# Patient Record
Sex: Male | Born: 1940 | Race: White | Hispanic: No | Marital: Married | Smoking: Former smoker
Health system: Southern US, Community
[De-identification: ages and names within clinical notes are randomized; demographics above are authoritative.]

## PROBLEM LIST (undated history)

## (undated) DIAGNOSIS — R0602 Shortness of breath: Secondary | ICD-10-CM

## (undated) DIAGNOSIS — I4891 Unspecified atrial fibrillation: Secondary | ICD-10-CM

## (undated) DIAGNOSIS — F329 Major depressive disorder, single episode, unspecified: Secondary | ICD-10-CM

## (undated) DIAGNOSIS — G893 Neoplasm related pain (acute) (chronic): Secondary | ICD-10-CM

## (undated) DIAGNOSIS — Z87891 Personal history of nicotine dependence: Secondary | ICD-10-CM

## (undated) DIAGNOSIS — J910 Malignant pleural effusion: Secondary | ICD-10-CM

## (undated) DIAGNOSIS — G629 Polyneuropathy, unspecified: Secondary | ICD-10-CM

## (undated) DIAGNOSIS — K55069 Acute infarction of intestine, part and extent unspecified: Secondary | ICD-10-CM

## (undated) DIAGNOSIS — J189 Pneumonia, unspecified organism: Secondary | ICD-10-CM

## (undated) DIAGNOSIS — Z789 Other specified health status: Secondary | ICD-10-CM

## (undated) DIAGNOSIS — K760 Fatty (change of) liver, not elsewhere classified: Secondary | ICD-10-CM

## (undated) DIAGNOSIS — C220 Liver cell carcinoma: Secondary | ICD-10-CM

## (undated) DIAGNOSIS — R188 Other ascites: Secondary | ICD-10-CM

## (undated) DIAGNOSIS — M1990 Unspecified osteoarthritis, unspecified site: Secondary | ICD-10-CM

## (undated) DIAGNOSIS — H35033 Hypertensive retinopathy, bilateral: Secondary | ICD-10-CM

## (undated) DIAGNOSIS — C3492 Malignant neoplasm of unspecified part of left bronchus or lung: Secondary | ICD-10-CM

## (undated) DIAGNOSIS — Z86010 Personal history of colonic polyps: Secondary | ICD-10-CM

## (undated) DIAGNOSIS — J181 Lobar pneumonia, unspecified organism: Secondary | ICD-10-CM

## (undated) DIAGNOSIS — I639 Cerebral infarction, unspecified: Secondary | ICD-10-CM

## (undated) DIAGNOSIS — I10 Essential (primary) hypertension: Secondary | ICD-10-CM

## (undated) DIAGNOSIS — R011 Cardiac murmur, unspecified: Secondary | ICD-10-CM

## (undated) DIAGNOSIS — F32A Depression, unspecified: Secondary | ICD-10-CM

## (undated) DIAGNOSIS — K219 Gastro-esophageal reflux disease without esophagitis: Secondary | ICD-10-CM

## (undated) DIAGNOSIS — M545 Low back pain, unspecified: Secondary | ICD-10-CM

## (undated) DIAGNOSIS — K5900 Constipation, unspecified: Secondary | ICD-10-CM

## (undated) DIAGNOSIS — Z8639 Personal history of other endocrine, nutritional and metabolic disease: Secondary | ICD-10-CM

## (undated) DIAGNOSIS — E785 Hyperlipidemia, unspecified: Secondary | ICD-10-CM

## (undated) DIAGNOSIS — R768 Other specified abnormal immunological findings in serum: Secondary | ICD-10-CM

## (undated) DIAGNOSIS — H3530 Unspecified macular degeneration: Secondary | ICD-10-CM

## (undated) DIAGNOSIS — E11319 Type 2 diabetes mellitus with unspecified diabetic retinopathy without macular edema: Secondary | ICD-10-CM

## (undated) DIAGNOSIS — K5730 Diverticulosis of large intestine without perforation or abscess without bleeding: Secondary | ICD-10-CM

## (undated) DIAGNOSIS — I499 Cardiac arrhythmia, unspecified: Secondary | ICD-10-CM

## (undated) HISTORY — DX: Malignant neoplasm of unspecified part of left bronchus or lung: C34.92

## (undated) HISTORY — DX: Other ascites: R18.8

## (undated) HISTORY — DX: Pneumonia, unspecified organism: J18.9

## (undated) HISTORY — DX: Low back pain: M54.5

## (undated) HISTORY — DX: Personal history of colonic polyps: Z86.010

## (undated) HISTORY — DX: Personal history of other endocrine, nutritional and metabolic disease: Z86.39

## (undated) HISTORY — DX: Cardiac murmur, unspecified: R01.1

## (undated) HISTORY — DX: Lobar pneumonia, unspecified organism: J18.1

## (undated) HISTORY — DX: Neoplasm related pain (acute) (chronic): G89.3

## (undated) HISTORY — DX: Fatty (change of) liver, not elsewhere classified: K76.0

## (undated) HISTORY — DX: Hyperlipidemia, unspecified: E78.5

## (undated) HISTORY — DX: Other specified abnormal immunological findings in serum: R76.8

## (undated) HISTORY — DX: Diverticulosis of large intestine without perforation or abscess without bleeding: K57.30

## (undated) HISTORY — DX: Acute infarction of intestine, part and extent unspecified: K55.069

## (undated) HISTORY — DX: Unspecified macular degeneration: H35.30

## (undated) HISTORY — DX: Essential (primary) hypertension: I10

## (undated) HISTORY — DX: Liver cell carcinoma: C22.0

## (undated) HISTORY — DX: Malignant pleural effusion: J91.0

## (undated) HISTORY — DX: Type 2 diabetes mellitus with unspecified diabetic retinopathy without macular edema: E11.319

## (undated) HISTORY — DX: Personal history of nicotine dependence: Z87.891

## (undated) HISTORY — DX: Other specified health status: Z78.9

## (undated) HISTORY — DX: Low back pain, unspecified: M54.50

## (undated) HISTORY — DX: Cerebral infarction, unspecified: I63.9

## (undated) HISTORY — DX: Hypertensive retinopathy, bilateral: H35.033

---

## 1999-12-24 ENCOUNTER — Encounter: Payer: Self-pay | Admitting: Family Medicine

## 2000-02-29 DIAGNOSIS — K760 Fatty (change of) liver, not elsewhere classified: Secondary | ICD-10-CM

## 2000-02-29 HISTORY — DX: Fatty (change of) liver, not elsewhere classified: K76.0

## 2001-02-20 ENCOUNTER — Encounter: Payer: Self-pay | Admitting: Family Medicine

## 2001-10-23 ENCOUNTER — Encounter: Payer: Self-pay | Admitting: Family Medicine

## 2001-10-23 LAB — CONVERTED CEMR LAB: Hgb A1c MFr Bld: 5.4 %

## 2002-09-22 ENCOUNTER — Encounter: Payer: Self-pay | Admitting: Family Medicine

## 2002-12-23 HISTORY — PX: COLONOSCOPY: SHX174

## 2003-04-23 ENCOUNTER — Encounter: Payer: Self-pay | Admitting: Family Medicine

## 2003-06-23 ENCOUNTER — Ambulatory Visit (HOSPITAL_COMMUNITY): Admission: RE | Admit: 2003-06-23 | Discharge: 2003-06-23 | Payer: Self-pay | Admitting: Gastroenterology

## 2004-06-22 ENCOUNTER — Encounter: Payer: Self-pay | Admitting: Family Medicine

## 2004-06-22 LAB — CONVERTED CEMR LAB
Hgb A1c MFr Bld: 6.1 %
Hgb A1c MFr Bld: 6.1 %

## 2004-10-23 ENCOUNTER — Encounter: Payer: Self-pay | Admitting: Family Medicine

## 2004-10-23 LAB — CONVERTED CEMR LAB
Microalbumin U total vol: 0.2 mg/L
PSA: 0.71 ng/mL
PSA: 0.71 ng/mL

## 2004-11-09 ENCOUNTER — Ambulatory Visit: Payer: Self-pay | Admitting: Family Medicine

## 2004-11-13 ENCOUNTER — Ambulatory Visit: Payer: Self-pay | Admitting: Family Medicine

## 2004-12-07 ENCOUNTER — Ambulatory Visit: Payer: Self-pay | Admitting: Internal Medicine

## 2004-12-21 ENCOUNTER — Ambulatory Visit: Payer: Self-pay | Admitting: Family Medicine

## 2005-02-05 ENCOUNTER — Ambulatory Visit: Payer: Self-pay | Admitting: Family Medicine

## 2005-03-19 ENCOUNTER — Ambulatory Visit: Payer: Self-pay | Admitting: Family Medicine

## 2005-10-23 ENCOUNTER — Encounter: Payer: Self-pay | Admitting: Family Medicine

## 2005-10-24 ENCOUNTER — Ambulatory Visit: Payer: Self-pay | Admitting: Family Medicine

## 2005-11-01 ENCOUNTER — Ambulatory Visit: Payer: Self-pay | Admitting: Family Medicine

## 2005-11-05 ENCOUNTER — Ambulatory Visit: Payer: Self-pay | Admitting: Family Medicine

## 2006-01-23 ENCOUNTER — Encounter: Payer: Self-pay | Admitting: Family Medicine

## 2006-02-07 ENCOUNTER — Ambulatory Visit: Payer: Self-pay | Admitting: Family Medicine

## 2006-02-14 ENCOUNTER — Ambulatory Visit: Payer: Self-pay | Admitting: Internal Medicine

## 2006-05-02 ENCOUNTER — Ambulatory Visit: Payer: Self-pay | Admitting: Family Medicine

## 2006-08-08 ENCOUNTER — Ambulatory Visit: Payer: Self-pay | Admitting: Family Medicine

## 2006-08-12 ENCOUNTER — Ambulatory Visit: Payer: Self-pay | Admitting: Family Medicine

## 2006-10-01 ENCOUNTER — Ambulatory Visit: Payer: Self-pay | Admitting: Family Medicine

## 2006-10-23 ENCOUNTER — Encounter: Payer: Self-pay | Admitting: Family Medicine

## 2006-10-23 LAB — CONVERTED CEMR LAB
Hgb A1c MFr Bld: 5.4 %
Microalbumin U total vol: 16.5 mg/L
PSA: 0.9 ng/mL

## 2006-10-31 ENCOUNTER — Ambulatory Visit: Payer: Self-pay | Admitting: Internal Medicine

## 2006-11-07 ENCOUNTER — Ambulatory Visit: Payer: Self-pay | Admitting: Family Medicine

## 2007-04-16 ENCOUNTER — Ambulatory Visit: Payer: Self-pay | Admitting: Internal Medicine

## 2007-04-28 ENCOUNTER — Telehealth (INDEPENDENT_AMBULATORY_CARE_PROVIDER_SITE_OTHER): Payer: Self-pay | Admitting: Internal Medicine

## 2007-06-09 ENCOUNTER — Encounter: Payer: Self-pay | Admitting: Family Medicine

## 2007-06-09 DIAGNOSIS — E099 Drug or chemical induced diabetes mellitus without complications: Secondary | ICD-10-CM

## 2007-06-09 DIAGNOSIS — Z87891 Personal history of nicotine dependence: Secondary | ICD-10-CM

## 2007-06-09 DIAGNOSIS — K5730 Diverticulosis of large intestine without perforation or abscess without bleeding: Secondary | ICD-10-CM | POA: Insufficient documentation

## 2007-06-09 DIAGNOSIS — K649 Unspecified hemorrhoids: Secondary | ICD-10-CM

## 2007-06-09 DIAGNOSIS — T380X5A Adverse effect of glucocorticoids and synthetic analogues, initial encounter: Secondary | ICD-10-CM

## 2007-06-09 DIAGNOSIS — K7689 Other specified diseases of liver: Secondary | ICD-10-CM

## 2007-06-09 DIAGNOSIS — I10 Essential (primary) hypertension: Secondary | ICD-10-CM | POA: Insufficient documentation

## 2007-06-09 DIAGNOSIS — E785 Hyperlipidemia, unspecified: Secondary | ICD-10-CM

## 2007-06-11 ENCOUNTER — Ambulatory Visit: Payer: Self-pay | Admitting: Family Medicine

## 2007-11-06 ENCOUNTER — Ambulatory Visit: Payer: Self-pay | Admitting: Family Medicine

## 2007-11-08 LAB — CONVERTED CEMR LAB
ALT: 47 units/L (ref 0–53)
Bilirubin, Direct: 0.1 mg/dL (ref 0.0–0.3)
CO2: 32 meq/L (ref 19–32)
Calcium: 9.1 mg/dL (ref 8.4–10.5)
Cholesterol: 160 mg/dL (ref 0–200)
GFR calc Af Amer: 124 mL/min
Glucose, Bld: 138 mg/dL — ABNORMAL HIGH (ref 70–99)
Microalb Creat Ratio: 21 mg/g (ref 0.0–30.0)
Total CHOL/HDL Ratio: 3.1
Total Protein: 6.6 g/dL (ref 6.0–8.3)
Triglycerides: 103 mg/dL (ref 0–149)

## 2007-11-10 ENCOUNTER — Ambulatory Visit: Payer: Self-pay | Admitting: Family Medicine

## 2007-12-30 ENCOUNTER — Telehealth: Payer: Self-pay | Admitting: Family Medicine

## 2008-04-22 ENCOUNTER — Ambulatory Visit: Payer: Self-pay | Admitting: Family Medicine

## 2008-08-17 ENCOUNTER — Encounter: Payer: Self-pay | Admitting: Family Medicine

## 2008-09-19 ENCOUNTER — Ambulatory Visit: Payer: Self-pay | Admitting: Family Medicine

## 2008-11-01 ENCOUNTER — Telehealth: Payer: Self-pay | Admitting: Family Medicine

## 2008-12-23 DIAGNOSIS — R011 Cardiac murmur, unspecified: Secondary | ICD-10-CM

## 2008-12-23 HISTORY — DX: Cardiac murmur, unspecified: R01.1

## 2008-12-26 ENCOUNTER — Ambulatory Visit: Payer: Self-pay | Admitting: Family Medicine

## 2008-12-26 LAB — CONVERTED CEMR LAB
ALT: 77 units/L — ABNORMAL HIGH (ref 0–53)
Alkaline Phosphatase: 57 units/L (ref 39–117)
BUN: 12 mg/dL (ref 6–23)
CO2: 32 meq/L (ref 19–32)
Chloride: 103 meq/L (ref 96–112)
Creatinine,U: 110.8 mg/dL
Direct LDL: 160.5 mg/dL
Eosinophils Relative: 5.9 % — ABNORMAL HIGH (ref 0.0–5.0)
Glucose, Bld: 163 mg/dL — ABNORMAL HIGH (ref 70–99)
HCT: 50.6 % (ref 39.0–52.0)
HDL: 44.2 mg/dL (ref >39.0)
Hgb A1c MFr Bld: 6.1 % — ABNORMAL HIGH (ref 4.6–6.0)
Lymphocytes Relative: 25.1 % (ref 12.0–46.0)
Microalb Creat Ratio: 15.3 mg/g (ref 0.0–30.0)
Microalb, Ur: 1.7 mg/dL (ref 0.0–1.9)
Monocytes Relative: 8.1 % (ref 3.0–12.0)
Neutrophils Relative %: 60.9 % (ref 43.0–77.0)
Platelets: 131 10*3/uL — ABNORMAL LOW (ref 150–400)
Potassium: 4.7 meq/L (ref 3.5–5.1)
Total CHOL/HDL Ratio: 5.4
Triglycerides: 191 mg/dL — ABNORMAL HIGH (ref 0–149)
VLDL: 38 mg/dL (ref 0–40)
WBC: 5.5 10*3/uL (ref 4.5–10.5)

## 2008-12-28 ENCOUNTER — Ambulatory Visit: Payer: Self-pay | Admitting: Family Medicine

## 2008-12-28 LAB — HM DIABETES FOOT EXAM

## 2008-12-29 ENCOUNTER — Ambulatory Visit: Payer: Self-pay | Admitting: Family Medicine

## 2009-02-21 ENCOUNTER — Ambulatory Visit: Payer: Self-pay | Admitting: Family Medicine

## 2009-02-26 LAB — CONVERTED CEMR LAB
Albumin: 4.1 g/dL (ref 3.5–5.2)
Alkaline Phosphatase: 58 units/L (ref 39–117)
BUN: 19 mg/dL (ref 6–23)
Creatinine, Ser: 0.9 mg/dL (ref 0.4–1.5)
GFR calc Af Amer: 108 mL/min
Glucose, Bld: 128 mg/dL — ABNORMAL HIGH (ref 70–99)
Potassium: 4.6 meq/L (ref 3.5–5.1)
Total Protein: 6.3 g/dL (ref 6.0–8.3)

## 2009-02-28 ENCOUNTER — Ambulatory Visit: Payer: Self-pay | Admitting: Family Medicine

## 2009-04-03 ENCOUNTER — Ambulatory Visit: Payer: Self-pay | Admitting: Family Medicine

## 2009-04-03 LAB — CONVERTED CEMR LAB: ALT: 28 units/L (ref 0–53)

## 2009-05-23 ENCOUNTER — Ambulatory Visit: Payer: Self-pay | Admitting: Family Medicine

## 2009-05-23 LAB — CONVERTED CEMR LAB
AST: 26 units/L (ref 0–37)
Cholesterol: 137 mg/dL (ref 0–200)
LDL Cholesterol: 72 mg/dL (ref 0–99)
Total CHOL/HDL Ratio: 3

## 2009-05-29 ENCOUNTER — Ambulatory Visit: Payer: Self-pay | Admitting: Family Medicine

## 2009-06-07 ENCOUNTER — Encounter: Payer: Self-pay | Admitting: Family Medicine

## 2009-06-07 ENCOUNTER — Ambulatory Visit: Payer: Self-pay

## 2009-09-27 ENCOUNTER — Ambulatory Visit: Payer: Self-pay | Admitting: Family Medicine

## 2009-11-01 ENCOUNTER — Telehealth: Payer: Self-pay | Admitting: Family Medicine

## 2009-12-29 ENCOUNTER — Ambulatory Visit: Payer: Self-pay | Admitting: Family Medicine

## 2009-12-30 LAB — CONVERTED CEMR LAB
Alkaline Phosphatase: 58 units/L (ref 39–117)
Bilirubin, Direct: 0.2 mg/dL (ref 0.0–0.3)
Calcium: 8.8 mg/dL (ref 8.4–10.5)
Eosinophils Relative: 5.9 % — ABNORMAL HIGH (ref 0.0–5.0)
GFR calc non Af Amer: 101.84 mL/min (ref >60)
HDL: 57.8 mg/dL (ref >39.00)
Hgb A1c MFr Bld: 6.2 % (ref 4.6–6.5)
LDL Cholesterol: 74 mg/dL (ref 0–99)
Microalb Creat Ratio: 62.8 mg/g — ABNORMAL HIGH (ref 0.0–30.0)
Microalb, Ur: 2.3 mg/dL — ABNORMAL HIGH (ref 0.0–1.9)
Monocytes Absolute: 0.4 10*3/uL (ref 0.1–1.0)
Monocytes Relative: 8.4 % (ref 3.0–12.0)
Neutrophils Relative %: 62.3 % (ref 43.0–77.0)
PSA: 0.97 ng/mL (ref 0.10–4.00)
Platelets: 116 10*3/uL — ABNORMAL LOW (ref 150.0–400.0)
Sodium: 141 meq/L (ref 135–145)
Total CHOL/HDL Ratio: 3
VLDL: 15.6 mg/dL (ref 0.0–40.0)
WBC: 5.3 10*3/uL (ref 4.5–10.5)

## 2010-02-28 ENCOUNTER — Ambulatory Visit: Payer: Self-pay | Admitting: Family Medicine

## 2010-03-06 ENCOUNTER — Telehealth: Payer: Self-pay | Admitting: Family Medicine

## 2010-07-31 ENCOUNTER — Encounter (INDEPENDENT_AMBULATORY_CARE_PROVIDER_SITE_OTHER): Payer: Self-pay | Admitting: *Deleted

## 2010-08-10 ENCOUNTER — Telehealth: Payer: Self-pay | Admitting: Family Medicine

## 2010-08-10 ENCOUNTER — Encounter: Payer: Self-pay | Admitting: Family Medicine

## 2010-10-03 ENCOUNTER — Ambulatory Visit: Payer: Self-pay | Admitting: Family Medicine

## 2010-10-31 ENCOUNTER — Ambulatory Visit: Payer: Self-pay | Admitting: Family Medicine

## 2011-01-22 NOTE — Progress Notes (Signed)
Summary: regarding drug interaction  Phone Note From Pharmacy   Caller: [facility] Summary of Call: Form is on your desk regarding drug interaction between simvastatin and amlodipine. Initial call taken by: Lowella Petties CMA,  August 10, 2010 10:12 AM  Follow-up for Phone Call        signed, pleas send back.  Follow-up by: Crawford Givens MD,  August 10, 2010 10:37 AM  Additional Follow-up for Phone Call Additional follow up Details #1::        Faxed  Additional Follow-up by: Delilah Shan CMA Duncan Dull),  August 10, 2010 3:56 PM

## 2011-01-22 NOTE — Assessment & Plan Note (Signed)
Summary: F/U/CLE   Vital Signs:  Patient profile:   70 year old male Weight:      197.75 pounds Temp:     98.6 degrees F oral Pulse rate:   68 / minute Pulse rhythm:   regular BP sitting:   138 / 84  (left arm) Cuff size:   large  Vitals Entered By: Sydell Axon LPN (October 31, 2010 9:10 AM) CC: follow-up visit   History of Present Illness: Pt here for routine followup. He has some mild sore throat today. He denies fever or chills, rhinitis of headache.  He also has muscle aches. He is on Simvastatin 80mg  so will have him decrease. He has "trigger points" in his back...he has points in his lower back that lock up. He uses mustard regularly.  His sugar has been stable lately.   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 Take 1 Tablet By Mouth Once A Day 2)  Adult Aspirin Ec Low Strength 81 Mg  Tbec (Aspirin) .Marland Kitchen.. 1 By Mouth Daily 3)  Norvasc 5 Mg  Tabs (Amlodipine Besylate) .Marland Kitchen.. 1 By Mouth At Bedtime 4)  Eye Vitamins   Caps (Multiple Vitamins-Minerals) .... Due To Macular Degeneration 5)  One-Daily Multivitamins   Tabs (Multiple Vitamin) .Marland Kitchen.. 1 Daily By Mouth 6)  Fish Oil 1000 Mg Caps (Omega-3 Fatty Acids) .... Daily By Mouth 7)  Bl Vitamin C 500 Mg  Tabs (Ascorbic Acid) 8)  Anusol-Hc 25 Mg Supp (Hydrocortisone Acetate) .... One Supp Pr Three Times A Day For One Week, Then As Needed. 9)  Ibuprofen 800 Mg Tabs (Ibuprofen) .Marland Kitchen.. 1 Tablet With Meals As Needed For Pain (Use Sparingly) 10)  Lisinopril 40 Mg Tabs  (Lisinopril) .... One Tab By Mouth Once A Day. 11)  Simvastatin 80 Mg Tabs (Simvastatin) .... One Tab By Mouth At Night. 12)  Methocarbamol 500 Mg Tabs (Methocarbamol) .... Take One By Mouth Two Times A Day As Needed 13)  Vitamin B-12 Cr 2000 Mcg Cr-Tabs (Cyanocobalamin) .... Take One By Mouth Daily 14)  Fluticasone Propionate 50 Mcg/act Susp (Fluticasone Propionate) .... Use 2 Sprays in Each Nostril Daily  Allergies: 1)  ! Atacand 2)  ! Cardura 3)  ! Nifedipine 4)  ! Cardizem 5)  Crestor (Rosuvastatin Calcium)  Physical Exam  General:  Well-developed,well-nourished,in no acute distress; alert,appropriate and cooperative throughout examination Head:  Normocephalic and atraumatic without obvious abnormalities. No apparent alopecia or balding. Mild balding on top. Hair in ponytail today. Eyes:  Conjunctiva clear bilaterally.  Ears:  External ear exam shows no significant lesions or deformities.  Otoscopic examination reveals clear canals, tympanic membranes are intact bilaterally without bulging, retraction, inflammation or discharge. Hearing is grossly normal bilaterally. Nose:  External nasal examination shows no deformity or inflammation. Nasal mucosa are pink and moist without lesions or exudates. Mouth:  Oral mucosa and oropharynx without lesions or exudates.  Teeth in good repair. Throat benign. Neck:  No deformities, masses, or tenderness noted. Lungs:  Normal respiratory effort, chest expands symmetrically. Lungs are clear to auscultation,  no crackles or wheezes. Heart:  Normal rate and regular rhythm. S1 and S2 normal without gallop, click, rub or other extra sounds. II/VI sys murmur heard best left sternal border.   Impression & Recommendations:  Problem # 1:  HYPERLIPIDEMIA (ICD-272.4) Assessment Unchanged  Decrease Simvastatin to 1/2 tab at night. "Boy I hate to decrease that med as my chol dropped like a rock when I started that." Add Co Q 10. See instructions. His  updated medication list for this problem includes:    Simvastatin 80 Mg Tabs (Simvastatin) .Marland Kitchen... 1/2  tab by mouth at night.  Labs Reviewed: SGOT: 46 (12/29/2009)   SGPT: 48 (12/29/2009)   HDL:57.80 (12/29/2009), 54.10 (05/23/2009)  LDL:74 (12/29/2009), 72 (05/23/2009)  Chol:147 (12/29/2009), 137 (05/23/2009)  Trig:78.0 (12/29/2009), 53.0 (05/23/2009)  Problem # 2:  HYPERTENSION (ICD-401.9) Assessment: Unchanged Slightly elevated today but in the realm of stability...will follow. His updated medication list for this problem includes:    Atenolol 50 Mg Tabs (Atenolol) .Marland Kitchen... Take 1 tablet by mouth once a day    Norvasc 5 Mg Tabs (Amlodipine besylate) .Marland Kitchen... 1 by mouth at bedtime    Lisinopril 40 Mg Tabs (Lisinopril) ..... One tab by mouth once a day.  BP today: 138/84 Prior BP: 130/80 (02/28/2010)  Labs Reviewed: K+: 5.1 (12/29/2009) Creat: : 0.8 (12/29/2009)   Chol: 147 (12/29/2009)   HDL: 57.80 (12/29/2009)   LDL: 74 (12/29/2009)   TG: 78.0 (12/29/2009)  Problem # 3:  PHARYNGITIS-ACUTE (ICD-462) Assessment: New  Looks viral. Push fluids and use Guaif as needed. RTC if sxs worsen. His updated medication list for this problem includes:    Adult Aspirin Ec Low Strength 81 Mg Tbec (Aspirin) .Marland Kitchen... 1 by mouth daily    Ibuprofen 800 Mg Tabs (Ibuprofen) .Marland Kitchen... 1 tablet with meals as needed for pain (use sparingly)  Problem # 4:  DIABETES MELLITUS, TYPE II (ICD-250.00) Assessment: Unchanged  Has done well in the  past and continues with usual routine and diet. Encouraged to cont. His updated medication list for this problem includes:    Adult Aspirin Ec Low Strength 81 Mg Tbec (Aspirin) .Marland Kitchen... 1 by mouth daily    Lisinopril 40 Mg Tabs (Lisinopril) ..... One tab by mouth once a day.  Labs Reviewed: Creat: 0.8 (12/29/2009)   Microalbumin: 16.5 (10/23/2006)  Last Eye Exam: normal (10/11/2008) Reviewed HgBA1c results: 6.2 (12/29/2009)  6.1 (12/26/2008)  Complete Medication List: 1)   Atenolol 50 Mg Tabs (Atenolol) .... Take 1 tablet by mouth once a day 2)  Adult Aspirin Ec Low Strength 81 Mg Tbec (Aspirin) .Marland Kitchen.. 1 by mouth daily 3)  Norvasc 5 Mg Tabs (Amlodipine besylate) .Marland Kitchen.. 1 by mouth at bedtime 4)  Eye Vitamins Caps (Multiple vitamins-minerals) .... Due to macular degeneration 5)  One-daily Multivitamins Tabs (Multiple vitamin) .Marland Kitchen.. 1 daily by mouth 6)  Fish Oil 1000 Mg Caps (Omega-3 fatty acids) .... Daily by mouth 7)  Bl Vitamin C 500 Mg Tabs (Ascorbic acid) .... Take one by mouth daily 8)  Anusol-hc 25 Mg Supp (Hydrocortisone acetate) .... One supp pr three times a day for one week, then as needed. 9)  Ibuprofen 800 Mg Tabs (Ibuprofen) .Marland Kitchen.. 1 tablet with meals as needed for pain (use sparingly) 10)  Lisinopril 40 Mg Tabs (Lisinopril) .... One tab by mouth once a day. 11)  Simvastatin 80 Mg Tabs (Simvastatin) .... 1/2  tab by mouth at night. 12)  Methocarbamol 500 Mg Tabs (Methocarbamol) .... Take one by mouth two times a  day as needed 13)  Vitamin B-12 Cr 2000 Mcg Cr-tabs (Cyanocobalamin) .... Take one by mouth daily 14)  Fluticasone Propionate 50 Mcg/act Susp (Fluticasone propionate) .... Use 2 sprays in each nostril daily  Patient Instructions: 1)  Decrease Simvastatin to 1/2 tab at night. 2)  Add Co Q 10 100mg -200mg  daily for muscles. 3)  Recheck labs fasting prior to Comp Exam in Mar.   Orders Added: 1)  Est. Patient Level IV [91478]    Current Allergies (reviewed today): ! ATACAND ! CARDURA ! NIFEDIPINE ! CARDIZEM CRESTOR (ROSUVASTATIN CALCIUM)

## 2011-01-22 NOTE — Assessment & Plan Note (Signed)
Summary: CPX/Rescheduled from 01/08/10   Vital Signs:  Patient profile:   70 year old male Weight:      202.25 pounds BMI:     30.86 Temp:     98.4 degrees F oral Pulse rate:   76 / minute Pulse rhythm:   regular BP sitting:   130 / 80  (left arm) Cuff size:   large  Vitals Entered By: Sydell Axon LPN (February 28, 5642 1:54 PM) CC: 30 Minute checkup, declined hemoccult cards, had a colonoscopy ? over 5 years ago and was told would not need another one for 10 years   History of Present Illness: Pt here for Comp Exam. His back is bothering him about a week. It started mildly the first of the year.   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 Take 1 Tablet By Mouth Once A Day 2)  Adult Aspirin Ec Low Strength 81 Mg  Tbec (Aspirin) .Marland Kitchen.. 1 By Mouth Daily 3)  Norvasc 5 Mg  Tabs (Amlodipine Besylate) .Marland Kitchen.. 1 By Mouth At Bedtime 4)  Eye Vitamins   Caps (Multiple Vitamins-Minerals) .... Due To Macular Degeneration 5)  One-Daily Multivitamins   Tabs (Multiple Vitamin) .Marland Kitchen.. 1 Daily By Mouth 6)  Fish Oil 1000 Mg Caps (Omega-3 Fatty Acids) .... Daily By Mouth 7)  Bl Vitamin C 500 Mg  Tabs (Ascorbic Acid) 8)  Anusol-Hc 25 Mg Supp (Hydrocortisone Acetate) .... One Supp Pr Three Times A Day For One Week, Then As Needed. 9)  Ibuprofen 800 Mg Tabs (Ibuprofen) .Marland Kitchen.. 1 Tablet With Meals As Needed Pain 10)  Lisinopril 40 Mg Tabs (Lisinopril) .... One Tab By Mouth Once A Day. 11)  Zyrtec Allergy 10 Mg  Tabs (Cetirizine Hcl) .Marland Kitchen.. 1 Daily For Hives 12)  Simvastatin 80 Mg Tabs (Simvastatin) .... One Tab By Mouth At Night. 13)  Methocarbamol 500 Mg Tabs (Methocarbamol) .... Take One By Mouth Two Times A Day As Needed  Allergies: 1)  ! Atacand 2)  ! Cardura 3)  ! Nifedipine 4)  ! Cardizem 5)  Crestor (Rosuvastatin Calcium)  Past History:  Past Medical History: Last updated: 06/09/2007 Diabetes mellitus, type II Diverticulosis, colon Hyperlipidemia Hypertension  Past Surgical History: Last updated: 06/07/2009 03/16/98      L/S x-rays, slight degen. changes o/w nml. 02/02/01      Cerv. x-rays, DDD  C4-5, C5-6 mild bilat. compr 02/29/00        Abd. U/S, fatty liver, (-) gallstones 08/24/02        Echo   TRIV MR , mildly increased LA, nml LVF 06/23/03        Colonoscopy nml (Dr Laural Benes)    10 yrs 06/07/09      Echo   Triv MR, TR, PR  Family History: Last updated: 12/28/2008 Father:  dec 92 Prostate Mother: dec 87 pneumonia in [place] feeding tube, multiple mini strokes, HTN Siblings: None CV:  (+) MGF died MI HBP:  (+) self, mother DM:  (+) self Depression:  (+)  mother's side Stroke:  (+) MGM  Social History: Last updated: 12/28/2008 Marital Status: Married, lives with wife Children: 2 Occupation: [employer], picks up [employer]. [employer], flunked eye exam, quit Former Smoker, 3 PPD x 30 years 90PYH, quit 15 years ago ('90) Alcohol use-yes, 3-4 wine/day  - (8/03 - 2 beers, 2 wines, 2 brandies/day) Drug use-no works at [employer]  Risk Factors: Alcohol Use: 3 (02/28/2010) Caffeine Use: 4 (02/28/2010) Exercise: yes (02/28/2010)  Risk Factors: Smoking Status: quit (02/28/2010) Passive Smoke Exposure: no (02/28/2010)  Review of Systems General:  Complains of sweats; denies chills, fatigue, fever, weakness, and weight loss; night. Eyes:  Denies blurring, discharge, eye pain, and halos. ENT:  Denies decreased hearing, earache, and ringing in ears. CV:  Denies chest pain or discomfort,  fainting, fatigue, palpitations, shortness of breath with exertion, swelling of feet, and swelling of hands. Resp:  Denies cough, shortness of breath, and wheezing; haqs constant mucous . GI:  Denies abdominal pain, bloody stools, change in bowel habits, constipation, dark tarry stools, diarrhea, indigestion, loss of appetite, nausea, vomiting, vomiting blood, and yellowish skin color. GU:  Denies decreased libido, discharge, dysuria, incontinence, nocturia, and urinary frequency. MS:  Denies joint pain, low back pain, cramps, and stiffness. Derm:  Complains of poor wound healing; denies dryness, itching, and rash. Neuro:  Denies numbness, poor balance, tingling, and tremors.  Physical Exam  General:  Well-developed,well-nourished,in no acute distress; alert,appropriate and cooperative throughout examination Head:  Normocephalic and atraumatic without obvious abnormalities. No apparent alopecia or balding. Mild balding on top. Eyes:  Conjunctiva clear bilaterally.  Ears:  External ear exam shows no significant lesions or deformities.  Otoscopic examination reveals clear canals, tympanic membranes are intact bilaterally without bulging, retraction, inflammation or discharge. Hearing is grossly normal bilaterally. Nose:  External nasal examination shows no deformity or inflammation. Nasal mucosa are pink and moist without lesions or exudates. Mouth:  Oral mucosa and oropharynx without lesions or exudates.  Teeth in good repair. Neck:  No deformities, masses, or tenderness noted. Chest Wall:  No deformities, masses, tenderness or gynecomastia noted. Breasts:  No masses or gynecomastia noted Lungs:  Normal respiratory effort, chest expands symmetrically. Lungs are clear to auscultation, no crackles or wheezes. Heart:  Normal rate and regular rhythm. S1 and S2 normal without gallop, click, rub or other extra sounds. II/VI sys murmur heard best left sternal border. Abdomen:  Bowel sounds  positive,abdomen soft and non-tender without masses, organomegaly or hernias noted. Rectal:  No external abnormalities noted. Normal sphincter tone. No rectal masses or tenderness. G neg. Genitalia:  Testes bilaterally descended without nodularity, tenderness or masses. No scrotal masses or lesions. No penis lesions or urethral discharge. Prostate:  Prostate gland firm and smooth, no enlargement, nodularity, tenderness, mass, asymmetry or induration. 20-30gms. Msk:  No deformity or scoliosis noted of thoracic or lumbar spine.  Mild discomfort of the L/S area with movement, lying on table and getting up esp. Pulses:  R and L carotid,radial,femoral,dorsalis pedis and posterior tibial pulses are full and equal bilaterally Extremities:  No clubbing, cyanosis, edema, or deformity noted with normal full range of motion of all joints.   Neurologic:  No cranial nerve deficits noted. Station and gait are normal. Sensory, motor and coordinative functions appear intact. Skin:  Intact without suspicious lesions or rashes, sundanmage of trunk generally with Aks and Sks. Cervical Nodes:  No lymphadenopathy noted Inguinal Nodes:  No significant adenopathy Psych:  Cognition and judgment appear intact. Alert and cooperative with normal  attention span and concentration. No apparent delusions, illusions, hallucinations   Impression & Recommendations:  Problem # 1:  DIABETES MELLITUS, TYPE II (ICD-250.00) Assessment Unchanged Good control with acute elevation after the holidays. Encouraged to watch diet all the time. His updated medication list for this problem includes:    Adult Aspirin Ec Low Strength 81 Mg Tbec (Aspirin) .Marland Kitchen... 1 by mouth daily    Lisinopril 40 Mg Tabs (Lisinopril) ..... One tab by mouth once a day.  Labs Reviewed: Creat: 0.8 (12/29/2009)   Microalbumin: 16.5 (10/23/2006)  Last Eye Exam: normal (10/11/2008) Reviewed HgBA1c results: 6.2 (12/29/2009)  6.1 (12/26/2008)  Problem # 2:   SPECIAL SCREENING MALIGNANT NEOPLASM OF PROSTATE (ICD-V76.44) Assessment: Unchanged Stable exam and PSA  Problem # 3:  HEMORRHOIDS (ICD-455.6) Assessment: Unchanged SIgnificant size, but currently deflated.  Problem # 4:  FATTY LIVER DISEASE (ICD-571.8) Assessment: Unchanged Continues. Encouraged to cut down on wine intake.  Problem # 5:  HYPERTENSION (ICD-401.9) Assessment: Unchanged  His updated medication list for this problem includes:    Atenolol 50 Mg Tabs (Atenolol) .Marland Kitchen... Take 1 tablet by mouth once a day    Norvasc 5 Mg Tabs (Amlodipine besylate) .Marland Kitchen... 1 by mouth at bedtime    Lisinopril 40 Mg Tabs (Lisinopril) ..... One tab by mouth once a day.  BP today: 130/80 Prior BP: 130/80 (05/29/2009)  Labs Reviewed: K+: 5.1 (12/29/2009) Creat: : 0.8 (12/29/2009)   Chol: 147 (12/29/2009)   HDL: 57.80 (12/29/2009)   LDL: 74 (12/29/2009)   TG: 78.0 (12/29/2009)  Problem # 6:  HYPERLIPIDEMIA (ICD-272.4) Assessment: Improved Great nos. Cont Simva. His updated medication list for this problem includes:    Simvastatin 80 Mg Tabs (Simvastatin) ..... One tab by mouth at night.  Labs Reviewed: SGOT: 46 (12/29/2009)   SGPT: 48 (12/29/2009)   HDL:57.80 (12/29/2009), 54.10 (05/23/2009)  LDL:74 (12/29/2009), 72 (05/23/2009)  Chol:147 (12/29/2009), 137 (05/23/2009)  Trig:78.0 (12/29/2009), 53.0 (05/23/2009)  Problem # 7:  DIVERTICULOSIS, COLON (ICD-562.10) Assessment: Unchanged Discussed being seen for prolonged LLQ duiscomfort. Labs Reviewed: Hgb: 16.4 (12/29/2009)   Hct: 50.6 (12/29/2009)   WBC: 5.3 (12/29/2009)  Complete Medication List: 1)  Atenolol 50 Mg Tabs (Atenolol) .... Take 1 tablet by mouth once a day 2)  Adult Aspirin Ec Low Strength 81 Mg Tbec (Aspirin) .Marland Kitchen.. 1 by mouth daily 3)  Norvasc 5 Mg Tabs (Amlodipine besylate) .Marland Kitchen.. 1 by mouth at bedtime 4)  Eye Vitamins Caps (Multiple vitamins-minerals) .... Due to macular degeneration 5)  One-daily Multivitamins Tabs (Multiple  vitamin) .Marland Kitchen.. 1 daily by mouth 6)  Fish Oil 1000 Mg Caps (Omega-3 fatty acids) .... Daily by mouth 7)  Bl Vitamin C 500 Mg Tabs (Ascorbic acid) 8)  Anusol-hc 25 Mg Supp (Hydrocortisone acetate) .... One supp pr three times a day for one week, then as needed. 9)  Ibuprofen 800 Mg Tabs (Ibuprofen) .Marland Kitchen.. 1 tablet with meals as needed pain 10)  Lisinopril 40 Mg Tabs (Lisinopril) .... One tab by mouth once a day. 11)  Simvastatin 80 Mg Tabs (Simvastatin) .... One tab by mouth at night. 12)  Methocarbamol 500 Mg Tabs (Methocarbamol) .... Take one by mouth two times a day as needed 13)  Vitamin B-12 Cr 2000 Mcg Cr-tabs (Cyanocobalamin) .... Take one by mouth daily 14)  Fluticasone Propionate 50 Mcg/act Susp (Fluticasone propionate) .... Use 2 sprays in each nostril daily  Patient Instructions: 1)  RTC in Fall for recheck. Prescriptions: SIMVASTATIN 80 MG TABS (SIMVASTATIN) one tab by mouth at night.  #  90 x 3   Entered by:   Sydell Axon LPN   Authorized by:   Shaune Leeks MD   Signed by:   Sydell Axon LPN on 98/10/9146   Method used:   Print then Give to Patient   RxID:   [id number] LISINOPRIL 40 MG TABS (LISINOPRIL) one tab by mouth once a day.  #90 x 3   Entered by:   Sydell Axon LPN   Authorized by:   Shaune Leeks MD   Signed by:   Sydell Axon LPN on 96/29/5284   Method used:   Print then Give to Patient   RxID:   [id number] NORVASC 5 MG  TABS (AMLODIPINE BESYLATE) 1 by mouth at bedtime  #90 x 3   Entered by:   Sydell Axon LPN   Authorized by:   Shaune Leeks MD   Signed by:   Sydell Axon LPN on 40/34/7425   Method used:   Print then Give to Patient   RxID:   [id number] ATENOLOL 50 MG TABS (ATENOLOL) Take 1 tablet by mouth once a day  #90 x 3   Entered by:   Sydell Axon LPN   Authorized by:   Shaune Leeks MD   Signed by:   Sydell Axon LPN on 84/16/6063   Method used:   Print then Give to Patient   RxID:   [id number]   Current  Allergies (reviewed today): ! ATACAND ! CARDURA ! NIFEDIPINE ! CARDIZEM CRESTOR (ROSUVASTATIN CALCIUM)

## 2011-01-22 NOTE — Progress Notes (Signed)
Summary: refill request for robaxin, ibuprofen  Phone Note Refill Request Message from:  Fax from Pharmacy  Refills Requested: Medication #1:  METHOCARBAMOL 500 MG TABS Take one by mouth two times a day as needed   Last Refilled: 11/01/2009  Medication #2:  IBUPROFEN 800 MG TABS 1 tablet with meals as needed pain   Last Refilled: 11/10/110 Faxed request from [address].  [phone].  Initial call taken by: Lowella Petties CMA,  March 06, 2010 8:19 AM    New/Updated Medications: IBUPROFEN 800 MG TABS (IBUPROFEN) 1 tablet with meals as needed for pain (use sparingly) Prescriptions: METHOCARBAMOL 500 MG TABS (METHOCARBAMOL) Take one by mouth two times a day as needed  #60 x 0   Entered and Authorized by:   Shaune Leeks MD   Signed by:   Shaune Leeks MD on 03/06/2010   Method used:   Electronically to        [facility]  [address] [id number] (ret[facility])       [address]       [place], [place]  [address]       Ph: [phone] or [phone]       Fax: [phone]   RxID:   [id number] IBUPROFEN 800 MG TABS (IBUPROFEN) 1 tablet with meals as needed for pain (use sparingly)  #90 x 0   Entered and Authorized by:   Shaune Leeks MD   Signed by:   Shaune Leeks MD on 03/06/2010   Method used:   [facility]ically to        [facility]  [address] [id number]       [address]       [place], [place]  [address]       Ph: [phone] or [phone]       Fax: [phone]   RxID:   [id number]

## 2011-01-22 NOTE — Medication Information (Signed)
Summary: Interaction with Simvastatin & Amlodipine/[facility]  Interaction with Simvastatin & Amlodipine/[facility]   Imported By: Lanelle Bal 08/15/2010 12:53:42  _____________________________________________________________________  External Attachment:    Type:   Image     Comment:   External Document

## 2011-01-22 NOTE — Letter (Signed)
Summary: Nadara Eaton letter  [facility]  [address] [place], [place] [address]   Phone: [phone]  Fax: [phone]       07/31/2010 MRN: [id number]  Shawn Espinoza [address] [place], [place]  [address]  Dear Mr. [facility] [place], and [facility] announce the retirement of Arta Silence, M.D., from full-time practice at the [facility] office effective June 21, 2010 and his plans of returning part-time.  It is important to Dr. Hetty Ely and to our practice that you understand that [facility] has seven physicians in our office for your health care needs.  We will continue to offer the same exceptional care that you have today.    Dr. Hetty Ely has spoken to many of you about his plans for retirement and returning part-time in the fall.   We will continue to work with you through the transition to schedule appointments for you in the office and meet the high standards that [facility] is committed to.   Again, it is with great pleasure that we share the news that Dr. Hetty Ely will return to [facility] at [facility] in October of 2011 with a reduced schedule.    If you have any questions, or would like to request an appointment with one of our physicians, please call us at [phone] and press the option for Scheduling an appointment.  We take pleasure in providing you with excellent patient care and look forward to seeing you at your next office visit.  Our [facility] Physicians are:  Tillman Abide, M.D. Laurita Quint, M.D. Roxy Manns, M.D. Kerby Nora, M.D. Hannah Beat, M.D. Ruthe Mannan, M.D. We proudly welcomed Raechel Ache, M.D. and Eustaquio Boyden, M.D. to the practice in July/August 2011.  Sincerely,  [facility]

## 2011-01-22 NOTE — Assessment & Plan Note (Signed)
Summary: 4:00 FLU SHOT/CLE  Nurse Visit   Allergies: 1)  ! Atacand 2)  ! Cardura 3)  ! Nifedipine 4)  ! Cardizem 5)  Crestor (Rosuvastatin Calcium)  Orders Added: 1)  Admin 1st Vaccine [90471] 2)  Flu Vaccine 67yrs + [57322]  Flu Vaccine Consent Questions     Do you have a history of severe allergic reactions to this vaccine? no    Any prior history of allergic reactions to egg and/or gelatin? no    Do you have a sensitivity to the preservative Thimersol? no    Do you have a past history of Guillan-Barre Syndrome? no    Do you currently have an acute febrile illness? no    Have you ever had a severe reaction to latex? no    Vaccine information given and explained to patient? yes    Are you currently pregnant? no    Lot Number:[id number]   Exp Date:06/22/2011   Site Given  Left Deltoid IM

## 2011-02-20 ENCOUNTER — Other Ambulatory Visit: Payer: Self-pay | Admitting: Family Medicine

## 2011-02-20 ENCOUNTER — Encounter (INDEPENDENT_AMBULATORY_CARE_PROVIDER_SITE_OTHER): Payer: Self-pay | Admitting: *Deleted

## 2011-02-20 ENCOUNTER — Other Ambulatory Visit (INDEPENDENT_AMBULATORY_CARE_PROVIDER_SITE_OTHER): Payer: MEDICARE

## 2011-02-20 DIAGNOSIS — E785 Hyperlipidemia, unspecified: Secondary | ICD-10-CM

## 2011-02-20 DIAGNOSIS — K7689 Other specified diseases of liver: Secondary | ICD-10-CM

## 2011-02-20 DIAGNOSIS — I10 Essential (primary) hypertension: Secondary | ICD-10-CM

## 2011-02-20 DIAGNOSIS — E119 Type 2 diabetes mellitus without complications: Secondary | ICD-10-CM

## 2011-02-20 DIAGNOSIS — Z125 Encounter for screening for malignant neoplasm of prostate: Secondary | ICD-10-CM

## 2011-02-20 DIAGNOSIS — K5730 Diverticulosis of large intestine without perforation or abscess without bleeding: Secondary | ICD-10-CM

## 2011-02-20 LAB — CBC WITH DIFFERENTIAL/PLATELET
Basophils Absolute: 0 10*3/uL (ref 0.0–0.1)
Eosinophils Absolute: 0.3 10*3/uL (ref 0.0–0.7)
Hemoglobin: 17 g/dL (ref 13.0–17.0)
Lymphocytes Relative: 25 % (ref 12.0–46.0)
MCHC: 35.3 g/dL (ref 30.0–36.0)
Monocytes Relative: 9.7 % (ref 3.0–12.0)
Neutro Abs: 3.5 10*3/uL (ref 1.4–7.7)
Neutrophils Relative %: 60.3 % (ref 43.0–77.0)
Platelets: 122 10*3/uL — ABNORMAL LOW (ref 150.0–400.0)
RDW: 13.5 % (ref 11.5–14.6)

## 2011-02-20 LAB — HEMOGLOBIN A1C: Hgb A1c MFr Bld: 8.3 % — ABNORMAL HIGH (ref 4.6–6.5)

## 2011-02-20 LAB — HEPATIC FUNCTION PANEL
AST: 91 U/L — ABNORMAL HIGH (ref 0–37)
Albumin: 4 g/dL (ref 3.5–5.2)
Total Bilirubin: 0.9 mg/dL (ref 0.3–1.2)

## 2011-02-20 LAB — LIPID PANEL
Cholesterol: 202 mg/dL — ABNORMAL HIGH (ref 0–200)
HDL: 49.1 mg/dL (ref >39.00)
Total CHOL/HDL Ratio: 4
Triglycerides: 136 mg/dL (ref 0.0–149.0)
VLDL: 27.2 mg/dL (ref 0.0–40.0)

## 2011-02-20 LAB — MICROALBUMIN / CREATININE URINE RATIO
Creatinine,U: 202.7 mg/dL
Microalb, Ur: 19.1 mg/dL — ABNORMAL HIGH (ref 0.0–1.9)

## 2011-02-20 LAB — VITAMIN B12: Vitamin B-12: 502 pg/mL (ref 211–911)

## 2011-02-20 LAB — BASIC METABOLIC PANEL
BUN: 15 mg/dL (ref 6–23)
Calcium: 8.8 mg/dL (ref 8.4–10.5)
GFR: 93.37 mL/min (ref >60.00)
Glucose, Bld: 218 mg/dL — ABNORMAL HIGH (ref 70–99)
Potassium: 4.9 mEq/L (ref 3.5–5.1)

## 2011-02-20 LAB — TSH: TSH: 2.66 u[IU]/mL (ref 0.35–5.50)

## 2011-02-27 ENCOUNTER — Encounter: Payer: Self-pay | Admitting: Family Medicine

## 2011-02-27 ENCOUNTER — Encounter (INDEPENDENT_AMBULATORY_CARE_PROVIDER_SITE_OTHER): Payer: MEDICARE | Admitting: Family Medicine

## 2011-02-27 DIAGNOSIS — Z0000 Encounter for general adult medical examination without abnormal findings: Secondary | ICD-10-CM

## 2011-03-05 NOTE — Assessment & Plan Note (Signed)
Summary: CPX/ JRR   Vital Signs:  Patient profile:   70 year old male Height:      67.5 inches Weight:      197.25 pounds BMI:     30.55 Temp:     98.5 degrees F oral Pulse rate:   64 / minute Pulse rhythm:   regular BP sitting:   148 / 78  (left arm) Cuff size:   large  Vitals Entered By: Sydell Axon LPN (February 26, 1609 10:52 AM) CC: 30 minute checkup, had a colonoscopy ? 5-6 years ago  Vision Screening:Left eye with correction: 20 / 20 Right eye with correction: 20 / 25 Both eyes with correction: 20 / 20       25db HL: Left  500 hz: 25db 1000 hz: 25db 2000 hz: No Response 4000 hz: No Response Right  500 hz: 20db 1000 hz: 25db 2000 hz: No Response 4000 hz: No Response    History of Present Illness: Pt here for Comp Exam. He stopped Simva 1/2 after being on Co Q 10 for a while. He has continued on Co Q 10 but is off simva. 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  He feels better mentally from being off Simva and back pain is gone. His depression went away as well.   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 Take 1 Tablet By Mouth Once A Day 2)  Adult  Aspirin Ec Low Strength 81 Mg  Tbec (Aspirin) .Marland Kitchen.. 1 By Mouth Daily 3)  Norvasc 5 Mg  Tabs (Amlodipine Besylate) .Marland Kitchen.. 1 By Mouth At Bedtime 4)  Eye Vitamins   Caps (Multiple Vitamins-Minerals) .... Due To Macular Degeneration 5)  One-Daily Multivitamins   Tabs (Multiple Vitamin) .Marland Kitchen.. 1 Daily By Mouth 6)  Fish Oil 1000 Mg Caps (Omega-3 Fatty Acids) .... Daily By Mouth 7)  Bl Vitamin C 500 Mg  Tabs (Ascorbic Acid) .... Take One By Mouth Daily 8)  Anusol-Hc 25 Mg Supp (Hydrocortisone Acetate) .... One Supp Pr Three Times A Day For One Week, Then As Needed. 9)  Ibuprofen 800 Mg Tabs (Ibuprofen) .Marland Kitchen.. 1 Tablet With Meals As Needed For Pain (Use Sparingly) 10)  Lisinopril 40 Mg Tabs (Lisinopril) .... One Tab By Mouth Once A Day. 11)  Simvastatin 80 Mg Tabs (Simvastatin) .... 1/2  Tab By Mouth At Night. 12)  Methocarbamol 500 Mg Tabs (Methocarbamol) .... Take One By Mouth Two Times A Day As Needed 13)  Vitamin B-12 Cr 2000 Mcg Cr-Tabs (Cyanocobalamin) .... Take One By Mouth Daily 14)  Fluticasone Propionate 50 Mcg/act Susp (Fluticasone Propionate) .... Use 2 Sprays in Each Nostril Daily  Current Medications (verified): 1)  Atenolol 50 Mg Tabs (Atenolol) .... Take 1 Tablet By Mouth Once A Day 2)  Adult Aspirin Ec Low Strength 81 Mg  Tbec (Aspirin) .Marland Kitchen.. 1 By Mouth Daily 3)  Norvasc 5 Mg  Tabs (Amlodipine Besylate) .Marland Kitchen.. 1 By Mouth At Bedtime 4)  Eye Vitamins   Caps (Multiple Vitamins-Minerals) .... Due To Macular Degeneration 5)  One-Daily Multivitamins   Tabs (Multiple Vitamin) .Marland Kitchen.. 1 Daily By Mouth 6)  Fish Oil 1000 Mg Caps (Omega-3 Fatty Acids) .... Daily By Mouth 7)  Bl Vitamin C 500 Mg  Tabs (Ascorbic Acid) .... Take One By Mouth Daily 8)  Anusol-Hc 25 Mg Supp (Hydrocortisone Acetate) .... One Supp Pr Three Times A Day For One Week, Then As Needed. 9)  Ibuprofen 800 Mg Tabs (Ibuprofen) .Marland Kitchen.. 1 Tablet With Meals As Needed For Pain (Use Sparingly) 10)  Lisinopril 40 Mg Tabs (Lisinopril) .... One Tab  By Mouth Once A Day. 11)  Methocarbamol 500 Mg Tabs (Methocarbamol) .... Take One By Mouth Two Times A Day As Needed 12)  Vitamin B-12 Cr 2000 Mcg Cr-Tabs (Cyanocobalamin) .... Take One By Mouth Daily 13)  Fluticasone Propionate 50 Mcg/act Susp (Fluticasone Propionate) .... Use 2 Sprays in Each Nostril Daily 14)  Co Q-10 100 Mg Caps (Coenzyme Q10) .... Take One By Mouth Daily  Allergies: 1)  ! Atacand 2)  ! Cardura 3)  ! Nifedipine 4)  ! Cardizem 5)  ! Simvastatin (Simvastatin) 6)  Crestor (Rosuvastatin Calcium)  Past History:  Past Medical History: Last updated: 06/09/2007 Diabetes mellitus, type II Diverticulosis, colon Hyperlipidemia Hypertension  Past Surgical History: Last updated: 06/07/2009 03/16/98      L/S x-rays, slight degen. changes o/w nml. 02/02/01      Cerv. x-rays, DDD  C4-5, C5-6 mild bilat. compr 02/29/00        Abd. U/S, fatty liver, (-) gallstones 08/24/02        Echo   TRIV MR , mildly increased LA, nml LVF 06/23/03        Colonoscopy nml (Dr Laural Benes)    10 yrs 06/07/09      Echo   Triv MR, TR, PR  Family History: Last updated: 12/28/2008 Father:  dec 92 Prostate Mother: dec 87 pneumonia in [place] feeding tube, multiple mini strokes, HTN Siblings: None CV:  (+) MGF died MI HBP:  (+) self, mother DM:  (+) self Depression:  (+) mother's side Stroke:  (+) MGM  Social History: Last updated: 12/28/2008 Marital Status: Married, lives with wife Children: 2 Occupation: [employer], picks up [employer]. [employer], flunked eye exam, quit Former Smoker, 3 PPD x 30 years 90PYH, quit 15 years ago ('90) Alcohol use-yes, 3-4 wine/day  - (8/03 - 2 beers, 2 wines, 2 brandies/day) Drug use-no works at [employer]  Risk Factors: Alcohol Use: 3 (02/27/2011) Caffeine Use: 4 (02/27/2011) Exercise: yes (02/27/2011)  Risk Factors: Smoking Status: quit (02/27/2011) Passive Smoke Exposure: no (02/27/2011)  Review of Systems General:  Denies chills, fatigue, fever, sweats,  weakness, and weight loss; see HPI..has nightsweats chronically as did his Dad and so does his Son.. Eyes:  Denies blurring, discharge, and eye pain. ENT:  Denies decreased hearing, ear discharge, earache, and ringing in ears. CV:  Denies chest pain or discomfort, fainting, fatigue, palpitations, shortness of breath with exertion, swelling of feet, and swelling of hands. Resp:  Denies cough, shortness of  breath, and wheezing; keeps mucous as PND.Marland Kitchen GI:  Denies abdominal pain, bloody stools, change in bowel habits, constipation, dark tarry stools, diarrhea, indigestion, loss of appetite, nausea, vomiting, vomiting blood, and yellowish skin color. GU:  Complains of nocturia; denies discharge, dysuria, incontinence, urinary frequency, and urinary hesitancy; gets up every 3-4 hrs. MS:  Complains of joint pain and stiffness; denies low back pain, muscle aches, and cramps; generalized after working out. Minimal cramps now.. Derm:  Denies dryness, itching, and rash. Neuro:  Denies numbness, poor balance, tingling, and tremors; some feet tingling.Marland Kitchen  Physical Exam  General:  Well-developed,well-nourished,in no acute distress; alert,appropriate and cooperative throughout examination Head:  Normocephalic and atraumatic without obvious abnormalities. No apparent alopecia or balding. Mild balding on top. Hair in ponytail today. Eyes:  Conjunctiva clear bilaterally.  Ears:  External ear exam shows no significant lesions or deformities.  Otoscopic examination reveals clear canals, tympanic membranes are intact bilaterally without bulging, retraction, inflammation or discharge. Hearing is grossly normal bilaterally. Nose:  External nasal examination shows no deformity or inflammation. Nasal mucosa are pink and moist without lesions or exudates. Mouth:  Oral mucosa and oropharynx without lesions or exudates.  Teeth in good repair. Throat benign. Neck:  No deformities, masses, or tenderness noted. Chest Wall:  No  deformities, masses, tenderness or gynecomastia noted. Breasts:  No masses or gynecomastia noted Lungs:  Normal respiratory effort, chest expands symmetrically. Lungs are clear to auscultation, no crackles or wheezes. Heart:  Normal rate and regular rhythm. S1 and S2 normal without gallop, click, rub or other extra sounds. II/VI sys murmur heard best left sternal border. Abdomen:  Bowel sounds positive,abdomen soft and non-tender without masses, organomegaly or hernias noted. Rectal:  No external abnormalities noted. Normal sphincter tone. No rectal masses or tenderness. G neg. Genitalia:  Testes bilaterally descended without nodularity, tenderness or masses. No scrotal masses or lesions. No penis lesions or urethral discharge. Prostate:  Prostate gland firm and smooth, no enlargement, nodularity, tenderness, mass, asymmetry or induration. 20-30gms. Msk:  No deformity or scoliosis noted of thoracic or lumbar spine.  Mild discomfort of the L/S area with movement, lying on table and getting up esp. Pulses:  R and L carotid,radial,femoral,dorsalis pedis and posterior tibial pulses are full and equal bilaterally Extremities:  No clubbing, cyanosis, edema, or deformity noted with normal full range of motion of all joints.   Neurologic:  No cranial nerve deficits noted. Station and gait are normal. Sensory, motor and coordinative functions appear intact. Skin:  Intact without suspicious lesions or rashes, sundanmage of trunk generally with Aks and Sks. Cervical Nodes:  No lymphadenopathy noted Inguinal Nodes:  No significant adenopathy Psych:  Cognition and judgment appear intact. Alert and cooperative with normal attention span and concentration. No apparent delusions, illusions, hallucinations   Impression & Recommendations:  Problem # 1:  HEALTH MAINTENANCE EXAM (ICD-V70.0) Assessment Comment Only  Reviewed preventive care protocols, scheduled due services, and updated immunizations. I have  personally reviewed the Medicare Annual Wellness questionnaire and have noted 1.   The patient's medical and social history 2.   Their use of alcohol, tobacco or illicit drugs 3.   Their current medications and supplements 4.   The patient's functional ability including ADL's, fall risks, home safety risks and hearing or visual             impairment. 5.   Diet and physical activities 6.   Evidence for depression or mood disorders  Problem # 2:  HEART MURMUR,  SYSTOLIC (ICD-785.2) Assessment: Unchanged Stable.  Problem # 3:  SPECIAL SCREENING MALIGNANT NEOPLASM OF PROSTATE (ICD-V76.44) Assessment: Unchanged Stable PSA and exam.  Problem # 4:  REACTIVE AIRWAY DISEASE WITH ALLERGIES (ICD-493.90) Assessment: Unchanged Stable.  Problem # 5:  HEMORRHOIDS (ICD-455.6) Assessment: Unchanged Deflated.  Problem # 6:  NICOTINE ADDICTION (ICD-305.1) Assessment: Unchanged Still off cigs.  Problem # 7:  FATTY LIVER DISEASE (ICD-571.8) Assessment: Unchanged Stable, slightly worsened. Start Prava and watch diet.  Problem # 8:  HYPERTENSION (ICD-401.9) Assessment: Unchanged Slightly raised like usual. Doesn't want to increase meds. His updated medication list for this problem includes:    Atenolol 50 Mg Tabs (Atenolol) .Marland Kitchen... Take 1 tablet by mouth once a day    Norvasc 5 Mg Tabs (Amlodipine besylate) .Marland Kitchen... 1 by mouth at bedtime    Lisinopril 40 Mg Tabs (Lisinopril) ..... One tab by mouth once a day.  BP today: 148/78 Prior BP: 138/84 (10/31/2010)  Labs Reviewed: K+: 4.9 (02/20/2011) Creat: : 0.9 (02/20/2011)   Chol: 202 (02/20/2011)   HDL: 49.10 (02/20/2011)   LDL: 74 (12/29/2009)   TG: 136.0 (02/20/2011)  Problem # 9:  HYPERLIPIDEMIA (ICD-272.4) See instructions. The following medications were removed from the medication list:    Simvastatin 80 Mg Tabs (Simvastatin) .Marland Kitchen... 1/2  tab by mouth at night. His updated medication list for this problem includes:    Pravastatin Sodium 80 Mg  Tabs (Pravastatin sodium) ..... One tab by mouth at night  Labs Reviewed: SGOT: 91 (02/20/2011)   SGPT: 91 (02/20/2011)   HDL:49.10 (02/20/2011), 57.80 (12/29/2009)  LDL:74 (12/29/2009), 72 (05/23/2009)  Chol:202 (02/20/2011), 147 (12/29/2009)  Trig:136.0 (02/20/2011), 78.0 (12/29/2009)  Problem # 10:  DIVERTICULOSIS, COLON (ICD-562.10) Assessment: Unchanged Discussed being seen for LLQ pain.  Problem # 11:  DIABETES MELLITUS, TYPE II (ICD-250.00) Really needs to get back on diet and regular increased exercise. His updated medication list for this problem includes:    Adult Aspirin Ec Low Strength 81 Mg Tbec (Aspirin) .Marland Kitchen... 1 by mouth daily    Lisinopril 40 Mg Tabs (Lisinopril) ..... One tab by mouth once a day.  Labs Reviewed: Creat: 0.9 (02/20/2011)   Microalbumin: 16.5 (10/23/2006)  Last Eye Exam: normal (10/11/2008) Reviewed HgBA1c results: 8.3 (02/20/2011)  6.2 (12/29/2009)  Complete Medication List: 1)  Atenolol 50 Mg Tabs (Atenolol) .... Take 1 tablet by mouth once a day 2)  Adult Aspirin Ec Low Strength 81 Mg Tbec (Aspirin) .Marland Kitchen.. 1 by mouth daily 3)  Norvasc 5 Mg Tabs (Amlodipine besylate) .Marland Kitchen.. 1 by mouth at bedtime 4)  Eye Vitamins Caps (Multiple vitamins-minerals) .... Due to macular degeneration 5)  One-daily Multivitamins Tabs (Multiple vitamin) .Marland Kitchen.. 1 daily by mouth 6)  Fish Oil 1000 Mg Caps (Omega-3 fatty acids) .... Daily by mouth 7)  Bl Vitamin C 500 Mg Tabs (Ascorbic acid) .... Take one by mouth daily 8)  Anusol-hc 25 Mg Supp (Hydrocortisone acetate) .... One supp pr three times a day for one week, then as needed. 9)  Ibuprofen 800 Mg Tabs (Ibuprofen) .Marland Kitchen.. 1 tablet with meals as needed for pain (use sparingly) 10)  Lisinopril 40 Mg Tabs (Lisinopril) .... One tab by mouth once a day. 11)  Methocarbamol 500 Mg Tabs (Methocarbamol) .... Take one by mouth two times a day as needed 12)  Vitamin B-12 Cr 2000 Mcg Cr-tabs (Cyanocobalamin) .... Take one by mouth daily 13)   Fluticasone Propionate 50 Mcg/act Susp (Fluticasone propionate) .... Use 2 sprays in each nostril daily 14)  Co Q-10  100 Mg Caps (Coenzyme q10) .... Take one by mouth daily 15)  Pravastatin Sodium 80 Mg Tabs (Pravastatin sodium) .... One tab by mouth at night  Other Orders: Audiometry [phone]) Vision Screening (98119)  Patient Instructions: 1)  Assume white coat syndrome. Recheck BP next time. 2)  Start Prava 1/2 tab at [employer] for 2-3 wweeks then whole tab. 3)  SGOT, SGPT 272.4    6 WEEKS 4)  See me in 3 mos, SGOT, SGPT, CHOL PROFILE  272. 4    prior 5)  Get eye exam. 6)  Do foot exam next time. 7)  ?Start Metformin? Prescriptions: LISINOPRIL 40 MG TABS (LISINOPRIL) one tab by mouth once a day.  #90 x 3   Entered and Authorized by:   Shaune Leeks MD   Signed by:   Shaune Leeks MD on 02/27/2011   Method used:   Faxed to ...       [facility] (mail-order)             , [place]         Ph: [phone]       Fax: [phone]   RxID:   [id number] NORVASC 5 MG  TABS (AMLODIPINE BESYLATE) 1 by mouth at bedtime  #90 x 3   Entered and Authorized by:   Shaune Leeks MD   Signed by:   Shaune Leeks MD on 02/27/2011   Method used:   Faxed to ...       [facility] (mail-order)             , [place]         Ph: [phone]       Fax: [phone]   RxID:   [id number] ATENOLOL 50 MG TABS (ATENOLOL) Take 1 tablet by mouth once a day  #90 x 3   Entered and Authorized by:   Shaune Leeks MD   Signed by:   Shaune Leeks MD on 02/27/2011   Method used:   Faxed to ...       [facility] (mail-order)             , [place]         Ph: [phone]       Fax: [phone]   RxID:   [id number] PRAVASTATIN SODIUM 80 MG TABS (PRAVASTATIN SODIUM) one tab by mouth at night  #90 x 3   Entered and Authorized by:   Shaune Leeks MD   Signed by:   Shaune Leeks MD on 02/27/2011   Method used:   Faxed to ...       [facility] (mail-order)             , [place]          Ph: [phone]       Fax: [phone]   RxID:   [id number]    Orders Added: 1)  Audiometry [92552] 2)  Vision Screening [phone] 3)  Est. Patient 65& > [78938]    Current Allergies (reviewed today): ! ATACAND ! CARDURA ! NIFEDIPINE ! CARDIZEM ! SIMVASTATIN (SIMVASTATIN) CRESTOR (ROSUVASTATIN CALCIUM)

## 2011-03-05 NOTE — Letter (Signed)
Summary: Nature conservation officer [employer] Wellness Visit Questionnaire   [employer] Annual Wellness Visit Questionnaire   Imported By: Beau Fanny 02/27/2011 13:12:48  _____________________________________________________________________  External Attachment:    Type:   Image     Comment:   External Document

## 2011-04-05 ENCOUNTER — Other Ambulatory Visit: Payer: Self-pay | Admitting: *Deleted

## 2011-04-05 DIAGNOSIS — E7800 Pure hypercholesterolemia, unspecified: Secondary | ICD-10-CM

## 2011-04-09 ENCOUNTER — Encounter: Payer: Self-pay | Admitting: Family Medicine

## 2011-04-10 ENCOUNTER — Other Ambulatory Visit (INDEPENDENT_AMBULATORY_CARE_PROVIDER_SITE_OTHER): Payer: MEDICARE | Admitting: Family Medicine

## 2011-04-10 DIAGNOSIS — E7800 Pure hypercholesterolemia, unspecified: Secondary | ICD-10-CM

## 2011-04-23 NOTE — Progress Notes (Signed)
Pls let pt know, liver functions stable. Cont chol med and recheck in 6 weeks and see me as scheduled.

## 2011-05-10 NOTE — Op Note (Signed)
   NAME:  Shawn Espinoza, ZALEWSKI                      ACCOUNT NO.:  [id number]   MEDICAL RECORD NO.:  [id number]                   PATIENT TYPE:  AMB   LOCATION:  ENDO                                 FACILITY:  [facility]   PHYSICIAN:  Danise Edge, M.D.                DATE OF BIRTH:  03-Apr-1941   DATE OF PROCEDURE:  06/23/2003  DATE OF DISCHARGE:                                 OPERATIVE REPORT   REFERRING PHYSICIAN:  Dellis Anes. Idell Pickles, M.D.   PROCEDURE PERFORMED:  Colonoscopy.   ENDOSCOPIST:  Charolett Bumpers, M.D.   INDICATIONS FOR PROCEDURE:  Shawn Espinoza is a 70 year old male born  11-20-1941.  [name] is scheduled to undergo his first  screening colonoscopy with polypectomy to prevent colon cancer.   PREMEDICATION:  Demerol 50 mg, Versed 10 mg.   DESCRIPTION OF PROCEDURE:  After obtaining informed consent, [name]  was placed in the left lateral decubitus position.  I administered  intravenous Demerol and intravenous Versed to achieve conscious sedation for  the procedure.  The patient's blood pressure, oxygen saturations and cardiac  rhythm were monitored throughout the procedure and documented in the medical  record.   Anal inspection was normal.  Digital rectal exam revealed a non-nodular  prostate.  The Olympus adult colonoscope was introduced into the rectum and  advanced to cecum.  Colonic preparation for the exam today was excellent.   Rectum:  Normal.   Sigmoid colon and descending colon:  Normal.   Splenic flexure:  Normal.   Transverse colon:  Normal.   Hepatic flexure:  Normal.   Ascending colon:  Normal.   Cecum and ileocecal valve:  Normal.    ASSESSMENT:  Normal screening proctocolonoscopy to the cecum.  No endoscopic  evidence for the presence of  colorectal neoplasia.                                                 Danise Edge, M.D.    MJ/MEDQ  D:  06/23/2003  T:  06/23/2003  Job:  [id number]   cc:   Dellis Anes. Idell Pickles,  M.D.  [address]  [place]  [place] [address]  Fax: [phone]

## 2011-07-06 ENCOUNTER — Encounter: Payer: Self-pay | Admitting: Family Medicine

## 2011-07-26 ENCOUNTER — Other Ambulatory Visit (INDEPENDENT_AMBULATORY_CARE_PROVIDER_SITE_OTHER): Payer: MEDICARE | Admitting: Family Medicine

## 2011-07-26 DIAGNOSIS — E785 Hyperlipidemia, unspecified: Secondary | ICD-10-CM

## 2011-07-26 LAB — ALT: ALT: 104 U/L — ABNORMAL HIGH (ref 0–53)

## 2011-07-26 LAB — LIPID PANEL
LDL Cholesterol: 111 mg/dL — ABNORMAL HIGH (ref 0–99)
Total CHOL/HDL Ratio: 4
VLDL: 30.6 mg/dL (ref 0.0–40.0)

## 2011-07-26 LAB — AST: AST: 93 U/L — ABNORMAL HIGH (ref 0–37)

## 2011-07-31 ENCOUNTER — Ambulatory Visit (INDEPENDENT_AMBULATORY_CARE_PROVIDER_SITE_OTHER): Payer: Medicare Other | Admitting: Family Medicine

## 2011-07-31 ENCOUNTER — Encounter: Payer: Self-pay | Admitting: Family Medicine

## 2011-07-31 DIAGNOSIS — I10 Essential (primary) hypertension: Secondary | ICD-10-CM

## 2011-07-31 DIAGNOSIS — E785 Hyperlipidemia, unspecified: Secondary | ICD-10-CM

## 2011-07-31 DIAGNOSIS — K7689 Other specified diseases of liver: Secondary | ICD-10-CM

## 2011-07-31 DIAGNOSIS — E119 Type 2 diabetes mellitus without complications: Secondary | ICD-10-CM

## 2011-07-31 MED ORDER — METHOCARBAMOL 500 MG PO TABS: 500.0000 mg | ORAL_TABLET | Freq: Three times a day (TID) | ORAL | Status: DC

## 2011-07-31 MED ORDER — IBUPROFEN 800 MG PO TABS: 800.0000 mg | ORAL_TABLET | Freq: Three times a day (TID) | ORAL | Status: DC | PRN

## 2011-07-31 NOTE — Assessment & Plan Note (Signed)
Well controlled. Cont curr trmt.

## 2011-07-31 NOTE — Patient Instructions (Signed)
Pls schedule pt with new doctor for get acquainted/recheck Jan/Feb timeframe.

## 2011-07-31 NOTE — Assessment & Plan Note (Signed)
Bears watching. His LFTs are elevated but stable. Treating his LDL would probably help this. Discussed.

## 2011-07-31 NOTE — Progress Notes (Signed)
  Subjective:    Patient ID: Shawn Espinoza, male    DOB: [birth year], 70 y.o.   MRN: [id number]  HPIPt here for followup. He stopped his Pravachol as he felt poorly on it. His muscles ached. He was also mentally screwed up and is almost a happy individual today. [employer] has a nutitionist that is willing to help he and his wife. "I still have my sausage patty in the morning" but he feels about the best he has mentally and physically in the last four years. He also requests script for IBP and methocarbamol. He knows to use them as little as possible. 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    Review of Systems  Constitutional: Negative for fever, chills, diaphoresis, activity change, appetite change and fatigue.  HENT: Negative for hearing loss, ear pain, congestion, sore throat, rhinorrhea, neck pain, neck stiffness, postnasal drip, sinus pressure, tinnitus and ear discharge.   Eyes: Negative for pain, discharge and visual disturbance.  Respiratory: Negative for cough, shortness of breath and wheezing.   Cardiovascular: Negative for chest pain and palpitations.       No SOB w/ exertion  Gastrointestinal:       No heartburn or swallowing problems.  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  Skin:       No itching or dryness.  Neurological:       No tingling or balance problems.  All other systems reviewed and are negative.       Objective:   Physical Exam        Assessment & Plan:

## 2011-07-31 NOTE — Assessment & Plan Note (Signed)
Not the greatest control in the past with last A1C at 8+. Must become more compliant but now feels greatdoing what he is currently doing. Is a hard sell. Discussed avoiding sweets and carbs.

## 2011-07-31 NOTE — Assessment & Plan Note (Signed)
I believe we have tried all statins now, and all have made him miserable and he does not want further trmt with them. We discussed strict diet and continued exercise.  Doubt he would try Zetia but may be worth a try.

## 2011-12-23 ENCOUNTER — Telehealth: Payer: Self-pay | Admitting: Internal Medicine

## 2011-12-23 MED ORDER — OSELTAMIVIR PHOSPHATE 75 MG PO CAPS: 75.0000 mg | ORAL_CAPSULE | Freq: Two times a day (BID) | ORAL | Status: DC

## 2011-12-23 NOTE — Telephone Encounter (Signed)
Patient's wife called and stated he is having productive deep cough with greenish mucous, shakey feeling, body aches.  Wanted to know your advice on what to do.

## 2011-12-23 NOTE — Telephone Encounter (Signed)
If no fever less likely flu however given exposure, will send in tamiflu for pt if he desires to take (may shorten duration of illness by 1 d if flu). To come in if fever >101.5 or worsening instead of improving.

## 2011-12-23 NOTE — Telephone Encounter (Signed)
Spoke with patient. He said he has had symptoms x 2 days. No fever that he can tell. He did say his wife and daughter have both tested positive for the flu with the nasal swab test. Should he still come in for eval or go ahead and treat to avoid exposure in the office?

## 2011-12-23 NOTE — Telephone Encounter (Signed)
Patient notified. He said he may go ahead and take the tamiflu anyway. He will come in if worsening or if temp higher than 101.5.

## 2011-12-23 NOTE — Telephone Encounter (Signed)
When did this start?  Any fever >101.5 or exposure to flu?  May need to be evaluated.  Can he come in at 3:45 pm today?  If not may need to go to [facility].

## 2011-12-24 DIAGNOSIS — R768 Other specified abnormal immunological findings in serum: Secondary | ICD-10-CM

## 2011-12-24 HISTORY — DX: Other specified abnormal immunological findings in serum: R76.8

## 2011-12-26 ENCOUNTER — Telehealth: Payer: Self-pay

## 2011-12-26 NOTE — Telephone Encounter (Signed)
Pt does not have high fever now but non productive cough is deep and seems worse. Pt does not want to go to urgent care. Pt is also taking Tamiflu. appt with Dr Sharen Hones 12/27/11 at 4pm. If pt worsens or temp 101.5 or above pt will call back or go to UC.

## 2011-12-27 ENCOUNTER — Ambulatory Visit: Payer: Medicare Other | Admitting: Family Medicine

## 2011-12-29 ENCOUNTER — Other Ambulatory Visit: Payer: Self-pay | Admitting: Family Medicine

## 2011-12-29 DIAGNOSIS — I10 Essential (primary) hypertension: Secondary | ICD-10-CM

## 2011-12-29 DIAGNOSIS — E119 Type 2 diabetes mellitus without complications: Secondary | ICD-10-CM

## 2011-12-29 DIAGNOSIS — Z125 Encounter for screening for malignant neoplasm of prostate: Secondary | ICD-10-CM

## 2011-12-29 DIAGNOSIS — E785 Hyperlipidemia, unspecified: Secondary | ICD-10-CM

## 2011-12-29 DIAGNOSIS — K7689 Other specified diseases of liver: Secondary | ICD-10-CM

## 2011-12-31 ENCOUNTER — Encounter: Payer: Self-pay | Admitting: Family Medicine

## 2011-12-31 ENCOUNTER — Ambulatory Visit (INDEPENDENT_AMBULATORY_CARE_PROVIDER_SITE_OTHER): Payer: Medicare Other | Admitting: Family Medicine

## 2011-12-31 ENCOUNTER — Other Ambulatory Visit (INDEPENDENT_AMBULATORY_CARE_PROVIDER_SITE_OTHER): Payer: Medicare Other

## 2011-12-31 VITALS — BP 158/90 | HR 72 | Temp 98.2°F | Wt 190.2 lb

## 2011-12-31 DIAGNOSIS — E785 Hyperlipidemia, unspecified: Secondary | ICD-10-CM

## 2011-12-31 DIAGNOSIS — J40 Bronchitis, not specified as acute or chronic: Secondary | ICD-10-CM | POA: Insufficient documentation

## 2011-12-31 DIAGNOSIS — K7689 Other specified diseases of liver: Secondary | ICD-10-CM

## 2011-12-31 DIAGNOSIS — E119 Type 2 diabetes mellitus without complications: Secondary | ICD-10-CM

## 2011-12-31 DIAGNOSIS — I10 Essential (primary) hypertension: Secondary | ICD-10-CM

## 2011-12-31 DIAGNOSIS — Z125 Encounter for screening for malignant neoplasm of prostate: Secondary | ICD-10-CM

## 2011-12-31 LAB — COMPREHENSIVE METABOLIC PANEL
AST: 120 U/L — ABNORMAL HIGH (ref 0–37)
Albumin: 3.9 g/dL (ref 3.5–5.2)
Alkaline Phosphatase: 76 U/L (ref 39–117)
BUN: 16 mg/dL (ref 6–23)
Calcium: 8.7 mg/dL (ref 8.4–10.5)
Creatinine, Ser: 0.7 mg/dL (ref 0.4–1.5)
Glucose, Bld: 196 mg/dL — ABNORMAL HIGH (ref 70–99)

## 2011-12-31 LAB — CBC WITH DIFFERENTIAL/PLATELET
Basophils Absolute: 0 10*3/uL (ref 0.0–0.1)
Eosinophils Absolute: 0.1 10*3/uL (ref 0.0–0.7)
Eosinophils Relative: 2.5 % (ref 0.0–5.0)
HCT: 50 % (ref 39.0–52.0)
Lymphs Abs: 1.2 10*3/uL (ref 0.7–4.0)
MCV: 97.7 fl (ref 78.0–100.0)
Monocytes Absolute: 0.3 10*3/uL (ref 0.1–1.0)
Neutrophils Relative %: 68.6 % (ref 43.0–77.0)
Platelets: 141 10*3/uL — ABNORMAL LOW (ref 150.0–400.0)
RDW: 12.7 % (ref 11.5–14.6)
WBC: 5.6 10*3/uL (ref 4.5–10.5)

## 2011-12-31 LAB — LIPID PANEL
Cholesterol: 207 mg/dL — ABNORMAL HIGH (ref 0–200)
HDL: 42.5 mg/dL (ref >39.00)
Triglycerides: 188 mg/dL — ABNORMAL HIGH (ref 0.0–149.0)

## 2011-12-31 LAB — PSA: PSA: 1.36 ng/mL (ref 0.10–4.00)

## 2011-12-31 LAB — TSH: TSH: 2.34 u[IU]/mL (ref 0.35–5.50)

## 2011-12-31 LAB — HEMOGLOBIN A1C: Hgb A1c MFr Bld: 8.4 % — ABNORMAL HIGH (ref 4.6–6.5)

## 2011-12-31 MED ORDER — AZITHROMYCIN 250 MG PO TABS: ORAL_TABLET | ORAL | Status: AC

## 2011-12-31 MED ORDER — HYDROCOD POLST-CHLORPHEN POLST 10-8 MG/5ML PO LQCR: 5.0000 mL | Freq: Every evening | ORAL | Status: DC | PRN

## 2011-12-31 NOTE — Assessment & Plan Note (Signed)
Given duration, will treat with zpack.  (h/o smoker, ?COPD). tussionex for cough prn. Update [place] if not improving, recheck next week at CPE

## 2011-12-31 NOTE — Progress Notes (Signed)
  Subjective:    Patient ID: Shawn Espinoza, male    DOB: 09/01/1941, 71 y.o.   MRN: [id number]  HPI CC: cough  Called in 12/31 with productive cough, body aches, + flu exposures (wife and daughter).  treated with tamiflu.  Presents today because continued cough, may be getting worse.  Now has progressed to dry cough.  Tends to get bronchitis.  sxs going on for 2 wks.  Mild dull HA.  Has tried ibuprofen and tamiflu, tussionex (only tussionex helps).  Uses nasal saline daily.  H/o 3ppd smoker x 30 yrs, quit 1990s.  No fevers, chills, abd pain, n/v, chest pain.  No ST.  No nasal congestion.  States tends to have elevated BP.  No h/o asthma, may have COPD.  Past Medical History  Diagnosis Date  . Diabetes mellitus, type 2   . Diverticulosis of colon   . Hyperlipidemia   . Hypertension   . Fatty liver 02/29/00    abd ultrasound   Review of Systems Per HPI    Objective:   Physical Exam  Nursing note and vitals reviewed. Constitutional: He appears well-developed and well-nourished. No distress.  HENT:  Head: Normocephalic and atraumatic.  Right Ear: Hearing, tympanic membrane and external ear normal.  Left Ear: Hearing, tympanic membrane, external ear and ear canal normal.  Nose: Nose normal. No mucosal edema or rhinorrhea. Right sinus exhibits no maxillary sinus tenderness and no frontal sinus tenderness. Left sinus exhibits no maxillary sinus tenderness and no frontal sinus tenderness.  Mouth/Throat: Uvula is midline, oropharynx is clear and moist and mucous membranes are normal. No oropharyngeal exudate, posterior oropharyngeal edema, posterior oropharyngeal erythema or tonsillar abscesses.       R cerumen covering canal  Eyes: Conjunctivae and EOM are normal. Pupils are equal, round, and reactive to light. No scleral icterus.  Neck: Normal range of motion. Neck supple.  Cardiovascular: Normal rate, regular rhythm, normal heart sounds and intact distal pulses.   No murmur  heard. Pulmonary/Chest: Effort normal and breath sounds normal. No respiratory distress. He has no wheezes. He has no rales.  Lymphadenopathy:    He has no cervical adenopathy.  Skin: Skin is warm and dry. No rash noted.       Assessment & Plan:

## 2011-12-31 NOTE — Patient Instructions (Signed)
Sounds like bronchitis. Take zpack and tussionex for cough as needed. Push fluids and rest. May use simple mucinex during day. Good to see you today. Let us know if not improving as expected.

## 2012-01-06 ENCOUNTER — Encounter: Payer: Self-pay | Admitting: Family Medicine

## 2012-01-06 ENCOUNTER — Ambulatory Visit (INDEPENDENT_AMBULATORY_CARE_PROVIDER_SITE_OTHER): Payer: Medicare Other | Admitting: Family Medicine

## 2012-01-06 DIAGNOSIS — E119 Type 2 diabetes mellitus without complications: Secondary | ICD-10-CM

## 2012-01-06 DIAGNOSIS — I10 Essential (primary) hypertension: Secondary | ICD-10-CM

## 2012-01-06 DIAGNOSIS — E785 Hyperlipidemia, unspecified: Secondary | ICD-10-CM

## 2012-01-06 DIAGNOSIS — K7689 Other specified diseases of liver: Secondary | ICD-10-CM

## 2012-01-06 MED ORDER — GLUCOSE BLOOD VI STRP: ORAL_STRIP | Status: DC

## 2012-01-06 NOTE — Assessment & Plan Note (Signed)
Advised back down on EtOH. Check abd [place]. ? Hepatomegaly on exam today. Elevated LFTs, ?NASH vs EtOH contributing.

## 2012-01-06 NOTE — Assessment & Plan Note (Signed)
Chronic, deteriorated. Elevated (new doc) Recheck next visit. Advised keep eye on bp and update me if consistently elevated, would increase norvasc.

## 2012-01-06 NOTE — Assessment & Plan Note (Signed)
Chronic, elevated. Discussed may need meds down road. Pt wants to do trial of low carb diet, recheck in 3 mo.

## 2012-01-06 NOTE — Patient Instructions (Addendum)
Keep eye on blood pressure - if staying higher than 140/90, call me. Check sugars in am a few times a week.  Ive sent in strips. Continue low carb diet and we will recheck sugar and bad cholesterol when you return. Pass by [facility]'s office to set up ultrasound. Return in 3 months for physical and we will check prostate then.

## 2012-01-06 NOTE — Assessment & Plan Note (Signed)
Chronic. Intolerant of statins, does not want further treatment with them. Discussed zetia vs red yeast rice.  Pt will look into red yeast rice. On co q 10 and fish oil

## 2012-01-06 NOTE — Progress Notes (Addendum)
  Subjective:    Patient ID: Shawn Espinoza, male    DOB: 09-28-1941, 71 y.o.   MRN: [id number]  HPI CC: transfer care from Dr. Hetty Ely  HTN - elevated today.  No HA, vision changes, CP/tightness, SOB, leg swelling.   Has been well controlled in past. BP Readings from Last 3 Encounters:  01/06/12 158/90  12/31/11 158/90  07/31/11 128/72   Wt Readings from Last 3 Encounters:  01/06/12 192 lb 12 oz (87.431 kg)  12/31/11 190 lb 4 oz (86.297 kg)  07/31/11 197 lb 4 oz (89.472 kg)   HLD - on coq10 and fish oil.  Intolerant of all statins he's tried (has tried 3-4).  Myalgias, constipation.  DM - last A1c fairly controlled.  Has been diet controlled in past.  Started low carb diet recently (1 mo ago).    ?fatty liver - LFTs elevated to 100s.  States has been checked for hepatitis in past and had immunizations against hepatitis.  Has not had liver US in past.  4-5 glasses (1 beer, 3 wine, 1 liquor).  Last CPE 02/2011. Colonoscopy UTD  Review of Systems Per HPI    Objective:   Physical Exam  Nursing note and vitals reviewed. Constitutional: He appears well-developed and well-nourished. No distress.  HENT:  Head: Normocephalic and atraumatic.  Mouth/Throat: Oropharynx is clear and moist. No oropharyngeal exudate.  Eyes: Conjunctivae and EOM are normal. Pupils are equal, round, and reactive to light. No scleral icterus.  Neck: Normal range of motion. Neck supple. Carotid bruit is not present.  Cardiovascular: Normal rate, regular rhythm and intact distal pulses.   Murmur (2/6 SEM) heard. Pulmonary/Chest: Effort normal and breath sounds normal. No respiratory distress. He has no wheezes. He has no rales.  Abdominal: Soft. Bowel sounds are normal. He exhibits distension (slight). He exhibits no mass. There is hepatomegaly. There is no tenderness. There is no rigidity, no rebound, no guarding and negative Murphy's sign.       No abd/renal bruits  Musculoskeletal: He exhibits no  edema.  Skin: Skin is warm and dry. No rash noted.       Assessment & Plan:

## 2012-01-07 ENCOUNTER — Other Ambulatory Visit: Payer: Self-pay | Admitting: Internal Medicine

## 2012-01-07 MED ORDER — GLUCOSE BLOOD VI STRP: ORAL_STRIP | Status: DC

## 2012-01-07 MED ORDER — LISINOPRIL 40 MG PO TABS: 40.0000 mg | ORAL_TABLET | Freq: Every day | ORAL | Status: DC

## 2012-01-07 MED ORDER — LANCETS MISC. MISC: 1.0000 "application " | Freq: Two times a day (BID) | Status: DC

## 2012-01-07 MED ORDER — AMLODIPINE BESYLATE 5 MG PO TABS: 5.0000 mg | ORAL_TABLET | Freq: Every day | ORAL | Status: DC

## 2012-01-07 NOTE — Telephone Encounter (Signed)
Patient requesting refill.  [facility] because patient will run out before [facility] sends his medication.

## 2012-01-08 ENCOUNTER — Other Ambulatory Visit: Payer: Self-pay | Admitting: Family Medicine

## 2012-01-08 ENCOUNTER — Ambulatory Visit
Admission: RE | Admit: 2012-01-08 | Discharge: 2012-01-08 | Disposition: A | Discharge status: Home / Self Care | Payer: Medicare Other | Source: Ambulatory Visit | Attending: Family Medicine | Admitting: Family Medicine

## 2012-01-08 ENCOUNTER — Other Ambulatory Visit: Payer: Self-pay | Admitting: Internal Medicine

## 2012-01-08 DIAGNOSIS — K7689 Other specified diseases of liver: Secondary | ICD-10-CM

## 2012-01-08 MED ORDER — LANCETS MISC. MISC: 1.0000 "application " | Freq: Two times a day (BID) | Status: DC

## 2012-01-08 MED ORDER — GLUCOSE BLOOD VI STRP: ORAL_STRIP | Status: AC

## 2012-01-08 NOTE — Telephone Encounter (Signed)
Patient called and stated [facility]'t [facility] so I faxed it to [facility].

## 2012-01-09 ENCOUNTER — Other Ambulatory Visit: Payer: Self-pay

## 2012-01-09 NOTE — Telephone Encounter (Signed)
Taken care of

## 2012-01-09 NOTE — Telephone Encounter (Signed)
Pt s wife left v/m [facility] needs written rx faxed to [phone] for diabetic test strips to include instructions on how often to use and medicare diagnosis code.

## 2012-01-09 NOTE — Telephone Encounter (Signed)
Ok to do.  Placed in kim's box.

## 2012-02-17 ENCOUNTER — Other Ambulatory Visit: Payer: Self-pay | Admitting: *Deleted

## 2012-02-17 MED ORDER — ATENOLOL 50 MG PO TABS: 50.0000 mg | ORAL_TABLET | Freq: Every day | ORAL | Status: DC

## 2012-02-17 NOTE — Telephone Encounter (Signed)
Patients spouse called to request that we send in a 30 day supply of [facility]ol [facility] until she can get things squared away with [facility].  Rx sent electronically.

## 2012-02-26 ENCOUNTER — Other Ambulatory Visit (INDEPENDENT_AMBULATORY_CARE_PROVIDER_SITE_OTHER): Payer: Medicare Other

## 2012-02-26 ENCOUNTER — Other Ambulatory Visit: Payer: Self-pay | Admitting: Family Medicine

## 2012-02-26 DIAGNOSIS — K7689 Other specified diseases of liver: Secondary | ICD-10-CM

## 2012-02-26 DIAGNOSIS — R7401 Elevation of levels of liver transaminase levels: Secondary | ICD-10-CM

## 2012-02-26 DIAGNOSIS — E119 Type 2 diabetes mellitus without complications: Secondary | ICD-10-CM

## 2012-02-26 DIAGNOSIS — E785 Hyperlipidemia, unspecified: Secondary | ICD-10-CM

## 2012-02-26 LAB — FERRITIN: Ferritin: 89.3 ng/mL (ref 22.0–322.0)

## 2012-02-26 LAB — COMPREHENSIVE METABOLIC PANEL
ALT: 43 U/L (ref 0–53)
AST: 35 U/L (ref 0–37)
Alkaline Phosphatase: 60 U/L (ref 39–117)
BUN: 15 mg/dL (ref 6–23)
Calcium: 8.9 mg/dL (ref 8.4–10.5)
Chloride: 102 mEq/L (ref 96–112)
Creatinine, Ser: 0.8 mg/dL (ref 0.4–1.5)

## 2012-02-26 LAB — HEMOGLOBIN A1C: Hgb A1c MFr Bld: 6.3 % (ref 4.6–6.5)

## 2012-02-27 LAB — HEPATITIS PANEL, ACUTE
HCV Ab: REACTIVE — AB
Hep A IgM: NEGATIVE
Hep B C IgM: NEGATIVE
Hepatitis B Surface Ag: NEGATIVE

## 2012-02-29 ENCOUNTER — Other Ambulatory Visit: Payer: Self-pay | Admitting: Family Medicine

## 2012-03-03 ENCOUNTER — Encounter (INDEPENDENT_AMBULATORY_CARE_PROVIDER_SITE_OTHER): Payer: Medicare Other | Admitting: Ophthalmology

## 2012-03-03 ENCOUNTER — Telehealth: Payer: Self-pay | Admitting: Radiology

## 2012-03-03 DIAGNOSIS — H2510 Age-related nuclear cataract, unspecified eye: Secondary | ICD-10-CM

## 2012-03-03 DIAGNOSIS — H43819 Vitreous degeneration, unspecified eye: Secondary | ICD-10-CM

## 2012-03-03 DIAGNOSIS — H3530 Unspecified macular degeneration: Secondary | ICD-10-CM

## 2012-03-03 DIAGNOSIS — H33309 Unspecified retinal break, unspecified eye: Secondary | ICD-10-CM

## 2012-03-03 DIAGNOSIS — H3560 Retinal hemorrhage, unspecified eye: Secondary | ICD-10-CM

## 2012-03-03 LAB — HEPATITIS C VRS RNA DETECT BY PCR-QUAL

## 2012-03-03 NOTE — Telephone Encounter (Signed)
Noted. Will draw when he returns.

## 2012-03-03 NOTE — Telephone Encounter (Signed)
[facility] called about the confirmation test you added, due to instrument trouble the sample is QNS to do the confirmation. Please have the patient redrawn .

## 2012-03-04 ENCOUNTER — Ambulatory Visit (INDEPENDENT_AMBULATORY_CARE_PROVIDER_SITE_OTHER): Payer: Medicare Other | Admitting: Family Medicine

## 2012-03-04 ENCOUNTER — Encounter: Payer: Self-pay | Admitting: Family Medicine

## 2012-03-04 VITALS — BP 136/80 | HR 76 | Temp 98.6°F | Ht 69.0 in | Wt 192.8 lb

## 2012-03-04 DIAGNOSIS — F17200 Nicotine dependence, unspecified, uncomplicated: Secondary | ICD-10-CM

## 2012-03-04 DIAGNOSIS — M545 Low back pain, unspecified: Secondary | ICD-10-CM

## 2012-03-04 DIAGNOSIS — I10 Essential (primary) hypertension: Secondary | ICD-10-CM

## 2012-03-04 DIAGNOSIS — R011 Cardiac murmur, unspecified: Secondary | ICD-10-CM

## 2012-03-04 DIAGNOSIS — R768 Other specified abnormal immunological findings in serum: Secondary | ICD-10-CM

## 2012-03-04 DIAGNOSIS — K649 Unspecified hemorrhoids: Secondary | ICD-10-CM

## 2012-03-04 DIAGNOSIS — E119 Type 2 diabetes mellitus without complications: Secondary | ICD-10-CM

## 2012-03-04 DIAGNOSIS — R894 Abnormal immunological findings in specimens from other organs, systems and tissues: Secondary | ICD-10-CM

## 2012-03-04 DIAGNOSIS — Z0000 Encounter for general adult medical examination without abnormal findings: Secondary | ICD-10-CM

## 2012-03-04 DIAGNOSIS — E785 Hyperlipidemia, unspecified: Secondary | ICD-10-CM

## 2012-03-04 DIAGNOSIS — R7689 Other specified abnormal immunological findings in serum: Secondary | ICD-10-CM

## 2012-03-04 DIAGNOSIS — K7689 Other specified diseases of liver: Secondary | ICD-10-CM

## 2012-03-04 DIAGNOSIS — Z1211 Encounter for screening for malignant neoplasm of colon: Secondary | ICD-10-CM

## 2012-03-04 NOTE — Assessment & Plan Note (Signed)
Chronic. Truly intolerant of statins (even red yeast rice causes myalgias despite CoQ10). Consider zetia.  Taking fish oil as well.

## 2012-03-04 NOTE — Assessment & Plan Note (Signed)
Chronic, diet controlled. Congratulated on control of sugars with lifestyle changes! Continue to monitor.

## 2012-03-04 NOTE — Assessment & Plan Note (Signed)
No red flags. Refer to PT. If worsening, return for imaging and further eval.  Pt states PT has helped in past, requests specific physical therapist, states he will set this up.

## 2012-03-04 NOTE — Assessment & Plan Note (Signed)
Currently not bothering him.

## 2012-03-04 NOTE — Patient Instructions (Addendum)
Call your insurace about the shingles shot to see if it is covered or how much it would cost and where is cheaper (here or pharmacy).  If you want to receive here, call for nurse visit. Blood work today to check on hepatitis C - we will be in touch regarding next steps. Return in 6 months to recheck diabetes, blood pressure and cholesterol (return prior fasting). Good to see you today, call us with questions

## 2012-03-04 NOTE — Assessment & Plan Note (Addendum)
I have personally reviewed the Medicare Annual Wellness questionnaire and have noted 1. The patient's medical and social history 2. Their use of alcohol, tobacco or illicit drugs 3. Their current medications and supplements 4. The patient's functional ability including ADL's, fall risks, home safety risks and hearing or visual impairment. 5. Diet and physical activities 6. Evidence for depression or mood disorders The patients weight, height, BMI have been recorded in the chart.  Hearing and vision has been addressed. I have made referrals, counseling and provided education to the patient based review of the above and I have provided the pt with a written personalized care plan for preventive services. Body mass index is 28.46 kg/(m^2).  Due for colonoscopy 2014 Will look into zostavax, o/w UTD on all shots. Has received Hep A/B shots 2001. Passes hearing screen today.

## 2012-03-04 NOTE — Progress Notes (Signed)
Subjective:    Patient ID: Shawn Espinoza, male    DOB: 1941-12-06, 71 y.o.   MRN: [id number]  HPI CC: medicare wellness visit  Left eye surgery yesterday.  To see eye doctor again in 2 wks.  Vision screen deferred today  labwork checked for persistent transaminitis.  Ferritin normal.  LFTs actually returned to normal.  Hep C Ab returned positive.  Abs [place] from 12/2011 - marked fatty liver infiltration.  No mention of hepatomegaly.  Denies h/o IVDU, h/o blood transfusions.  States had eval by GI in past for hepatitis, told negative Hep C.  Has changed diet, started going to gym - A1c from 8 to 6.6%.  Lower back pain - longstanding.  Constant, not progressively worsening.  No paresthesias, numbness.  No bladder/bowel incontinence.  Occasional shooting pain down buttock to knees bilaterally.  Takes methocarbamol and NSAIDs for this.  Has had PT in past.  No fevers/chills.  Has been told has sciatica in past.  Requests PT referral.  Has had good response to PT in past.  Quit smoking 20 yrs ago.  Preventative: Colonoscopy - 2004.  Good for 10 yrs according to pt.  H/o external hemorrhoids. Prostate - checked yearly, no concerns.  PSA 12/2011 WNL. [facility]. Hep A and B 2001 done. Pneumonia shot 2007 Td 2006. zostavax - will look into this. Hearing screen normal today. No cancers in family.  Medications and allergies reviewed and updated in chart.  Past histories reviewed and updated if relevant as below. Patient Active Problem List  Diagnoses  . Type II or unspecified type diabetes mellitus without mention of complication, uncontrolled  . HYPERLIPIDEMIA  . NICOTINE ADDICTION  . HYPERTENSION  . HEMORRHOIDS  . DIVERTICULOSIS, COLON  . FATTY LIVER DISEASE  . HEART MURMUR, SYSTOLIC   Past Medical History  Diagnosis Date  . Diabetes mellitus, type 2   . Diverticulosis of colon   . Hyperlipidemia   . Hypertension   . Fatty liver 02/29/00    abd ultrasound   No past  surgical history on file. History  Substance Use Topics  . Smoking status: Former Games developer  . Smokeless tobacco: Never Used   Comment: 3 ppd x 30 years, 90pyh.  Quit 1990  . Alcohol Use: Yes     3-4 wine a day ( 8/03- 2 beers, 2 wines, 2 brandies daily)   Family History  Problem Relation Age of Onset  . Stroke Mother     multiple mini strokes  . Hypertension Mother   . Heart disease Maternal Grandfather     MI  . Stroke Paternal Grandmother    Allergies  Allergen Reactions  . Candesartan Cilexetil     REACTION: Muscle spasms  . Diltiazem Hcl     REACTION: Dizziness  . Doxazosin Mesylate Other (See Comments)    REACTION: H/A's  . Nifedipine     REACTION: Leg swelling  . Pravastatin Other (See Comments)    Muscle aches  . Red Yeast Rice     Muscle aches  . Rosuvastatin Other (See Comments)    REACTION: questionable: severe constipation  . Simvastatin Other (See Comments)    REACTION: Muscle aches   Current Outpatient Prescriptions on File Prior to Visit  Medication Sig Dispense Refill  . amLODipine (NORVASC) 5 MG tablet Take 1 tablet (5 mg total) by mouth daily. Take one by mouth at bedtime  90 tablet  3  . aspirin 81 MG EC tablet Take 81  mg by mouth daily.        Marland Kitchen atenolol (TENORMIN) 50 MG tablet Take 1 tablet (50 mg total) by mouth daily.  30 tablet  0  . Coenzyme Q10 (CO Q-10) 100 MG CAPS 2 (two) times daily. Take one by mouth daily      . Cyanocobalamin (VITAMIN B-12) 2000 MCG TBCR Take by mouth daily.        Marland Kitchen glucose blood test strip Use as instructed  100 each  6  . hydrocortisone (ANUSOL-HC) 25 MG suppository One suppository per rectum three times a day for one week then as needed       . ibuprofen (ADVIL,MOTRIN) 800 MG tablet Take 1 tablet (800 mg total) by mouth every 8 (eight) hours as needed for pain (sparingly). Take one tablet with meals as needed for pain,use sparingly  30 tablet  1  . Lancets Misc. MISC 1 application by Does not apply route 2 (two) times  daily.  100 each  6  . lisinopril (PRINIVIL,ZESTRIL) 40 MG tablet Take 1 tablet (40 mg total) by mouth daily.  90 tablet  3  . Multiple Vitamin (MULTIVITAMIN) tablet Take 1 tablet by mouth daily.        . Multiple Vitamins-Minerals (EYE VITAMINS PO) 2 (two) times daily. Due to macular degeneration      . Omega-3 Fatty Acids (FISH OIL) 1000 MG CAPS Take by mouth daily.        . vitamin C (ASCORBIC ACID) 500 MG tablet Take 500 mg by mouth daily.        . Digestive Enzymes (PAPAYA AND ENZYMES PO) Take by mouth as needed.         Review of Systems  Constitutional: Negative for fever, chills, activity change, appetite change, fatigue and unexpected weight change.  HENT: Negative for hearing loss and neck pain.   Eyes: Negative for visual disturbance.  Respiratory: Negative for cough, chest tightness, shortness of breath and wheezing.   Cardiovascular: Negative for chest pain, palpitations and leg swelling.  Gastrointestinal: Negative for nausea, vomiting, abdominal pain, diarrhea, constipation, blood in stool and abdominal distention.  Genitourinary: Negative for hematuria and difficulty urinating.  Musculoskeletal: Negative for myalgias and arthralgias.  Skin: Negative for rash.  Neurological: Negative for dizziness, seizures, syncope and headaches.  Hematological: Does not bruise/bleed easily.  Psychiatric/Behavioral: Negative for dysphoric mood. The patient is not nervous/anxious.       Objective:   Physical Exam  Nursing note and vitals reviewed. Constitutional: He is oriented to person, place, and time. He appears well-developed and well-nourished. No distress.  HENT:  Head: Normocephalic and atraumatic.  Right Ear: Hearing, tympanic membrane, external ear and ear canal normal.  Left Ear: Hearing, tympanic membrane, external ear and ear canal normal.  Nose: Nose normal.  Mouth/Throat: Oropharynx is clear and moist. No oropharyngeal exudate.  Eyes: Conjunctivae and EOM are normal.  Pupils are equal, round, and reactive to light. No scleral icterus.  Neck: Normal range of motion. Neck supple. No thyromegaly present.  Cardiovascular: Normal rate, regular rhythm and intact distal pulses.   Murmur (3/6 SEM best at apex) heard. Pulses:      Radial pulses are 2+ on the right side, and 2+ on the left side.  Pulmonary/Chest: Effort normal and breath sounds normal. No respiratory distress. He has no wheezes. He has no rales.  Abdominal: Soft. Bowel sounds are normal. He exhibits no distension and no mass. There is hepatomegaly (nontender.  2 fingerwidths below right  costal margin). There is no tenderness. There is no rebound and no guarding.  Genitourinary: Rectal exam shows external hemorrhoid (noninflamed anterior). Rectal exam shows no internal hemorrhoid, no fissure, no mass, no tenderness and anal tone normal. Guaiac negative stool. Prostate is enlarged (~30gm). Prostate is not tender.  Musculoskeletal: Normal range of motion. He exhibits no edema.       No midline lumbar or paraspinous mm tenderness Neg SLR. No pain with int/ext rotation at hips.   Lymphadenopathy:    He has no cervical adenopathy.  Neurological: He is alert and oriented to person, place, and time. He has normal strength. No sensory deficit. He exhibits normal muscle tone. Coordination normal.       CN grossly intact, station and gait intact  Skin: Skin is warm and dry. No rash noted.  Psychiatric: He has a normal mood and affect. His behavior is normal. Judgment and thought content normal.       Assessment & Plan:

## 2012-03-04 NOTE — Assessment & Plan Note (Signed)
Chronic.  Almost at goal. Improved with lifestyle changes.

## 2012-03-04 NOTE — Assessment & Plan Note (Addendum)
Discussed this, encouraged continued moderation with EtOH, rec continued lifestyle changes. Will pull old chart and review.

## 2012-03-04 NOTE — Assessment & Plan Note (Addendum)
Good portion of visit spent discussion positive Hep C Ab - confirmatory testing today. H/o DM, fatty liver, significant EtOH use.  [place] with fatty liver 12/2011. With diet/lifestyle changes, LFTs have actually returned to normal, which is reassuring.  also has decreased EtOH use and gotten control of DM. Lab Results  Component Value Date   PLT 141.0* 12/31/2011  mild thrombocytopenia, longstanding (since at least 2010).  Hepatomegaly on exam. continue to monitor. Will pull old chart to review GI consult that pt states done, anticipate around 2001.

## 2012-03-04 NOTE — Assessment & Plan Note (Signed)
?  worsening AS.  asxs currently. Continue to monitor.

## 2012-03-09 ENCOUNTER — Encounter: Payer: Self-pay | Admitting: Family Medicine

## 2012-03-16 ENCOUNTER — Encounter (INDEPENDENT_AMBULATORY_CARE_PROVIDER_SITE_OTHER): Payer: Medicare Other | Admitting: Ophthalmology

## 2012-03-16 DIAGNOSIS — H33309 Unspecified retinal break, unspecified eye: Secondary | ICD-10-CM

## 2012-04-08 ENCOUNTER — Encounter: Payer: Self-pay | Admitting: Family Medicine

## 2012-04-10 ENCOUNTER — Other Ambulatory Visit: Payer: Self-pay

## 2012-04-10 MED ORDER — ATENOLOL 50 MG PO TABS: 50.0000 mg | ORAL_TABLET | Freq: Every day | ORAL | Status: DC

## 2012-04-10 NOTE — Telephone Encounter (Signed)
pts wife request refill faxed to [facility] atenolol 50 mg #90 x 3. pts wife notified done while on phone.

## 2012-06-06 ENCOUNTER — Encounter: Payer: Self-pay | Admitting: Family Medicine

## 2012-06-15 ENCOUNTER — Other Ambulatory Visit: Payer: Self-pay

## 2012-06-15 NOTE — Telephone Encounter (Signed)
[name],pts wife request refill Robaxin and Ibuprofen sent [facility] [address].Please advise.

## 2012-06-16 MED ORDER — IBUPROFEN 800 MG PO TABS: 800.0000 mg | ORAL_TABLET | Freq: Three times a day (TID) | ORAL | Status: DC | PRN

## 2012-06-16 MED ORDER — METHOCARBAMOL 500 MG PO TABS: 500.0000 mg | ORAL_TABLET | Freq: Three times a day (TID) | ORAL | Status: DC

## 2012-07-22 ENCOUNTER — Ambulatory Visit (INDEPENDENT_AMBULATORY_CARE_PROVIDER_SITE_OTHER): Payer: Medicare Other | Admitting: Ophthalmology

## 2012-07-22 DIAGNOSIS — H43819 Vitreous degeneration, unspecified eye: Secondary | ICD-10-CM

## 2012-07-22 DIAGNOSIS — H2510 Age-related nuclear cataract, unspecified eye: Secondary | ICD-10-CM

## 2012-07-22 DIAGNOSIS — H33309 Unspecified retinal break, unspecified eye: Secondary | ICD-10-CM

## 2012-07-22 DIAGNOSIS — H3530 Unspecified macular degeneration: Secondary | ICD-10-CM

## 2012-07-28 ENCOUNTER — Encounter: Payer: Self-pay | Admitting: Family Medicine

## 2012-07-28 ENCOUNTER — Ambulatory Visit (INDEPENDENT_AMBULATORY_CARE_PROVIDER_SITE_OTHER): Payer: Medicare Other | Admitting: Family Medicine

## 2012-07-28 VITALS — BP 160/90 | HR 72 | Temp 98.1°F | Wt 185.8 lb

## 2012-07-28 DIAGNOSIS — J019 Acute sinusitis, unspecified: Secondary | ICD-10-CM

## 2012-07-28 DIAGNOSIS — I10 Essential (primary) hypertension: Secondary | ICD-10-CM

## 2012-07-28 MED ORDER — AMLODIPINE BESYLATE 10 MG PO TABS: 10.0000 mg | ORAL_TABLET | Freq: Every day | ORAL | Status: DC

## 2012-07-28 MED ORDER — AMOXICILLIN-POT CLAVULANATE 875-125 MG PO TABS: 1.0000 | ORAL_TABLET | Freq: Two times a day (BID) | ORAL | Status: AC

## 2012-07-28 NOTE — Assessment & Plan Note (Signed)
See pt instructions. Discussed likely viral. However as does have h/o sinus infections progressing to chest infections, sent abx to take in case not improving as expected.

## 2012-07-28 NOTE — Patient Instructions (Addendum)
You have a sinus infection. I would wait for a few days to see if this is going to improving on its own as could all be viral.  If not, take medicine as prescribed: augmentin twice daily for 10 days.   Push fluids and plenty of rest. Nasal saline irrigation or neti pot to help drain sinuses. May use simple mucinex (OR immediate release guaifenesin which is shorter acting version) with plenty of fluid to help mobilize mucous. Let us know if fever >101.5, trouble opening/closing mouth, difficulty swallowing, or worsening - you may need to be seen again. Make sure you dont' take any decongestant which will raise blood pressure. Blood pressure is a bit elevated - treat with increasing amlodipine to 10mg  daily (2 pills daily until new prescription).

## 2012-07-28 NOTE — Assessment & Plan Note (Signed)
Increased amlodipine to 10mg daily.

## 2012-07-28 NOTE — Progress Notes (Signed)
  Subjective:    Patient ID: Shawn Espinoza, male    DOB: Jan 22, 1941, 71 y.o.   MRN: [id number]  HPI CC: sinusitis?  4 d h/o head congestion, burning in sinuses and nasal cavity, started with sore throat.  Seems to be easing up.  Mild cough worse last few days.  Cough worse in morning.  + PNdrainage  Using saline nasal spray as well as ibuprofen.  No fevers/chills, ear or tooth pain, abd pain, n/v, rashes.  States gets yearly sinusitis that becomes chest infection.  Wife on MTX and orencia so worried about her immune system.  No smokers at home.  No h/o asthma/COPD.  Doing low carb diet.  Initially constipated but now better on small dose of daily miralax. Wt Readings from Last 3 Encounters:  07/28/12 185 lb 12 oz (84.256 kg)  03/04/12 192 lb 12 oz (87.431 kg)  01/06/12 192 lb 12 oz (87.431 kg)    H/o HTN - bp at home has been running 140-150 systolic.  Discussed increasing amlodipine to 10mg  daily, continue lisinopril  Review of Systems Per HPI    Objective:   Physical Exam  Nursing note and vitals reviewed. Constitutional: He appears well-developed and well-nourished. No distress.  HENT:  Head: Normocephalic and atraumatic.  Right Ear: Hearing, tympanic membrane, external ear and ear canal normal.  Left Ear: Hearing, tympanic membrane, external ear and ear canal normal.  Nose: Mucosal edema (congestion R>L nasal passage) present. No rhinorrhea. Right sinus exhibits maxillary sinus tenderness and frontal sinus tenderness. Left sinus exhibits maxillary sinus tenderness and frontal sinus tenderness.  Mouth/Throat: Uvula is midline, oropharynx is clear and moist and mucous membranes are normal. No oropharyngeal exudate, posterior oropharyngeal edema, posterior oropharyngeal erythema or tonsillar abscesses.  Eyes: Conjunctivae and EOM are normal. Pupils are equal, round, and reactive to light. No scleral icterus.  Neck: Normal range of motion. Neck supple.  Cardiovascular:  Normal rate, regular rhythm and intact distal pulses.   Murmur (3/6 SEM) heard. Pulmonary/Chest: Effort normal and breath sounds normal. No respiratory distress. He has no wheezes. He has no rales.  Lymphadenopathy:    He has no cervical adenopathy.  Skin: Skin is warm and dry. No rash noted.       Assessment & Plan:

## 2012-07-29 ENCOUNTER — Telehealth: Payer: Self-pay

## 2012-07-29 NOTE — Telephone Encounter (Signed)
Rx sent to mail order. [facility] [address] and message left notifying patient.

## 2012-07-29 NOTE — Telephone Encounter (Signed)
Pt left v/m saw Dr Reece Agar on 07/28/12. Antibiotic not at [facility] [address]Please advise.

## 2012-08-19 ENCOUNTER — Other Ambulatory Visit: Payer: Self-pay | Admitting: Family Medicine

## 2012-08-19 DIAGNOSIS — E785 Hyperlipidemia, unspecified: Secondary | ICD-10-CM

## 2012-08-19 DIAGNOSIS — I10 Essential (primary) hypertension: Secondary | ICD-10-CM

## 2012-08-19 DIAGNOSIS — E119 Type 2 diabetes mellitus without complications: Secondary | ICD-10-CM

## 2012-08-19 DIAGNOSIS — D751 Secondary polycythemia: Secondary | ICD-10-CM

## 2012-09-02 ENCOUNTER — Other Ambulatory Visit (INDEPENDENT_AMBULATORY_CARE_PROVIDER_SITE_OTHER): Payer: Medicare Other

## 2012-09-02 DIAGNOSIS — D751 Secondary polycythemia: Secondary | ICD-10-CM

## 2012-09-02 DIAGNOSIS — E785 Hyperlipidemia, unspecified: Secondary | ICD-10-CM

## 2012-09-02 DIAGNOSIS — E119 Type 2 diabetes mellitus without complications: Secondary | ICD-10-CM

## 2012-09-02 DIAGNOSIS — D45 Polycythemia vera: Secondary | ICD-10-CM

## 2012-09-02 LAB — CBC WITH DIFFERENTIAL/PLATELET
Basophils Absolute: 0 10*3/uL (ref 0.0–0.1)
Eosinophils Absolute: 0.3 10*3/uL (ref 0.0–0.7)
Lymphocytes Relative: 22.8 % (ref 12.0–46.0)
MCHC: 34 g/dL (ref 30.0–36.0)
Neutrophils Relative %: 63.3 % (ref 43.0–77.0)
Platelets: 119 10*3/uL — ABNORMAL LOW (ref 150.0–400.0)
RDW: 13.1 % (ref 11.5–14.6)

## 2012-09-03 LAB — COMPREHENSIVE METABOLIC PANEL
ALT: 20 U/L (ref 0–53)
Albumin: 4.1 g/dL (ref 3.5–5.2)
CO2: 26 mEq/L (ref 19–32)
Calcium: 8.7 mg/dL (ref 8.4–10.5)
Chloride: 103 mEq/L (ref 96–112)
Creatinine, Ser: 0.8 mg/dL (ref 0.4–1.5)
GFR: 102.53 mL/min (ref >60.00)
Sodium: 139 mEq/L (ref 135–145)
Total Protein: 6.4 g/dL (ref 6.0–8.3)

## 2012-09-03 LAB — LIPID PANEL
Cholesterol: 203 mg/dL — ABNORMAL HIGH (ref 0–200)
Total CHOL/HDL Ratio: 4

## 2012-09-09 ENCOUNTER — Encounter: Payer: Self-pay | Admitting: Family Medicine

## 2012-09-09 ENCOUNTER — Ambulatory Visit (INDEPENDENT_AMBULATORY_CARE_PROVIDER_SITE_OTHER): Payer: Medicare Other | Admitting: Family Medicine

## 2012-09-09 VITALS — BP 140/74 | HR 68 | Temp 98.1°F | Wt 184.8 lb

## 2012-09-09 DIAGNOSIS — N400 Enlarged prostate without lower urinary tract symptoms: Secondary | ICD-10-CM

## 2012-09-09 DIAGNOSIS — K7689 Other specified diseases of liver: Secondary | ICD-10-CM

## 2012-09-09 DIAGNOSIS — I10 Essential (primary) hypertension: Secondary | ICD-10-CM

## 2012-09-09 DIAGNOSIS — E1149 Type 2 diabetes mellitus with other diabetic neurological complication: Secondary | ICD-10-CM

## 2012-09-09 DIAGNOSIS — E1142 Type 2 diabetes mellitus with diabetic polyneuropathy: Secondary | ICD-10-CM

## 2012-09-09 DIAGNOSIS — E785 Hyperlipidemia, unspecified: Secondary | ICD-10-CM

## 2012-09-09 DIAGNOSIS — Z23 Encounter for immunization: Secondary | ICD-10-CM

## 2012-09-09 MED ORDER — TAMSULOSIN HCL 0.4 MG PO CAPS: 0.4000 mg | ORAL_CAPSULE | Freq: Every day | ORAL | Status: DC

## 2012-09-09 NOTE — Progress Notes (Signed)
Subjective:    Patient ID: Shawn Espinoza, male    DOB: 1941-11-10, 71 y.o.   MRN: [id number]  HPI CC: 24mo f/u  H/o hep C ab positive but confirmatory testing negative.  H/o transaminitis and fatty liver.  H/o hepatomegaly.  Last check LFTs normal.  Would like right ear checked - wax.  Bladder trouble - more leaking recently.  No stress sxs.  Worse in am and at night.  Nocturia x3 nightly.  Weakening of stream noted.  Foot on floor.  Happening more over the past year.  Prostate exam with enlarged prostate last AWV (02/2012).  Is doing kegel exercises.  + urgency. Lab Results  Component Value Date   PSA 1.36 12/31/2011   PSA 1.23 02/20/2011   PSA 0.97 12/29/2009    HLD - intolerant to all statins, even red yeast rice.  Was on coq10.  HTN - last visit amlodipine increased to 10mg  daily.  Has noticed more flushing in face.  Notices every day.  No HA, vision changes, CP/tightness, SOB, leg swelling.  DM - sugars checked 2x/wk, running 120-130 fasting.  No low sugars.  Watched diet and increased exercise.  Works at [employer] course, plays golf, goes to [employer].  Foot exam today.  Last eye doctor appt was 03/2012.  Endorses paresthesias of toes L>R.  Prior A1c up to 8.4%, now with compliant diet and lifestyle changes: Lab Results  Component Value Date   HGBA1C 5.4 09/02/2012   Wt Readings from Last 3 Encounters:  09/09/12 184 lb 12 oz (83.802 kg)  07/28/12 185 lb 12 oz (84.256 kg)  03/04/12 192 lb 12 oz (87.431 kg)   Flu shot today. Shingles shot today.  No falls in last year.  Past Medical History  Diagnosis Date  . Type 2 diabetes, controlled, with neuropathy     diet controlled  . Diverticulosis of colon   . Hyperlipidemia   . Hypertension   . Fatty liver 02/29/00    abd ultrasound  . Lower back pain   . Systolic murmur     echo 2010 - normal LV fxn, EF 55%, mild AS, biatrial enlargement  . History of tobacco use quit 1990s  . Positive hepatitis C antibody test 2013    HCV  RNA negative - ?cleared infection     Lab Results  Component Value Date   WBC 6.0 09/02/2012   HGB 16.1 09/02/2012   HCT 47.4 09/02/2012   MCV 95.4 09/02/2012   PLT 119.0* 09/02/2012    Review of Systems Per HPI    Objective:   Physical Exam  Nursing note and vitals reviewed. Constitutional: He appears well-developed and well-nourished. No distress.  HENT:  Head: Normocephalic and atraumatic.  Right Ear: Hearing, tympanic membrane, external ear and ear canal normal.  Left Ear: Hearing, tympanic membrane, external ear and ear canal normal.  Mouth/Throat: Oropharynx is clear and moist. No oropharyngeal exudate.  Eyes: Conjunctivae normal and EOM are normal. Pupils are equal, round, and reactive to light. No scleral icterus.  Neck: Normal range of motion. Neck supple.  Cardiovascular: Normal rate, regular rhythm and intact distal pulses.   Murmur (3/6 SEM, stable) heard. Pulmonary/Chest: Effort normal and breath sounds normal. No respiratory distress. He has no wheezes. He has no rales.  Abdominal: Soft. Bowel sounds are normal. He exhibits no distension and no mass. There is hepatomegaly (14fw below costal margin). There is no tenderness. There is no rebound, no guarding and no CVA tenderness.  Musculoskeletal:  He exhibits no edema.       Diabetic foot exam: Normal inspection No skin breakdown No calluses  Normal DP/PT pulses Normal sensation to light touch and monofilament Nails normal   Skin: Skin is warm and dry. No rash noted.       Assessment & Plan:

## 2012-09-09 NOTE — Addendum Note (Signed)
Addended by: Josph Macho A on: 09/09/2012 11:33 AM   Modules accepted: Orders

## 2012-09-09 NOTE — Assessment & Plan Note (Addendum)
New.  Urinary urgency and some leaking.  Known BPH. Trial of flomax - discussed possible hypotension with this.   Discussed monitor for HA as prior HA to cardura (according to chart) If not improved, consider trial of anticholinergic vs referral to urology for UDS in h/o diabetes, peripheral neuropathy.

## 2012-09-09 NOTE — Patient Instructions (Signed)
Shingles and flu shots today. Try flomax 0.4 mg daily for urinary symptoms, let me know if not helping at all.  Try 1/2 pill at first.  - watch for low blood pressure on flomax, watch for headaches. We will need to keep close eye on liver - given history of fatty liver. Good to see you today, call us with questions.

## 2012-09-09 NOTE — Assessment & Plan Note (Signed)
Chronic, stable. Great control with diet/lifestyle changes.  Pt and wife been very cognisant of food choices. Continue current lifestyle regimen.

## 2012-09-09 NOTE — Assessment & Plan Note (Signed)
Chronic, stable.  Overall adequate.  Concern for symptomatic hypotension if tighter control. Currently endorsing some flushing from increased amlodipine and mild orthostatic lightheadedness but feels tolerable. Continue meds. Discussed precautions with starting flomax.

## 2012-09-09 NOTE — Assessment & Plan Note (Signed)
Chronic, stable. LDL not at goal given h/o DM, but intolerant to all statins including red yeast rice. Significant improvement in trig and HLD with lifestyle changes. Encouraged continued treatment plan. discussed foods that can lower LDL

## 2012-09-09 NOTE — Assessment & Plan Note (Signed)
With h/o significant alcohol use.  Discussed recommendation to continue tapering EtOH use down.  Currently drinking 3 glasses of wine/day. LFTs reassuring.  Stable mild thrombocytopenia.  Check coags next blood draw. Avoids tylenol. Takes multivitamin daily.  Last year B12 normal. Discussed NASH vs HS and how NASH can lead to cirrhosis.  Seems more consistent with HS currently, but if any indication of persistent transaminitis, discussed referral to GI.

## 2012-10-04 ENCOUNTER — Other Ambulatory Visit: Payer: Self-pay | Admitting: Family Medicine

## 2012-11-18 ENCOUNTER — Encounter: Payer: Self-pay | Admitting: Family Medicine

## 2012-11-18 ENCOUNTER — Ambulatory Visit (INDEPENDENT_AMBULATORY_CARE_PROVIDER_SITE_OTHER): Payer: Medicare Other | Admitting: Family Medicine

## 2012-11-18 VITALS — BP 130/80 | HR 84 | Temp 98.0°F | Wt 185.8 lb

## 2012-11-18 DIAGNOSIS — M25561 Pain in right knee: Secondary | ICD-10-CM | POA: Insufficient documentation

## 2012-11-18 DIAGNOSIS — M25569 Pain in unspecified knee: Secondary | ICD-10-CM

## 2012-11-18 MED ORDER — IBUPROFEN 800 MG PO TABS: 800.0000 mg | ORAL_TABLET | Freq: Two times a day (BID) | ORAL | Status: DC | PRN

## 2012-11-18 NOTE — Assessment & Plan Note (Signed)
With positive mcmurray's - anticipate small meniscal injury.  No locking, no falls. As improving over last week, continue conservative management. Provided with stretching exercises from [facility] pt advisor. Discussed if any worsening or not improving as expected, recommend referral to ortho for further evaluation. Pt agrees with plan.

## 2012-11-18 NOTE — Patient Instructions (Addendum)
I think you do have meniscal injury - as it seems to be getting better, continue treatment as up to now.  Stretching exercises provided. Ibuprofen refilled. Wear brace whenever you're going to work knee. If not improving as expected, let me know for referral to orthopedist. Good to see you today ,call us with questions.

## 2012-11-18 NOTE — Progress Notes (Signed)
  Subjective:    Patient ID: Shawn Espinoza, male    DOB: Feb 24, 1941, 71 y.o.   MRN: [id number]  HPI CC: R knee pain  1.5 mo h/o R knee pain.  Describes lateral lower anterior knee pain with radiation down anterior leg.  Worse pain first few steps after getting out of bed.  Walking ok.  Worse with going up/down hills and stairs.  + instability of R knee.  No locking.  Avid golfer. Golf swing interestingly does not bother him.   Denies inciting trauma/falls.  No h/o knee problems in past.  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  For last week, backed off driving tractor and stretching leg, using vibrator on anterior knee, seems to be improving.  Has tried icing (iced for 1 week).  Has tried pennsaid which didn't seem to help. Using ibuprofen 800mg  for last month daily. Has bee using brace.  Past Medical History  Diagnosis Date  . Type 2 diabetes, controlled, with neuropathy     diet controlled  . Diverticulosis of colon   . Hyperlipidemia   . Hypertension   . Fatty liver 02/29/00    abd ultrasound  . Lower back pain   . Systolic murmur     echo 2010 - normal LV fxn, EF 55%, mild AS, biatrial enlargement  . History of tobacco use quit 1990s  . Positive hepatitis C antibody test 2013    HCV RNA negative - ?cleared infection    Past Surgical History  Procedure Date  . Colonoscopy 2004    rpt due 2014   Review of Systems Per HPI    Objective:   Physical Exam  Nursing note and vitals reviewed. Musculoskeletal:       L Knee WNL R Knee: No erythema, swelling, deformity or pain to palpation of knee joint.  No crepitus, FROM in extension and flexion without discomfort Neg drawer test. Mild discomfort with varus testing. ++ mcmurray's on right. Neg PFgrind, no abnormal patellar mobility       Assessment & Plan:

## 2012-11-24 ENCOUNTER — Telehealth: Payer: Self-pay

## 2012-11-24 DIAGNOSIS — M25561 Pain in right knee: Secondary | ICD-10-CM

## 2012-11-24 NOTE — Telephone Encounter (Signed)
Placed referral in chart. 

## 2012-11-24 NOTE — Telephone Encounter (Signed)
Lady left v/m requesting orthopedic referral due to pain in knee (pain is worse than when seen). Request appt at [facility] orthopedics.Please advise.

## 2012-12-14 ENCOUNTER — Telehealth: Payer: Self-pay

## 2012-12-14 MED ORDER — MILK THISTLE 150 MG PO CAPS: 1.0000 | ORAL_CAPSULE | Freq: Every day | ORAL | Status: DC

## 2012-12-14 NOTE — Telephone Encounter (Signed)
Should be ok to take but recommend close monitoring of sugars while starting supplement.

## 2012-12-14 NOTE — Telephone Encounter (Signed)
Patient notified and will monitor sugars.

## 2012-12-14 NOTE — Telephone Encounter (Signed)
pts wife left v/m; pt wants to take milk thistle supplement; pts wife wants to make sure will not cause BS to go up or any reason why pt should not take supplement. Please advise.

## 2012-12-23 HISTORY — PX: COLONOSCOPY: SHX174

## 2013-01-04 ENCOUNTER — Other Ambulatory Visit: Payer: Self-pay | Admitting: *Deleted

## 2013-01-04 MED ORDER — LISINOPRIL 40 MG PO TABS: 40.0000 mg | ORAL_TABLET | Freq: Every day | ORAL | Status: DC

## 2013-03-01 ENCOUNTER — Other Ambulatory Visit: Payer: Self-pay | Admitting: Family Medicine

## 2013-03-01 DIAGNOSIS — N400 Enlarged prostate without lower urinary tract symptoms: Secondary | ICD-10-CM

## 2013-03-01 DIAGNOSIS — E1142 Type 2 diabetes mellitus with diabetic polyneuropathy: Secondary | ICD-10-CM

## 2013-03-01 DIAGNOSIS — D696 Thrombocytopenia, unspecified: Secondary | ICD-10-CM

## 2013-03-01 DIAGNOSIS — E785 Hyperlipidemia, unspecified: Secondary | ICD-10-CM

## 2013-03-01 DIAGNOSIS — I10 Essential (primary) hypertension: Secondary | ICD-10-CM

## 2013-03-03 ENCOUNTER — Other Ambulatory Visit (INDEPENDENT_AMBULATORY_CARE_PROVIDER_SITE_OTHER): Payer: Medicare Other

## 2013-03-03 DIAGNOSIS — E1142 Type 2 diabetes mellitus with diabetic polyneuropathy: Secondary | ICD-10-CM

## 2013-03-03 DIAGNOSIS — N400 Enlarged prostate without lower urinary tract symptoms: Secondary | ICD-10-CM

## 2013-03-03 DIAGNOSIS — E1149 Type 2 diabetes mellitus with other diabetic neurological complication: Secondary | ICD-10-CM

## 2013-03-03 DIAGNOSIS — D696 Thrombocytopenia, unspecified: Secondary | ICD-10-CM

## 2013-03-03 DIAGNOSIS — I10 Essential (primary) hypertension: Secondary | ICD-10-CM

## 2013-03-03 DIAGNOSIS — E785 Hyperlipidemia, unspecified: Secondary | ICD-10-CM

## 2013-03-03 LAB — CBC WITH DIFFERENTIAL/PLATELET
Basophils Absolute: 0 10*3/uL (ref 0.0–0.1)
Eosinophils Absolute: 0.3 10*3/uL (ref 0.0–0.7)
HCT: 48.4 % (ref 39.0–52.0)
Hemoglobin: 16.9 g/dL (ref 13.0–17.0)
Lymphocytes Relative: 28.9 % (ref 12.0–46.0)
Lymphs Abs: 1.7 10*3/uL (ref 0.7–4.0)
MCHC: 35 g/dL (ref 30.0–36.0)
Neutro Abs: 3.5 10*3/uL (ref 1.4–7.7)
Platelets: 138 10*3/uL — ABNORMAL LOW (ref 150.0–400.0)
RDW: 12.9 % (ref 11.5–14.6)

## 2013-03-03 LAB — PSA: PSA: 1.28 ng/mL (ref 0.10–4.00)

## 2013-03-03 LAB — LIPID PANEL: Cholesterol: 195 mg/dL (ref 0–200)

## 2013-03-03 LAB — MICROALBUMIN / CREATININE URINE RATIO
Microalb Creat Ratio: 4.1 mg/g (ref 0.0–30.0)
Microalb, Ur: 8.9 mg/dL — ABNORMAL HIGH (ref 0.0–1.9)

## 2013-03-03 LAB — COMPREHENSIVE METABOLIC PANEL
AST: 22 U/L (ref 0–37)
Albumin: 4.2 g/dL (ref 3.5–5.2)
Alkaline Phosphatase: 53 U/L (ref 39–117)
BUN: 21 mg/dL (ref 6–23)
Creatinine, Ser: 0.9 mg/dL (ref 0.4–1.5)
Glucose, Bld: 158 mg/dL — ABNORMAL HIGH (ref 70–99)
Potassium: 5.2 mEq/L — ABNORMAL HIGH (ref 3.5–5.1)

## 2013-03-03 LAB — TSH: TSH: 2.92 u[IU]/mL (ref 0.35–5.50)

## 2013-03-03 LAB — HEMOGLOBIN A1C: Hgb A1c MFr Bld: 5.4 % (ref 4.6–6.5)

## 2013-03-08 ENCOUNTER — Other Ambulatory Visit: Payer: Self-pay | Admitting: *Deleted

## 2013-03-08 MED ORDER — ATENOLOL 50 MG PO TABS: 50.0000 mg | ORAL_TABLET | Freq: Every day | ORAL | Status: DC

## 2013-03-10 ENCOUNTER — Encounter: Payer: Self-pay | Admitting: Family Medicine

## 2013-03-10 ENCOUNTER — Ambulatory Visit (INDEPENDENT_AMBULATORY_CARE_PROVIDER_SITE_OTHER): Payer: Medicare Other | Admitting: Family Medicine

## 2013-03-10 VITALS — BP 158/78 | HR 60 | Temp 98.1°F | Ht 67.5 in | Wt 185.0 lb

## 2013-03-10 DIAGNOSIS — D696 Thrombocytopenia, unspecified: Secondary | ICD-10-CM

## 2013-03-10 DIAGNOSIS — R011 Cardiac murmur, unspecified: Secondary | ICD-10-CM

## 2013-03-10 DIAGNOSIS — E785 Hyperlipidemia, unspecified: Secondary | ICD-10-CM

## 2013-03-10 DIAGNOSIS — E1142 Type 2 diabetes mellitus with diabetic polyneuropathy: Secondary | ICD-10-CM

## 2013-03-10 DIAGNOSIS — Z0000 Encounter for general adult medical examination without abnormal findings: Secondary | ICD-10-CM

## 2013-03-10 DIAGNOSIS — I10 Essential (primary) hypertension: Secondary | ICD-10-CM

## 2013-03-10 DIAGNOSIS — Z1331 Encounter for screening for depression: Secondary | ICD-10-CM

## 2013-03-10 DIAGNOSIS — R768 Other specified abnormal immunological findings in serum: Secondary | ICD-10-CM

## 2013-03-10 DIAGNOSIS — R894 Abnormal immunological findings in specimens from other organs, systems and tissues: Secondary | ICD-10-CM

## 2013-03-10 DIAGNOSIS — E1149 Type 2 diabetes mellitus with other diabetic neurological complication: Secondary | ICD-10-CM

## 2013-03-10 MED ORDER — ATENOLOL 50 MG PO TABS: 50.0000 mg | ORAL_TABLET | Freq: Every day | ORAL | Status: DC

## 2013-03-10 MED ORDER — LISINOPRIL 40 MG PO TABS: 40.0000 mg | ORAL_TABLET | Freq: Every day | ORAL | Status: DC

## 2013-03-10 MED ORDER — AMLODIPINE BESYLATE 10 MG PO TABS: 10.0000 mg | ORAL_TABLET | Freq: Every day | ORAL | Status: DC

## 2013-03-10 NOTE — Progress Notes (Signed)
Subjective:    Patient ID: Shawn Espinoza, male    DOB: 01/14/1941, 72 y.o.   MRN: [id number]  HPI CC: medicare wellness  HTN - recent increase in amlodipine to 10mg  daily.  Notes occasional dizziness.   BP Readings from Last 3 Encounters:  03/10/13 158/78  11/18/12 130/80  09/09/12 140/74   Changed to gluten diet.  HLD - intolerant to all statins including RYR.  Has been trying to control with diet change.  Hearing and vision screen passed today. Denies falls, depression/anhedonia  Preventative: Colonoscopy - 2004. Good for 10 yrs according to pt. H/o external hemorrhoids. Requests repeat colonoscopy, does not want iFOB. Prostate - checked yearly, no concerns. PSA 12/2012 WNL. [facility] 2013 Hep A and B shots done 2001. Pneumonia shot 2007. Td 2006. zostavax - 08/2012 No cancers in family. Advanced directives: living will discussed.  Full code.  Wouldn't want prolonged artificial life support.    Medications and allergies reviewed and updated in chart.  Past histories reviewed and updated if relevant as below. Patient Active Problem List  Diagnosis  . Type 2 diabetes, controlled, with peripheral neuropathy  . HYPERLIPIDEMIA  . History of tobacco use  . HYPERTENSION  . HEMORRHOIDS  . DIVERTICULOSIS, COLON  . FATTY LIVER DISEASE  . Medicare annual wellness visit, initial  . Positive hepatitis C antibody test  . Systolic murmur  . Lower back pain  . Polycythemia  . BPH (benign prostatic hypertrophy)  . Right knee pain   Past Medical History  Diagnosis Date  . Type 2 diabetes, controlled, with neuropathy     diet controlled  . Diverticulosis of colon   . Hyperlipidemia   . Hypertension   . Fatty liver 02/29/00    abd ultrasound  . Lower back pain   . Systolic murmur     echo 2010 - normal LV fxn, EF 55%, mild AS, biatrial enlargement  . History of tobacco use quit 1990s  . Positive hepatitis C antibody test 2013    HCV RNA negative - ?cleared  infection   Past Surgical History  Procedure Laterality Date  . Colonoscopy  2004    rpt due 2014   History  Substance Use Topics  . Smoking status: Former Games developer  . Smokeless tobacco: Never Used     Comment: 3 ppd x 30 years, 90pyh.  Quit 1990  . Alcohol Use: Yes     Comment: 3-4 wine a day ( 8/03- 2 beers, 2 wines, 2 brandies daily)   Family History  Problem Relation Age of Onset  . Stroke Mother     multiple mini strokes  . Hypertension Mother   . Heart disease Maternal Grandfather     MI  . Stroke Paternal Grandmother    Allergies  Allergen Reactions  . Candesartan Cilexetil     REACTION: Muscle spasms  . Diltiazem Hcl     REACTION: Dizziness  . Doxazosin Mesylate Other (See Comments)    REACTION: H/A's  . Nifedipine     REACTION: Leg swelling  . Pravastatin Other (See Comments)    Muscle aches  . Red Yeast Rice     Muscle aches  . Rosuvastatin Other (See Comments)    REACTION: questionable: severe constipation  . Simvastatin Other (See Comments)    REACTION: Muscle aches   Current Outpatient Prescriptions on File Prior to Visit  Medication Sig Dispense Refill  . aspirin 81 MG EC tablet Take 81 mg by mouth daily.        Marland Kitchen  Lancets Misc. MISC 1 application by Does not apply route 2 (two) times daily.  100 each  6  . Multiple Vitamin (MULTIVITAMIN) tablet Take 1 tablet by mouth daily.        . Multiple Vitamins-Minerals (EYE VITAMINS PO) 2 (two) times daily. Due to macular degeneration      . Polyethylene Glycol 3350 (MIRALAX PO) Take by mouth as directed. Takes 1/2 dose PRN      . vitamin C (ASCORBIC ACID) 500 MG tablet Take 250 mg by mouth daily.       . Digestive Enzymes (PAPAYA AND ENZYMES PO) Take by mouth as needed.        Marland Kitchen ibuprofen (ADVIL,MOTRIN) 800 MG tablet Take 1 tablet (800 mg total) by mouth 2 (two) times daily as needed for pain.  30 tablet  0  . methocarbamol (ROBAXIN) 500 MG tablet Take 1 tablet (500 mg total) by mouth 3 (three) times daily.  90  tablet  3   No current facility-administered medications on file prior to visit.    Review of Systems  Constitutional: Negative for fever, chills, activity change, appetite change, fatigue and unexpected weight change.  HENT: Negative for hearing loss and neck pain.   Eyes: Negative for visual disturbance.  Respiratory: Negative for cough, chest tightness, shortness of breath and wheezing.   Cardiovascular: Negative for chest pain, palpitations and leg swelling.  Gastrointestinal: Negative for nausea, vomiting, abdominal pain, diarrhea, constipation, blood in stool and abdominal distention.  Genitourinary: Negative for hematuria and difficulty urinating.  Musculoskeletal: Negative for myalgias and arthralgias.  Skin: Negative for rash.  Neurological: Negative for dizziness, seizures, syncope and headaches.  Hematological: Does not bruise/bleed easily.  Psychiatric/Behavioral: Negative for dysphoric mood. The patient is not nervous/anxious.        Objective:   Physical Exam  Nursing note and vitals reviewed. Constitutional: He is oriented to person, place, and time. He appears well-developed and well-nourished. No distress.  HENT:  Head: Normocephalic and atraumatic.  Right Ear: Hearing, tympanic membrane, external ear and ear canal normal.  Left Ear: Hearing, tympanic membrane, external ear and ear canal normal.  Nose: Nose normal.  Mouth/Throat: Oropharynx is clear and moist. No oropharyngeal exudate.  Eyes: Conjunctivae and EOM are normal. Pupils are equal, round, and reactive to light. No scleral icterus.  Neck: Normal range of motion. Neck supple. Carotid bruit is not present.  Cardiovascular: Normal rate, regular rhythm and intact distal pulses.   Murmur (3/6 systolic murmur best at apex) heard. Pulses:      Radial pulses are 2+ on the right side, and 2+ on the left side.  Pulmonary/Chest: Effort normal and breath sounds normal. No respiratory distress. He has no wheezes. He  has no rales.  Abdominal: Soft. Bowel sounds are normal. He exhibits no distension and no mass. There is no tenderness. There is no rebound and no guarding.  Genitourinary: Prostate normal. Rectal exam shows external hemorrhoid. Rectal exam shows no internal hemorrhoid, no fissure, no mass, no tenderness and anal tone normal. Prostate is not enlarged (15-20gm) and not tender.  Musculoskeletal: Normal range of motion. He exhibits no edema.  Lymphadenopathy:    He has no cervical adenopathy.  Neurological: He is alert and oriented to person, place, and time.  CN grossly intact, station and gait intact  Skin: Skin is warm and dry. No rash noted.  Psychiatric: He has a normal mood and affect. His behavior is normal. Judgment and thought content normal.  Assessment & Plan:

## 2013-03-10 NOTE — Assessment & Plan Note (Signed)
Chronic, stable. I've asked him to monitor at home and notify me if staying elevated.

## 2013-03-10 NOTE — Assessment & Plan Note (Signed)
Adequate #s, diet controlled.

## 2013-03-10 NOTE — Assessment & Plan Note (Signed)
I have personally reviewed the Medicare Annual Wellness questionnaire and have noted 1. The patient's medical and social history 2. Their use of alcohol, tobacco or illicit drugs 3. Their current medications and supplements 4. The patient's functional ability including ADL's, fall risks, home safety risks and hearing or visual impairment. 5. Diet and physical activity 6. Evidence for depression or mood disorders The patients weight, height, BMI have been recorded in the chart.  Hearing and vision has been addressed. I have made referrals, counseling and provided education to the patient based review of the above and I have provided the pt with a written personalized care plan for preventive services. See scanned questionairre. Advanced directives discussed: does not have set up but is full code.  Reviewed preventative protocols and updated unless pt declined. Blood work reviewed in detail

## 2013-03-10 NOTE — Patient Instructions (Signed)
Let's keep an eye on blood pressure - especially if feeling dizzy to ensure not being caused by low blood pressures. Good to see you today, call us with questions. Return as needed or in 1 year for next physical. Work on bad cholesterol level with diet changes.

## 2013-03-10 NOTE — Assessment & Plan Note (Signed)
Continue to monitor.  Chronic.

## 2013-03-10 NOTE — Assessment & Plan Note (Signed)
Stable murmur. asxs.

## 2013-03-10 NOTE — Assessment & Plan Note (Signed)
Chronic. Great control with diet /lifestyle changes. Actually, last several A1cs have been normal range off meds. Will change diagnosis to h/o DM.

## 2013-04-07 ENCOUNTER — Encounter: Payer: Self-pay | Admitting: Family Medicine

## 2013-04-07 LAB — HM DIABETES EYE EXAM

## 2013-04-14 ENCOUNTER — Ambulatory Visit (INDEPENDENT_AMBULATORY_CARE_PROVIDER_SITE_OTHER): Payer: Medicare Other | Admitting: Family Medicine

## 2013-04-14 DIAGNOSIS — Z1211 Encounter for screening for malignant neoplasm of colon: Secondary | ICD-10-CM

## 2013-04-14 NOTE — Progress Notes (Signed)
appt cancelled

## 2013-04-19 ENCOUNTER — Encounter: Payer: Self-pay | Admitting: Internal Medicine

## 2013-05-04 ENCOUNTER — Telehealth: Payer: Self-pay

## 2013-05-04 NOTE — Telephone Encounter (Signed)
Diltiazem is a benzothiazepine - he has listed allergy to this. Also, already on amlodipine which is similar to this. How are bp's running?   Would start with tonic water at night to see if improved leg cramps. If not, may discuss trial of another medication.

## 2013-05-04 NOTE — Telephone Encounter (Signed)
Pt has had leg cramps for years and was reading an article about lisinopril and atenolol can cause leg cramps. Article recommended replacing these two meds with benzothiazepine. Pt wants Dr Timoteo Expose opinion; pt added if does try benzothiazepine would like to start at lose dosage and build up dosage if needed. [facility] [address] Pt request call back.

## 2013-05-05 NOTE — Telephone Encounter (Signed)
Patient and his wife were notified. He will try the tonic water and see if that helps. If not, he will call for appt to discuss meds. BP's have been running good-this AM 127/74.

## 2013-05-19 ENCOUNTER — Encounter: Payer: Medicare Other | Admitting: Internal Medicine

## 2013-06-10 ENCOUNTER — Other Ambulatory Visit: Payer: Self-pay | Admitting: Family Medicine

## 2013-06-23 ENCOUNTER — Telehealth: Payer: Self-pay | Admitting: *Deleted

## 2013-06-23 ENCOUNTER — Ambulatory Visit (AMBULATORY_SURGERY_CENTER): Payer: Medicare Other | Admitting: *Deleted

## 2013-06-23 VITALS — Ht 67.5 in | Wt 184.4 lb

## 2013-06-23 DIAGNOSIS — Z1211 Encounter for screening for malignant neoplasm of colon: Secondary | ICD-10-CM

## 2013-06-23 MED ORDER — NA SULFATE-K SULFATE-MG SULF 17.5-3.13-1.6 GM/177ML PO SOLN: ORAL | Status: DC

## 2013-06-23 NOTE — Telephone Encounter (Signed)
Faxed ROI to Dr. Danise Edge at fax # [phone].

## 2013-06-23 NOTE — Telephone Encounter (Signed)
Patient is for direct screening colonoscopy with Dr.Gessner on 06-30-13. He states he had colonoscopy with Dr.Johnson in 2004. Patient states he had "small polyps removed" but was told not to have this exam for 10 years. Patient signed release of information form and this was given to P.J. CMA. Also phone note sent. Thanks.

## 2013-06-23 NOTE — Progress Notes (Signed)
Patient states he had previous colonoscopy in 2004 with Dr.Martin Laural Benes. Pt states he had "small polyps removed" but was told to repeat exam in 10 years. Release of information form signed and phone note sent to P.J.,CMA.

## 2013-06-24 ENCOUNTER — Encounter: Payer: Self-pay | Admitting: Internal Medicine

## 2013-06-28 ENCOUNTER — Encounter: Payer: Self-pay | Admitting: Internal Medicine

## 2013-06-30 ENCOUNTER — Encounter: Payer: Self-pay | Admitting: Internal Medicine

## 2013-06-30 ENCOUNTER — Ambulatory Visit (AMBULATORY_SURGERY_CENTER): Payer: Medicare Other | Admitting: Internal Medicine

## 2013-06-30 VITALS — BP 132/73 | HR 63 | Temp 97.8°F | Resp 15 | Ht 67.0 in | Wt 184.0 lb

## 2013-06-30 DIAGNOSIS — K5730 Diverticulosis of large intestine without perforation or abscess without bleeding: Secondary | ICD-10-CM

## 2013-06-30 DIAGNOSIS — D126 Benign neoplasm of colon, unspecified: Secondary | ICD-10-CM

## 2013-06-30 DIAGNOSIS — Z1211 Encounter for screening for malignant neoplasm of colon: Secondary | ICD-10-CM

## 2013-06-30 MED ORDER — SODIUM CHLORIDE 0.9 % IV SOLN: 500.0000 mL | INTRAVENOUS | Status: DC

## 2013-06-30 NOTE — Progress Notes (Addendum)
Pt was placed in a wheelchair and told to sit there until staff takes him down; but didn't wait for staff to push him down to the car.  When Sheran Spine went to push him down, the chair was empty.  She went downstairs to try to find him, but no cars were present.  I tried both his and his wife's cell phone with no answer  1152- spoke with wife; pt is home with her

## 2013-06-30 NOTE — Op Note (Signed)
[facility] [address] [place], [address]   COLONOSCOPY PROCEDURE REPORT  PATIENT: Shawn Espinoza, Shawn Espinoza  MR#: [id number] BIRTHDATE: 12-20-1941 , 71  yrs. old GENDER: Male ENDOSCOPIST: Iva Boop, MD, [facility] REFERRED XB:JYNWGN Sharen Hones, M.D. PROCEDURE DATE:  06/30/2013 PROCEDURE:   Colonoscopy with snare polypectomy ASA CLASS:   Class III INDICATIONS:Average risk patient for colorectal cancer and Last colonoscopy performed 10 years ago. MEDICATIONS: propofol (Diprivan) 300mg  IV, MAC sedation, administered by CRNA, and These medications were titrated to patient response per physician's verbal order  DESCRIPTION OF PROCEDURE:   After the risks benefits and alternatives of the procedure were thoroughly explained, informed consent was obtained.  A digital rectal exam revealed no rectal mass and A digital rectal exam revealed several skin tags.   The [id number]  endoscope was introduced through the anus and advanced to the cecum, which was identified by both the appendix and ileocecal valve. No adverse events experienced.   The quality of the prep was excellent using Suprep  The instrument was then slowly withdrawn as the colon was fully examined.      COLON FINDINGS: Two diminutive sessile polyps were found in the transverse colon and rectum.  A polypectomy was performed with a cold snare.  The resection was complete and the polyp tissue was completely retrieved.   Moderate diverticulosis was noted.   The colon mucosa was otherwise normal.   A right colon retroflexion was performed.  Retroflexed views revealed no abnormalities. The time to cecum=3 minutes 22 seconds.  Withdrawal time=12 minutes 0 seconds.  The scope was withdrawn and the procedure completed. COMPLICATIONS: There were no complications.  ENDOSCOPIC IMPRESSION: 1.   Two diminutive sessile polyps were found in the transverse colon and rectum; polypectomy was performed with a cold  snare 2.   Moderate diverticulosis was noted 3.   The colon mucosa was otherwise normal - excellent prep - second screening colonoscopy  RECOMMENDATIONS: Timing of repeat colonoscopy will be determined by pathology findings.   eSigned:  Iva Boop, MD, [employer] 06/30/2013 10:44 AM  cc: Eustaquio Boyden MD and The Patient

## 2013-06-30 NOTE — Patient Instructions (Addendum)
I found and removed 2 tiny polyps that look benign. You also have diverticulosis which is common and not usually a problem.  I will let you know pathology results and when to have another routine colonoscopy by mail.  I appreciate the opportunity to care for you. Iva Boop, MD, FACG   YOU HAD AN ENDOSCOPIC PROCEDURE TODAY AT THE [facility]: Refer to the procedure report that was given to you for any specific questions about what was found during the examination.  If the procedure report does not answer your questions, please call your gastroenterologist to clarify.  If you requested that your care partner not be given the details of your procedure findings, then the procedure report has been included in a sealed envelope for you to review at your convenience later.  YOU SHOULD EXPECT: Some feelings of bloating in the abdomen. Passage of more gas than usual.  Walking can help get rid of the air that was put into your GI tract during the procedure and reduce the bloating. If you had a lower endoscopy (such as a colonoscopy or flexible sigmoidoscopy) you may notice spotting of blood in your stool or on the toilet paper. If you underwent a bowel prep for your procedure, then you may not have a normal bowel movement for a few days.  DIET: Your first meal following the procedure should be a light meal and then it is ok to progress to your normal diet.  A half-sandwich or bowl of soup is an example of a good first meal.  Heavy or fried foods are harder to digest and may make you feel nauseous or bloated.  Likewise meals heavy in dairy and vegetables can cause extra gas to form and this can also increase the bloating.  Drink plenty of fluids but you should avoid alcoholic beverages for 24 hours.  ACTIVITY: Your care partner should take you home directly after the procedure.  You should plan to take it easy, moving slowly for the rest of the day.  You can resume normal activity the day  after the procedure however you should NOT DRIVE or use heavy machinery for 24 hours (because of the sedation medicines used during the test).    SYMPTOMS TO REPORT IMMEDIATELY: A gastroenterologist can be reached at any hour.  During normal business hours, 8:30 AM to 5:00 PM Monday through Friday, call [phone].  After hours and on weekends, please call the GI answering service at [phone] who will take a message and have the physician on call contact you.   Following lower endoscopy (colonoscopy or flexible sigmoidoscopy):  Excessive amounts of blood in the stool  Significant tenderness or worsening of abdominal pains  Swelling of the abdomen that is new, acute  Fever of 100F or higher  FOLLOW UP: If any biopsies were taken you will be contacted by phone or by letter within the next 1-3 weeks.  Call your gastroenterologist if you have not heard about the biopsies in 3 weeks.  Our staff will call the home number listed on your records the next business day following your procedure to check on you and address any questions or concerns that you may have at that time regarding the information given to you following your procedure. This is a courtesy call and so if there is no answer at the home number and we have not heard from you through the emergency physician on call, we will assume that you have returned to your  regular daily activities without incident.  SIGNATURES/CONFIDENTIALITY: You and/or your care partner have signed paperwork which will be entered into your electronic medical record.  These signatures attest to the fact that that the information above on your After Visit Summary has been reviewed and is understood.  Full responsibility of the confidentiality of this discharge information lies with you and/or your care-partner.  Await pathology results  Please read over information about polyps and diverticulosis  Please continue your normal medications

## 2013-06-30 NOTE — Progress Notes (Signed)
Called to room to assist during endoscopic procedure.  Patient ID and intended procedure confirmed with present staff. Received instructions for my participation in the procedure from the performing physician.  

## 2013-06-30 NOTE — Progress Notes (Signed)
Patient did not experience any of the following events: a burn prior to discharge; a fall within the facility; wrong site/side/patient/procedure/implant event; or a hospital transfer or hospital admission upon discharge from the facility. (G8907) Patient did not have preoperative order for IV antibiotic SSI prophylaxis. (G8918)  

## 2013-07-01 ENCOUNTER — Telehealth: Payer: Self-pay | Admitting: *Deleted

## 2013-07-01 NOTE — Telephone Encounter (Signed)
  Follow up Call-  Call back number 06/30/2013  Post procedure Call Back phone  # [phone]  Permission to leave phone message Yes     Patient questions:  Do you have a fever, pain , or abdominal swelling? no Pain Score  0 *  Have you tolerated food without any problems? yes  Have you been able to return to your normal activities? yes  Do you have any questions about your discharge instructions: Diet   no Medications  no Follow up visit  no  Do you have questions or concerns about your Care? no  Actions: * If pain score is 4 or above: No action needed, pain <4.

## 2013-07-06 ENCOUNTER — Encounter: Payer: Self-pay | Admitting: Internal Medicine

## 2013-07-06 DIAGNOSIS — Z860100 Personal history of colon polyps, unspecified: Secondary | ICD-10-CM

## 2013-07-06 HISTORY — DX: Personal history of colon polyps, unspecified: Z86.0100

## 2013-07-06 HISTORY — DX: Personal history of colonic polyps: Z86.010

## 2013-07-06 NOTE — Progress Notes (Signed)
Quick Note:  Diminutive adenoma and diminutive hyperplastic polyps Repeat colon about 06/2018 possibly ______

## 2013-07-07 ENCOUNTER — Encounter: Payer: Self-pay | Admitting: Family Medicine

## 2013-07-28 ENCOUNTER — Ambulatory Visit (INDEPENDENT_AMBULATORY_CARE_PROVIDER_SITE_OTHER): Payer: Medicare Other | Admitting: Ophthalmology

## 2013-08-11 ENCOUNTER — Ambulatory Visit (INDEPENDENT_AMBULATORY_CARE_PROVIDER_SITE_OTHER): Payer: Self-pay | Admitting: Ophthalmology

## 2013-08-12 ENCOUNTER — Ambulatory Visit (INDEPENDENT_AMBULATORY_CARE_PROVIDER_SITE_OTHER): Payer: Self-pay | Admitting: Ophthalmology

## 2013-08-18 ENCOUNTER — Ambulatory Visit (INDEPENDENT_AMBULATORY_CARE_PROVIDER_SITE_OTHER): Payer: Medicare Other | Admitting: Ophthalmology

## 2013-08-18 DIAGNOSIS — H35039 Hypertensive retinopathy, unspecified eye: Secondary | ICD-10-CM

## 2013-08-18 DIAGNOSIS — H3530 Unspecified macular degeneration: Secondary | ICD-10-CM

## 2013-08-18 DIAGNOSIS — I10 Essential (primary) hypertension: Secondary | ICD-10-CM

## 2013-08-18 DIAGNOSIS — E1139 Type 2 diabetes mellitus with other diabetic ophthalmic complication: Secondary | ICD-10-CM

## 2013-08-18 DIAGNOSIS — H43819 Vitreous degeneration, unspecified eye: Secondary | ICD-10-CM

## 2013-08-18 DIAGNOSIS — E11319 Type 2 diabetes mellitus with unspecified diabetic retinopathy without macular edema: Secondary | ICD-10-CM

## 2013-09-29 ENCOUNTER — Ambulatory Visit (INDEPENDENT_AMBULATORY_CARE_PROVIDER_SITE_OTHER): Payer: Medicare Other

## 2013-09-29 DIAGNOSIS — Z23 Encounter for immunization: Secondary | ICD-10-CM

## 2013-10-28 ENCOUNTER — Other Ambulatory Visit: Payer: Self-pay

## 2013-11-12 ENCOUNTER — Other Ambulatory Visit: Payer: Self-pay

## 2013-11-12 ENCOUNTER — Telehealth: Payer: Self-pay

## 2013-11-12 MED ORDER — METHOCARBAMOL 500 MG PO TABS: 500.0000 mg | ORAL_TABLET | Freq: Three times a day (TID) | ORAL | Status: DC

## 2013-11-12 MED ORDER — IBUPROFEN 800 MG PO TABS: 800.0000 mg | ORAL_TABLET | Freq: Two times a day (BID) | ORAL | Status: DC | PRN

## 2013-11-12 NOTE — Telephone Encounter (Signed)
plz notify sent in. 

## 2013-11-12 NOTE — Telephone Encounter (Signed)
Shawn Espinoza left v/m requesting refills ibuprofen and methocarbamol to [facility] [address].Please advise.

## 2013-12-15 NOTE — Telephone Encounter (Signed)
Opened in error see refill same date.

## 2013-12-23 DIAGNOSIS — H3530 Unspecified macular degeneration: Secondary | ICD-10-CM

## 2013-12-23 DIAGNOSIS — H35033 Hypertensive retinopathy, bilateral: Secondary | ICD-10-CM

## 2013-12-23 DIAGNOSIS — J910 Malignant pleural effusion: Secondary | ICD-10-CM

## 2013-12-23 HISTORY — DX: Unspecified macular degeneration: H35.30

## 2013-12-23 HISTORY — DX: Malignant pleural effusion: J91.0

## 2013-12-23 HISTORY — DX: Hypertensive retinopathy, bilateral: H35.033

## 2014-01-12 ENCOUNTER — Other Ambulatory Visit: Payer: Self-pay

## 2014-01-12 MED ORDER — GLUCOSE BLOOD VI STRP: ORAL_STRIP | Status: DC

## 2014-01-13 ENCOUNTER — Other Ambulatory Visit: Payer: Self-pay | Admitting: Family Medicine

## 2014-01-18 ENCOUNTER — Encounter: Payer: Self-pay | Admitting: Family Medicine

## 2014-01-18 ENCOUNTER — Ambulatory Visit (INDEPENDENT_AMBULATORY_CARE_PROVIDER_SITE_OTHER): Payer: Medicare Other | Admitting: Family Medicine

## 2014-01-18 VITALS — BP 170/88 | HR 72 | Temp 97.9°F | Wt 182.5 lb

## 2014-01-18 DIAGNOSIS — S99919A Unspecified injury of unspecified ankle, initial encounter: Secondary | ICD-10-CM

## 2014-01-18 DIAGNOSIS — S99929A Unspecified injury of unspecified foot, initial encounter: Secondary | ICD-10-CM

## 2014-01-18 DIAGNOSIS — I10 Essential (primary) hypertension: Secondary | ICD-10-CM

## 2014-01-18 DIAGNOSIS — S8990XA Unspecified injury of unspecified lower leg, initial encounter: Secondary | ICD-10-CM

## 2014-01-18 MED ORDER — GLUCOSE BLOOD VI STRP: ORAL_STRIP | Status: DC

## 2014-01-18 MED ORDER — ONETOUCH DELICA LANCETS 33G MISC: Status: DC

## 2014-01-18 NOTE — Progress Notes (Signed)
   Subjective:    Patient ID: Shawn Espinoza, male    DOB: [birth year], 73 y.o.   MRN: [id number]  HPI CC: L toenail issue  L great toe pain - several months ago noticed white crust in toe, scraped off and started bleeding lateral nail bed and using methiolate, athlete's foot spray.  Wanted to ensure no infection.    HTN - bp elevated today.  Stays lightheaded/dizzy especially with any positionchanges.  Self stopped lisinopril 40mg  and body cramps improved.  Taking amlodipine 10mg  and atenolol 50mg  daily.  Does check blood pressures at home: running 110-145/70-85.  No low blood pressure readings.  Denies HA, CP/tightness, SOB, leg swelling.  Stays active, works out, plays golf.  Desires to come off meds completely as he feels they cause muscle aches and fatigue and dizziness.  Off aspirin - due to easy bleeding. BP Readings from Last 3 Encounters:  01/18/14 160/84  06/30/13 132/73  03/10/13 158/78     Past Medical History  Diagnosis Date  . History of diabetes mellitus, type II     resolved with diet, h/o neuropathy  . Diverticulosis of colon   . Hyperlipidemia   . Hypertension   . Fatty liver 02/29/00    abd ultrasound  . Lower back pain   . Systolic murmur     echo 8676 - normal LV fxn, EF 55%, mild AS, biatrial enlargement  . History of tobacco use quit 1990s  . 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 Personal history of colonic adenomas 07/06/2013     Review of Systems Per HPI    Objective:   Physical Exam  Nursing note and vitals reviewed. Constitutional: He appears well-developed and well-nourished. No distress.  Musculoskeletal: He exhibits no edema.  Left lateral toenail bed with abrasion and scabbing but no significant erythema or discomfort. 2+ DP bilaterally       Assessment & Plan:

## 2014-01-18 NOTE — Patient Instructions (Addendum)
Let's change atenolol to 1/2 tablet twice a day. Decrease amlodipine to 5mg  once daily. Keep a very close eye on blood pressures over next few weeks and let me know if bp creeping up past 140/90 consistently. For toenail - use triple antibiotic ointment for next few days and watch for redness or more pain.

## 2014-01-18 NOTE — Assessment & Plan Note (Signed)
Anticipate abrasion after overzealous scraping of toenail - likely onychomycosis. Recommended triple abx ointment for next few days then restart athelete's foot spray. Update if sxs recurring.

## 2014-01-18 NOTE — Progress Notes (Signed)
Pre-visit discussion using our clinic review tool. No additional management support is needed unless otherwise documented below in the visit note.  

## 2014-01-18 NOTE — Assessment & Plan Note (Signed)
Endorses some hypotensive symptoms altough bp in office elevated today. Endorses good control at home however. I've asked him to bring in bp cuff to next visit. We will do trial of amlodipine 5mg  and atenolol 25mg  bid. I advised pt needs to notify me right away if bp running elevated at home.

## 2014-01-19 ENCOUNTER — Telehealth: Payer: Self-pay | Admitting: Family Medicine

## 2014-01-19 ENCOUNTER — Other Ambulatory Visit: Payer: Self-pay | Admitting: *Deleted

## 2014-01-19 MED ORDER — GLUCOSE BLOOD VI STRP: ORAL_STRIP | Status: DC

## 2014-01-19 MED ORDER — ONETOUCH DELICA LANCETS 33G MISC: Status: DC

## 2014-01-19 NOTE — Telephone Encounter (Signed)
Relevant patient education assigned to patient using Emmi. ° °

## 2014-01-25 ENCOUNTER — Telehealth: Payer: Self-pay | Admitting: *Deleted

## 2014-01-25 NOTE — Telephone Encounter (Signed)
Patient's wife left a voicemail stating that they got a message from [facility] stating that patient needs to see a podiatrist since he has not seen one since 2013. Perrin Smack stated that they did not know that he needed to do this. Please advise. Perrin Smack stated that they received the wrong script for lancets and the correct one is one touch ultra soft and it needs to go to [facility].

## 2014-01-25 NOTE — Telephone Encounter (Signed)
Lab Results  Component Value Date   HGBA1C 5.4 03/03/2013  plz notify - the podiatrist recommendation was an automatic recommendation for diabetics from [facility].  He does not need podiatrist eval - his diabetes has resolved with better diet choices.  I have updated his chart and he should not continue to receive this recommendation.  Let us know if he does. plz send correct lancets to [facility] and notify pt.

## 2014-01-26 MED ORDER — ONETOUCH ULTRASOFT LANCETS MISC: Status: DC

## 2014-01-26 NOTE — Telephone Encounter (Signed)
Message left notifying patient. Correct lancets sent in.

## 2014-02-20 DIAGNOSIS — C3492 Malignant neoplasm of unspecified part of left bronchus or lung: Secondary | ICD-10-CM

## 2014-02-20 HISTORY — DX: Malignant neoplasm of unspecified part of left bronchus or lung: C34.92

## 2014-02-20 HISTORY — PX: FLEXIBLE BRONCHOSCOPY: SUR164

## 2014-02-22 ENCOUNTER — Encounter: Payer: Self-pay | Admitting: Family Medicine

## 2014-02-22 ENCOUNTER — Ambulatory Visit (INDEPENDENT_AMBULATORY_CARE_PROVIDER_SITE_OTHER): Payer: Medicare Other | Admitting: Family Medicine

## 2014-02-22 ENCOUNTER — Other Ambulatory Visit: Payer: Self-pay | Admitting: Family Medicine

## 2014-02-22 ENCOUNTER — Ambulatory Visit (INDEPENDENT_AMBULATORY_CARE_PROVIDER_SITE_OTHER)
Admission: RE | Admit: 2014-02-22 | Discharge: 2014-02-22 | Disposition: A | Discharge status: Home / Self Care | Payer: Medicare Other | Source: Ambulatory Visit | Attending: Family Medicine | Admitting: Family Medicine

## 2014-02-22 VITALS — BP 145/85 | HR 59 | Temp 98.0°F | Wt 181.0 lb

## 2014-02-22 DIAGNOSIS — I10 Essential (primary) hypertension: Secondary | ICD-10-CM

## 2014-02-22 DIAGNOSIS — R05 Cough: Secondary | ICD-10-CM

## 2014-02-22 DIAGNOSIS — R0789 Other chest pain: Secondary | ICD-10-CM

## 2014-02-22 DIAGNOSIS — R0609 Other forms of dyspnea: Secondary | ICD-10-CM

## 2014-02-22 DIAGNOSIS — R059 Cough, unspecified: Secondary | ICD-10-CM

## 2014-02-22 DIAGNOSIS — R9389 Abnormal findings on diagnostic imaging of other specified body structures: Secondary | ICD-10-CM

## 2014-02-22 DIAGNOSIS — R0989 Other specified symptoms and signs involving the circulatory and respiratory systems: Secondary | ICD-10-CM

## 2014-02-22 LAB — CBC WITH DIFFERENTIAL/PLATELET
Basophils Absolute: 0 10*3/uL (ref 0.0–0.1)
Basophils Relative: 0.5 % (ref 0.0–3.0)
EOS PCT: 4.7 % (ref 0.0–5.0)
Eosinophils Absolute: 0.3 10*3/uL (ref 0.0–0.7)
HCT: 52.4 % — ABNORMAL HIGH (ref 39.0–52.0)
Hemoglobin: 17.4 g/dL — ABNORMAL HIGH (ref 13.0–17.0)
LYMPHS PCT: 15.6 % (ref 12.0–46.0)
Lymphs Abs: 1 10*3/uL (ref 0.7–4.0)
MCHC: 33.3 g/dL (ref 30.0–36.0)
MCV: 96.1 fl (ref 78.0–100.0)
MONOS PCT: 7.9 % (ref 3.0–12.0)
Monocytes Absolute: 0.5 10*3/uL (ref 0.1–1.0)
NEUTROS PCT: 71.3 % (ref 43.0–77.0)
Neutro Abs: 4.7 10*3/uL (ref 1.4–7.7)
PLATELETS: 172 10*3/uL (ref 150.0–400.0)
RBC: 5.45 Mil/uL (ref 4.22–5.81)
RDW: 13.1 % (ref 11.5–14.6)
WBC: 6.6 10*3/uL (ref 4.5–10.5)

## 2014-02-22 LAB — BRAIN NATRIURETIC PEPTIDE: Pro B Natriuretic peptide (BNP): 61 pg/mL (ref 0.0–100.0)

## 2014-02-22 MED ORDER — AZITHROMYCIN 250 MG PO TABS: ORAL_TABLET | ORAL | Status: DC

## 2014-02-22 MED ORDER — AMLODIPINE BESYLATE 5 MG PO TABS: 5.0000 mg | ORAL_TABLET | Freq: Every day | ORAL | Status: DC

## 2014-02-22 MED ORDER — AMOXICILLIN-POT CLAVULANATE 875-125 MG PO TABS: 1.0000 | ORAL_TABLET | Freq: Two times a day (BID) | ORAL | Status: DC

## 2014-02-22 MED ORDER — ATENOLOL 25 MG PO TABS: 25.0000 mg | ORAL_TABLET | Freq: Two times a day (BID) | ORAL | Status: DC

## 2014-02-22 NOTE — Assessment & Plan Note (Signed)
Ongoing productive cough for last several months, worse over last few weeks.  Check CBC today to eval for PNA. Check CXR today - diffuse L sided infiltrates, ?LLL PNA.  Will await radiologist read.   Treat as CAP for now with augmentin and zpack.

## 2014-02-22 NOTE — Progress Notes (Signed)
BP 145/85  Pulse 59  Temp(Src) 98 F (36.7 C) (Oral)  Wt 181 lb (82.101 kg)  SpO2 96%   CC: cough, check toe  Subjective:    Patient ID: Shawn Espinoza, male    DOB: January 10, 1941, 73 y.o.   MRN: [id number]  HPI: KWADWO TARAS is a 73 y.o. male presenting on 02/22/2014 with Cough and Check toe   HTN - doing better with atenolol 25mg  bid and amlodipine 5mg  daily.  Brings home BP cuff which is pretty similar to our readings in office.  Cough - ongoing over last few months.  Has been taking mucinex - didn't help.  Last week cough was associated with dyspnea and chest tightness esp worse with exertion.  Coughing up yellow phlegm.  This week feeling some better.  Cough worse at night time, wakes him up.  Sleeping with humidifier and steam room.  No fevers/chills.  No more dyspnea with exertion.  Worse pain with laying on left side. Pt denies leg swelling. Remote ex smoker.  No fmhx lung cancer. Wt Readings from Last 3 Encounters:  02/22/14 181 lb (82.101 kg)  01/18/14 182 lb 8 oz (82.781 kg)  06/30/13 184 lb (83.462 kg)    Check left great toe and 2nd toe - not itchy or tender.  Relevant past medical, surgical, family and social history reviewed and updated as indicated.  Allergies and medications reviewed and updated. Current Outpatient Prescriptions on File Prior to Visit  Medication Sig  . Digestive Enzymes (PAPAYA AND ENZYMES PO) Take by mouth as needed.    Marland Kitchen glucose blood (ONE TOUCH ULTRA TEST) test strip USE ONE STRIP TO CHECK GLUCOSE ONCE DAILY IN THE MORNING 4 TO 5 DAYS A WEEK Dx:250.00  . ibuprofen (ADVIL,MOTRIN) 800 MG tablet Take 1 tablet (800 mg total) by mouth 2 (two) times daily as needed.  . L-Glutamine 500 MG TABS Take 1,000 mg by mouth daily.  . Lancets (ONETOUCH ULTRASOFT) lancets Use to check sugar 4-5 times weekly. Dx: 250.00  . methocarbamol (ROBAXIN) 500 MG tablet Take 1 tablet (500 mg total) by mouth 3 (three) times daily.  . Milk Thistle 200 MG CAPS  Take 1 capsule by mouth 2 (two) times daily.  . Multiple Vitamin (MULTIVITAMIN) tablet Take 1 tablet by mouth daily.    . Multiple Vitamins-Minerals (EYE VITAMINS PO) 2 (two) times daily. Due to macular degeneration  . NON FORMULARY Compounded cream-idctg use as directed.  . Polyethylene Glycol 3350 (MIRALAX PO) Take by mouth as directed. Takes 1/2 dose PRN  . vitamin C (ASCORBIC ACID) 500 MG tablet Take 250 mg by mouth daily.   Marland Kitchen aspirin 81 MG EC tablet Take 81 mg by mouth daily.    . Magnesium 250 MG TABS Take 250 mg by mouth daily.   No current facility-administered medications on file prior to visit.    Review of Systems Per HPI unless specifically indicated above    Objective:    BP 145/85  Pulse 59  Temp(Src) 98 F (36.7 C) (Oral)  Wt 181 lb (82.101 kg)  SpO2 96%  Physical Exam  Nursing note and vitals reviewed. Constitutional: He appears well-developed and well-nourished. No distress.  HENT:  Head: Normocephalic and atraumatic.  Right Ear: Hearing, tympanic membrane, external ear and ear canal normal.  Left Ear: Hearing, tympanic membrane, external ear and ear canal normal.  Nose: Nose normal. No mucosal edema or rhinorrhea. Right sinus exhibits no maxillary sinus tenderness and no frontal sinus tenderness.  Left sinus exhibits no maxillary sinus tenderness and no frontal sinus tenderness.  Mouth/Throat: Uvula is midline, oropharynx is clear and moist and mucous membranes are normal. No oropharyngeal exudate, posterior oropharyngeal edema, posterior oropharyngeal erythema or tonsillar abscesses.  Eyes: Conjunctivae and EOM are normal. Pupils are equal, round, and reactive to light. No scleral icterus.  Neck: Normal range of motion. Neck supple. No hepatojugular reflux and no JVD present.  Cardiovascular: Normal rate, regular rhythm and intact distal pulses.   Murmur (3/6 SEM best at apex) heard. Pulmonary/Chest: Effort normal. No respiratory distress. He has decreased breath  sounds. He has no wheezes. He has rhonchi (central). He has rales (left).  Musculoskeletal: He exhibits no edema.  Left 2nd toe with mild erythematous patch  Lymphadenopathy:    He has no cervical adenopathy.  Skin: Skin is warm and dry. No rash noted.       Assessment & Plan:   Problem List Items Addressed This Visit   Cough - Primary     Ongoing productive cough for last several months, worse over last few weeks.  Check CBC today to eval for PNA. Check CXR today - diffuse L sided infiltrates, ?LLL PNA.  Will await radiologist read.   Treat as CAP for now with augmentin and zpack.    Relevant Orders      DG Chest 2 View (Completed)      CBC with Differential   DOE (dyspnea on exertion)     With some chest discomfort - check EKG today as well as BNP/TnI.  If abnormal, would consider updating echocardiogram (last 2010, mild AS).  Murmur today more consistent with MR. Anticipate DOE more related to lung findings today. EKG - NSR rate 70, normal axis, intervals, no acute ST/T changes, good R wave progression    Relevant Orders      Brain natriuretic peptide      CBC with Differential   HYPERTENSION     bp has stabilized - continue current regimen without change.    Relevant Medications      amLODIpine (NORVASC) tablet      atenolol (TENORMIN) tablet    Other Visit Diagnoses   Chest discomfort        Relevant Orders       EKG 12-Lead (Completed)       Troponin I        Follow up plan: Return if symptoms worsen or fail to improve.

## 2014-02-22 NOTE — Patient Instructions (Addendum)
No changes to blood pressure medicines today. Xray today - some fluid in left lung - I'd like to get EKG today and blood work. Will treat you as lung infection with 7 day course of antibiotic (augmentin penicillin sent to pharmacy). Depending on blood work results, we may obtain ultrasound of the heart or refer you to heart doctor.  We will call you with results.

## 2014-02-22 NOTE — Progress Notes (Signed)
Pre visit review using our clinic review tool, if applicable. No additional management support is needed unless otherwise documented below in the visit note. 

## 2014-02-22 NOTE — Assessment & Plan Note (Addendum)
bp has stabilized - continue current regimen without change.

## 2014-02-22 NOTE — Assessment & Plan Note (Signed)
With some chest discomfort - check EKG today as well as BNP/TnI.  If abnormal, would consider updating echocardiogram (last 2010, mild AS).  Murmur today more consistent with MR. Anticipate DOE more related to lung findings today.  EKG - NSR rate 70, normal axis, intervals, no acute ST/T changes, good R wave progression

## 2014-02-23 ENCOUNTER — Telehealth: Payer: Self-pay | Admitting: Family Medicine

## 2014-02-23 ENCOUNTER — Ambulatory Visit (INDEPENDENT_AMBULATORY_CARE_PROVIDER_SITE_OTHER)
Admission: RE | Admit: 2014-02-23 | Discharge: 2014-02-23 | Disposition: A | Discharge status: Home / Self Care | Payer: Medicare Other | Source: Ambulatory Visit | Attending: Family Medicine | Admitting: Family Medicine

## 2014-02-23 ENCOUNTER — Other Ambulatory Visit: Payer: Self-pay | Admitting: Family Medicine

## 2014-02-23 DIAGNOSIS — R918 Other nonspecific abnormal finding of lung field: Secondary | ICD-10-CM

## 2014-02-23 DIAGNOSIS — R9389 Abnormal findings on diagnostic imaging of other specified body structures: Secondary | ICD-10-CM

## 2014-02-23 DIAGNOSIS — R059 Cough, unspecified: Secondary | ICD-10-CM

## 2014-02-23 DIAGNOSIS — R0609 Other forms of dyspnea: Secondary | ICD-10-CM

## 2014-02-23 DIAGNOSIS — R05 Cough: Secondary | ICD-10-CM

## 2014-02-23 LAB — TROPONIN I: Troponin I: 0.01 ng/mL (ref <0.06)

## 2014-02-23 LAB — CREATININE, SERUM: CREATININE: 0.83 mg/dL (ref 0.50–1.35)

## 2014-02-23 MED ORDER — IOHEXOL 300 MG/ML  SOLN
80.0000 mL | Freq: Once | INTRAMUSCULAR | Status: AC | PRN
  Administered 2014-02-23: 80 mL via INTRAVENOUS

## 2014-02-23 NOTE — Telephone Encounter (Signed)
Relevant patient education mailed to patient.  

## 2014-02-24 ENCOUNTER — Encounter: Payer: Self-pay | Admitting: Family Medicine

## 2014-02-25 ENCOUNTER — Ambulatory Visit (INDEPENDENT_AMBULATORY_CARE_PROVIDER_SITE_OTHER): Payer: Medicare Other

## 2014-02-25 DIAGNOSIS — Z111 Encounter for screening for respiratory tuberculosis: Secondary | ICD-10-CM

## 2014-02-28 ENCOUNTER — Other Ambulatory Visit: Payer: Self-pay | Admitting: *Deleted

## 2014-02-28 ENCOUNTER — Telehealth: Payer: Self-pay

## 2014-02-28 LAB — TB SKIN TEST: TB Skin Test: NEGATIVE

## 2014-02-28 MED ORDER — AMLODIPINE BESYLATE 5 MG PO TABS: 5.0000 mg | ORAL_TABLET | Freq: Every day | ORAL | Status: DC

## 2014-02-28 NOTE — Telephone Encounter (Signed)
I'm glad PPD was negative. How is cough doing on 2 antibiotics prescribed? Let's await evaluation by Dr. Melvyn Novas on Wednesday.

## 2014-02-28 NOTE — Telephone Encounter (Signed)
Pt had PPD read today which reading was negative.Please advise.

## 2014-03-01 NOTE — Telephone Encounter (Signed)
Message left for patient to return my call.  

## 2014-03-02 ENCOUNTER — Encounter: Payer: Self-pay | Admitting: Internal Medicine

## 2014-03-02 ENCOUNTER — Ambulatory Visit (INDEPENDENT_AMBULATORY_CARE_PROVIDER_SITE_OTHER): Payer: Medicare Other | Admitting: Internal Medicine

## 2014-03-02 VITALS — BP 160/80 | HR 55 | Temp 98.0°F | Ht 69.0 in | Wt 179.6 lb

## 2014-03-02 DIAGNOSIS — R9389 Abnormal findings on diagnostic imaging of other specified body structures: Secondary | ICD-10-CM

## 2014-03-02 NOTE — Progress Notes (Signed)
Subjective:    Patient ID: Shawn Espinoza, male    DOB: 07-27-1941, 73 y.o.   MRN: [id number]  HPI  17 yowm quit smoking at birth of granddaughter in 45 new onset recurrent bronchitis x 2000 referred to pulmonary clinic 03/02/2014 by Dr Danise Mina for sob and abd CT chest  03/02/2014 1st [facility] office visit/ Pennye Beeghly  Chief Complaint  Patient presents with  . Pulmonary Consult    Referred per Dr. Leo Grosser. Pt c/o SOB and cough for the past 5 months- worse for the past month. He states gets SOB with minimal exertion such as swinging a golf club.  Cough is prod with moderate white to yellow sputum.   last week of Feb 2015  abrupt onset L pleuritic CP  , much worse  cough yellow mucus, more sob than usual  feeling > 75 % better p zmax and augmentin started on  02/22/14  -    hasn't finished augmentin yet  and now can take deep breath s hurting where could not previously and also do eliptical x 20 min on day of ov   No obvious other patterns in day to day or daytime variabilty   chest tightness, subjective wheeze overt sinus or hb symptoms. No unusual exp hx or h/o childhood pna/ asthma or knowledge of premature birth.  Sleeping ok without nocturnal  or early am exacerbation  of respiratory  c/o's or need for noct saba. Also denies any obvious fluctuation of symptoms with weather or environmental changes or other aggravating or alleviating factors except as outlined above   Current Medications, Allergies, Complete Past Medical History, Past Surgical History, Family History, and Social History were reviewed in [employer] record.              Review of Systems  Constitutional: Negative for fever, chills, activity change, appetite change and unexpected weight change.  HENT: Negative for congestion, dental problem, postnasal drip, rhinorrhea, sneezing, sore throat, trouble swallowing and voice change.   Eyes: Negative for visual disturbance.  Respiratory:  Positive for cough and shortness of breath. Negative for choking.   Cardiovascular: Negative for chest pain and leg swelling.  Gastrointestinal: Negative for nausea, vomiting and abdominal pain.  Genitourinary: Negative for difficulty urinating.  Musculoskeletal: Positive for arthralgias.  Skin: Negative for rash.  Psychiatric/Behavioral: Negative for behavioral problems and confusion.       Objective:   Physical Exam   Wt Readings from Last 3 Encounters:  03/02/14 179 lb 9.6 oz (81.466 kg)  02/22/14 181 lb (82.101 kg)  01/18/14 182 lb 8 oz (82.781 kg)      HEENT: nl dentition, turbinates, and orophanx. Nl external ear canals without cough reflex   NECK :  without JVD/Nodes/TM/ nl carotid upstrokes bilaterally   LUNGS: no acc muscle use, insp and exp rhonchi localized to L lung ant > post   CV:  RRR  no s3 or  increase in P2, no edema -  III/VI sem  ABD:  soft and nontender with nl excursion in the supine position. No bruits or organomegaly, bowel sounds nl  MS:  warm without deformities, calf tenderness, cyanosis or clubbing  SKIN: warm and dry without lesions    NEURO:  alert, approp, no deficits    CT chest  02/23/14 Abnormal findings in the left lung as described above including  patchy ground-glass attenuation, thickened interstitium, left upper  and left lower lobe pulmonary nodules and small to moderate left  pleural  effusion. Central airway thickening. Partially calcified  left hilar and mediastinal lymph nodes/adenopathy. Constellation of  findings is nonspecific. This certainly could be infectious.  Atypical organisms should be considered including tuberculosis.  However, I cannot exclude neoplastic process. Of particular concern  is the left lower lobe nodule causing retraction of the pleural  laterally. Also of concern is the enhancing 4.6 cm lesion within the  liver. Consider pulmonology consultation.  IPPD 02/28/14 neg        Assessment & Plan:

## 2014-03-02 NOTE — Patient Instructions (Signed)
Finish all your antibiotics  Please schedule a follow up office visit in 2 weeks, sooner if needed with cxr on return

## 2014-03-02 NOTE — Telephone Encounter (Signed)
Spoke with patient. He said cough and pain is much better. He saw Dr. Melvyn Novas today and he said that he is to go back in 2 weeks for a follow up. Dr. Melvyn Novas said he came in "too early" because the abx haven't even had a chance to work completely which is why he wanted him to come back. He will do a repeat CXR at that time. He said he wasn't concerned about the CT results and said they were normal with PNA.

## 2014-03-03 DIAGNOSIS — R9389 Abnormal findings on diagnostic imaging of other specified body structures: Secondary | ICD-10-CM | POA: Insufficient documentation

## 2014-03-03 NOTE — Assessment & Plan Note (Signed)
Clinical course is worrisome for lung ca as he had chronic cough before the abrupt illness described above but he's feeling > 75% improved p course of abx suggesting superimposed L infectious process which is resolving but will cause a false pos PET.  Discussed in detail all the  indications, usual  risks and alternatives  relative to the benefits with patient who agrees to proceed with conservative f/u with cxr then consider pet vs tap the L effusion if not resolving typical of an uncomplicated parapneumonic process.

## 2014-03-06 ENCOUNTER — Other Ambulatory Visit: Payer: Self-pay | Admitting: Family Medicine

## 2014-03-06 DIAGNOSIS — R768 Other specified abnormal immunological findings in serum: Secondary | ICD-10-CM

## 2014-03-06 DIAGNOSIS — N400 Enlarged prostate without lower urinary tract symptoms: Secondary | ICD-10-CM

## 2014-03-06 DIAGNOSIS — E785 Hyperlipidemia, unspecified: Secondary | ICD-10-CM

## 2014-03-06 DIAGNOSIS — I10 Essential (primary) hypertension: Secondary | ICD-10-CM

## 2014-03-09 ENCOUNTER — Other Ambulatory Visit (INDEPENDENT_AMBULATORY_CARE_PROVIDER_SITE_OTHER): Payer: Medicare Other

## 2014-03-09 DIAGNOSIS — N400 Enlarged prostate without lower urinary tract symptoms: Secondary | ICD-10-CM

## 2014-03-09 DIAGNOSIS — R768 Other specified abnormal immunological findings in serum: Secondary | ICD-10-CM

## 2014-03-09 DIAGNOSIS — I10 Essential (primary) hypertension: Secondary | ICD-10-CM

## 2014-03-09 DIAGNOSIS — E785 Hyperlipidemia, unspecified: Secondary | ICD-10-CM

## 2014-03-09 DIAGNOSIS — R894 Abnormal immunological findings in specimens from other organs, systems and tissues: Secondary | ICD-10-CM

## 2014-03-09 LAB — COMPREHENSIVE METABOLIC PANEL
ALK PHOS: 55 U/L (ref 39–117)
ALT: 21 U/L (ref 0–53)
AST: 20 U/L (ref 0–37)
Albumin: 3.9 g/dL (ref 3.5–5.2)
BILIRUBIN TOTAL: 1.2 mg/dL (ref 0.3–1.2)
BUN: 15 mg/dL (ref 6–23)
CO2: 30 mEq/L (ref 19–32)
Calcium: 8.7 mg/dL (ref 8.4–10.5)
Chloride: 104 mEq/L (ref 96–112)
Creatinine, Ser: 0.8 mg/dL (ref 0.4–1.5)
GFR: 95.12 mL/min (ref >60.00)
Glucose, Bld: 138 mg/dL — ABNORMAL HIGH (ref 70–99)
POTASSIUM: 4.5 meq/L (ref 3.5–5.1)
Sodium: 141 mEq/L (ref 135–145)
Total Protein: 6 g/dL (ref 6.0–8.3)

## 2014-03-09 LAB — LIPID PANEL
Cholesterol: 206 mg/dL — ABNORMAL HIGH (ref 0–200)
HDL: 52.3 mg/dL (ref >39.00)
LDL CALC: 143 mg/dL — AB (ref 0–99)
TRIGLYCERIDES: 54 mg/dL (ref 0.0–149.0)
Total CHOL/HDL Ratio: 4
VLDL: 10.8 mg/dL (ref 0.0–40.0)

## 2014-03-09 LAB — PSA: PSA: 1.2 ng/mL (ref 0.10–4.00)

## 2014-03-16 ENCOUNTER — Ambulatory Visit (INDEPENDENT_AMBULATORY_CARE_PROVIDER_SITE_OTHER): Payer: Medicare Other | Admitting: Internal Medicine

## 2014-03-16 ENCOUNTER — Encounter: Payer: Self-pay | Admitting: Family Medicine

## 2014-03-16 ENCOUNTER — Encounter: Payer: Self-pay | Admitting: Internal Medicine

## 2014-03-16 ENCOUNTER — Ambulatory Visit (INDEPENDENT_AMBULATORY_CARE_PROVIDER_SITE_OTHER): Payer: Medicare Other | Admitting: Family Medicine

## 2014-03-16 VITALS — BP 140/70 | HR 64 | Temp 98.1°F | Ht 69.0 in | Wt 177.0 lb

## 2014-03-16 VITALS — BP 148/78 | HR 57 | Temp 98.0°F | Ht 67.5 in | Wt 177.0 lb

## 2014-03-16 DIAGNOSIS — Z8639 Personal history of other endocrine, nutritional and metabolic disease: Secondary | ICD-10-CM

## 2014-03-16 DIAGNOSIS — Z0000 Encounter for general adult medical examination without abnormal findings: Secondary | ICD-10-CM

## 2014-03-16 DIAGNOSIS — I10 Essential (primary) hypertension: Secondary | ICD-10-CM

## 2014-03-16 DIAGNOSIS — Z862 Personal history of diseases of the blood and blood-forming organs and certain disorders involving the immune mechanism: Secondary | ICD-10-CM

## 2014-03-16 DIAGNOSIS — R9389 Abnormal findings on diagnostic imaging of other specified body structures: Secondary | ICD-10-CM

## 2014-03-16 DIAGNOSIS — R0609 Other forms of dyspnea: Secondary | ICD-10-CM

## 2014-03-16 DIAGNOSIS — R0989 Other specified symptoms and signs involving the circulatory and respiratory systems: Secondary | ICD-10-CM

## 2014-03-16 DIAGNOSIS — E785 Hyperlipidemia, unspecified: Secondary | ICD-10-CM

## 2014-03-16 DIAGNOSIS — R011 Cardiac murmur, unspecified: Secondary | ICD-10-CM

## 2014-03-16 NOTE — Progress Notes (Signed)
BP 148/78  Pulse 57  Temp(Src) 98 F (36.7 C) (Oral)  Ht 5' 7.5" (1.715 m)  Wt 177 lb (80.287 kg)  BMI 27.30 kg/m2  SpO2 96%   CC: Medicare wellness visit Subjective:    Patient ID: Shawn Espinoza, male    DOB: 03/20/1941, 73 y.o.   MRN: [id number]  HPI: Shawn Espinoza is a 73 y.o. male presenting on 03/16/2014 for medicare wellness visit   See recent notes for details - briefly seen here with chronic cough with abnormal CXR and CT scan referred to pulm thought most consistent with pneumonia s/p treatment with augmentin and azithromycin and feeling better although could not rule out malignancy.  F/u scheduled with pulm next week, then reassess with CXR and possible tap L pleural effusion if remaining.  Noticing persistent night sweats fatigue and dyspnea.  Cough significantly improved.  Hearing screen passed today.  Vision screen at Dr. Payton Emerald office (scheduled for 03/2014) Denies falls, depression/anhedonia, sadness  Preventative: Colonoscopy - 2014 tubular adenoma x1, mod diverticulosis, rec f/u 5 yrs Carlean Purl) Prostate - checked yearly, no concerns. PSA 02/2014 WNL.  Flu shot - 09/2013 Hep A and B shots done 2001 Pneumovax 10/2006 - due for f/u prevnar once feeling better Td 2006 zostavax - 08/2012 Advanced directives: living will discussed. Full code. Wouldn't want prolonged artificial life support. Wife would be HCproxy  Lives with wife. [employer], failed eye exam, quit Activity: active at gym regularly, golfing Diet: healthy - oatmeal, avoids white starches  Relevant past medical, surgical, family and social history reviewed and updated as indicated.  Allergies and medications reviewed and updated. Current Outpatient Prescriptions on File Prior to Visit  Medication Sig  . amLODipine (NORVASC) 5 MG tablet Take 1 tablet (5 mg total) by mouth daily.  Marland Kitchen atenolol (TENORMIN) 25 MG tablet Take 1 tablet (25 mg total) by mouth 2 (two) times daily.  . Digestive Enzymes  (PAPAYA AND ENZYMES PO) Take by mouth as needed.    Marland Kitchen glucose blood (ONE TOUCH ULTRA TEST) test strip USE ONE STRIP TO CHECK GLUCOSE ONCE DAILY IN THE MORNING 4 TO 5 DAYS A WEEK Dx:250.00  . ibuprofen (ADVIL,MOTRIN) 800 MG tablet Take 1 tablet (800 mg total) by mouth 2 (two) times daily as needed.  . Lancets (ONETOUCH ULTRASOFT) lancets Use to check sugar 4-5 times weekly. Dx: 250.00  . methocarbamol (ROBAXIN) 500 MG tablet Take 1 tablet (500 mg total) by mouth 3 (three) times daily.  . Milk Thistle 200 MG CAPS Take 1 capsule by mouth 2 (two) times daily.  . Multiple Vitamin (MULTIVITAMIN) tablet Take 1 tablet by mouth daily.    . Multiple Vitamins-Minerals (EYE VITAMINS PO) 2 (two) times daily. Due to macular degeneration  . Polyethylene Glycol 3350 (MIRALAX PO) Take by mouth as directed. Takes 1/2 dose PRN  . vitamin C (ASCORBIC ACID) 500 MG tablet Take 250 mg by mouth daily.   Marland Kitchen aspirin 81 MG EC tablet Take 81 mg by mouth daily.    . L-Glutamine 500 MG TABS Take 1,000 mg by mouth daily.  . Magnesium 250 MG TABS Take 250 mg by mouth daily.  . NON FORMULARY Compounded cream-idctg use as directed.   No current facility-administered medications on file prior to visit.    Review of Systems  Constitutional: Positive for chills. Negative for fever, activity change, appetite change, fatigue and unexpected weight change.       Night sweats  HENT: Negative for hearing loss.  Eyes: Positive for visual disturbance (may be worsening, followed by ophtho).  Respiratory: Positive for cough, shortness of breath and wheezing. Negative for chest tightness.   Cardiovascular: Negative for chest pain, palpitations and leg swelling.  Gastrointestinal: Negative for nausea, vomiting, abdominal pain, diarrhea, constipation, blood in stool and abdominal distention.  Genitourinary: Negative for hematuria and difficulty urinating.  Musculoskeletal: Negative for arthralgias, myalgias and neck pain.  Skin: Negative  for rash.  Neurological: Positive for dizziness. Negative for seizures, syncope and headaches.  Hematological: Negative for adenopathy. Does not bruise/bleed easily.  Psychiatric/Behavioral: Negative for dysphoric mood. The patient is not nervous/anxious.    Per HPI unless specifically indicated above    Objective:    BP 148/78  Pulse 57  Temp(Src) 98 F (36.7 C) (Oral)  Ht 5' 7.5" (1.715 m)  Wt 177 lb (80.287 kg)  BMI 27.30 kg/m2  SpO2 96%  Physical Exam  Nursing note and vitals reviewed. Constitutional: He is oriented to person, place, and time. He appears well-developed and well-nourished. No distress.  HENT:  Head: Normocephalic and atraumatic.  Right Ear: Hearing, tympanic membrane, external ear and ear canal normal.  Left Ear: Hearing, tympanic membrane, external ear and ear canal normal.  Nose: Nose normal.  Mouth/Throat: Uvula is midline, oropharynx is clear and moist and mucous membranes are normal. No oropharyngeal exudate, posterior oropharyngeal edema or posterior oropharyngeal erythema.  Eyes: Conjunctivae and EOM are normal. Pupils are equal, round, and reactive to light. No scleral icterus.  Neck: Normal range of motion. Neck supple. Carotid bruit is not present. No thyromegaly present.  Cardiovascular: Normal rate, regular rhythm and intact distal pulses.   Murmur (4/6 SEM best at apex) heard. Pulses:      Radial pulses are 2+ on the right side, and 2+ on the left side.  Pulmonary/Chest: Effort normal and breath sounds normal. No respiratory distress. He has no wheezes. He has no rales.  Abdominal: Soft. Bowel sounds are normal. He exhibits no distension and no mass. There is no tenderness. There is no rebound and no guarding.  Genitourinary: Prostate normal. Rectal exam shows external hemorrhoid (R posterior noninflamed). Rectal exam shows no internal hemorrhoid, no fissure, no mass, no tenderness and anal tone normal. Prostate is not enlarged (20gm) and not tender.   Musculoskeletal: Normal range of motion. He exhibits no edema.  Lymphadenopathy:    He has no cervical adenopathy.  Neurological: He is alert and oriented to person, place, and time.  CN grossly intact, station and gait intact 3/3 recall 5/5 concentration/calculation serial 7s  Skin: Skin is warm and dry. No rash noted.  Psychiatric: He has a normal mood and affect. His behavior is normal. Judgment and thought content normal.   Results for orders placed in visit on 03/09/14  LIPID PANEL      Result Value Ref Range   Cholesterol 206 (*) 0 - 200 mg/dL   Triglycerides 54.0  0.0 - 149.0 mg/dL   HDL 52.30  >39.00 mg/dL   VLDL 10.8  0.0 - 40.0 mg/dL   LDL Cholesterol 143 (*) 0 - 99 mg/dL   Total CHOL/HDL Ratio 4    COMPREHENSIVE METABOLIC PANEL      Result Value Ref Range   Sodium 141  135 - 145 mEq/L   Potassium 4.5  3.5 - 5.1 mEq/L   Chloride 104  96 - 112 mEq/L   CO2 30  19 - 32 mEq/L   Glucose, Bld 138 (*) 70 - 99 mg/dL  BUN 15  6 - 23 mg/dL   Creatinine, Ser 0.8  0.4 - 1.5 mg/dL   Total Bilirubin 1.2  0.3 - 1.2 mg/dL   Alkaline Phosphatase 55  39 - 117 U/L   AST 20  0 - 37 U/L   ALT 21  0 - 53 U/L   Total Protein 6.0  6.0 - 8.3 g/dL   Albumin 3.9  3.5 - 5.2 g/dL   Calcium 8.7  8.4 - 10.5 mg/dL   GFR 95.12  >60.00 mL/min  PSA      Result Value Ref Range   PSA 1.20  0.10 - 4.00 ng/mL      Assessment & Plan:   Problem List Items Addressed This Visit   DOE (dyspnea on exertion)     Check echo once lung workup complete.  Consider spirometry to eval COPD in h/o smoker.    History of diabetes mellitus, type II   HYPERLIPIDEMIA     H/o fatty liver. Intolerant to statins even RYR. Discussed continued diet control of hyperlipidemia. Consider zetia, ?pitavastatin.    HYPERTENSION     Chronic, mild elevation today.  Continue current regimen without changes.    Medicare annual wellness visit, subsequent - Primary     I have personally reviewed the Medicare Annual  Wellness questionnaire and have noted 1. The patient's medical and social history 2. Their use of alcohol, tobacco or illicit drugs 3. Their current medications and supplements 4. The patient's functional ability including ADL's, fall risks, home safety risks and hearing or visual impairment. 5. Diet and physical activity 6. Evidence for depression or mood disorders The patients weight, height, BMI have been recorded in the chart.  Hearing and vision has been addressed. I have made referrals, counseling and provided education to the patient based review of the above and I have provided the pt with a written personalized care plan for preventive services. See scanned questionairre. Advanced directives discussed: would want wife to be HCPOA.  Would not want prolonged life support but is a full code  Reviewed preventative protocols and updated unless pt declined. UTD colonoscopy and prostate screen.    Systolic murmur     Will recheck echo once lung infection fully resolved.        Follow up plan: Return in about 4 months (around 07/16/2014), or as needed, for follow up.

## 2014-03-16 NOTE — Assessment & Plan Note (Signed)
H/o fatty liver. Intolerant to statins even RYR. Discussed continued diet control of hyperlipidemia. Consider zetia, ?pitavastatin.

## 2014-03-16 NOTE — Patient Instructions (Signed)
We may get ultrasound of heart and recheck lung function in the future but I want to make sure lungs are better first. Watch cholesterol in diet. Good to see you today, call us with quesitons.

## 2014-03-16 NOTE — Assessment & Plan Note (Signed)
Will recheck echo once lung infection fully resolved.

## 2014-03-16 NOTE — Progress Notes (Signed)
Pre visit review using our clinic review tool, if applicable. No additional management support is needed unless otherwise documented below in the visit note. 

## 2014-03-16 NOTE — Assessment & Plan Note (Addendum)
-   see CT chest 02/23/14   Still has localized wheeze on L suggesting airway involvement and also nodule L apex that should be reachable by fob/tbbx in event that airway is actually patent.  Discussed in detail all the  indications, usual  risks and alternatives  relative to the benefits with patient who agrees to proceed with bronchoscopy with biopsy.  Advised d/c advil, denies using asa.

## 2014-03-16 NOTE — Assessment & Plan Note (Signed)
Chronic, mild elevation today.  Continue current regimen without changes.

## 2014-03-16 NOTE — Assessment & Plan Note (Signed)
I have personally reviewed the Medicare Annual Wellness questionnaire and have noted 1. The patient's medical and social history 2. Their use of alcohol, tobacco or illicit drugs 3. Their current medications and supplements 4. The patient's functional ability including ADL's, fall risks, home safety risks and hearing or visual impairment. 5. Diet and physical activity 6. Evidence for depression or mood disorders The patients weight, height, BMI have been recorded in the chart.  Hearing and vision has been addressed. I have made referrals, counseling and provided education to the patient based review of the above and I have provided the pt with a written personalized care plan for preventive services. See scanned questionairre. Advanced directives discussed: would want wife to be HCPOA.  Would not want prolonged life support but is a full code  Reviewed preventative protocols and updated unless pt declined. UTD colonoscopy and prostate screen.

## 2014-03-16 NOTE — Assessment & Plan Note (Signed)
Check echo once lung workup complete.  Consider spirometry to eval COPD in h/o smoker.

## 2014-03-16 NOTE — Patient Instructions (Addendum)
Come to outpatient registration at [facility] (behind the ER) at 7  am Thursday with nothing to eat or drink after midnight Thursday.for Cxr then Bronchoscopy to follow.

## 2014-03-16 NOTE — Progress Notes (Signed)
Subjective:    Patient ID: Shawn Espinoza, male    DOB: Mar 29, 1941 .   MRN: [id number]    Brief patient profile:  70 yowm quit smoking at birth of granddaughter in 1990 new onset recurrent bronchitis x 2000 referred to pulmonary clinic 03/02/2014 by Dr Danise Mina for sob and abn CT chest   History of Present Illness  03/02/2014 1st [place] [facility] office visit/ Shley Dolby  Chief Complaint  Patient presents with  . Pulmonary Consult    Referred per Dr. Leo Grosser. Pt c/o SOB and cough for the past 5 months- worse for the past month. He states gets SOB with minimal exertion such as swinging a golf club.  Cough is prod with moderate white to yellow sputum.   last week of Feb 2015  abrupt onset L pleuritic CP,   much worse  cough yellow mucus, more sob than usual  feeling > 75 % better p zmax and augmentin started on  02/22/14  -    hasn't finished augmentin yet  and now can take deep breath s hurting where could not previously and also do eliptical x 20 min on day of ov. rec Finish the abx   03/16/2014 f/u ov/Cambren Helm re: abn CT  Chief Complaint  Patient presents with  . Follow-up    SOB and cough have improved some, but not back to baseline. No new co's today.   80% back to baseline still winded playing golf/ can only do 9 holes, and cough >yellow never bloody.  No obvious day to day or daytime variabilty or assoc chronic cough or cp or chest tightness, subjective wheeze overt sinus or hb symptoms. No unusual exp hx or h/o childhood pna/ asthma or knowledge of premature birth.  Sleeping ok without nocturnal  or early am exacerbation  of respiratory  c/o's or need for noct saba. Also denies any obvious fluctuation of symptoms with weather or environmental changes or other aggravating or alleviating factors except as outlined above   Current Medications, Allergies, Complete Past Medical History, Past Surgical History, Family History, and Social History were reviewed in [employer] record.  ROS  The following are not active complaints unless bolded sore throat, dysphagia, dental problems, itching, sneezing,  nasal congestion or excess/ purulent secretions, ear ache,   fever, chills, sweats, unintended wt loss, pleuritic or exertional cp, hemoptysis,  orthopnea pnd or leg swelling, presyncope, palpitations, heartburn, abdominal pain, anorexia, nausea, vomiting, diarrhea  or change in bowel or urinary habits, change in stools or urine, dysuria,hematuria,  rash, arthralgias, visual complaints, headache, numbness weakness or ataxia or problems with walking or coordination,  change in mood/affect or memory.                          Objective:   Physical Exam  03/16/2014       177 Wt Readings from Last 3 Encounters:  03/02/14 179 lb 9.6 oz (81.466 kg)  02/22/14 181 lb (82.101 kg)  01/18/14 182 lb 8 oz (82.781 kg)      HEENT: nl dentition, turbinates, and orophanx. Nl external ear canals without cough reflex   NECK :  without JVD/Nodes/TM/ nl carotid upstrokes bilaterally   LUNGS: no acc muscle use, insp and exp rhonchi localized to L lung ant > post   CV:  RRR  no s3 or  increase in P2, no edema -  III/VI sem  ABD:  soft and nontender with nl excursion  in the supine position. No bruits or organomegaly, bowel sounds nl  MS:  warm without deformities, calf tenderness, cyanosis or clubbing  SKIN: warm and dry without lesions    NEURO:  alert, approp, no deficits    CT chest  02/23/14 Abnormal findings in the left lung as described above including  patchy ground-glass attenuation, thickened interstitium, left upper  and left lower lobe pulmonary nodules and small to moderate left  pleural effusion. Central airway thickening. Partially calcified  left hilar and mediastinal lymph nodes/adenopathy. Constellation of  findings is nonspecific. This certainly could be infectious.  Atypical organisms should be considered including tuberculosis.   However, I cannot exclude neoplastic process. Of particular concern  is the left lower lobe nodule causing retraction of the pleural  laterally. Also of concern is the enhancing 4.6 cm lesion within the  liver. Consider pulmonology consultation.  IPPD 02/28/14 neg        Assessment & Plan:

## 2014-03-17 ENCOUNTER — Encounter (HOSPITAL_COMMUNITY)
Admission: RE | Disposition: A | Discharge status: Home / Self Care | Payer: Self-pay | Source: Ambulatory Visit | Attending: Internal Medicine

## 2014-03-17 ENCOUNTER — Encounter (HOSPITAL_COMMUNITY): Payer: Self-pay | Admitting: Respiratory Therapy

## 2014-03-17 ENCOUNTER — Ambulatory Visit (HOSPITAL_COMMUNITY)
Admission: RE | Admit: 2014-03-17 | Discharge: 2014-03-17 | Disposition: A | Discharge status: Home / Self Care | Payer: Medicare Other | Source: Ambulatory Visit | Attending: Internal Medicine | Admitting: Internal Medicine

## 2014-03-17 ENCOUNTER — Ambulatory Visit (HOSPITAL_COMMUNITY): Payer: Medicare Other

## 2014-03-17 DIAGNOSIS — Z7982 Long term (current) use of aspirin: Secondary | ICD-10-CM | POA: Insufficient documentation

## 2014-03-17 DIAGNOSIS — R9389 Abnormal findings on diagnostic imaging of other specified body structures: Secondary | ICD-10-CM

## 2014-03-17 DIAGNOSIS — Z79899 Other long term (current) drug therapy: Secondary | ICD-10-CM | POA: Insufficient documentation

## 2014-03-17 DIAGNOSIS — E785 Hyperlipidemia, unspecified: Secondary | ICD-10-CM | POA: Insufficient documentation

## 2014-03-17 DIAGNOSIS — I10 Essential (primary) hypertension: Secondary | ICD-10-CM | POA: Insufficient documentation

## 2014-03-17 DIAGNOSIS — Z8701 Personal history of pneumonia (recurrent): Secondary | ICD-10-CM | POA: Insufficient documentation

## 2014-03-17 DIAGNOSIS — J90 Pleural effusion, not elsewhere classified: Secondary | ICD-10-CM | POA: Insufficient documentation

## 2014-03-17 DIAGNOSIS — C3410 Malignant neoplasm of upper lobe, unspecified bronchus or lung: Secondary | ICD-10-CM | POA: Insufficient documentation

## 2014-03-17 DIAGNOSIS — Z87891 Personal history of nicotine dependence: Secondary | ICD-10-CM | POA: Insufficient documentation

## 2014-03-17 HISTORY — PX: VIDEO BRONCHOSCOPY: SHX5072

## 2014-03-17 SURGERY — BRONCHOSCOPY, WITH FLUOROSCOPY
Anesthesia: Moderate Sedation | Laterality: Bilateral

## 2014-03-17 MED ORDER — MIDAZOLAM HCL 10 MG/2ML IJ SOLN: 1.0000 mg | Freq: Once | INTRAMUSCULAR | Status: DC

## 2014-03-17 MED ORDER — MEPERIDINE HCL 100 MG/ML IJ SOLN
INTRAMUSCULAR | Status: AC
  Filled 2014-03-17: qty 2

## 2014-03-17 MED ORDER — LIDOCAINE HCL 1 % IJ SOLN
INTRAMUSCULAR | Status: DC | PRN
  Administered 2014-03-17: 6 mL via RESPIRATORY_TRACT

## 2014-03-17 MED ORDER — SODIUM CHLORIDE 0.9 % IV SOLN
INTRAVENOUS | Status: DC
  Administered 2014-03-17: 08:00:00 via INTRAVENOUS

## 2014-03-17 MED ORDER — LIDOCAINE HCL 2 % EX GEL: Freq: Once | CUTANEOUS | Status: DC

## 2014-03-17 MED ORDER — MEPERIDINE HCL 100 MG/ML IJ SOLN: 100.0000 mg | Freq: Once | INTRAMUSCULAR | Status: DC

## 2014-03-17 MED ORDER — MIDAZOLAM HCL 10 MG/2ML IJ SOLN
INTRAMUSCULAR | Status: AC
  Filled 2014-03-17: qty 4

## 2014-03-17 MED ORDER — PHENYLEPHRINE HCL 0.25 % NA SOLN
NASAL | Status: DC | PRN
  Administered 2014-03-17: 1 via NASAL

## 2014-03-17 MED ORDER — PHENYLEPHRINE HCL 0.25 % NA SOLN: 1.0000 | Freq: Four times a day (QID) | NASAL | Status: DC | PRN

## 2014-03-17 MED ORDER — LIDOCAINE HCL 2 % EX GEL
CUTANEOUS | Status: DC | PRN
  Administered 2014-03-17: 1

## 2014-03-17 NOTE — Discharge Instructions (Signed)
Flexible Bronchoscopy, Care After These instructions give you information on caring for yourself after your procedure. Your doctor may also give you more specific instructions. Call your doctor if you have any problems or questions after your procedure. HOME CARE  Do not eat or drink anything for 2 hours after your procedure. If you try to eat or drink before the medicine wears off, food or drink could go into your lungs. You could also burn yourself.  After 2 hours have passed and when you can cough and gag normally, you may eat soft food and drink liquids slowly.  The day after the test, you may eat your normal diet.  You may do your normal activities.  Keep all doctor visits. GET HELP RIGHT AWAY IF:  You get more and more short of breath.  You get lightheaded.  You feel like you are going to pass out (faint).  You have chest pain.  You have new problems that worry you.  You cough up more than a little blood.  You cough up more blood than before. MAKE SURE YOU:  Understand these instructions.  Will watch your condition.  Will get help right away if you are not doing well or get worse. Document Released: 10/06/2009 Document Revised: 09/29/2013 Document Reviewed: 08/13/2013 [facility] Patient Information 2014 [place].  Nothing to eat or drink until   11:00  am  Today   03/17/2014

## 2014-03-17 NOTE — Progress Notes (Signed)
Video Bronchoscopy Done  Intervention Bronchial Biopsy done Intervention Bronchial washing done  Procedure tolerated well

## 2014-03-17 NOTE — Op Note (Signed)
Bronchoscopy Procedure Note  Date of Operation: 03/17/2014   Pre-op Diagnosis: Pulmonary infiltrates  Post-op Diagnosis: same  Surgeon: Christinia Gully  Anesthesia: Monitored Local Anesthesia with Sedation  Operation: Video Flexible fiberoptic bronchoscopy, diagnostic   Findings: moderate concentric narrowing LUL AP segment but no mucosal abnormalities  Specimen: washings/ tbbx LUL  Estimated Blood Loss: < 89VQ  Complications: mild bleeding p 3rd tbbx so terminated procedure and placed in LLat decub and cleared airways of residual blood  Indications and History: See updated H and P same date. The risks, benefits, complications, treatment options and expected outcomes were discussed with the patient.  The possibilities of reaction to medication, pulmonary aspiration, perforation of a viscus, bleeding, failure to diagnose a condition and creating a complication requiring transfusion or operation were discussed with the patient who freely signed the consent.    Description of Procedure: The patient was re-examined in the bronchoscopy suite and the site of surgery properly noted/marked.  The patient was identified  and the procedure verified as Flexible Fiberoptic Bronchoscopy.  A Time Out was held and the above information confirmed.   After the induction of topical nasopharyngeal anesthesia, the patient was positioned  and the bronchoscope was passed through the R naris. The vocal cords were visualized and  1% buffered lidocaine 5 ml was topically placed onto the cords. The cords were nl. The scope was then passed into the trachea.  1% buffered lidocaine given topically. Airways inspected bilaterally to the subsegmental level with the following findings:  1) all airways opened widely to subsegmental level bilaterally and all were nl except:  2)  LUL AP segment was a conc narrowed to about 50% of nl but no mucosal abn  Interventions: 1) BAL LUL with good return> sent for cytology/ afb and  fungal stain and culture 2) Tbbx LUL x 3 good sampling by fluoro  F/u cxr no ptx, prob alv hem mild     The Patient was taken to the Endoscopy Recovery area in satisfactory condition.  Attestation: I performed the procedure.  Christinia Gully, MD Pulmonary and [place] [phone] After 5:30 PM or weekends, call [phone]

## 2014-03-17 NOTE — H&P (Signed)
Brief patient profile:  69 yowm quit smoking at birth of granddaughter in 3 new onset recurrent bronchitis x 2000 referred to pulmonary clinic 03/02/2014 by Dr Danise Mina for sob and abn CT chest     History of Present Illness   03/02/2014 1st [facility] office visit/ Katy Brickell   Chief Complaint   Patient presents with   .  Pulmonary Consult       Referred per Dr. Leo Grosser. Pt c/o SOB and cough for the past 5 months- worse for the past month. He states gets SOB with minimal exertion such as swinging a golf club.  Cough is prod with moderate white to yellow sputum.     last week of Feb 2015  abrupt onset L pleuritic CP,   much worse  cough yellow mucus, more sob than usual  feeling > 75 % better p zmax and augmentin started on  02/22/14  -    hasn't finished augmentin yet  and now can take deep breath s hurting where could not previously and also do eliptical x 20 min on day of ov. rec Finish the abx     03/16/2014 f/u ov/Maghan Jessee re: abn CT   Chief Complaint   Patient presents with   .  Follow-up       SOB and cough have improved some, but not back to baseline. No new co's today.     80% back to baseline still winded playing golf/ can only do 9 holes, and cough >yellow never bloody.   No obvious day to day or daytime variabilty or assoc chronic cough or cp or chest tightness, subjective wheeze overt sinus or hb symptoms. No unusual exp hx or h/o childhood pna/ asthma or knowledge of premature birth.   Sleeping ok without nocturnal  or early am exacerbation  of respiratory  c/o's or need for noct saba. Also denies any obvious fluctuation of symptoms with weather or environmental changes or other aggravating or alleviating factors except as outlined above    Current Medications, Allergies, Complete Past Medical History, Past Surgical History, Family History, and Social History were reviewed in [employer] record.   ROS  The following are not active complaints  unless bolded sore throat, dysphagia, dental problems, itching, sneezing,  nasal congestion or excess/ purulent secretions, ear ache,   fever, chills, sweats, unintended wt loss, pleuritic or exertional cp, hemoptysis,  orthopnea pnd or leg swelling, presyncope, palpitations, heartburn, abdominal pain, anorexia, nausea, vomiting, diarrhea  or change in bowel or urinary habits, change in stools or urine, dysuria,hematuria,  rash, arthralgias, visual complaints, headache, numbness weakness or ataxia or problems with walking or coordination,  change in mood/affect or memory.                                  Objective:     Physical Exam   03/16/2014       177 Wt Readings from Last 3 Encounters:   03/02/14  179 lb 9.6 oz (81.466 kg)   02/22/14  181 lb (82.101 kg)   01/18/14  182 lb 8 oz (82.781 kg)         HEENT: nl dentition, turbinates, and orophanx. Nl external ear canals without cough reflex     NECK :  without JVD/Nodes/TM/ nl carotid upstrokes bilaterally     LUNGS: no acc muscle use, insp and exp rhonchi localized to L lung ant > post  CV:  RRR  no s3 or  increase in P2, no edema -  III/VI sem   ABD:  soft and nontender with nl excursion in the supine position. No bruits or organomegaly, bowel sounds nl   MS:  warm without deformities, calf tenderness, cyanosis or clubbing   SKIN: warm and dry without lesions     NEURO:  alert, approp, no deficits     CT chest  02/23/14 Abnormal findings in the left lung as described above including   patchy ground-glass attenuation, thickened interstitium, left upper   and left lower lobe pulmonary nodules and small to moderate left   pleural effusion. Central airway thickening. Partially calcified   left hilar and mediastinal lymph nodes/adenopathy. Constellation of   findings is nonspecific. This certainly could be infectious.   Atypical organisms should be considered including tuberculosis.   However, I  cannot exclude neoplastic process. Of particular concern   is the left lower lobe nodule causing retraction of the pleural   laterally. Also of concern is the enhancing 4.6 cm lesion within the   liver. Consider pulmonology consultation.   IPPD 02/28/14 neg            Assessment & Plan:               Abnormal CT of the chest -      - see CT chest 02/23/14    Still has localized wheeze on L suggesting airway involvement and also nodule L apex that should be reachable by fob/tbbx in event that airway is actually patent.   Discussed in detail all the  indications, usual  risks and alternatives  relative to the benefits with patient who agrees to proceed with bronchoscopy with biopsy.  Advised d/c advil, denies using asa.     03/17/2014 Day of FOB no change hx or exam   Christinia Gully, MD Pulmonary and [place] [phone] After 5:30 PM or weekends, call [phone]

## 2014-03-18 ENCOUNTER — Encounter (HOSPITAL_COMMUNITY): Payer: Self-pay | Admitting: Internal Medicine

## 2014-03-21 ENCOUNTER — Other Ambulatory Visit: Payer: Self-pay

## 2014-03-21 ENCOUNTER — Other Ambulatory Visit: Payer: Self-pay | Admitting: Internal Medicine

## 2014-03-21 ENCOUNTER — Encounter: Payer: Self-pay | Admitting: Internal Medicine

## 2014-03-21 DIAGNOSIS — C3490 Malignant neoplasm of unspecified part of unspecified bronchus or lung: Secondary | ICD-10-CM

## 2014-03-21 DIAGNOSIS — C3492 Malignant neoplasm of unspecified part of left bronchus or lung: Secondary | ICD-10-CM | POA: Insufficient documentation

## 2014-03-22 ENCOUNTER — Telehealth: Payer: Self-pay | Admitting: *Deleted

## 2014-03-22 NOTE — Telephone Encounter (Signed)
Called pt with appt for Shawn Espinoza 03/24/14.  He verbalized understanding of appt time and place.

## 2014-03-24 ENCOUNTER — Ambulatory Visit (HOSPITAL_BASED_OUTPATIENT_CLINIC_OR_DEPARTMENT_OTHER): Payer: Medicare Other | Admitting: Internal Medicine

## 2014-03-24 ENCOUNTER — Ambulatory Visit: Payer: Medicare Other

## 2014-03-24 ENCOUNTER — Ambulatory Visit: Payer: Medicare Other | Attending: Internal Medicine | Admitting: Physical Therapy

## 2014-03-24 ENCOUNTER — Encounter: Payer: Self-pay | Admitting: *Deleted

## 2014-03-24 ENCOUNTER — Telehealth: Payer: Self-pay | Admitting: Internal Medicine

## 2014-03-24 ENCOUNTER — Encounter: Payer: Self-pay | Admitting: Internal Medicine

## 2014-03-24 ENCOUNTER — Ambulatory Visit (HOSPITAL_BASED_OUTPATIENT_CLINIC_OR_DEPARTMENT_OTHER): Payer: Medicare Other

## 2014-03-24 VITALS — BP 167/90 | HR 77 | Temp 98.1°F | Resp 20 | Ht 69.0 in | Wt 178.5 lb

## 2014-03-24 DIAGNOSIS — K769 Liver disease, unspecified: Secondary | ICD-10-CM

## 2014-03-24 DIAGNOSIS — C3410 Malignant neoplasm of upper lobe, unspecified bronchus or lung: Secondary | ICD-10-CM

## 2014-03-24 DIAGNOSIS — R0602 Shortness of breath: Secondary | ICD-10-CM | POA: Insufficient documentation

## 2014-03-24 DIAGNOSIS — K7689 Other specified diseases of liver: Secondary | ICD-10-CM

## 2014-03-24 DIAGNOSIS — C3490 Malignant neoplasm of unspecified part of unspecified bronchus or lung: Secondary | ICD-10-CM

## 2014-03-24 DIAGNOSIS — IMO0001 Reserved for inherently not codable concepts without codable children: Secondary | ICD-10-CM | POA: Insufficient documentation

## 2014-03-24 DIAGNOSIS — J90 Pleural effusion, not elsewhere classified: Secondary | ICD-10-CM

## 2014-03-24 LAB — COMPREHENSIVE METABOLIC PANEL (CC13)
ALBUMIN: 3.8 g/dL (ref 3.5–5.0)
ALT: 15 U/L (ref 0–55)
AST: 16 U/L (ref 5–34)
Alkaline Phosphatase: 69 U/L (ref 40–150)
Anion Gap: 12 mEq/L — ABNORMAL HIGH (ref 3–11)
BUN: 16.7 mg/dL (ref 7.0–26.0)
CALCIUM: 9.2 mg/dL (ref 8.4–10.4)
CHLORIDE: 107 meq/L (ref 98–109)
CO2: 23 meq/L (ref 22–29)
Creatinine: 0.7 mg/dL (ref 0.7–1.3)
GLUCOSE: 123 mg/dL (ref 70–140)
POTASSIUM: 4.2 meq/L (ref 3.5–5.1)
Sodium: 142 mEq/L (ref 136–145)
TOTAL PROTEIN: 6.5 g/dL (ref 6.4–8.3)
Total Bilirubin: 0.91 mg/dL (ref 0.20–1.20)

## 2014-03-24 LAB — CBC WITH DIFFERENTIAL/PLATELET
BASO%: 0.9 % (ref 0.0–2.0)
Basophils Absolute: 0.1 10*3/uL (ref 0.0–0.1)
EOS%: 3.1 % (ref 0.0–7.0)
Eosinophils Absolute: 0.2 10*3/uL (ref 0.0–0.5)
HEMATOCRIT: 50.8 % — AB (ref 38.4–49.9)
HGB: 17 g/dL (ref 13.0–17.1)
LYMPH%: 13.2 % — AB (ref 14.0–49.0)
MCH: 31.3 pg (ref 27.2–33.4)
MCHC: 33.5 g/dL (ref 32.0–36.0)
MCV: 93.5 fL (ref 79.3–98.0)
MONO#: 0.6 10*3/uL (ref 0.1–0.9)
MONO%: 8.2 % (ref 0.0–14.0)
NEUT#: 5.7 10*3/uL (ref 1.5–6.5)
NEUT%: 74.6 % (ref 39.0–75.0)
PLATELETS: 163 10*3/uL (ref 140–400)
RBC: 5.43 10*6/uL (ref 4.20–5.82)
RDW: 12.9 % (ref 11.0–14.6)
WBC: 7.6 10*3/uL (ref 4.0–10.3)
lymph#: 1 10*3/uL (ref 0.9–3.3)

## 2014-03-24 LAB — AFP TUMOR MARKER: AFP-Tumor Marker: 4.3 ng/mL (ref 0.0–8.0)

## 2014-03-24 NOTE — Progress Notes (Signed)
[facility] [phone]   [phone] Multidisciplinary thoracic oncology clinic ([facility])  CONSULT NOTE  REFERRING PHYSICIAN: Dr. Christinia Gully  REASON FOR CONSULTATION:  73 years old white male recently diagnosed with lung cancer  HPI Shawn Espinoza is a 73 y.o. male with a past medical history significant for hypertension, diabetes mellitus, dyslipidemia, history of fatty liver, hemorrhoids, diverticulosis, benign prostatic hypertrophy as well as history of colon polyps and low back pain and long history of smoking but quit in 1990. The patient mentions that he has frequent episodes of bronchitis almost on an annual basis since 2000. Recently he was getting weaker and more short of breath and congested with minimal exertion. He was seen by his primary care physician and has chest x-ray performed on 02/23/2014 and it showed mild fullness in the left hilar region with diffuse interstitial prominence throughout the left lung predominantly in the left lower lobe. This was followed by CT scan of the chest with contrast on 02/24/2014 and it showed patchy groundglass attenuation throughout the left upper lobe and left lower lobe. There was thickening of the peri-bronchovascular interstitium noted. There are left hilar and AP window/prevascular calcified lymph nodes. The largest is AP window/prevascular measuring 2.0 CM in short axis diameter. These abnormalities causing narrowing of the central airway in the left upper lobe and left lower lobe. In addition, there is a 1.3 CM nodule in the left upper lobe with fairly central cavitation there is also posterior left upper lobe nodule along the major fissure measuring 1.2 CM and lateral left lower lobe nodule measuring 1.5 CM and abuts and retract the pleura. There is associated with small to moderate left pleural effusion. There was also mildly prominent right paratracheal and prevascular lymph nodes.imaging of the upper  abdomen demonstrates a 4.6 CM lesion within the right hepatic lobe predominantly solid with probable central enhancement. The patient was referred to Dr. Melvyn Novas and on 03/17/2014, he underwent video flexible fiberoptic bronchoscopy was washing and transbronchial biopsy of the left upper lobe. The bronchoscopy showed old airways were open widely 2 subsegmental level bilaterally except for left upper lobe AP window segment that was narrowed to about 50% of normal but no mucosal abnormalities. The final pathology (Accession: [id number]) was consistent with adenocarcinoma. Immunohistochemical stains are performed. The tumor is positive for cytokeratin 7, TTF-1 and Napsin A. The tumor is negative for calretinin. The morphology coupled with the immunohistochemical staining pattern is compatible with an adenocarcinoma of lung primary  source. Dr. Melvyn Novas kindly referred the patient to me today for further evaluation and recommendation regarding treatment of his condition. When seen today the patient continues to complain of cough productive of blood-tinged sputum started after the bronchoscopy. He also has tightness of his chest with shortness breath with exertion. He lost around 20 pounds in the last 2 years but part of it was intentional. He also has occasional night sweats but this has been going on for years. The patient and has headache and occasional dizzy spells but denied having any bleeding vision. There was a concern that the patient may have hepatitis C but he was seen by a hepatologist and this was ruled out. Family history significant for a mother who had a stroke and father had prostate cancer at age 24. The patient is married and has 2 children a son and a daughter. He was accompanied by his wife Shawn Espinoza. He used to work in [employer] and currently works in a golf course.  He has a history of smoking up to 3 packs per day for around 30 years but quit in 1990. He also drinks up to 3 glasses of wine every day. No  history of drug abuse. HPI  Past Medical History  Diagnosis Date  . History of diabetes mellitus, type II     resolved with diet, h/o neuropathy  . Diverticulosis of colon   . Hyperlipidemia   . Hypertension   . Fatty liver 02/29/00    abd ultrasound  . Lower back pain   . Systolic murmur 2263    2Decho - normal LV fxn, EF 55%, mild AS, biatrial enlargement  . History of tobacco use quit 1990s  . 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 Personal history of colonic adenomas 07/06/2013    Past Surgical History  Procedure Laterality Date  . Colonoscopy  2004  . Colonoscopy  2014    tubular adenoma x1, mod diverticulosis ([facility])  . Flexible bronchoscopy  02/2014    WNL  . Video bronchoscopy Bilateral 03/17/2014    Procedure: VIDEO BRONCHOSCOPY WITH FLUORO;  Surgeon: Tanda Rockers, MD;  Location: [facility];  Service: Cardiopulmonary;  Laterality: Bilateral;    Family History  Problem Relation Age of Onset  . Stroke Mother     multiple mini strokes  . Hypertension Mother   . Heart disease Maternal Grandfather     MI  . Stroke Paternal Grandmother   . Colon cancer Neg Hx     Social History History  Substance Use Topics  . Smoking status: Former Smoker -- 3.00 packs/day for 30 years    Types: Cigarettes    Quit date: 12/23/1988  . Smokeless tobacco: Never Used  . Alcohol Use: 12.6 oz/week    21 Glasses of wine per week     Comment: 3-4 wine a day ( 8/03- 2 beers, 2 wines, 2 brandies daily)    Allergies  Allergen Reactions  . Candesartan Cilexetil     REACTION: Muscle spasms  . Diltiazem Hcl     REACTION: Dizziness  . Doxazosin Mesylate Other (See Comments)    REACTION: H/A's  . Nifedipine     REACTION: Leg swelling  . Pravastatin Other (See Comments)    Muscle aches  . Red Yeast Rice     Muscle aches  . Rosuvastatin Other (See Comments)    REACTION: questionable:  severe constipation  . Simvastatin Other (See Comments)    REACTION: Muscle aches    Current Outpatient Prescriptions  Medication Sig Dispense Refill  . amLODipine (NORVASC) 5 MG tablet Take 1 tablet (5 mg total) by mouth daily.  90 tablet  1  . atenolol (TENORMIN) 25 MG tablet Take 1 tablet (25 mg total) by mouth 2 (two) times daily.  180 tablet  3  . cholecalciferol (VITAMIN D) 1000 UNITS tablet Take 1,000 Units by mouth daily.      . Digestive Enzymes (PAPAYA AND ENZYMES PO) Take by mouth as needed.        Marland Kitchen glucose blood (ONE TOUCH ULTRA TEST) test strip USE ONE STRIP TO CHECK GLUCOSE ONCE DAILY IN THE MORNING 4 TO 5 DAYS A WEEK Dx:250.00  100 each  3  . guaiFENesin (MUCINEX) 600 MG 12 hr tablet Take by mouth 2 (two) times daily.      Marland Kitchen ibuprofen (ADVIL,MOTRIN) 800 MG tablet Take 1 tablet (800 mg  total) by mouth 2 (two) times daily as needed.  40 tablet  1  . Lancets (ONETOUCH ULTRASOFT) lancets Use to check sugar 4-5 times weekly. Dx: 250.00  100 each  3  . methocarbamol (ROBAXIN) 500 MG tablet Take 500 mg by mouth every 8 (eight) hours as needed.      . Milk Thistle 200 MG CAPS Take 1 capsule by mouth 2 (two) times daily.      . Multiple Vitamin (MULTIVITAMIN) tablet Take 1 tablet by mouth daily.        . Multiple Vitamins-Minerals (EYE VITAMINS PO) 2 (two) times daily. Due to macular degeneration      . Polyethylene Glycol 3350 (MIRALAX PO) Take by mouth as directed. Takes 1/2 dose PRN      . vitamin C (ASCORBIC ACID) 500 MG tablet Take 250 mg by mouth daily.        No current facility-administered medications for this visit.    Review of Systems  Constitutional: positive for anorexia, fatigue and weight loss Eyes: negative Ears, nose, mouth, throat, and face: negative Respiratory: positive for cough, dyspnea on exertion, hemoptysis and sputum Cardiovascular: negative Gastrointestinal: negative Genitourinary:negative Integument/breast: negative Hematologic/lymphatic:  negative Musculoskeletal:negative Neurological: positive for dizziness and headaches Behavioral/Psych: negative Endocrine: negative Allergic/Immunologic: negative  Physical Exam  AVW:PVXYI, healthy, no distress, well nourished, well developed and anxious SKIN: skin color, texture, turgor are normal, no rashes or significant lesions HEAD: Normocephalic, No masses, lesions, tenderness or abnormalities EYES: normal, PERRLA EARS: External ears normal, Canals clear OROPHARYNX:no exudate, no erythema and lips, buccal mucosa, and tongue normal  NECK: supple, no adenopathy, no JVD LYMPH:  no palpable lymphadenopathy, no hepatosplenomegaly LUNGS: clear to auscultation , and palpation HEART: regular rate & rhythm, no murmurs and no gallops ABDOMEN:abdomen soft, non-tender, normal bowel sounds and no masses or organomegaly BACK: Back symmetric, no curvature., No CVA tenderness EXTREMITIES:no joint deformities, effusion, or inflammation, no edema, no skin discoloration  NEURO: alert & oriented x 3 with fluent speech, no focal motor/sensory deficits  PERFORMANCE STATUS: ECOG 1  LABORATORY DATA: Lab Results  Component Value Date   WBC 6.6 02/22/2014   HGB 17.4* 02/22/2014   HCT 52.4* 02/22/2014   MCV 96.1 02/22/2014   PLT 172.0 02/22/2014      Chemistry      Component Value Date/Time   NA 141 03/09/2014 0834   K 4.5 03/09/2014 0834   CL 104 03/09/2014 0834   CO2 30 03/09/2014 0834   BUN 15 03/09/2014 0834   CREATININE 0.8 03/09/2014 0834   CREATININE 0.83 02/22/2014 1314      Component Value Date/Time   CALCIUM 8.7 03/09/2014 0834   ALKPHOS 55 03/09/2014 0834   AST 20 03/09/2014 0834   ALT 21 03/09/2014 0834   BILITOT 1.2 03/09/2014 0834       RADIOGRAPHIC STUDIES: Dg Chest 2 View  03/17/2014   CLINICAL DATA:  Abnormal chest CT.  Cough.  Pre bronchoscopy.  EXAM: CHEST  2 VIEW  COMPARISON:  CT chest February 23, 2014  FINDINGS: The heart size and mediastinal contours are within normal limits. There  is diffuse increased pulmonary interstitium and ground-glass opacity throughout the left upper and left lower lobes. There is minimal left pleural effusion. The right lung is clear. Degenerative joint changes of the spine are noted.  IMPRESSION: Diffuse increased pulmonary interstitium and ground-glass opacity throughout the left upper and lower lobes which were seen on prior CT. Minimal left pleural effusion.   Electronically  Signed   By: Abelardo Diesel M.D.   On: 03/17/2014 08:25   Ct Chest W Contrast  02/23/2014   CLINICAL DATA:  Cough. Chest tightness, increased shortness of breath.  EXAM: CT CHEST WITH CONTRAST  TECHNIQUE: Multidetector CT imaging of the chest was performed during intravenous contrast administration.  CONTRAST:  52m OMNIPAQUE IOHEXOL 300 MG/ML  SOLN  COMPARISON:  DG CHEST 2 VIEW dated 02/22/2014  FINDINGS: There is patchy ground-glass attenuation throughout the left upper lobe and left lower lobe. Thickening of the peribronchovascular interstitium noted. There are left hilar and AP window/ prevascular calcified lymph nodes. The largest is AP window/prevascular measuring 2.0 cm in short axis diameter. These factors cause narrowing of the Central airway is in the left upper lobe and left lower lobe.  In addition, there is a 13 mm nodule in the left upper lobe on image 17 with early central cavitation. Posterior left upper lobe nodule along the major fissure on image 30 measures 12 mm. Lateral left lower lobe nodule on image 30 tumor 9 measures 15 mm. This abuts and retracts the pleura. There is associated small to moderate left pleural effusion.  Right lung is clear. No right pleural effusion. There are mildly prominent right peritracheal scratch head there are mildly prominent right paratracheal lymph nodes with an index node on image 17 having a short axis diameter of 13 mm. Prevascular node on the same image has a short axis diameter of 15 mm. Partially calcified subcarinal lymph nodes noted.   Imaging into the upper abdomen demonstrates a 4.6 cm lesion within the right hepatic lobe which is predominantly solid with probable central enhancement. No adrenal lesion or other acute findings in the upper abdomen.  No acute or focal bony abnormality.  IMPRESSION: Abnormal findings in the left lung as described above including patchy ground-glass attenuation, thickened interstitium, left upper and left lower lobe pulmonary nodules and small to moderate left pleural effusion. Central airway thickening. Partially calcified left hilar and mediastinal lymph nodes/adenopathy. Constellation of findings is nonspecific. This certainly could be infectious. Atypical organisms should be considered including tuberculosis. However, I cannot exclude neoplastic process. Of particular concern is the left lower lobe nodule causing retraction of the pleural laterally. Also of concern is the enhancing 4.6 cm lesion within the liver. Consider pulmonology consultation.   Electronically Signed   By: KRolm BaptiseM.D.   On: 02/23/2014 17:14   Dg Chest Port 1 View  03/17/2014   CLINICAL DATA:  Left upper lobe bronchoscopy and biopsies.  EXAM: PORTABLE CHEST - 1 VIEW  COMPARISON:  03/17/2014 at 7:03 a.m.  FINDINGS: Normal heart size and mediastinal contours. Increased opacification in the left lung diffusely, but most dense in the upper lobe. There is a left pleural effusion which is small and without evidence of increase. No pneumothorax. Clear right lung.  IMPRESSION: 1. Increased airspace opacification on the left, which could represent alveolar hemorrhage or bronchoscopy lavage. 2. No pneumothorax.   Electronically Signed   By: JJorje GuildM.D.   On: 03/17/2014 08:59   Dg C-arm Bronchoscopy  03/17/2014   CLINICAL DATA: bronch procedure   C-ARM BRONCHOSCOPY  Fluoroscopy was utilized by the requesting physician.  No radiographic  interpretation.     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   PLAN: I had a lengthy discussion with the patient and his wife today about his current disease stage, prognosis and treatment options.  I will complete the staging workup by ordering a PET scan as well as MRI of the brain. I would also send his tumor tissue to be tested for molecular biomarkers by [facility]. He would be considered for treatment with biologic targeted agent if he has any positive driver mutations like EGFR or ALK gene translocation. The final staging workup is consistent with a stage IV with no mutation, the patient would be considered for systemic chemotherapy with carboplatin and Alimta versus palliative care and hospice referral. He may be also considered for course of concurrent chemoradiation as his final staging is consistent with a stage IIIB. The patient and his wife were very anxious and upset about his current stage of the disease and prognosis. I would see the patient back for follow up visit in 2 weeks for reevaluation and more detailed discussion of his treatment options based on the final staging workup and molecular studies. I gave the patient and his wife the time to ask questions and I answered them completely to their satisfaction. The patient was seen at the multidisciplinary thoracic oncology clinic today by medical oncology, thoracic navigator, social worker as well as physical therapy. He was advised to call immediately if he has any concerning symptoms in the interval. The patient voices understanding of current disease status and treatment options and is in agreement with the current care plan.  All questions were answered. The patient knows to call the clinic with any problems, questions or concerns. We can certainly see the patient much sooner if  necessary.  Thank you so much for allowing me to participate in the care of Shawn Espinoza. I will continue to follow up the patient with you and assist in his care.  I spent 40 minutes counseling the patient face to face. The total time spent in the appointment was 60 minutes.  Disclaimer: This note was dictated with voice recognition software. Similar sounding words can inadvertently be transcribed and may not be corrected upon review.   Shawn Dangerfield K. 03/24/2014, 3:03 PM

## 2014-03-24 NOTE — Progress Notes (Signed)
[facility] Clinical Social Work  Clinical Social Work met with patient/family and Futures trader at [facility] appointment to offer support and assess for psychosocial needs.  Medical oncologist reviewed patient's diagnosis and need for further tests including a PET scan and MRI of brain.  The patient was accompanied by his wife of 35 years, Kittie.  The patient reports being very active and reports living a "healthy lifestyle".  After MD visit, the patient shares his biggest concern is the prognosis information shared by MD.  CSW and patient/family briefly processed emotions surrounding current prognosis at this time.   Clinical Social Work briefly discussed Clinical Social Work role and [employer] support programs/services.  Clinical Social Work encouraged patient to call with any additional questions or concerns.   Polo Riley, MSW, LCSW, OSW-C Clinical Social Worker [facility] [phone]

## 2014-03-24 NOTE — Telephone Encounter (Signed)
gve the pt his April 2015 appt calendar. Pt aware that he will be contacted with the mri appt by the rad dept.

## 2014-03-24 NOTE — [facility] Oncology Navigator Note (Unsigned)
   Thoracic Treatment Summary Name:Jaydon TAGEN MILBY Date:03/24/2014 DOB:Mar 18, 1941 Your Medical Team Medical Oncologist: Dr. [name]  Pulmonologist: Dr. Melvyn Novas  Type and Stage of Lung Cancer Non-Small Cell Carcinoma: Adenocarcinoma Clinical Stage:  III/V Pathological Stage:  Clinical stage is based on radiology exams.  Pathological stage will be determined after surgery.  Staging is based on the size of the tumor, involvement of lymph nodes or not, and whether or not the cancer center has spread. Recommendations Recommendations: PET/MRI Bain scans, possible liver scan, chemo class and chemotherapy  These recommendations are based on information available as of today's consult.  This is subject to change depending further testing or exams. Next Steps Next Step: 1. Medical Oncology will follow up on in 2 weeks  Barriers to Care What do you perceive as a potential barrier that may prevent you from receiving your treatment plan? No barrier perceived at this time Resources given NCI booklet on lung cancer  [facility] support services information [place] [phone] Questions Norton Blizzard, RN BSN Thoracic Oncology Nurse Navigator at [facility] is a nurse navigator that is available to assist you through your cancer journey.  She can answer your questions and/or provide resources regarding your treatment plan, emotional support, or financial concerns.

## 2014-03-26 DIAGNOSIS — C3490 Malignant neoplasm of unspecified part of unspecified bronchus or lung: Secondary | ICD-10-CM | POA: Insufficient documentation

## 2014-03-29 ENCOUNTER — Other Ambulatory Visit: Payer: Self-pay | Admitting: *Deleted

## 2014-03-29 DIAGNOSIS — C3490 Malignant neoplasm of unspecified part of unspecified bronchus or lung: Secondary | ICD-10-CM

## 2014-03-30 ENCOUNTER — Telehealth: Payer: Self-pay | Admitting: *Deleted

## 2014-03-30 ENCOUNTER — Ambulatory Visit (HOSPITAL_COMMUNITY)
Admission: RE | Admit: 2014-03-30 | Discharge: 2014-03-30 | Disposition: A | Discharge status: Home / Self Care | Payer: Medicare Other | Source: Ambulatory Visit | Attending: Internal Medicine | Admitting: Internal Medicine

## 2014-03-30 DIAGNOSIS — J90 Pleural effusion, not elsewhere classified: Secondary | ICD-10-CM | POA: Insufficient documentation

## 2014-03-30 DIAGNOSIS — K7689 Other specified diseases of liver: Secondary | ICD-10-CM | POA: Insufficient documentation

## 2014-03-30 DIAGNOSIS — C3490 Malignant neoplasm of unspecified part of unspecified bronchus or lung: Secondary | ICD-10-CM | POA: Insufficient documentation

## 2014-03-30 DIAGNOSIS — F40240 Claustrophobia: Secondary | ICD-10-CM

## 2014-03-30 LAB — GLUCOSE, CAPILLARY: Glucose-Capillary: 141 mg/dL — ABNORMAL HIGH (ref 70–99)

## 2014-03-30 MED ORDER — LORAZEPAM 0.5 MG PO TABS: 0.5000 mg | ORAL_TABLET | ORAL | Status: DC

## 2014-03-30 MED ORDER — FLUDEOXYGLUCOSE F - 18 (FDG) INJECTION
9.6000 | Freq: Once | INTRAVENOUS | Status: AC | PRN
  Administered 2014-03-30: 9.6 via INTRAVENOUS

## 2014-03-30 NOTE — Telephone Encounter (Signed)
Pt's wife called stating that pt is requesting something for anxiety p/t MRI of the brain on 04/01/14.  Per dr Vista Mink, okay to give 0.5mg  ativan x 1.  SLJ

## 2014-03-31 ENCOUNTER — Other Ambulatory Visit (HOSPITAL_COMMUNITY)
Admission: RE | Admit: 2014-03-31 | Discharge: 2014-03-31 | Disposition: A | Discharge status: Home / Self Care | Payer: Medicare Other | Source: Ambulatory Visit | Attending: Internal Medicine | Admitting: Internal Medicine

## 2014-03-31 ENCOUNTER — Ambulatory Visit (HOSPITAL_COMMUNITY): Payer: Medicare Other

## 2014-03-31 DIAGNOSIS — C3490 Malignant neoplasm of unspecified part of unspecified bronchus or lung: Secondary | ICD-10-CM | POA: Insufficient documentation

## 2014-04-01 ENCOUNTER — Encounter: Payer: Self-pay | Admitting: Internal Medicine

## 2014-04-01 ENCOUNTER — Ambulatory Visit
Admission: RE | Admit: 2014-04-01 | Discharge: 2014-04-01 | Disposition: A | Discharge status: Home / Self Care | Payer: Medicare Other | Source: Ambulatory Visit | Attending: Internal Medicine | Admitting: Internal Medicine

## 2014-04-01 ENCOUNTER — Other Ambulatory Visit: Payer: Self-pay | Admitting: Internal Medicine

## 2014-04-01 DIAGNOSIS — K769 Liver disease, unspecified: Secondary | ICD-10-CM

## 2014-04-01 DIAGNOSIS — C3490 Malignant neoplasm of unspecified part of unspecified bronchus or lung: Secondary | ICD-10-CM

## 2014-04-01 DIAGNOSIS — Z77018 Contact with and (suspected) exposure to other hazardous metals: Secondary | ICD-10-CM

## 2014-04-01 MED ORDER — GADOBENATE DIMEGLUMINE 529 MG/ML IV SOLN
16.0000 mL | Freq: Once | INTRAVENOUS | Status: AC | PRN
  Administered 2014-04-01: 16 mL via INTRAVENOUS

## 2014-04-04 ENCOUNTER — Encounter: Payer: Self-pay | Admitting: *Deleted

## 2014-04-04 NOTE — Progress Notes (Signed)
Received fax today from [facility].  Expected results 04/15/14.

## 2014-04-06 ENCOUNTER — Other Ambulatory Visit: Payer: Self-pay | Admitting: *Deleted

## 2014-04-06 ENCOUNTER — Encounter: Payer: Self-pay | Admitting: *Deleted

## 2014-04-06 ENCOUNTER — Telehealth: Payer: Self-pay | Admitting: *Deleted

## 2014-04-06 NOTE — Progress Notes (Signed)
Received call from [facility].  They stated tissue is insufficient for testing.  Dr. [name] aware.

## 2014-04-06 NOTE — Telephone Encounter (Signed)
Pt called stating that on his right [facility] he had a welt.  He had 2 different IV's there from the MRI and PET scan.  Starting yesterday he noticed the welt and it is irritated.  He has been using benadryl cream and cold compresseses.  Per dr Vista Mink, pt is probably an infiltrate r/t to the dye.  Apply warm compresses and hydrocortisone cream.  He has f/u with [facility] on Friday.  Pt verbalized understanding.  SLJ

## 2014-04-08 ENCOUNTER — Encounter: Payer: Self-pay | Admitting: Internal Medicine

## 2014-04-08 ENCOUNTER — Ambulatory Visit (HOSPITAL_BASED_OUTPATIENT_CLINIC_OR_DEPARTMENT_OTHER): Payer: Medicare Other | Admitting: Internal Medicine

## 2014-04-08 ENCOUNTER — Ambulatory Visit (HOSPITAL_COMMUNITY)
Admission: RE | Admit: 2014-04-08 | Discharge: 2014-04-08 | Disposition: A | Discharge status: Home / Self Care | Payer: Medicare Other | Source: Ambulatory Visit | Attending: Radiology | Admitting: Radiology

## 2014-04-08 ENCOUNTER — Telehealth: Payer: Self-pay | Admitting: Internal Medicine

## 2014-04-08 ENCOUNTER — Ambulatory Visit (HOSPITAL_COMMUNITY)
Admission: RE | Admit: 2014-04-08 | Discharge: 2014-04-08 | Disposition: A | Discharge status: Home / Self Care | Payer: Medicare Other | Source: Ambulatory Visit | Attending: Internal Medicine | Admitting: Internal Medicine

## 2014-04-08 ENCOUNTER — Ambulatory Visit: Payer: Medicare Other

## 2014-04-08 VITALS — BP 138/87 | HR 93 | Temp 98.2°F | Resp 20 | Ht 69.0 in | Wt 179.7 lb

## 2014-04-08 DIAGNOSIS — R05 Cough: Secondary | ICD-10-CM

## 2014-04-08 DIAGNOSIS — R599 Enlarged lymph nodes, unspecified: Secondary | ICD-10-CM

## 2014-04-08 DIAGNOSIS — R0989 Other specified symptoms and signs involving the circulatory and respiratory systems: Secondary | ICD-10-CM

## 2014-04-08 DIAGNOSIS — C3410 Malignant neoplasm of upper lobe, unspecified bronchus or lung: Secondary | ICD-10-CM

## 2014-04-08 DIAGNOSIS — K7689 Other specified diseases of liver: Secondary | ICD-10-CM

## 2014-04-08 DIAGNOSIS — J90 Pleural effusion, not elsewhere classified: Secondary | ICD-10-CM | POA: Insufficient documentation

## 2014-04-08 DIAGNOSIS — R059 Cough, unspecified: Secondary | ICD-10-CM

## 2014-04-08 DIAGNOSIS — C3490 Malignant neoplasm of unspecified part of unspecified bronchus or lung: Secondary | ICD-10-CM | POA: Insufficient documentation

## 2014-04-08 DIAGNOSIS — R0609 Other forms of dyspnea: Secondary | ICD-10-CM

## 2014-04-08 DIAGNOSIS — R918 Other nonspecific abnormal finding of lung field: Secondary | ICD-10-CM | POA: Insufficient documentation

## 2014-04-08 MED ORDER — FOLIC ACID 1 MG PO TABS: 1.0000 mg | ORAL_TABLET | Freq: Every day | ORAL | Status: DC

## 2014-04-08 MED ORDER — DEXAMETHASONE 4 MG PO TABS: ORAL_TABLET | ORAL | Status: DC

## 2014-04-08 MED ORDER — CYANOCOBALAMIN 1000 MCG/ML IJ SOLN
1000.0000 ug | Freq: Once | INTRAMUSCULAR | Status: AC
  Administered 2014-04-08: 1000 ug via INTRAMUSCULAR

## 2014-04-08 MED ORDER — PROCHLORPERAZINE MALEATE 10 MG PO TABS: 10.0000 mg | ORAL_TABLET | Freq: Four times a day (QID) | ORAL | Status: DC | PRN

## 2014-04-08 NOTE — Procedures (Signed)
Successful US guided left thoracentesis. Yielded 1.7L of blood tinged pleural fluid. Pt tolerated procedure well. No immediate complications.  Specimen was not sent for labs. CXR ordered.  Ascencion Dike PA-C 04/08/2014 12:02 PM

## 2014-04-08 NOTE — Telephone Encounter (Signed)
gv adn printed appts ched and avs for pt fro April and May....sed added tx

## 2014-04-08 NOTE — Progress Notes (Signed)
Put daughter's fmla form on nurse's desk.

## 2014-04-08 NOTE — Progress Notes (Signed)
Shawn Espinoza [phone]   Fax:([phone]) [place], [place] [address]  DIAGNOSIS: Stage IIIB/IV non-small cell lung cancer, adenocarcinoma diagnosed in March of 2015.  PRIOR THERAPY: None.  CURRENT THERAPY: systemic chemotherapy with carboplatin for AUC of 5 and Alimta 500 mg/M2 every 3 weeks. First dose expected on 04/13/2014  INTERVAL HISTORY: AISON MALVEAUX 73 y.o. male returns to the clinic today for follow up visit accompanied by his wife. The patient is feeling fine today with no specific complaints except for the shortness of breath at baseline and increased with exertion. He denied having any significant chest pain but continues to have mild cough with no hemoptysis. He has no significant fever or chills, no nausea or vomiting. He had several studies performed recently including MRI of the brain in addition to a PET scan. His tumor tissues were sent for molecular biomarker testing but the results are still pending. The patient is here today for evaluation and discussion of his treatment options.  MEDICAL HISTORY: Past Medical History  Diagnosis Date  . History of diabetes mellitus, type II     resolved with diet, h/o neuropathy  . Diverticulosis of colon   . Hyperlipidemia   . Hypertension   . Fatty liver 02/29/00    abd ultrasound  . Lower back pain   . Systolic murmur 4259    2Decho - normal LV fxn, EF 55%, mild AS, biatrial enlargement  . History of tobacco use quit 1990s  . 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 Personal history of colonic adenomas 07/06/2013    ALLERGIES:  is allergic to candesartan cilexetil; diltiazem hcl; doxazosin mesylate; nifedipine; pravastatin; red yeast rice; rosuvastatin; and simvastatin.  MEDICATIONS:  Current Outpatient Prescriptions  Medication Sig  Dispense Refill  . amLODipine (NORVASC) 5 MG tablet Take 1 tablet (5 mg total) by mouth daily.  90 tablet  1  . atenolol (TENORMIN) 25 MG tablet Take 1 tablet (25 mg total) by mouth 2 (two) times daily.  180 tablet  3  . cholecalciferol (VITAMIN D) 1000 UNITS tablet Take 1,000 Units by mouth daily.      . Digestive Enzymes (PAPAYA AND ENZYMES PO) Take by mouth as needed.        Marland Kitchen glucose blood (ONE TOUCH ULTRA TEST) test strip USE ONE STRIP TO CHECK GLUCOSE ONCE DAILY IN THE MORNING 4 TO 5 DAYS A WEEK Dx:250.00  100 each  3  . guaiFENesin (MUCINEX) 600 MG 12 hr tablet Take by mouth 2 (two) times daily.      Marland Kitchen ibuprofen (ADVIL,MOTRIN) 800 MG tablet Take 1 tablet (800 mg total) by mouth 2 (two) times daily as needed.  40 tablet  1  . Lancets (ONETOUCH ULTRASOFT) lancets Use to check sugar 4-5 times weekly. Dx: 250.00  100 each  3  . LORazepam (ATIVAN) 0.5 MG tablet Take 1 tablet (0.5 mg total) by mouth as directed. Take 0.5mg  30 minutes prior to MRI  1 tablet  0  . methocarbamol (ROBAXIN) 500 MG tablet Take 500 mg by mouth every 8 (eight) hours as needed.      . Milk Thistle 200 MG CAPS Take 1 capsule by mouth 2 (two) times daily.      . Multiple Vitamin (MULTIVITAMIN) tablet Take 1 tablet by mouth  daily.        . Multiple Vitamins-Minerals (EYE VITAMINS PO) 2 (two) times daily. Due to macular degeneration      . Polyethylene Glycol 3350 (MIRALAX PO) Take by mouth as directed. Takes 1/2 dose PRN      . vitamin C (ASCORBIC ACID) 500 MG tablet Take 250 mg by mouth daily.        No current facility-administered medications for this visit.    SURGICAL HISTORY:  Past Surgical History  Procedure Laterality Date  . Colonoscopy  2004  . Colonoscopy  2014    tubular adenoma x1, mod diverticulosis ([facility])  . Flexible bronchoscopy  02/2014    WNL  . Video bronchoscopy Bilateral 03/17/2014    Procedure: VIDEO BRONCHOSCOPY WITH FLUORO;  Surgeon: Tanda Rockers, MD;  Location: [facility];  Service:  Cardiopulmonary;  Laterality: Bilateral;    REVIEW OF SYSTEMS:  Constitutional: positive for fatigue Eyes: negative Ears, nose, mouth, throat, and face: negative Respiratory: positive for cough and dyspnea on exertion Cardiovascular: negative Gastrointestinal: negative Genitourinary:negative Integument/breast: negative Hematologic/lymphatic: negative Musculoskeletal:negative Neurological: negative Behavioral/Psych: negative Endocrine: negative Allergic/Immunologic: negative   PHYSICAL EXAMINATION: General appearance: alert, cooperative, fatigued and no distress Head: Normocephalic, without obvious abnormality, atraumatic Neck: no adenopathy, no JVD, supple, symmetrical, trachea midline and thyroid not enlarged, symmetric, no tenderness/mass/nodules Lymph nodes: Cervical, supraclavicular, and axillary nodes normal. Resp: diminished breath sounds LLL and dullness to percussion LLL Back: symmetric, no curvature. ROM normal. No CVA tenderness. Cardio: regular rate and rhythm, S1, S2 normal, no murmur, click, rub or gallop GI: soft, non-tender; bowel sounds normal; no masses,  no organomegaly Extremities: extremities normal, atraumatic, no cyanosis or edema Neurologic: Alert and oriented X 3, normal strength and tone. Normal symmetric reflexes. Normal coordination and gait  ECOG PERFORMANCE STATUS: 1 - Symptomatic but completely ambulatory  There were no vitals taken for this visit.  LABORATORY DATA: Lab Results  Component Value Date   WBC 7.6 03/24/2014   HGB 17.0 03/24/2014   HCT 50.8* 03/24/2014   MCV 93.5 03/24/2014   PLT 163 03/24/2014      Chemistry      Component Value Date/Time   NA 142 03/24/2014 1522   NA 141 03/09/2014 0834   K 4.2 03/24/2014 1522   K 4.5 03/09/2014 0834   CL 104 03/09/2014 0834   CO2 23 03/24/2014 1522   CO2 30 03/09/2014 0834   BUN 16.7 03/24/2014 1522   BUN 15 03/09/2014 0834   CREATININE 0.7 03/24/2014 1522   CREATININE 0.8 03/09/2014 0834   CREATININE 0.83  02/22/2014 1314      Component Value Date/Time   CALCIUM 9.2 03/24/2014 1522   CALCIUM 8.7 03/09/2014 0834   ALKPHOS 69 03/24/2014 1522   ALKPHOS 55 03/09/2014 0834   AST 16 03/24/2014 1522   AST 20 03/09/2014 0834   ALT 15 03/24/2014 1522   ALT 21 03/09/2014 0834   BILITOT 0.91 03/24/2014 1522   BILITOT 1.2 03/09/2014 0834       RADIOGRAPHIC STUDIES: Dg Eye Foreign Body  04/01/2014   CLINICAL DATA:  Metal working/exposure; clearance prior to MRI  EXAM: ORBITS FOR FOREIGN BODY - 2 VIEW  COMPARISON:  None.  FINDINGS: There is no evidence of metallic foreign body within the orbits. No significant bone abnormality identified.  IMPRESSION: No evidence of metallic foreign body within the orbits.   Electronically Signed   By: Rozetta Nunnery M.D.   On: 04/01/2014 11:23   Dg Chest  2 View  03/17/2014   CLINICAL DATA:  Abnormal chest CT.  Cough.  Pre bronchoscopy.  EXAM: CHEST  2 VIEW  COMPARISON:  CT chest February 23, 2014  FINDINGS: The heart size and mediastinal contours are within normal limits. There is diffuse increased pulmonary interstitium and ground-glass opacity throughout the left upper and left lower lobes. There is minimal left pleural effusion. The right lung is clear. Degenerative joint changes of the spine are noted.  IMPRESSION: Diffuse increased pulmonary interstitium and ground-glass opacity throughout the left upper and lower lobes which were seen on prior CT. Minimal left pleural effusion.   Electronically Signed   By: Abelardo Diesel M.D.   On: 03/17/2014 08:25   Mr Jeri Cos NF Contrast  04/01/2014   CLINICAL DATA:  Adenocarcinoma of lung. Evaluate for intracranial metastatic disease.  EXAM: MRI HEAD WITHOUT AND WITH CONTRAST  TECHNIQUE: Multiplanar, multiecho pulse sequences of the brain and surrounding structures were obtained without and with intravenous contrast.  CONTRAST:  34mL MULTIHANCE GADOBENATE DIMEGLUMINE 529 MG/ML IV SOLN  COMPARISON:  PET scan performed 03/30/2014.  FINDINGS: No evidence  for acute infarction, hemorrhage, mass lesion, hydrocephalus, or extra-axial fluid. Mild to moderate cerebral and cerebellar atrophy. Prominent perivascular spaces. Chronic microvascular ischemic change of a moderate degree in the periventricular and subcortical white matter. 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  Post infusion, no abnormal enhancement of the brain or meninges. Pituitary, pineal, and cerebellar tonsils unremarkable. No upper cervical lesions. Flow voids are maintained throughout the carotid, basilar, and vertebral arteries. There are no areas of chronic hemorrhage. Visualized calvarium, skull base, and upper cervical osseous structures unremarkable. Scalp and extracranial soft tissues, orbits, sinuses, and mastoids show no acute process.  IMPRESSION: Atrophy, small vessel disease, and remote lacunar infarcts. No acute intracranial findings.  No visible intracranial metastatic disease.   Electronically Signed   By: Rolla Flatten M.D.   On: 04/01/2014 15:51   Nm Pet Image Initial (pi) Skull Base To Thigh  03/30/2014   CLINICAL DATA:  Initial treatment strategy for Lung cancer.  EXAM: NUCLEAR MEDICINE PET SKULL BASE TO THIGH  TECHNIQUE: 9.6 mCi F-18 FDG was injected intravenously. Full-ring PET imaging was performed from the skull base to thigh after the radiotracer. CT data was obtained and used for attenuation correction and anatomic localization.  FASTING BLOOD GLUCOSE:  Value: 141 mg/dl  COMPARISON:  CT 02/23/2014.  Marland Kitchen  FINDINGS: NECK  Hypermetabolic left level 4 and level 5 lymph nodes are identified. Index lymph node within the left level 4 region has an SUV max equal to 3.5. Also in the left level 4 region there is a hypermetabolic lymph node with an SUV max equal to 3.8.  CHEST  The heart size is normal. There is no pericardial effusion. Multiple hypermetabolic mediastinal and bilateral hilar lymph nodes are identified. Index high right paratracheal lymph node has  an SUV max equal to 5.8. Hypermetabolic sub- carinal lymph node has an SUV max equal to 6.3. The right hilar lymph node has an SUV max equal to 3.9. The left hilar lymph node has an SUV max equal to 6.3.  Mild changes of centrilobular emphysema noted. There is a small nodule within the right lower lobe which measures 6 mm, image 89. This is too small to characterize by PET-CT. Several tiny nodules within the right upper lobe are better seen on the diagnostic chest CT from 02/23/2014. These are also too small to characterize by PET-CT.  There is a moderate volume left pleural effusion. Pulmonary nodule within the left lower lobe has an SUV max equal 5.1. There is diffuse interlobular septal thickening within the left upper lobe and left lower lobe which concerning for lymphangitic spread of tumor.  ABDOMEN/PELVIS  4.3 cm mass within the right hepatic lobe is noted. There is a minimal increase in activity associated with this mass above background activity. The SUV max is equal to 3.3 (versus 2.9 background). No abnormal hypermetabolic activity within the liver, pancreas, adrenal glands, or spleen. No hypermetabolic lymph nodes in the abdomen or pelvis.  SKELETON  No focal hypermetabolic activity to suggest skeletal metastasis.  IMPRESSION: 1. Findings compatible with primary bronchogenic carcinoma involving the left lung. There is evidence of lymphangitic spread throughout the left lung. 2. Hypermetabolic bilateral hilar and bilateral mediastinal lymph nodes. There also hypermetabolic left-sided level 5 and level 4 cervical lymph nodes worrisome for metastatic adenopathy. 3. Left pleural effusion. 4. Right hepatic lobe mass is indeterminate. Only mild increased tracer uptake is identified within this mass above background activity. Because of the obvious staging implications, consider further evaluation with percutaneous biopsy.   Electronically Signed   By: Kerby Moors M.D.   On: 03/30/2014 10:32   Dg Chest Port  1 View  03/17/2014   CLINICAL DATA:  Left upper lobe bronchoscopy and biopsies.  EXAM: PORTABLE CHEST - 1 VIEW  COMPARISON:  03/17/2014 at 7:03 a.m.  FINDINGS: Normal heart size and mediastinal contours. Increased opacification in the left lung diffusely, but most dense in the upper lobe. There is a left pleural effusion which is small and without evidence of increase. No pneumothorax. Clear right lung.  IMPRESSION: 1. Increased airspace opacification on the left, which could represent alveolar hemorrhage or bronchoscopy lavage. 2. No pneumothorax.   Electronically Signed   By: Jorje Guild M.D.   On: 03/17/2014 08:59   Dg C-arm Bronchoscopy  03/17/2014   CLINICAL DATA: bronch procedure   C-ARM BRONCHOSCOPY  Fluoroscopy was utilized by the requesting physician.  No radiographic  interpretation.     ASSESSMENT AND PLAN: this is a very pleasant 73 years old white male with stage IV non-small cell lung cancer, adenocarcinoma presenting with large right upper lobe lung mass in addition to mediastinal and bilateral hilar lymphadenopathy as well as cervical lymphadenopathy and left pleural effusion. The molecular biomarker result is still pending. His MRI of the brain showed no evidence for metastatic disease to the brain and PET scan showed the liver lesion still indeterminate but less likely to be malignant. I have a lengthy discussion with the patient and his wife today about his condition and it showed them the images of the PET scan. I gave the patient the option of waiting for the results of the biomarker testing versus proceeding with systemic chemotherapy for now until the results become available. He would like to proceed with systemic chemotherapy. I recommended for the patient in regimen consisting of carboplatin for AUC of 5 and Alimta 500 mg/M2 every 3 weeks. I discussed with the patient adverse effect of the chemotherapy including but not limited to alopecia, myelosuppression, nausea and  vomiting, peripheral neuropathy, liver or renal dysfunction. He would have a chemotherapy education class before starting the first cycle of this treatment. The patient will receive vitamin B12 injection today. I would also E scribe Compazine 10 mg by mouth every 6 hours as needed for nausea, folic acid 1 mg by mouth daily in addition to Decadron 4  mg by mouth twice a day the day before, day of and day after the chemotherapy every 3 weeks. He is expected to start the first cycle of systemic chemotherapy on 04/13/2014. The patient would come back for follow up visit in 2 weeks for reevaluation and management any adverse effect of his treatment. For the left pleural effusion and dyspnea, I will arrange for the patient to have ultrasound-guided left thoracentesis today. He was advised to call immediately if he has any concerning symptoms in the interval.  The patient voices understanding of current disease status and treatment options and is in agreement with the current care plan.  All questions were answered. The patient knows to call the clinic with any problems, questions or concerns. We can certainly see the patient much sooner if necessary.  I spent 20 minutes counseling the patient face to face. The total time spent in the appointment was 30 minutes.  Disclaimer: This note was dictated with voice recognition software. Similar sounding words can inadvertently be transcribed and may not be corrected upon review.

## 2014-04-11 ENCOUNTER — Telehealth: Payer: Self-pay | Admitting: *Deleted

## 2014-04-11 NOTE — Telephone Encounter (Signed)
Received vm asking about taking nausea meds before coming in for chemo 04/13/14.  Called and spoke with pt's wife and instructed her pt does not need to take any nausea meds prior to first chemo. Pt is scheduled for chemo edu class 4/22 @ 10 am and pt/family will be give information regarding first treatment.  Pt's wife emotional "this is all new to [place]" ; emotional support given.   Informed her pt will be getting anti-nausea med through his IV before getting chemo that day.  Pt's wife verbalized understanding and expressed appreciation for call back.

## 2014-04-12 ENCOUNTER — Encounter: Payer: Self-pay | Admitting: Internal Medicine

## 2014-04-12 ENCOUNTER — Ambulatory Visit: Payer: Medicare Other | Admitting: Internal Medicine

## 2014-04-12 NOTE — Progress Notes (Signed)
Put fmla form in registration desk °

## 2014-04-13 ENCOUNTER — Ambulatory Visit (HOSPITAL_BASED_OUTPATIENT_CLINIC_OR_DEPARTMENT_OTHER): Payer: Medicare Other

## 2014-04-13 ENCOUNTER — Other Ambulatory Visit (HOSPITAL_BASED_OUTPATIENT_CLINIC_OR_DEPARTMENT_OTHER): Payer: Medicare Other

## 2014-04-13 ENCOUNTER — Other Ambulatory Visit: Payer: Medicare Other

## 2014-04-13 ENCOUNTER — Encounter: Payer: Self-pay | Admitting: *Deleted

## 2014-04-13 VITALS — BP 143/69 | HR 63 | Temp 98.3°F | Resp 18

## 2014-04-13 DIAGNOSIS — Z5111 Encounter for antineoplastic chemotherapy: Secondary | ICD-10-CM

## 2014-04-13 DIAGNOSIS — C3490 Malignant neoplasm of unspecified part of unspecified bronchus or lung: Secondary | ICD-10-CM

## 2014-04-13 DIAGNOSIS — C3410 Malignant neoplasm of upper lobe, unspecified bronchus or lung: Secondary | ICD-10-CM

## 2014-04-13 LAB — CBC WITH DIFFERENTIAL/PLATELET
BASO%: 0.2 % (ref 0.0–2.0)
Basophils Absolute: 0 10*3/uL (ref 0.0–0.1)
EOS%: 0 % (ref 0.0–7.0)
Eosinophils Absolute: 0 10*3/uL (ref 0.0–0.5)
HEMATOCRIT: 49 % (ref 38.4–49.9)
HGB: 16.6 g/dL (ref 13.0–17.1)
LYMPH#: 0.4 10*3/uL — AB (ref 0.9–3.3)
LYMPH%: 3.4 % — ABNORMAL LOW (ref 14.0–49.0)
MCH: 31.8 pg (ref 27.2–33.4)
MCHC: 34 g/dL (ref 32.0–36.0)
MCV: 93.7 fL (ref 79.3–98.0)
MONO#: 0.4 10*3/uL (ref 0.1–0.9)
MONO%: 2.9 % (ref 0.0–14.0)
NEUT#: 12.1 10*3/uL — ABNORMAL HIGH (ref 1.5–6.5)
NEUT%: 93.5 % — AB (ref 39.0–75.0)
Platelets: 212 10*3/uL (ref 140–400)
RBC: 5.23 10*6/uL (ref 4.20–5.82)
RDW: 12.9 % (ref 11.0–14.6)
WBC: 12.9 10*3/uL — AB (ref 4.0–10.3)

## 2014-04-13 LAB — COMPREHENSIVE METABOLIC PANEL (CC13)
ALT: 17 U/L (ref 0–55)
AST: 15 U/L (ref 5–34)
Albumin: 3.6 g/dL (ref 3.5–5.0)
Alkaline Phosphatase: 68 U/L (ref 40–150)
Anion Gap: 8 mEq/L (ref 3–11)
BUN: 21.6 mg/dL (ref 7.0–26.0)
CALCIUM: 9.5 mg/dL (ref 8.4–10.4)
CHLORIDE: 106 meq/L (ref 98–109)
CO2: 24 mEq/L (ref 22–29)
Creatinine: 0.7 mg/dL (ref 0.7–1.3)
Glucose: 177 mg/dl — ABNORMAL HIGH (ref 70–140)
Potassium: 4.3 mEq/L (ref 3.5–5.1)
SODIUM: 137 meq/L (ref 136–145)
TOTAL PROTEIN: 6.5 g/dL (ref 6.4–8.3)
Total Bilirubin: 0.53 mg/dL (ref 0.20–1.20)

## 2014-04-13 MED ORDER — SODIUM CHLORIDE 0.9 % IV SOLN
500.0000 mg/m2 | Freq: Once | INTRAVENOUS | Status: AC
  Administered 2014-04-13: 1000 mg via INTRAVENOUS
  Filled 2014-04-13: qty 40

## 2014-04-13 MED ORDER — ONDANSETRON 16 MG/50ML IVPB (CHCC)
16.0000 mg | Freq: Once | INTRAVENOUS | Status: AC
  Administered 2014-04-13: 16 mg via INTRAVENOUS

## 2014-04-13 MED ORDER — ONDANSETRON 16 MG/50ML IVPB (CHCC)
INTRAVENOUS | Status: AC
  Filled 2014-04-13: qty 16

## 2014-04-13 MED ORDER — DEXAMETHASONE SODIUM PHOSPHATE 20 MG/5ML IJ SOLN
20.0000 mg | Freq: Once | INTRAMUSCULAR | Status: AC
  Administered 2014-04-13: 20 mg via INTRAVENOUS

## 2014-04-13 MED ORDER — SODIUM CHLORIDE 0.9 % IV SOLN
504.0000 mg | Freq: Once | INTRAVENOUS | Status: AC
  Administered 2014-04-13: 500 mg via INTRAVENOUS
  Filled 2014-04-13: qty 50

## 2014-04-13 MED ORDER — SODIUM CHLORIDE 0.9 % IV SOLN
Freq: Once | INTRAVENOUS | Status: AC
  Administered 2014-04-13: 12:00:00 via INTRAVENOUS

## 2014-04-13 MED ORDER — DEXAMETHASONE SODIUM PHOSPHATE 20 MG/5ML IJ SOLN
INTRAMUSCULAR | Status: AC
  Filled 2014-04-13: qty 5

## 2014-04-13 NOTE — Patient Instructions (Signed)
[facility] Discharge Instructions for Patients Receiving Chemotherapy  Today you received the following chemotherapy agents Alimta/Carboplatin To help prevent nausea and vomiting after your treatment, we encourage you to take your nausea medication as prescribed. If you develop nausea and vomiting that is not controlled by your nausea medication, call the clinic.   BELOW ARE SYMPTOMS THAT SHOULD BE REPORTED IMMEDIATELY:  *FEVER GREATER THAN 100.5 F  *CHILLS WITH OR WITHOUT FEVER  NAUSEA AND VOMITING THAT IS NOT CONTROLLED WITH YOUR NAUSEA MEDICATION  *UNUSUAL SHORTNESS OF BREATH  *UNUSUAL BRUISING OR BLEEDING  TENDERNESS IN MOUTH AND THROAT WITH OR WITHOUT PRESENCE OF ULCERS  *URINARY PROBLEMS  *BOWEL PROBLEMS  UNUSUAL RASH Items with * indicate a potential emergency and should be followed up as soon as possible.  Feel free to call the clinic you have any questions or concerns. The clinic phone number is [phone].   Pemetrexed injection (Alimta) What is this medicine? PEMETREXED (PEM e TREX ed) is a chemotherapy drug. This medicine affects cells that are rapidly growing, such as cancer cells and cells in your mouth and stomach. It is usually used to treat lung cancers like non-small cell lung cancer and mesothelioma. It may also be used to treat other cancers. This medicine may be used for other purposes; ask your health care provider or pharmacist if you have questions. COMMON BRAND NAME(S): Alimta What should I tell my health care provider before I take this medicine? They need to know if you have any of these conditions: -if you frequently drink alcohol containing beverages -infection (especially a virus infection such as chickenpox, cold sores, or herpes) -kidney disease -liver disease -low blood counts, like low platelets, red bloods, or white blood cells -an unusual or allergic reaction to pemetrexed, mannitol, other medicines, foods, dyes, or  preservatives -pregnant or trying to get pregnant -breast-feeding How should I use this medicine? This drug is given as an infusion into a vein. It is administered in a hospital or clinic by a specially trained health care professional. Talk to your pediatrician regarding the use of this medicine in children. Special care may be needed. Overdosage: If you think you have taken too much of this medicine contact a poison control center or emergency room at once. NOTE: This medicine is only for you. Do not share this medicine with others. What if I miss a dose? It is important not to miss your dose. Call your doctor or health care professional if you are unable to keep an appointment. What may interact with this medicine? -aspirin and aspirin-like medicines -medicines to increase blood counts like filgrastim, pegfilgrastim, sargramostim -methotrexate -NSAIDS, medicines for pain and inflammation, like ibuprofen or naproxen -probenecid -pyrimethamine -vaccines Talk to your doctor or health care professional before taking any of these medicines: -acetaminophen -aspirin -ibuprofen -ketoprofen -naproxen This list may not describe all possible interactions. Give your health care provider a list of all the medicines, herbs, non-prescription drugs, or dietary supplements you use. Also tell them if you smoke, drink alcohol, or use illegal drugs. Some items may interact with your medicine. What should I watch for while using this medicine? Visit your doctor for checks on your progress. This drug may make you feel generally unwell. This is not uncommon, as chemotherapy can affect healthy cells as well as cancer cells. Report any side effects. Continue your course of treatment even though you feel ill unless your doctor tells you to stop. In some cases, you may be given  additional medicines to help with side effects. Follow all directions for their use. Call your doctor or health care professional for  advice if you get a fever, chills or sore throat, or other symptoms of a cold or flu. Do not treat yourself. This drug decreases your body's ability to fight infections. Try to avoid being around people who are sick. This medicine may increase your risk to bruise or bleed. Call your doctor or health care professional if you notice any unusual bleeding. Be careful brushing and flossing your teeth or using a toothpick because you may get an infection or bleed more easily. If you have any dental work done, tell your dentist you are receiving this medicine. Avoid taking products that contain aspirin, acetaminophen, ibuprofen, naproxen, or ketoprofen unless instructed by your doctor. These medicines may hide a fever. Call your doctor or health care professional if you get diarrhea or mouth sores. Do not treat yourself. To protect your kidneys, drink water or other fluids as directed while you are taking this medicine. Men and women must use effective birth control while taking this medicine. You may also need to continue using effective birth control for a time after stopping this medicine. Do not become pregnant while taking this medicine. Tell your doctor right away if you think that you or your partner might be pregnant. There is a potential for serious side effects to an unborn child. Talk to your health care professional or pharmacist for more information. Do not breast-feed an infant while taking this medicine. This medicine may lower sperm counts. What side effects may I notice from receiving this medicine? Side effects that you should report to your doctor or health care professional as soon as possible: -allergic reactions like skin rash, itching or hives, swelling of the face, lips, or tongue -low blood counts - this medicine may decrease the number of white blood cells, red blood cells and platelets. You may be at increased risk for infections and bleeding. -signs of infection - fever or chills,  cough, sore throat, pain or difficulty passing urine -signs of decreased platelets or bleeding - bruising, pinpoint red spots on the skin, black, tarry stools, blood in the urine -signs of decreased red blood cells - unusually weak or tired, fainting spells, lightheadedness -breathing problems, like a dry cough -changes in emotions or moods -chest pain -confusion -diarrhea -high blood pressure -mouth or throat sores or ulcers -pain, swelling, warmth in the leg -pain on swallowing -swelling of the ankles, feet, hands -trouble passing urine or change in the amount of urine -vomiting -yellowing of the eyes or skin Side effects that usually do not require medical attention (report to your doctor or health care professional if they continue or are bothersome): -hair loss -loss of appetite -nausea -stomach upset This list may not describe all possible side effects. Call your doctor for medical advice about side effects. You may report side effects to FDA at 1-800-FDA-1088. Where should I keep my medicine? This drug is given in a hospital or clinic and will not be stored at home. NOTE: This sheet is a summary. It may not cover all possible information. If you have questions about this medicine, talk to your doctor, pharmacist, or health care provider.  2014, [employer]. (2008-07-12 13:24:03)   Carboplatin injection What is this medicine? CARBOPLATIN (KAR boe pla tin) is a chemotherapy drug. It targets fast dividing cells, like cancer cells, and causes these cells to die. This medicine is used to treat ovarian  cancer and many other cancers. This medicine may be used for other purposes; ask your health care provider or pharmacist if you have questions. COMMON BRAND NAME(S): Paraplatin What should I tell my health care provider before I take this medicine? They need to know if you have any of these conditions: -blood disorders -hearing problems -kidney disease -recent or  ongoing radiation therapy -an unusual or allergic reaction to carboplatin, cisplatin, other chemotherapy, other medicines, foods, dyes, or preservatives -pregnant or trying to get pregnant -breast-feeding How should I use this medicine? This drug is usually given as an infusion into a vein. It is administered in a hospital or clinic by a specially trained health care professional. Talk to your pediatrician regarding the use of this medicine in children. Special care may be needed. Overdosage: If you think you have taken too much of this medicine contact a poison control center or emergency room at once. NOTE: This medicine is only for you. Do not share this medicine with others. What if I miss a dose? It is important not to miss a dose. Call your doctor or health care professional if you are unable to keep an appointment. What may interact with this medicine? -medicines for seizures -medicines to increase blood counts like filgrastim, pegfilgrastim, sargramostim -some antibiotics like amikacin, gentamicin, neomycin, streptomycin, tobramycin -vaccines Talk to your doctor or health care professional before taking any of these medicines: -acetaminophen -aspirin -ibuprofen -ketoprofen -naproxen This list may not describe all possible interactions. Give your health care provider a list of all the medicines, herbs, non-prescription drugs, or dietary supplements you use. Also tell them if you smoke, drink alcohol, or use illegal drugs. Some items may interact with your medicine. What should I watch for while using this medicine? Your condition will be monitored carefully while you are receiving this medicine. You will need important blood work done while you are taking this medicine. This drug may make you feel generally unwell. This is not uncommon, as chemotherapy can affect healthy cells as well as cancer cells. Report any side effects. Continue your course of treatment even though you feel ill  unless your doctor tells you to stop. In some cases, you may be given additional medicines to help with side effects. Follow all directions for their use. Call your doctor or health care professional for advice if you get a fever, chills or sore throat, or other symptoms of a cold or flu. Do not treat yourself. This drug decreases your body's ability to fight infections. Try to avoid being around people who are sick. This medicine may increase your risk to bruise or bleed. Call your doctor or health care professional if you notice any unusual bleeding. Be careful brushing and flossing your teeth or using a toothpick because you may get an infection or bleed more easily. If you have any dental work done, tell your dentist you are receiving this medicine. Avoid taking products that contain aspirin, acetaminophen, ibuprofen, naproxen, or ketoprofen unless instructed by your doctor. These medicines may hide a fever. Do not become pregnant while taking this medicine. Women should inform their doctor if they wish to become pregnant or think they might be pregnant. There is a potential for serious side effects to an unborn child. Talk to your health care professional or pharmacist for more information. Do not breast-feed an infant while taking this medicine. What side effects may I notice from receiving this medicine? Side effects that you should report to your doctor or health care  professional as soon as possible: -allergic reactions like skin rash, itching or hives, swelling of the face, lips, or tongue -signs of infection - fever or chills, cough, sore throat, pain or difficulty passing urine -signs of decreased platelets or bleeding - bruising, pinpoint red spots on the skin, black, tarry stools, nosebleeds -signs of decreased red blood cells - unusually weak or tired, fainting spells, lightheadedness -breathing problems -changes in hearing -changes in vision -chest pain -high blood pressure -low  blood counts - This drug may decrease the number of white blood cells, red blood cells and platelets. You may be at increased risk for infections and bleeding. -nausea and vomiting -pain, swelling, redness or irritation at the injection site -pain, tingling, numbness in the hands or feet -problems with balance, talking, walking -trouble passing urine or change in the amount of urine Side effects that usually do not require medical attention (report to your doctor or health care professional if they continue or are bothersome): -hair loss -loss of appetite -metallic taste in the mouth or changes in taste This list may not describe all possible side effects. Call your doctor for medical advice about side effects. You may report side effects to FDA at 1-800-FDA-1088. Where should I keep my medicine? This drug is given in a hospital or clinic and will not be stored at home. NOTE: This sheet is a summary. It may not cover all possible information. If you have questions about this medicine, talk to your doctor, pharmacist, or health care provider.  2014, [employer]. (2008-03-15 14:38:05)

## 2014-04-14 ENCOUNTER — Telehealth: Payer: Self-pay | Admitting: *Deleted

## 2014-04-14 LAB — FUNGUS CULTURE W SMEAR
Fungal Smear: NONE SEEN
Special Requests: NORMAL

## 2014-04-14 NOTE — Telephone Encounter (Signed)
Called pt on cell phone and left message for pt to call triage nurse back for chemo follow up call.

## 2014-04-15 ENCOUNTER — Telehealth: Payer: Self-pay | Admitting: *Deleted

## 2014-04-15 ENCOUNTER — Other Ambulatory Visit: Payer: Self-pay | Admitting: *Deleted

## 2014-04-15 MED ORDER — ATENOLOL 25 MG PO TABS: 25.0000 mg | ORAL_TABLET | Freq: Two times a day (BID) | ORAL | Status: DC

## 2014-04-15 MED ORDER — AMLODIPINE BESYLATE 5 MG PO TABS: 5.0000 mg | ORAL_TABLET | Freq: Every day | ORAL | Status: DC

## 2014-04-15 NOTE — Telephone Encounter (Signed)
Received call from wife Perrin Smack for chemo follow up call.  Per [place], pt is doing well.  Good appetite, and drinking lots of fluids as tolerated.  Denied nausea/vomiting; denied pain.  Bladder functions fine.  Has problem with constipation.  Took Senna and Colace with results.  Reinforced varieties of dietary intake to help with bowel along with taking Senokot-S.  Wife stated pt had taken Miralax in the past.  Reinforced need to increase po fluids with wife.  Wife voiced understanding.

## 2014-04-20 ENCOUNTER — Telehealth: Payer: Self-pay | Admitting: Internal Medicine

## 2014-04-20 ENCOUNTER — Other Ambulatory Visit (HOSPITAL_BASED_OUTPATIENT_CLINIC_OR_DEPARTMENT_OTHER): Payer: Medicare Other

## 2014-04-20 ENCOUNTER — Ambulatory Visit (HOSPITAL_BASED_OUTPATIENT_CLINIC_OR_DEPARTMENT_OTHER): Payer: Medicare Other | Admitting: Physician Assistant

## 2014-04-20 ENCOUNTER — Encounter: Payer: Self-pay | Admitting: Physician Assistant

## 2014-04-20 ENCOUNTER — Ambulatory Visit (HOSPITAL_BASED_OUTPATIENT_CLINIC_OR_DEPARTMENT_OTHER): Payer: Medicare Other

## 2014-04-20 VITALS — BP 133/85 | HR 66 | Temp 97.4°F | Resp 18 | Ht 69.0 in | Wt 171.3 lb

## 2014-04-20 DIAGNOSIS — C3410 Malignant neoplasm of upper lobe, unspecified bronchus or lung: Secondary | ICD-10-CM

## 2014-04-20 DIAGNOSIS — C3490 Malignant neoplasm of unspecified part of unspecified bronchus or lung: Secondary | ICD-10-CM

## 2014-04-20 DIAGNOSIS — K7689 Other specified diseases of liver: Secondary | ICD-10-CM

## 2014-04-20 DIAGNOSIS — R319 Hematuria, unspecified: Secondary | ICD-10-CM

## 2014-04-20 LAB — URINALYSIS, MICROSCOPIC - CHCC
BILIRUBIN (URINE): NEGATIVE
Blood: NEGATIVE
Glucose: NEGATIVE mg/dL
KETONES: NEGATIVE mg/dL
Leukocyte Esterase: NEGATIVE
Nitrite: NEGATIVE
PH: 6.5 (ref 4.6–8.0)
Protein: NEGATIVE mg/dL
RBC / HPF: NEGATIVE (ref 0–2)
SPECIFIC GRAVITY, URINE: 1.01 (ref 1.003–1.035)
Urobilinogen, UR: 0.2 mg/dL (ref 0.2–1)

## 2014-04-20 LAB — COMPREHENSIVE METABOLIC PANEL (CC13)
ALT: 21 U/L (ref 0–55)
AST: 14 U/L (ref 5–34)
Albumin: 3.1 g/dL — ABNORMAL LOW (ref 3.5–5.0)
Alkaline Phosphatase: 57 U/L (ref 40–150)
Anion Gap: 7 mEq/L (ref 3–11)
BUN: 19 mg/dL (ref 7.0–26.0)
CO2: 28 mEq/L (ref 22–29)
CREATININE: 0.9 mg/dL (ref 0.7–1.3)
Calcium: 8.8 mg/dL (ref 8.4–10.4)
Chloride: 103 mEq/L (ref 98–109)
Glucose: 206 mg/dl — ABNORMAL HIGH (ref 70–140)
Potassium: 4.4 mEq/L (ref 3.5–5.1)
Sodium: 138 mEq/L (ref 136–145)
Total Bilirubin: 0.84 mg/dL (ref 0.20–1.20)
Total Protein: 5.7 g/dL — ABNORMAL LOW (ref 6.4–8.3)

## 2014-04-20 LAB — CBC WITH DIFFERENTIAL/PLATELET
BASO%: 0.9 % (ref 0.0–2.0)
BASOS ABS: 0 10*3/uL (ref 0.0–0.1)
EOS%: 6.5 % (ref 0.0–7.0)
Eosinophils Absolute: 0.1 10*3/uL (ref 0.0–0.5)
HCT: 49 % (ref 38.4–49.9)
HEMOGLOBIN: 17.2 g/dL — AB (ref 13.0–17.1)
LYMPH%: 33 % (ref 14.0–49.0)
MCH: 32 pg (ref 27.2–33.4)
MCHC: 35.1 g/dL (ref 32.0–36.0)
MCV: 91.2 fL (ref 79.3–98.0)
MONO#: 0.2 10*3/uL (ref 0.1–0.9)
MONO%: 10.7 % (ref 0.0–14.0)
NEUT#: 1.1 10*3/uL — ABNORMAL LOW (ref 1.5–6.5)
NEUT%: 48.9 % (ref 39.0–75.0)
Platelets: 104 10*3/uL — ABNORMAL LOW (ref 140–400)
RBC: 5.37 10*6/uL (ref 4.20–5.82)
RDW: 12.5 % (ref 11.0–14.6)
WBC: 2.2 10*3/uL — ABNORMAL LOW (ref 4.0–10.3)
lymph#: 0.7 10*3/uL — ABNORMAL LOW (ref 0.9–3.3)

## 2014-04-20 MED ORDER — FLUCONAZOLE 100 MG PO TABS: 100.0000 mg | ORAL_TABLET | Freq: Every day | ORAL | Status: DC

## 2014-04-20 NOTE — Telephone Encounter (Signed)
gave pt appt for lab,md and chemo for May 2015

## 2014-04-20 NOTE — Progress Notes (Addendum)
[place] [phone]   [phone] [place], [place] [address]  DIAGNOSIS: Stage IIIB/IV non-small cell lung cancer, adenocarcinoma diagnosed in March of 2015. Specimen sent to [facility] for biomarker testing however there was an insufficient material to proceed with testing  PRIOR THERAPY: None.  CURRENT THERAPY: systemic chemotherapy with carboplatin for AUC of 5 and Alimta 500 mg/M2 every 3 weeks. First dose given on 04/13/2014  INTERVAL HISTORY: Shawn Espinoza 73 y.o. male returns to the clinic today for symptom management visit after completing his first cycle of chemotherapy. He is accompanied by his wife. Patient reports he developed worsening constipation and the day after chemotherapy. He normally takes a third to a half a dose of MiraLAX daily. He has a history of internal hemorrhoids. She's had a sensation of feeling cold but no shaking chills and no documented fever. He has lost a little over 8 pounds although his appetite is good. Complains of his mouth being sore. He also noted a few drops of blood in his urine past day or two.  He did have some fatigue and generalized malaise related to the chemotherapy. He voiced no other complaints.He continues to have shortness of breath at baseline and increased with exertion. He denied having any significant chest pain but continues to have mild cough with no hemoptysis.   MEDICAL HISTORY: Past Medical History  Diagnosis Date  . History of diabetes mellitus, type II     resolved with diet, h/o neuropathy  . Diverticulosis of colon   . Hyperlipidemia   . Hypertension   . Fatty liver 02/29/00    abd ultrasound  . Lower back pain   . Systolic murmur 4259    2Decho - normal LV fxn, EF 55%, mild AS, biatrial enlargement  . History of tobacco use quit 1990s  . Positive hepatitis C antibody test 2013    HCV RNA negative -  ?cleared infection  . 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 Personal history of colonic adenomas 07/06/2013    ALLERGIES:  is allergic to candesartan cilexetil; diltiazem hcl; doxazosin mesylate; nifedipine; pravastatin; red yeast rice; rosuvastatin; and simvastatin.  MEDICATIONS:  Current Outpatient Prescriptions  Medication Sig Dispense Refill  . amLODipine (NORVASC) 5 MG tablet Take 1 tablet (5 mg total) by mouth daily.  90 tablet  2  . atenolol (TENORMIN) 25 MG tablet Take 1 tablet (25 mg total) by mouth 2 (two) times daily.  180 tablet  2  . dexamethasone (DECADRON) 4 MG tablet 4 mg by mouth twice a day the day before, day of and day after the chemotherapy every 3 weeks.  40 tablet  1  . Digestive Enzymes (PAPAYA AND ENZYMES PO) Take by mouth as needed.        . folic acid (FOLVITE) 1 MG tablet Take 1 tablet (1 mg total) by mouth daily.  30 tablet  4  . glucose blood (ONE TOUCH ULTRA TEST) test strip USE ONE STRIP TO CHECK GLUCOSE ONCE DAILY IN THE MORNING 4 TO 5 DAYS A WEEK Dx:250.00  100 each  3  . guaiFENesin (MUCINEX) 600 MG 12 hr tablet Take by mouth 2 (two) times daily.      Marland Kitchen ibuprofen (ADVIL,MOTRIN) 800 MG tablet Take 1 tablet (800 mg total) by mouth 2 (two) times daily as needed.  40 tablet  1  . Lancets (ONETOUCH ULTRASOFT)  lancets Use to check sugar 4-5 times weekly. Dx: 250.00  100 each  3  . methocarbamol (ROBAXIN) 500 MG tablet Take 500 mg by mouth every 8 (eight) hours as needed.      . Milk Thistle 200 MG CAPS Take 1 capsule by mouth 2 (two) times daily.      . Multiple Vitamins-Minerals (EYE VITAMINS PO) 2 (two) times daily. Due to macular degeneration      . Polyethylene Glycol 3350 (MIRALAX PO) Take by mouth as directed. Takes 1/2 dose PRN      . prochlorperazine (COMPAZINE) 10 MG tablet Take 1 tablet (10 mg total) by mouth every 6 (six) hours as needed for nausea or vomiting.  60 tablet  0  . cholecalciferol (VITAMIN D) 1000 UNITS tablet Take 1,000 Units by mouth  daily.      Marland Kitchen LORazepam (ATIVAN) 0.5 MG tablet Take 1 tablet (0.5 mg total) by mouth as directed. Take 0.5mg  30 minutes prior to MRI  1 tablet  0  . Multiple Vitamin (MULTIVITAMIN) tablet Take 1 tablet by mouth daily.        . vitamin C (ASCORBIC ACID) 500 MG tablet Take 250 mg by mouth daily.        No current facility-administered medications for this visit.    SURGICAL HISTORY:  Past Surgical History  Procedure Laterality Date  . Colonoscopy  2004  . Colonoscopy  2014    tubular adenoma x1, mod diverticulosis ([facility])  . Flexible bronchoscopy  02/2014    WNL  . Video bronchoscopy Bilateral 03/17/2014    Procedure: VIDEO BRONCHOSCOPY WITH FLUORO;  Surgeon: Tanda Rockers, MD;  Location: [facility];  Service: Cardiopulmonary;  Laterality: Bilateral;    REVIEW OF SYSTEMS:  Constitutional: positive for fatigue, malaise and weight loss Eyes: negative Ears, nose, mouth, throat, and face: positive for sore mouth Respiratory: positive for cough and dyspnea on exertion Cardiovascular: negative Gastrointestinal: positive for constipation Genitourinary:positive for hematuria Integument/breast: negative Hematologic/lymphatic: negative Musculoskeletal:negative Neurological: negative Behavioral/Psych: negative Endocrine: negative Allergic/Immunologic: negative   PHYSICAL EXAMINATION: General appearance: alert, cooperative, fatigued and no distress Head: Normocephalic, without obvious abnormality, atraumatic Neck: no adenopathy, no JVD, supple, symmetrical, trachea midline and thyroid not enlarged, symmetric, no tenderness/mass/nodules Lymph nodes: Cervical, supraclavicular, and axillary nodes normal. Resp: diminished breath sounds LLL and dullness to percussion LLL Back: symmetric, no curvature. ROM normal. No CVA tenderness. Cardio: regular rate and rhythm, S1, S2 normal, no murmur, click, rub or gallop GI: soft, non-tender; bowel sounds normal; no masses,  no  organomegaly Extremities: extremities normal, atraumatic, no cyanosis or edema Neurologic: Alert and oriented X 3, normal strength and tone. Normal symmetric reflexes. Normal coordination and gait Mouth: reveals thrush  ECOG PERFORMANCE STATUS: 1 - Symptomatic but completely ambulatory  Blood pressure 133/85, pulse 66, temperature 97.4 F (36.3 C), temperature source Oral, resp. rate 18, height 5\' 9"  (1.753 m), weight 171 lb 4.8 oz (77.701 kg), SpO2 96.00%.  LABORATORY DATA: Lab Results  Component Value Date   WBC 2.2* 04/20/2014   HGB 17.2* 04/20/2014   HCT 49.0 04/20/2014   MCV 91.2 04/20/2014   PLT 104* 04/20/2014      Chemistry      Component Value Date/Time   NA 138 04/20/2014 0956   NA 141 03/09/2014 0834   K 4.4 04/20/2014 0956   K 4.5 03/09/2014 0834   CL 104 03/09/2014 0834   CO2 28 04/20/2014 0956   CO2 30 03/09/2014 0834   BUN 19.0  04/20/2014 0956   BUN 15 03/09/2014 0834   CREATININE 0.9 04/20/2014 0956   CREATININE 0.8 03/09/2014 0834   CREATININE 0.83 02/22/2014 1314      Component Value Date/Time   CALCIUM 8.8 04/20/2014 0956   CALCIUM 8.7 03/09/2014 0834   ALKPHOS 57 04/20/2014 0956   ALKPHOS 55 03/09/2014 0834   AST 14 04/20/2014 0956   AST 20 03/09/2014 0834   ALT 21 04/20/2014 0956   ALT 21 03/09/2014 0834   BILITOT 0.84 04/20/2014 0956   BILITOT 1.2 03/09/2014 0834       RADIOGRAPHIC STUDIES: Dg Eye Foreign Body  04/01/2014   CLINICAL DATA:  Metal working/exposure; clearance prior to MRI  EXAM: ORBITS FOR FOREIGN BODY - 2 VIEW  COMPARISON:  None.  FINDINGS: There is no evidence of metallic foreign body within the orbits. No significant bone abnormality identified.  IMPRESSION: No evidence of metallic foreign body within the orbits.   Electronically Signed   By: Rozetta Nunnery M.D.   On: 04/01/2014 11:23   Dg Chest 2 View  03/17/2014   CLINICAL DATA:  Abnormal chest CT.  Cough.  Pre bronchoscopy.  EXAM: CHEST  2 VIEW  COMPARISON:  CT chest February 23, 2014  FINDINGS: The  heart size and mediastinal contours are within normal limits. There is diffuse increased pulmonary interstitium and ground-glass opacity throughout the left upper and left lower lobes. There is minimal left pleural effusion. The right lung is clear. Degenerative joint changes of the spine are noted.  IMPRESSION: Diffuse increased pulmonary interstitium and ground-glass opacity throughout the left upper and lower lobes which were seen on prior CT. Minimal left pleural effusion.   Electronically Signed   By: Abelardo Diesel M.D.   On: 03/17/2014 08:25   Mr Jeri Cos ZJ Contrast  04/01/2014   CLINICAL DATA:  Adenocarcinoma of lung. Evaluate for intracranial metastatic disease.  EXAM: MRI HEAD WITHOUT AND WITH CONTRAST  TECHNIQUE: Multiplanar, multiecho pulse sequences of the brain and surrounding structures were obtained without and with intravenous contrast.  CONTRAST:  52mL MULTIHANCE GADOBENATE DIMEGLUMINE 529 MG/ML IV SOLN  COMPARISON:  PET scan performed 03/30/2014.  FINDINGS: No evidence for acute infarction, hemorrhage, mass lesion, hydrocephalus, or extra-axial fluid. Mild to moderate cerebral and cerebellar atrophy. Prominent perivascular spaces. Chronic microvascular ischemic change of a moderate degree in the periventricular and subcortical white matter. 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  Post infusion, no abnormal enhancement of the brain or meninges. Pituitary, pineal, and cerebellar tonsils unremarkable. No upper cervical lesions. Flow voids are maintained throughout the carotid, basilar, and vertebral arteries. There are no areas of chronic hemorrhage. Visualized calvarium, skull base, and upper cervical osseous structures unremarkable. Scalp and extracranial soft tissues, orbits, sinuses, and mastoids show no acute process.  IMPRESSION: Atrophy, small vessel disease, and remote lacunar infarcts. No acute intracranial findings.  No visible intracranial metastatic disease.    Electronically Signed   By: Rolla Flatten M.D.   On: 04/01/2014 15:51   Nm Pet Image Initial (pi) Skull Base To Thigh  03/30/2014   CLINICAL DATA:  Initial treatment strategy for Lung cancer.  EXAM: NUCLEAR MEDICINE PET SKULL BASE TO THIGH  TECHNIQUE: 9.6 mCi F-18 FDG was injected intravenously. Full-ring PET imaging was performed from the skull base to thigh after the radiotracer. CT data was obtained and used for attenuation correction and anatomic localization.  FASTING BLOOD GLUCOSE:  Value: 141 mg/dl  COMPARISON:  CT 02/23/2014.  Marland Kitchen  FINDINGS: NECK  Hypermetabolic left level 4 and level 5 lymph nodes are identified. Index lymph node within the left level 4 region has an SUV max equal to 3.5. Also in the left level 4 region there is a hypermetabolic lymph node with an SUV max equal to 3.8.  CHEST  The heart size is normal. There is no pericardial effusion. Multiple hypermetabolic mediastinal and bilateral hilar lymph nodes are identified. Index high right paratracheal lymph node has an SUV max equal to 5.8. Hypermetabolic sub- carinal lymph node has an SUV max equal to 6.3. The right hilar lymph node has an SUV max equal to 3.9. The left hilar lymph node has an SUV max equal to 6.3.  Mild changes of centrilobular emphysema noted. There is a small nodule within the right lower lobe which measures 6 mm, image 89. This is too small to characterize by PET-CT. Several tiny nodules within the right upper lobe are better seen on the diagnostic chest CT from 02/23/2014. These are also too small to characterize by PET-CT. There is a moderate volume left pleural effusion. Pulmonary nodule within the left lower lobe has an SUV max equal 5.1. There is diffuse interlobular septal thickening within the left upper lobe and left lower lobe which concerning for lymphangitic spread of tumor.  ABDOMEN/PELVIS  4.3 cm mass within the right hepatic lobe is noted. There is a minimal increase in activity associated with this mass  above background activity. The SUV max is equal to 3.3 (versus 2.9 background). No abnormal hypermetabolic activity within the liver, pancreas, adrenal glands, or spleen. No hypermetabolic lymph nodes in the abdomen or pelvis.  SKELETON  No focal hypermetabolic activity to suggest skeletal metastasis.  IMPRESSION: 1. Findings compatible with primary bronchogenic carcinoma involving the left lung. There is evidence of lymphangitic spread throughout the left lung. 2. Hypermetabolic bilateral hilar and bilateral mediastinal lymph nodes. There also hypermetabolic left-sided level 5 and level 4 cervical lymph nodes worrisome for metastatic adenopathy. 3. Left pleural effusion. 4. Right hepatic lobe mass is indeterminate. Only mild increased tracer uptake is identified within this mass above background activity. Because of the obvious staging implications, consider further evaluation with percutaneous biopsy.   Electronically Signed   By: Kerby Moors M.D.   On: 03/30/2014 10:32   Dg Chest Port 1 View  03/17/2014   CLINICAL DATA:  Left upper lobe bronchoscopy and biopsies.  EXAM: PORTABLE CHEST - 1 VIEW  COMPARISON:  03/17/2014 at 7:03 a.m.  FINDINGS: Normal heart size and mediastinal contours. Increased opacification in the left lung diffusely, but most dense in the upper lobe. There is a left pleural effusion which is small and without evidence of increase. No pneumothorax. Clear right lung.  IMPRESSION: 1. Increased airspace opacification on the left, which could represent alveolar hemorrhage or bronchoscopy lavage. 2. No pneumothorax.   Electronically Signed   By: Jorje Guild M.D.   On: 03/17/2014 08:59   Dg C-arm Bronchoscopy  03/17/2014   CLINICAL DATA: bronch procedure   C-ARM BRONCHOSCOPY  Fluoroscopy was utilized by the requesting physician.  No radiographic  interpretation.     ASSESSMENT AND PLAN: this is a very pleasant 73 years old white male with stage IV non-small cell lung cancer,  adenocarcinoma presenting with large right upper lobe lung mass in addition to mediastinal and bilateral hilar lymphadenopathy as well as cervical lymphadenopathy and left pleural effusion. The molecular biomarker result is still pending. His MRI of the brain showed no evidence  for metastatic disease to the brain and PET scan showed the liver lesion still indeterminate but less likely to be malignant. The patient is being treated with systemic chemotherapy in the form of carboplatin for AUC of 5 and Alimta 500 mg/M2 every 3 weeks, status post 1 cycle. I discussed ways for the patient to adjust his bowel maintenance with MiraLAX to accommodate the increased constipation after chemotherapy. Both he and his wife voiced understanding. For the oral candidiasis, a prescription for Diflucan was sent to his pharmacy of record via [facility].he will continue with weekly labs as scheduled. He'll return in 2 weeks prior to the start of cycle #2 of his systemic chemotherapy with carboplatin and Alimta.   He was advised to call immediately if he has any concerning symptoms in the interval.  The patient voices understanding of current disease status and treatment options and is in agreement with the current care plan.  All questions were answered. The patient knows to call the clinic with any problems, questions or concerns. We can certainly see the patient much sooner if necessary.  Carlton Adam, PA-C  ADDENDUM: Hematology/Oncology Attending: I had a face to face encounter with the patient. I recommended his care plan. He is a very pleasant 73 years old white male recently diagnosed with stage IV non-small cell lung cancer, adenocarcinoma. He is currently undergoing systemic chemotherapy with carboplatin and Alimta status post 1 cycle. He tolerated the first cycle of his treatment fairly well except for mild fatigue a few days after the treatment. He denied having any significant nausea or vomiting, no fever  or chills. The patient would come back for follow up visit in 2 weeks for evaluation before starting cycle #2. He was advised to call immediately if he has any concerning symptoms in the interval.  Disclaimer: This note was dictated with voice recognition software. Similar sounding words can inadvertently be transcribed and may not be corrected upon review. Curt Bears, MD 04/21/2014

## 2014-04-21 LAB — URINE CULTURE

## 2014-04-21 NOTE — Patient Instructions (Signed)
Continue weekly labs as scheduled Digestion MiraLAX as discussed to control the constipation associated with chemotherapy Followup in 2 weeks prior to starting her next cycle of chemotherapy

## 2014-04-26 LAB — HM DIABETES EYE EXAM

## 2014-04-27 ENCOUNTER — Other Ambulatory Visit (HOSPITAL_BASED_OUTPATIENT_CLINIC_OR_DEPARTMENT_OTHER): Payer: Medicare Other

## 2014-04-27 DIAGNOSIS — C3410 Malignant neoplasm of upper lobe, unspecified bronchus or lung: Secondary | ICD-10-CM

## 2014-04-27 DIAGNOSIS — C3490 Malignant neoplasm of unspecified part of unspecified bronchus or lung: Secondary | ICD-10-CM

## 2014-04-27 LAB — CBC WITH DIFFERENTIAL/PLATELET
BASO%: 0.3 % (ref 0.0–2.0)
BASOS ABS: 0 10*3/uL (ref 0.0–0.1)
EOS ABS: 0.1 10*3/uL (ref 0.0–0.5)
EOS%: 2.8 % (ref 0.0–7.0)
HCT: 47.2 % (ref 38.4–49.9)
HGB: 16.6 g/dL (ref 13.0–17.1)
LYMPH%: 21 % (ref 14.0–49.0)
MCH: 31.7 pg (ref 27.2–33.4)
MCHC: 35.2 g/dL (ref 32.0–36.0)
MCV: 90.1 fL (ref 79.3–98.0)
MONO#: 0.4 10*3/uL (ref 0.1–0.9)
MONO%: 10.8 % (ref 0.0–14.0)
NEUT#: 2.3 10*3/uL (ref 1.5–6.5)
NEUT%: 65.1 % (ref 39.0–75.0)
Platelets: 96 10*3/uL — ABNORMAL LOW (ref 140–400)
RBC: 5.24 10*6/uL (ref 4.20–5.82)
RDW: 12.6 % (ref 11.0–14.6)
WBC: 3.5 10*3/uL — AB (ref 4.0–10.3)
lymph#: 0.7 10*3/uL — ABNORMAL LOW (ref 0.9–3.3)
nRBC: 0 % (ref 0–0)

## 2014-04-27 LAB — COMPREHENSIVE METABOLIC PANEL (CC13)
ALBUMIN: 3.4 g/dL — AB (ref 3.5–5.0)
ALT: 30 U/L (ref 0–55)
ANION GAP: 10 meq/L (ref 3–11)
AST: 22 U/L (ref 5–34)
Alkaline Phosphatase: 70 U/L (ref 40–150)
BUN: 13.9 mg/dL (ref 7.0–26.0)
CO2: 27 meq/L (ref 22–29)
Calcium: 9.3 mg/dL (ref 8.4–10.4)
Chloride: 105 mEq/L (ref 98–109)
Creatinine: 0.9 mg/dL (ref 0.7–1.3)
GLUCOSE: 216 mg/dL — AB (ref 70–140)
Potassium: 4.8 mEq/L (ref 3.5–5.1)
SODIUM: 141 meq/L (ref 136–145)
TOTAL PROTEIN: 6.2 g/dL — AB (ref 6.4–8.3)
Total Bilirubin: 0.82 mg/dL (ref 0.20–1.20)

## 2014-04-28 ENCOUNTER — Encounter: Payer: Self-pay | Admitting: Family Medicine

## 2014-04-29 LAB — AFB CULTURE WITH SMEAR (NOT AT ARMC)
Acid Fast Smear: NONE SEEN
SPECIAL REQUESTS: NORMAL

## 2014-05-04 ENCOUNTER — Ambulatory Visit (HOSPITAL_BASED_OUTPATIENT_CLINIC_OR_DEPARTMENT_OTHER): Payer: Medicare Other

## 2014-05-04 ENCOUNTER — Ambulatory Visit: Payer: Medicare Other

## 2014-05-04 ENCOUNTER — Encounter: Payer: Self-pay | Admitting: Physician Assistant

## 2014-05-04 ENCOUNTER — Telehealth: Payer: Self-pay | Admitting: Internal Medicine

## 2014-05-04 ENCOUNTER — Ambulatory Visit (HOSPITAL_BASED_OUTPATIENT_CLINIC_OR_DEPARTMENT_OTHER): Payer: Medicare Other | Admitting: Physician Assistant

## 2014-05-04 ENCOUNTER — Other Ambulatory Visit (HOSPITAL_BASED_OUTPATIENT_CLINIC_OR_DEPARTMENT_OTHER): Payer: Medicare Other

## 2014-05-04 VITALS — BP 150/78 | HR 76 | Temp 97.1°F | Resp 18 | Ht 69.0 in

## 2014-05-04 DIAGNOSIS — R599 Enlarged lymph nodes, unspecified: Secondary | ICD-10-CM

## 2014-05-04 DIAGNOSIS — C3410 Malignant neoplasm of upper lobe, unspecified bronchus or lung: Secondary | ICD-10-CM

## 2014-05-04 DIAGNOSIS — C3490 Malignant neoplasm of unspecified part of unspecified bronchus or lung: Secondary | ICD-10-CM

## 2014-05-04 DIAGNOSIS — Z5111 Encounter for antineoplastic chemotherapy: Secondary | ICD-10-CM

## 2014-05-04 DIAGNOSIS — K7689 Other specified diseases of liver: Secondary | ICD-10-CM

## 2014-05-04 LAB — CBC WITH DIFFERENTIAL/PLATELET
BASO%: 0.2 % (ref 0.0–2.0)
Basophils Absolute: 0 10*3/uL (ref 0.0–0.1)
EOS%: 0 % (ref 0.0–7.0)
Eosinophils Absolute: 0 10*3/uL (ref 0.0–0.5)
HCT: 44.3 % (ref 38.4–49.9)
HGB: 15.3 g/dL (ref 13.0–17.1)
LYMPH#: 0.5 10*3/uL — AB (ref 0.9–3.3)
LYMPH%: 8.3 % — ABNORMAL LOW (ref 14.0–49.0)
MCH: 31.3 pg (ref 27.2–33.4)
MCHC: 34.5 g/dL (ref 32.0–36.0)
MCV: 90.6 fL (ref 79.3–98.0)
MONO#: 0.5 10*3/uL (ref 0.1–0.9)
MONO%: 8.5 % (ref 0.0–14.0)
NEUT#: 4.7 10*3/uL (ref 1.5–6.5)
NEUT%: 83 % — ABNORMAL HIGH (ref 39.0–75.0)
Platelets: 224 10*3/uL (ref 140–400)
RBC: 4.89 10*6/uL (ref 4.20–5.82)
RDW: 13.2 % (ref 11.0–14.6)
WBC: 5.7 10*3/uL (ref 4.0–10.3)

## 2014-05-04 LAB — COMPREHENSIVE METABOLIC PANEL (CC13)
ALBUMIN: 3.6 g/dL (ref 3.5–5.0)
ALT: 22 U/L (ref 0–55)
AST: 12 U/L (ref 5–34)
Alkaline Phosphatase: 81 U/L (ref 40–150)
Anion Gap: 12 mEq/L — ABNORMAL HIGH (ref 3–11)
BUN: 15.2 mg/dL (ref 7.0–26.0)
CHLORIDE: 106 meq/L (ref 98–109)
CO2: 21 mEq/L — ABNORMAL LOW (ref 22–29)
CREATININE: 0.8 mg/dL (ref 0.7–1.3)
Calcium: 9.3 mg/dL (ref 8.4–10.4)
Glucose: 286 mg/dl — ABNORMAL HIGH (ref 70–140)
POTASSIUM: 4.5 meq/L (ref 3.5–5.1)
SODIUM: 139 meq/L (ref 136–145)
Total Bilirubin: 0.52 mg/dL (ref 0.20–1.20)
Total Protein: 6.3 g/dL — ABNORMAL LOW (ref 6.4–8.3)

## 2014-05-04 MED ORDER — DEXAMETHASONE SODIUM PHOSPHATE 20 MG/5ML IJ SOLN
INTRAMUSCULAR | Status: AC
  Filled 2014-05-04: qty 5

## 2014-05-04 MED ORDER — SODIUM CHLORIDE 0.9 % IV SOLN
Freq: Once | INTRAVENOUS | Status: AC
  Administered 2014-05-04: 10:00:00 via INTRAVENOUS

## 2014-05-04 MED ORDER — DEXAMETHASONE SODIUM PHOSPHATE 20 MG/5ML IJ SOLN
20.0000 mg | Freq: Once | INTRAMUSCULAR | Status: AC
  Administered 2014-05-04: 20 mg via INTRAVENOUS

## 2014-05-04 MED ORDER — SODIUM CHLORIDE 0.9 % IV SOLN
500.0000 mg | Freq: Once | INTRAVENOUS | Status: AC
  Administered 2014-05-04: 500 mg via INTRAVENOUS
  Filled 2014-05-04: qty 50

## 2014-05-04 MED ORDER — ONDANSETRON 16 MG/50ML IVPB (CHCC)
INTRAVENOUS | Status: AC
  Filled 2014-05-04: qty 16

## 2014-05-04 MED ORDER — SODIUM CHLORIDE 0.9 % IV SOLN
500.0000 mg/m2 | Freq: Once | INTRAVENOUS | Status: AC
  Administered 2014-05-04: 1000 mg via INTRAVENOUS
  Filled 2014-05-04: qty 40

## 2014-05-04 MED ORDER — ONDANSETRON 16 MG/50ML IVPB (CHCC)
16.0000 mg | Freq: Once | INTRAVENOUS | Status: AC
  Administered 2014-05-04: 16 mg via INTRAVENOUS

## 2014-05-04 NOTE — Patient Instructions (Signed)
[facility] Discharge Instructions for Patients Receiving Chemotherapy  Today you received the following chemotherapy agents: Alimta, Carboplatin  To help prevent nausea and vomiting after your treatment, we encourage you to take your nausea medication: Compazine 10 mg every 6 hrs as needed.   If you develop nausea and vomiting that is not controlled by your nausea medication, call the clinic.   BELOW ARE SYMPTOMS THAT SHOULD BE REPORTED IMMEDIATELY:  *FEVER GREATER THAN 100.5 F  *CHILLS WITH OR WITHOUT FEVER  NAUSEA AND VOMITING THAT IS NOT CONTROLLED WITH YOUR NAUSEA MEDICATION  *UNUSUAL SHORTNESS OF BREATH  *UNUSUAL BRUISING OR BLEEDING  TENDERNESS IN MOUTH AND THROAT WITH OR WITHOUT PRESENCE OF ULCERS  *URINARY PROBLEMS  *BOWEL PROBLEMS  UNUSUAL RASH Items with * indicate a potential emergency and should be followed up as soon as possible.  Feel free to call the clinic you have any questions or concerns. The clinic phone number is [phone].

## 2014-05-04 NOTE — Progress Notes (Addendum)
[place] Telephone:([phone]   Fax:(336) [place], [place] [phone]  DIAGNOSIS: Stage IIIB/IV non-small cell lung cancer, adenocarcinoma diagnosed in March of 2015. Specimen sent to [facility] for biomarker testing however there was insufficient material to proceed with testing  PRIOR THERAPY: None.  CURRENT THERAPY: systemic chemotherapy with carboplatin for AUC of 5 and Alimta 500 mg/M2 every 3 weeks. First dose given on 04/13/2014. Status post 1 cycle.  INTERVAL HISTORY: Shawn Espinoza 73 y.o. male returns to the clinic today for a follow up visit . He is status post 1 cycle. He tolerated his first cycle of chemotherapy relatively well with the exception of some constipation.he notes some difficulty with his allergies and does note some congestion. Denies any significant shortness of breath. He is utilizing Mucinex. He reports that he is drinking at least 64 ounces of water daily. He is been able to play golf the past 2 days. He notes a rash on his legs a bit on his arms.  He does note that his urine stream is weak and is not sure he is completely emptying his bladder. He denies any fever or chills. He is accompanied by his wife.   He did have some fatigue and generalized malaise related to the chemotherapy. His appetite is improved and he has regained 8 pounds lost. He voiced no other complaints.He continues to have shortness of breath at baseline and increased with exertion. He denied having any significant chest pain but continues to have mild cough with no hemoptysis.   MEDICAL HISTORY: Past Medical History  Diagnosis Date  . History of diabetes mellitus, type II     resolved with diet, h/o neuropathy  . Diverticulosis of colon   . Hyperlipidemia   . Hypertension   . Fatty liver 02/29/00    abd ultrasound  . Lower back pain   . Systolic murmur 1975    2Decho - normal LV  fxn, EF 55%, mild AS, biatrial enlargement  . History of tobacco use quit 1990s  . 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 Personal history of colonic adenomas 07/06/2013  . Hypertensive retinopathy of both eyes 2015    mild    ALLERGIES:  is allergic to candesartan cilexetil; diltiazem hcl; doxazosin mesylate; nifedipine; pravastatin; red yeast rice; rosuvastatin; and simvastatin.  MEDICATIONS:  Current Outpatient Prescriptions  Medication Sig Dispense Refill  . amLODipine (NORVASC) 5 MG tablet Take 1 tablet (5 mg total) by mouth daily.  90 tablet  2  . atenolol (TENORMIN) 25 MG tablet Take 1 tablet (25 mg total) by mouth 2 (two) times daily.  180 tablet  2  . dexamethasone (DECADRON) 4 MG tablet 4 mg by mouth twice a day the day before, day of and day after the chemotherapy every 3 weeks.  40 tablet  1  . Digestive Enzymes (PAPAYA AND ENZYMES PO) Take by mouth as needed.        . folic acid (FOLVITE) 1 MG tablet Take 1 tablet (1 mg total) by mouth daily.  30 tablet  4  . glucose blood (ONE TOUCH ULTRA TEST) test strip USE ONE STRIP TO CHECK GLUCOSE ONCE DAILY IN THE MORNING 4 TO 5 DAYS A WEEK Dx:250.00  100 each  3  .  guaiFENesin (MUCINEX) 600 MG 12 hr tablet Take by mouth 2 (two) times daily.      Marland Kitchen ibuprofen (ADVIL,MOTRIN) 800 MG tablet Take 1 tablet (800 mg total) by mouth 2 (two) times daily as needed.  40 tablet  1  . Lancets (ONETOUCH ULTRASOFT) lancets Use to check sugar 4-5 times weekly. Dx: 250.00  100 each  3  . methocarbamol (ROBAXIN) 500 MG tablet Take 500 mg by mouth every 8 (eight) hours as needed.      . Milk Thistle 200 MG CAPS Take 1 capsule by mouth 2 (two) times daily.      . Polyethylene Glycol 3350 (MIRALAX PO) Take by mouth as directed. Takes 1/2 dose PRN      . prochlorperazine (COMPAZINE) 10 MG tablet Take 1 tablet (10 mg total) by mouth every 6 (six)  hours as needed for nausea or vomiting.  60 tablet  0  . cholecalciferol (VITAMIN D) 1000 UNITS tablet Take 1,000 Units by mouth daily.      . fluconazole (DIFLUCAN) 100 MG tablet Take 1 tablet (100 mg total) by mouth daily.  10 tablet  0  . LORazepam (ATIVAN) 0.5 MG tablet Take 1 tablet (0.5 mg total) by mouth as directed. Take 0.50m 30 minutes prior to MRI  1 tablet  0  . Multiple Vitamin (MULTIVITAMIN) tablet Take 1 tablet by mouth daily.        . Multiple Vitamins-Minerals (EYE VITAMINS PO) 2 (two) times daily. Due to macular degeneration      . vitamin C (ASCORBIC ACID) 500 MG tablet Take 250 mg by mouth daily.        No current facility-administered medications for this visit.    SURGICAL HISTORY:  Past Surgical History  Procedure Laterality Date  . Colonoscopy  2004  . Colonoscopy  2014    tubular adenoma x1, mod diverticulosis ([facility])  . Flexible bronchoscopy  02/2014    WNL  . Video bronchoscopy Bilateral 03/17/2014    Procedure: VIDEO BRONCHOSCOPY WITH FLUORO;  Surgeon: MTanda Rockers MD;  Location: [facility];  Service: Cardiopulmonary;  Laterality: Bilateral;    REVIEW OF SYSTEMS:  Constitutional: positive for fatigue and malaise Eyes: negative Ears, nose, mouth, throat, and face: negative Respiratory: positive for cough and dyspnea on exertion Cardiovascular: negative Gastrointestinal: positive for constipation Genitourinary:positive for decreased stream Integument/breast: positive for rash Hematologic/lymphatic: negative Musculoskeletal:negative Neurological: negative Behavioral/Psych: negative Endocrine: negative Allergic/Immunologic: negative   PHYSICAL EXAMINATION: General appearance: alert, cooperative, fatigued and no distress Head: Normocephalic, without obvious abnormality, atraumatic Neck: no adenopathy, no JVD, supple, symmetrical, trachea midline and thyroid not enlarged, symmetric, no tenderness/mass/nodules Lymph nodes: Cervical, supraclavicular,  and axillary nodes normal. Resp: diminished breath sounds LLL and dullness to percussion LLL Back: symmetric, no curvature. ROM normal. No CVA tenderness. Cardio: regular rate and rhythm, S1, S2 normal, no murmur, click, rub or gallop GI: soft, non-tender; bowel sounds normal; no masses,  no organomegaly Extremities: extremities normal, atraumatic, no cyanosis or edema Neurologic: Alert and oriented X 3, normal strength and tone. Normal symmetric reflexes. Normal coordination and gait Mouth: Clear, no evidence of thrush or mucositis Skin: Erythematous to and macular lesions scattered over the lower extremities and bilateral forearms in the sun exposed areas  ECOG PERFORMANCE STATUS: 1 - Symptomatic but completely ambulatory  Blood pressure 150/78, pulse 76, temperature 97.1 F (36.2 C), temperature source Oral, resp. rate 18, height _0  (1.753 m).  LABORATORY DATA: Lab Results  Component Value Date  WBC 5.7 05/04/2014   HGB 15.3 05/04/2014   HCT 44.3 05/04/2014   MCV 90.6 05/04/2014   PLT 224 05/04/2014      Chemistry      Component Value Date/Time   NA 139 05/04/2014 0906   NA 141 03/09/2014 0834   K 4.5 05/04/2014 0906   K 4.5 03/09/2014 0834   CL 104 03/09/2014 0834   CO2 21* 05/04/2014 0906   CO2 30 03/09/2014 0834   BUN 15.2 05/04/2014 0906   BUN 15 03/09/2014 0834   CREATININE 0.8 05/04/2014 0906   CREATININE 0.8 03/09/2014 0834   CREATININE 0.83 02/22/2014 1314      Component Value Date/Time   CALCIUM 9.3 05/04/2014 0906   CALCIUM 8.7 03/09/2014 0834   ALKPHOS 81 05/04/2014 0906   ALKPHOS 55 03/09/2014 0834   AST 12 05/04/2014 0906   AST 20 03/09/2014 0834   ALT 22 05/04/2014 0906   ALT 21 03/09/2014 0834   BILITOT 0.52 05/04/2014 0906   BILITOT 1.2 03/09/2014 0834       RADIOGRAPHIC STUDIES: Dg Eye Foreign Body  04/01/2014   CLINICAL DATA:  Metal working/exposure; clearance prior to MRI  EXAM: ORBITS FOR FOREIGN BODY - 2 VIEW  COMPARISON:  None.  FINDINGS: There is no  evidence of metallic foreign body within the orbits. No significant bone abnormality identified.  IMPRESSION: No evidence of metallic foreign body within the orbits.   Electronically Signed   By: Rozetta Nunnery M.D.   On: 04/01/2014 11:23   Dg Chest 2 View  03/17/2014   CLINICAL DATA:  Abnormal chest CT.  Cough.  Pre bronchoscopy.  EXAM: CHEST  2 VIEW  COMPARISON:  CT chest February 23, 2014  FINDINGS: The heart size and mediastinal contours are within normal limits. There is diffuse increased pulmonary interstitium and ground-glass opacity throughout the left upper and left lower lobes. There is minimal left pleural effusion. The right lung is clear. Degenerative joint changes of the spine are noted.  IMPRESSION: Diffuse increased pulmonary interstitium and ground-glass opacity throughout the left upper and lower lobes which were seen on prior CT. Minimal left pleural effusion.   Electronically Signed   By: Abelardo Diesel M.D.   On: 03/17/2014 08:25   Mr Jeri Cos LK Contrast  04/01/2014   CLINICAL DATA:  Adenocarcinoma of lung. Evaluate for intracranial metastatic disease.  EXAM: MRI HEAD WITHOUT AND WITH CONTRAST  TECHNIQUE: Multiplanar, multiecho pulse sequences of the brain and surrounding structures were obtained without and with intravenous contrast.  CONTRAST:  25m MULTIHANCE GADOBENATE DIMEGLUMINE 529 MG/ML IV SOLN  COMPARISON:  PET scan performed 03/30/2014.  FINDINGS: No evidence for acute infarction, hemorrhage, mass lesion, hydrocephalus, or extra-axial fluid. Mild to moderate cerebral and cerebellar atrophy. Prominent perivascular spaces. Chronic microvascular ischemic change of a moderate degree in the periventricular and subcortical white matter. 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  Post infusion, no abnormal enhancement of the brain or meninges. Pituitary, pineal, and cerebellar tonsils unremarkable. No upper cervical lesions. Flow voids are maintained throughout the  carotid, basilar, and vertebral arteries. There are no areas of chronic hemorrhage. Visualized calvarium, skull base, and upper cervical osseous structures unremarkable. Scalp and extracranial soft tissues, orbits, sinuses, and mastoids show no acute process.  IMPRESSION: Atrophy, small vessel disease, and remote lacunar infarcts. No acute intracranial findings.  No visible intracranial metastatic disease.   Electronically Signed   By: JRolla FlattenM.D.   On: 04/01/2014 15:51  Nm Pet Image Initial (pi) Skull Base To Thigh  03/30/2014   CLINICAL DATA:  Initial treatment strategy for Lung cancer.  EXAM: NUCLEAR MEDICINE PET SKULL BASE TO THIGH  TECHNIQUE: 9.6 mCi F-18 FDG was injected intravenously. Full-ring PET imaging was performed from the skull base to thigh after the radiotracer. CT data was obtained and used for attenuation correction and anatomic localization.  FASTING BLOOD GLUCOSE:  Value: 141 mg/dl  COMPARISON:  CT 02/23/2014.  Marland Kitchen  FINDINGS: NECK  Hypermetabolic left level 4 and level 5 lymph nodes are identified. Index lymph node within the left level 4 region has an SUV max equal to 3.5. Also in the left level 4 region there is a hypermetabolic lymph node with an SUV max equal to 3.8.  CHEST  The heart size is normal. There is no pericardial effusion. Multiple hypermetabolic mediastinal and bilateral hilar lymph nodes are identified. Index high right paratracheal lymph node has an SUV max equal to 5.8. Hypermetabolic sub- carinal lymph node has an SUV max equal to 6.3. The right hilar lymph node has an SUV max equal to 3.9. The left hilar lymph node has an SUV max equal to 6.3.  Mild changes of centrilobular emphysema noted. There is a small nodule within the right lower lobe which measures 6 mm, image 89. This is too small to characterize by PET-CT. Several tiny nodules within the right upper lobe are better seen on the diagnostic chest CT from 02/23/2014. These are also too small to characterize by  PET-CT. There is a moderate volume left pleural effusion. Pulmonary nodule within the left lower lobe has an SUV max equal 5.1. There is diffuse interlobular septal thickening within the left upper lobe and left lower lobe which concerning for lymphangitic spread of tumor.  ABDOMEN/PELVIS  4.3 cm mass within the right hepatic lobe is noted. There is a minimal increase in activity associated with this mass above background activity. The SUV max is equal to 3.3 (versus 2.9 background). No abnormal hypermetabolic activity within the liver, pancreas, adrenal glands, or spleen. No hypermetabolic lymph nodes in the abdomen or pelvis.  SKELETON  No focal hypermetabolic activity to suggest skeletal metastasis.  IMPRESSION: 1. Findings compatible with primary bronchogenic carcinoma involving the left lung. There is evidence of lymphangitic spread throughout the left lung. 2. Hypermetabolic bilateral hilar and bilateral mediastinal lymph nodes. There also hypermetabolic left-sided level 5 and level 4 cervical lymph nodes worrisome for metastatic adenopathy. 3. Left pleural effusion. 4. Right hepatic lobe mass is indeterminate. Only mild increased tracer uptake is identified within this mass above background activity. Because of the obvious staging implications, consider further evaluation with percutaneous biopsy.   Electronically Signed   By: Kerby Moors M.D.   On: 03/30/2014 10:32   Dg Chest Port 1 View  03/17/2014   CLINICAL DATA:  Left upper lobe bronchoscopy and biopsies.  EXAM: PORTABLE CHEST - 1 VIEW  COMPARISON:  03/17/2014 at 7:03 a.m.  FINDINGS: Normal heart size and mediastinal contours. Increased opacification in the left lung diffusely, but most dense in the upper lobe. There is a left pleural effusion which is small and without evidence of increase. No pneumothorax. Clear right lung.  IMPRESSION: 1. Increased airspace opacification on the left, which could represent alveolar hemorrhage or bronchoscopy  lavage. 2. No pneumothorax.   Electronically Signed   By: Jorje Guild M.D.   On: 03/17/2014 08:59   Dg C-arm Bronchoscopy  03/17/2014   CLINICAL DATA: bronch procedure  C-ARM BRONCHOSCOPY  Fluoroscopy was utilized by the requesting physician.  No radiographic  interpretation.     ASSESSMENT AND PLAN: this is a very pleasant 73 years old white male with stage IV non-small cell lung cancer, adenocarcinoma presenting with large right upper lobe lung mass in addition to mediastinal and bilateral hilar lymphadenopathy as well as cervical lymphadenopathy and left pleural effusion. The molecular biomarker result is still pending. His MRI of the brain showed no evidence for metastatic disease to the brain and PET scan showed the liver lesion still indeterminate but less likely to be malignant. The patient is being treated with systemic chemotherapy in the form of carboplatin for AUC of 5 and Alimta 500 mg/M2 every 3 weeks, status post 1 cycle. To better manage his constipation the patient will continue to take MiraLAX a half dose twice daily and adjusted as needed. The patient was discussed with also seen by Dr. [name]. As there was insufficient material to proceed with biomarker testing through [facility], the patient will have serum sent to [facility] for biomarker evaluation.Both he and his wife voiced understanding.The oral candidiasis has resolved. He will continue with weekly labs consisting of a CBC differential and C. met. He'll return in 3 weeks prior to the start of cycle #3 of his systemic chemotherapy with carboplatin and Alimta.   He was advised to call immediately if he has any concerning symptoms in the interval.  The patient voices understanding of current disease status and treatment options and is in agreement with the current care plan.  All questions were answered. The patient knows to call the clinic with any problems, questions or concerns. We can certainly see the patient much  sooner if necessary.  Carlton Adam, PA-C  ADDENDUM: Hematology/Oncology Attending: I had a face to face encounter with the patient. I recommended his care plan. This is a very pleasant 73 years old white male with metastatic non-small cell lung cancer, adenocarcinoma currently undergoing systemic chemotherapy with carboplatin and Alimta status post 1 cycle. He tolerated the first cycle of his treatment fairly well with no significant adverse effects. The patient had insufficient material to be tested for molecular biomarkers. I discussed with him sending a blood sample to [facility] for biomarker testing and he is in agreement with the current plan. He will proceed with cycle #2 of his chemotherapy today as scheduled. He would come back for followup visit in 3 weeks with the next cycle of his treatment. He was advised to call immediately if he has any concerning symptoms in the interval.  Disclaimer: This note was dictated with voice recognition software. Similar sounding words can inadvertently be transcribed and may not be corrected upon review. Curt Bears, MD 05/07/2014

## 2014-05-04 NOTE — Telephone Encounter (Signed)
add to previous note. s/w lab manager re add on lab - this lab can be done in inf - added add on and tubes will be sent to inf.

## 2014-05-04 NOTE — Telephone Encounter (Signed)
gv pt appt schedule for may/june which included wkly appts from 5/20 to 6/10.

## 2014-05-07 NOTE — Patient Instructions (Addendum)
Continue taking MiraLAX as discussed for your constipation Continue weekly labs as scheduled Follow up in 3 weeks

## 2014-05-09 ENCOUNTER — Telehealth: Payer: Self-pay | Admitting: Internal Medicine

## 2014-05-09 NOTE — Telephone Encounter (Signed)
s.w. pt wife and r/s June lab to diff day per request...done pt aware of new d.t

## 2014-05-11 ENCOUNTER — Telehealth: Payer: Self-pay | Admitting: *Deleted

## 2014-05-11 ENCOUNTER — Other Ambulatory Visit (HOSPITAL_BASED_OUTPATIENT_CLINIC_OR_DEPARTMENT_OTHER): Payer: Medicare Other

## 2014-05-11 DIAGNOSIS — C3490 Malignant neoplasm of unspecified part of unspecified bronchus or lung: Secondary | ICD-10-CM

## 2014-05-11 DIAGNOSIS — C3410 Malignant neoplasm of upper lobe, unspecified bronchus or lung: Secondary | ICD-10-CM

## 2014-05-11 LAB — CBC WITH DIFFERENTIAL/PLATELET
BASO%: 1.3 % (ref 0.0–2.0)
BASOS ABS: 0 10*3/uL (ref 0.0–0.1)
EOS ABS: 0 10*3/uL (ref 0.0–0.5)
EOS%: 2.3 % (ref 0.0–7.0)
HCT: 45.1 % (ref 38.4–49.9)
HEMOGLOBIN: 15.1 g/dL (ref 13.0–17.1)
LYMPH#: 0.6 10*3/uL — AB (ref 0.9–3.3)
LYMPH%: 30.6 % (ref 14.0–49.0)
MCH: 31.2 pg (ref 27.2–33.4)
MCHC: 33.5 g/dL (ref 32.0–36.0)
MCV: 92.9 fL (ref 79.3–98.0)
MONO#: 0.2 10*3/uL (ref 0.1–0.9)
MONO%: 9.9 % (ref 0.0–14.0)
NEUT#: 1.1 10*3/uL — ABNORMAL LOW (ref 1.5–6.5)
NEUT%: 55.9 % (ref 39.0–75.0)
PLATELETS: 113 10*3/uL — AB (ref 140–400)
RBC: 4.86 10*6/uL (ref 4.20–5.82)
RDW: 13.4 % (ref 11.0–14.6)
WBC: 2 10*3/uL — AB (ref 4.0–10.3)

## 2014-05-11 LAB — COMPREHENSIVE METABOLIC PANEL (CC13)
ALT: 21 U/L (ref 0–55)
ANION GAP: 11 meq/L (ref 3–11)
AST: 17 U/L (ref 5–34)
Albumin: 3.4 g/dL — ABNORMAL LOW (ref 3.5–5.0)
Alkaline Phosphatase: 70 U/L (ref 40–150)
BUN: 22.2 mg/dL (ref 7.0–26.0)
CO2: 25 meq/L (ref 22–29)
CREATININE: 0.9 mg/dL (ref 0.7–1.3)
Calcium: 8.6 mg/dL (ref 8.4–10.4)
Chloride: 105 mEq/L (ref 98–109)
Glucose: 196 mg/dl — ABNORMAL HIGH (ref 70–140)
Potassium: 4.4 mEq/L (ref 3.5–5.1)
Sodium: 140 mEq/L (ref 136–145)
TOTAL PROTEIN: 5.7 g/dL — AB (ref 6.4–8.3)
Total Bilirubin: 1.01 mg/dL (ref 0.20–1.20)

## 2014-05-11 NOTE — Telephone Encounter (Signed)
Pt's wife called stating he is c/o a rash on his upper hand, arms and legs.  No itching, they are little red bumps, not raised.  Per Dr Vista Mink, it is probably r/t alimta.  He can use hyrdocortisine cream and can extend his decadron he takes for chemo to 5-6 days versus just taking the 3 days (the day before, day of and day after chemo).  Pt's wife verbalized understanding.  SLJ

## 2014-05-18 ENCOUNTER — Other Ambulatory Visit (HOSPITAL_BASED_OUTPATIENT_CLINIC_OR_DEPARTMENT_OTHER): Payer: Medicare Other

## 2014-05-18 DIAGNOSIS — C3410 Malignant neoplasm of upper lobe, unspecified bronchus or lung: Secondary | ICD-10-CM

## 2014-05-18 DIAGNOSIS — C3490 Malignant neoplasm of unspecified part of unspecified bronchus or lung: Secondary | ICD-10-CM

## 2014-05-18 LAB — COMPREHENSIVE METABOLIC PANEL (CC13)
ALBUMIN: 3.3 g/dL — AB (ref 3.5–5.0)
ALT: 24 U/L (ref 0–55)
AST: 19 U/L (ref 5–34)
Alkaline Phosphatase: 68 U/L (ref 40–150)
Anion Gap: 10 mEq/L (ref 3–11)
BUN: 13.6 mg/dL (ref 7.0–26.0)
CALCIUM: 8.7 mg/dL (ref 8.4–10.4)
CHLORIDE: 107 meq/L (ref 98–109)
CO2: 26 mEq/L (ref 22–29)
Creatinine: 0.8 mg/dL (ref 0.7–1.3)
Glucose: 238 mg/dl — ABNORMAL HIGH (ref 70–140)
POTASSIUM: 4.2 meq/L (ref 3.5–5.1)
Sodium: 143 mEq/L (ref 136–145)
Total Bilirubin: 0.58 mg/dL (ref 0.20–1.20)
Total Protein: 5.8 g/dL — ABNORMAL LOW (ref 6.4–8.3)

## 2014-05-18 LAB — CBC WITH DIFFERENTIAL/PLATELET
BASO%: 0.2 % (ref 0.0–2.0)
Basophils Absolute: 0 10*3/uL (ref 0.0–0.1)
EOS%: 1.5 % (ref 0.0–7.0)
Eosinophils Absolute: 0 10*3/uL (ref 0.0–0.5)
HCT: 42.2 % (ref 38.4–49.9)
HEMOGLOBIN: 14 g/dL (ref 13.0–17.1)
LYMPH#: 0.6 10*3/uL — AB (ref 0.9–3.3)
LYMPH%: 23.9 % (ref 14.0–49.0)
MCH: 31.1 pg (ref 27.2–33.4)
MCHC: 33.1 g/dL (ref 32.0–36.0)
MCV: 93.9 fL (ref 79.3–98.0)
MONO#: 0.4 10*3/uL (ref 0.1–0.9)
MONO%: 16 % — ABNORMAL HIGH (ref 0.0–14.0)
NEUT#: 1.5 10*3/uL (ref 1.5–6.5)
NEUT%: 58.4 % (ref 39.0–75.0)
Platelets: 94 10*3/uL — ABNORMAL LOW (ref 140–400)
RBC: 4.5 10*6/uL (ref 4.20–5.82)
RDW: 13.4 % (ref 11.0–14.6)
WBC: 2.6 10*3/uL — ABNORMAL LOW (ref 4.0–10.3)

## 2014-05-25 ENCOUNTER — Telehealth: Payer: Self-pay | Admitting: Internal Medicine

## 2014-05-25 ENCOUNTER — Ambulatory Visit (HOSPITAL_BASED_OUTPATIENT_CLINIC_OR_DEPARTMENT_OTHER): Payer: Medicare Other | Admitting: Physician Assistant

## 2014-05-25 ENCOUNTER — Encounter: Payer: Self-pay | Admitting: Internal Medicine

## 2014-05-25 ENCOUNTER — Encounter: Payer: Self-pay | Admitting: Physician Assistant

## 2014-05-25 ENCOUNTER — Ambulatory Visit (HOSPITAL_BASED_OUTPATIENT_CLINIC_OR_DEPARTMENT_OTHER): Payer: Medicare Other

## 2014-05-25 ENCOUNTER — Other Ambulatory Visit: Payer: Medicare Other

## 2014-05-25 ENCOUNTER — Other Ambulatory Visit (HOSPITAL_BASED_OUTPATIENT_CLINIC_OR_DEPARTMENT_OTHER): Payer: Medicare Other

## 2014-05-25 VITALS — BP 147/81 | HR 73 | Temp 98.7°F | Resp 18 | Ht 69.0 in | Wt 178.8 lb

## 2014-05-25 DIAGNOSIS — C3490 Malignant neoplasm of unspecified part of unspecified bronchus or lung: Secondary | ICD-10-CM

## 2014-05-25 DIAGNOSIS — J90 Pleural effusion, not elsewhere classified: Secondary | ICD-10-CM

## 2014-05-25 DIAGNOSIS — R599 Enlarged lymph nodes, unspecified: Secondary | ICD-10-CM

## 2014-05-25 DIAGNOSIS — K7689 Other specified diseases of liver: Secondary | ICD-10-CM

## 2014-05-25 DIAGNOSIS — R5383 Other fatigue: Secondary | ICD-10-CM

## 2014-05-25 DIAGNOSIS — R0609 Other forms of dyspnea: Secondary | ICD-10-CM

## 2014-05-25 DIAGNOSIS — R0989 Other specified symptoms and signs involving the circulatory and respiratory systems: Secondary | ICD-10-CM

## 2014-05-25 DIAGNOSIS — C3410 Malignant neoplasm of upper lobe, unspecified bronchus or lung: Secondary | ICD-10-CM

## 2014-05-25 DIAGNOSIS — R5381 Other malaise: Secondary | ICD-10-CM

## 2014-05-25 DIAGNOSIS — Z5111 Encounter for antineoplastic chemotherapy: Secondary | ICD-10-CM

## 2014-05-25 DIAGNOSIS — R05 Cough: Secondary | ICD-10-CM

## 2014-05-25 DIAGNOSIS — R059 Cough, unspecified: Secondary | ICD-10-CM

## 2014-05-25 DIAGNOSIS — R21 Rash and other nonspecific skin eruption: Secondary | ICD-10-CM

## 2014-05-25 LAB — COMPREHENSIVE METABOLIC PANEL (CC13)
ALBUMIN: 3.7 g/dL (ref 3.5–5.0)
ALK PHOS: 73 U/L (ref 40–150)
ALT: 18 U/L (ref 0–55)
AST: 12 U/L (ref 5–34)
Anion Gap: 15 mEq/L — ABNORMAL HIGH (ref 3–11)
BUN: 17.7 mg/dL (ref 7.0–26.0)
CO2: 21 mEq/L — ABNORMAL LOW (ref 22–29)
Calcium: 9.1 mg/dL (ref 8.4–10.4)
Chloride: 104 mEq/L (ref 98–109)
Creatinine: 0.9 mg/dL (ref 0.7–1.3)
Glucose: 202 mg/dl — ABNORMAL HIGH (ref 70–140)
POTASSIUM: 4.5 meq/L (ref 3.5–5.1)
Sodium: 140 mEq/L (ref 136–145)
Total Bilirubin: 0.69 mg/dL (ref 0.20–1.20)
Total Protein: 6.4 g/dL (ref 6.4–8.3)

## 2014-05-25 LAB — CBC WITH DIFFERENTIAL/PLATELET
BASO%: 0.8 % (ref 0.0–2.0)
Basophils Absolute: 0 10*3/uL (ref 0.0–0.1)
EOS%: 0 % (ref 0.0–7.0)
Eosinophils Absolute: 0 10*3/uL (ref 0.0–0.5)
HCT: 43.7 % (ref 38.4–49.9)
HGB: 15 g/dL (ref 13.0–17.1)
LYMPH%: 9.1 % — AB (ref 14.0–49.0)
MCH: 32 pg (ref 27.2–33.4)
MCHC: 34.3 g/dL (ref 32.0–36.0)
MCV: 93.4 fL (ref 79.3–98.0)
MONO#: 0.6 10*3/uL (ref 0.1–0.9)
MONO%: 10 % (ref 0.0–14.0)
NEUT#: 4.7 10*3/uL (ref 1.5–6.5)
NEUT%: 80.1 % — ABNORMAL HIGH (ref 39.0–75.0)
Platelets: 237 10*3/uL (ref 140–400)
RBC: 4.68 10*6/uL (ref 4.20–5.82)
RDW: 14.9 % — AB (ref 11.0–14.6)
WBC: 5.9 10*3/uL (ref 4.0–10.3)
lymph#: 0.5 10*3/uL — ABNORMAL LOW (ref 0.9–3.3)

## 2014-05-25 MED ORDER — PEMETREXED DISODIUM CHEMO INJECTION 500 MG
500.0000 mg/m2 | Freq: Once | INTRAVENOUS | Status: AC
  Administered 2014-05-25: 1000 mg via INTRAVENOUS
  Filled 2014-05-25: qty 40

## 2014-05-25 MED ORDER — SODIUM CHLORIDE 0.9 % IV SOLN
Freq: Once | INTRAVENOUS | Status: AC
  Administered 2014-05-25: 11:00:00 via INTRAVENOUS

## 2014-05-25 MED ORDER — DEXAMETHASONE SODIUM PHOSPHATE 20 MG/5ML IJ SOLN
INTRAMUSCULAR | Status: AC
  Filled 2014-05-25: qty 5

## 2014-05-25 MED ORDER — ONDANSETRON 16 MG/50ML IVPB (CHCC)
16.0000 mg | Freq: Once | INTRAVENOUS | Status: AC
  Administered 2014-05-25: 16 mg via INTRAVENOUS

## 2014-05-25 MED ORDER — CYANOCOBALAMIN 1000 MCG/ML IJ SOLN
INTRAMUSCULAR | Status: AC
  Filled 2014-05-25: qty 1

## 2014-05-25 MED ORDER — SODIUM CHLORIDE 0.9 % IV SOLN
500.0000 mg | Freq: Once | INTRAVENOUS | Status: AC
  Administered 2014-05-25: 500 mg via INTRAVENOUS
  Filled 2014-05-25: qty 50

## 2014-05-25 MED ORDER — CYANOCOBALAMIN 1000 MCG/ML IJ SOLN
1000.0000 ug | Freq: Once | INTRAMUSCULAR | Status: AC
  Administered 2014-05-25: 1000 ug via INTRAMUSCULAR

## 2014-05-25 MED ORDER — DEXAMETHASONE SODIUM PHOSPHATE 20 MG/5ML IJ SOLN
20.0000 mg | Freq: Once | INTRAMUSCULAR | Status: AC
  Administered 2014-05-25: 20 mg via INTRAVENOUS

## 2014-05-25 MED ORDER — ONDANSETRON 16 MG/50ML IVPB (CHCC)
INTRAVENOUS | Status: AC
  Filled 2014-05-25: qty 16

## 2014-05-25 NOTE — Progress Notes (Addendum)
[name] [phone]   Fax:([phone]) [place], [place] [address]  DIAGNOSIS: Stage IIIB/IV non-small cell lung cancer, adenocarcinoma diagnosed in March of 2015. Specimen sent to [facility] for biomarker testing however there was insufficient material to proceed with testing. 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  CURRENT THERAPY: systemic chemotherapy with carboplatin for AUC of 5 and Alimta 500 mg/M2 every 3 weeks. First dose given on 04/13/2014. Status post 2 cycles.  INTERVAL HISTORY: Shawn Espinoza 73 y.o. male returns to the clinic today for a follow up visit. He is accompanied by his wife.  He is status post 2 cycles. He tolerated his first cycle of chemotherapy relatively well with the exception of some constipation.he has adjusted his over-the-counter medications for constipation and this is no longer an issue. He continues to have skin rash on his sun exposed areas as he is playing golf. He recently got some UV sleeves to wear while he is playing golf. He's had some dizziness. He is drinking some water and some juice, totaling about 64 ounces. The dizziness tends to occur with  position change.He voiced no other complaints.He continues to have shortness of breath at baseline and increased with exertion. He denied having any significant chest pain but continues to have mild cough with no hemoptysis.   MEDICAL HISTORY: Past Medical History  Diagnosis Date  . History of diabetes mellitus, type II     resolved with diet, h/o neuropathy  . Diverticulosis of colon   . Hyperlipidemia   . Hypertension   . Fatty liver 02/29/00    abd ultrasound  . Lower back pain   . Systolic murmur 3810    2Decho - normal LV fxn, EF 55%, mild AS, biatrial enlargement  . History of tobacco use quit 1990s   . 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 Personal history of colonic adenomas 07/06/2013  . Hypertensive retinopathy of both eyes 2015    mild    ALLERGIES:  is allergic to candesartan cilexetil; diltiazem hcl; doxazosin mesylate; nifedipine; pravastatin; red yeast rice; rosuvastatin; and simvastatin.  MEDICATIONS:  Current Outpatient Prescriptions  Medication Sig Dispense Refill  . amLODipine (NORVASC) 5 MG tablet Take 1 tablet (5 mg total) by mouth daily.  90 tablet  2  . atenolol (TENORMIN) 25 MG tablet Take 1 tablet (25 mg total) by mouth 2 (two) times daily.  180 tablet  2  . cholecalciferol (VITAMIN D) 1000 UNITS tablet Take 1,000 Units by mouth daily.      Marland Kitchen dexamethasone (DECADRON) 4 MG tablet 4 mg by mouth twice a day the day before, day of and day after the chemotherapy every 3 weeks.  40 tablet  1  . Digestive Enzymes (PAPAYA AND ENZYMES PO) Take by mouth as needed.        . folic acid (FOLVITE) 1 MG tablet Take 1 tablet (1 mg total) by mouth daily.  30 tablet  4  . glucose blood (ONE TOUCH ULTRA TEST) test strip USE ONE STRIP TO CHECK GLUCOSE ONCE DAILY IN THE MORNING 4 TO 5 DAYS A WEEK Dx:250.00  100 each  3  . guaiFENesin (MUCINEX) 600 MG 12 hr  tablet Take by mouth 2 (two) times daily.      Marland Kitchen ibuprofen (ADVIL,MOTRIN) 800 MG tablet Take 1 tablet (800 mg total) by mouth 2 (two) times daily as needed.  40 tablet  1  . Lancets (ONETOUCH ULTRASOFT) lancets Use to check sugar 4-5 times weekly. Dx: 250.00  100 each  3  . methocarbamol (ROBAXIN) 500 MG tablet Take 500 mg by mouth every 8 (eight) hours as needed.      . Milk Thistle 200 MG CAPS Take 1 capsule by mouth 2 (two) times daily.      . Multiple Vitamins-Minerals (EYE VITAMINS PO) 2 (two) times daily. Due to macular degeneration      . Polyethylene Glycol 3350 (MIRALAX PO) Take by mouth as directed. Takes 1/2  dose PRN      . vitamin C (ASCORBIC ACID) 500 MG tablet Take 250 mg by mouth daily.       Marland Kitchen LORazepam (ATIVAN) 0.5 MG tablet Take 1 tablet (0.5 mg total) by mouth as directed. Take 0.73m 30 minutes prior to MRI  1 tablet  0  . Multiple Vitamin (MULTIVITAMIN) tablet Take 1 tablet by mouth daily.        . prochlorperazine (COMPAZINE) 10 MG tablet Take 1 tablet (10 mg total) by mouth every 6 (six) hours as needed for nausea or vomiting.  60 tablet  0   No current facility-administered medications for this visit.    SURGICAL HISTORY:  Past Surgical History  Procedure Laterality Date  . Colonoscopy  2004  . Colonoscopy  2014    tubular adenoma x1, mod diverticulosis ([facility])  . Flexible bronchoscopy  02/2014    WNL  . Video bronchoscopy Bilateral 03/17/2014    Procedure: VIDEO BRONCHOSCOPY WITH FLUORO;  Surgeon: MTanda Rockers MD;  Location: [facility];  Service: Cardiopulmonary;  Laterality: Bilateral;    REVIEW OF SYSTEMS:  Constitutional: positive for fatigue Eyes: negative Ears, nose, mouth, throat, and face: negative Respiratory: positive for dyspnea on exertion Cardiovascular: negative Gastrointestinal: positive for constipation Genitourinary:negative Integument/breast: positive for rash Hematologic/lymphatic: negative Musculoskeletal:negative Neurological: negative Behavioral/Psych: negative Endocrine: negative Allergic/Immunologic: negative   PHYSICAL EXAMINATION: General appearance: alert, cooperative, fatigued and no distress Head: Normocephalic, without obvious abnormality, atraumatic Neck: no adenopathy, no JVD, supple, symmetrical, trachea midline and thyroid not enlarged, symmetric, no tenderness/mass/nodules Lymph nodes: Cervical, supraclavicular, and axillary nodes normal. Resp: diminished breath sounds LLL and dullness to percussion LLL Back: symmetric, no curvature. ROM normal. No CVA tenderness. Cardio: regular rate and rhythm, S1, S2 normal, no murmur,  click, rub or gallop GI: soft, non-tender; bowel sounds normal; no masses,  no organomegaly Extremities: extremities normal, atraumatic, no cyanosis or edema Neurologic: Alert and oriented X 3, normal strength and tone. Normal symmetric reflexes. Normal coordination and gait Mouth: Clear, no evidence of thrush or mucositis Skin: Erythematous to and macular lesions scattered over the lower extremities and bilateral forearms in the sun exposed areas  ECOG PERFORMANCE STATUS: 1 - Symptomatic but completely ambulatory  Blood pressure 147/81, pulse 73, temperature 98.7 F (37.1 C), temperature source Oral, resp. rate 18, height 5' 9"  (1.753 m), weight 178 lb 12.8 oz (81.103 kg), SpO2 99.00%.  LABORATORY DATA: Lab Results  Component Value Date   WBC 5.9 05/25/2014   HGB 15.0 05/25/2014   HCT 43.7 05/25/2014   MCV 93.4 05/25/2014   PLT 237 05/25/2014      Chemistry      Component Value Date/Time   NA 140 05/25/2014 0852  NA 141 03/09/2014 0834   K 4.5 05/25/2014 0852   K 4.5 03/09/2014 0834   CL 104 03/09/2014 0834   CO2 21* 05/25/2014 0852   CO2 30 03/09/2014 0834   BUN 17.7 05/25/2014 0852   BUN 15 03/09/2014 0834   CREATININE 0.9 05/25/2014 0852   CREATININE 0.8 03/09/2014 0834   CREATININE 0.83 02/22/2014 1314      Component Value Date/Time   CALCIUM 9.1 05/25/2014 0852   CALCIUM 8.7 03/09/2014 0834   ALKPHOS 73 05/25/2014 0852   ALKPHOS 55 03/09/2014 0834   AST 12 05/25/2014 0852   AST 20 03/09/2014 0834   ALT 18 05/25/2014 0852   ALT 21 03/09/2014 0834   BILITOT 0.69 05/25/2014 0852   BILITOT 1.2 03/09/2014 0834       RADIOGRAPHIC STUDIES: Dg Eye Foreign Body  04/01/2014   CLINICAL DATA:  Metal working/exposure; clearance prior to MRI  EXAM: ORBITS FOR FOREIGN BODY - 2 VIEW  COMPARISON:  None.  FINDINGS: There is no evidence of metallic foreign body within the orbits. No significant bone abnormality identified.  IMPRESSION: No evidence of metallic foreign body within the orbits.   Electronically Signed    By: Rozetta Nunnery M.D.   On: 04/01/2014 11:23   Dg Chest 2 View  03/17/2014   CLINICAL DATA:  Abnormal chest CT.  Cough.  Pre bronchoscopy.  EXAM: CHEST  2 VIEW  COMPARISON:  CT chest February 23, 2014  FINDINGS: The heart size and mediastinal contours are within normal limits. There is diffuse increased pulmonary interstitium and ground-glass opacity throughout the left upper and left lower lobes. There is minimal left pleural effusion. The right lung is clear. Degenerative joint changes of the spine are noted.  IMPRESSION: Diffuse increased pulmonary interstitium and ground-glass opacity throughout the left upper and lower lobes which were seen on prior CT. Minimal left pleural effusion.   Electronically Signed   By: Abelardo Diesel M.D.   On: 03/17/2014 08:25   Mr Jeri Cos LN Contrast  04/01/2014   CLINICAL DATA:  Adenocarcinoma of lung. Evaluate for intracranial metastatic disease.  EXAM: MRI HEAD WITHOUT AND WITH CONTRAST  TECHNIQUE: Multiplanar, multiecho pulse sequences of the brain and surrounding structures were obtained without and with intravenous contrast.  CONTRAST:  44m MULTIHANCE GADOBENATE DIMEGLUMINE 529 MG/ML IV SOLN  COMPARISON:  PET scan performed 03/30/2014.  FINDINGS: No evidence for acute infarction, hemorrhage, mass lesion, hydrocephalus, or extra-axial fluid. Mild to moderate cerebral and cerebellar atrophy. Prominent perivascular spaces. Chronic microvascular ischemic change of a moderate degree in the periventricular and subcortical white matter. 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  Post infusion, no abnormal enhancement of the brain or meninges. Pituitary, pineal, and cerebellar tonsils unremarkable. No upper cervical lesions. Flow voids are maintained throughout the carotid, basilar, and vertebral arteries. There are no areas of chronic hemorrhage. Visualized calvarium, skull base, and upper cervical osseous structures unremarkable. Scalp and extracranial  soft tissues, orbits, sinuses, and mastoids show no acute process.  IMPRESSION: Atrophy, small vessel disease, and remote lacunar infarcts. No acute intracranial findings.  No visible intracranial metastatic disease.   Electronically Signed   By: JRolla FlattenM.D.   On: 04/01/2014 15:51   Nm Pet Image Initial (pi) Skull Base To Thigh  03/30/2014   CLINICAL DATA:  Initial treatment strategy for Lung cancer.  EXAM: NUCLEAR MEDICINE PET SKULL BASE TO THIGH  TECHNIQUE: 9.6 mCi F-18 FDG was injected intravenously. Full-ring PET imaging  was performed from the skull base to thigh after the radiotracer. CT data was obtained and used for attenuation correction and anatomic localization.  FASTING BLOOD GLUCOSE:  Value: 141 mg/dl  COMPARISON:  CT 02/23/2014.  Marland Kitchen  FINDINGS: NECK  Hypermetabolic left level 4 and level 5 lymph nodes are identified. Index lymph node within the left level 4 region has an SUV max equal to 3.5. Also in the left level 4 region there is a hypermetabolic lymph node with an SUV max equal to 3.8.  CHEST  The heart size is normal. There is no pericardial effusion. Multiple hypermetabolic mediastinal and bilateral hilar lymph nodes are identified. Index high right paratracheal lymph node has an SUV max equal to 5.8. Hypermetabolic sub- carinal lymph node has an SUV max equal to 6.3. The right hilar lymph node has an SUV max equal to 3.9. The left hilar lymph node has an SUV max equal to 6.3.  Mild changes of centrilobular emphysema noted. There is a small nodule within the right lower lobe which measures 6 mm, image 89. This is too small to characterize by PET-CT. Several tiny nodules within the right upper lobe are better seen on the diagnostic chest CT from 02/23/2014. These are also too small to characterize by PET-CT. There is a moderate volume left pleural effusion. Pulmonary nodule within the left lower lobe has an SUV max equal 5.1. There is diffuse interlobular septal thickening within the left  upper lobe and left lower lobe which concerning for lymphangitic spread of tumor.  ABDOMEN/PELVIS  4.3 cm mass within the right hepatic lobe is noted. There is a minimal increase in activity associated with this mass above background activity. The SUV max is equal to 3.3 (versus 2.9 background). No abnormal hypermetabolic activity within the liver, pancreas, adrenal glands, or spleen. No hypermetabolic lymph nodes in the abdomen or pelvis.  SKELETON  No focal hypermetabolic activity to suggest skeletal metastasis.  IMPRESSION: 1. Findings compatible with primary bronchogenic carcinoma involving the left lung. There is evidence of lymphangitic spread throughout the left lung. 2. Hypermetabolic bilateral hilar and bilateral mediastinal lymph nodes. There also hypermetabolic left-sided level 5 and level 4 cervical lymph nodes worrisome for metastatic adenopathy. 3. Left pleural effusion. 4. Right hepatic lobe mass is indeterminate. Only mild increased tracer uptake is identified within this mass above background activity. Because of the obvious staging implications, consider further evaluation with percutaneous biopsy.   Electronically Signed   By: Kerby Moors M.D.   On: 03/30/2014 10:32   Dg Chest Port 1 View  03/17/2014   CLINICAL DATA:  Left upper lobe bronchoscopy and biopsies.  EXAM: PORTABLE CHEST - 1 VIEW  COMPARISON:  03/17/2014 at 7:03 a.m.  FINDINGS: Normal heart size and mediastinal contours. Increased opacification in the left lung diffusely, but most dense in the upper lobe. There is a left pleural effusion which is small and without evidence of increase. No pneumothorax. Clear right lung.  IMPRESSION: 1. Increased airspace opacification on the left, which could represent alveolar hemorrhage or bronchoscopy lavage. 2. No pneumothorax.   Electronically Signed   By: Jorje Guild M.D.   On: 03/17/2014 08:59   Dg C-arm Bronchoscopy  03/17/2014   CLINICAL DATA: bronch procedure   C-ARM BRONCHOSCOPY   Fluoroscopy was utilized by the requesting physician.  No radiographic  interpretation.     ASSESSMENT AND PLAN: this is a very pleasant 73 years old white male with stage IV non-small cell lung cancer, adenocarcinoma presenting  with large right upper lobe lung mass in addition to mediastinal and bilateral hilar lymphadenopathy as well as cervical lymphadenopathy and left pleural effusion. The molecular biomarker testing sent to [facility] was unable to be processed due to insufficient material. His MRI of the brain showed no evidence for metastatic disease to the brain and PET scan showed the liver lesion still indeterminate but less likely to be malignant. The patient is being treated with systemic chemotherapy in the form of carboplatin for AUC of 5 and Alimta 500 mg/M2 every 3 weeks, status post 2 cycles.  The patient was discussed with also seen by Dr. [name]. 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 He will continue with weekly labs consisting of a CBC differential and C. met. He'll proceed to cycle #3 of his systemic chemotherapy with carboplatin and Alimta today as scheduled. He'll follow with Dr. [name] in 3 weeks with restaging CT scan of his chest, abdomen and pelvis with contrast to reevaluate his disease.   He was advised to call immediately if he has any concerning symptoms in the interval.  The patient voices understanding of current disease status and treatment options and is in agreement with the current care plan.  All questions were answered. The patient knows to call the clinic with any problems, questions or concerns. We can certainly see the patient much sooner if necessary.  Carlton Adam, PA-C   Disclaimer: This note was dictated with voice recognition software. Similar sounding words  can inadvertently be transcribed and may not be corrected upon review. Carlton Adam, PA-C 05/25/2014  ADDENDUM:  Hematology/Oncology Attending:  I had a face to face encounter with the patient. I recommended his care plan.  This is a very pleasant 25 yearsod white male recently diagnosed with stage IV non-small cell lung cancer, adenocarcinoma. He is currently undergoing systemic chemotherapy with carboplatin and Alimta status post 2 cycles. He is tolerating his treatment fairly well with no significant adverse effects except for mild fatigue. The molecular studies from the blood test performed by [facility] showed no evidence for EGFR mutation or ALK gene translocation. I discussed the results with the patient today. I recommended for him to continue his current treatment with carboplatin and Alimta as scheduled. He would have repeat CT scan of the chest, abdomen and pelvis performed in 3 weeks for evaluation of his disease before starting cycle #4. He was advised to call immediately if he has any concerning symptoms in the interval.  Disclaimer: This note was dictated with voice recognition software. Similar sounding words can inadvertently be transcribed and may not be corrected upon review. Curt Bears, MD 05/27/2014

## 2014-05-25 NOTE — Telephone Encounter (Signed)
gv adn printed appt sched an davs for pt for June and July....Marland Kitchensed added tx.

## 2014-05-25 NOTE — Patient Instructions (Signed)
[facility] Discharge Instructions for Patients Receiving Chemotherapy  Today you received the following chemotherapy agents :  Alimta,  Carboplatin,  Vitamin B12 injection.  To help prevent nausea and vomiting after your treatment, we encourage you to take your nausea medication as prescribed by your physician.  Take Compazine 10mg  by mouth every 6 hours as needed for nausea.  DO NOT Drive after taking this med as it can cause drowsiness.   If you develop nausea and vomiting that is not controlled by your nausea medication, call the clinic.   BELOW ARE SYMPTOMS THAT SHOULD BE REPORTED IMMEDIATELY:  *FEVER GREATER THAN 100.5 F  *CHILLS WITH OR WITHOUT FEVER  NAUSEA AND VOMITING THAT IS NOT CONTROLLED WITH YOUR NAUSEA MEDICATION  *UNUSUAL SHORTNESS OF BREATH  *UNUSUAL BRUISING OR BLEEDING  TENDERNESS IN MOUTH AND THROAT WITH OR WITHOUT PRESENCE OF ULCERS  *URINARY PROBLEMS  *BOWEL PROBLEMS  UNUSUAL RASH Items with * indicate a potential emergency and should be followed up as soon as possible.  Feel free to call the clinic you have any questions or concerns. The clinic phone number is [phone].

## 2014-05-26 NOTE — Patient Instructions (Signed)
Continue weekly labs as scheduled Followup in 3 weeks with restaging CT scan of the chest, abdomen and pelvis to reevaluate her disease

## 2014-06-01 ENCOUNTER — Telehealth: Payer: Self-pay | Admitting: *Deleted

## 2014-06-01 ENCOUNTER — Other Ambulatory Visit: Payer: Medicare Other

## 2014-06-01 DIAGNOSIS — C3490 Malignant neoplasm of unspecified part of unspecified bronchus or lung: Secondary | ICD-10-CM

## 2014-06-01 DIAGNOSIS — R1310 Dysphagia, unspecified: Secondary | ICD-10-CM

## 2014-06-01 MED ORDER — MAGIC MOUTHWASH W/LIDOCAINE: 5.0000 mL | Freq: Three times a day (TID) | ORAL | Status: DC | PRN

## 2014-06-01 MED ORDER — SUCRALFATE 1 GM/10ML PO SUSP: 1.0000 g | Freq: Three times a day (TID) | ORAL | Status: DC

## 2014-06-01 NOTE — Telephone Encounter (Signed)
   Provider input needed: Dysphagia, cough   Reason for call: Dysphagia and cough  Ears, nose, mouth, throat, and face: positive for sore throat and dysphagia for 2 days. Trouble swallowing pills "get stuck" and painful to swallow water. Respiratory: positive for worsening cough, sputum, with excessive amounts of thick foamy phlegm.  Has attempted to give him OTC prevacid, but he refuses. Has no pain medication and doubts he would take it. Taking the Mucinex as was instructed, but getting very difficult to swallow this now.  ALLERGIES:  is allergic to candesartan cilexetil; diltiazem hcl; doxazosin mesylate; nifedipine; pravastatin; red yeast rice; rosuvastatin; and simvastatin.  Patient last received chemotherapy/ treatment on 05/25/14-Carboplatin & Alimta  Patient was last seen in the office on 05/25/14   Next appt is 06/15/14 with lab on 06/03/14  Is patient having fevers greater than 100.5?  no, temp 98.2    Is patient having uncontrolled pain, or new pain? yes, throat, just below adam's apple    Is patient having new back pain that changes with position (worsens or eases when laying down?)  No  Is patient able to eat and drink? yes, with pain    Is patient able to pass stool without difficulty?   yes     Is patient having uncontrolled nausea?  no    family calls 06/01/2014 with complaint of  see above   Summary Based on the above information advised family to  Avoid extremes of temp of food & liquids and acidic/rough foods. Gargle with salt water/baking soda. Take Tylenol for pain. Concentrate on fluids and very soft foods. Will inquire with provider for further orders.   Tania Ade  06/01/2014, 10:31 AM   Background Info  Shawn Espinoza   DOB: [birth year]   MR#: [id number]   CSN#   [id number] 06/01/2014

## 2014-06-01 NOTE — Telephone Encounter (Signed)
Called wife back with Carafate suspension and MMW directions. Also instructed to resume Mucinex when he can swallow better and push po fluids-it works better when you are hydrated. Use liquid Tylenol for pain. Ask to speak with triage when he comes in for labs on 6/12 if if not better, the NP, Burns Spain will see him as a work-in. Wife is appreciative of help.

## 2014-06-01 NOTE — Telephone Encounter (Signed)
Reviewed RN's notes below; - Pt with lung cancer; called w/ new complaint of cough caused by excess phlegm and dysphagia. Patient has appt next on 6/24. Pt is s/p chemo w/ Carbo/ Alimta.  Assessment/ Plan: 1. Dysphagia - will Rx carafate & magic mouthwash with lidocaine; instruct patient to used TID to assist with swallowing. Pt to call us if symptoms worsen or do not improve. 2. Cough - once patient is better able to swallow, recommend patient resume taking Mucinex, and push fluids.  3. Lung Cancer -  Follow up as previously scheduled on 06/15/2014. If patient does not see relief with above measures, we are happy to work him in to be seen sooner.   Burns Spain, AOCNP, NP-C

## 2014-06-02 LAB — GUARDANT 360

## 2014-06-03 ENCOUNTER — Other Ambulatory Visit (HOSPITAL_BASED_OUTPATIENT_CLINIC_OR_DEPARTMENT_OTHER): Payer: Medicare Other

## 2014-06-03 DIAGNOSIS — C3490 Malignant neoplasm of unspecified part of unspecified bronchus or lung: Secondary | ICD-10-CM

## 2014-06-03 DIAGNOSIS — C3410 Malignant neoplasm of upper lobe, unspecified bronchus or lung: Secondary | ICD-10-CM

## 2014-06-03 LAB — COMPREHENSIVE METABOLIC PANEL (CC13)
ALBUMIN: 3.6 g/dL (ref 3.5–5.0)
ALT: 19 U/L (ref 0–55)
ANION GAP: 8 meq/L (ref 3–11)
AST: 16 U/L (ref 5–34)
Alkaline Phosphatase: 70 U/L (ref 40–150)
BILIRUBIN TOTAL: 0.96 mg/dL (ref 0.20–1.20)
BUN: 20.1 mg/dL (ref 7.0–26.0)
CO2: 26 mEq/L (ref 22–29)
Calcium: 8.8 mg/dL (ref 8.4–10.4)
Chloride: 106 mEq/L (ref 98–109)
Creatinine: 0.8 mg/dL (ref 0.7–1.3)
GLUCOSE: 186 mg/dL — AB (ref 70–140)
POTASSIUM: 4.5 meq/L (ref 3.5–5.1)
SODIUM: 140 meq/L (ref 136–145)
TOTAL PROTEIN: 6 g/dL — AB (ref 6.4–8.3)

## 2014-06-03 LAB — CBC WITH DIFFERENTIAL/PLATELET
BASO%: 0.5 % (ref 0.0–2.0)
Basophils Absolute: 0 10*3/uL (ref 0.0–0.1)
EOS ABS: 0 10*3/uL (ref 0.0–0.5)
EOS%: 1.1 % (ref 0.0–7.0)
HEMATOCRIT: 39.4 % (ref 38.4–49.9)
HGB: 13.8 g/dL (ref 13.0–17.1)
LYMPH%: 36.7 % (ref 14.0–49.0)
MCH: 31.9 pg (ref 27.2–33.4)
MCHC: 35 g/dL (ref 32.0–36.0)
MCV: 91 fL (ref 79.3–98.0)
MONO#: 0.4 10*3/uL (ref 0.1–0.9)
MONO%: 20.7 % — ABNORMAL HIGH (ref 0.0–14.0)
NEUT#: 0.8 10*3/uL — ABNORMAL LOW (ref 1.5–6.5)
NEUT%: 41 % (ref 39.0–75.0)
RBC: 4.33 10*6/uL (ref 4.20–5.82)
RDW: 14.1 % (ref 11.0–14.6)
WBC: 1.9 10*3/uL — AB (ref 4.0–10.3)
lymph#: 0.7 10*3/uL — ABNORMAL LOW (ref 0.9–3.3)

## 2014-06-08 ENCOUNTER — Ambulatory Visit (HOSPITAL_COMMUNITY)
Admission: RE | Admit: 2014-06-08 | Discharge: 2014-06-08 | Disposition: A | Discharge status: Home / Self Care | Payer: Medicare Other | Source: Ambulatory Visit | Attending: Physician Assistant | Admitting: Physician Assistant

## 2014-06-08 ENCOUNTER — Encounter (HOSPITAL_COMMUNITY): Payer: Self-pay

## 2014-06-08 ENCOUNTER — Other Ambulatory Visit (HOSPITAL_BASED_OUTPATIENT_CLINIC_OR_DEPARTMENT_OTHER): Payer: Medicare Other

## 2014-06-08 DIAGNOSIS — N400 Enlarged prostate without lower urinary tract symptoms: Secondary | ICD-10-CM | POA: Insufficient documentation

## 2014-06-08 DIAGNOSIS — J90 Pleural effusion, not elsewhere classified: Secondary | ICD-10-CM | POA: Insufficient documentation

## 2014-06-08 DIAGNOSIS — R1310 Dysphagia, unspecified: Secondary | ICD-10-CM | POA: Insufficient documentation

## 2014-06-08 DIAGNOSIS — R0602 Shortness of breath: Secondary | ICD-10-CM | POA: Insufficient documentation

## 2014-06-08 DIAGNOSIS — Z9221 Personal history of antineoplastic chemotherapy: Secondary | ICD-10-CM | POA: Insufficient documentation

## 2014-06-08 DIAGNOSIS — C3410 Malignant neoplasm of upper lobe, unspecified bronchus or lung: Secondary | ICD-10-CM

## 2014-06-08 DIAGNOSIS — R599 Enlarged lymph nodes, unspecified: Secondary | ICD-10-CM | POA: Insufficient documentation

## 2014-06-08 DIAGNOSIS — K7689 Other specified diseases of liver: Secondary | ICD-10-CM | POA: Insufficient documentation

## 2014-06-08 DIAGNOSIS — C3490 Malignant neoplasm of unspecified part of unspecified bronchus or lung: Secondary | ICD-10-CM | POA: Insufficient documentation

## 2014-06-08 LAB — COMPREHENSIVE METABOLIC PANEL (CC13)
ALT: 16 U/L (ref 0–55)
ANION GAP: 8 meq/L (ref 3–11)
AST: 15 U/L (ref 5–34)
Albumin: 3.4 g/dL — ABNORMAL LOW (ref 3.5–5.0)
Alkaline Phosphatase: 72 U/L (ref 40–150)
BUN: 19.1 mg/dL (ref 7.0–26.0)
CALCIUM: 8.8 mg/dL (ref 8.4–10.4)
CHLORIDE: 106 meq/L (ref 98–109)
CO2: 28 meq/L (ref 22–29)
Creatinine: 1 mg/dL (ref 0.7–1.3)
Glucose: 221 mg/dl — ABNORMAL HIGH (ref 70–140)
Potassium: 4.6 mEq/L (ref 3.5–5.1)
SODIUM: 142 meq/L (ref 136–145)
Total Bilirubin: 0.67 mg/dL (ref 0.20–1.20)
Total Protein: 5.8 g/dL — ABNORMAL LOW (ref 6.4–8.3)

## 2014-06-08 LAB — CBC WITH DIFFERENTIAL/PLATELET
BASO%: 0.4 % (ref 0.0–2.0)
Basophils Absolute: 0 10*3/uL (ref 0.0–0.1)
EOS%: 1.4 % (ref 0.0–7.0)
Eosinophils Absolute: 0 10*3/uL (ref 0.0–0.5)
HCT: 39.6 % (ref 38.4–49.9)
HGB: 13.2 g/dL (ref 13.0–17.1)
LYMPH#: 0.5 10*3/uL — AB (ref 0.9–3.3)
LYMPH%: 15.5 % (ref 14.0–49.0)
MCH: 31.6 pg (ref 27.2–33.4)
MCHC: 33.4 g/dL (ref 32.0–36.0)
MCV: 94.8 fL (ref 79.3–98.0)
MONO#: 0.5 10*3/uL (ref 0.1–0.9)
MONO%: 13.7 % (ref 0.0–14.0)
NEUT#: 2.3 10*3/uL (ref 1.5–6.5)
NEUT%: 69 % (ref 39.0–75.0)
Platelets: 73 10*3/uL — ABNORMAL LOW (ref 140–400)
RBC: 4.18 10*6/uL — AB (ref 4.20–5.82)
RDW: 15.6 % — ABNORMAL HIGH (ref 11.0–14.6)
WBC: 3.3 10*3/uL — AB (ref 4.0–10.3)

## 2014-06-08 MED ORDER — IOHEXOL 300 MG/ML  SOLN
100.0000 mL | Freq: Once | INTRAMUSCULAR | Status: AC | PRN
  Administered 2014-06-08: 100 mL via INTRAVENOUS

## 2014-06-10 ENCOUNTER — Ambulatory Visit (HOSPITAL_COMMUNITY): Payer: Medicare Other

## 2014-06-15 ENCOUNTER — Other Ambulatory Visit (HOSPITAL_BASED_OUTPATIENT_CLINIC_OR_DEPARTMENT_OTHER): Payer: Medicare Other

## 2014-06-15 ENCOUNTER — Ambulatory Visit (HOSPITAL_BASED_OUTPATIENT_CLINIC_OR_DEPARTMENT_OTHER): Payer: Medicare Other

## 2014-06-15 ENCOUNTER — Ambulatory Visit (HOSPITAL_BASED_OUTPATIENT_CLINIC_OR_DEPARTMENT_OTHER): Payer: Medicare Other | Admitting: Physician Assistant

## 2014-06-15 ENCOUNTER — Encounter: Payer: Self-pay | Admitting: Physician Assistant

## 2014-06-15 ENCOUNTER — Encounter: Payer: Self-pay | Admitting: Internal Medicine

## 2014-06-15 ENCOUNTER — Telehealth: Payer: Self-pay | Admitting: Internal Medicine

## 2014-06-15 VITALS — BP 152/73 | HR 77 | Temp 97.5°F | Resp 18 | Ht 69.0 in | Wt 185.0 lb

## 2014-06-15 DIAGNOSIS — C50919 Malignant neoplasm of unspecified site of unspecified female breast: Secondary | ICD-10-CM

## 2014-06-15 DIAGNOSIS — C779 Secondary and unspecified malignant neoplasm of lymph node, unspecified: Secondary | ICD-10-CM

## 2014-06-15 DIAGNOSIS — Z5111 Encounter for antineoplastic chemotherapy: Secondary | ICD-10-CM

## 2014-06-15 DIAGNOSIS — C3490 Malignant neoplasm of unspecified part of unspecified bronchus or lung: Secondary | ICD-10-CM

## 2014-06-15 DIAGNOSIS — R7309 Other abnormal glucose: Secondary | ICD-10-CM

## 2014-06-15 DIAGNOSIS — C3410 Malignant neoplasm of upper lobe, unspecified bronchus or lung: Secondary | ICD-10-CM

## 2014-06-15 LAB — COMPREHENSIVE METABOLIC PANEL (CC13)
ALBUMIN: 3.6 g/dL (ref 3.5–5.0)
ALT: 16 U/L (ref 0–55)
AST: 12 U/L (ref 5–34)
Alkaline Phosphatase: 73 U/L (ref 40–150)
Anion Gap: 10 mEq/L (ref 3–11)
BUN: 18.7 mg/dL (ref 7.0–26.0)
CALCIUM: 9 mg/dL (ref 8.4–10.4)
CHLORIDE: 102 meq/L (ref 98–109)
CO2: 25 mEq/L (ref 22–29)
Creatinine: 0.9 mg/dL (ref 0.7–1.3)
Glucose: 324 mg/dl — ABNORMAL HIGH (ref 70–140)
POTASSIUM: 4.7 meq/L (ref 3.5–5.1)
Sodium: 137 mEq/L (ref 136–145)
Total Bilirubin: 0.71 mg/dL (ref 0.20–1.20)
Total Protein: 6.2 g/dL — ABNORMAL LOW (ref 6.4–8.3)

## 2014-06-15 LAB — CBC WITH DIFFERENTIAL/PLATELET
BASO%: 0.3 % (ref 0.0–2.0)
BASOS ABS: 0 10*3/uL (ref 0.0–0.1)
EOS%: 0 % (ref 0.0–7.0)
Eosinophils Absolute: 0 10*3/uL (ref 0.0–0.5)
HCT: 40.4 % (ref 38.4–49.9)
HEMOGLOBIN: 14 g/dL (ref 13.0–17.1)
LYMPH#: 0.5 10*3/uL — AB (ref 0.9–3.3)
LYMPH%: 7.9 % — ABNORMAL LOW (ref 14.0–49.0)
MCH: 33.3 pg (ref 27.2–33.4)
MCHC: 34.8 g/dL (ref 32.0–36.0)
MCV: 95.8 fL (ref 79.3–98.0)
MONO#: 0.4 10*3/uL (ref 0.1–0.9)
MONO%: 7 % (ref 0.0–14.0)
NEUT#: 5 10*3/uL (ref 1.5–6.5)
NEUT%: 84.8 % — ABNORMAL HIGH (ref 39.0–75.0)
Platelets: 237 10*3/uL (ref 140–400)
RBC: 4.21 10*6/uL (ref 4.20–5.82)
RDW: 16.8 % — AB (ref 11.0–14.6)
WBC: 5.9 10*3/uL (ref 4.0–10.3)

## 2014-06-15 MED ORDER — SODIUM CHLORIDE 0.9 % IV SOLN
500.0000 mg/m2 | Freq: Once | INTRAVENOUS | Status: AC
  Administered 2014-06-15: 1000 mg via INTRAVENOUS
  Filled 2014-06-15: qty 40

## 2014-06-15 MED ORDER — ALBUTEROL SULFATE HFA 108 (90 BASE) MCG/ACT IN AERS: 2.0000 | INHALATION_SPRAY | Freq: Four times a day (QID) | RESPIRATORY_TRACT | Status: DC | PRN

## 2014-06-15 MED ORDER — ONDANSETRON 16 MG/50ML IVPB (CHCC)
16.0000 mg | Freq: Once | INTRAVENOUS | Status: AC
  Administered 2014-06-15: 16 mg via INTRAVENOUS

## 2014-06-15 MED ORDER — SODIUM CHLORIDE 0.9 % IV SOLN
Freq: Once | INTRAVENOUS | Status: AC
  Administered 2014-06-15: 10:00:00 via INTRAVENOUS

## 2014-06-15 MED ORDER — DEXAMETHASONE SODIUM PHOSPHATE 20 MG/5ML IJ SOLN
20.0000 mg | Freq: Once | INTRAMUSCULAR | Status: AC
  Administered 2014-06-15: 20 mg via INTRAVENOUS

## 2014-06-15 MED ORDER — SODIUM CHLORIDE 0.9 % IV SOLN
500.0000 mg | Freq: Once | INTRAVENOUS | Status: AC
  Administered 2014-06-15: 500 mg via INTRAVENOUS
  Filled 2014-06-15: qty 50

## 2014-06-15 MED ORDER — ONDANSETRON 16 MG/50ML IVPB (CHCC)
INTRAVENOUS | Status: AC
  Filled 2014-06-15: qty 16

## 2014-06-15 MED ORDER — DEXAMETHASONE SODIUM PHOSPHATE 20 MG/5ML IJ SOLN
INTRAMUSCULAR | Status: AC
  Filled 2014-06-15: qty 5

## 2014-06-15 MED ORDER — INSULIN REGULAR HUMAN 100 UNIT/ML IJ SOLN
10.0000 [IU] | Freq: Once | INTRAMUSCULAR | Status: AC
  Administered 2014-06-15: 10 [IU] via SUBCUTANEOUS
  Filled 2014-06-15: qty 0.1

## 2014-06-15 NOTE — Patient Instructions (Signed)
[facility] Discharge Instructions for Patients Receiving Chemotherapy  Today you received the following chemotherapy agents: Alimta and Carboplatin   To help prevent nausea and vomiting after your treatment, we encourage you to take your nausea medication as prescribed.    If you develop nausea and vomiting that is not controlled by your nausea medication, call the clinic.   BELOW ARE SYMPTOMS THAT SHOULD BE REPORTED IMMEDIATELY:  *FEVER GREATER THAN 100.5 F  *CHILLS WITH OR WITHOUT FEVER  NAUSEA AND VOMITING THAT IS NOT CONTROLLED WITH YOUR NAUSEA MEDICATION  *UNUSUAL SHORTNESS OF BREATH  *UNUSUAL BRUISING OR BLEEDING  TENDERNESS IN MOUTH AND THROAT WITH OR WITHOUT PRESENCE OF ULCERS  *URINARY PROBLEMS  *BOWEL PROBLEMS  UNUSUAL RASH Items with * indicate a potential emergency and should be followed up as soon as possible.  Feel free to call the clinic you have any questions or concerns. The clinic phone number is [phone].

## 2014-06-15 NOTE — Progress Notes (Signed)
Glucose 324 today on lab results and protocol orders 20 mg decadron. Pt reports he is not on any medication to regulate his blood glucose and controls it with dietary modifications. Dr. [name] notified of this. Per MD, pt to receive 10 units regular insulin now. Patient informed to call PCP to follow up with blood glucose levels. He states verbal understanding. Pt refuses nutrition consult with [facility] at this time.

## 2014-06-15 NOTE — Patient Instructions (Signed)
Your restaging CT scan revealed improvement in your disease with decrease in the size of your tumor burden. Continue labs and chemotherapy as scheduled Followup in 3 weeks prior to start the next cycle of chemotherapy

## 2014-06-15 NOTE — Telephone Encounter (Signed)
gv and printed appt sched and avs for pt for June/July and Aug...sed added tx.

## 2014-06-15 NOTE — Progress Notes (Addendum)
[name]   Fax:([phone]) [place], [place] [address]  DIAGNOSIS: Stage IIIB/IV non-small cell lung cancer, adenocarcinoma diagnosed in March of 2015. Specimen sent to [facility] for biomarker testing however there was insufficient material to proceed with testing. 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  PRIOR THERAPY: None.  CURRENT THERAPY: systemic chemotherapy with carboplatin for AUC of 5 and Alimta 500 mg/M2 every 3 weeks. First dose given on 04/13/2014. Status post 3 cycles.  INTERVAL HISTORY: Shawn Espinoza 73 y.o. male returns to the clinic today for a follow up visit. He is accompanied by his wife.  He is status post 3 cycles. Overall is tolerating his chemotherapy relatively well. He continues to be active, playing golf. She reports that his secretions are decreased although he continues to take Mucinex and Zyrtec daily. He recently had restaging CT scan of the chest, abdomen and pelvis to reevaluate his disease and presents to discuss results. He also presents to proceed with his next cycle of chemotherapy. He voiced no other specific complaints today. He continues to have shortness of breath at baseline and increased with exertion. He denied having any significant chest pain but continues to have mild cough with no hemoptysis.   MEDICAL HISTORY: Past Medical History  Diagnosis Date  . History of diabetes mellitus, type II     resolved with diet, h/o neuropathy  . Diverticulosis of colon   . Hyperlipidemia   . Hypertension   . Fatty liver 02/29/00    abd ultrasound  . Lower back pain   . Systolic murmur 9728    2Decho - normal LV fxn, EF 55%, mild AS, biatrial enlargement  . History of tobacco use quit 1990s  . Positive  hepatitis C antibody test 2013    HCV RNA negative - ?cleared infection  . 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 Personal history of colonic adenomas 07/06/2013  . Hypertensive retinopathy of both eyes 2015    mild  . nscl ca dx'd 02/2014    ALLERGIES:  is allergic to candesartan cilexetil; diltiazem hcl; doxazosin mesylate; nifedipine; pravastatin; red yeast rice; rosuvastatin; and simvastatin.  MEDICATIONS:  Current Outpatient Prescriptions  Medication Sig Dispense Refill  . Alum & Mag Hydroxide-Simeth (MAGIC MOUTHWASH W/LIDOCAINE) SOLN Take 5 mLs by mouth 3 (three) times daily as needed for mouth pain.  240 mL  0  . amLODipine (NORVASC) 5 MG tablet Take 1 tablet (5 mg total) by mouth daily.  90 tablet  2  . atenolol (TENORMIN) 25 MG tablet Take 1 tablet (25 mg total) by mouth 2 (two) times daily.  180 tablet  2  . cetirizine (ZYRTEC) 10 MG tablet Take 10 mg by mouth daily.      . cholecalciferol (VITAMIN D) 1000 UNITS tablet Take 1,000 Units by mouth daily.      Marland Kitchen dexamethasone (DECADRON) 4 MG tablet 4 mg by mouth twice a day the day before, day of and day after the chemotherapy every 3 weeks.  40 tablet  1  . Digestive Enzymes (PAPAYA AND ENZYMES PO) Take by mouth as needed.        . folic acid (FOLVITE) 1 MG tablet Take 1 tablet (1 mg total) by mouth daily.  30 tablet  4  . glucose blood (ONE TOUCH ULTRA TEST) test strip USE ONE STRIP TO CHECK GLUCOSE ONCE DAILY IN THE MORNING 4 TO 5 DAYS A WEEK Dx:250.00  100 each  3  . guaiFENesin (MUCINEX) 600 MG 12 hr tablet Take by mouth 2 (two) times daily.      Marland Kitchen ibuprofen (ADVIL,MOTRIN) 800 MG tablet Take 1 tablet (800 mg total) by mouth 2 (two) times daily as needed.  40 tablet  1  . Lancets (ONETOUCH ULTRASOFT) lancets Use to check sugar 4-5 times weekly. Dx: 250.00  100 each  3  . methocarbamol (ROBAXIN) 500 MG tablet Take 500 mg by mouth every 8 (eight) hours as needed.      . Milk Thistle 200 MG CAPS Take 1  capsule by mouth 2 (two) times daily.      . Multiple Vitamins-Minerals (EYE VITAMINS PO) 2 (two) times daily. Due to macular degeneration      . Polyethylene Glycol 3350 (MIRALAX PO) Take by mouth as directed. Takes 1/2 dose PRN      . sucralfate (CARAFATE) 1 GM/10ML suspension Take 10 mLs (1 g total) by mouth 4 (four) times daily -  with meals and at bedtime.  420 mL  0  . albuterol (PROVENTIL HFA;VENTOLIN HFA) 108 (90 BASE) MCG/ACT inhaler Inhale 2 puffs into the lungs every 6 (six) hours as needed for wheezing or shortness of breath.  1 Inhaler  2  . LORazepam (ATIVAN) 0.5 MG tablet Take 1 tablet (0.5 mg total) by mouth as directed. Take 0.$RemoveBeforeDE'5mg'[id number]$  30 minutes prior to MRI  1 tablet  0  . prochlorperazine (COMPAZINE) 10 MG tablet Take 1 tablet (10 mg total) by mouth every 6 (six) hours as needed for nausea or vomiting.  60 tablet  0  . vitamin C (ASCORBIC ACID) 500 MG tablet Take 250 mg by mouth daily.        No current facility-administered medications for this visit.    SURGICAL HISTORY:  Past Surgical History  Procedure Laterality Date  . Colonoscopy  2004  . Colonoscopy  2014    tubular adenoma x1, mod diverticulosis ([place]essner)  . Flexible bronchoscopy  02/2014    WNL  . Video bronchoscopy Bilateral 03/17/2014    Procedure: VIDEO BRONCHOSCOPY WITH FLUORO;  Surgeon: Tanda Rockers, MD;  Location: [facility];  Service: Cardiopulmonary;  Laterality: Bilateral;    REVIEW OF SYSTEMS:  Constitutional: positive for fatigue Eyes: negative Ears, nose, mouth, throat, and face: negative Respiratory: positive for dyspnea on exertion and wheezing Cardiovascular: negative Gastrointestinal: positive for constipation Genitourinary:negative Integument/breast: positive for rash Hematologic/lymphatic: negative Musculoskeletal:negative Neurological: negative Behavioral/Psych: negative Endocrine: negative Allergic/Immunologic: negative   PHYSICAL EXAMINATION: General appearance: alert,  cooperative, fatigued and no distress Head: Normocephalic, without obvious abnormality, atraumatic Neck: no adenopathy, no JVD, supple, symmetrical, trachea midline and thyroid not enlarged, symmetric, no tenderness/mass/nodules Lymph nodes: Cervical, supraclavicular, and axillary nodes normal. Resp: diminished breath sounds LLL and dullness to percussion LLL Back: symmetric, no curvature. ROM normal. No CVA tenderness. Cardio: regular rate and rhythm, S1, S2 normal, no murmur, click, rub or gallop GI: soft, non-tender; bowel sounds normal; no masses,  no organomegaly Extremities: extremities normal, atraumatic, no cyanosis or edema Neurologic: Alert and oriented X 3, normal strength and tone. Normal symmetric reflexes. Normal coordination and gait Mouth: Clear, no evidence of thrush or mucositis Skin: Erythematous to and macular lesions scattered over the lower extremities and bilateral forearms in the sun exposed areas -unchanged  ECOG PERFORMANCE STATUS: 1 - Symptomatic but completely ambulatory  Blood pressure 152/73, pulse 77, temperature 97.5 F (36.4 C), temperature source Oral, resp. rate 18, height 5\' 9"  (1.753 m), weight 185 lb (83.915 kg), SpO2 99.00%.  LABORATORY DATA: Lab Results  Component Value Date   WBC 5.9 06/15/2014   HGB 14.0 06/15/2014   HCT 40.4 06/15/2014   MCV 95.8 06/15/2014   PLT 237 06/15/2014      Chemistry      Component Value Date/Time   NA 137 06/15/2014 0849   NA 141 03/09/2014 0834   K 4.7 06/15/2014 0849   K 4.5 03/09/2014 0834   CL 104 03/09/2014 0834   CO2 25 06/15/2014 0849   CO2 30 03/09/2014 0834   BUN 18.7 06/15/2014 0849   BUN 15 03/09/2014 0834   CREATININE 0.9 06/15/2014 0849   CREATININE 0.8 03/09/2014 0834   CREATININE 0.83 02/22/2014 1314      Component Value Date/Time   CALCIUM 9.0 06/15/2014 0849   CALCIUM 8.7 03/09/2014 0834   ALKPHOS 73 06/15/2014 0849   ALKPHOS 55 03/09/2014 0834   AST 12 06/15/2014 0849   AST 20 03/09/2014 0834   ALT 16  06/15/2014 0849   ALT 21 03/09/2014 0834   BILITOT 0.71 06/15/2014 0849   BILITOT 1.2 03/09/2014 0834       RADIOGRAPHIC STUDIES: Ct Chest W Contrast  06/08/2014   CLINICAL DATA:  Non-small cell lung carcinoma. Currently undergoing chemotherapy. Shortness of breath. Dysphagia.  EXAM: CT CHEST, ABDOMEN, AND PELVIS WITH CONTRAST  TECHNIQUE: Multidetector CT imaging of the chest, abdomen and pelvis was performed following the standard protocol during bolus administration of intravenous contrast.  CONTRAST:  06/10/2014 OMNIPAQUE IOHEXOL 300 MG/ML  SOLN  COMPARISON:  PET-CT on 03/30/2014  FINDINGS: CT CHEST FINDINGS  Mediastinal and bilateral hilar lymphadenopathy has decreased since previous study. Index lymph node in the AP window on image 23 measures 10 mm compared with 16 mm on previous study. No Rondell Pardon sites of lymphadenopathy identified within the thorax.  Moderate left pleural effusion shows increase in size, however there is significant improvement seen in mixed interstitial and airspace disease seen in the left upper and lower lobes since prior exam. Decreased size of spiculated nodule seen in the lateral left lower lobe which now measures 11 x 15 mm on image 38 compared with 17 x 20 mm on prior exam. This is consistent with improving lymphangitic spread of carcinoma. 6 mm pulmonary nodule in the posterior right lower lobe on image 38 remains stable. No other right lung nodules or masses identified. No suspicious bone lesions identified.  CT ABDOMEN AND PELVIS FINDINGS  Heterogeneous mass in the central right hepatic lobe shows mild decrease in size, currently measuring 4.2 x 4.8 cm on image 3 of series 5 compared to 4.7 x 5.4 cm previously. No other liver lesions are identified. The adrenal glands, pancreas, spleen, and kidneys are normal in appearance. Shotty less than 1 cm lymph nodes in the porta hepatis and portacaval space are unchanged. No pathologically enlarged nodes seen elsewhere within the abdomen or  pelvis.  Mildly enlarged prostate gland is stable. No evidence of inflammatory process or abnormal fluid collections. Mild diverticulosis is noted, without evidence of diverticulitis. No evidence of dilated bowel loops. No suspicious bone lesions identified.  IMPRESSION: Decreased size of left lower lobe spiculated nodule and lymphangitic spread throughout the left lung.  Decreased hilar and mediastinal lymphadenopathy.  Mild decrease in size of right hepatic  lobe mass.  Mild increase in size of left pleural effusion. No other Clista Rainford or progressive disease identified.   Electronically Signed   By: Earle Gell M.D.   On: 06/08/2014 13:02   Ct Abdomen Pelvis W Contrast  06/08/2014   CLINICAL DATA:  Non-small cell lung carcinoma. Currently undergoing chemotherapy. Shortness of breath. Dysphagia.  EXAM: CT CHEST, ABDOMEN, AND PELVIS WITH CONTRAST  TECHNIQUE: Multidetector CT imaging of the chest, abdomen and pelvis was performed following the standard protocol during bolus administration of intravenous contrast.  CONTRAST:  130m OMNIPAQUE IOHEXOL 300 MG/ML  SOLN  COMPARISON:  PET-CT on 03/30/2014  FINDINGS: CT CHEST FINDINGS  Mediastinal and bilateral hilar lymphadenopathy has decreased since previous study. Index lymph node in the AP window on image 23 measures 10 mm compared with 16 mm on previous study. No Geno Sydnor sites of lymphadenopathy identified within the thorax.  Moderate left pleural effusion shows increase in size, however there is significant improvement seen in mixed interstitial and airspace disease seen in the left upper and lower lobes since prior exam. Decreased size of spiculated nodule seen in the lateral left lower lobe which now measures 11 x 15 mm on image 38 compared with 17 x 20 mm on prior exam. This is consistent with improving lymphangitic spread of carcinoma. 6 mm pulmonary nodule in the posterior right lower lobe on image 38 remains stable. No other right lung nodules or masses identified. No  suspicious bone lesions identified.  CT ABDOMEN AND PELVIS FINDINGS  Heterogeneous mass in the central right hepatic lobe shows mild decrease in size, currently measuring 4.2 x 4.8 cm on image 3 of series 5 compared to 4.7 x 5.4 cm previously. No other liver lesions are identified. The adrenal glands, pancreas, spleen, and kidneys are normal in appearance. Shotty less than 1 cm lymph nodes in the porta hepatis and portacaval space are unchanged. No pathologically enlarged nodes seen elsewhere within the abdomen or pelvis.  Mildly enlarged prostate gland is stable. No evidence of inflammatory process or abnormal fluid collections. Mild diverticulosis is noted, without evidence of diverticulitis. No evidence of dilated bowel loops. No suspicious bone lesions identified.  IMPRESSION: Decreased size of left lower lobe spiculated nodule and lymphangitic spread throughout the left lung.  Decreased hilar and mediastinal lymphadenopathy.  Mild decrease in size of right hepatic lobe mass.  Mild increase in size of left pleural effusion. No other Theodoro Koval or progressive disease identified.   Electronically Signed   By: JEarle GellM.D.   On: 06/08/2014 13:02   ASSESSMENT AND PLAN: this is a very pleasant 73years old white male with stage IV non-small cell lung cancer, adenocarcinoma presenting with large right upper lobe lung mass in addition to mediastinal and bilateral hilar lymphadenopathy as well as cervical lymphadenopathy and left pleural effusion. Molecular biomarker testing was negative for EGFR and ALK. The patient is being treated with systemic chemotherapy in the form of carboplatin for AUC of 5 and Alimta 500 mg/M2 every 3 weeks, status post 3 cycles.  The patient was discussed with also seen by Dr. MJulien Nordmann His restaging CT scan revealed decreased size of the left lower lobe spiculated nodule lymphangitic spread throughout the left lung, there is decreased hilar and mediastinal lymphadenopathy, there is mild  decrease in the size of the right hepatic lobe mass. There is mild increase in the size of left pleural effusion however there were no Annel Zunker or progressive disease identified. These results review of the patient his  wife by both myself and Dr. [name]. Proceed today with cycle #4 of his systemic chemotherapy with carboplatin and Alimta without dose modifications. You will continue with weekly labs as scheduled. He'll followup in 3 weeks prior to this start of cycle #5 repeat CBC differential and C. met.   He was advised to call immediately if he has any concerning symptoms in the interval.  The patient voices understanding of current disease status and treatment options and is in agreement with the current care plan.  All questions were answered. The patient knows to call the clinic with any problems, questions or concerns. We can certainly see the patient much sooner if necessary.  Carlton Adam, PA-C   Disclaimer: This note was dictated with voice recognition software. Similar sounding words can inadvertently be transcribed and may not be corrected upon review. Carlton Adam, PA-C 06/15/2014  ADDENDUM: Hematology/Oncology Attending: I had a face to face encounter with the patient today. I recommended his care plan. This is a very pleasant 73 years old white male recently diagnosed with a stage IV non-small cell lung cancer, adenocarcinoma. He is currently undergoing systemic chemotherapy with carboplatin and Alimta status post 3 cycles. His recent CT scan of the chest, abdomen and pelvis showed evidence for improvement in his disease with decrease in the size of the left lower lobe spiculated nodule as well as decrease in the hilar and mediastinal lymphadenopathy and liver lesions. I discussed the scan results with the patient and his wife. I recommended for him to continue his current systemic chemotherapy with carboplatin and Alimta as scheduled. He'll receive cycle #4 today. He would  come back for followup visit in 3 weeks with the start of cycle #5. The patient was advised to call immediately if he has any concerning symptoms in the interval.  Disclaimer: This note was dictated with voice recognition software. Similar sounding words can inadvertently be transcribed and may not be corrected upon review. Eilleen Kempf., MD 06/15/2014

## 2014-06-20 ENCOUNTER — Telehealth: Payer: Self-pay | Admitting: *Deleted

## 2014-06-20 NOTE — Telephone Encounter (Signed)
Pt's wife called concerned about pt's phlegm.  Called back and spoke to pt, he states the quantity is lower, but they are much thicker, that caused him to choke earlier today.  He is still doing mucinex BID and zyrtec daily.  Per Dr Vista Mink, drink warm liquids and increase fluid intake.  Called and spoke to pt's wife and gave her instructions, she verbalized understanding.  SLJ

## 2014-06-22 ENCOUNTER — Other Ambulatory Visit (HOSPITAL_BASED_OUTPATIENT_CLINIC_OR_DEPARTMENT_OTHER): Payer: Medicare Other

## 2014-06-22 DIAGNOSIS — C3490 Malignant neoplasm of unspecified part of unspecified bronchus or lung: Secondary | ICD-10-CM

## 2014-06-22 DIAGNOSIS — C3410 Malignant neoplasm of upper lobe, unspecified bronchus or lung: Secondary | ICD-10-CM

## 2014-06-22 LAB — COMPREHENSIVE METABOLIC PANEL (CC13)
ALBUMIN: 3.6 g/dL (ref 3.5–5.0)
ALK PHOS: 59 U/L (ref 40–150)
ALT: 21 U/L (ref 0–55)
AST: 17 U/L (ref 5–34)
Anion Gap: 9 mEq/L (ref 3–11)
BUN: 19.6 mg/dL (ref 7.0–26.0)
CO2: 28 mEq/L (ref 22–29)
Calcium: 9 mg/dL (ref 8.4–10.4)
Chloride: 101 mEq/L (ref 98–109)
Creatinine: 1 mg/dL (ref 0.7–1.3)
Glucose: 207 mg/dl — ABNORMAL HIGH (ref 70–140)
POTASSIUM: 4.6 meq/L (ref 3.5–5.1)
SODIUM: 139 meq/L (ref 136–145)
Total Bilirubin: 1.47 mg/dL — ABNORMAL HIGH (ref 0.20–1.20)
Total Protein: 5.9 g/dL — ABNORMAL LOW (ref 6.4–8.3)

## 2014-06-22 LAB — CBC WITH DIFFERENTIAL/PLATELET
BASO%: 1.8 % (ref 0.0–2.0)
BASOS ABS: 0 10*3/uL (ref 0.0–0.1)
EOS%: 1.8 % (ref 0.0–7.0)
Eosinophils Absolute: 0 10*3/uL (ref 0.0–0.5)
HCT: 39.6 % (ref 38.4–49.9)
HGB: 13.3 g/dL (ref 13.0–17.1)
LYMPH%: 32.2 % (ref 14.0–49.0)
MCH: 32.2 pg (ref 27.2–33.4)
MCHC: 33.5 g/dL (ref 32.0–36.0)
MCV: 96.1 fL (ref 79.3–98.0)
MONO#: 0.1 10*3/uL (ref 0.1–0.9)
MONO%: 8.8 % (ref 0.0–14.0)
NEUT#: 0.9 10*3/uL — ABNORMAL LOW (ref 1.5–6.5)
NEUT%: 55.4 % (ref 39.0–75.0)
Platelets: 111 10*3/uL — ABNORMAL LOW (ref 140–400)
RBC: 4.12 10*6/uL — AB (ref 4.20–5.82)
RDW: 16.4 % — AB (ref 11.0–14.6)
WBC: 1.7 10*3/uL — ABNORMAL LOW (ref 4.0–10.3)
lymph#: 0.5 10*3/uL — ABNORMAL LOW (ref 0.9–3.3)

## 2014-06-29 ENCOUNTER — Other Ambulatory Visit (HOSPITAL_BASED_OUTPATIENT_CLINIC_OR_DEPARTMENT_OTHER): Payer: Medicare Other

## 2014-06-29 DIAGNOSIS — C3410 Malignant neoplasm of upper lobe, unspecified bronchus or lung: Secondary | ICD-10-CM

## 2014-06-29 DIAGNOSIS — C3490 Malignant neoplasm of unspecified part of unspecified bronchus or lung: Secondary | ICD-10-CM

## 2014-06-29 LAB — COMPREHENSIVE METABOLIC PANEL (CC13)
ALT: 20 U/L (ref 0–55)
AST: 18 U/L (ref 5–34)
Albumin: 3.7 g/dL (ref 3.5–5.0)
Alkaline Phosphatase: 61 U/L (ref 40–150)
Anion Gap: 8 mEq/L (ref 3–11)
BILIRUBIN TOTAL: 0.96 mg/dL (ref 0.20–1.20)
BUN: 23 mg/dL (ref 7.0–26.0)
CO2: 26 mEq/L (ref 22–29)
Calcium: 8.9 mg/dL (ref 8.4–10.4)
Chloride: 107 mEq/L (ref 98–109)
Creatinine: 0.9 mg/dL (ref 0.7–1.3)
GLUCOSE: 171 mg/dL — AB (ref 70–140)
Potassium: 4.5 mEq/L (ref 3.5–5.1)
SODIUM: 140 meq/L (ref 136–145)
Total Protein: 6 g/dL — ABNORMAL LOW (ref 6.4–8.3)

## 2014-06-29 LAB — CBC WITH DIFFERENTIAL/PLATELET
BASO%: 0.4 % (ref 0.0–2.0)
BASOS ABS: 0 10*3/uL (ref 0.0–0.1)
EOS ABS: 0 10*3/uL (ref 0.0–0.5)
EOS%: 1 % (ref 0.0–7.0)
HCT: 34.9 % — ABNORMAL LOW (ref 38.4–49.9)
HGB: 11.8 g/dL — ABNORMAL LOW (ref 13.0–17.1)
LYMPH%: 26.1 % (ref 14.0–49.0)
MCH: 32.8 pg (ref 27.2–33.4)
MCHC: 33.7 g/dL (ref 32.0–36.0)
MCV: 97.1 fL (ref 79.3–98.0)
MONO#: 0.2 10*3/uL (ref 0.1–0.9)
MONO%: 12.5 % (ref 0.0–14.0)
NEUT%: 60 % (ref 39.0–75.0)
NEUTROS ABS: 1.2 10*3/uL — AB (ref 1.5–6.5)
PLATELETS: 57 10*3/uL — AB (ref 140–400)
RBC: 3.59 10*6/uL — AB (ref 4.20–5.82)
RDW: 16 % — ABNORMAL HIGH (ref 11.0–14.6)
WBC: 2 10*3/uL — ABNORMAL LOW (ref 4.0–10.3)
lymph#: 0.5 10*3/uL — ABNORMAL LOW (ref 0.9–3.3)

## 2014-07-05 ENCOUNTER — Other Ambulatory Visit: Payer: Self-pay | Admitting: Internal Medicine

## 2014-07-06 ENCOUNTER — Ambulatory Visit: Payer: Medicare Other

## 2014-07-06 ENCOUNTER — Telehealth: Payer: Self-pay | Admitting: Internal Medicine

## 2014-07-06 ENCOUNTER — Ambulatory Visit (HOSPITAL_BASED_OUTPATIENT_CLINIC_OR_DEPARTMENT_OTHER): Payer: Medicare Other | Admitting: Physician Assistant

## 2014-07-06 ENCOUNTER — Other Ambulatory Visit: Payer: Medicare Other

## 2014-07-06 ENCOUNTER — Telehealth: Payer: Self-pay | Admitting: *Deleted

## 2014-07-06 ENCOUNTER — Other Ambulatory Visit (HOSPITAL_BASED_OUTPATIENT_CLINIC_OR_DEPARTMENT_OTHER): Payer: Medicare Other

## 2014-07-06 ENCOUNTER — Ambulatory Visit (HOSPITAL_COMMUNITY)
Admission: RE | Admit: 2014-07-06 | Discharge: 2014-07-06 | Disposition: A | Discharge status: Home / Self Care | Payer: Medicare Other | Source: Ambulatory Visit | Attending: Physician Assistant | Admitting: Physician Assistant

## 2014-07-06 ENCOUNTER — Ambulatory Visit (HOSPITAL_BASED_OUTPATIENT_CLINIC_OR_DEPARTMENT_OTHER): Payer: Medicare Other

## 2014-07-06 VITALS — BP 146/72 | HR 70 | Temp 97.8°F | Resp 19 | Ht 69.0 in | Wt 184.5 lb

## 2014-07-06 DIAGNOSIS — Z85118 Personal history of other malignant neoplasm of bronchus and lung: Secondary | ICD-10-CM | POA: Insufficient documentation

## 2014-07-06 DIAGNOSIS — F17200 Nicotine dependence, unspecified, uncomplicated: Secondary | ICD-10-CM | POA: Insufficient documentation

## 2014-07-06 DIAGNOSIS — C3492 Malignant neoplasm of unspecified part of left bronchus or lung: Secondary | ICD-10-CM

## 2014-07-06 DIAGNOSIS — C3410 Malignant neoplasm of upper lobe, unspecified bronchus or lung: Secondary | ICD-10-CM

## 2014-07-06 DIAGNOSIS — C3490 Malignant neoplasm of unspecified part of unspecified bronchus or lung: Secondary | ICD-10-CM

## 2014-07-06 DIAGNOSIS — R0600 Dyspnea, unspecified: Secondary | ICD-10-CM

## 2014-07-06 DIAGNOSIS — Z5111 Encounter for antineoplastic chemotherapy: Secondary | ICD-10-CM

## 2014-07-06 DIAGNOSIS — J90 Pleural effusion, not elsewhere classified: Secondary | ICD-10-CM

## 2014-07-06 LAB — COMPREHENSIVE METABOLIC PANEL (CC13)
ALT: 17 U/L (ref 0–55)
AST: 14 U/L (ref 5–34)
Albumin: 3.8 g/dL (ref 3.5–5.0)
Alkaline Phosphatase: 75 U/L (ref 40–150)
Anion Gap: 11 mEq/L (ref 3–11)
BILIRUBIN TOTAL: 0.82 mg/dL (ref 0.20–1.20)
BUN: 20 mg/dL (ref 7.0–26.0)
CALCIUM: 9.1 mg/dL (ref 8.4–10.4)
CHLORIDE: 103 meq/L (ref 98–109)
CO2: 23 meq/L (ref 22–29)
CREATININE: 1 mg/dL (ref 0.7–1.3)
GLUCOSE: 280 mg/dL — AB (ref 70–140)
Potassium: 4.5 mEq/L (ref 3.5–5.1)
Sodium: 138 mEq/L (ref 136–145)
Total Protein: 6.4 g/dL (ref 6.4–8.3)

## 2014-07-06 LAB — CBC WITH DIFFERENTIAL/PLATELET
BASO%: 0.4 % (ref 0.0–2.0)
Basophils Absolute: 0 10*3/uL (ref 0.0–0.1)
EOS%: 0 % (ref 0.0–7.0)
Eosinophils Absolute: 0 10*3/uL (ref 0.0–0.5)
HEMATOCRIT: 39 % (ref 38.4–49.9)
HGB: 13.2 g/dL (ref 13.0–17.1)
LYMPH#: 0.3 10*3/uL — AB (ref 0.9–3.3)
LYMPH%: 9.1 % — AB (ref 14.0–49.0)
MCH: 33.4 pg (ref 27.2–33.4)
MCHC: 33.9 g/dL (ref 32.0–36.0)
MCV: 98.5 fL — ABNORMAL HIGH (ref 79.3–98.0)
MONO#: 0.3 10*3/uL (ref 0.1–0.9)
MONO%: 9 % (ref 0.0–14.0)
NEUT%: 81.5 % — AB (ref 39.0–75.0)
NEUTROS ABS: 3 10*3/uL (ref 1.5–6.5)
PLATELETS: 228 10*3/uL (ref 140–400)
RBC: 3.96 10*6/uL — ABNORMAL LOW (ref 4.20–5.82)
RDW: 19.1 % — ABNORMAL HIGH (ref 11.0–14.6)
WBC: 3.6 10*3/uL — AB (ref 4.0–10.3)

## 2014-07-06 MED ORDER — SODIUM CHLORIDE 0.9 % IV SOLN
504.0000 mg | Freq: Once | INTRAVENOUS | Status: AC
  Administered 2014-07-06: 500 mg via INTRAVENOUS
  Filled 2014-07-06: qty 50

## 2014-07-06 MED ORDER — SODIUM CHLORIDE 0.9 % IV SOLN
Freq: Once | INTRAVENOUS | Status: AC
  Administered 2014-07-06: 11:00:00 via INTRAVENOUS

## 2014-07-06 MED ORDER — ONDANSETRON 16 MG/50ML IVPB (CHCC)
16.0000 mg | Freq: Once | INTRAVENOUS | Status: AC
  Administered 2014-07-06: 16 mg via INTRAVENOUS

## 2014-07-06 MED ORDER — ONDANSETRON 16 MG/50ML IVPB (CHCC)
INTRAVENOUS | Status: AC
  Filled 2014-07-06: qty 16

## 2014-07-06 MED ORDER — PEMETREXED DISODIUM CHEMO INJECTION 500 MG
500.0000 mg/m2 | Freq: Once | INTRAVENOUS | Status: AC
  Administered 2014-07-06: 1000 mg via INTRAVENOUS
  Filled 2014-07-06: qty 40

## 2014-07-06 MED ORDER — DEXAMETHASONE SODIUM PHOSPHATE 20 MG/5ML IJ SOLN
20.0000 mg | Freq: Once | INTRAMUSCULAR | Status: AC
  Administered 2014-07-06: 20 mg via INTRAVENOUS

## 2014-07-06 MED ORDER — DEXAMETHASONE SODIUM PHOSPHATE 20 MG/5ML IJ SOLN
INTRAMUSCULAR | Status: AC
  Filled 2014-07-06: qty 5

## 2014-07-06 NOTE — Telephone Encounter (Signed)
Shirlean Mylar reports Dr. Martinique reports changes since 06-07-2014 scan.  Increased to moderate pleural effusion.  Call Transferred to [employer] PA-C.

## 2014-07-06 NOTE — Telephone Encounter (Signed)
Pt confirmed labs/ov per 07/15 POF 08/05 w/infusion there was no availability for MD moved to 08/03, gave pt AVS...Marland KitchenMarland KitchenKJ

## 2014-07-06 NOTE — Telephone Encounter (Signed)
gv adn rpinted appt sched and avs for pt for July adn Aug

## 2014-07-06 NOTE — Patient Instructions (Signed)
[facility] Discharge Instructions for Patients Receiving Chemotherapy  Today you received the following chemotherapy agents alimta, and carboplatin  To help prevent nausea and vomiting after your treatment, we encourage you to take your nausea medication if needed.   If you develop nausea and vomiting that is not controlled by your nausea medication, call the clinic.   BELOW ARE SYMPTOMS THAT SHOULD BE REPORTED IMMEDIATELY:  *FEVER GREATER THAN 100.5 F  *CHILLS WITH OR WITHOUT FEVER  NAUSEA AND VOMITING THAT IS NOT CONTROLLED WITH YOUR NAUSEA MEDICATION  *UNUSUAL SHORTNESS OF BREATH  *UNUSUAL BRUISING OR BLEEDING  TENDERNESS IN MOUTH AND THROAT WITH OR WITHOUT PRESENCE OF ULCERS  *URINARY PROBLEMS  *BOWEL PROBLEMS  UNUSUAL RASH Items with * indicate a potential emergency and should be followed up as soon as possible.  Feel free to call the clinic you have any questions or concerns. The clinic phone number is [phone].

## 2014-07-11 ENCOUNTER — Ambulatory Visit (HOSPITAL_COMMUNITY)
Admission: RE | Admit: 2014-07-11 | Discharge: 2014-07-11 | Disposition: A | Discharge status: Home / Self Care | Payer: Medicare Other | Source: Ambulatory Visit | Attending: Physician Assistant | Admitting: Physician Assistant

## 2014-07-11 ENCOUNTER — Telehealth: Payer: Self-pay | Admitting: Internal Medicine

## 2014-07-11 ENCOUNTER — Ambulatory Visit (HOSPITAL_COMMUNITY)
Admission: RE | Admit: 2014-07-11 | Discharge: 2014-07-11 | Disposition: A | Discharge status: Home / Self Care | Payer: Medicare Other | Source: Ambulatory Visit | Attending: Radiology | Admitting: Radiology

## 2014-07-11 DIAGNOSIS — R0609 Other forms of dyspnea: Secondary | ICD-10-CM | POA: Insufficient documentation

## 2014-07-11 DIAGNOSIS — R0600 Dyspnea, unspecified: Secondary | ICD-10-CM

## 2014-07-11 DIAGNOSIS — J90 Pleural effusion, not elsewhere classified: Secondary | ICD-10-CM | POA: Insufficient documentation

## 2014-07-11 DIAGNOSIS — R0989 Other specified symptoms and signs involving the circulatory and respiratory systems: Secondary | ICD-10-CM | POA: Insufficient documentation

## 2014-07-11 DIAGNOSIS — C3490 Malignant neoplasm of unspecified part of unspecified bronchus or lung: Secondary | ICD-10-CM | POA: Insufficient documentation

## 2014-07-11 NOTE — Procedures (Signed)
US guided therapeutic left thoracentesis performed yielding 1.7 liters yellow fluid. F/u CXR pending. No immediate complications.

## 2014-07-11 NOTE — Patient Instructions (Signed)
Your being sent for chest x-ray 3 reevaluate your pleural effusion. You'll be scheduled for a therapeutic thoracentesis insufficient fluid is present Proceed with cycle #5 of your chemotherapy today schedule Continue weekly labs as scheduled Followup in 3 weeks prior to next scheduled cycle of chemotherapy

## 2014-07-11 NOTE — Telephone Encounter (Signed)
Lft msg for pt advising of changed apt per MD, mailed updated schedule to pt....Marland KitchenMarland Kitchen

## 2014-07-11 NOTE — Progress Notes (Signed)
Shawn Espinoza [phone]   [phone]  OFFICE VISIT PROGRESS NOTE  [name], MD [place] [address]  DIAGNOSIS: Stage IIIB/IV non-small cell lung cancer, adenocarcinoma diagnosed in March of 2015. Specimen sent to [facility] for biomarker testing however there was insufficient material to proceed with testing. 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  PRIOR THERAPY: None.  CURRENT THERAPY: systemic chemotherapy with carboplatin for AUC of 5 and Alimta 500 mg/M2 every 3 weeks. First dose given on 04/13/2014. Status post 4 cycles.  INTERVAL HISTORY: Shawn Espinoza 73 y.o. male returns to the clinic today for a follow up visit. He is accompanied by his wife.  He is status post 4 cycles systemic chemotherapy with carboplatin and Alimta. Overall is tolerating his chemotherapy relatively well. He continues to be active, playing golf. He complains of increased shortness of breath and dizziness particularly after exertion. He presents to proceed with his next cycle of chemotherapy. He voiced no other specific complaints today.  He denied having any significant chest pain but continues to have mild cough with no hemoptysis.   MEDICAL HISTORY: Past Medical History  Diagnosis Date  . History of diabetes mellitus, type II     resolved with diet, h/o neuropathy  . Diverticulosis of colon   . Hyperlipidemia   . Hypertension   . Fatty liver 02/29/00    abd ultrasound  . Lower back pain   . Systolic murmur 7591    2Decho - normal LV fxn, EF 55%, mild AS, biatrial enlargement  . History of tobacco use quit 1990s  . 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 Personal history of  colonic adenomas 07/06/2013  . Hypertensive retinopathy of both eyes 2015    mild  . nscl ca dx'd 02/2014    ALLERGIES:  is allergic to candesartan cilexetil; diltiazem hcl; doxazosin mesylate; nifedipine; pravastatin; red yeast rice; rosuvastatin; and simvastatin.  MEDICATIONS:  Current Outpatient Prescriptions  Medication Sig Dispense Refill  . albuterol (PROVENTIL HFA;VENTOLIN HFA) 108 (90 BASE) MCG/ACT inhaler Inhale 2 puffs into the lungs every 6 (six) hours as needed for wheezing or shortness of breath.  1 Inhaler  2  . Alum & Mag Hydroxide-Simeth (MAGIC MOUTHWASH W/LIDOCAINE) SOLN Take 5 mLs by mouth 3 (three) times daily as needed for mouth pain.  240 mL  0  . amLODipine (NORVASC) 5 MG tablet Take 1 tablet (5 mg total) by mouth daily.  90 tablet  2  . atenolol (TENORMIN) 25 MG tablet Take 1 tablet (25 mg total) by mouth 2 (two) times daily.  180 tablet  2  . cetirizine (ZYRTEC) 10 MG tablet Take 10 mg by mouth daily.      . cholecalciferol (VITAMIN D) 1000 UNITS tablet Take 1,000 Units by mouth daily.      Marland Kitchen dexamethasone (DECADRON) 4 MG tablet 4 mg by mouth twice a day the day before, day of and day after the chemotherapy every 3 weeks.  40 tablet  1  . Digestive Enzymes (PAPAYA AND ENZYMES PO) Take by mouth as needed.        . folic acid (FOLVITE) 1 MG tablet Take 1 tablet (1 mg total) by  mouth daily.  30 tablet  4  . glucose blood (ONE TOUCH ULTRA TEST) test strip USE ONE STRIP TO CHECK GLUCOSE ONCE DAILY IN THE MORNING 4 TO 5 DAYS A WEEK Dx:250.00  100 each  3  . guaiFENesin (MUCINEX) 600 MG 12 hr tablet Take by mouth 2 (two) times daily.      Marland Kitchen ibuprofen (ADVIL,MOTRIN) 800 MG tablet Take 1 tablet (800 mg total) by mouth 2 (two) times daily as needed.  40 tablet  1  . Lancets (ONETOUCH ULTRASOFT) lancets Use to check sugar 4-5 times weekly. Dx: 250.00  100 each  3  . methocarbamol (ROBAXIN) 500 MG tablet Take 500 mg by mouth every 8 (eight) hours as needed.      . Milk Thistle 200  MG CAPS Take 1 capsule by mouth 2 (two) times daily.      . Multiple Vitamins-Minerals (EYE VITAMINS PO) 2 (two) times daily. Due to macular degeneration      . Polyethylene Glycol 3350 (MIRALAX PO) Take by mouth as directed. Takes 1/2 dose PRN      . LORazepam (ATIVAN) 0.5 MG tablet Take 1 tablet (0.5 mg total) by mouth as directed. Take 0.38m 30 minutes prior to MRI  1 tablet  0  . prochlorperazine (COMPAZINE) 10 MG tablet Take 1 tablet (10 mg total) by mouth every 6 (six) hours as needed for nausea or vomiting.  60 tablet  0  . sucralfate (CARAFATE) 1 GM/10ML suspension Take 10 mLs (1 g total) by mouth 4 (four) times daily -  with meals and at bedtime.  420 mL  0  . vitamin C (ASCORBIC ACID) 500 MG tablet Take 250 mg by mouth daily.        No current facility-administered medications for this visit.    SURGICAL HISTORY:  Past Surgical History  Procedure Laterality Date  . Colonoscopy  2004  . Colonoscopy  2014    tubular adenoma x1, mod diverticulosis ([place])  . Flexible bronchoscopy  02/2014    WNL  . Video bronchoscopy Bilateral 03/17/2014    Procedure: VIDEO BRONCHOSCOPY WITH FLUORO;  Surgeon: MTanda Rockers MD;  Location: [facility];  Service: Cardiopulmonary;  Laterality: Bilateral;    REVIEW OF SYSTEMS:  Constitutional: positive for fatigue Eyes: negative Ears, nose, mouth, throat, and face: negative Respiratory: positive for dyspnea on exertion and Increased shortness of breath with exertion Cardiovascular: negative Gastrointestinal: positive for constipation Genitourinary:negative Integument/breast: positive for rash Hematologic/lymphatic: negative Musculoskeletal:negative Neurological: positive for dizziness Behavioral/Psych: negative Endocrine: negative Allergic/Immunologic: negative   PHYSICAL EXAMINATION: General appearance: alert, cooperative, fatigued and no distress Head: Normocephalic, without obvious abnormality, atraumatic Neck: no adenopathy, no JVD,  supple, symmetrical, trachea midline and thyroid not enlarged, symmetric, no tenderness/mass/nodules Lymph nodes: Cervical, supraclavicular, and axillary nodes normal. Resp: diminished breath sounds LLL and dullness to percussion LLL Back: symmetric, no curvature. ROM normal. No CVA tenderness. Cardio: regular rate and rhythm, S1, S2 normal, no murmur, click, rub or gallop GI: soft, non-tender; bowel sounds normal; no masses,  no organomegaly Extremities: extremities normal, atraumatic, no cyanosis or edema Neurologic: Alert and oriented X 3, normal strength and tone. Normal symmetric reflexes. Normal coordination and gait Mouth: Clear, no evidence of thrush or mucositis Skin: Erythematous to and macular lesions scattered over the lower extremities and bilateral forearms in the sun exposed areas -unchanged  ECOG PERFORMANCE STATUS: 1 - Symptomatic but completely ambulatory  Blood pressure 146/72, pulse 70, temperature 97.8 F (36.6 C), temperature source Oral,  resp. rate 19, height 5' 9"  (1.753 m), weight 184 lb 8 oz (83.689 kg).  LABORATORY DATA: Lab Results  Component Value Date   WBC 3.6* 07/06/2014   HGB 13.2 07/06/2014   HCT 39.0 07/06/2014   MCV 98.5* 07/06/2014   PLT 228 07/06/2014      Chemistry      Component Value Date/Time   NA 138 07/06/2014 0926   NA 141 03/09/2014 0834   K 4.5 07/06/2014 0926   K 4.5 03/09/2014 0834   CL 104 03/09/2014 0834   CO2 23 07/06/2014 0926   CO2 30 03/09/2014 0834   BUN 20.0 07/06/2014 0926   BUN 15 03/09/2014 0834   CREATININE 1.0 07/06/2014 0926   CREATININE 0.8 03/09/2014 0834   CREATININE 0.83 02/22/2014 1314      Component Value Date/Time   CALCIUM 9.1 07/06/2014 0926   CALCIUM 8.7 03/09/2014 0834   ALKPHOS 75 07/06/2014 0926   ALKPHOS 55 03/09/2014 0834   AST 14 07/06/2014 0926   AST 20 03/09/2014 0834   ALT 17 07/06/2014 0926   ALT 21 03/09/2014 0834   BILITOT 0.82 07/06/2014 0926   BILITOT 1.2 03/09/2014 0834       RADIOGRAPHIC STUDIES: Ct  Chest W Contrast  06/08/2014   CLINICAL DATA:  Non-small cell lung carcinoma. Currently undergoing chemotherapy. Shortness of breath. Dysphagia.  EXAM: CT CHEST, ABDOMEN, AND PELVIS WITH CONTRAST  TECHNIQUE: Multidetector CT imaging of the chest, abdomen and pelvis was performed following the standard protocol during bolus administration of intravenous contrast.  CONTRAST:  126m OMNIPAQUE IOHEXOL 300 MG/ML  SOLN  COMPARISON:  PET-CT on 03/30/2014  FINDINGS: CT CHEST FINDINGS  Mediastinal and bilateral hilar lymphadenopathy has decreased since previous study. Index lymph node in the AP window on image 23 measures 10 mm compared with 16 mm on previous study. No new sites of lymphadenopathy identified within the thorax.  Moderate left pleural effusion shows increase in size, however there is significant improvement seen in mixed interstitial and airspace disease seen in the left upper and lower lobes since prior exam. Decreased size of spiculated nodule seen in the lateral left lower lobe which now measures 11 x 15 mm on image 38 compared with 17 x 20 mm on prior exam. This is consistent with improving lymphangitic spread of carcinoma. 6 mm pulmonary nodule in the posterior right lower lobe on image 38 remains stable. No other right lung nodules or masses identified. No suspicious bone lesions identified.  CT ABDOMEN AND PELVIS FINDINGS  Heterogeneous mass in the central right hepatic lobe shows mild decrease in size, currently measuring 4.2 x 4.8 cm on image 3 of series 5 compared to 4.7 x 5.4 cm previously. No other liver lesions are identified. The adrenal glands, pancreas, spleen, and kidneys are normal in appearance. Shotty less than 1 cm lymph nodes in the porta hepatis and portacaval space are unchanged. No pathologically enlarged nodes seen elsewhere within the abdomen or pelvis.  Mildly enlarged prostate gland is stable. No evidence of inflammatory process or abnormal fluid collections. Mild diverticulosis  is noted, without evidence of diverticulitis. No evidence of dilated bowel loops. No suspicious bone lesions identified.  IMPRESSION: Decreased size of left lower lobe spiculated nodule and lymphangitic spread throughout the left lung.  Decreased hilar and mediastinal lymphadenopathy.  Mild decrease in size of right hepatic lobe mass.  Mild increase in size of left pleural effusion. No other new or progressive disease identified.   Electronically Signed  By: Earle Gell M.D.   On: 06/08/2014 13:02   Ct Abdomen Pelvis W Contrast  06/08/2014   CLINICAL DATA:  Non-small cell lung carcinoma. Currently undergoing chemotherapy. Shortness of breath. Dysphagia.  EXAM: CT CHEST, ABDOMEN, AND PELVIS WITH CONTRAST  TECHNIQUE: Multidetector CT imaging of the chest, abdomen and pelvis was performed following the standard protocol during bolus administration of intravenous contrast.  CONTRAST:  124m OMNIPAQUE IOHEXOL 300 MG/ML  SOLN  COMPARISON:  PET-CT on 03/30/2014  FINDINGS: CT CHEST FINDINGS  Mediastinal and bilateral hilar lymphadenopathy has decreased since previous study. Index lymph node in the AP window on image 23 measures 10 mm compared with 16 mm on previous study. No new sites of lymphadenopathy identified within the thorax.  Moderate left pleural effusion shows increase in size, however there is significant improvement seen in mixed interstitial and airspace disease seen in the left upper and lower lobes since prior exam. Decreased size of spiculated nodule seen in the lateral left lower lobe which now measures 11 x 15 mm on image 38 compared with 17 x 20 mm on prior exam. This is consistent with improving lymphangitic spread of carcinoma. 6 mm pulmonary nodule in the posterior right lower lobe on image 38 remains stable. No other right lung nodules or masses identified. No suspicious bone lesions identified.  CT ABDOMEN AND PELVIS FINDINGS  Heterogeneous mass in the central right hepatic lobe shows mild  decrease in size, currently measuring 4.2 x 4.8 cm on image 3 of series 5 compared to 4.7 x 5.4 cm previously. No other liver lesions are identified. The adrenal glands, pancreas, spleen, and kidneys are normal in appearance. Shotty less than 1 cm lymph nodes in the porta hepatis and portacaval space are unchanged. No pathologically enlarged nodes seen elsewhere within the abdomen or pelvis.  Mildly enlarged prostate gland is stable. No evidence of inflammatory process or abnormal fluid collections. Mild diverticulosis is noted, without evidence of diverticulitis. No evidence of dilated bowel loops. No suspicious bone lesions identified.  IMPRESSION: Decreased size of left lower lobe spiculated nodule and lymphangitic spread throughout the left lung.  Decreased hilar and mediastinal lymphadenopathy.  Mild decrease in size of right hepatic lobe mass.  Mild increase in size of left pleural effusion. No other new or progressive disease identified.   Electronically Signed   By: JEarle GellM.D.   On: 06/08/2014 13:02   ASSESSMENT AND PLAN: this is a very pleasant 73years old white male with stage IV non-small cell lung cancer, adenocarcinoma presenting with large right upper lobe lung mass in addition to mediastinal and bilateral hilar lymphadenopathy as well as cervical lymphadenopathy and left pleural effusion. Molecular biomarker testing was negative for EGFR and ALK. The patient is being treated with systemic chemotherapy in the form of carboplatin for AUC of 5 and Alimta 500 mg/M2 every 3 weeks, status post 4 cycles. His most recent restaging CT scan revealed decreased size of the left lower lobe spiculated nodule lymphangitic spread throughout the left lung, there is decreased hilar and mediastinal lymphadenopathy, there is mild decrease in the size of the right hepatic lobe mass. There is mild increase in the size of left pleural effusion however there were no new or progressive disease identified. His counts  have been reviewed and are within treatable range. He will proceed today with cycle #5 of his systemic chemotherapy with carboplatin and Alimta without dose modifications. He will continue with weekly labs as scheduled. He'll followup in 3  weeks prior to this start of cycle #6 repeat CBC differential and C. met.   To further evaluate his complaints of increased shortness of breath with exertion and dizziness, he'll be sent for chest x-ray looking for increased pleural effusion that may require a thoracentesis. Larkin Ina a fluid be present the patient will be scheduled for a therapeutic thoracentesis.  He was advised to call immediately if he has any concerning symptoms in the interval.  The patient voices understanding of current disease status and treatment options and is in agreement with the current care plan.  All questions were answered. The patient knows to call the clinic with any problems, questions or concerns. We can certainly see the patient much sooner if necessary.  Carlton Adam, PA-C   Disclaimer: This note was dictated with voice recognition software. Similar sounding words can inadvertently be transcribed and may not be corrected upon review. Carlton Adam, PA-C 07/11/2014

## 2014-07-13 ENCOUNTER — Other Ambulatory Visit (HOSPITAL_BASED_OUTPATIENT_CLINIC_OR_DEPARTMENT_OTHER): Payer: Medicare Other

## 2014-07-13 DIAGNOSIS — C3410 Malignant neoplasm of upper lobe, unspecified bronchus or lung: Secondary | ICD-10-CM

## 2014-07-13 DIAGNOSIS — C3490 Malignant neoplasm of unspecified part of unspecified bronchus or lung: Secondary | ICD-10-CM

## 2014-07-13 LAB — CBC WITH DIFFERENTIAL/PLATELET
BASO%: 2.4 % — ABNORMAL HIGH (ref 0.0–2.0)
Basophils Absolute: 0 10*3/uL (ref 0.0–0.1)
EOS%: 1.6 % (ref 0.0–7.0)
Eosinophils Absolute: 0 10*3/uL (ref 0.0–0.5)
HEMATOCRIT: 34 % — AB (ref 38.4–49.9)
HGB: 11.9 g/dL — ABNORMAL LOW (ref 13.0–17.1)
LYMPH%: 31 % (ref 14.0–49.0)
MCH: 33.5 pg — ABNORMAL HIGH (ref 27.2–33.4)
MCHC: 35 g/dL (ref 32.0–36.0)
MCV: 95.8 fL (ref 79.3–98.0)
MONO#: 0.1 10*3/uL (ref 0.1–0.9)
MONO%: 11.1 % (ref 0.0–14.0)
NEUT#: 0.7 10*3/uL — ABNORMAL LOW (ref 1.5–6.5)
NEUT%: 53.9 % (ref 39.0–75.0)
Platelets: 87 10*3/uL — ABNORMAL LOW (ref 140–400)
RBC: 3.55 10*6/uL — ABNORMAL LOW (ref 4.20–5.82)
RDW: 15.4 % — AB (ref 11.0–14.6)
WBC: 1.3 10*3/uL — AB (ref 4.0–10.3)
lymph#: 0.4 10*3/uL — ABNORMAL LOW (ref 0.9–3.3)

## 2014-07-13 LAB — COMPREHENSIVE METABOLIC PANEL (CC13)
ALBUMIN: 3.2 g/dL — AB (ref 3.5–5.0)
ALT: 17 U/L (ref 0–55)
ANION GAP: 8 meq/L (ref 3–11)
AST: 16 U/L (ref 5–34)
Alkaline Phosphatase: 58 U/L (ref 40–150)
BUN: 20.9 mg/dL (ref 7.0–26.0)
CHLORIDE: 101 meq/L (ref 98–109)
CO2: 28 meq/L (ref 22–29)
CREATININE: 0.9 mg/dL (ref 0.7–1.3)
Calcium: 8.5 mg/dL (ref 8.4–10.4)
Glucose: 234 mg/dl — ABNORMAL HIGH (ref 70–140)
Potassium: 4.4 mEq/L (ref 3.5–5.1)
SODIUM: 137 meq/L (ref 136–145)
TOTAL PROTEIN: 5.5 g/dL — AB (ref 6.4–8.3)
Total Bilirubin: 1.28 mg/dL — ABNORMAL HIGH (ref 0.20–1.20)

## 2014-07-14 ENCOUNTER — Telehealth: Payer: Self-pay | Admitting: Medical Oncology

## 2014-07-14 NOTE — Telephone Encounter (Signed)
Note to Shawn Espinoza

## 2014-07-14 NOTE — Telephone Encounter (Signed)
Per Dr. [name] I instructed wife to tell pt to monitor nosebleeds for now and to use saline nasal spray . She understands to call for persistent nosebleeds. temp >100.5 F or any other problems.

## 2014-07-14 NOTE — Telephone Encounter (Addendum)
Pt had 2 episodes of nosebleeds beginning yesterday and again this am. "Not gushing" -Blood coming from right nostril. Wife reports he is vigorously blowing his nose. I instructed her to tell him to blow nose gently and will discuss with [facility].

## 2014-07-14 NOTE — Telephone Encounter (Signed)
Reports nosebleeds x 2 /24  hours lasting 10 minutes , just from right nostril.

## 2014-07-20 ENCOUNTER — Ambulatory Visit (INDEPENDENT_AMBULATORY_CARE_PROVIDER_SITE_OTHER): Payer: Medicare Other | Admitting: Family Medicine

## 2014-07-20 ENCOUNTER — Encounter: Payer: Self-pay | Admitting: Family Medicine

## 2014-07-20 ENCOUNTER — Other Ambulatory Visit (HOSPITAL_BASED_OUTPATIENT_CLINIC_OR_DEPARTMENT_OTHER): Payer: Medicare Other

## 2014-07-20 VITALS — BP 142/84 | HR 68 | Temp 97.8°F | Wt 184.2 lb

## 2014-07-20 DIAGNOSIS — C3490 Malignant neoplasm of unspecified part of unspecified bronchus or lung: Secondary | ICD-10-CM

## 2014-07-20 DIAGNOSIS — R0609 Other forms of dyspnea: Secondary | ICD-10-CM

## 2014-07-20 DIAGNOSIS — R0989 Other specified symptoms and signs involving the circulatory and respiratory systems: Secondary | ICD-10-CM

## 2014-07-20 DIAGNOSIS — C3492 Malignant neoplasm of unspecified part of left bronchus or lung: Secondary | ICD-10-CM

## 2014-07-20 DIAGNOSIS — C3410 Malignant neoplasm of upper lobe, unspecified bronchus or lung: Secondary | ICD-10-CM

## 2014-07-20 DIAGNOSIS — Z8639 Personal history of other endocrine, nutritional and metabolic disease: Secondary | ICD-10-CM

## 2014-07-20 DIAGNOSIS — Z862 Personal history of diseases of the blood and blood-forming organs and certain disorders involving the immune mechanism: Secondary | ICD-10-CM

## 2014-07-20 LAB — COMPREHENSIVE METABOLIC PANEL (CC13)
ALK PHOS: 69 U/L (ref 40–150)
ALT: 15 U/L (ref 0–55)
AST: 24 U/L (ref 5–34)
Albumin: 3.6 g/dL (ref 3.5–5.0)
Anion Gap: 10 mEq/L (ref 3–11)
BUN: 18.9 mg/dL (ref 7.0–26.0)
CO2: 23 mEq/L (ref 22–29)
Calcium: 8.7 mg/dL (ref 8.4–10.4)
Chloride: 105 mEq/L (ref 98–109)
Creatinine: 0.9 mg/dL (ref 0.7–1.3)
Glucose: 197 mg/dl — ABNORMAL HIGH (ref 70–140)
POTASSIUM: 5 meq/L (ref 3.5–5.1)
SODIUM: 139 meq/L (ref 136–145)
TOTAL PROTEIN: 6.1 g/dL — AB (ref 6.4–8.3)
Total Bilirubin: 0.73 mg/dL (ref 0.20–1.20)

## 2014-07-20 LAB — CBC WITH DIFFERENTIAL/PLATELET
BASO%: 0 % (ref 0.0–2.0)
BASOS ABS: 0 10*3/uL (ref 0.0–0.1)
EOS%: 1.7 % (ref 0.0–7.0)
Eosinophils Absolute: 0 10*3/uL (ref 0.0–0.5)
HCT: 31.7 % — ABNORMAL LOW (ref 38.4–49.9)
HEMOGLOBIN: 10.8 g/dL — AB (ref 13.0–17.1)
LYMPH#: 0.6 10*3/uL — AB (ref 0.9–3.3)
LYMPH%: 32.2 % (ref 14.0–49.0)
MCH: 33 pg (ref 27.2–33.4)
MCHC: 34.1 g/dL (ref 32.0–36.0)
MCV: 96.9 fL (ref 79.3–98.0)
MONO#: 0.3 10*3/uL (ref 0.1–0.9)
MONO%: 16.1 % — AB (ref 0.0–14.0)
NEUT#: 0.9 10*3/uL — ABNORMAL LOW (ref 1.5–6.5)
NEUT%: 50 % (ref 39.0–75.0)
Platelets: 56 10*3/uL — ABNORMAL LOW (ref 140–400)
RBC: 3.27 10*6/uL — AB (ref 4.20–5.82)
RDW: 15.9 % — AB (ref 11.0–14.6)
WBC: 1.8 10*3/uL — ABNORMAL LOW (ref 4.0–10.3)
nRBC: 1 % — ABNORMAL HIGH (ref 0–0)

## 2014-07-20 MED ORDER — GLIMEPIRIDE 1 MG PO TABS: 1.0000 mg | ORAL_TABLET | ORAL | Status: DC

## 2014-07-20 NOTE — Progress Notes (Signed)
BP 142/84  Pulse 68  Temp(Src) 97.8 F (36.6 C)  Wt 184 lb 4 oz (83.575 kg)   CC: 6 mo f/u  Subjective:    Patient ID: Shawn Espinoza, male    DOB: [birth year], 73 y.o.   MRN: [id number]  HPI: Shawn Espinoza is a 73 y.o. male presenting on 07/20/2014 for Follow-up   Non-small cell carcinoma of right lung Date: 02/2014 stage IIIb/IV on chemo sees Dr Earlie Server, tolerating this well Q3 wk infusion. Some nosebleeds controlled with nasal saline. Persistent dyspnea and dizziness. Noticing sugar spikes with decadron prior to steroids - up to 300s.  Lab Results  Component Value Date   HGBA1C 5.4 03/03/2013    Relevant past medical, surgical, family and social history reviewed and updated as indicated.  Allergies and medications reviewed and updated. Current Outpatient Prescriptions on File Prior to Visit  Medication Sig  . albuterol (PROVENTIL HFA;VENTOLIN HFA) 108 (90 BASE) MCG/ACT inhaler Inhale 2 puffs into the lungs every 6 (six) hours as needed for wheezing or shortness of breath.  . Alum & Mag Hydroxide-Simeth (MAGIC MOUTHWASH W/LIDOCAINE) SOLN Take 5 mLs by mouth 3 (three) times daily as needed for mouth pain.  Marland Kitchen amLODipine (NORVASC) 5 MG tablet Take 1 tablet (5 mg total) by mouth daily.  Marland Kitchen atenolol (TENORMIN) 25 MG tablet Take 1 tablet (25 mg total) by mouth 2 (two) times daily.  . cholecalciferol (VITAMIN D) 1000 UNITS tablet Take 1,000 Units by mouth daily.  Marland Kitchen dexamethasone (DECADRON) 4 MG tablet 4 mg by mouth twice a day the day before, day of and day after the chemotherapy every 3 weeks.  . Digestive Enzymes (PAPAYA AND ENZYMES PO) Take by mouth as needed.    . folic acid (FOLVITE) 1 MG tablet Take 1 tablet (1 mg total) by mouth daily.  Marland Kitchen glucose blood (ONE TOUCH ULTRA TEST) test strip USE ONE STRIP TO CHECK GLUCOSE ONCE DAILY IN THE MORNING 4 TO 5 DAYS A WEEK Dx:250.00  . guaiFENesin (MUCINEX) 600 MG 12 hr tablet Take by mouth 2 (two) times daily.  Marland Kitchen ibuprofen  (ADVIL,MOTRIN) 800 MG tablet Take 1 tablet (800 mg total) by mouth 2 (two) times daily as needed.  . Lancets (ONETOUCH ULTRASOFT) lancets Use to check sugar 4-5 times weekly. Dx: 250.00  . LORazepam (ATIVAN) 0.5 MG tablet Take 1 tablet (0.5 mg total) by mouth as directed. Take 0.5mg  30 minutes prior to MRI  . methocarbamol (ROBAXIN) 500 MG tablet Take 500 mg by mouth every 8 (eight) hours as needed.  . Milk Thistle 200 MG CAPS Take 1 capsule by mouth 2 (two) times daily.  . Multiple Vitamins-Minerals (EYE VITAMINS PO) 2 (two) times daily. Due to macular degeneration  . Polyethylene Glycol 3350 (MIRALAX PO) Take by mouth as directed. Takes 1/2 dose PRN  . prochlorperazine (COMPAZINE) 10 MG tablet Take 1 tablet (10 mg total) by mouth every 6 (six) hours as needed for nausea or vomiting.  . cetirizine (ZYRTEC) 10 MG tablet Take 10 mg by mouth daily.  . sucralfate (CARAFATE) 1 GM/10ML suspension Take 10 mLs (1 g total) by mouth 4 (four) times daily -  with meals and at bedtime.  . vitamin C (ASCORBIC ACID) 500 MG tablet Take 250 mg by mouth daily.    No current facility-administered medications on file prior to visit.    Review of Systems Per HPI unless specifically indicated above    Objective:    BP 142/84  Pulse 68  Temp(Src) 97.8 F (36.6 C)  Wt 184 lb 4 oz (83.575 kg)  Physical Exam  Nursing note and vitals reviewed. Constitutional: He appears well-developed and well-nourished. No distress.  Good spirits  HENT:  Mouth/Throat: Oropharynx is clear and moist. No oropharyngeal exudate.  Cardiovascular: Normal rate, regular rhythm, normal heart sounds and intact distal pulses.   No murmur heard. Pulmonary/Chest: Effort normal. No respiratory distress. He has decreased breath sounds in the left lower field. He has no wheezes. He has no rhonchi. He has no rales.  Coarse L lung throughout  Musculoskeletal: He exhibits no edema.   Results for orders placed in visit on 07/20/14  CBC WITH  DIFFERENTIAL      Result Value Ref Range   WBC 1.8 (*) 4.0 - 10.3 10e3/uL   NEUT# 0.9 (*) 1.5 - 6.5 10e3/uL   HGB 10.8 (*) 13.0 - 17.1 g/dL   HCT 31.7 (*) 38.4 - 49.9 %   Platelets 56 (*) 140 - 400 10e3/uL   MCV 96.9  79.3 - 98.0 fL   MCH 33.0  27.2 - 33.4 pg   MCHC 34.1  32.0 - 36.0 g/dL   RBC 3.27 (*) 4.20 - 5.82 10e6/uL   RDW 15.9 (*) 11.0 - 14.6 %   lymph# 0.6 (*) 0.9 - 3.3 10e3/uL   MONO# 0.3  0.1 - 0.9 10e3/uL   Eosinophils Absolute 0.0  0.0 - 0.5 10e3/uL   Basophils Absolute 0.0  0.0 - 0.1 10e3/uL   NEUT% 50.0  39.0 - 75.0 %   LYMPH% 32.2  14.0 - 49.0 %   MONO% 16.1 (*) 0.0 - 14.0 %   EOS% 1.7  0.0 - 7.0 %   BASO% 0.0  0.0 - 2.0 %   nRBC 1 (*) 0 - 0 %  COMPREHENSIVE METABOLIC PANEL (QP59)      Result Value Ref Range   Sodium 139  136 - 145 mEq/L   Potassium 5.0  3.5 - 5.1 mEq/L   Chloride 105  98 - 109 mEq/L   CO2 23  22 - 29 mEq/L   Glucose 197 (*) 70 - 140 mg/dl   BUN 18.9  7.0 - 26.0 mg/dL   Creatinine 0.9  0.7 - 1.3 mg/dL   Total Bilirubin 0.73  0.20 - 1.20 mg/dL   Alkaline Phosphatase 69  40 - 150 U/L   AST 24  5 - 34 U/L   ALT 15  0 - 55 U/L   Total Protein 6.1 (*) 6.4 - 8.3 g/dL   Albumin 3.6  3.5 - 5.0 g/dL   Calcium 8.7  8.4 - 10.4 mg/dL   Anion Gap 10  3 - 11 mEq/L      Assessment & Plan:   Problem List Items Addressed This Visit   History of diabetes mellitus, type II     Improved with diet changes in past. Now endorses significant hyperglycemia with decadron use - will recommend starting amaryl to take whenever takes decadron Reviewed avoiding added sugars and simple carbs when on decadron.    DOE (dyspnea on exertion)     With decreased breath sounds at L lung base - concern for re-developing effusion. Discussed with patient, defers further eval for now, agrees to notify us or onc if worsening dyspnea for rpt CXR and if stable consider echo.    Adenocarcinoma of lung - Primary     Appreciate care provided by onc Dr. Earlie Server. Continue chemo  cycles.  Follow up plan: Return if symptoms worsen or fail to improve, for annual exam, prior fasting for blood work.

## 2014-07-20 NOTE — Patient Instructions (Signed)
Take amaryl 1mg  daily in morning with breakfast on days you take decadron.  Good to see you today, call us with quesitons. Return in 6 months for wellness visit, sooner if needed.

## 2014-07-20 NOTE — Progress Notes (Signed)
Pre visit review using our clinic review tool, if applicable. No additional management support is needed unless otherwise documented below in the visit note. 

## 2014-07-20 NOTE — Assessment & Plan Note (Signed)
Improved with diet changes in past. Now endorses significant hyperglycemia with decadron use - will recommend starting amaryl to take whenever takes decadron Reviewed avoiding added sugars and simple carbs when on decadron.

## 2014-07-20 NOTE — Assessment & Plan Note (Signed)
With decreased breath sounds at L lung base - concern for re-developing effusion. Discussed with patient, defers further eval for now, agrees to notify us or onc if worsening dyspnea for rpt CXR and if stable consider echo.

## 2014-07-20 NOTE — Assessment & Plan Note (Signed)
Appreciate care provided by onc Dr. Earlie Server. Continue chemo cycles.

## 2014-07-25 ENCOUNTER — Ambulatory Visit (HOSPITAL_COMMUNITY)
Admission: RE | Admit: 2014-07-25 | Discharge: 2014-07-25 | Disposition: A | Discharge status: Home / Self Care | Payer: Medicare Other | Source: Ambulatory Visit | Attending: Internal Medicine | Admitting: Internal Medicine

## 2014-07-25 ENCOUNTER — Telehealth: Payer: Self-pay | Admitting: Internal Medicine

## 2014-07-25 ENCOUNTER — Encounter: Payer: Self-pay | Admitting: Internal Medicine

## 2014-07-25 ENCOUNTER — Other Ambulatory Visit (HOSPITAL_BASED_OUTPATIENT_CLINIC_OR_DEPARTMENT_OTHER): Payer: Medicare Other

## 2014-07-25 ENCOUNTER — Ambulatory Visit (HOSPITAL_BASED_OUTPATIENT_CLINIC_OR_DEPARTMENT_OTHER): Payer: Medicare Other | Admitting: Internal Medicine

## 2014-07-25 ENCOUNTER — Other Ambulatory Visit: Payer: Self-pay | Admitting: Internal Medicine

## 2014-07-25 VITALS — BP 123/66 | HR 66 | Temp 98.1°F | Resp 18 | Ht 69.0 in | Wt 187.5 lb

## 2014-07-25 DIAGNOSIS — R0602 Shortness of breath: Secondary | ICD-10-CM

## 2014-07-25 DIAGNOSIS — R0609 Other forms of dyspnea: Secondary | ICD-10-CM

## 2014-07-25 DIAGNOSIS — J9819 Other pulmonary collapse: Secondary | ICD-10-CM | POA: Insufficient documentation

## 2014-07-25 DIAGNOSIS — C3490 Malignant neoplasm of unspecified part of unspecified bronchus or lung: Secondary | ICD-10-CM

## 2014-07-25 DIAGNOSIS — C3410 Malignant neoplasm of upper lobe, unspecified bronchus or lung: Secondary | ICD-10-CM

## 2014-07-25 DIAGNOSIS — J90 Pleural effusion, not elsewhere classified: Secondary | ICD-10-CM | POA: Insufficient documentation

## 2014-07-25 DIAGNOSIS — D6481 Anemia due to antineoplastic chemotherapy: Secondary | ICD-10-CM

## 2014-07-25 DIAGNOSIS — T451X5A Adverse effect of antineoplastic and immunosuppressive drugs, initial encounter: Secondary | ICD-10-CM

## 2014-07-25 DIAGNOSIS — C3492 Malignant neoplasm of unspecified part of left bronchus or lung: Secondary | ICD-10-CM

## 2014-07-25 LAB — COMPREHENSIVE METABOLIC PANEL (CC13)
ALBUMIN: 3.5 g/dL (ref 3.5–5.0)
ALT: 17 U/L (ref 0–55)
AST: 20 U/L (ref 5–34)
Alkaline Phosphatase: 68 U/L (ref 40–150)
Anion Gap: 6 mEq/L (ref 3–11)
BUN: 12.3 mg/dL (ref 7.0–26.0)
CO2: 29 meq/L (ref 22–29)
Calcium: 8.7 mg/dL (ref 8.4–10.4)
Chloride: 104 mEq/L (ref 98–109)
Creatinine: 0.9 mg/dL (ref 0.7–1.3)
GLUCOSE: 252 mg/dL — AB (ref 70–140)
Potassium: 4.3 mEq/L (ref 3.5–5.1)
SODIUM: 139 meq/L (ref 136–145)
TOTAL PROTEIN: 5.8 g/dL — AB (ref 6.4–8.3)
Total Bilirubin: 0.81 mg/dL (ref 0.20–1.20)

## 2014-07-25 LAB — CBC WITH DIFFERENTIAL/PLATELET
BASO%: 0.5 % (ref 0.0–2.0)
Basophils Absolute: 0 10*3/uL (ref 0.0–0.1)
EOS ABS: 0 10*3/uL (ref 0.0–0.5)
EOS%: 1.9 % (ref 0.0–7.0)
HCT: 34.2 % — ABNORMAL LOW (ref 38.4–49.9)
HGB: 11.7 g/dL — ABNORMAL LOW (ref 13.0–17.1)
LYMPH%: 25.8 % (ref 14.0–49.0)
MCH: 34 pg — ABNORMAL HIGH (ref 27.2–33.4)
MCHC: 34.2 g/dL (ref 32.0–36.0)
MCV: 99.4 fL — AB (ref 79.3–98.0)
MONO#: 0.4 10*3/uL (ref 0.1–0.9)
MONO%: 19.2 % — ABNORMAL HIGH (ref 0.0–14.0)
NEUT%: 52.6 % (ref 39.0–75.0)
NEUTROS ABS: 1.1 10*3/uL — AB (ref 1.5–6.5)
PLATELETS: 138 10*3/uL — AB (ref 140–400)
RBC: 3.44 10*6/uL — ABNORMAL LOW (ref 4.20–5.82)
RDW: 17.8 % — ABNORMAL HIGH (ref 11.0–14.6)
WBC: 2.1 10*3/uL — ABNORMAL LOW (ref 4.0–10.3)
lymph#: 0.6 10*3/uL — ABNORMAL LOW (ref 0.9–3.3)
nRBC: 1 % — ABNORMAL HIGH (ref 0–0)

## 2014-07-25 NOTE — Progress Notes (Signed)
[place] [phone]) [phone]   Fax:(336) [place], [place] [address]  DIAGNOSIS: Stage IIIB/IV non-small cell lung cancer, adenocarcinoma diagnosed in March of 2015.  PRIOR THERAPY: None.  CURRENT THERAPY: Systemic chemotherapy with carboplatin for AUC of 5 and Alimta 500 mg/M2 every 3 weeks. First dose expected on 04/13/2014. Status post 5 cycles.  INTERVAL HISTORY: Shawn Espinoza 73 y.o. male returns to the clinic today for follow up visit accompanied by his wife. The patient is feeling fine today with no specific complaints except for the persistent shortness of breath at baseline and increased with exertion. He underwent left sided thoracentesis 2 weeks ago with drainage of 1700 cc of left pleural fluid. He felt better for a few days but his shortness of breath increased again. He denied having any significant chest pain but continues to have mild cough with no hemoptysis. He has no significant fever or chills, no nausea or vomiting. He tolerated the last cycle of her systemic chemotherapy was carboplatin and Alimta fairly well except for mild fatigue. He is here today to start cycle #6.  MEDICAL HISTORY: Past Medical History  Diagnosis Date  . History of diabetes mellitus, type II     resolved with diet, h/o neuropathy  . Diverticulosis of colon   . Hyperlipidemia   . Hypertension   . Fatty liver 02/29/00    abd ultrasound  . Lower back pain   . Systolic murmur 6599    2Decho - normal LV fxn, EF 55%, mild AS, biatrial enlargement  . History of tobacco use quit 1990s  . 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 Personal history of colonic adenomas 07/06/2013  . Hypertensive retinopathy of both eyes 2015    mild  . Non-small cell carcinoma of left lung 02/2014    stage  IIIb/IV on chemo    ALLERGIES:  is allergic to candesartan cilexetil; diltiazem hcl; doxazosin mesylate; nifedipine; pravastatin; red yeast rice; rosuvastatin; and simvastatin.  MEDICATIONS:  Current Outpatient Prescriptions  Medication Sig Dispense Refill  . albuterol (PROVENTIL HFA;VENTOLIN HFA) 108 (90 BASE) MCG/ACT inhaler Inhale 2 puffs into the lungs every 6 (six) hours as needed for wheezing or shortness of breath.  1 Inhaler  2  . amLODipine (NORVASC) 5 MG tablet Take 1 tablet (5 mg total) by mouth daily.  90 tablet  2  . atenolol (TENORMIN) 25 MG tablet Take 1 tablet (25 mg total) by mouth 2 (two) times daily.  180 tablet  2  . cetirizine (ZYRTEC) 10 MG tablet Take 10 mg by mouth daily.      . cholecalciferol (VITAMIN D) 1000 UNITS tablet Take 1,000 Units by mouth daily.      Marland Kitchen dexamethasone (DECADRON) 4 MG tablet 4 mg by mouth twice a day the day before, day of and day after the chemotherapy every 3 weeks.  40 tablet  1  . Digestive Enzymes (PAPAYA AND ENZYMES PO) Take by mouth as needed.        . folic acid (FOLVITE) 1 MG tablet Take 1 tablet (1 mg total) by mouth daily.  30 tablet  4  . glimepiride (AMARYL) 1 MG tablet Take 1 tablet (1 mg total) by mouth as directed. Take on days you take decadron.  [place]  tablet  0  . glucose blood (ONE TOUCH ULTRA TEST) test strip USE ONE STRIP TO CHECK GLUCOSE ONCE DAILY IN THE MORNING 4 TO 5 DAYS A WEEK Dx:250.00  100 each  3  . guaiFENesin (MUCINEX) 600 MG 12 hr tablet Take by mouth 2 (two) times daily.      Marland Kitchen ibuprofen (ADVIL,MOTRIN) 800 MG tablet Take 1 tablet (800 mg total) by mouth 2 (two) times daily as needed.  40 tablet  1  . Lancets (ONETOUCH ULTRASOFT) lancets Use to check sugar 4-5 times weekly. Dx: 250.00  100 each  3  . methocarbamol (ROBAXIN) 500 MG tablet Take 500 mg by mouth every 8 (eight) hours as needed.      . Milk Thistle 200 MG CAPS Take 1 capsule by mouth 2 (two) times daily.      . Multiple Vitamins-Minerals (EYE VITAMINS  PO) 2 (two) times daily. Due to macular degeneration      . Polyethylene Glycol 3350 (MIRALAX PO) Take by mouth as directed. Takes 1/2 dose PRN      . vitamin C (ASCORBIC ACID) 500 MG tablet Take 250 mg by mouth daily.       . Alum & Mag Hydroxide-Simeth (MAGIC MOUTHWASH W/LIDOCAINE) SOLN Take 5 mLs by mouth 3 (three) times daily as needed for mouth pain.  240 mL  0  . LORazepam (ATIVAN) 0.5 MG tablet Take 1 tablet (0.5 mg total) by mouth as directed. Take 0.5mg  30 minutes prior to MRI  1 tablet  0  . prochlorperazine (COMPAZINE) 10 MG tablet Take 1 tablet (10 mg total) by mouth every 6 (six) hours as needed for nausea or vomiting.  60 tablet  0  . sucralfate (CARAFATE) 1 GM/10ML suspension Take 10 mLs (1 g total) by mouth 4 (four) times daily -  with meals and at bedtime.  420 mL  0   No current facility-administered medications for this visit.    SURGICAL HISTORY:  Past Surgical History  Procedure Laterality Date  . Colonoscopy  2004  . Colonoscopy  2014    tubular adenoma x1, mod diverticulosis (Gessner)  . Flexible bronchoscopy  02/2014    WNL  . Video bronchoscopy Bilateral 03/17/2014    Procedure: VIDEO BRONCHOSCOPY WITH FLUORO;  Surgeon: Tanda Rockers, MD;  Location: [facility];  Service: Cardiopulmonary;  Laterality: Bilateral;    REVIEW OF SYSTEMS:  Constitutional: positive for fatigue Eyes: negative Ears, nose, mouth, throat, and face: negative Respiratory: positive for cough and dyspnea on exertion Cardiovascular: negative Gastrointestinal: negative Genitourinary:negative Integument/breast: negative Hematologic/lymphatic: negative Musculoskeletal:negative Neurological: negative Behavioral/Psych: negative Endocrine: negative Allergic/Immunologic: negative   PHYSICAL EXAMINATION: General appearance: alert, cooperative, fatigued and no distress Head: Normocephalic, without obvious abnormality, atraumatic Neck: no adenopathy, no JVD, supple, symmetrical, trachea  midline and thyroid not enlarged, symmetric, no tenderness/mass/nodules Lymph nodes: Cervical, supraclavicular, and axillary nodes normal. Resp: diminished breath sounds LLL and dullness to percussion LLL Back: symmetric, no curvature. ROM normal. No CVA tenderness. Cardio: regular rate and rhythm, S1, S2 normal, no murmur, click, rub or gallop GI: soft, non-tender; bowel sounds normal; no masses,  no organomegaly Extremities: extremities normal, atraumatic, no cyanosis or edema Neurologic: Alert and oriented X 3, normal strength and tone. Normal symmetric reflexes. Normal coordination and gait  ECOG PERFORMANCE STATUS: 1 - Symptomatic but completely ambulatory  Blood pressure 123/66, pulse 66, temperature 98.1 F (36.7 C), temperature source Oral, resp. rate 18, height 5\' 9"  (1.753 m), weight 187 lb 8 oz (85.049  kg), SpO2 98.00%.  LABORATORY DATA: Lab Results  Component Value Date   WBC 2.1* 07/25/2014   HGB 11.7* 07/25/2014   HCT 34.2* 07/25/2014   MCV 99.4* 07/25/2014   PLT 138* 07/25/2014      Chemistry      Component Value Date/Time   NA 139 07/25/2014 1055   NA 141 03/09/2014 0834   K 4.3 07/25/2014 1055   K 4.5 03/09/2014 0834   CL 104 03/09/2014 0834   CO2 29 07/25/2014 1055   CO2 30 03/09/2014 0834   BUN 12.3 07/25/2014 1055   BUN 15 03/09/2014 0834   CREATININE 0.9 07/25/2014 1055   CREATININE 0.8 03/09/2014 0834   CREATININE 0.83 02/22/2014 1314      Component Value Date/Time   CALCIUM 8.7 07/25/2014 1055   CALCIUM 8.7 03/09/2014 0834   ALKPHOS 68 07/25/2014 1055   ALKPHOS 55 03/09/2014 0834   AST 20 07/25/2014 1055   AST 20 03/09/2014 0834   ALT 17 07/25/2014 1055   ALT 21 03/09/2014 0834   BILITOT 0.81 07/25/2014 1055   BILITOT 1.2 03/09/2014 0834       RADIOGRAPHIC STUDIES:  ASSESSMENT AND PLAN: this is a very pleasant 73 years old white male with stage IV non-small cell lung cancer, adenocarcinoma presenting with large right upper lobe lung mass in addition to mediastinal and bilateral  hilar lymphadenopathy as well as cervical lymphadenopathy and left pleural effusion. The patient is currently undergoing systemic chemotherapy with carboplatin and Alimta status post 5 cycles. He is tolerating his treatment fairly well except for fatigue secondary to chemotherapy-induced anemia. His shortness of breath is most likely secondary to reaccumulation of left pleural fluid, I will arrange for the patient to have chest x-ray performed today and if needed he would prefer to interventional radiology for ultrasound-guided left thoracentesis. I also discussed with him the option of Pleurx catheter placement but he declined it. He'll proceed with his chemotherapy in 2 days as scheduled but he may need repeat CBC before his treatment because of the neutropenia. He would come back for followup visit in 3 weeks with repeat CT scan of the chest, abdomen and pelvis for restaging of his disease. He was advised to call immediately if he has any concerning symptoms in the interval.  The patient voices understanding of current disease status and treatment options and is in agreement with the current care plan.  All questions were answered. The patient knows to call the clinic with any problems, questions or concerns. We can certainly see the patient much sooner if necessary.  Disclaimer: This note was dictated with voice recognition software. Similar sounding words can inadvertently be transcribed and may not be corrected upon review.

## 2014-07-25 NOTE — Telephone Encounter (Signed)
Pt confirmed labs/ov per 08/03 POF, gave pt AVS, scheduled CT for 08/24.Marland Kitchen..KJ

## 2014-07-27 ENCOUNTER — Ambulatory Visit (HOSPITAL_BASED_OUTPATIENT_CLINIC_OR_DEPARTMENT_OTHER): Payer: Medicare Other

## 2014-07-27 ENCOUNTER — Other Ambulatory Visit: Payer: Medicare Other

## 2014-07-27 VITALS — BP 151/76 | HR 74 | Temp 97.7°F | Resp 22

## 2014-07-27 DIAGNOSIS — C3410 Malignant neoplasm of upper lobe, unspecified bronchus or lung: Secondary | ICD-10-CM

## 2014-07-27 DIAGNOSIS — Z5111 Encounter for antineoplastic chemotherapy: Secondary | ICD-10-CM

## 2014-07-27 DIAGNOSIS — C3490 Malignant neoplasm of unspecified part of unspecified bronchus or lung: Secondary | ICD-10-CM

## 2014-07-27 LAB — CBC WITH DIFFERENTIAL/PLATELET
BASO%: 0 % (ref 0.0–2.0)
Basophils Absolute: 0 10*3/uL (ref 0.0–0.1)
EOS%: 0 % (ref 0.0–7.0)
Eosinophils Absolute: 0 10*3/uL (ref 0.0–0.5)
HCT: 36.1 % — ABNORMAL LOW (ref 38.4–49.9)
HGB: 12.4 g/dL — ABNORMAL LOW (ref 13.0–17.1)
LYMPH%: 10.6 % — ABNORMAL LOW (ref 14.0–49.0)
MCH: 33.9 pg — ABNORMAL HIGH (ref 27.2–33.4)
MCHC: 34.3 g/dL (ref 32.0–36.0)
MCV: 98.6 fL — ABNORMAL HIGH (ref 79.3–98.0)
MONO#: 0.4 10*3/uL (ref 0.1–0.9)
MONO%: 11.2 % (ref 0.0–14.0)
NEUT#: 3 10*3/uL (ref 1.5–6.5)
NEUT%: 78.2 % — ABNORMAL HIGH (ref 39.0–75.0)
NRBC: 1 % — AB (ref 0–0)
Platelets: 206 10*3/uL (ref 140–400)
RBC: 3.66 10*6/uL — AB (ref 4.20–5.82)
RDW: 17.5 % — AB (ref 11.0–14.6)
WBC: 3.9 10*3/uL — ABNORMAL LOW (ref 4.0–10.3)
lymph#: 0.4 10*3/uL — ABNORMAL LOW (ref 0.9–3.3)

## 2014-07-27 MED ORDER — ONDANSETRON 16 MG/50ML IVPB (CHCC)
INTRAVENOUS | Status: AC
  Filled 2014-07-27: qty 16

## 2014-07-27 MED ORDER — CYANOCOBALAMIN 1000 MCG/ML IJ SOLN
1000.0000 ug | Freq: Once | INTRAMUSCULAR | Status: AC
  Administered 2014-07-27: 1000 ug via INTRAMUSCULAR

## 2014-07-27 MED ORDER — CYANOCOBALAMIN 1000 MCG/ML IJ SOLN
INTRAMUSCULAR | Status: AC
  Filled 2014-07-27: qty 1

## 2014-07-27 MED ORDER — SODIUM CHLORIDE 0.9 % IV SOLN
504.0000 mg | Freq: Once | INTRAVENOUS | Status: AC
  Administered 2014-07-27: 500 mg via INTRAVENOUS
  Filled 2014-07-27: qty 50

## 2014-07-27 MED ORDER — DEXAMETHASONE SODIUM PHOSPHATE 20 MG/5ML IJ SOLN
20.0000 mg | Freq: Once | INTRAMUSCULAR | Status: AC
  Administered 2014-07-27: 20 mg via INTRAVENOUS

## 2014-07-27 MED ORDER — ONDANSETRON 16 MG/50ML IVPB (CHCC)
16.0000 mg | Freq: Once | INTRAVENOUS | Status: AC
  Administered 2014-07-27: 16 mg via INTRAVENOUS

## 2014-07-27 MED ORDER — DEXAMETHASONE SODIUM PHOSPHATE 20 MG/5ML IJ SOLN
INTRAMUSCULAR | Status: AC
  Filled 2014-07-27: qty 5

## 2014-07-27 MED ORDER — PEMETREXED DISODIUM CHEMO INJECTION 500 MG
500.0000 mg/m2 | Freq: Once | INTRAVENOUS | Status: AC
  Administered 2014-07-27: 1000 mg via INTRAVENOUS
  Filled 2014-07-27: qty 40

## 2014-07-27 MED ORDER — SODIUM CHLORIDE 0.9 % IV SOLN
Freq: Once | INTRAVENOUS | Status: AC
  Administered 2014-07-27: 10:00:00 via INTRAVENOUS

## 2014-07-27 NOTE — Patient Instructions (Signed)
[facility] Discharge Instructions for Patients Receiving Chemotherapy  Today you received the following chemotherapy agents:  Alimta and Carboplatin  To help prevent nausea and vomiting after your treatment, we encourage you to take your nausea medication as ordered per MD.   If you develop nausea and vomiting that is not controlled by your nausea medication, call the clinic.   BELOW ARE SYMPTOMS THAT SHOULD BE REPORTED IMMEDIATELY:  *FEVER GREATER THAN 100.5 F  *CHILLS WITH OR WITHOUT FEVER  NAUSEA AND VOMITING THAT IS NOT CONTROLLED WITH YOUR NAUSEA MEDICATION  *UNUSUAL SHORTNESS OF BREATH  *UNUSUAL BRUISING OR BLEEDING  TENDERNESS IN MOUTH AND THROAT WITH OR WITHOUT PRESENCE OF ULCERS  *URINARY PROBLEMS  *BOWEL PROBLEMS  UNUSUAL RASH Items with * indicate a potential emergency and should be followed up as soon as possible.  Feel free to call the clinic you have any questions or concerns. The clinic phone number is [phone].

## 2014-07-28 ENCOUNTER — Ambulatory Visit (HOSPITAL_COMMUNITY)
Admission: RE | Admit: 2014-07-28 | Discharge: 2014-07-28 | Disposition: A | Discharge status: Home / Self Care | Payer: Medicare Other | Source: Ambulatory Visit | Attending: Radiology | Admitting: Radiology

## 2014-07-28 ENCOUNTER — Ambulatory Visit (HOSPITAL_COMMUNITY)
Admission: RE | Admit: 2014-07-28 | Discharge: 2014-07-28 | Disposition: A | Discharge status: Home / Self Care | Payer: Medicare Other | Source: Ambulatory Visit | Attending: Internal Medicine | Admitting: Internal Medicine

## 2014-07-28 DIAGNOSIS — J90 Pleural effusion, not elsewhere classified: Secondary | ICD-10-CM | POA: Insufficient documentation

## 2014-07-28 DIAGNOSIS — R0989 Other specified symptoms and signs involving the circulatory and respiratory systems: Secondary | ICD-10-CM | POA: Insufficient documentation

## 2014-07-28 DIAGNOSIS — C3490 Malignant neoplasm of unspecified part of unspecified bronchus or lung: Secondary | ICD-10-CM | POA: Insufficient documentation

## 2014-07-28 DIAGNOSIS — R0609 Other forms of dyspnea: Secondary | ICD-10-CM | POA: Insufficient documentation

## 2014-07-28 DIAGNOSIS — C3492 Malignant neoplasm of unspecified part of left bronchus or lung: Secondary | ICD-10-CM

## 2014-07-28 NOTE — Procedures (Signed)
US guided therapeutic left thoracentesis performed yielding 1.5 liters yellow fluid. No immediate complications. F/u CXR pending.

## 2014-08-03 ENCOUNTER — Other Ambulatory Visit (HOSPITAL_BASED_OUTPATIENT_CLINIC_OR_DEPARTMENT_OTHER): Payer: Medicare Other

## 2014-08-03 DIAGNOSIS — C3490 Malignant neoplasm of unspecified part of unspecified bronchus or lung: Secondary | ICD-10-CM

## 2014-08-03 DIAGNOSIS — D6481 Anemia due to antineoplastic chemotherapy: Secondary | ICD-10-CM

## 2014-08-03 DIAGNOSIS — T451X5A Adverse effect of antineoplastic and immunosuppressive drugs, initial encounter: Secondary | ICD-10-CM

## 2014-08-03 DIAGNOSIS — C3410 Malignant neoplasm of upper lobe, unspecified bronchus or lung: Secondary | ICD-10-CM

## 2014-08-03 LAB — CBC WITH DIFFERENTIAL/PLATELET
BASO%: 1.2 % (ref 0.0–2.0)
BASOS ABS: 0 10*3/uL (ref 0.0–0.1)
EOS ABS: 0 10*3/uL (ref 0.0–0.5)
EOS%: 1.2 % (ref 0.0–7.0)
HCT: 34.4 % — ABNORMAL LOW (ref 38.4–49.9)
HEMOGLOBIN: 11.5 g/dL — AB (ref 13.0–17.1)
LYMPH%: 29.1 % (ref 14.0–49.0)
MCH: 33.9 pg — ABNORMAL HIGH (ref 27.2–33.4)
MCHC: 33.5 g/dL (ref 32.0–36.0)
MCV: 101.1 fL — ABNORMAL HIGH (ref 79.3–98.0)
MONO#: 0.2 10*3/uL (ref 0.1–0.9)
MONO%: 12.6 % (ref 0.0–14.0)
NEUT%: 55.9 % (ref 39.0–75.0)
NEUTROS ABS: 0.8 10*3/uL — AB (ref 1.5–6.5)
Platelets: 110 10*3/uL — ABNORMAL LOW (ref 140–400)
RBC: 3.4 10*6/uL — ABNORMAL LOW (ref 4.20–5.82)
RDW: 16.5 % — AB (ref 11.0–14.6)
WBC: 1.5 10*3/uL — ABNORMAL LOW (ref 4.0–10.3)
lymph#: 0.4 10*3/uL — ABNORMAL LOW (ref 0.9–3.3)

## 2014-08-03 LAB — COMPREHENSIVE METABOLIC PANEL (CC13)
ALBUMIN: 3.5 g/dL (ref 3.5–5.0)
ALT: 19 U/L (ref 0–55)
AST: 20 U/L (ref 5–34)
Alkaline Phosphatase: 60 U/L (ref 40–150)
Anion Gap: 11 mEq/L (ref 3–11)
BUN: 18.9 mg/dL (ref 7.0–26.0)
CALCIUM: 8.3 mg/dL — AB (ref 8.4–10.4)
CO2: 27 mEq/L (ref 22–29)
Chloride: 102 mEq/L (ref 98–109)
Creatinine: 0.9 mg/dL (ref 0.7–1.3)
GLUCOSE: 220 mg/dL — AB (ref 70–140)
POTASSIUM: 4.1 meq/L (ref 3.5–5.1)
Sodium: 140 mEq/L (ref 136–145)
Total Bilirubin: 0.95 mg/dL (ref 0.20–1.20)
Total Protein: 5.8 g/dL — ABNORMAL LOW (ref 6.4–8.3)

## 2014-08-10 ENCOUNTER — Other Ambulatory Visit (HOSPITAL_BASED_OUTPATIENT_CLINIC_OR_DEPARTMENT_OTHER): Payer: Medicare Other

## 2014-08-10 DIAGNOSIS — C3410 Malignant neoplasm of upper lobe, unspecified bronchus or lung: Secondary | ICD-10-CM

## 2014-08-10 DIAGNOSIS — C3490 Malignant neoplasm of unspecified part of unspecified bronchus or lung: Secondary | ICD-10-CM

## 2014-08-10 LAB — CBC WITH DIFFERENTIAL/PLATELET
BASO%: 0 % (ref 0.0–2.0)
Basophils Absolute: 0 10*3/uL (ref 0.0–0.1)
EOS%: 1.4 % (ref 0.0–7.0)
Eosinophils Absolute: 0 10*3/uL (ref 0.0–0.5)
HCT: 30.9 % — ABNORMAL LOW (ref 38.4–49.9)
HGB: 10.5 g/dL — ABNORMAL LOW (ref 13.0–17.1)
LYMPH%: 22.7 % (ref 14.0–49.0)
MCH: 33.9 pg — ABNORMAL HIGH (ref 27.2–33.4)
MCHC: 34 g/dL (ref 32.0–36.0)
MCV: 99.7 fL — AB (ref 79.3–98.0)
MONO#: 0.2 10*3/uL (ref 0.1–0.9)
MONO%: 10.9 % (ref 0.0–14.0)
NEUT%: 65 % (ref 39.0–75.0)
NEUTROS ABS: 1.4 10*3/uL — AB (ref 1.5–6.5)
NRBC: 1 % — AB (ref 0–0)
Platelets: 52 10*3/uL — ABNORMAL LOW (ref 140–400)
RBC: 3.1 10*6/uL — AB (ref 4.20–5.82)
RDW: 16.1 % — AB (ref 11.0–14.6)
WBC: 2.2 10*3/uL — ABNORMAL LOW (ref 4.0–10.3)
lymph#: 0.5 10*3/uL — ABNORMAL LOW (ref 0.9–3.3)

## 2014-08-10 LAB — COMPREHENSIVE METABOLIC PANEL (CC13)
ALK PHOS: 63 U/L (ref 40–150)
ALT: 18 U/L (ref 0–55)
AST: 21 U/L (ref 5–34)
Albumin: 3.5 g/dL (ref 3.5–5.0)
Anion Gap: 8 mEq/L (ref 3–11)
BILIRUBIN TOTAL: 0.85 mg/dL (ref 0.20–1.20)
BUN: 16.6 mg/dL (ref 7.0–26.0)
CO2: 26 meq/L (ref 22–29)
Calcium: 8.4 mg/dL (ref 8.4–10.4)
Chloride: 105 mEq/L (ref 98–109)
Creatinine: 1 mg/dL (ref 0.7–1.3)
Glucose: 204 mg/dl — ABNORMAL HIGH (ref 70–140)
Potassium: 4.1 mEq/L (ref 3.5–5.1)
SODIUM: 139 meq/L (ref 136–145)
TOTAL PROTEIN: 5.8 g/dL — AB (ref 6.4–8.3)

## 2014-08-13 ENCOUNTER — Other Ambulatory Visit: Payer: Self-pay | Admitting: Oncology

## 2014-08-13 ENCOUNTER — Emergency Department (HOSPITAL_COMMUNITY): Payer: Medicare Other

## 2014-08-13 ENCOUNTER — Observation Stay (HOSPITAL_COMMUNITY)
Admission: EM | Admit: 2014-08-13 | Discharge: 2014-08-14 | Disposition: A | Discharge status: Home / Self Care | Payer: Medicare Other | Attending: Family Medicine | Admitting: Family Medicine

## 2014-08-13 ENCOUNTER — Encounter (HOSPITAL_COMMUNITY): Payer: Self-pay | Admitting: Emergency Medicine

## 2014-08-13 DIAGNOSIS — Z8505 Personal history of malignant neoplasm of liver: Secondary | ICD-10-CM | POA: Insufficient documentation

## 2014-08-13 DIAGNOSIS — Z85118 Personal history of other malignant neoplasm of bronchus and lung: Secondary | ICD-10-CM | POA: Diagnosis not present

## 2014-08-13 DIAGNOSIS — Z79899 Other long term (current) drug therapy: Secondary | ICD-10-CM | POA: Diagnosis not present

## 2014-08-13 DIAGNOSIS — I4891 Unspecified atrial fibrillation: Secondary | ICD-10-CM

## 2014-08-13 DIAGNOSIS — Z8639 Personal history of other endocrine, nutritional and metabolic disease: Secondary | ICD-10-CM

## 2014-08-13 DIAGNOSIS — Z0000 Encounter for general adult medical examination without abnormal findings: Secondary | ICD-10-CM

## 2014-08-13 DIAGNOSIS — R011 Cardiac murmur, unspecified: Secondary | ICD-10-CM

## 2014-08-13 DIAGNOSIS — R0902 Hypoxemia: Secondary | ICD-10-CM

## 2014-08-13 DIAGNOSIS — D696 Thrombocytopenia, unspecified: Secondary | ICD-10-CM

## 2014-08-13 DIAGNOSIS — H35039 Hypertensive retinopathy, unspecified eye: Secondary | ICD-10-CM | POA: Insufficient documentation

## 2014-08-13 DIAGNOSIS — N400 Enlarged prostate without lower urinary tract symptoms: Secondary | ICD-10-CM

## 2014-08-13 DIAGNOSIS — K769 Liver disease, unspecified: Secondary | ICD-10-CM

## 2014-08-13 DIAGNOSIS — E785 Hyperlipidemia, unspecified: Secondary | ICD-10-CM | POA: Diagnosis not present

## 2014-08-13 DIAGNOSIS — M25561 Pain in right knee: Secondary | ICD-10-CM

## 2014-08-13 DIAGNOSIS — K449 Diaphragmatic hernia without obstruction or gangrene: Secondary | ICD-10-CM | POA: Diagnosis not present

## 2014-08-13 DIAGNOSIS — I10 Essential (primary) hypertension: Secondary | ICD-10-CM | POA: Diagnosis not present

## 2014-08-13 DIAGNOSIS — E119 Type 2 diabetes mellitus without complications: Secondary | ICD-10-CM | POA: Diagnosis not present

## 2014-08-13 DIAGNOSIS — H3530 Unspecified macular degeneration: Secondary | ICD-10-CM | POA: Insufficient documentation

## 2014-08-13 DIAGNOSIS — R0609 Other forms of dyspnea: Secondary | ICD-10-CM

## 2014-08-13 DIAGNOSIS — J9819 Other pulmonary collapse: Secondary | ICD-10-CM | POA: Diagnosis not present

## 2014-08-13 DIAGNOSIS — K7689 Other specified diseases of liver: Secondary | ICD-10-CM

## 2014-08-13 DIAGNOSIS — R768 Other specified abnormal immunological findings in serum: Secondary | ICD-10-CM

## 2014-08-13 DIAGNOSIS — J90 Pleural effusion, not elsewhere classified: Secondary | ICD-10-CM | POA: Diagnosis not present

## 2014-08-13 DIAGNOSIS — T380X5A Adverse effect of glucocorticoids and synthetic analogues, initial encounter: Secondary | ICD-10-CM

## 2014-08-13 DIAGNOSIS — Z87891 Personal history of nicotine dependence: Secondary | ICD-10-CM

## 2014-08-13 DIAGNOSIS — K649 Unspecified hemorrhoids: Secondary | ICD-10-CM

## 2014-08-13 DIAGNOSIS — Z888 Allergy status to other drugs, medicaments and biological substances status: Secondary | ICD-10-CM | POA: Diagnosis not present

## 2014-08-13 DIAGNOSIS — K5730 Diverticulosis of large intestine without perforation or abscess without bleeding: Secondary | ICD-10-CM

## 2014-08-13 DIAGNOSIS — Z860100 Personal history of colon polyps, unspecified: Secondary | ICD-10-CM

## 2014-08-13 DIAGNOSIS — C3492 Malignant neoplasm of unspecified part of left bronchus or lung: Secondary | ICD-10-CM | POA: Diagnosis present

## 2014-08-13 DIAGNOSIS — C3490 Malignant neoplasm of unspecified part of unspecified bronchus or lung: Secondary | ICD-10-CM

## 2014-08-13 DIAGNOSIS — E099 Drug or chemical induced diabetes mellitus without complications: Secondary | ICD-10-CM | POA: Diagnosis present

## 2014-08-13 DIAGNOSIS — D751 Secondary polycythemia: Secondary | ICD-10-CM

## 2014-08-13 LAB — COMPREHENSIVE METABOLIC PANEL
ALK PHOS: 68 U/L (ref 39–117)
ALT: 15 U/L (ref 0–53)
ANION GAP: 14 (ref 5–15)
AST: 21 U/L (ref 0–37)
Albumin: 3.7 g/dL (ref 3.5–5.2)
BUN: 15 mg/dL (ref 6–23)
CALCIUM: 8.9 mg/dL (ref 8.4–10.5)
CO2: 25 meq/L (ref 19–32)
Chloride: 99 mEq/L (ref 96–112)
Creatinine, Ser: 0.81 mg/dL (ref 0.50–1.35)
GFR, EST NON AFRICAN AMERICAN: 86 mL/min — AB (ref >90)
GLUCOSE: 269 mg/dL — AB (ref 70–99)
POTASSIUM: 4.7 meq/L (ref 3.7–5.3)
Sodium: 138 mEq/L (ref 137–147)
Total Bilirubin: 1.3 mg/dL — ABNORMAL HIGH (ref 0.3–1.2)
Total Protein: 6.3 g/dL (ref 6.0–8.3)

## 2014-08-13 LAB — CBC WITH DIFFERENTIAL/PLATELET
BASOS ABS: 0 10*3/uL (ref 0.0–0.1)
Basophils Relative: 0 % (ref 0–1)
EOS ABS: 0 10*3/uL (ref 0.0–0.7)
Eosinophils Relative: 1 % (ref 0–5)
HCT: 34.5 % — ABNORMAL LOW (ref 39.0–52.0)
Hemoglobin: 11.8 g/dL — ABNORMAL LOW (ref 13.0–17.0)
LYMPHS ABS: 0.3 10*3/uL — AB (ref 0.7–4.0)
Lymphocytes Relative: 8 % — ABNORMAL LOW (ref 12–46)
MCH: 34.7 pg — ABNORMAL HIGH (ref 26.0–34.0)
MCHC: 34.2 g/dL (ref 30.0–36.0)
MCV: 101.5 fL — ABNORMAL HIGH (ref 78.0–100.0)
Monocytes Absolute: 0.6 10*3/uL (ref 0.1–1.0)
Monocytes Relative: 17 % — ABNORMAL HIGH (ref 3–12)
NEUTROS ABS: 2.4 10*3/uL (ref 1.7–7.7)
Neutrophils Relative %: 74 % (ref 43–77)
Platelets: 98 10*3/uL — ABNORMAL LOW (ref 150–400)
RBC: 3.4 MIL/uL — ABNORMAL LOW (ref 4.22–5.81)
RDW: 17.1 % — AB (ref 11.5–15.5)
WBC: 3.3 10*3/uL — ABNORMAL LOW (ref 4.0–10.5)

## 2014-08-13 LAB — PHOSPHORUS: PHOSPHORUS: 3 mg/dL (ref 2.3–4.6)

## 2014-08-13 LAB — I-STAT TROPONIN, ED: Troponin i, poc: 0 ng/mL (ref 0.00–0.08)

## 2014-08-13 LAB — PRO B NATRIURETIC PEPTIDE: Pro B Natriuretic peptide (BNP): 1771 pg/mL — ABNORMAL HIGH (ref 0–125)

## 2014-08-13 LAB — TROPONIN I: Troponin I: <0.3 ng/mL (ref <0.30)

## 2014-08-13 LAB — MAGNESIUM: MAGNESIUM: 1.9 mg/dL (ref 1.5–2.5)

## 2014-08-13 MED ORDER — SODIUM CHLORIDE 0.9 % IJ SOLN: 3.0000 mL | INTRAMUSCULAR | Status: DC | PRN

## 2014-08-13 MED ORDER — FOLIC ACID 1 MG PO TABS
1.0000 mg | ORAL_TABLET | Freq: Every day | ORAL | Status: DC
  Administered 2014-08-13 – 2014-08-14 (×2): 1 mg via ORAL
  Filled 2014-08-13 (×2): qty 1

## 2014-08-13 MED ORDER — INSULIN ASPART 100 UNIT/ML ~~LOC~~ SOLN
0.0000 [IU] | Freq: Three times a day (TID) | SUBCUTANEOUS | Status: DC
  Administered 2014-08-14: 2 [IU] via SUBCUTANEOUS
  Administered 2014-08-14: 3 [IU] via SUBCUTANEOUS

## 2014-08-13 MED ORDER — IOHEXOL 350 MG/ML SOLN
100.0000 mL | Freq: Once | INTRAVENOUS | Status: AC | PRN
  Administered 2014-08-13: 100 mL via INTRAVENOUS

## 2014-08-13 MED ORDER — SODIUM CHLORIDE 0.9 % IV SOLN: 250.0000 mL | INTRAVENOUS | Status: DC | PRN

## 2014-08-13 MED ORDER — AMLODIPINE BESYLATE 5 MG PO TABS
5.0000 mg | ORAL_TABLET | Freq: Every day | ORAL | Status: DC
  Administered 2014-08-13 – 2014-08-14 (×2): 5 mg via ORAL
  Filled 2014-08-13 (×3): qty 1

## 2014-08-13 MED ORDER — ATENOLOL 25 MG PO TABS
25.0000 mg | ORAL_TABLET | Freq: Two times a day (BID) | ORAL | Status: DC
  Administered 2014-08-13 – 2014-08-14 (×2): 25 mg via ORAL
  Filled 2014-08-13 (×5): qty 1

## 2014-08-13 MED ORDER — LORATADINE 10 MG PO TABS
10.0000 mg | ORAL_TABLET | Freq: Every day | ORAL | Status: DC
  Filled 2014-08-13: qty 1

## 2014-08-13 MED ORDER — ALBUTEROL SULFATE HFA 108 (90 BASE) MCG/ACT IN AERS
4.0000 | INHALATION_SPRAY | Freq: Once | RESPIRATORY_TRACT | Status: AC
  Administered 2014-08-13: 4 via RESPIRATORY_TRACT
  Filled 2014-08-13: qty 6.7

## 2014-08-13 MED ORDER — GUAIFENESIN ER 600 MG PO TB12
600.0000 mg | ORAL_TABLET | Freq: Two times a day (BID) | ORAL | Status: DC
  Administered 2014-08-13 – 2014-08-14 (×2): 600 mg via ORAL
  Filled 2014-08-13 (×5): qty 1

## 2014-08-13 MED ORDER — ALBUTEROL SULFATE (2.5 MG/3ML) 0.083% IN NEBU
2.5000 mg | INHALATION_SOLUTION | Freq: Four times a day (QID) | RESPIRATORY_TRACT | Status: DC | PRN
Start: 2014-08-13 — End: 2014-08-14
  Administered 2014-08-14: 2.5 mg via RESPIRATORY_TRACT
  Filled 2014-08-13: qty 3

## 2014-08-13 MED ORDER — SODIUM CHLORIDE 0.9 % IJ SOLN
3.0000 mL | Freq: Two times a day (BID) | INTRAMUSCULAR | Status: DC
  Administered 2014-08-13: 3 mL via INTRAVENOUS

## 2014-08-13 MED ORDER — SUCRALFATE 1 GM/10ML PO SUSP: 1.0000 g | Freq: Four times a day (QID) | ORAL | Status: DC | PRN

## 2014-08-13 MED ORDER — ALBUTEROL SULFATE HFA 108 (90 BASE) MCG/ACT IN AERS: 2.0000 | INHALATION_SPRAY | Freq: Four times a day (QID) | RESPIRATORY_TRACT | Status: DC | PRN

## 2014-08-13 MED ORDER — INSULIN ASPART 100 UNIT/ML ~~LOC~~ SOLN: 0.0000 [IU] | Freq: Every day | SUBCUTANEOUS | Status: DC

## 2014-08-13 MED ORDER — SODIUM CHLORIDE 0.9 % IJ SOLN
3.0000 mL | Freq: Two times a day (BID) | INTRAMUSCULAR | Status: DC
  Administered 2014-08-14: 3 mL via INTRAVENOUS

## 2014-08-13 MED ORDER — METHOCARBAMOL 500 MG PO TABS: 500.0000 mg | ORAL_TABLET | Freq: Three times a day (TID) | ORAL | Status: DC | PRN

## 2014-08-13 MED ORDER — ACETAMINOPHEN 650 MG RE SUPP: 650.0000 mg | Freq: Four times a day (QID) | RECTAL | Status: DC | PRN

## 2014-08-13 MED ORDER — APIXABAN 5 MG PO TABS
5.0000 mg | ORAL_TABLET | Freq: Two times a day (BID) | ORAL | Status: DC
  Administered 2014-08-13 – 2014-08-14 (×2): 5 mg via ORAL
  Filled 2014-08-13 (×4): qty 1

## 2014-08-13 MED ORDER — PROCHLORPERAZINE MALEATE 10 MG PO TABS: 10.0000 mg | ORAL_TABLET | Freq: Four times a day (QID) | ORAL | Status: DC | PRN

## 2014-08-13 MED ORDER — ACETAMINOPHEN 325 MG PO TABS: 650.0000 mg | ORAL_TABLET | Freq: Four times a day (QID) | ORAL | Status: DC | PRN

## 2014-08-13 NOTE — ED Notes (Signed)
MD placed pt on 2 lpm [place] for comfort. Pt 96% on RA.

## 2014-08-13 NOTE — H&P (Signed)
[facility] History and Physical  Shawn Espinoza QQP:[id number] DOB: [birth year] DOA: 08/13/2014  Referring physician: Dr. Stevie Kern PCP: [name], MD   Chief Complaint: Worsening shortness of breath  HPI: Shawn Espinoza is a 73 y.o. male  With history of non-small cell left lung cancer with metastases followed by Dr. [name]. Who presents to the ED complaining of worsening shortness of breath. Reportedly patient has required multiple thoracentesis. The patient reports around every 2 weeks he requires repeat thoracentesis for symptom improvement. The problem since onset has been getting worse and given his persistent shortness of breath he decided to come to the ED for further evaluation.  CT scan of the chest was negative for PE but did report large left pleural effusion. EKG evaluation reported new onset atrial fibrillation. The patient reports any history of atrial fibrillation.'s were subsequently consulted for further evaluation recommendations given the above findings.   Review of Systems:  Constitutional:  No weight loss, night sweats, Fevers, chills, fatigue.  HEENT:  No headaches, Difficulty swallowing,Tooth/dental problems,Sore throat,  No sneezing, itching, ear ache, nasal congestion, post nasal drip,  Cardio-vascular:  No chest pain, Orthopnea, PND, + swelling in lower extremities, anasarca, dizziness, palpitations  GI:  No heartburn, indigestion, abdominal pain, nausea, vomiting, diarrhea, change in bowel habits, loss of appetite  Resp:  + shortness of breath with exertion or at rest. No excess mucus, + productive cough, No non-productive cough, No coughing up of blood.No change in color of mucus.No wheezing.No chest wall deformity  Skin:  no rash or lesions.  GU:  no dysuria, change in color of urine, no urgency or frequency. No flank pain.  Musculoskeletal:  No joint pain or swelling. No decreased range of motion. No back pain.  Psych:  No change  in mood or affect. No depression or anxiety. No memory loss.   Past Medical History  Diagnosis Date  . History of diabetes mellitus, type II     resolved with diet, h/o neuropathy  . Diverticulosis of colon   . Hyperlipidemia   . Hypertension   . Fatty liver 02/29/00    abd ultrasound  . Lower back pain   . Systolic murmur 7124    2Decho - normal LV fxn, EF 55%, mild AS, biatrial enlargement  . History of tobacco use quit 1990s  . 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 Personal history of colonic adenomas 07/06/2013  . Hypertensive retinopathy of both eyes 2015    mild  . 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 Colonoscopy  2004  . Colonoscopy  2014    tubular adenoma x1, mod diverticulosis (Gessner)  . Flexible bronchoscopy  02/2014    WNL  . Video bronchoscopy Bilateral 03/17/2014    Procedure: VIDEO BRONCHOSCOPY WITH FLUORO;  Surgeon: Tanda Rockers, MD;  Location: [facility];  Service: Cardiopulmonary;  Laterality: Bilateral;   Social History:  reports that he quit smoking about 25 years ago. His smoking use included Cigarettes. He has a 90 pack-year smoking history. He has never used smokeless tobacco. He reports that he drinks about 12.6 ounces of alcohol per week. He reports that he does not use illicit drugs.  Allergies  Allergen Reactions  . Candesartan Cilexetil     REACTION:  Muscle spasms  . Diltiazem Hcl     REACTION: Dizziness  . Doxazosin Mesylate Other (See Comments)    REACTION: H/A's  . Nifedipine     REACTION: Leg swelling  . Pravastatin Other (See Comments)    Muscle aches  . Red Yeast Rice     Muscle aches  . Rosuvastatin Other (See Comments)    REACTION: questionable: severe constipation  . Simvastatin Other (See Comments)    REACTION: Muscle aches      Family History  Problem Relation Age of Onset  . Stroke Mother     multiple mini strokes  . Hypertension Mother   . Heart disease Maternal Grandfather     MI  . Stroke Paternal Grandmother   . Colon cancer Neg Hx      Prior to Admission medications   Medication Sig Start Date End Date Taking? Authorizing Provider  albuterol (PROVENTIL HFA;VENTOLIN HFA) 108 (90 BASE) MCG/ACT inhaler Inhale 2 puffs into the lungs every 6 (six) hours as needed for wheezing or shortness of breath. 06/15/14  Yes Adrena E Johnson, PA-C  Alum & Mag Hydroxide-Simeth (MAGIC MOUTHWASH W/LIDOCAINE) SOLN Take 5 mLs by mouth 3 (three) times daily as needed for mouth pain. 06/01/14  Yes Bill Salinas, NP  amLODipine (NORVASC) 5 MG tablet Take 1 tablet (5 mg total) by mouth daily. 04/15/14  Yes [name], MD  atenolol (TENORMIN) 25 MG tablet Take 1 tablet (25 mg total) by mouth 2 (two) times daily. 04/15/14  Yes [name], MD  cetirizine (ZYRTEC) 10 MG tablet Take 10 mg by mouth daily as needed for allergies or rhinitis.    Yes Historical Provider, MD  cholecalciferol (VITAMIN D) 1000 UNITS tablet Take 1,000 Units by mouth every morning.    Yes Historical Provider, MD  dexamethasone (DECADRON) 4 MG tablet 4 mg by mouth twice a day the day before, day of and day after the chemotherapy every 3 weeks. 04/08/14  Yes Curt Bears, MD  folic acid (FOLVITE) 1 MG tablet Take 1 tablet (1 mg total) by mouth daily. 04/08/14  Yes Curt Bears, MD  glimepiride (AMARYL) 1 MG tablet Take 1 tablet (1 mg total) by mouth as directed. Take on days you take decadron. 07/20/14  Yes [name], MD  guaiFENesin (MUCINEX) 600 MG 12 hr tablet Take 600 mg by mouth 2 (two) times daily.    Yes Historical Provider, MD  ibuprofen (ADVIL,MOTRIN) 800 MG tablet Take 800 mg by mouth 2 (two) times daily as needed for moderate pain.   Yes Historical Provider, MD  methocarbamol (ROBAXIN) 500 MG tablet Take 500 mg by mouth every 8  (eight) hours as needed (pain).  11/12/13  Yes [name], MD  Milk Thistle 200 MG CAPS Take 1 capsule by mouth every morning.    Yes Historical Provider, MD  Multiple Vitamins-Minerals (EYE VITAMINS PO) Take 1 capsule by mouth 2 (two) times daily. Due to macular degeneration   Yes Historical Provider, MD  prochlorperazine (COMPAZINE) 10 MG tablet Take 1 tablet (10 mg total) by mouth every 6 (six) hours as needed for nausea or vomiting. 04/08/14  Yes Curt Bears, MD  sucralfate (CARAFATE) 1 GM/10ML suspension Take 1 g by mouth 4 (four) times daily as needed (reflux).   Yes Historical Provider, MD   Physical Exam: Filed Vitals:   08/13/14 1223 08/13/14 1349 08/13/14 1615 08/13/14 1745  BP:  122/67 145/90 142/76  Pulse:  83 82 88  Temp:   98.4 F (  36.9 C)   TempSrc:   Oral   Resp: 26 24 22 22   SpO2: 96% 99% 96% 100%    Wt Readings from Last 3 Encounters:  07/25/14 85.049 kg (187 lb 8 oz)  07/20/14 83.575 kg (184 lb 4 oz)  07/06/14 83.689 kg (184 lb 8 oz)    General:  Appears calm and comfortable Eyes: PERRL, normal lids, irises & conjunctiva ENT: grossly normal hearing, lips & tongue Neck: no LAD, masses or thyromegaly Cardiovascular: Irregularly irregular rate, + LE edema. Telemetry: SR, no arrhythmias  Respiratory: Decreased breath sounds over left lung fields, no wheezes, positive increased work of breathing Abdomen: soft, nt, nd Skin: no rash or induration seen on limited exam Musculoskeletal: grossly normal tone BUE/BLE Psychiatric: grossly normal mood and affect, speech fluent and appropriate Neurologic: Answers questions appropriately, moves all extremities equally.          Labs on Admission:  Basic Metabolic Panel:  Recent Labs Lab 08/10/14 1019 08/13/14 1222  NA 139 138  K 4.1 4.7  CL  --  99  CO2 26 25  GLUCOSE 204* 269*  BUN 16.6 15  CREATININE 1.0 0.81  CALCIUM 8.4 8.9   Liver Function Tests:  Recent Labs Lab 08/10/14 1019 08/13/14 1222    AST 21 21  ALT 18 15  ALKPHOS 63 68  BILITOT 0.85 1.3*  PROT 5.8* 6.3  ALBUMIN 3.5 3.7   No results found for this basename: LIPASE, AMYLASE,  in the last 168 hours No results found for this basename: AMMONIA,  in the last 168 hours CBC:  Recent Labs Lab 08/10/14 1019 08/13/14 1222  WBC 2.2* 3.3*  NEUTROABS 1.4* 2.4  HGB 10.5* 11.8*  HCT 30.9* 34.5*  MCV 99.7* 101.5*  PLT 52* 98*   Cardiac Enzymes: No results found for this basename: CKTOTAL, CKMB, CKMBINDEX, TROPONINI,  in the last 168 hours  BNP (last 3 results)  Recent Labs  02/22/14 1314 08/13/14 1222  PROBNP 61.0 1771.0*   CBG: No results found for this basename: GLUCAP,  in the last 168 hours  Radiological Exams on Admission: Dg Chest 2 View  08/13/2014   CLINICAL DATA:  Shortness of breath  EXAM: CHEST  2 VIEW  COMPARISON:  07/28/2014.  FINDINGS: Normal heart size. Atherosclerotic disease involves the aortic arch. There is a moderate left pleural effusion which is similar in volume to the previous exam. Overlying consolidation and atelectasis is noted. Right lung appears clear.  IMPRESSION: 1. No significant change in volume of left pleural effusion with overlying atelectasis/consolidation.   Electronically Signed   By: Kerby Moors M.D.   On: 08/13/2014 13:38   Ct Angio Chest Pe W/cm &/or Wo Cm  08/13/2014   CLINICAL DATA:  Shortness of breath with productive cough; history of lung malignancy with most recent chemotherapy 3 weeks ago  EXAM: CT ANGIOGRAPHY CHEST WITH CONTRAST  TECHNIQUE: Multidetector CT imaging of the chest was performed using the standard protocol during bolus administration of intravenous contrast. Multiplanar CT image reconstructions and MIPs were obtained to evaluate the vascular anatomy.  CONTRAST:  175mL OMNIPAQUE IOHEXOL 350 MG/ML SOLN  COMPARISON:  PA and lateral chest x-ray of today's date as well as a frontal chest X ray of July 28, 2014 and CT scan of the thorax of June 08, 2014.   FINDINGS: There is a to large left-sided pleural effusion which has increased since August 6. There is a tiny right pleural effusion layering posteriorly.  Contrast within  the pulmonary arterial tree is normal. There are no filling defects to suggest an acute pulmonary embolism. The caliber of the thoracic aorta is normal. There is no false lumen. There is a small hiatal hernia. The cardiac chambers are normal in size. There are coronary artery calcifications. There is a stable enlarged right hilar lymph node measuring 14 cm in short axis.  Within the upper abdomen there are abnormal hypodensities in the right hepatic lobe consistent with known metastatic disease. There are no adrenal masses. The observed portions of the bony thorax exhibit no acute abnormalities.  Review of the MIP images confirms the above findings.  IMPRESSION: 1. There is no acute pulmonary embolism nor acute thoracic aortic pathology. 2. There is a moderate to large left-sided pleural effusion which is increased since the chest x-ray of July 27, 2014. There is a tiny right pleural effusion. 3. Known hepatic metastatic disease.   Electronically Signed   By: David  [place]   On: 08/13/2014 16:41    EKG: Independently reviewed. Atrial fibrillation rate controlled  Assessment/Plan  New onset atrial fibrillation - Rate controlled on beta blocker will continue beta blocker - CHADS score 2 we'll place on Eliquis per pharmacy consult - Troponins q. 6 hours x3 -Echocardiogram -Telemetry monitoring  Active Problems:   History of diabetes mellitus, type II - Diabetic diet - Sliding scale insulin moderate scale    HYPERTENSION - Continue beta blocker amlodipine    Adenocarcinoma of lung - Patient to followup with oncologist after discharge    Pleural effusion  - ED physician consulted IR for consideration of thoracentesis   Code Status: full DVT Prophylaxis: start patient on anticoagulation with Eliquis  Family Communication:  d/c wife at bedside Disposition Plan: Telemetry  Time spent: > 45 minutes  Velvet Bathe [facility] Pager [phone]  **Disclaimer: This note may have been dictated with voice recognition software. Similar sounding words can inadvertently be transcribed and this note may contain transcription errors which may not have been corrected upon publication of note.**

## 2014-08-13 NOTE — ED Notes (Signed)
Pt ambulates to bathroom without assistant.  No acute distress.

## 2014-08-13 NOTE — ED Provider Notes (Signed)
CSN: [id number]     Arrival date & time 08/13/14  1139 History   First MD Initiated Contact with Patient 08/13/14 1200     Chief Complaint  Patient presents with  . Shortness of Breath     (Consider location/radiation/quality/duration/timing/severity/associated sxs/prior Treatment) Patient is a 73 y.o. male presenting with shortness of breath. The history is provided by the patient. No language interpreter was used.  Shortness of Breath Severity:  Moderate Onset quality:  Gradual Timing:  Constant Progression:  Worsening Chronicity:  Chronic Relieved by: better for a "few days" after thoracentesis. Worsened by:  Movement and exertion Ineffective treatments:  None tried Associated symptoms: cough, fever and sputum production   Associated symptoms: no abdominal pain, no chest pain, no claudication, no diaphoresis, no headaches, no rash and no vomiting   Cough:    Cough characteristics:  Productive   Sputum characteristics: tan.   Severity:  Moderate   Timing:  Constant   Progression:  Worsening   Chronicity:  Chronic Risk factors comment:  Hx of SSLC   Past Medical History  Diagnosis Date  . History of diabetes mellitus, type II     resolved with diet, h/o neuropathy  . Diverticulosis of colon   . Hyperlipidemia   . Hypertension   . Fatty liver 02/29/00    abd ultrasound  . Lower back pain   . Systolic murmur 6387    2Decho - normal LV fxn, EF 55%, mild AS, biatrial enlargement  . History of tobacco use quit 1990s  . 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 Personal history of colonic adenomas 07/06/2013  . Hypertensive retinopathy of both eyes 2015    mild  . 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 Colonoscopy  2004  . Colonoscopy  2014    tubular adenoma x1, mod  diverticulosis ([place])  . Flexible bronchoscopy  02/2014    WNL  . Video bronchoscopy Bilateral 03/17/2014    Procedure: VIDEO BRONCHOSCOPY WITH FLUORO;  Surgeon: Tanda Rockers, MD;  Location: [facility];  Service: Cardiopulmonary;  Laterality: Bilateral;   Family History  Problem Relation Age of Onset  . Stroke Mother     multiple mini strokes  . Hypertension Mother   . Heart disease Maternal Grandfather     MI  . Stroke Paternal Grandmother   . Colon cancer Neg Hx    History  Substance Use Topics  . Smoking status: Former Smoker -- 3.00 packs/day for 30 years    Types: Cigarettes    Quit date: 12/23/1988  . Smokeless tobacco: Never Used  . Alcohol Use: 12.6 oz/week    21 Glasses of wine per week     Comment: 3-4 wine a day ( 8/03- 2 beers, 2 wines, 2 brandies daily)    Review of Systems  Constitutional: Positive for fever. Negative for diaphoresis, activity change, appetite change and fatigue.  HENT: Negative for congestion, facial swelling, rhinorrhea and trouble swallowing.   Eyes: Negative for photophobia and pain.  Respiratory: Positive for cough, sputum production and shortness of breath. Negative for chest tightness.   Cardiovascular: Negative for chest pain, claudication and leg swelling.  Gastrointestinal: Negative for nausea, vomiting, abdominal pain, diarrhea and constipation.  Endocrine: Negative for polydipsia and polyuria.  Genitourinary: Negative for dysuria, urgency, decreased urine volume and difficulty urinating.  Musculoskeletal: Negative for back pain and gait problem.  Skin: Negative for color change, rash and wound.  Allergic/Immunologic: Negative for immunocompromised state.  Neurological: Negative for dizziness, facial asymmetry, speech difficulty, weakness, numbness and headaches.  Psychiatric/Behavioral: Negative for confusion, decreased concentration and agitation.      Allergies  Candesartan cilexetil; Diltiazem hcl; Doxazosin mesylate;  Nifedipine; Pravastatin; Red yeast rice; Rosuvastatin; and Simvastatin  Home Medications   Prior to Admission medications   Medication Sig Start Date End Date Taking? Authorizing Provider  albuterol (PROVENTIL HFA;VENTOLIN HFA) 108 (90 BASE) MCG/ACT inhaler Inhale 2 puffs into the lungs every 6 (six) hours as needed for wheezing or shortness of breath. 06/15/14  Yes Adrena E Johnson, PA-C  Alum & Mag Hydroxide-Simeth (MAGIC MOUTHWASH W/LIDOCAINE) SOLN Take 5 mLs by mouth 3 (three) times daily as needed for mouth pain. 06/01/14  Yes Bill Salinas, NP  amLODipine (NORVASC) 5 MG tablet Take 1 tablet (5 mg total) by mouth daily. 04/15/14  Yes [name], MD  atenolol (TENORMIN) 25 MG tablet Take 1 tablet (25 mg total) by mouth 2 (two) times daily. 04/15/14  Yes [name], MD  cetirizine (ZYRTEC) 10 MG tablet Take 10 mg by mouth daily as needed for allergies or rhinitis.    Yes Historical Provider, MD  cholecalciferol (VITAMIN D) 1000 UNITS tablet Take 1,000 Units by mouth every morning.    Yes Historical Provider, MD  dexamethasone (DECADRON) 4 MG tablet 4 mg by mouth twice a day the day before, day of and day after the chemotherapy every 3 weeks. 04/08/14  Yes Curt Bears, MD  folic acid (FOLVITE) 1 MG tablet Take 1 tablet (1 mg total) by mouth daily. 04/08/14  Yes Curt Bears, MD  glimepiride (AMARYL) 1 MG tablet Take 1 tablet (1 mg total) by mouth as directed. Take on days you take decadron. 07/20/14  Yes [name], MD  guaiFENesin (MUCINEX) 600 MG 12 hr tablet Take 600 mg by mouth 2 (two) times daily.    Yes Historical Provider, MD  ibuprofen (ADVIL,MOTRIN) 800 MG tablet Take 800 mg by mouth 2 (two) times daily as needed for moderate pain.   Yes Historical Provider, MD  methocarbamol (ROBAXIN) 500 MG tablet Take 500 mg by mouth every 8 (eight) hours as needed (pain).  11/12/13  Yes [name], MD  Milk Thistle 200 MG CAPS Take 1 capsule by mouth every morning.    Yes  Historical Provider, MD  Multiple Vitamins-Minerals (EYE VITAMINS PO) Take 1 capsule by mouth 2 (two) times daily. Due to macular degeneration   Yes Historical Provider, MD  prochlorperazine (COMPAZINE) 10 MG tablet Take 1 tablet (10 mg total) by mouth every 6 (six) hours as needed for nausea or vomiting. 04/08/14  Yes Curt Bears, MD  sucralfate (CARAFATE) 1 GM/10ML suspension Take 1 g by mouth 4 (four) times daily as needed (reflux).   Yes Historical Provider, MD   BP 145/90  Pulse 82  Temp(Src) 98.4 F (36.9 C) (Oral)  Resp 22  SpO2 96% Physical Exam  Constitutional: He is oriented to person, place, and time. He appears well-developed and well-nourished. No distress.  HENT:  Head: Normocephalic and atraumatic.  Mouth/Throat: No oropharyngeal exudate.  Eyes: Pupils are equal, round, and reactive to light.  Neck: Normal range of motion. Neck supple.  Cardiovascular: Normal rate, regular rhythm and normal heart sounds.  Exam reveals no gallop and no friction rub.  No murmur heard. Pulmonary/Chest: Tachypnea noted. No respiratory distress. He has decreased breath sounds in the left upper field, the left middle field and the left lower field. He has no wheezes. He has no rales.  Abdominal: Soft. Bowel sounds are normal. He exhibits distension. He exhibits no mass. There is no tenderness. There is no rebound and no guarding.  Musculoskeletal: Normal range of motion. He exhibits edema. He exhibits no tenderness.  Neurological: He is alert and oriented to person, place, and time.  Skin: Skin is warm and dry.  Psychiatric: He has a normal mood and affect.    ED Course  Procedures (including critical care time) Labs Review Labs Reviewed  CBC WITH DIFFERENTIAL - Abnormal; Notable for the following:    WBC 3.3 (*)    RBC 3.40 (*)    Hemoglobin 11.8 (*)    HCT 34.5 (*)    MCV 101.5 (*)    MCH 34.7 (*)    RDW 17.1 (*)    Platelets 98 (*)    Lymphocytes Relative 8 (*)    Monocytes  Relative 17 (*)    Lymphs Abs 0.3 (*)    All other components within normal limits  COMPREHENSIVE METABOLIC PANEL - Abnormal; Notable for the following:    Glucose, Bld 269 (*)    Total Bilirubin 1.3 (*)    GFR calc non Af Amer 86 (*)    All other components within normal limits  PRO B NATRIURETIC PEPTIDE - Abnormal; Notable for the following:    Pro B Natriuretic peptide (BNP) 1771.0 (*)    All other components within normal limits  I-STAT TROPOININ, ED  Randolm Idol, ED    Imaging Review Dg Chest 2 View  08/13/2014   CLINICAL DATA:  Shortness of breath  EXAM: CHEST  2 VIEW  COMPARISON:  07/28/2014.  FINDINGS: Normal heart size. Atherosclerotic disease involves the aortic arch. There is a moderate left pleural effusion which is similar in volume to the previous exam. Overlying consolidation and atelectasis is noted. Right lung appears clear.  IMPRESSION: 1. No significant change in volume of left pleural effusion with overlying atelectasis/consolidation.   Electronically Signed   By: Kerby Moors M.D.   On: 08/13/2014 13:38     EKG Interpretation None      MDM   Final diagnoses:  None    Pt is a 73 y.o. male with Pmhx as above including stage III/IV SSLC with recurrent L pleural effusion, who presents with inc SOB since last night with worsening cough, oral temp of 99.9, BLLE edema, inc abdominal girth. ON PE, pt tachypnea, but in NAD. 96% on RA at rest, but drops to upper 80's with talking/cough.  He has dec breath sounds on L, 1+ BLLE edema, distended, non-tender abdomen.   W/u shows neg trop, elevated BNP, similar effusion, but with overlying atelectasis/consolidation. Given new onset afib w/ hypoxia, CTA ordered. Pt will likely need admission, nonemergent thoracentesis, and to be started on anticoagulants. Dr. Stevie Kern will f/u on CTA.         Ernestina Patches, MD 08/13/14 [phone]

## 2014-08-13 NOTE — ED Notes (Signed)
Pt given [place] sandwich and water per Stevie Kern MD okay.

## 2014-08-13 NOTE — ED Notes (Signed)
Pt from home c/o SOB with thick, productive tan mucous. Pt has hx of Lung CA with last chemo 3 weeks ago. Pt reports that he is having fluid drained every 2 weeks.Pt denies pain and has no other c/o.  Pt is A&O and in NAD.

## 2014-08-13 NOTE — ED Notes (Signed)
EKG was given to [facility] @ 1210, NOT 1223. Was just charted at 1223.

## 2014-08-13 NOTE — ED Provider Notes (Signed)
D/w [facility] for admit; recurrent symptomatic pleural effusion with hypoxia, new onset atrial fibrillation with rate less than 100, discussed with interventional radiology who will perform ultrasound-guided thoracentesis tomorrow.  Babette Relic, MD 08/23/14 [phone]

## 2014-08-14 ENCOUNTER — Inpatient Hospital Stay (HOSPITAL_COMMUNITY): Payer: Medicare Other

## 2014-08-14 DIAGNOSIS — I359 Nonrheumatic aortic valve disorder, unspecified: Secondary | ICD-10-CM

## 2014-08-14 LAB — CBC
HEMATOCRIT: 31 % — AB (ref 39.0–52.0)
Hemoglobin: 10.2 g/dL — ABNORMAL LOW (ref 13.0–17.0)
MCH: 33.8 pg (ref 26.0–34.0)
MCHC: 32.9 g/dL (ref 30.0–36.0)
MCV: 102.6 fL — ABNORMAL HIGH (ref 78.0–100.0)
Platelets: 89 10*3/uL — ABNORMAL LOW (ref 150–400)
RBC: 3.02 MIL/uL — ABNORMAL LOW (ref 4.22–5.81)
RDW: 17 % — AB (ref 11.5–15.5)
WBC: 3.1 10*3/uL — ABNORMAL LOW (ref 4.0–10.5)

## 2014-08-14 LAB — BASIC METABOLIC PANEL
Anion gap: 7 (ref 5–15)
BUN: 13 mg/dL (ref 6–23)
CALCIUM: 8.7 mg/dL (ref 8.4–10.5)
CO2: 31 mEq/L (ref 19–32)
CREATININE: 0.78 mg/dL (ref 0.50–1.35)
Chloride: 104 mEq/L (ref 96–112)
GFR calc Af Amer: >90 mL/min (ref >90)
GFR calc non Af Amer: 87 mL/min — ABNORMAL LOW (ref >90)
Glucose, Bld: 136 mg/dL — ABNORMAL HIGH (ref 70–99)
Potassium: 4.7 mEq/L (ref 3.7–5.3)
Sodium: 142 mEq/L (ref 137–147)

## 2014-08-14 LAB — TROPONIN I
Troponin I: <0.3 ng/mL (ref <0.30)
Troponin I: <0.3 ng/mL (ref <0.30)

## 2014-08-14 LAB — GLUCOSE, CAPILLARY
Glucose-Capillary: 177 mg/dL — ABNORMAL HIGH (ref 70–99)
Glucose-Capillary: 133 mg/dL — ABNORMAL HIGH (ref 70–99)
Glucose-Capillary: 155 mg/dL — ABNORMAL HIGH (ref 70–99)

## 2014-08-14 MED ORDER — APIXABAN 5 MG PO TABS: 5.0000 mg | ORAL_TABLET | Freq: Two times a day (BID) | ORAL | Status: DC

## 2014-08-14 NOTE — Discharge Instructions (Signed)
Information on my medicine - ELIQUIS (apixaban)  This medication education was reviewed with me or my healthcare representative as part of my discharge preparation.  The pharmacist that spoke with me during my hospital stay was:  Lolita Patella, [facility]  Why was Eliquis prescribed for you? Eliquis was prescribed for you to reduce the risk of a blood clot forming that can cause a stroke if you have a medical condition called atrial fibrillation (a type of irregular heartbeat).  What do You need to know about Eliquis ? Take your Eliquis TWICE DAILY - one tablet in the morning and one tablet in the evening with or without food. If you have difficulty swallowing the tablet whole please discuss with your pharmacist how to take the medication safely.  Take Eliquis exactly as prescribed by your doctor and DO NOT stop taking Eliquis without talking to the doctor who prescribed the medication.  Stopping may increase your risk of developing a stroke.  Refill your prescription before you run out.  After discharge, you should have regular check-up appointments with your healthcare provider that is prescribing your Eliquis.  In the future your dose may need to be changed if your kidney function or weight changes by a significant amount or as you get older.  What do you do if you miss a dose? If you miss a dose, take it as soon as you remember on the same day and resume taking twice daily.  Do not take more than one dose of ELIQUIS at the same time to make up a missed dose.  Important Safety Information A possible side effect of Eliquis is bleeding. You should call your healthcare provider right away if you experience any of the following:   Bleeding from an injury or your nose that does not stop.   Unusual colored urine (red or dark brown) or unusual colored stools (red or black).   Unusual bruising for unknown reasons.   A serious fall or if you hit your head (even if there is no  bleeding).  Some medicines may interact with Eliquis and might increase your risk of bleeding or clotting while on Eliquis. To help avoid this, consult your healthcare provider or pharmacist prior to using any new prescription or non-prescription medications, including herbals, vitamins, non-steroidal anti-inflammatory drugs (NSAIDs) and supplements.  This website has more information on Eliquis (apixaban): http://www.eliquis.com/eliquis/home

## 2014-08-14 NOTE — Procedures (Signed)
  US guided L thora 1.5 liters Yellow fluid  cxr pending

## 2014-08-14 NOTE — Progress Notes (Signed)
Echocardiogram 2D Echocardiogram has been performed.  Doyle Askew 08/14/2014, 10:08 AM

## 2014-08-14 NOTE — Care Management Note (Signed)
    Page 1 of 1   08/14/2014     2:35:25 PM CARE MANAGEMENT NOTE 08/14/2014  Patient:  Shawn Espinoza, Shawn Espinoza   Account Number:  [id number]  Date Initiated:  08/14/2014  Documentation initiated by:  [facility]  Subjective/Objective Assessment:   adm: Atrial fibrillation     Action/Plan:   discharge planning   Anticipated DC Date:  08/14/2014   Anticipated DC Plan:  HOME/SELF CARE         Choice offered to / List presented to:  C-1 Patient   DME arranged  [facility]      DME agency  [place].        Status of service:  Completed, signed off Medicare Important Message given?  YES (If response is "NO", the following Medicare IM given date fields will be blank) Date Medicare IM given:  08/13/2014 Medicare IM given by:   Date Additional Medicare IM given:   Additional Medicare IM given by:    Discharge Disposition:  HOME/SELF CARE  Per UR Regulation:    If discussed at Long Length of Stay Meetings, dates discussed:    Comments:  08/14/14 14:30 CM received call from RN to arange for home oxygen.  CM called DME rep, who states he will deliver to pt's room prior to discharge .  No other CM needs were communicated.  Mariane Masters, BSN, [place].

## 2014-08-14 NOTE — Discharge Summary (Signed)
Physician Discharge Summary  Shawn Espinoza:[id number] DOB: 1941/07/29 DOA: 08/13/2014  PCP: [name], MD  Admit date: 08/13/2014 Discharge date: 08/14/2014  Time spent: > 35 minutes  Recommendations for Outpatient Follow-up:  1. Please be sure to f/u with wbc levels 2. Consider referring patient to pulmonologist for further plans plans as outpatient regarding recurrent pleural effusions 3. New onset Atrial fibrillation: started Eliquis (may be discontinued 2 days prior to next thoracentesis)  Discharge Diagnoses:  Active Problems:   History of diabetes mellitus, type II   HYPERTENSION   Adenocarcinoma of lung   Pleural effusion   New onset atrial fibrillation   Discharge Condition: stable  Diet recommendation: diabetic diet  Filed Weights   08/13/14 2050  Weight: 84.188 kg (185 lb 9.6 oz)    History of present illness:  From original HPI: Shawn Espinoza is a 74 y.o. 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 Who presents to the ED complaining of worsening shortness of breath. Reportedly patient has required multiple thoracentesis   Hospital Course:  Pleural effusion - IR successfully drained 1.5 liters of yellow fluid - f/u chest x ray reported small residual portal effusion and no pneumothorax - Pt had desaturation with activity below 88% will order DME oxygen  New onset Atrial fibrillation - Troponins negative - Echocardiogram showing normal EF of 55-60 percent - Eliquis started this admission to protect from strokes given CHADs score of 2  Procedures: As listed below  Consultations:  None  Discharge Exam: Filed Vitals:   08/14/14 1102  BP: 125/67  Pulse:   Temp:   Resp:     General: Pt in nad, alert and awake Cardiovascular: irregularly irregular, no rubs Respiratory: CTA BL, no wheezes  Discharge Instructions You were cared for by a hospitalist during your hospital stay. If  you have any questions about your discharge medications or the care you received while you were in the hospital after you are discharged, you can call the unit and asked to speak with the hospitalist on call if the hospitalist that took care of you is not available. Once you are discharged, your primary care physician will handle any further medical issues. Please note that NO REFILLS for any discharge medications will be authorized once you are discharged, as it is imperative that you return to your primary care physician (or establish a relationship with a primary care physician if you do not have one) for your aftercare needs so that they can reassess your need for medications and monitor your lab values.  Discharge Instructions   Call MD for:  difficulty breathing, headache or visual disturbances    Complete by:  As directed      Call MD for:  temperature >100.4    Complete by:  As directed      Diet - low sodium heart healthy    Complete by:  As directed      Discharge instructions    Complete by:  As directed   Please be sure to follow up with your pcp within the next 1-2 weeks for further evaluation and recommendations.     Increase activity slowly    Complete by:  As directed             Medication List         albuterol 108 (90 BASE) MCG/ACT inhaler  Commonly known as:  PROVENTIL HFA;VENTOLIN HFA  Inhale 2 puffs into the lungs  every 6 (six) hours as needed for wheezing or shortness of breath.     amLODipine 5 MG tablet  Commonly known as:  NORVASC  Take 1 tablet (5 mg total) by mouth daily.     apixaban 5 MG Tabs tablet  Commonly known as:  ELIQUIS  Take 1 tablet (5 mg total) by mouth 2 (two) times daily.     atenolol 25 MG tablet  Commonly known as:  TENORMIN  Take 1 tablet (25 mg total) by mouth 2 (two) times daily.     cetirizine 10 MG tablet  Commonly known as:  ZYRTEC  Take 10 mg by mouth daily as needed for allergies or rhinitis.     cholecalciferol 1000 UNITS  tablet  Commonly known as:  VITAMIN D  Take 1,000 Units by mouth every morning.     dexamethasone 4 MG tablet  Commonly known as:  DECADRON  4 mg by mouth twice a day the day before, day of and day after the chemotherapy every 3 weeks.     EYE VITAMINS PO  Take 1 capsule by mouth 2 (two) times daily. Due to macular degeneration     folic acid 1 MG tablet  Commonly known as:  FOLVITE  Take 1 tablet (1 mg total) by mouth daily.     glimepiride 1 MG tablet  Commonly known as:  AMARYL  Take 1 tablet (1 mg total) by mouth as directed. Take on days you take decadron.     guaiFENesin 600 MG 12 hr tablet  Commonly known as:  MUCINEX  Take 600 mg by mouth 2 (two) times daily.     ibuprofen 800 MG tablet  Commonly known as:  ADVIL,MOTRIN  Take 800 mg by mouth 2 (two) times daily as needed for moderate pain.     magic mouthwash w/lidocaine Soln  Take 5 mLs by mouth 3 (three) times daily as needed for mouth pain.     methocarbamol 500 MG tablet  Commonly known as:  ROBAXIN  Take 500 mg by mouth every 8 (eight) hours as needed (pain).     Milk Thistle 200 MG Caps  Take 1 capsule by mouth every morning.     prochlorperazine 10 MG tablet  Commonly known as:  COMPAZINE  Take 1 tablet (10 mg total) by mouth every 6 (six) hours as needed for nausea or vomiting.     sucralfate 1 GM/10ML suspension  Commonly known as:  CARAFATE  Take 1 g by mouth 4 (four) times daily as needed (reflux).       Allergies  Allergen Reactions  . Candesartan Cilexetil     REACTION: Muscle spasms  . Diltiazem Hcl     REACTION: Dizziness  . Doxazosin Mesylate Other (See Comments)    REACTION: H/A's  . Nifedipine     REACTION: Leg swelling  . Pravastatin Other (See Comments)    Muscle aches  . Red Yeast Rice     Muscle aches  . Rosuvastatin Other (See Comments)    REACTION: questionable: severe constipation  . Simvastatin Other (See Comments)    REACTION: Muscle aches      The results of  significant diagnostics from this hospitalization (including imaging, microbiology, ancillary and laboratory) are listed below for reference.    Significant Diagnostic Studies: Dg Chest 1 View  08/14/2014   CLINICAL DATA:  Post thoracentesis  EXAM: CHEST - 1 VIEW  COMPARISON:  08/13/2014  FINDINGS: Cardiomediastinal silhouette is stable. Small residual left pleural  effusion with left basilar atelectasis or infiltrate. No pulmonary edema. No pneumothorax. Right lung is clear.  IMPRESSION: Small residual left portal effusion left basilar atelectasis or infiltrate. No pulmonary edema. No pneumothorax.   Electronically Signed   By: Lahoma Crocker M.D.   On: 08/14/2014 11:55   Dg Chest 1 View  07/28/2014   CLINICAL DATA:  Left pleural effusion. Left thoracentesis. Lung cancer.  EXAM: CHEST - 1 VIEW  COMPARISON:  01/25/2014  FINDINGS: There has been marked reduction in the left pleural effusion with only a small residual effusion after thoracentesis. There is no pneumothorax. There are several new small vague areas of density in the right mid lung zone which could represent areas of inflammation or possible metastasis.  IMPRESSION: No pneumothorax after left thoracentesis. Small residual left effusion. Small patchy areas of density in the right midzone could represent metastatic disease.   Electronically Signed   By: Rozetta Nunnery M.D.   On: 07/28/2014 11:57   Dg Chest 1 View  07/25/2014   CLINICAL DATA:  Shortness of breath.  EXAM: CHEST - 1 VIEW  COMPARISON:  07/11/2014.  FINDINGS: The cardiac silhouette, mediastinal and hilar contours are stable. Interval reaccumulation of left pleural effusion which is moderate in size. There is overlying atelectasis. The right lung is clear.  IMPRESSION: Recurrent left pleural effusion with overlying atelectasis.   Electronically Signed   By: Kalman Jewels M.D.   On: 07/25/2014 13:14   Dg Chest 2 View  08/13/2014   CLINICAL DATA:  Shortness of breath  EXAM: CHEST  2 VIEW   COMPARISON:  07/28/2014.  FINDINGS: Normal heart size. Atherosclerotic disease involves the aortic arch. There is a moderate left pleural effusion which is similar in volume to the previous exam. Overlying consolidation and atelectasis is noted. Right lung appears clear.  IMPRESSION: 1. No significant change in volume of left pleural effusion with overlying atelectasis/consolidation.   Electronically Signed   By: Kerby Moors M.D.   On: 08/13/2014 13:38   Ct Angio Chest Pe W/cm &/or Wo Cm  08/13/2014   CLINICAL DATA:  Shortness of breath with productive cough; history of lung malignancy with most recent chemotherapy 3 weeks ago  EXAM: CT ANGIOGRAPHY CHEST WITH CONTRAST  TECHNIQUE: Multidetector CT imaging of the chest was performed using the standard protocol during bolus administration of intravenous contrast. Multiplanar CT image reconstructions and MIPs were obtained to evaluate the vascular anatomy.  CONTRAST:  138mL OMNIPAQUE IOHEXOL 350 MG/ML SOLN  COMPARISON:  PA and lateral chest x-ray of today's date as well as a frontal chest X ray of July 28, 2014 and CT scan of the thorax of June 08, 2014.  FINDINGS: There is a to large left-sided pleural effusion which has increased since August 6. There is a tiny right pleural effusion layering posteriorly.  Contrast within the pulmonary arterial tree is normal. There are no filling defects to suggest an acute pulmonary embolism. The caliber of the thoracic aorta is normal. There is no false lumen. There is a small hiatal hernia. The cardiac chambers are normal in size. There are coronary artery calcifications. There is a stable enlarged right hilar lymph node measuring 14 cm in short axis.  Within the upper abdomen there are abnormal hypodensities in the right hepatic lobe consistent with known metastatic disease. There are no adrenal masses. The observed portions of the bony thorax exhibit no acute abnormalities.  Review of the MIP images confirms the above  findings.  IMPRESSION: 1.  There is no acute pulmonary embolism nor acute thoracic aortic pathology. 2. There is a moderate to large left-sided pleural effusion which is increased since the chest x-ray of July 27, 2014. There is a tiny right pleural effusion. 3. Known hepatic metastatic disease.   Electronically Signed   By: David  Martinique   On: 08/13/2014 16:41   US Thoracentesis Asp Pleural Space W/img Guide  08/14/2014   CLINICAL DATA:  History lung cancer; left pleural effusion; request for thoracentesis  EXAM: ULTRASOUND GUIDED left THORACENTESIS  COMPARISON:  Previous thoracentesis  PROCEDURE: An ultrasound guided thoracentesis was thoroughly discussed with the patient and questions answered. The benefits, risks, alternatives and complications were also discussed. The patient understands and wishes to proceed with the procedure. Written consent was obtained.  Ultrasound was performed to localize and mark an adequate pocket of fluid in the left chest. The area was then prepped and draped in the normal sterile fashion. 1% Lidocaine was used for local anesthesia. Under ultrasound guidance a 19 gauge Yueh catheter was introduced. Thoracentesis was performed. The catheter was removed and a dressing applied.  Complications:  None  FINDINGS: A total of approximately 1.5 L of yellow fluid was removed. A fluid sample was notsent for laboratory analysis.  IMPRESSION: Successful ultrasound guided left thoracentesis yielding 1.5 L of pleural fluid.  Read by:  Lavonia Drafts [facility]   Electronically Signed   By: Daryll Brod M.D.   On: 08/14/2014 11:39   US Thoracentesis Asp Pleural Space W/img Guide  07/28/2014   CLINICAL DATA:  Patient with history of adenocarcinoma of the left lung, dyspnea, recurrent left pleural effusion. Request is made for therapeutic left thoracentesis.  EXAM: ULTRASOUND GUIDED THERAPEUTIC LEFT THORACENTESIS  COMPARISON:  PRIOR THORACENTESIS ON 07/11/2014  PROCEDURE: An ultrasound guided  thoracentesis was thoroughly discussed with the patient and questions answered. The benefits, risks, alternatives and complications were also discussed. The patient understands and wishes to proceed with the procedure. Written consent was obtained.  Ultrasound was performed to localize and mark an adequate pocket of fluid in the left chest. The area was then prepped and draped in the normal sterile fashion. 1% Lidocaine was used for local anesthesia. Under ultrasound guidance a 19 gauge Yueh catheter was introduced. Thoracentesis was performed. The catheter was removed and a dressing applied.  Complications:  None.  FINDINGS: A total of approximately 1.5 liters of yellow fluid was removed.  IMPRESSION: Successful ultrasound guided therapeutic left thoracentesis yielding 1.5 liters of pleural fluid.  Read by: Rowe Robert, PA-C   Electronically Signed   By: Maryclare Bean M.D.   On: 07/28/2014 13:51    Microbiology: No results found for this or any previous visit (from the past 240 hour(s)).   Labs: Basic Metabolic Panel:  Recent Labs Lab 08/10/14 1019 08/13/14 1222 08/13/14 2112 08/14/14 0212  NA 139 138  --  142  K 4.1 4.7  --  4.7  CL  --  99  --  104  CO2 26 25  --  31  GLUCOSE 204* 269*  --  136*  BUN 16.6 15  --  13  CREATININE 1.0 0.81  --  0.78  CALCIUM 8.4 8.9  --  8.7  MG  --   --  1.9  --   PHOS  --   --  3.0  --    Liver Function Tests:  Recent Labs Lab 08/10/14 1019 08/13/14 1222  AST 21 21  ALT 18 15  ALKPHOS 63 68  BILITOT 0.85 1.3*  PROT 5.8* 6.3  ALBUMIN 3.5 3.7   No results found for this basename: LIPASE, AMYLASE,  in the last 168 hours No results found for this basename: AMMONIA,  in the last 168 hours CBC:  Recent Labs Lab 08/10/14 1019 08/13/14 1222 08/14/14 0212  WBC 2.2* 3.3* 3.1*  NEUTROABS 1.4* 2.4  --   HGB 10.5* 11.8* 10.2*  HCT 30.9* 34.5* 31.0*  MCV 99.7* 101.5* 102.6*  PLT 52* 98* 89*   Cardiac Enzymes:  Recent Labs Lab 08/13/14 2112  08/14/14 0212 08/14/14 0824  TROPONINI <0.30 <0.30 <0.30   BNP: BNP (last 3 results)  Recent Labs  02/22/14 1314 08/13/14 1222  PROBNP 61.0 1771.0*   CBG:  Recent Labs Lab 08/13/14 2201 08/14/14 0745 08/14/14 1207  GLUCAP 177* 133* 155*       Signed:  Velvet Bathe  [facility] 08/14/2014, 1:27 PM

## 2014-08-14 NOTE — Progress Notes (Signed)
SATURATION QUALIFICATIONS: (This note is used to comply with regulatory documentation for home oxygen)  Patient Saturations on Room Air at Rest = 90%  Patient Saturations on Room Air while Ambulating = 87-88%  Patient Saturations on 1 Liters of oxygen while Ambulating = 96%  Please briefly explain why patient needs home oxygen: Patient has lung cancer

## 2014-08-14 NOTE — Progress Notes (Signed)
Llana Aliment, RN, BSN Nurse Case Manager Utilization Review Completed.

## 2014-08-14 NOTE — Progress Notes (Signed)
Discharge instructions reviewed with patient and wife utilizing teach back method. Patient discharged to home with home oxygen supply [facility].

## 2014-08-14 NOTE — Progress Notes (Signed)
Patient up to bathroom without assist advised patient to call for assistance when getting out of bed. Patient removes oxygen to go to restroom and oxygen level drops to 88-89% on room air . Patient appears very SOB and wheezy on exertion.  Notified RT for prn breathing treatment  and placed bed alarm on. Will continue to monitor.

## 2014-08-14 NOTE — Progress Notes (Signed)
Patient ambulated 100+ feet  around nursing unit on room air. Oxygen saturation ranged 87-88%. on room air.

## 2014-08-15 ENCOUNTER — Other Ambulatory Visit (HOSPITAL_BASED_OUTPATIENT_CLINIC_OR_DEPARTMENT_OTHER): Payer: Medicare Other

## 2014-08-15 ENCOUNTER — Ambulatory Visit (HOSPITAL_COMMUNITY)
Admission: RE | Admit: 2014-08-15 | Discharge: 2014-08-15 | Disposition: A | Discharge status: Home / Self Care | Payer: Medicare Other | Source: Ambulatory Visit | Attending: Internal Medicine | Admitting: Internal Medicine

## 2014-08-15 ENCOUNTER — Encounter (HOSPITAL_COMMUNITY): Payer: Self-pay

## 2014-08-15 DIAGNOSIS — K769 Liver disease, unspecified: Secondary | ICD-10-CM | POA: Diagnosis not present

## 2014-08-15 DIAGNOSIS — C3490 Malignant neoplasm of unspecified part of unspecified bronchus or lung: Secondary | ICD-10-CM | POA: Insufficient documentation

## 2014-08-15 DIAGNOSIS — C3410 Malignant neoplasm of upper lobe, unspecified bronchus or lung: Secondary | ICD-10-CM

## 2014-08-15 LAB — CBC WITH DIFFERENTIAL/PLATELET
BASO%: 0.4 % (ref 0.0–2.0)
Basophils Absolute: 0 10*3/uL (ref 0.0–0.1)
EOS%: 1 % (ref 0.0–7.0)
Eosinophils Absolute: 0 10*3/uL (ref 0.0–0.5)
HEMATOCRIT: 33.7 % — AB (ref 38.4–49.9)
HGB: 11.2 g/dL — ABNORMAL LOW (ref 13.0–17.1)
LYMPH#: 0.5 10*3/uL — AB (ref 0.9–3.3)
LYMPH%: 17.7 % (ref 14.0–49.0)
MCH: 34.2 pg — AB (ref 27.2–33.4)
MCHC: 33.3 g/dL (ref 32.0–36.0)
MCV: 102.6 fL — ABNORMAL HIGH (ref 79.3–98.0)
MONO#: 0.8 10*3/uL (ref 0.1–0.9)
MONO%: 27.2 % — ABNORMAL HIGH (ref 0.0–14.0)
NEUT#: 1.5 10*3/uL (ref 1.5–6.5)
NEUT%: 53.7 % (ref 39.0–75.0)
Platelets: 143 10*3/uL (ref 140–400)
RBC: 3.28 10*6/uL — AB (ref 4.20–5.82)
RDW: 18 % — AB (ref 11.0–14.6)
WBC: 2.9 10*3/uL — AB (ref 4.0–10.3)

## 2014-08-15 LAB — COMPREHENSIVE METABOLIC PANEL (CC13)
ALBUMIN: 3.4 g/dL — AB (ref 3.5–5.0)
ALT: 11 U/L (ref 0–55)
ANION GAP: 8 meq/L (ref 3–11)
AST: 15 U/L (ref 5–34)
Alkaline Phosphatase: 63 U/L (ref 40–150)
BILIRUBIN TOTAL: 0.82 mg/dL (ref 0.20–1.20)
BUN: 18.4 mg/dL (ref 7.0–26.0)
CALCIUM: 8.9 mg/dL (ref 8.4–10.4)
CHLORIDE: 102 meq/L (ref 98–109)
CO2: 29 meq/L (ref 22–29)
Creatinine: 0.9 mg/dL (ref 0.7–1.3)
GLUCOSE: 184 mg/dL — AB (ref 70–140)
Potassium: 3.9 mEq/L (ref 3.5–5.1)
SODIUM: 139 meq/L (ref 136–145)
TOTAL PROTEIN: 6.1 g/dL — AB (ref 6.4–8.3)

## 2014-08-15 MED ORDER — IOHEXOL 300 MG/ML  SOLN
100.0000 mL | Freq: Once | INTRAMUSCULAR | Status: AC | PRN
  Administered 2014-08-15: 100 mL via INTRAVENOUS

## 2014-08-17 ENCOUNTER — Other Ambulatory Visit: Payer: Self-pay | Admitting: Medical Oncology

## 2014-08-17 ENCOUNTER — Ambulatory Visit: Payer: Medicare Other | Admitting: Internal Medicine

## 2014-08-17 ENCOUNTER — Telehealth: Payer: Self-pay | Admitting: Medical Oncology

## 2014-08-17 ENCOUNTER — Other Ambulatory Visit: Payer: Medicare Other

## 2014-08-17 DIAGNOSIS — C3490 Malignant neoplasm of unspecified part of unspecified bronchus or lung: Secondary | ICD-10-CM

## 2014-08-17 MED ORDER — LEVOFLOXACIN 500 MG PO TABS: 500.0000 mg | ORAL_TABLET | Freq: Every day | ORAL | Status: DC

## 2014-08-17 NOTE — Telephone Encounter (Addendum)
Home from hospital with oxygen and intermittent low grade fever 100.0 with chills . He did not sleep at all. Now his phlegm /sputum is green and thick . He did not go home on an antibiotic

## 2014-08-17 NOTE — Telephone Encounter (Signed)
Shawn Espinoza notified of new rx.

## 2014-08-18 ENCOUNTER — Encounter: Payer: Self-pay | Admitting: Internal Medicine

## 2014-08-18 ENCOUNTER — Telehealth: Payer: Self-pay | Admitting: *Deleted

## 2014-08-18 ENCOUNTER — Telehealth: Payer: Self-pay | Admitting: Internal Medicine

## 2014-08-18 ENCOUNTER — Ambulatory Visit (HOSPITAL_BASED_OUTPATIENT_CLINIC_OR_DEPARTMENT_OTHER): Payer: Medicare Other | Admitting: Internal Medicine

## 2014-08-18 VITALS — BP 116/78 | HR 71 | Temp 97.5°F | Resp 21 | Ht 69.0 in | Wt 182.7 lb

## 2014-08-18 DIAGNOSIS — J90 Pleural effusion, not elsewhere classified: Secondary | ICD-10-CM

## 2014-08-18 DIAGNOSIS — C3490 Malignant neoplasm of unspecified part of unspecified bronchus or lung: Secondary | ICD-10-CM

## 2014-08-18 DIAGNOSIS — I4891 Unspecified atrial fibrillation: Secondary | ICD-10-CM

## 2014-08-18 NOTE — Telephone Encounter (Signed)
Pt confirmed labs/ov per 08/27 POF, sent msg to add chemo and also spk w/Cindy w/ref for pt, gave pt AVS..Marland KitchenKJ

## 2014-08-18 NOTE — Progress Notes (Signed)
[place] Telephone:([phone]) [phone]   Fax:([phone]) [place], [place] [address]  DIAGNOSIS: Stage IIIB/IV non-small cell lung cancer, adenocarcinoma diagnosed in March of 2015.  PRIOR THERAPY: Systemic chemotherapy with carboplatin for AUC of 5 and Alimta 500 mg/M2 every 3 weeks. First dose expected on 04/13/2014. Status post 6 cycles..  CURRENT THERAPY: maintenance chemotherapy with single agent Alimta 500 mg/M2 every 3 weeks. First cycle on 09/06/2014  INTERVAL HISTORY: Shawn Espinoza 73 y.o. male returns to the clinic today for follow up visit accompanied by his wife. The patient was recently admitted to [facility] complaining of shortness of breath and he was found to have an enlarging left pleural effusion. He underwent ultrasound-guided left thoracentesis with drainage of 1.5 L of yellow fluid. He was also found at that time to have atrial fibrillation and was started on treatment with Eliquis by Cardiology. He has occasional dizzy spells. He denied having any significant chest pain but continues to have shortness breath and mild cough with no hemoptysis. He has no significant fever or chills, no nausea or vomiting. He tolerated the last cycle of her systemic chemotherapy with carboplatin and Alimta fairly well except for mild fatigue. He has repeat CT scan of the chest, abdomen and pelvis performed recently and he is here for evaluation and discussion of his scan results.   MEDICAL HISTORY: Past Medical History  Diagnosis Date  . History of diabetes mellitus, type II     resolved with diet, h/o neuropathy  . Diverticulosis of colon   . Hyperlipidemia   . Hypertension   . Fatty liver 02/29/00    abd ultrasound  . Lower back pain   . Systolic murmur 1275    2Decho - normal LV fxn, EF 55%, mild AS, biatrial enlargement  . History of tobacco use quit 1990s  . 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 Personal history of colonic adenomas 07/06/2013  . Hypertensive retinopathy of both eyes 2015    mild  . Non-small cell carcinoma of left lung 02/2014    stage IIIb/IV on chemo    ALLERGIES:  is allergic to candesartan cilexetil; diltiazem hcl; doxazosin mesylate; nifedipine; pravastatin; red yeast rice; rosuvastatin; and simvastatin.  MEDICATIONS:  Current Outpatient Prescriptions  Medication Sig Dispense Refill  . albuterol (PROVENTIL HFA;VENTOLIN HFA) 108 (90 BASE) MCG/ACT inhaler Inhale 2 puffs into the lungs every 6 (six) hours as needed for wheezing or shortness of breath.  1 Inhaler  2  . Alum & Mag Hydroxide-Simeth (MAGIC MOUTHWASH W/LIDOCAINE) SOLN Take 5 mLs by mouth 3 (three) times daily as needed for mouth pain.  240 mL  0  . amLODipine (NORVASC) 5 MG tablet Take 1 tablet (5 mg total) by mouth daily.  90 tablet  2  . apixaban (ELIQUIS) 5 MG TABS tablet Take 1 tablet (5 mg total) by mouth 2 (two) times daily.  60 tablet  0  . atenolol (TENORMIN) 25 MG tablet Take 1 tablet (25 mg total) by mouth 2 (two) times daily.  180 tablet  2  . cetirizine (ZYRTEC) 10 MG tablet Take 10 mg by mouth daily as needed for allergies or rhinitis.       . cholecalciferol (VITAMIN D) 1000 UNITS tablet Take 1,000 Units  by mouth every morning.       Marland Kitchen dexamethasone (DECADRON) 4 MG tablet 4 mg by mouth twice a day the day before, day of and day after the chemotherapy every 3 weeks.  40 tablet  1  . folic acid (FOLVITE) 1 MG tablet Take 1 tablet (1 mg total) by mouth daily.  30 tablet  4  . glimepiride (AMARYL) 1 MG tablet Take 1 tablet (1 mg total) by mouth as directed. Take on days you take decadron.  30 tablet  0  . guaiFENesin (MUCINEX) 600 MG 12 hr tablet Take 600 mg by mouth 2 (two) times daily.       Marland Kitchen ibuprofen (ADVIL,MOTRIN) 800 MG tablet Take 800 mg by mouth 2 (two)  times daily as needed for moderate pain.      Marland Kitchen levofloxacin (LEVAQUIN) 500 MG tablet Take 1 tablet (500 mg total) by mouth daily.  7 tablet  0  . methocarbamol (ROBAXIN) 500 MG tablet Take 500 mg by mouth every 8 (eight) hours as needed (pain).       . Milk Thistle 200 MG CAPS Take 1 capsule by mouth every morning.       . Multiple Vitamins-Minerals (EYE VITAMINS PO) Take 1 capsule by mouth 2 (two) times daily. Due to macular degeneration      . prochlorperazine (COMPAZINE) 10 MG tablet Take 1 tablet (10 mg total) by mouth every 6 (six) hours as needed for nausea or vomiting.  60 tablet  0  . sucralfate (CARAFATE) 1 GM/10ML suspension Take 1 g by mouth 4 (four) times daily as needed (reflux).       No current facility-administered medications for this visit.    SURGICAL HISTORY:  Past Surgical History  Procedure Laterality Date  . Colonoscopy  2004  . Colonoscopy  2014    tubular adenoma x1, mod diverticulosis ([place])  . Flexible bronchoscopy  02/2014    WNL  . Video bronchoscopy Bilateral 03/17/2014    Procedure: VIDEO BRONCHOSCOPY WITH FLUORO;  Surgeon: Tanda Rockers, MD;  Location: [facility];  Service: Cardiopulmonary;  Laterality: Bilateral;    REVIEW OF SYSTEMS:  Constitutional: positive for fatigue Eyes: negative Ears, nose, mouth, throat, and face: negative Respiratory: positive for cough and dyspnea on exertion Cardiovascular: negative Gastrointestinal: negative Genitourinary:negative Integument/breast: negative Hematologic/lymphatic: negative Musculoskeletal:negative Neurological: negative Behavioral/Psych: negative Endocrine: negative Allergic/Immunologic: negative   PHYSICAL EXAMINATION: General appearance: alert, cooperative, fatigued and no distress Head: Normocephalic, without obvious abnormality, atraumatic Neck: no adenopathy, no JVD, supple, symmetrical, trachea midline and thyroid not enlarged, symmetric, no tenderness/mass/nodules Lymph nodes:  Cervical, supraclavicular, and axillary nodes normal. Resp: diminished breath sounds LLL and dullness to percussion LLL Back: symmetric, no curvature. ROM normal. No CVA tenderness. Cardio: regular rate and rhythm, S1, S2 normal, no murmur, click, rub or gallop GI: soft, non-tender; bowel sounds normal; no masses,  no organomegaly Extremities: extremities normal, atraumatic, no cyanosis or edema Neurologic: Alert and oriented X 3, normal strength and tone. Normal symmetric reflexes. Normal coordination and gait  ECOG PERFORMANCE STATUS: 1 - Symptomatic but completely ambulatory  Blood pressure 116/78, pulse 71, temperature 97.5 F (36.4 C), temperature source Oral, resp. rate 21, height 5\' 9"  (1.753 m), weight 182 lb 11.2 oz (82.872 kg), SpO2 98.00%.  LABORATORY DATA: Lab Results  Component Value Date   WBC 2.9* 08/15/2014   HGB 11.2* 08/15/2014   HCT 33.7* 08/15/2014   MCV 102.6* 08/15/2014   PLT 143 08/15/2014  Chemistry      Component Value Date/Time   NA 139 08/15/2014 1049   NA 142 08/14/2014 0212   K 3.9 08/15/2014 1049   K 4.7 08/14/2014 0212   CL 104 08/14/2014 0212   CO2 29 08/15/2014 1049   CO2 31 08/14/2014 0212   BUN 18.4 08/15/2014 1049   BUN 13 08/14/2014 0212   CREATININE 0.9 08/15/2014 1049   CREATININE 0.78 08/14/2014 0212   CREATININE 0.83 02/22/2014 1314      Component Value Date/Time   CALCIUM 8.9 08/15/2014 1049   CALCIUM 8.7 08/14/2014 0212   ALKPHOS 63 08/15/2014 1049   ALKPHOS 68 08/13/2014 1222   AST 15 08/15/2014 1049   AST 21 08/13/2014 1222   ALT 11 08/15/2014 1049   ALT 15 08/13/2014 1222   BILITOT 0.82 08/15/2014 1049   BILITOT 1.3* 08/13/2014 1222       RADIOGRAPHIC STUDIES: Dg Chest 1 View  08/14/2014   CLINICAL DATA:  Post thoracentesis  EXAM: CHEST - 1 VIEW  COMPARISON:  08/13/2014  FINDINGS: Cardiomediastinal silhouette is stable. Small residual left pleural effusion with left basilar atelectasis or infiltrate. No pulmonary edema. No pneumothorax.  Right lung is clear.  IMPRESSION: Small residual left portal effusion left basilar atelectasis or infiltrate. No pulmonary edema. No pneumothorax.   Electronically Signed   By: Lahoma Crocker M.D.   On: 08/14/2014 11:55   Dg Chest 1 View  07/28/2014   CLINICAL DATA:  Left pleural effusion. Left thoracentesis. Lung cancer.  EXAM: CHEST - 1 VIEW  COMPARISON:  01/25/2014  FINDINGS: There has been marked reduction in the left pleural effusion with only a small residual effusion after thoracentesis. There is no pneumothorax. There are several new small vague areas of density in the right mid lung zone which could represent areas of inflammation or possible metastasis.  IMPRESSION: No pneumothorax after left thoracentesis. Small residual left effusion. Small patchy areas of density in the right midzone could represent metastatic disease.   Electronically Signed   By: Rozetta Nunnery M.D.   On: 07/28/2014 11:57   Dg Chest 1 View  07/25/2014   CLINICAL DATA:  Shortness of breath.  EXAM: CHEST - 1 VIEW  COMPARISON:  07/11/2014.  FINDINGS: The cardiac silhouette, mediastinal and hilar contours are stable. Interval reaccumulation of left pleural effusion which is moderate in size. There is overlying atelectasis. The right lung is clear.  IMPRESSION: Recurrent left pleural effusion with overlying atelectasis.   Electronically Signed   By: Kalman Jewels M.D.   On: 07/25/2014 13:14   Dg Chest 2 View  08/13/2014   CLINICAL DATA:  Shortness of breath  EXAM: CHEST  2 VIEW  COMPARISON:  07/28/2014.  FINDINGS: Normal heart size. Atherosclerotic disease involves the aortic arch. There is a moderate left pleural effusion which is similar in volume to the previous exam. Overlying consolidation and atelectasis is noted. Right lung appears clear.  IMPRESSION: 1. No significant change in volume of left pleural effusion with overlying atelectasis/consolidation.   Electronically Signed   By: Kerby Moors M.D.   On: 08/13/2014 13:38   Ct  Angio Chest Pe W/cm &/or Wo Cm  08/13/2014   CLINICAL DATA:  Shortness of breath with productive cough; history of lung malignancy with most recent chemotherapy 3 weeks ago  EXAM: CT ANGIOGRAPHY CHEST WITH CONTRAST  TECHNIQUE: Multidetector CT imaging of the chest was performed using the standard protocol during bolus administration of intravenous contrast. Multiplanar CT image reconstructions  and MIPs were obtained to evaluate the vascular anatomy.  CONTRAST:  166mL OMNIPAQUE IOHEXOL 350 MG/ML SOLN  COMPARISON:  PA and lateral chest x-ray of today's date as well as a frontal chest X ray of July 28, 2014 and CT scan of the thorax of June 08, 2014.  FINDINGS: There is a to large left-sided pleural effusion which has increased since August 6. There is a tiny right pleural effusion layering posteriorly.  Contrast within the pulmonary arterial tree is normal. There are no filling defects to suggest an acute pulmonary embolism. The caliber of the thoracic aorta is normal. There is no false lumen. There is a small hiatal hernia. The cardiac chambers are normal in size. There are coronary artery calcifications. There is a stable enlarged right hilar lymph node measuring 14 cm in short axis.  Within the upper abdomen there are abnormal hypodensities in the right hepatic lobe consistent with known metastatic disease. There are no adrenal masses. The observed portions of the bony thorax exhibit no acute abnormalities.  Review of the MIP images confirms the above findings.  IMPRESSION: 1. There is no acute pulmonary embolism nor acute thoracic aortic pathology. 2. There is a moderate to large left-sided pleural effusion which is increased since the chest x-ray of July 27, 2014. There is a tiny right pleural effusion. 3. Known hepatic metastatic disease.   Electronically Signed   By: David  Martinique   On: 08/13/2014 16:41   Ct Abdomen Pelvis W Contrast  08/15/2014   CLINICAL DATA:  Followup metastatic non-small cell lung  carcinoma. Left pleural effusion. Undergoing chemotherapy.  EXAM: CT ABDOMEN AND PELVIS WITH CONTRAST  TECHNIQUE: Multidetector CT imaging of the abdomen and pelvis was performed using the standard protocol following bolus administration of intravenous contrast.  CONTRAST:  140mL OMNIPAQUE IOHEXOL 300 MG/ML  SOLN  COMPARISON:  06/08/2014 and more recent chest CT on 08/13/2014  FINDINGS: Liver: Heterogeneous mass in the anterior segment of the right hepatic lobe currently measures 4.2 x 4.9 cm on image 2 of series 5, compared to 4.2 x 4.8 cm previously. No new or enlarging liver masses are identified.  Gallbladder/Biliary:  Unremarkable.  Pancreas: No mass, inflammatory changes, or other parenchymal abnormality identified.  Spleen:  Within normal limits in size and appearance.  Adrenal Glands:  No mass identified.  Kidneys/Urinary Tract: No masses identified. No evidence of hydronephrosis.  Lymph Nodes: Small lymph nodes in the porta hepatis remains stable, with largest lymph node in the portacaval space measuring 14 mm on image 26 of series 2. No new or enlarging lymphadenopathy identified within the abdomen or pelvis.  Bowel: Mild diverticulosis again demonstrated, however there is no evidence of diverticulitis.  Pelvic/Reproductive Organs: Mildly enlarged prostate gland is stable.  Vascular:  No evidence of abdominal aortic aneurysm.  Musculoskeletal:  No suspicious bone lesions identified.  Other: Moderate left pleural effusion shows decrease in size compared to prior exam. Mild patchy airspace opacity in the medial right lower lobe shows no significant change.  IMPRESSION: Stable right hepatic lobe mass. No new or progressive disease identified within the abdomen or pelvis.  Stable shotty upper abdominal lymph nodes in the porta hepatis. Interval decrease in size of left pleural effusion.   Electronically Signed   By: Earle Gell M.D.   On: 08/15/2014 13:59   US Thoracentesis Asp Pleural Space W/img  Guide  08/14/2014   CLINICAL DATA:  History lung cancer; left pleural effusion; request for thoracentesis  EXAM: ULTRASOUND GUIDED left  THORACENTESIS  COMPARISON:  Previous thoracentesis  PROCEDURE: An ultrasound guided thoracentesis was thoroughly discussed with the patient and questions answered. The benefits, risks, alternatives and complications were also discussed. The patient understands and wishes to proceed with the procedure. Written consent was obtained.  Ultrasound was performed to localize and mark an adequate pocket of fluid in the left chest. The area was then prepped and draped in the normal sterile fashion. 1% Lidocaine was used for local anesthesia. Under ultrasound guidance a 19 gauge Yueh catheter was introduced. Thoracentesis was performed. The catheter was removed and a dressing applied.  Complications:  None  FINDINGS: A total of approximately 1.5 L of yellow fluid was removed. A fluid sample was notsent for laboratory analysis.  IMPRESSION: Successful ultrasound guided left thoracentesis yielding 1.5 L of pleural fluid.  Read by:  Lavonia Drafts [facility]   Electronically Signed   By: Daryll Brod M.D.   On: 08/14/2014 11:39   US Thoracentesis Asp Pleural Space W/img Guide  07/28/2014   CLINICAL DATA:  Patient with history of adenocarcinoma of the left lung, dyspnea, recurrent left pleural effusion. Request is made for therapeutic left thoracentesis.  EXAM: ULTRASOUND GUIDED THERAPEUTIC LEFT THORACENTESIS  COMPARISON:  PRIOR THORACENTESIS ON 07/11/2014  PROCEDURE: An ultrasound guided thoracentesis was thoroughly discussed with the patient and questions answered. The benefits, risks, alternatives and complications were also discussed. The patient understands and wishes to proceed with the procedure. Written consent was obtained.  Ultrasound was performed to localize and mark an adequate pocket of fluid in the left chest. The area was then prepped and draped in the normal sterile fashion. 1%  Lidocaine was used for local anesthesia. Under ultrasound guidance a 19 gauge Yueh catheter was introduced. Thoracentesis was performed. The catheter was removed and a dressing applied.  Complications:  None.  FINDINGS: A total of approximately 1.5 liters of yellow fluid was removed.  IMPRESSION: Successful ultrasound guided therapeutic left thoracentesis yielding 1.5 liters of pleural fluid.  Read by: Rowe Robert, PA-C   Electronically Signed   By: Maryclare Bean M.D.   On: 07/28/2014 13:51   ASSESSMENT AND PLAN: this is a very pleasant 73 years old white male with:  1) Stage IV non-small cell lung cancer, adenocarcinoma presenting with large right upper lobe lung mass in addition to mediastinal and bilateral hilar lymphadenopathy as well as cervical lymphadenopathy and left pleural effusion. The patient completed a course systemic chemotherapy with carboplatin and Alimta status post 6 cycles. He is tolerating his treatment fairly well except for fatigue secondary to chemotherapy-induced anemia. His recent scan showed no significant evidence for disease progression. I discussed the scan results with the patient and his wife. I given him the option of maintenance chemotherapy with single agent Alimta versus observation and close monitoring. After discussion of the 2 options indicates, the patient would like to consider the maintenance chemotherapy. I will arrange for the first cycle of this treatment to start in 2 weeks to give him more time to recover from the recent hospitalization.  2) Recurrent left pleural effusion: I discussed with the patient referral to cardiothoracic surgery for consideration of Pleurx catheter placement. He agreed to this option. He is expected to see Dr. Prescott Gum on 08/24/2014.  3) Atrial fibrillation: continue Aliquis.  The patient would come back for followup visit in 2 weeks for evaluation and management any adverse effect of his treatment.  He was advised to call  immediately if he has any concerning symptoms  in the interval.  The patient voices understanding of current disease status and treatment options and is in agreement with the current care plan.  All questions were answered. The patient knows to call the clinic with any problems, questions or concerns. We can certainly see the patient much sooner if necessary.  Disclaimer: This note was dictated with voice recognition software. Similar sounding words can inadvertently be transcribed and may not be corrected upon review.

## 2014-08-18 NOTE — Telephone Encounter (Signed)
Per staff message and POF I have scheduled appts. Advised scheduler of appts. JMW  

## 2014-08-23 ENCOUNTER — Telehealth: Payer: Self-pay | Admitting: Medical Oncology

## 2014-08-23 NOTE — Telephone Encounter (Signed)
I told his wife that she needs to contact pt PCP about monitering eloquis and edema. She understands.

## 2014-08-24 ENCOUNTER — Institutional Professional Consult (permissible substitution) (INDEPENDENT_AMBULATORY_CARE_PROVIDER_SITE_OTHER): Payer: Medicare Other | Admitting: Cardiothoracic Surgery

## 2014-08-24 ENCOUNTER — Other Ambulatory Visit: Payer: Self-pay

## 2014-08-24 ENCOUNTER — Encounter: Payer: Self-pay | Admitting: Cardiothoracic Surgery

## 2014-08-24 VITALS — BP 133/82 | HR 79 | Ht 69.0 in | Wt 182.0 lb

## 2014-08-24 DIAGNOSIS — J90 Pleural effusion, not elsewhere classified: Secondary | ICD-10-CM

## 2014-08-24 DIAGNOSIS — J910 Malignant pleural effusion: Secondary | ICD-10-CM

## 2014-08-25 ENCOUNTER — Encounter (HOSPITAL_COMMUNITY): Payer: Self-pay | Admitting: *Deleted

## 2014-08-25 MED ORDER — DEXTROSE 5 % IV SOLN
1.5000 g | INTRAVENOUS | Status: AC
  Administered 2014-08-26: 1.5 g via INTRAVENOUS
  Filled 2014-08-25: qty 1.5

## 2014-08-25 NOTE — Progress Notes (Signed)
PCP is [name], MD Referring Provider is Curt Bears, MD  Chief Complaint  Patient presents with  . NEW THORACIC    PLEURX CATHER PLACEMENT    HPI: The patient is a 73 year old male with history of smoking who is been treated by Dr. 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 Recently he has developed a significant left pleural effusion which is reaching and and a very quickly. It is been symptomatic significant shortness of breath. Cytologies on the fluid removed were not checked but it is believed to be malignant. The patient is still functional works part-time at [employer] range at [employer] the patient has history of hepatitis C. EF is normal by recent echocardiogram. The patient is on chronic adequate relation for atrial fibrillation-Elliquis. No bleeding complications.   Past Medical History  Diagnosis Date  . History of diabetes mellitus, type II     resolved with diet, h/o neuropathy  . Diverticulosis of colon   . Hyperlipidemia   . Hypertension   . Fatty liver 02/29/00    abd ultrasound  . Lower back pain   . Systolic murmur 7253    2Decho - normal LV fxn, EF 55%, mild AS, biatrial enlargement  . History of tobacco use quit 1990s  . 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 Personal history of colonic adenomas 07/06/2013  . Hypertensive retinopathy of both eyes 2015    mild  . 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 Dysrhythmia     Atrial fib (found 07/2014)  . Shortness of breath   . Pneumonia   . GERD (gastroesophageal reflux disease)     occasional  . Arthritis   . Neuropathy   . Constipation     Past Surgical History  Procedure Laterality Date  . Colonoscopy  2004  . Colonoscopy  2014    tubular adenoma x1, mod  diverticulosis (Gessner)  . Flexible bronchoscopy  02/2014    WNL  . Video bronchoscopy Bilateral 03/17/2014    Procedure: VIDEO BRONCHOSCOPY WITH FLUORO;  Surgeon: Tanda Rockers, MD;  Location: [facility];  Service: Cardiopulmonary;  Laterality: Bilateral;    Family History  Problem Relation Age of Onset  . Stroke Mother     multiple mini strokes  . Hypertension Mother   . Heart disease Maternal Grandfather     MI  . Stroke Paternal Grandmother   . Colon cancer Neg Hx     Social History History  Substance Use Topics  . Smoking status: Former Smoker -- 3.00 packs/day for 30 years    Types: Cigarettes    Quit date: 12/23/1988  . Smokeless tobacco: Never Used  . Alcohol Use: 12.6 oz/week    21 Glasses of wine per week     Comment: 3-4 wine a day ( 8/03- 2 beers, 2 wines, 2 brandies daily)    Current Outpatient Prescriptions  Medication Sig Dispense Refill  . amLODipine (NORVASC) 5 MG tablet Take 1 tablet (5 mg total) by mouth daily.  90 tablet  2  . apixaban (ELIQUIS) 5 MG TABS tablet Take 1 tablet (5 mg total) by mouth 2 (two) times daily.  60 tablet  0  . atenolol (TENORMIN) 25 MG tablet Take 1 tablet (  25 mg total) by mouth 2 (two) times daily.  180 tablet  2  . cetirizine (ZYRTEC) 10 MG tablet Take 10 mg by mouth daily as needed for allergies or rhinitis.       . cholecalciferol (VITAMIN D) 1000 UNITS tablet Take 1,000 Units by mouth every morning.       Marland Kitchen dexamethasone (DECADRON) 4 MG tablet 4 mg by mouth twice a day the day before, day of and day after the chemotherapy every 3 weeks.  40 tablet  1  . folic acid (FOLVITE) 1 MG tablet Take 1 tablet (1 mg total) by mouth daily.  30 tablet  4  . glimepiride (AMARYL) 1 MG tablet Take 1 tablet (1 mg total) by mouth as directed. Take on days you take decadron.  30 tablet  0  . methocarbamol (ROBAXIN) 500 MG tablet Take 500 mg by mouth every 8 (eight) hours as needed (pain).       . Multiple Vitamins-Minerals (EYE VITAMINS PO)  Take 1 capsule by mouth 2 (two) times daily. Due to macular degeneration      . sucralfate (CARAFATE) 1 GM/10ML suspension Take 1 g by mouth 4 (four) times daily as needed (reflux).      Marland Kitchen albuterol (PROVENTIL HFA;VENTOLIN HFA) 108 (90 BASE) MCG/ACT inhaler Inhale 2 puffs into the lungs every 6 (six) hours as needed for wheezing or shortness of breath.  1 Inhaler  2  . Alum & Mag Hydroxide-Simeth (MAGIC MOUTHWASH W/LIDOCAINE) SOLN Take 5 mLs by mouth 3 (three) times daily as needed for mouth pain.  240 mL  0  . furosemide (LASIX) 20 MG tablet Take 20 mg by mouth.      Marland Kitchen ibuprofen (ADVIL,MOTRIN) 800 MG tablet Take 800 mg by mouth 2 (two) times daily as needed for moderate pain.      Marland Kitchen prochlorperazine (COMPAZINE) 10 MG tablet Take 1 tablet (10 mg total) by mouth every 6 (six) hours as needed for nausea or vomiting.  60 tablet  0   No current facility-administered medications for this visit.   Facility-Administered Medications Ordered in Other Visits  Medication Dose Route Frequency Provider Last Rate Last Dose  . cefUROXime (ZINACEF) 1.5 g in dextrose 5 % 50 mL IVPB  1.5 g Intravenous 60 min Pre-Op Ivin Poot, MD        Allergies  Allergen Reactions  . Candesartan Cilexetil     REACTION: Muscle spasms  . Diltiazem Hcl     REACTION: Dizziness  . Doxazosin Mesylate Other (See Comments)    REACTION: H/A's  . Nifedipine     REACTION: Leg swelling  . Pravastatin Other (See Comments)    Muscle aches  . Red Yeast Rice     Muscle aches  . Rosuvastatin Other (See Comments)    REACTION: questionable: severe constipation  . Simvastatin Other (See Comments)    REACTION: Muscle aches    Review of Systems Progressive loss of weight and strength with chemotherapy over the past few months  BP 133/82  Pulse 79  Ht 5\' 9"  (1.753 m)  Wt 182 lb (82.555 kg)  BMI 26.86 kg/m2  SpO2 97% Physical Exam Elderly male with COPD no acute distress accompanied by wife HEENT normocephalic Breath  sounds reduced on left Neck without palpable adenopathy Heart rhythm regular no murmur Mild pedal and ankle edema-Lasix 20 mg started daily Neuro nonfocal  Diagnostic Tests: Chest x-rays and most recent CT scan chest reviewed. Large left oral effusion which was  treated with thoracentesis but with rapid reaccumulation.  Impression: Pleurx catheter to drain left recurrent pleural effusion is appropriate. Patient will stop his oral anticoagulant and we will schedule his left Pleurx catheter for September 4. At [facility] hospital outpatient.  Plan: Proceed with Pleurx catheter on September 4. Procedure indications benefits risks discussed with patient and wife.

## 2014-08-25 NOTE — Progress Notes (Signed)
08/25/14 1248  OBSTRUCTIVE SLEEP APNEA  Have you ever been diagnosed with sleep apnea through a sleep study? No  Do you snore loudly (loud enough to be heard through closed doors)?  0  Do you often feel tired, fatigued, or sleepy during the daytime? 1  Has anyone observed you stop breathing during your sleep? 1  Do you have, or are you being treated for high blood pressure? 1  BMI more than 35 kg/m2? 0  Age over 74 years old? 1  Gender: 1  Obstructive Sleep Apnea Score 5  Score 4 or greater  Results sent to PCP

## 2014-08-25 NOTE — Progress Notes (Signed)
Pt's wife verified allergies, meds, medical and surgical history for pre-op call. She was given pre-op instructions.

## 2014-08-26 ENCOUNTER — Encounter (HOSPITAL_COMMUNITY)
Admission: RE | Disposition: A | Discharge status: Home / Self Care | Payer: Self-pay | Source: Ambulatory Visit | Attending: Cardiothoracic Surgery

## 2014-08-26 ENCOUNTER — Ambulatory Visit (HOSPITAL_COMMUNITY): Payer: Medicare Other

## 2014-08-26 ENCOUNTER — Encounter (HOSPITAL_COMMUNITY): Payer: Self-pay | Admitting: Surgery

## 2014-08-26 ENCOUNTER — Ambulatory Visit (HOSPITAL_COMMUNITY): Payer: Medicare Other | Admitting: Anesthesiology

## 2014-08-26 ENCOUNTER — Ambulatory Visit (HOSPITAL_COMMUNITY)
Admission: RE | Admit: 2014-08-26 | Discharge: 2014-08-26 | Disposition: A | Discharge status: Home / Self Care | Payer: Medicare Other | Source: Ambulatory Visit | Attending: Cardiothoracic Surgery | Admitting: Cardiothoracic Surgery

## 2014-08-26 ENCOUNTER — Encounter (HOSPITAL_COMMUNITY): Payer: Medicare Other | Admitting: Anesthesiology

## 2014-08-26 DIAGNOSIS — C3490 Malignant neoplasm of unspecified part of unspecified bronchus or lung: Secondary | ICD-10-CM | POA: Diagnosis not present

## 2014-08-26 DIAGNOSIS — Z87891 Personal history of nicotine dependence: Secondary | ICD-10-CM | POA: Diagnosis not present

## 2014-08-26 DIAGNOSIS — D649 Anemia, unspecified: Secondary | ICD-10-CM | POA: Diagnosis not present

## 2014-08-26 DIAGNOSIS — J90 Pleural effusion, not elsewhere classified: Secondary | ICD-10-CM | POA: Insufficient documentation

## 2014-08-26 DIAGNOSIS — K219 Gastro-esophageal reflux disease without esophagitis: Secondary | ICD-10-CM | POA: Diagnosis not present

## 2014-08-26 DIAGNOSIS — I10 Essential (primary) hypertension: Secondary | ICD-10-CM | POA: Diagnosis not present

## 2014-08-26 HISTORY — DX: Pneumonia, unspecified organism: J18.9

## 2014-08-26 HISTORY — DX: Gastro-esophageal reflux disease without esophagitis: K21.9

## 2014-08-26 HISTORY — DX: Constipation, unspecified: K59.00

## 2014-08-26 HISTORY — DX: Shortness of breath: R06.02

## 2014-08-26 HISTORY — DX: Cardiac arrhythmia, unspecified: I49.9

## 2014-08-26 HISTORY — PX: CHEST TUBE INSERTION: SHX231

## 2014-08-26 HISTORY — DX: Polyneuropathy, unspecified: G62.9

## 2014-08-26 HISTORY — DX: Unspecified osteoarthritis, unspecified site: M19.90

## 2014-08-26 LAB — CBC
HCT: 38.3 % — ABNORMAL LOW (ref 39.0–52.0)
Hemoglobin: 12.6 g/dL — ABNORMAL LOW (ref 13.0–17.0)
MCH: 32.1 pg (ref 26.0–34.0)
MCHC: 32.9 g/dL (ref 30.0–36.0)
MCV: 97.7 fL (ref 78.0–100.0)
Platelets: 176 10*3/uL (ref 150–400)
RBC: 3.92 MIL/uL — ABNORMAL LOW (ref 4.22–5.81)
RDW: 15.6 % — ABNORMAL HIGH (ref 11.5–15.5)
WBC: 4.7 10*3/uL (ref 4.0–10.5)

## 2014-08-26 LAB — BASIC METABOLIC PANEL
Anion gap: 11 (ref 5–15)
BUN: 16 mg/dL (ref 6–23)
CO2: 29 mEq/L (ref 19–32)
Calcium: 8.7 mg/dL (ref 8.4–10.5)
Chloride: 100 mEq/L (ref 96–112)
Creatinine, Ser: 0.76 mg/dL (ref 0.50–1.35)
GFR calc Af Amer: >90 mL/min (ref >90)
GFR calc non Af Amer: 88 mL/min — ABNORMAL LOW (ref >90)
Glucose, Bld: 130 mg/dL — ABNORMAL HIGH (ref 70–99)
Potassium: 4.5 mEq/L (ref 3.7–5.3)
Sodium: 140 mEq/L (ref 137–147)

## 2014-08-26 LAB — GLUCOSE, CAPILLARY
Glucose-Capillary: 124 mg/dL — ABNORMAL HIGH (ref 70–99)
Glucose-Capillary: 115 mg/dL — ABNORMAL HIGH (ref 70–99)
Glucose-Capillary: 112 mg/dL — ABNORMAL HIGH (ref 70–99)

## 2014-08-26 LAB — PROTIME-INR
INR: 1.16 (ref 0.00–1.49)
Prothrombin Time: 14.8 seconds (ref 11.6–15.2)

## 2014-08-26 LAB — APTT: aPTT: 26 seconds (ref 24–37)

## 2014-08-26 SURGERY — INSERTION, PLEURAL DRAINAGE CATHETER
Anesthesia: Monitor Anesthesia Care | Site: Chest | Laterality: Left

## 2014-08-26 MED ORDER — LIDOCAINE HCL 1 % IJ SOLN
INTRAMUSCULAR | Status: DC | PRN
  Administered 2014-08-26: 5 mL

## 2014-08-26 MED ORDER — MIDAZOLAM HCL 2 MG/2ML IJ SOLN
INTRAMUSCULAR | Status: AC
  Filled 2014-08-26: qty 2

## 2014-08-26 MED ORDER — PROPOFOL 10 MG/ML IV BOLUS
INTRAVENOUS | Status: AC
  Filled 2014-08-26: qty 20

## 2014-08-26 MED ORDER — MEPERIDINE HCL 25 MG/ML IJ SOLN: 6.2500 mg | INTRAMUSCULAR | Status: DC | PRN

## 2014-08-26 MED ORDER — OXYCODONE HCL 5 MG/5ML PO SOLN: 5.0000 mg | Freq: Once | ORAL | Status: DC | PRN

## 2014-08-26 MED ORDER — MIDAZOLAM HCL 5 MG/5ML IJ SOLN
INTRAMUSCULAR | Status: DC | PRN
  Administered 2014-08-26: 1 mg via INTRAVENOUS

## 2014-08-26 MED ORDER — LACTATED RINGERS IV SOLN
INTRAVENOUS | Status: DC
  Administered 2014-08-26: 13:00:00 via INTRAVENOUS

## 2014-08-26 MED ORDER — PROMETHAZINE HCL 25 MG/ML IJ SOLN: 6.2500 mg | INTRAMUSCULAR | Status: DC | PRN

## 2014-08-26 MED ORDER — LACTATED RINGERS IV SOLN
INTRAVENOUS | Status: DC | PRN
  Administered 2014-08-26: 15:00:00 via INTRAVENOUS

## 2014-08-26 MED ORDER — PROPOFOL 10 MG/ML IV BOLUS
INTRAVENOUS | Status: DC | PRN
  Administered 2014-08-26: 20 mg via INTRAVENOUS

## 2014-08-26 MED ORDER — OXYCODONE HCL 5 MG PO TABS: 5.0000 mg | ORAL_TABLET | Freq: Once | ORAL | Status: DC | PRN

## 2014-08-26 MED ORDER — FENTANYL CITRATE 0.05 MG/ML IJ SOLN: 25.0000 ug | INTRAMUSCULAR | Status: DC | PRN

## 2014-08-26 MED ORDER — FENTANYL CITRATE 0.05 MG/ML IJ SOLN
INTRAMUSCULAR | Status: AC
  Filled 2014-08-26: qty 5

## 2014-08-26 MED ORDER — FENTANYL CITRATE 0.05 MG/ML IJ SOLN
INTRAMUSCULAR | Status: DC | PRN
  Administered 2014-08-26 (×4): 50 ug via INTRAVENOUS

## 2014-08-26 SURGICAL SUPPLY — 34 items
ADH SKN CLS APL DERMABOND .7 (GAUZE/BANDAGES/DRESSINGS) ×1
CANISTER SUCTION 2500CC (MISCELLANEOUS) ×3 IMPLANT
COVER SURGICAL LIGHT HANDLE (MISCELLANEOUS) ×3 IMPLANT
DERMABOND ADVANCED (GAUZE/BANDAGES/DRESSINGS) ×2
DERMABOND ADVANCED .7 DNX12 (GAUZE/BANDAGES/DRESSINGS) ×1 IMPLANT
DRAPE C-ARM 42X72 X-RAY (DRAPES) ×3 IMPLANT
DRAPE LAPAROSCOPIC ABDOMINAL (DRAPES) ×3 IMPLANT
DRSG TEGADERM 4X4.75 (GAUZE/BANDAGES/DRESSINGS) ×2 IMPLANT
GLOVE BIO SURGEON STRL SZ 6 (GLOVE) ×2 IMPLANT
GLOVE BIOGEL PI IND STRL 6 (GLOVE) IMPLANT
GLOVE BIOGEL PI IND STRL 6.5 (GLOVE) IMPLANT
GLOVE BIOGEL PI INDICATOR 6 (GLOVE) ×2
GLOVE BIOGEL PI INDICATOR 6.5 (GLOVE) ×2
GLOVE ORTHO TXT STRL SZ7.5 (GLOVE) ×6 IMPLANT
GOWN STRL REUS W/ TWL LRG LVL3 (GOWN DISPOSABLE) ×2 IMPLANT
GOWN STRL REUS W/TWL LRG LVL3 (GOWN DISPOSABLE) ×6
KIT BASIN OR (CUSTOM PROCEDURE TRAY) ×3 IMPLANT
KIT PLEURX DRAIN CATH 1000ML (MISCELLANEOUS) ×3 IMPLANT
KIT PLEURX DRAIN CATH 15.5FR (DRAIN) ×3 IMPLANT
KIT ROOM TURNOVER OR (KITS) ×3 IMPLANT
NDL HYPO 25GX1X1/2 BEV (NEEDLE) ×1 IMPLANT
NEEDLE HYPO 25GX1X1/2 BEV (NEEDLE) ×3 IMPLANT
NS IRRIG 1000ML POUR BTL (IV SOLUTION) ×3 IMPLANT
PACK GENERAL/GYN (CUSTOM PROCEDURE TRAY) ×3 IMPLANT
PAD ARMBOARD 7.5X6 YLW CONV (MISCELLANEOUS) ×6 IMPLANT
SET DRAINAGE LINE (MISCELLANEOUS) IMPLANT
SPONGE GAUZE 4X4 12PLY STER LF (GAUZE/BANDAGES/DRESSINGS) ×2 IMPLANT
SUT ETHILON 3 0 PS 1 (SUTURE) ×3 IMPLANT
SUT SILK 2 0 SH (SUTURE) ×3 IMPLANT
SUT VIC AB 3-0 SH 18 (SUTURE) ×2 IMPLANT
SYR CONTROL 10ML LL (SYRINGE) ×3 IMPLANT
TOWEL OR 17X24 6PK STRL BLUE (TOWEL DISPOSABLE) ×3 IMPLANT
TOWEL OR 17X26 10 PK STRL BLUE (TOWEL DISPOSABLE) ×3 IMPLANT
WATER STERILE IRR 1000ML POUR (IV SOLUTION) ×3 IMPLANT

## 2014-08-26 NOTE — Discharge Instructions (Signed)
Keep dressing clean, dry and intact until follow up with Dr. Prescott Gum.     General Anesthesia, Care After Refer to this sheet in the next few weeks. These instructions provide you with information on caring for yourself after your procedure. Your health care provider may also give you more specific instructions. Your treatment has been planned according to current medical practices, but problems sometimes occur. Call your health care provider if you have any problems or questions after your procedure. WHAT TO EXPECT AFTER THE PROCEDURE After the procedure, it is typical to experience:  Sleepiness.  Nausea and vomiting. HOME CARE INSTRUCTIONS  For the first 24 hours after general anesthesia:  Have a responsible person with you.  Do not drive a car. If you are alone, do not take public transportation.  Do not drink alcohol.  Do not take medicine that has not been prescribed by your health care provider.  Do not sign important papers or make important decisions.  You may resume a normal diet and activities as directed by your health care provider.  Change bandages (dressings) as directed.  If you have questions or problems that seem related to general anesthesia, call the hospital and ask for the anesthetist or anesthesiologist on call. SEEK MEDICAL CARE IF:  You have nausea and vomiting that continue the day after anesthesia.  You develop a rash. SEEK IMMEDIATE MEDICAL CARE IF:   You have difficulty breathing.  You have chest pain.  You have any allergic problems. Document Released: 03/17/2001 Document Revised: 12/14/2013 Document Reviewed: 06/24/2013 [facility] Patient Information 2015 [place], [place]. This information is not intended to replace advice given to you by your health care provider. Make sure you discuss any questions you have with your health care provider.

## 2014-08-26 NOTE — Anesthesia Preprocedure Evaluation (Addendum)
Anesthesia Evaluation  Patient identified by MRN, date of birth, ID band Patient awake    Reviewed: Allergy & Precautions, H&P , NPO status , Patient's Chart, lab work & pertinent test results  History of Anesthesia Complications Negative for: history of anesthetic complications  Airway Mallampati: II TM Distance: >3 FB Neck ROM: Full    Dental  (+) Teeth Intact, Dental Advisory Given   Pulmonary shortness of breath, former smoker,    + decreased breath sounds      Cardiovascular hypertension, Pt. on medications Rhythm:Regular     Neuro/Psych negative neurological ROS     GI/Hepatic Neg liver ROS, GERD-  Controlled,  Endo/Other    Renal/GU      Musculoskeletal   Abdominal   Peds  Hematology  (+) anemia ,   Anesthesia Other Findings   Reproductive/Obstetrics                      Anesthesia Physical Anesthesia Plan  ASA: III  Anesthesia Plan: MAC and General   Post-op Pain Management:    Induction: Intravenous  Airway Management Planned: Mask and Oral ETT  Additional Equipment:   Intra-op Plan:   Post-operative Plan: Extubation in OR  Informed Consent: I have reviewed the patients History and Physical, chart, labs and discussed the procedure including the risks, benefits and alternatives for the proposed anesthesia with the patient or authorized representative who has indicated his/her understanding and acceptance.   Dental advisory given  Plan Discussed with: CRNA, Anesthesiologist and Surgeon  Anesthesia Plan Comments:      Anesthesia Quick Evaluation

## 2014-08-26 NOTE — Progress Notes (Signed)
Family/ patient updated on delay

## 2014-08-26 NOTE — Brief Op Note (Signed)
08/26/2014  7:14 PM  PATIENT:  Shawn Espinoza  73 y.o. male  PRE-OPERATIVE DIAGNOSIS:  Left pleural effusion  POST-OPERATIVE DIAGNOSIS:  Left pleural effusion  PROCEDURE:  Procedure(s): INSERTION OF LEFT  PLEURAL DRAINAGE CATHETER (Left) drainage olf L pleural effusion 1.0 Liter  SURGEON:  Surgeon(s) and Role:    * Ivin Poot, MD - Primary  PHYSICIAN ASSISTANT:   ASSISTANTS: none   ANESTHESIA:   IV sedation  EBL:   0  BLOOD ADMINISTERED:none  DRAINS: none   LOCAL MEDICATIONS USED:  XYLOCAINE  8 ml  SPECIMEN:  Aspirate  DISPOSITION OF SPECIMEN:  PATHOLOGY  COUNTS:  YES  TOURNIQUET:  * No tourniquets in log *  DICTATION: .Dragon Dictation  PLAN OF CARE: Discharge to home after PACU  PATIENT DISPOSITION:  Short Stay   Delay start of Pharmacological VTE agent (>24hrs) due to surgical blood loss or risk of bleeding: yes

## 2014-08-26 NOTE — Anesthesia Postprocedure Evaluation (Signed)
  Anesthesia Post-op Note  Patient: Shawn Espinoza  Procedure(s) Performed: Procedure(s): INSERTION OF LEFT  PLEURAL DRAINAGE CATHETER (Left)  Patient Location: PACU  Anesthesia Type:MAC  Level of Consciousness: awake and alert   Airway and Oxygen Therapy: Patient Spontanous Breathing  Post-op Pain: none  Post-op Assessment: Post-op Vital signs reviewed, Patient's Cardiovascular Status Stable, Respiratory Function Stable, Patent Airway, No signs of Nausea or vomiting and Pain level controlled  Post-op Vital Signs: Reviewed and stable  Last Vitals:  Filed Vitals:   08/26/14 2000  BP: 137/70  Pulse: 68  Temp:   Resp: 22    Complications: No apparent anesthesia complications

## 2014-08-26 NOTE — Transfer of Care (Signed)
Immediate Anesthesia Transfer of Care Note  Patient: Shawn Espinoza  Procedure(s) Performed: Procedure(s): INSERTION OF LEFT  PLEURAL DRAINAGE CATHETER (Left)  Patient Location: PACU  Anesthesia Type:MAC  Level of Consciousness: awake, alert  and oriented  Airway & Oxygen Therapy: Patient Spontanous Breathing  Post-op Assessment: Report given to PACU RN and Post -op Vital signs reviewed and stable  Post vital signs: Reviewed and stable  Complications: No apparent anesthesia complications

## 2014-08-26 NOTE — Progress Notes (Signed)
The patient was examined and preop studies reviewed. There has been no change from the prior exam and the patient is ready for surgery.  Plan left Pleurx catheter placement on [place] troday

## 2014-08-27 NOTE — Op Note (Signed)
NAME:  Shawn Espinoza, Shawn Espinoza NO.:  [id number]  MEDICAL RECORD NO.:  [id number]  LOCATION:  [facility]                         FACILITY:  [facility]  PHYSICIAN:  Ivin Poot, M.D.  DATE OF BIRTH:  [birth year]  DATE OF PROCEDURE:  08/26/2014 DATE OF DISCHARGE:  08/26/2014                              OPERATIVE REPORT   OPERATION:  Placement of left PleurX catheter.  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  SURGEON:  Ivin Poot, M.D.  ANESTHESIA:  IV conscious monitored sedation by anesthesia and local 1% lidocaine.  OPERATIVE PROCEDURE:  The patient was brought to the operating room after informed consent was obtained for placement of a PleurX catheter for recurrent large left pleural effusion, probable malignant, associated with his advanced stage lung cancer.  The patient was placed supine on the operating room table with the arms tucked.  The left chest and upper abdomen were prepped and draped as a sterile field.  A proper time-out was performed.  Local 1% lidocaine was infiltrated in the anterior axillary line in the fifth interspace.  The pleural space was entered and aspirated of clear fluid.  A small incision was made at this point.  A needle with a sheath was then placed into the pleural space and a guidewire passed to the dependent portion of the left pleural space, confirmed position by C-arm fluoroscopy.  Local anesthesia was then infiltrated to the costal margin in the midclavicular line.  A small incision was made.  The catheter was then tunneled from the lower incision to the upper incision.  Over the guidewire, a dilator - sheath was then passed and the sheath was used to insert the catheter into the pleural space.  The tearaway sheath  was removed.  The catheter was connected to the vacuum bottle and drained 1 L.  C-arm fluoroscopy documented the catheter was in good position.  The two incisions were closed and the catheter was secured to the skin.  Sterile dressings were applied.  The patient was returned to the recovery room where the chest x-ray showed the pleural effusion had been drained and the catheter was in good position without pneumothorax.     Ivin Poot, M.D.     PV/MEDQ  D:  08/27/2014  T:  08/27/2014  Job:  [id number]  cc:   Eilleen Kempf, M.D.

## 2014-08-30 ENCOUNTER — Ambulatory Visit (INDEPENDENT_AMBULATORY_CARE_PROVIDER_SITE_OTHER): Payer: Self-pay | Admitting: Cardiothoracic Surgery

## 2014-08-30 ENCOUNTER — Encounter: Payer: Self-pay | Admitting: Cardiothoracic Surgery

## 2014-08-30 ENCOUNTER — Ambulatory Visit: Payer: Medicare Other | Admitting: Family Medicine

## 2014-08-30 VITALS — BP 127/79 | HR 70 | Resp 20 | Ht 69.0 in | Wt 182.0 lb

## 2014-08-30 DIAGNOSIS — Z09 Encounter for follow-up examination after completed treatment for conditions other than malignant neoplasm: Secondary | ICD-10-CM

## 2014-08-30 DIAGNOSIS — J910 Malignant pleural effusion: Secondary | ICD-10-CM

## 2014-08-30 LAB — GLUCOSE, CAPILLARY: Glucose-Capillary: 135 mg/dL — ABNORMAL HIGH (ref 70–99)

## 2014-08-30 NOTE — Progress Notes (Signed)
PCP is [name], MD Referring Provider is Curt Bears, MD  Chief Complaint  Patient presents with  . Routine Post Op    F/U after insertion of Left PleurX Catheter on 08/26/14, drainage amount today was 600cc's    HPI: Office visit for review of recently placed left Pleurx catheter for advanced stage lung cancer. The patient called in over the weekend and stated there is some drainage around the catheter site. The home health nurses not yet visited the patient. There's been no fever or significant pain.  On exam today the catheter site is clean and dry. The soft foam dressing did have some drainage around it probably from the time of surgery. The Pleurx catheter drained approximately 450 cc of clear fluid today. Will ask home health to drain the catheter on Thursday and then resume a Monday Wednesday Friday schedule the following week. Review of the cytology from the fluid removed at surgery is still pending.  Past Medical History  Diagnosis Date  . History of diabetes mellitus, type II     resolved with diet, h/o neuropathy  . Diverticulosis of colon   . Hyperlipidemia   . Hypertension   . Fatty liver 02/29/00    abd ultrasound  . Lower back pain   . Systolic murmur 4967    2Decho - normal LV fxn, EF 55%, mild AS, biatrial enlargement  . History of tobacco use quit 1990s  . 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 Personal history of colonic adenomas 07/06/2013  . Hypertensive retinopathy of both eyes 2015    mild  . 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 Dysrhythmia     Atrial fib (found 07/2014)  . Shortness of breath   . Pneumonia   . GERD (gastroesophageal reflux disease)     occasional  . Arthritis   . Neuropathy   . Constipation     Past Surgical History  Procedure Laterality Date  . Colonoscopy  2004  .  Colonoscopy  2014    tubular adenoma x1, mod diverticulosis (Gessner)  . Flexible bronchoscopy  02/2014    WNL  . Video bronchoscopy Bilateral 03/17/2014    Procedure: VIDEO BRONCHOSCOPY WITH FLUORO;  Surgeon: Tanda Rockers, MD;  Location: [facility];  Service: Cardiopulmonary;  Laterality: Bilateral;  . Chest tube insertion Left 08/26/2014    Procedure: INSERTION OF LEFT  PLEURAL DRAINAGE CATHETER;  Surgeon: Ivin Poot, MD;  [facility] OR;  Service: Thoracic;  Laterality: Left;    Family History  Problem Relation Age of Onset  . Stroke Mother     multiple mini strokes  . Hypertension Mother   . Heart disease Maternal Grandfather     MI  . Stroke Paternal Grandmother   . Colon cancer Neg Hx     Social History History  Substance Use Topics  . Smoking status: Former Smoker -- 3.00 packs/day for 30 years    Types: Cigarettes    Quit date: 12/23/1988  . Smokeless tobacco: Never Used  . Alcohol Use: 12.6 oz/week    21 Glasses of wine per week     Comment: 3-4 wine a day ( 8/03- 2 beers, 2 wines, 2 brandies daily)    Current Outpatient Prescriptions  Medication Sig Dispense Refill  . albuterol (PROVENTIL HFA;VENTOLIN HFA) 108 (  90 BASE) MCG/ACT inhaler Inhale 2 puffs into the lungs every 6 (six) hours as needed for wheezing or shortness of breath.  1 Inhaler  2  . Alum & Mag Hydroxide-Simeth (MAGIC MOUTHWASH W/LIDOCAINE) SOLN Take 5 mLs by mouth 3 (three) times daily as needed for mouth pain.  240 mL  0  . amLODipine (NORVASC) 5 MG tablet Take 1 tablet (5 mg total) by mouth daily.  90 tablet  2  . atenolol (TENORMIN) 25 MG tablet Take 1 tablet (25 mg total) by mouth 2 (two) times daily.  180 tablet  2  . cetirizine (ZYRTEC) 10 MG tablet Take 10 mg by mouth daily as needed for allergies or rhinitis.       . cholecalciferol (VITAMIN D) 1000 UNITS tablet Take 1,000 Units by mouth every morning.       Marland Kitchen dexamethasone (DECADRON) 4 MG tablet 4 mg by mouth twice a day the day before,  day of and day after the chemotherapy every 3 weeks.  40 tablet  1  . folic acid (FOLVITE) 1 MG tablet Take 1 tablet (1 mg total) by mouth daily.  30 tablet  4  . furosemide (LASIX) 20 MG tablet Take 20 mg by mouth.      Marland Kitchen glimepiride (AMARYL) 1 MG tablet Take 1 tablet (1 mg total) by mouth as directed. Take on days you take decadron.  30 tablet  0  . HYDROcodone-acetaminophen (NORCO/VICODIN) 5-325 MG per tablet Take 1 tablet by mouth every 6 (six) hours as needed for moderate pain.      Marland Kitchen ibuprofen (ADVIL,MOTRIN) 800 MG tablet Take 800 mg by mouth 2 (two) times daily as needed for moderate pain.      . methocarbamol (ROBAXIN) 500 MG tablet Take 500 mg by mouth every 8 (eight) hours as needed (pain).       . Multiple Vitamins-Minerals (EYE VITAMINS PO) Take 1 capsule by mouth 2 (two) times daily. Due to macular degeneration      . prochlorperazine (COMPAZINE) 10 MG tablet Take 1 tablet (10 mg total) by mouth every 6 (six) hours as needed for nausea or vomiting.  60 tablet  0  . sucralfate (CARAFATE) 1 GM/10ML suspension Take 1 g by mouth 4 (four) times daily as needed (reflux).      Marland Kitchen apixaban (ELIQUIS) 5 MG TABS tablet Take 1 tablet (5 mg total) by mouth 2 (two) times daily.  60 tablet  0   No current facility-administered medications for this visit.    Allergies  Allergen Reactions  . Candesartan Cilexetil     REACTION: Muscle spasms  . Diltiazem Hcl     REACTION: Dizziness  . Doxazosin Mesylate Other (See Comments)    REACTION: H/A's  . Nifedipine     REACTION: Leg swelling  . Pravastatin Other (See Comments)    Muscle aches  . Red Yeast Rice     Muscle aches  . Rosuvastatin Other (See Comments)    REACTION: questionable: severe constipation  . Simvastatin Other (See Comments)    REACTION: Muscle aches    Review of Systems doing well after Pleurx catheter placement. Complaining of some lower remedy and peroneal edema. He is taking Lasix 20 mg daily.  BP 127/79  Pulse 70   Resp 20  Ht 5\' 9"  (1.753 m)  Wt 182 lb (82.555 kg)  BMI 26.86 kg/m2  SpO2 98% Physical Exam Alert and comfortable breathing comfortably Breath sounds clear Pleurx catheter incisions clean dry New clean  dressing placed around catheter  Diagnostic Tests: None  Impression: Doing well after Pleurx catheter placement for malignant left effusion  Plan: Return office in 3 days for review and probable removal of sutures.

## 2014-08-31 ENCOUNTER — Ambulatory Visit: Payer: Medicare Other | Admitting: Physician Assistant

## 2014-09-02 ENCOUNTER — Ambulatory Visit (INDEPENDENT_AMBULATORY_CARE_PROVIDER_SITE_OTHER): Payer: Medicare Other

## 2014-09-02 ENCOUNTER — Ambulatory Visit
Admission: RE | Admit: 2014-09-02 | Discharge: 2014-09-02 | Disposition: A | Discharge status: Home / Self Care | Payer: Medicare Other | Source: Ambulatory Visit | Attending: Cardiothoracic Surgery | Admitting: Cardiothoracic Surgery

## 2014-09-02 ENCOUNTER — Ambulatory Visit: Payer: Medicare Other

## 2014-09-02 DIAGNOSIS — J90 Pleural effusion, not elsewhere classified: Secondary | ICD-10-CM

## 2014-09-02 NOTE — Treatment Plan (Signed)
Pt was received to have his sutures removed today.Pt's skin is healing well .Mild to almost no pain. No redness Mild soreness.Pt tolerated removal well.

## 2014-09-05 ENCOUNTER — Other Ambulatory Visit: Payer: Self-pay | Admitting: Internal Medicine

## 2014-09-05 ENCOUNTER — Ambulatory Visit (INDEPENDENT_AMBULATORY_CARE_PROVIDER_SITE_OTHER): Payer: Medicare Other | Admitting: Family Medicine

## 2014-09-05 ENCOUNTER — Encounter: Payer: Self-pay | Admitting: Family Medicine

## 2014-09-05 VITALS — BP 132/82 | HR 68 | Temp 97.9°F | Wt 185.8 lb

## 2014-09-05 DIAGNOSIS — R21 Rash and other nonspecific skin eruption: Secondary | ICD-10-CM

## 2014-09-05 DIAGNOSIS — I10 Essential (primary) hypertension: Secondary | ICD-10-CM

## 2014-09-05 DIAGNOSIS — C787 Secondary malignant neoplasm of liver and intrahepatic bile duct: Secondary | ICD-10-CM

## 2014-09-05 DIAGNOSIS — H6122 Impacted cerumen, left ear: Secondary | ICD-10-CM

## 2014-09-05 DIAGNOSIS — J90 Pleural effusion, not elsewhere classified: Secondary | ICD-10-CM

## 2014-09-05 DIAGNOSIS — C3490 Malignant neoplasm of unspecified part of unspecified bronchus or lung: Secondary | ICD-10-CM

## 2014-09-05 DIAGNOSIS — H6120 Impacted cerumen, unspecified ear: Secondary | ICD-10-CM | POA: Insufficient documentation

## 2014-09-05 DIAGNOSIS — I4891 Unspecified atrial fibrillation: Secondary | ICD-10-CM

## 2014-09-05 DIAGNOSIS — C3492 Malignant neoplasm of unspecified part of left bronchus or lung: Secondary | ICD-10-CM

## 2014-09-05 MED ORDER — FUROSEMIDE 20 MG PO TABS: 20.0000 mg | ORAL_TABLET | Freq: Every day | ORAL | Status: DC

## 2014-09-05 MED ORDER — GLIMEPIRIDE 1 MG PO TABS: 1.0000 mg | ORAL_TABLET | ORAL | Status: DC

## 2014-09-05 MED ORDER — APIXABAN 5 MG PO TABS: 5.0000 mg | ORAL_TABLET | Freq: Two times a day (BID) | ORAL | Status: DC

## 2014-09-05 NOTE — Assessment & Plan Note (Signed)
PleurX placed 08/2014 by [facility]. Appreciate their and [facility] care of patient.

## 2014-09-05 NOTE — Assessment & Plan Note (Signed)
Chronic, stable. Continue regimen. 

## 2014-09-05 NOTE — Assessment & Plan Note (Signed)
Attempted cerumen removal with plastic curette unsuccessfully, so then irrigation performed successfully.

## 2014-09-05 NOTE — Patient Instructions (Signed)
Compression stocking prescription provided today. Continue lasix, eliquis (refilled today) Good to see you today, call us with questions. Return to see me at previously scheduled appointment.

## 2014-09-05 NOTE — Progress Notes (Signed)
Pre visit review using our clinic review tool, if applicable. No additional management support is needed unless otherwise documented below in the visit note. 

## 2014-09-05 NOTE — Assessment & Plan Note (Signed)
Today sounds regular. Will regardless continue elliquis for now. Continue atenolol and lasix.

## 2014-09-05 NOTE — Progress Notes (Signed)
BP 132/82  Pulse 68  Temp(Src) 97.9 F (36.6 C) (Oral)  Wt 185 lb 12 oz (84.256 kg)   CC: hosp f/u visit  Subjective:    Patient ID: Shawn Espinoza, male    DOB: 1941/02/11, 73 y.o.   MRN: [id number]  HPI: Shawn Espinoza is a 73 y.o. 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 Has completed regular chemo, planning maintenance chemo therapy.  L pleurX cath placed 08/26/2014 for recurrent malignant pleural effusions ([place]).  [place] nurse is established - draining 3x/wk with ~667mL each time. Notes some pedal edema ongoing for last several months.   Recent hospitalization - at [facility] with dyspnea thought due to new onset atrial fibrillation so he was started on elliquis. Echo showed EF 55-60%, troponins were negative. Latest CBC stable.  Lab Results  Component Value Date   WBC 4.7 08/26/2014   HGB 12.6* 08/26/2014   HCT 38.3* 08/26/2014   MCV 97.7 08/26/2014   PLT 176 08/26/2014    Provided with O2 at hospital discharge 2/2 desaturation with exertion to 88%, using 1L by [place] at night time.   D/C summary reviewed. Pt states he was discharged with 5 d course of levaquin, but that is not mentioned in D/C Summary.  Also with L ear pain over last few days. Also with itchy R posterior thigh rash present over last few days.  Continues miralax and senna prn. Denies constipation. Appetite good. Normal voiding. Wt Readings from Last 3 Encounters:  09/05/14 185 lb 12 oz (84.256 kg)  08/30/14 182 lb (82.555 kg)  08/26/14 182 lb (82.555 kg)   Body mass index is 27.42 kg/(m^2).  Relevant past medical, surgical, family and social history reviewed and updated as indicated.  Allergies and medications reviewed and updated. Current Outpatient Prescriptions on File Prior to Visit  Medication Sig  . albuterol (PROVENTIL HFA;VENTOLIN HFA) 108 (90 BASE) MCG/ACT inhaler Inhale 2 puffs into the lungs every 6 (six)  hours as needed for wheezing or shortness of breath.  . Alum & Mag Hydroxide-Simeth (MAGIC MOUTHWASH W/LIDOCAINE) SOLN Take 5 mLs by mouth 3 (three) times daily as needed for mouth pain.  Marland Kitchen amLODipine (NORVASC) 5 MG tablet Take 1 tablet (5 mg total) by mouth daily.  Marland Kitchen atenolol (TENORMIN) 25 MG tablet Take 1 tablet (25 mg total) by mouth 2 (two) times daily.  . cetirizine (ZYRTEC) 10 MG tablet Take 10 mg by mouth daily as needed for allergies or rhinitis.   . cholecalciferol (VITAMIN D) 1000 UNITS tablet Take 1,000 Units by mouth every morning.   Marland Kitchen dexamethasone (DECADRON) 4 MG tablet 4 mg by mouth twice a day the day before, day of and day after the chemotherapy every 3 weeks.  Marland Kitchen HYDROcodone-acetaminophen (NORCO/VICODIN) 5-325 MG per tablet Take 1 tablet by mouth every 6 (six) hours as needed for moderate pain.  Marland Kitchen ibuprofen (ADVIL,MOTRIN) 800 MG tablet Take 800 mg by mouth 2 (two) times daily as needed for moderate pain.  . methocarbamol (ROBAXIN) 500 MG tablet Take 500 mg by mouth every 8 (eight) hours as needed (pain).   . Multiple Vitamins-Minerals (EYE VITAMINS PO) Take 1 capsule by mouth 2 (two) times daily. Due to macular degeneration  . prochlorperazine (COMPAZINE) 10 MG tablet Take 1 tablet (10 mg total) by mouth every 6 (six) hours as needed for nausea or vomiting.  . sucralfate (CARAFATE) 1 GM/10ML suspension Take 1  g by mouth 4 (four) times daily as needed (reflux).   No current facility-administered medications on file prior to visit.    Review of Systems Per HPI unless specifically indicated above    Objective:    BP 132/82  Pulse 68  Temp(Src) 97.9 F (36.6 C) (Oral)  Wt 185 lb 12 oz (84.256 kg)  Physical Exam  Nursing note and vitals reviewed. Constitutional: He appears well-developed and well-nourished. No distress.  HENT:  Right Ear: Hearing, tympanic membrane, external ear and ear canal normal.  Left Ear: Hearing, tympanic membrane and external ear normal.    Mouth/Throat: Oropharynx is clear and moist. No oropharyngeal exudate.  L canal with hard firm wax attached to posterior canal, unable to successfully be removed with plastic curette so irrigation performed  Eyes: Conjunctivae and EOM are normal. Pupils are equal, round, and reactive to light. No scleral icterus.  Neck: Normal range of motion. Neck supple.  Cardiovascular: Normal rate, regular rhythm, normal heart sounds and intact distal pulses.   No murmur heard. Pulmonary/Chest: Effort normal and breath sounds normal. No respiratory distress. He has no wheezes. He has no rales.  Surprisingly clear bilateral lungs  Musculoskeletal: He exhibits edema (1-2+ pedal edema).  Skin: Skin is warm and dry. Rash noted. There is erythema.  R posterior thigh with pruritic erythematous maculopapular rash        Assessment & Plan:   Problem List Items Addressed This Visit   Skin rash     ?heat rash vs chemo drug rash. Suggested he check with onc as well. Treat with moisturizing cream for now.    Recurrent left pleural effusion - Primary     PleurX placed 08/2014 by [facility]. Appreciate their and [facility] care of patient.    New onset atrial fibrillation     Today sounds regular. Will regardless continue elliquis for now. Continue atenolol and lasix.    Relevant Medications      furosemide (LASIX) tablet      apixaban (ELIQUIS) tablet   Liver lesion   HYPERTENSION     Chronic, stable. Continue regimen.     Relevant Medications      furosemide (LASIX) tablet      apixaban (ELIQUIS) tablet   Cerumen debris on tympanic membrane     Attempted cerumen removal with plastic curette unsuccessfully, so then irrigation performed successfully.    Adenocarcinoma of lung     Has completed chemo cycle, now undergoing 3 treatments of maintenance chemo        Follow up plan: Return for previously scheduled appointment.

## 2014-09-05 NOTE — Assessment & Plan Note (Signed)
?  heat rash vs chemo drug rash. Suggested he check with onc as well. Treat with moisturizing cream for now.

## 2014-09-05 NOTE — Assessment & Plan Note (Signed)
Has completed chemo cycle, now undergoing 3 treatments of maintenance chemo

## 2014-09-06 ENCOUNTER — Telehealth: Payer: Self-pay | Admitting: Family Medicine

## 2014-09-06 ENCOUNTER — Other Ambulatory Visit (HOSPITAL_BASED_OUTPATIENT_CLINIC_OR_DEPARTMENT_OTHER): Payer: Medicare Other

## 2014-09-06 ENCOUNTER — Telehealth: Payer: Self-pay | Admitting: Nurse Practitioner

## 2014-09-06 ENCOUNTER — Ambulatory Visit (HOSPITAL_BASED_OUTPATIENT_CLINIC_OR_DEPARTMENT_OTHER): Payer: Medicare Other

## 2014-09-06 ENCOUNTER — Ambulatory Visit (HOSPITAL_BASED_OUTPATIENT_CLINIC_OR_DEPARTMENT_OTHER): Payer: Medicare Other | Admitting: Nurse Practitioner

## 2014-09-06 VITALS — BP 124/75 | HR 67 | Temp 97.5°F | Resp 18 | Ht 69.0 in | Wt 186.2 lb

## 2014-09-06 DIAGNOSIS — I4891 Unspecified atrial fibrillation: Secondary | ICD-10-CM

## 2014-09-06 DIAGNOSIS — C3410 Malignant neoplasm of upper lobe, unspecified bronchus or lung: Secondary | ICD-10-CM

## 2014-09-06 DIAGNOSIS — C3490 Malignant neoplasm of unspecified part of unspecified bronchus or lung: Secondary | ICD-10-CM

## 2014-09-06 DIAGNOSIS — J90 Pleural effusion, not elsewhere classified: Secondary | ICD-10-CM

## 2014-09-06 DIAGNOSIS — C3492 Malignant neoplasm of unspecified part of left bronchus or lung: Secondary | ICD-10-CM

## 2014-09-06 DIAGNOSIS — Z5111 Encounter for antineoplastic chemotherapy: Secondary | ICD-10-CM

## 2014-09-06 DIAGNOSIS — Z23 Encounter for immunization: Secondary | ICD-10-CM

## 2014-09-06 LAB — COMPREHENSIVE METABOLIC PANEL (CC13)
ALBUMIN: 3.5 g/dL (ref 3.5–5.0)
ALT: 12 U/L (ref 0–55)
ANION GAP: 10 meq/L (ref 3–11)
AST: 14 U/L (ref 5–34)
Alkaline Phosphatase: 71 U/L (ref 40–150)
BUN: 20.9 mg/dL (ref 7.0–26.0)
CALCIUM: 9 mg/dL (ref 8.4–10.4)
CHLORIDE: 104 meq/L (ref 98–109)
CO2: 26 meq/L (ref 22–29)
Creatinine: 0.8 mg/dL (ref 0.7–1.3)
Glucose: 211 mg/dl — ABNORMAL HIGH (ref 70–140)
POTASSIUM: 4.7 meq/L (ref 3.5–5.1)
Sodium: 139 mEq/L (ref 136–145)
Total Bilirubin: 0.49 mg/dL (ref 0.20–1.20)
Total Protein: 6.6 g/dL (ref 6.4–8.3)

## 2014-09-06 LAB — CBC WITH DIFFERENTIAL/PLATELET
BASO%: 0.7 % (ref 0.0–2.0)
Basophils Absolute: 0.1 10e3/uL (ref 0.0–0.1)
EOS%: 0 % (ref 0.0–7.0)
Eosinophils Absolute: 0 10e3/uL (ref 0.0–0.5)
HCT: 40.3 % (ref 38.4–49.9)
HGB: 13 g/dL (ref 13.0–17.1)
LYMPH%: 4.2 % — ABNORMAL LOW (ref 14.0–49.0)
MCH: 31.5 pg (ref 27.2–33.4)
MCHC: 32.4 g/dL (ref 32.0–36.0)
MCV: 97.4 fL (ref 79.3–98.0)
MONO#: 0.3 10e3/uL (ref 0.1–0.9)
MONO%: 2.8 % (ref 0.0–14.0)
NEUT#: 10.8 10e3/uL — ABNORMAL HIGH (ref 1.5–6.5)
NEUT%: 92.3 % — ABNORMAL HIGH (ref 39.0–75.0)
Platelets: 175 10e3/uL (ref 140–400)
RBC: 4.14 10e6/uL — ABNORMAL LOW (ref 4.20–5.82)
RDW: 16 % — ABNORMAL HIGH (ref 11.0–14.6)
WBC: 11.7 10e3/uL — ABNORMAL HIGH (ref 4.0–10.3)
lymph#: 0.5 10e3/uL — ABNORMAL LOW (ref 0.9–3.3)

## 2014-09-06 MED ORDER — INFLUENZA VAC SPLIT QUAD 0.5 ML IM SUSY
0.5000 mL | PREFILLED_SYRINGE | Freq: Once | INTRAMUSCULAR | Status: AC
  Administered 2014-09-06: 0.5 mL via INTRAMUSCULAR
  Filled 2014-09-06: qty 0.5

## 2014-09-06 MED ORDER — DEXAMETHASONE SODIUM PHOSPHATE 10 MG/ML IJ SOLN
INTRAMUSCULAR | Status: AC
  Filled 2014-09-06: qty 1

## 2014-09-06 MED ORDER — DEXAMETHASONE SODIUM PHOSPHATE 10 MG/ML IJ SOLN
10.0000 mg | Freq: Once | INTRAMUSCULAR | Status: AC
  Administered 2014-09-06: 10 mg via INTRAVENOUS

## 2014-09-06 MED ORDER — ONDANSETRON 8 MG/NS 50 ML IVPB
INTRAVENOUS | Status: AC
  Filled 2014-09-06: qty 8

## 2014-09-06 MED ORDER — ONDANSETRON 8 MG/50ML IVPB (CHCC)
8.0000 mg | Freq: Once | INTRAVENOUS | Status: AC
  Administered 2014-09-06: 8 mg via INTRAVENOUS

## 2014-09-06 MED ORDER — SODIUM CHLORIDE 0.9 % IV SOLN
500.0000 mg/m2 | Freq: Once | INTRAVENOUS | Status: AC
  Administered 2014-09-06: 1000 mg via INTRAVENOUS
  Filled 2014-09-06: qty 40

## 2014-09-06 MED ORDER — SODIUM CHLORIDE 0.9 % IV SOLN
Freq: Once | INTRAVENOUS | Status: AC
  Administered 2014-09-06: 16:00:00 via INTRAVENOUS

## 2014-09-06 NOTE — Progress Notes (Addendum)
  [place] OFFICE PROGRESS NOTE   Diagnosis: Stage IIIB/IV non-small cell lung cancer   INTERVAL HISTORY:   [name] returns as scheduled. He underwent placement of a left Pleurx catheter by Dr. Nils Pyle on 08/26/2014. The catheter is being drained every 2 days. He estimates 700 cc of fluid with each drainage. He denies shortness of breath. No fever. Cough is better. He is utilizing supplement oxygen at nighttime. He denies nausea/vomiting. No mouth sores. He continues to have intermittent constipation. He takes laxatives as needed. He reports a good appetite. No bleeding.  Objective:  Vital signs in last 24 hours:  Blood pressure 124/75, pulse 67, temperature 97.5 F (36.4 C), temperature source Oral, resp. rate 18, height 5\' 9"  (1.753 m), weight 186 lb 3.2 oz (84.46 kg).    HEENT: No thrush or ulcers. Resp: Lungs clear bilaterally. Left lateral chest Pleurx catheter. Cardio: Regular rate and rhythm. GI: Abdomen soft and nontender. No hepatomegaly. Vascular: No leg edema. Calves soft and nontender. Neuro: Alert and oriented. Motor strength 5 over 5. Knee DTRs 1+, symmetric.     Lab Results:  Lab Results  Component Value Date   WBC 11.7* 09/06/2014   HGB 13.0 09/06/2014   HCT 40.3 09/06/2014   MCV 97.4 09/06/2014   PLT 175 09/06/2014   NEUTROABS 10.8* 09/06/2014    Imaging:  No results found.  Medications: I have reviewed the patient's current medications.  Assessment/Plan: 1. Stage IIIB/IV non-small cell lung cancer, adenocarcinoma, diagnosed March 2015. Initially treated with carboplatin/Alimta for 6 cycles 04/13/2014 through 07/27/2014. Restaging CT evaluation after cycle 6 showed no significant evidence for disease progression.  2. Recurrent left pleural effusion status post placement of a left Pleurx catheter 08/26/2014. 3. Atrial fibrillation.   Disposition: [name] appears stable. The plan is to proceed with maintenance Alimta today as  scheduled. He will return for a followup visit and cycle 2 in 3 weeks. He will contact the office in the interim with any problems.    Shawn Espinoza ANP/GNP-BC   09/06/2014  4:17 PM  ADDENDUM: Hematology/Oncology Attending: I had a face to face encounter with the patient today. I recommended his care plan. This is a very pleasant 73 years old white male with metastatic non-small cell lung cancer, adenocarcinoma status post 6 cycles of induction chemotherapy with carboplatin and Alimta and no evidence for disease progression after cycle #6. The patient recently had a left Pleurx catheter placed by Dr. Prescott Gum. He continues to drain significant amount of pleural fluid a few times a week. His shortness of breath has significantly improved. I recommended for the patient to proceed with the first cycle of maintenance chemotherapy with single agent Alimta today as scheduled. He would come back for followup visit in 3 weeks with the next cycle of his treatment. He was advised to call immediately if he has any concerning symptoms in the interval.  Disclaimer: This note was dictated with voice recognition software. Similar sounding words can inadvertently be transcribed and may be missed upon review. Eilleen Kempf., MD 09/06/2014

## 2014-09-06 NOTE — Patient Instructions (Signed)
[facility] Discharge Instructions for Patients Receiving Chemotherapy  Today you received the following chemotherapy agents: Alimta.  To help prevent nausea and vomiting after your treatment, we encourage you to take your nausea medication as prescribed.   If you develop nausea and vomiting that is not controlled by your nausea medication, call the clinic.   BELOW ARE SYMPTOMS THAT SHOULD BE REPORTED IMMEDIATELY:  *FEVER GREATER THAN 100.5 F  *CHILLS WITH OR WITHOUT FEVER  NAUSEA AND VOMITING THAT IS NOT CONTROLLED WITH YOUR NAUSEA MEDICATION  *UNUSUAL SHORTNESS OF BREATH  *UNUSUAL BRUISING OR BLEEDING  TENDERNESS IN MOUTH AND THROAT WITH OR WITHOUT PRESENCE OF ULCERS  *URINARY PROBLEMS  *BOWEL PROBLEMS  UNUSUAL RASH Items with * indicate a potential emergency and should be followed up as soon as possible.  Feel free to call the clinic you have any questions or concerns. The clinic phone number is [phone].

## 2014-09-06 NOTE — Telephone Encounter (Signed)
Pt confirmed labs/ov per 09/15 POF, sent msg to add chemo, gave pt AVS....KJ

## 2014-09-06 NOTE — Telephone Encounter (Signed)
forgot to provide patient with compression stockings yesterday - plz mail or notify pt script is ready for pick up - in Kim's box.

## 2014-09-06 NOTE — Telephone Encounter (Signed)
Pt aware Rx will be mailed.

## 2014-09-07 ENCOUNTER — Telehealth: Payer: Self-pay | Admitting: *Deleted

## 2014-09-07 NOTE — Telephone Encounter (Signed)
Per staff message and POF I have scheduled appts. Advised scheduler of appts. JMW  

## 2014-09-14 ENCOUNTER — Ambulatory Visit
Admission: RE | Admit: 2014-09-14 | Discharge: 2014-09-14 | Disposition: A | Discharge status: Home / Self Care | Payer: Medicare Other | Source: Ambulatory Visit | Attending: Cardiothoracic Surgery | Admitting: Cardiothoracic Surgery

## 2014-09-14 ENCOUNTER — Ambulatory Visit (INDEPENDENT_AMBULATORY_CARE_PROVIDER_SITE_OTHER): Payer: Medicare Other | Admitting: Cardiothoracic Surgery

## 2014-09-14 ENCOUNTER — Other Ambulatory Visit: Payer: Self-pay | Admitting: *Deleted

## 2014-09-14 ENCOUNTER — Encounter: Payer: Self-pay | Admitting: Cardiothoracic Surgery

## 2014-09-14 VITALS — BP 118/79 | HR 83 | Resp 20 | Ht 69.0 in | Wt 186.0 lb

## 2014-09-14 DIAGNOSIS — J90 Pleural effusion, not elsewhere classified: Secondary | ICD-10-CM

## 2014-09-14 DIAGNOSIS — Z09 Encounter for follow-up examination after completed treatment for conditions other than malignant neoplasm: Secondary | ICD-10-CM

## 2014-09-14 DIAGNOSIS — J910 Malignant pleural effusion: Secondary | ICD-10-CM

## 2014-09-14 NOTE — Progress Notes (Signed)
PCP is [name], MD Referring Provider is Curt Bears, MD  Chief Complaint  Patient presents with  . Routine Post Op    c/o pain at pleurX site with SOB    HPI: 3 week followup after left Pleurx catheter placement for  Treatment recurrent malignant pleural effusion-advanced stage lung cancer. Drainage Monday Wednesday Friday schedule. Fluid removed ranges 600-900 cc. Of fluid is clear yellow.  The patient drove a tractor last week which resulted in some pain at the insertion site  I removed the skin suture around the exit site and there is some maceration of the skin with erythema-S. Keflex 500 mg by mouth twice a day is prescribed.  The patient will continue Monday Wednesday Friday drainage schedule. The patient is being supported nicely by the home health nurses.   Past Medical History  Diagnosis Date  . History of diabetes mellitus, type II     resolved with diet, h/o neuropathy  . Diverticulosis of colon   . Hyperlipidemia   . Hypertension   . Fatty liver 02/29/00    abd ultrasound  . Lower back pain   . Systolic murmur 5361    2Decho - normal LV fxn, EF 55%, mild AS, biatrial enlargement  . History of tobacco use quit 1990s  . 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 Personal history of colonic adenomas 07/06/2013  . Hypertensive retinopathy of both eyes 2015    mild  . 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 Dysrhythmia     Atrial fib (found 07/2014)  . Shortness of breath   . Pneumonia   . GERD (gastroesophageal reflux disease)     occasional  . Arthritis   . Neuropathy   . Constipation     Past Surgical History  Procedure Laterality Date  . Colonoscopy  2004  . Colonoscopy  2014    tubular adenoma x1, mod diverticulosis ([place])  . Flexible bronchoscopy  02/2014    WNL  . Video bronchoscopy  Bilateral 03/17/2014    Procedure: VIDEO BRONCHOSCOPY WITH FLUORO;  Surgeon: Tanda Rockers, MD;  Location: [facility] END[facility]COPY;  Service: Cardiopulmonary;  Laterality: Bilateral;  . Chest tube insertion Left 08/26/2014    Procedure: INSERTION OF LEFT  PLEURAL DRAINAGE CATHETER;  Surgeon: Ivin Poot, MD;  Location: [facility] OR;  Service: Thoracic;  Laterality: Left;    Family History  Problem Relation Age of Onset  . Stroke Mother     multiple mini strokes  . Hypertension Mother   . Heart disease Maternal Grandfather     MI  . Stroke Paternal Grandmother   . Colon cancer Neg Hx     Social History History  Substance Use Topics  . Smoking status: Former Smoker -- 3.00 packs/day for 30 years    Types: Cigarettes    Quit date: 12/23/1988  . Smokeless tobacco: Never Used  . Alcohol Use: 12.6 oz/week    21 Glasses of wine per week     Comment: 3-4 wine a day ( 8/03- 2 beers, 2 wines, 2 brandies daily)    Current Outpatient Prescriptions  Medication Sig Dispense Refill  . albuterol (PROVENTIL HFA;VENTOLIN HFA) 108 (90 BASE) MCG/ACT inhaler Inhale 2 puffs into the lungs every 6 (six) hours as needed for wheezing or shortness of breath.  1 Inhaler  2  . Alum & Mag Hydroxide-Simeth (MAGIC MOUTHWASH W/LIDOCAINE) SOLN Take 5 mLs by mouth 3 (three) times daily as needed for mouth pain.  240 mL  0  . amLODipine (NORVASC) 5 MG tablet Take 1 tablet (5 mg total) by mouth daily.  90 tablet  2  . apixaban (ELIQUIS) 5 MG TABS tablet Take 1 tablet (5 mg total) by mouth 2 (two) times daily.  60 tablet  1  . atenolol (TENORMIN) 25 MG tablet Take 1 tablet (25 mg total) by mouth 2 (two) times daily.  180 tablet  2  . cetirizine (ZYRTEC) 10 MG tablet Take 10 mg by mouth daily as needed for allergies or rhinitis.       . cholecalciferol (VITAMIN D) 1000 UNITS tablet Take 1,000 Units by mouth every morning.       Marland Kitchen dexamethasone (DECADRON) 4 MG tablet 4 mg by mouth twice a day the day before, day of and day after the  chemotherapy every 3 weeks.  40 tablet  1  . folic acid (FOLVITE) 1 MG tablet TAKE 1 TABLET BY MOUTH EVERY DAY  30 tablet  4  . furosemide (LASIX) 20 MG tablet Take 1 tablet (20 mg total) by mouth daily.  90 tablet  1  . glimepiride (AMARYL) 1 MG tablet Take 1 tablet (1 mg total) by mouth as directed. Take on days you take decadron.  30 tablet  0  . HYDROcodone-acetaminophen (NORCO/VICODIN) 5-325 MG per tablet Take 1 tablet by mouth every 6 (six) hours as needed for moderate pain.      Marland Kitchen ibuprofen (ADVIL,MOTRIN) 800 MG tablet Take 800 mg by mouth 2 (two) times daily as needed for moderate pain.      . methocarbamol (ROBAXIN) 500 MG tablet Take 500 mg by mouth every 8 (eight) hours as needed (pain).       . Multiple Vitamins-Minerals (EYE VITAMINS PO) Take 1 capsule by mouth 2 (two) times daily. Due to macular degeneration      . prochlorperazine (COMPAZINE) 10 MG tablet Take 1 tablet (10 mg total) by mouth every 6 (six) hours as needed for nausea or vomiting.  60 tablet  0  . sucralfate (CARAFATE) 1 GM/10ML suspension Take 1 g by mouth 4 (four) times daily as needed (reflux).       No current facility-administered medications for this visit.    Allergies  Allergen Reactions  . Candesartan Cilexetil     REACTION: Muscle spasms  . Diltiazem Hcl     REACTION: Dizziness  . Doxazosin Mesylate Other (See Comments)    REACTION: H/A's  . Nifedipine     REACTION: Leg swelling  . Pravastatin Other (See Comments)    Muscle aches  . Red Yeast Rice     Muscle aches  . Rosuvastatin Other (See Comments)    REACTION: questionable: severe constipation  . Simvastatin Other (See Comments)    REACTION: Muscle aches    Review of Systems mildly productive cough   mild weight loss  BP 118/79  Pulse 83  Resp 20  Ht 5\' 9"  (1.753 m)  Wt 186 lb (84.369 kg)  BMI 27.45 kg/m2  SpO2 96% Physical Exam Alert and comfortable Lungs clear Exit site suture removed from Pleurx with some erythema-Neosporin  applied and patient will be started on Keflex Heart rate regular  Diagnostic Tests: Chest x-ray shows clear lung fields no pleural effusion Pleurx catheter in good position  Impression: Pleurx catheter is functioning well Patient having  some pain at the exit site and intrathoracic pain at the end of the drainage sessions.  Plan: Local skin care at the exit site with supplementation of antibiotic-by mouth Keflex Drainage session will be terminated once patient starts having discomfort from the catheter attaching to the chest wall pleural surface  Return in 4 weeks for wound check and assessment

## 2014-09-27 ENCOUNTER — Ambulatory Visit (HOSPITAL_BASED_OUTPATIENT_CLINIC_OR_DEPARTMENT_OTHER): Payer: Medicare Other

## 2014-09-27 ENCOUNTER — Other Ambulatory Visit (HOSPITAL_BASED_OUTPATIENT_CLINIC_OR_DEPARTMENT_OTHER): Payer: Medicare Other

## 2014-09-27 ENCOUNTER — Ambulatory Visit (HOSPITAL_BASED_OUTPATIENT_CLINIC_OR_DEPARTMENT_OTHER): Payer: Medicare Other | Admitting: Internal Medicine

## 2014-09-27 ENCOUNTER — Encounter: Payer: Self-pay | Admitting: Internal Medicine

## 2014-09-27 VITALS — BP 129/76 | HR 73 | Temp 97.8°F | Resp 18 | Ht 69.0 in | Wt 183.6 lb

## 2014-09-27 DIAGNOSIS — C3411 Malignant neoplasm of upper lobe, right bronchus or lung: Secondary | ICD-10-CM

## 2014-09-27 DIAGNOSIS — D6481 Anemia due to antineoplastic chemotherapy: Secondary | ICD-10-CM

## 2014-09-27 DIAGNOSIS — Z5111 Encounter for antineoplastic chemotherapy: Secondary | ICD-10-CM

## 2014-09-27 DIAGNOSIS — C3492 Malignant neoplasm of unspecified part of left bronchus or lung: Secondary | ICD-10-CM

## 2014-09-27 DIAGNOSIS — C3490 Malignant neoplasm of unspecified part of unspecified bronchus or lung: Secondary | ICD-10-CM

## 2014-09-27 DIAGNOSIS — I4891 Unspecified atrial fibrillation: Secondary | ICD-10-CM

## 2014-09-27 DIAGNOSIS — J90 Pleural effusion, not elsewhere classified: Secondary | ICD-10-CM

## 2014-09-27 LAB — CBC WITH DIFFERENTIAL/PLATELET
BASO%: 0.2 % (ref 0.0–2.0)
BASOS ABS: 0 10*3/uL (ref 0.0–0.1)
EOS ABS: 0 10*3/uL (ref 0.0–0.5)
EOS%: 0 % (ref 0.0–7.0)
HCT: 40.3 % (ref 38.4–49.9)
HEMOGLOBIN: 13.1 g/dL (ref 13.0–17.1)
LYMPH%: 4.1 % — AB (ref 14.0–49.0)
MCH: 30.1 pg (ref 27.2–33.4)
MCHC: 32.6 g/dL (ref 32.0–36.0)
MCV: 92.5 fL (ref 79.3–98.0)
MONO#: 0.4 10*3/uL (ref 0.1–0.9)
MONO%: 4.4 % (ref 0.0–14.0)
NEUT%: 91.3 % — ABNORMAL HIGH (ref 39.0–75.0)
NEUTROS ABS: 7.6 10*3/uL — AB (ref 1.5–6.5)
PLATELETS: 305 10*3/uL (ref 140–400)
RBC: 4.36 10*6/uL (ref 4.20–5.82)
RDW: 15 % — ABNORMAL HIGH (ref 11.0–14.6)
WBC: 8.4 10*3/uL (ref 4.0–10.3)
lymph#: 0.3 10*3/uL — ABNORMAL LOW (ref 0.9–3.3)

## 2014-09-27 LAB — COMPREHENSIVE METABOLIC PANEL (CC13)
ALBUMIN: 2.9 g/dL — AB (ref 3.5–5.0)
ALK PHOS: 69 U/L (ref 40–150)
ALT: 16 U/L (ref 0–55)
ANION GAP: 9 meq/L (ref 3–11)
AST: 14 U/L (ref 5–34)
BILIRUBIN TOTAL: 0.48 mg/dL (ref 0.20–1.20)
BUN: 20.5 mg/dL (ref 7.0–26.0)
CO2: 27 mEq/L (ref 22–29)
Calcium: 9.4 mg/dL (ref 8.4–10.4)
Chloride: 104 mEq/L (ref 98–109)
Creatinine: 0.8 mg/dL (ref 0.7–1.3)
Glucose: 270 mg/dl — ABNORMAL HIGH (ref 70–140)
Potassium: 4.3 mEq/L (ref 3.5–5.1)
SODIUM: 140 meq/L (ref 136–145)
TOTAL PROTEIN: 6.2 g/dL — AB (ref 6.4–8.3)

## 2014-09-27 MED ORDER — DEXAMETHASONE SODIUM PHOSPHATE 10 MG/ML IJ SOLN
10.0000 mg | Freq: Once | INTRAMUSCULAR | Status: AC
  Administered 2014-09-27: 10 mg via INTRAVENOUS

## 2014-09-27 MED ORDER — SODIUM CHLORIDE 0.9 % IV SOLN
500.0000 mg/m2 | Freq: Once | INTRAVENOUS | Status: AC
  Administered 2014-09-27: 1000 mg via INTRAVENOUS
  Filled 2014-09-27: qty 40

## 2014-09-27 MED ORDER — SODIUM CHLORIDE 0.9 % IV SOLN
Freq: Once | INTRAVENOUS | Status: AC
  Administered 2014-09-27: 13:00:00 via INTRAVENOUS

## 2014-09-27 MED ORDER — DEXAMETHASONE SODIUM PHOSPHATE 10 MG/ML IJ SOLN
INTRAMUSCULAR | Status: AC
  Filled 2014-09-27: qty 1

## 2014-09-27 MED ORDER — ONDANSETRON 8 MG/50ML IVPB (CHCC)
8.0000 mg | Freq: Once | INTRAVENOUS | Status: AC
  Administered 2014-09-27: 8 mg via INTRAVENOUS

## 2014-09-27 MED ORDER — ONDANSETRON 8 MG/NS 50 ML IVPB
INTRAVENOUS | Status: AC
  Filled 2014-09-27: qty 8

## 2014-09-27 NOTE — Progress Notes (Signed)
[place] [phone]   Fax:([phone]) [place], [place] [address]  DIAGNOSIS: Stage IIIB/IV non-small cell lung cancer, adenocarcinoma diagnosed in March of 2015.  PRIOR THERAPY: Systemic chemotherapy with carboplatin for AUC of 5 and Alimta 500 mg/M2 every 3 weeks. First dose expected on 04/13/2014. Status post 6 cycles..  CURRENT THERAPY: maintenance chemotherapy with single agent Alimta 500 mg/M2 every 3 weeks. First cycle on 09/06/2014. Status post 1 cycle.  INTERVAL HISTORY: Shawn Espinoza 73 y.o. male returns to the clinic today for follow up visit. He is feeling fine and tolerated the first cycle of his systemic chemotherapy with single agent Alimta fairly well with no significant adverse effects. [place] He continues to have mild swelling of the lower extremities. He also continues to drain around 500-600 mL of pleural fluid from the Pleuryx catheter every 2 days. He denied having any significant chest pain but continues to have shortness breath and mild cough with no hemoptysis. He has no significant fever or chills, no nausea or vomiting. He is here today to start cycle #2 of the maintenance chemotherapy.  MEDICAL HISTORY: Past Medical History  Diagnosis Date  . History of diabetes mellitus, type II     resolved with diet, h/o neuropathy  . Diverticulosis of colon   . Hyperlipidemia   . Hypertension   . Fatty liver 02/29/00    abd ultrasound  . Lower back pain   . Systolic murmur 5462    2Decho - normal LV fxn, EF 55%, mild AS, biatrial enlargement  . History of tobacco use quit 1990s  . 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 Personal history of colonic adenomas 07/06/2013  . Hypertensive retinopathy of  both eyes 2015    mild  . 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 Dysrhythmia     Atrial fib (found 07/2014)  . Shortness of breath   . Pneumonia   . GERD (gastroesophageal reflux disease)     occasional  . Arthritis   . Neuropathy   . Constipation     ALLERGIES:  is allergic to candesartan cilexetil; diltiazem hcl; doxazosin mesylate; nifedipine; pravastatin; red yeast rice; rosuvastatin; and simvastatin.  MEDICATIONS:  Current Outpatient Prescriptions  Medication Sig Dispense Refill  . albuterol (PROVENTIL HFA;VENTOLIN HFA) 108 (90 BASE) MCG/ACT inhaler Inhale 2 puffs into the lungs every 6 (six) hours as needed for wheezing or shortness of breath.  1 Inhaler  2  . Alum & Mag Hydroxide-Simeth (MAGIC MOUTHWASH W/LIDOCAINE) SOLN Take 5 mLs by mouth 3 (three) times daily as needed for mouth pain.  240 mL  0  . amLODipine (NORVASC) 5 MG tablet Take 1 tablet (5 mg total) by mouth daily.  90 tablet  2  . apixaban (ELIQUIS) 5 MG TABS tablet Take 1 tablet (5 mg total) by mouth 2 (two) times daily.  60 tablet  1  . atenolol (TENORMIN) 25 MG tablet Take 1 tablet (25 mg total) by mouth 2 (two) times daily.  180 tablet  2  . cetirizine (ZYRTEC) 10 MG tablet Take 10 mg by mouth daily as needed for allergies or rhinitis.       Marland Kitchen  cholecalciferol (VITAMIN D) 1000 UNITS tablet Take 1,000 Units by mouth every morning.       Marland Kitchen dexamethasone (DECADRON) 4 MG tablet 4 mg by mouth twice a day the day before, day of and day after the chemotherapy every 3 weeks.  40 tablet  1  . folic acid (FOLVITE) 1 MG tablet TAKE 1 TABLET BY MOUTH EVERY DAY  30 tablet  4  . glimepiride (AMARYL) 1 MG tablet Take 1 tablet (1 mg total) by mouth as directed. Take on days you take decadron.  30 tablet  0  . ibuprofen (ADVIL,MOTRIN) 800 MG tablet Take 800 mg by mouth 2 (two) times daily as needed for moderate pain.      . methocarbamol (ROBAXIN) 500 MG tablet Take 500 mg by mouth every 8 (eight)  hours as needed (pain).       . Multiple Vitamins-Minerals (EYE VITAMINS PO) Take 1 capsule by mouth 2 (two) times daily. Due to macular degeneration      . prochlorperazine (COMPAZINE) 10 MG tablet Take 1 tablet (10 mg total) by mouth every 6 (six) hours as needed for nausea or vomiting.  60 tablet  0  . sucralfate (CARAFATE) 1 GM/10ML suspension Take 1 g by mouth 4 (four) times daily as needed (reflux).      . furosemide (LASIX) 20 MG tablet Take 1 tablet (20 mg total) by mouth daily.  90 tablet  1  . HYDROcodone-acetaminophen (NORCO/VICODIN) 5-325 MG per tablet Take 1 tablet by mouth every 6 (six) hours as needed for moderate pain.       No current facility-administered medications for this visit.    SURGICAL HISTORY:  Past Surgical History  Procedure Laterality Date  . Colonoscopy  2004  . Colonoscopy  2014    tubular adenoma x1, mod diverticulosis ([facility])  . Flexible bronchoscopy  02/2014    WNL  . Video bronchoscopy Bilateral 03/17/2014    Procedure: VIDEO BRONCHOSCOPY WITH FLUORO;  Surgeon: Tanda Rockers, MD;  Location: [facility];  Service: Cardiopulmonary;  Laterality: Bilateral;  . Chest tube insertion Left 08/26/2014    Procedure: INSERTION OF LEFT  PLEURAL DRAINAGE CATHETER;  Surgeon: Ivin Poot, MD;  Location: [facility];  Service: Thoracic;  Laterality: Left;    REVIEW OF SYSTEMS:  Constitutional: positive for fatigue Eyes: negative Ears, nose, mouth, throat, and face: negative Respiratory: positive for cough and dyspnea on exertion Cardiovascular: negative Gastrointestinal: negative Genitourinary:negative Integument/breast: negative Hematologic/lymphatic: negative Musculoskeletal:negative Neurological: negative Behavioral/Psych: negative Endocrine: negative Allergic/Immunologic: negative   PHYSICAL EXAMINATION: General appearance: alert, cooperative, fatigued and no distress Head: Normocephalic, without obvious abnormality, atraumatic Neck: no adenopathy, no  JVD, supple, symmetrical, trachea midline and thyroid not enlarged, symmetric, no tenderness/mass/nodules Lymph nodes: Cervical, supraclavicular, and axillary nodes normal. Resp: diminished breath sounds LLL and dullness to percussion LLL Back: symmetric, no curvature. ROM normal. No CVA tenderness. Cardio: regular rate and rhythm, S1, S2 normal, no murmur, click, rub or gallop GI: soft, non-tender; bowel sounds normal; no masses,  no organomegaly Extremities: extremities normal, atraumatic, no cyanosis or edema Neurologic: Alert and oriented X 3, normal strength and tone. Normal symmetric reflexes. Normal coordination and gait  ECOG PERFORMANCE STATUS: 1 - Symptomatic but completely ambulatory  Blood pressure 129/76, pulse 73, temperature 97.8 F (36.6 C), temperature source Oral, resp. rate 18, height 5\' 9"  (1.753 m), weight 183 lb 9.6 oz (83.28 kg).  LABORATORY DATA: Lab Results  Component Value Date   WBC 8.4 09/27/2014  HGB 13.1 09/27/2014   HCT 40.3 09/27/2014   MCV 92.5 09/27/2014   PLT 305 09/27/2014      Chemistry      Component Value Date/Time   NA 140 09/27/2014 1056   NA 140 08/26/2014 1246   K 4.3 09/27/2014 1056   K 4.5 08/26/2014 1246   CL 100 08/26/2014 1246   CO2 27 09/27/2014 1056   CO2 29 08/26/2014 1246   BUN 20.5 09/27/2014 1056   BUN 16 08/26/2014 1246   CREATININE 0.8 09/27/2014 1056   CREATININE 0.76 08/26/2014 1246   CREATININE 0.83 02/22/2014 1314      Component Value Date/Time   CALCIUM 9.4 09/27/2014 1056   CALCIUM 8.7 08/26/2014 1246   ALKPHOS 69 09/27/2014 1056   ALKPHOS 68 08/13/2014 1222   AST 14 09/27/2014 1056   AST 21 08/13/2014 1222   ALT 16 09/27/2014 1056   ALT 15 08/13/2014 1222   BILITOT 0.48 09/27/2014 1056   BILITOT 1.3* 08/13/2014 1222       RADIOGRAPHIC STUDIES:  ASSESSMENT AND PLAN: this is a very pleasant 73 years old white male with:  1) Stage IV non-small cell lung cancer, adenocarcinoma presenting with large right upper lobe lung mass in  addition to mediastinal and bilateral hilar lymphadenopathy as well as cervical lymphadenopathy and left pleural effusion. The patient completed a course systemic chemotherapy with carboplatin and Alimta status post 6 cycles. He is tolerating his treatment fairly well except for fatigue secondary to chemotherapy-induced anemia. He is currently undergoing maintenance chemotherapy with single agent Alimta and tolerating it well. We will proceed with cycle #2 today as scheduled.  2) Recurrent left pleural effusion: He is status post Pleurx catheter placement. Continue drainage of the pleural fluid 2-3 times a week. He has a followup appointment is scheduled with Dr. Tharon Aquas Trigt.  3) Atrial fibrillation: continue Aliquis.  The patient would come back for followup visit in 3 weeks with the start of the next cycle of his chemotherapy for evaluation and management any adverse effect of his treatment.  He was advised to call immediately if he has any concerning symptoms in the interval.  The patient voices understanding of current disease status and treatment options and is in agreement with the current care plan.  All questions were answered. The patient knows to call the clinic with any problems, questions or concerns. We can certainly see the patient much sooner if necessary.  Disclaimer: This note was dictated with voice recognition software. Similar sounding words can inadvertently be transcribed and may not be corrected upon review.

## 2014-09-27 NOTE — Patient Instructions (Signed)
[facility] Discharge Instructions for Patients Receiving Chemotherapy  Today you received the following chemotherapy agents Alimta.   To help prevent nausea and vomiting after your treatment, we encourage you to take your nausea medication.   If you develop nausea and vomiting that is not controlled by your nausea medication, call the clinic.   BELOW ARE SYMPTOMS THAT SHOULD BE REPORTED IMMEDIATELY:  *FEVER GREATER THAN 100.5 F  *CHILLS WITH OR WITHOUT FEVER  NAUSEA AND VOMITING THAT IS NOT CONTROLLED WITH YOUR NAUSEA MEDICATION  *UNUSUAL SHORTNESS OF BREATH  *UNUSUAL BRUISING OR BLEEDING  TENDERNESS IN MOUTH AND THROAT WITH OR WITHOUT PRESENCE OF ULCERS  *URINARY PROBLEMS  *BOWEL PROBLEMS  UNUSUAL RASH Items with * indicate a potential emergency and should be followed up as soon as possible.  Feel free to call the clinic you have any questions or concerns. The clinic phone number is [phone].

## 2014-09-28 ENCOUNTER — Ambulatory Visit: Payer: Medicare Other | Admitting: Cardiothoracic Surgery

## 2014-09-29 ENCOUNTER — Telehealth: Payer: Self-pay | Admitting: Internal Medicine

## 2014-09-29 NOTE — Telephone Encounter (Signed)
Per 10/06 ML 3 wk f/u, pt was already sch for 10/27 labs/chemo, had to r/s labs w/ML on day before due to no MD/ML availability on same day as chemo, mailed out sch..... KJ

## 2014-10-04 ENCOUNTER — Other Ambulatory Visit: Payer: Self-pay | Admitting: Nurse Practitioner

## 2014-10-04 ENCOUNTER — Ambulatory Visit (HOSPITAL_BASED_OUTPATIENT_CLINIC_OR_DEPARTMENT_OTHER): Payer: Medicare Other

## 2014-10-04 ENCOUNTER — Ambulatory Visit (HOSPITAL_BASED_OUTPATIENT_CLINIC_OR_DEPARTMENT_OTHER): Payer: Medicare Other | Admitting: Nurse Practitioner

## 2014-10-04 ENCOUNTER — Telehealth: Payer: Self-pay | Admitting: Medical Oncology

## 2014-10-04 ENCOUNTER — Ambulatory Visit (HOSPITAL_COMMUNITY)
Admission: RE | Admit: 2014-10-04 | Discharge: 2014-10-04 | Disposition: A | Discharge status: Home / Self Care | Payer: Medicare Other | Source: Ambulatory Visit | Attending: Nurse Practitioner | Admitting: Nurse Practitioner

## 2014-10-04 ENCOUNTER — Other Ambulatory Visit: Payer: Self-pay | Admitting: *Deleted

## 2014-10-04 ENCOUNTER — Encounter: Payer: Self-pay | Admitting: Nurse Practitioner

## 2014-10-04 ENCOUNTER — Ambulatory Visit: Payer: Medicare Other | Admitting: Nurse Practitioner

## 2014-10-04 VITALS — BP 112/72 | HR 90 | Temp 97.9°F | Resp 18 | Ht 69.0 in | Wt 179.3 lb

## 2014-10-04 VITALS — BP 129/74 | HR 89 | Temp 98.0°F | Resp 18

## 2014-10-04 DIAGNOSIS — E860 Dehydration: Secondary | ICD-10-CM | POA: Insufficient documentation

## 2014-10-04 DIAGNOSIS — C3492 Malignant neoplasm of unspecified part of left bronchus or lung: Secondary | ICD-10-CM

## 2014-10-04 DIAGNOSIS — J90 Pleural effusion, not elsewhere classified: Secondary | ICD-10-CM

## 2014-10-04 DIAGNOSIS — E8809 Other disorders of plasma-protein metabolism, not elsewhere classified: Secondary | ICD-10-CM

## 2014-10-04 DIAGNOSIS — R05 Cough: Secondary | ICD-10-CM | POA: Insufficient documentation

## 2014-10-04 DIAGNOSIS — R5381 Other malaise: Secondary | ICD-10-CM

## 2014-10-04 DIAGNOSIS — R5383 Other fatigue: Secondary | ICD-10-CM | POA: Insufficient documentation

## 2014-10-04 DIAGNOSIS — C3490 Malignant neoplasm of unspecified part of unspecified bronchus or lung: Secondary | ICD-10-CM

## 2014-10-04 DIAGNOSIS — R530 Neoplastic (malignant) related fatigue: Secondary | ICD-10-CM

## 2014-10-04 DIAGNOSIS — R509 Fever, unspecified: Secondary | ICD-10-CM

## 2014-10-04 DIAGNOSIS — J40 Bronchitis, not specified as acute or chronic: Secondary | ICD-10-CM

## 2014-10-04 DIAGNOSIS — C3412 Malignant neoplasm of upper lobe, left bronchus or lung: Secondary | ICD-10-CM

## 2014-10-04 DIAGNOSIS — R0600 Dyspnea, unspecified: Secondary | ICD-10-CM | POA: Insufficient documentation

## 2014-10-04 DIAGNOSIS — R197 Diarrhea, unspecified: Secondary | ICD-10-CM

## 2014-10-04 DIAGNOSIS — D61818 Other pancytopenia: Secondary | ICD-10-CM

## 2014-10-04 LAB — CBC WITH DIFFERENTIAL/PLATELET
BASO%: 0.3 % (ref 0.0–2.0)
Basophils Absolute: 0 10*3/uL (ref 0.0–0.1)
EOS%: 1.6 % (ref 0.0–7.0)
Eosinophils Absolute: 0 10*3/uL (ref 0.0–0.5)
HCT: 36.9 % — ABNORMAL LOW (ref 38.4–49.9)
HGB: 12 g/dL — ABNORMAL LOW (ref 13.0–17.1)
LYMPH%: 29.6 % (ref 14.0–49.0)
MCH: 29.5 pg (ref 27.2–33.4)
MCHC: 32.5 g/dL (ref 32.0–36.0)
MCV: 90.8 fL (ref 79.3–98.0)
MONO#: 0.3 10*3/uL (ref 0.1–0.9)
MONO%: 28.2 % — AB (ref 0.0–14.0)
NEUT#: 0.4 10*3/uL — CL (ref 1.5–6.5)
NEUT%: 40.3 % (ref 39.0–75.0)
PLATELETS: 116 10*3/uL — AB (ref 140–400)
RBC: 4.06 10*6/uL — AB (ref 4.20–5.82)
RDW: 15 % — ABNORMAL HIGH (ref 11.0–14.6)
WBC: 1.1 10*3/uL — ABNORMAL LOW (ref 4.0–10.3)
lymph#: 0.3 10*3/uL — ABNORMAL LOW (ref 0.9–3.3)

## 2014-10-04 LAB — URINALYSIS, MICROSCOPIC - CHCC
Blood: NEGATIVE
GLUCOSE UR CHCC: NEGATIVE mg/dL
Ketones: NEGATIVE mg/dL
Leukocyte Esterase: NEGATIVE
Nitrite: NEGATIVE
Protein: 100 mg/dL
Specific Gravity, Urine: 1.02 (ref 1.003–1.035)
UROBILINOGEN UR: 0.2 mg/dL (ref 0.2–1)
pH: 6.5 (ref 4.6–8.0)

## 2014-10-04 LAB — COMPREHENSIVE METABOLIC PANEL (CC13)
ALT: 15 U/L (ref 0–55)
AST: 15 U/L (ref 5–34)
Albumin: 2.9 g/dL — ABNORMAL LOW (ref 3.5–5.0)
Alkaline Phosphatase: 62 U/L (ref 40–150)
Anion Gap: 9 mEq/L (ref 3–11)
BILIRUBIN TOTAL: 1.12 mg/dL (ref 0.20–1.20)
BUN: 19.3 mg/dL (ref 7.0–26.0)
CO2: 29 mEq/L (ref 22–29)
Calcium: 8.8 mg/dL (ref 8.4–10.4)
Chloride: 102 mEq/L (ref 98–109)
Creatinine: 0.9 mg/dL (ref 0.7–1.3)
GLUCOSE: 272 mg/dL — AB (ref 70–140)
Potassium: 4.7 mEq/L (ref 3.5–5.1)
SODIUM: 139 meq/L (ref 136–145)
TOTAL PROTEIN: 5.9 g/dL — AB (ref 6.4–8.3)

## 2014-10-04 MED ORDER — ALBUTEROL SULFATE (2.5 MG/3ML) 0.083% IN NEBU
2.5000 mg | INHALATION_SOLUTION | Freq: Once | RESPIRATORY_TRACT | Status: AC
  Administered 2014-10-04: 2.5 mg via RESPIRATORY_TRACT
  Filled 2014-10-04: qty 3

## 2014-10-04 MED ORDER — SODIUM CHLORIDE 0.9 % IV SOLN
INTRAVENOUS | Status: AC
  Administered 2014-10-04: 13:00:00 via INTRAVENOUS

## 2014-10-04 MED ORDER — LEVOFLOXACIN 500 MG PO TABS: 500.0000 mg | ORAL_TABLET | Freq: Every day | ORAL | Status: DC

## 2014-10-04 MED ORDER — HYDROCOD POLST-CHLORPHEN POLST 10-8 MG/5ML PO LQCR: 5.0000 mL | Freq: Two times a day (BID) | ORAL | Status: DC | PRN

## 2014-10-04 NOTE — Assessment & Plan Note (Signed)
Patient continues with his chronic left Pleurx catheter; which she drains every 2 days or so.  He states that draining the catheter does help him feel somewhat less short of breath.  Chest x-ray obtained this afternoon at revealed a stable chest; with only a small left pleural effusion.  Patient has plans to follow back up with Dr. Tharon Aquas Trigt on 10/12/2014.

## 2014-10-04 NOTE — Progress Notes (Signed)
Patient instructed to go to radiology for chest x-ray after leaving infusion room for IV fluids. Patient agreeable to this. Pt also sent home with lab cup, instructions and hat for collecting stool sample to test for C. Diff. Pt instructed to keep in the refrigerator if he can't bring the sample back within 2 hours.

## 2014-10-04 NOTE — Telephone Encounter (Signed)
I returned wifes call . Pt with chills, "escalating cough and rattling in his chest " , green sputum , diarrhea x 24 hours. Appt made for symptom management today.

## 2014-10-04 NOTE — Patient Instructions (Addendum)
Neutropenia Neutropenia is a condition that occurs when the level of a certain type of white blood cell (neutrophil) in your body becomes lower than normal. Neutrophils are made in the bone marrow and fight infections. These cells protect against bacteria and viruses. The fewer neutrophils you have, and the longer your body remains without them, the greater your risk of getting a severe infection becomes. CAUSES  The cause of neutropenia may be hard to determine. However, it is usually due to 3 main problems:   Decreased production of neutrophils. This may be due to:  Certain medicines such as chemotherapy.  Genetic problems.  Cancer.  Radiation treatments.  Vitamin deficiency.  Some pesticides.  Increased destruction of neutrophils. This may be due to:  Overwhelming infections.  Hemolytic anemia. This is when the body destroys its own blood cells.  Chemotherapy.  Neutrophils moving to areas of the body where they cannot fight infections. This may be due to:  Dialysis procedures.  Conditions where the spleen becomes enlarged. Neutrophils are held in the spleen and are not available to the rest of the body.  Overwhelming infections. The neutrophils are held in the area of the infection and are not available to the rest of the body. SYMPTOMS  There are no specific symptoms of neutropenia. The lack of neutrophils can result in an infection, and an infection can cause various problems. DIAGNOSIS  Diagnosis is made by a blood test. A complete blood count is performed. The normal level of neutrophils in human blood differs with age and race. Infants have lower counts than older children and adults. African Americans have lower counts than Caucasians or Asians. The average adult level is 1500 cells/mm3 of blood. Neutrophil counts are interpreted as follows:  Greater than 1000 cells/mm3 gives normal protection against infection.  500 to 1000 cells/mm3 gives an increased risk for  infection.  200 to 500 cells/mm3 is a greater risk for severe infection.  Lower than 200 cells/mm3 is a marked risk of infection. This may require hospitalization and treatment with antibiotic medicines. TREATMENT  Treatment depends on the underlying cause, severity, and presence of infections or symptoms. It also depends on your health. Your caregiver will discuss the treatment plan with you. Mild cases are often easily treated and have a good outcome. Preventative measures may also be started to limit your risk of infections. Treatment can include:  Taking antibiotics.  Stopping medicines that are known to cause neutropenia.  Correcting nutritional deficiencies by eating green vegetables to supply folic acid and taking vitamin B supplements.  Stopping exposure to pesticides if your neutropenia is related to pesticide exposure.  Taking a blood growth factor called sargramostim, pegfilgrastim, or filgrastim if you are undergoing chemotherapy for cancer. This stimulates white blood cell production.  Removal of the spleen if you have Felty's syndrome and have repeated infections. HOME CARE INSTRUCTIONS   Follow your caregiver's instructions about when you need to have blood work done.  Wash your hands often. Make sure others who come in contact with you also wash their hands.  Wash raw fruits and vegetables before eating them. They can carry bacteria and fungi.  Avoid people with colds or spreadable (contagious) diseases (chickenpox, herpes zoster, influenza).  Avoid large crowds.  Avoid construction areas. The dust can release fungus into the air.  Be cautious around children in daycare or school environments.  Take care of your respiratory system by coughing and deep breathing.  Bathe daily.  Protect your skin from cuts and   burns.  Do not work in the garden or with flowers and plants.  Care for the mouth before and after meals by brushing with a soft toothbrush. If you have  mucositis, do not use mouthwash. Mouthwash contains alcohol and can dry out the mouth even more.  Clean the area between the genitals and the anus (perineal area) after urination and bowel movements. Women need to wipe from front to back.  Use a water soluble lubricant during sexual intercourse and practice good hygiene after. Do not have intercourse if you are severely neutropenic. Check with your caregiver for guidelines.  Exercise daily as tolerated.  Avoid people who were vaccinated with a live vaccine in the past 30 days. You should not receive live vaccines (polio, typhoid).  Do not provide direct care for pets. Avoid animal droppings. Do not clean litter boxes and bird cages.  Do not share food utensils.  Do not use tampons, enemas, or rectal suppositories unless directed by your caregiver.  Use an electric razor to remove hair.  Wash your hands after handling magazines, letters, and newspapers. SEEK IMMEDIATE MEDICAL CARE IF:   You have a fever.  You have chills or start to shake.  You feel nauseous or vomit.  You develop mouth sores.  You develop aches and pains.  You have redness and swelling around open wounds.  Your skin is warm to the touch.  You have pus coming from your wounds.  You develop swollen lymph nodes.  You feel weak or fatigued.  You develop red streaks on the skin. MAKE SURE YOU:  Understand these instructions.  Will watch your condition.  Will get help right away if you are not doing well or get worse. Document Released: 05/31/2002 Document Revised: 03/02/2012 Document Reviewed: 06/28/2011 [employer] Patient Information 2015 [employer]. This information is not intended to replace advice given to you by your health care provider. Make sure you discuss any questions you have with your health care provider.  

## 2014-10-04 NOTE — Assessment & Plan Note (Signed)
Patient received cycle 2 of single agent Alimta chemotherapy on 09/27/2014.  He will be due to return for both labs and a followup visit on 10/17/2014; and will receive cycle 3 of the same chemotherapy regimen on 10/18/2014.

## 2014-10-04 NOTE — Assessment & Plan Note (Signed)
Patient had a congested cough and wheezes throughout all lung fields on exam today.  Patient was given an albuterol nebulizer treatment; which did seem to greatly help.  He was given a prescription for Levaquin 500 mg on a daily basis for a total of 7 days for bronchitis.  He also requested and was given a prescription for Tussionex cough syrup.

## 2014-10-04 NOTE — Progress Notes (Signed)
SYMPTOM MANAGEMENT CLINIC   HPI: Shawn Espinoza 73 y.o. male diagnosed with lung cancer.  Currently undergoing single agent Alimta chemotherapy regimen.  Patient called cancer Center today requesting urgent care visit.  He is complaining of fever and chills, continued chronic cough with productive sputum, and new onset diarrhea within the past few days.  He also feels increased fatigue; and feels dehydrated.  He denies any nausea or vomiting.  He denies any UTI symptoms as well.  Patient has a chronic left Pleurx catheter which he drains on an every at today basis typically.  He has an appointment for followup with Dr. Tharon Aquas Trigt on 10/12/2014.     HPI  CURRENT THERAPY: Upcoming Treatment Dates - LUNG Pemetrexed (Alimta) q28d Days with orders from any treatment category:  10/18/2014      [facility] ON[facility] SCHEDULING COMMUNICATION      ondansetron (ZOFRAN) IVPB 8 mg      dexamethasone (DECADRON) injection 10 mg      PEMEtrexed (ALIMTA) 1,000 mg in sodium chloride 0.9 % 100 mL chemo infusion      sodium chloride 0.9 % injection 10 mL      heparin lock flush 100 unit/mL      heparin lock flush 100 unit/mL      alteplase (CATHFLO ACTIVASE) injection 2 mg      sodium chloride 0.9 % injection 3 mL      cyanocobalamin ((VITAMIN B-12)) injection 1,000 mcg      0.9 %  sodium chloride infusion      TREATMENT CONDITIONS 11/08/2014      [facility] ON[facility] SCHEDULING COMMUNICATION      ONDANSETRON IVPB ORDERABLE ([facility])      DEXAMETHASONE INJECTION ORDERABLE [facility]      PEMEtrexed (ALIMTA) 1,000 mg in sodium chloride 0.9 % 100 mL chemo infusion      SODIUM CHLORIDE 0.9 % IJ SOLN      HEPARIN SODIUM LOCK FLUSH 100 UNIT/ML IV SOLN      HEPARIN SODIUM LOCK FLUSH 100 UNIT/ML IV SOLN      ALTEPLASE 2 MG IJ SOLR      SODIUM CHLORIDE 0.9 % IJ SOLN      SODIUM CHLORIDE 0.9 % IV SOLN      TREATMENT CONDITIONS 11/29/2014      [facility] ON[facility] SCHEDULING COMMUNICATION      ONDANSETRON IVPB ORDERABLE ([facility])  DEXAMETHASONE INJECTION ORDERABLE [facility]      PEMEtrexed (ALIMTA) 1,000 mg in sodium chloride 0.9 % 100 mL chemo infusion      SODIUM CHLORIDE 0.9 % IJ SOLN      HEPARIN SODIUM LOCK FLUSH 100 UNIT/ML IV SOLN      HEPARIN SODIUM LOCK FLUSH 100 UNIT/ML IV SOLN      ALTEPLASE 2 MG IJ SOLR      SODIUM CHLORIDE 0.9 % IJ SOLN      SODIUM CHLORIDE 0.9 % IV SOLN      TREATMENT CONDITIONS    ROS  Past Medical History  Diagnosis Date  . History of diabetes mellitus, type II     resolved with diet, h/o neuropathy  . Diverticulosis of colon   . Hyperlipidemia   . Hypertension   . Fatty liver 02/29/00    abd ultrasound  . Lower back pain   . Systolic murmur 1103    2Decho - normal LV fxn, EF 55%, mild AS, biatrial enlargement  . History of tobacco use quit 1990s  . Positive hepatitis  C antibody test 2013    HCV RNA negative - ?cleared infection  . 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 Personal history of colonic adenomas 07/06/2013  . Hypertensive retinopathy of both eyes 2015    mild  . 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 Dysrhythmia     Atrial fib (found 07/2014)  . Shortness of breath   . Pneumonia   . GERD (gastroesophageal reflux disease)     occasional  . Arthritis   . Neuropathy   . Constipation     Past Surgical History  Procedure Laterality Date  . Colonoscopy  2004  . Colonoscopy  2014    tubular adenoma x1, mod diverticulosis ([place])  . Flexible bronchoscopy  02/2014    WNL  . Video bronchoscopy Bilateral 03/17/2014    Procedure: VIDEO BRONCHOSCOPY WITH FLUORO;  Surgeon: Tanda Rockers, MD;  Location: [facility];  Service: Cardiopulmonary;  Laterality: Bilateral;  . Chest tube insertion Left 08/26/2014    Procedure: INSERTION OF LEFT  PLEURAL DRAINAGE CATHETER;  Surgeon: Ivin Poot, MD;  Location: [facility];  Service: Thoracic;  Laterality: Left;    has History of diabetes mellitus, type II;  HYPERLIPIDEMIA; History of tobacco use; HYPERTENSION; HEMORRHOIDS; DIVERTICULOSIS, COLON; FATTY LIVER DISEASE; Medicare annual wellness visit, subsequent; Positive hepatitis C antibody test; Systolic murmur; Lower back pain; Polycythemia; BPH (benign prostatic hypertrophy); Right knee pain; Thrombocytopenia, unspecified; Personal history of colonic adenoma; DOE (dyspnea on exertion); Adenocarcinoma of left lung, stage 4; Liver lesion; Recurrent left pleural effusion; New onset atrial fibrillation; Cerumen debris on tympanic membrane; Skin rash; Bronchitis; Other pancytopenia; Hypoalbuminemia; Dehydration; Fatigue; Fever; and Diarrhea on his problem list.     is allergic to candesartan cilexetil; diltiazem hcl; doxazosin mesylate; nifedipine; pravastatin; red yeast rice; rosuvastatin; and simvastatin.    Medication List       This list is accurate as of: 10/04/14  6:25 PM.  Always use your most recent med list.               albuterol 108 (90 BASE) MCG/ACT inhaler  Commonly known as:  PROVENTIL HFA;VENTOLIN HFA  Inhale 2 puffs into the lungs every 6 (six) hours as needed for wheezing or shortness of breath.     amLODipine 5 MG tablet  Commonly known as:  NORVASC  Take 1 tablet (5 mg total) by mouth daily.     apixaban 5 MG Tabs tablet  Commonly known as:  ELIQUIS  Take 1 tablet (5 mg total) by mouth 2 (two) times daily.     atenolol 25 MG tablet  Commonly known as:  TENORMIN  Take 1 tablet (25 mg total) by mouth 2 (two) times daily.     cetirizine 10 MG tablet  Commonly known as:  ZYRTEC  Take 10 mg by mouth daily as needed for allergies or rhinitis.     chlorpheniramine-HYDROcodone 10-8 MG/5ML Lqcr  Commonly known as:  TUSSIONEX  Take 5 mLs by mouth every 12 (twelve) hours as needed for cough.     cholecalciferol 1000 UNITS tablet  Commonly known as:  VITAMIN D  Take 1,000 Units by mouth every morning.     dexamethasone 4 MG tablet  Commonly known as:  DECADRON  4 mg by  mouth twice a day the day before, day of and day after the chemotherapy every 3 weeks.     EYE VITAMINS PO  Take 1 capsule by mouth 2 (two) times daily. Due to macular degeneration     folic acid 1 MG tablet  Commonly known as:  FOLVITE  TAKE 1 TABLET BY MOUTH EVERY DAY     furosemide 20 MG tablet  Commonly known as:  LASIX  Take 1 tablet (20 mg total) by mouth daily.     glimepiride 1 MG tablet  Commonly known as:  AMARYL  Take 1 tablet (1 mg total) by mouth as directed. Take on days you take decadron.     HYDROcodone-acetaminophen 5-325 MG per tablet  Commonly known as:  NORCO/VICODIN  Take 1 tablet by mouth every 6 (six) hours as needed for moderate pain.     ibuprofen 800 MG tablet  Commonly known as:  ADVIL,MOTRIN  Take 800 mg by mouth 2 (two) times daily as needed for moderate pain.     levofloxacin 500 MG tablet  Commonly known as:  LEVAQUIN  Take 1 tablet (500 mg total) by mouth daily.     magic mouthwash w/lidocaine Soln  Take 5 mLs by mouth 3 (three) times daily as needed for mouth pain.     methocarbamol 500 MG tablet  Commonly known as:  ROBAXIN  Take 500 mg by mouth every 8 (eight) hours as needed (pain).     prochlorperazine 10 MG tablet  Commonly known as:  COMPAZINE  Take 1 tablet (10 mg total) by mouth every 6 (six) hours as needed for nausea or vomiting.     sucralfate 1 GM/10ML suspension  Commonly known as:  CARAFATE  Take 1 g by mouth 4 (four) times daily as needed (reflux).         PHYSICAL EXAMINATION  Blood pressure 112/72, pulse 90, temperature 97.9 F (36.6 C), temperature source Oral, resp. rate 18, height 5' 9"  (1.753 m), weight 179 lb 4.8 oz (81.33 kg), SpO2 98.00%.  Physical Exam  LABORATORY DATA:. Appointment on 10/04/2014  Component Date Value Ref Range Status  . WBC 10/04/2014 1.1* 4.0 - 10.3 10e3/uL Final  . NEUT# 10/04/2014 0.4* 1.5 - 6.5 10e3/uL Final  . HGB 10/04/2014 12.0* 13.0 - 17.1 g/dL Final  . HCT 10/04/2014 36.9*  38.4 - 49.9 % Final  . Platelets 10/04/2014 116* 140 - 400 10e3/uL Final  . MCV 10/04/2014 90.8  79.3 - 98.0 fL Final  . MCH 10/04/2014 29.5  27.2 - 33.4 pg Final  . MCHC 10/04/2014 32.5  32.0 - 36.0 g/dL Final  . RBC 10/04/2014 4.06* 4.20 - 5.82 10e6/uL Final  . RDW 10/04/2014 15.0* 11.0 - 14.6 % Final  . lymph# 10/04/2014 0.3* 0.9 - 3.3 10e3/uL Final  . MONO# 10/04/2014 0.3  0.1 - 0.9 10e3/uL Final  . Eosinophils Absolute 10/04/2014 0.0  0.0 - 0.5 10e3/uL Final  . Basophils Absolute 10/04/2014 0.0  0.0 - 0.1 10e3/uL Final  . NEUT% 10/04/2014 40.3  39.0 - 75.0 % Final  . LYMPH% 10/04/2014 29.6  14.0 - 49.0 % Final  . MONO% 10/04/2014 28.2* 0.0 - 14.0 % Final  . EOS% 10/04/2014 1.6  0.0 - 7.0 % Final  . BASO% 10/04/2014 0.3  0.0 - 2.0 % Final  . Sodium 10/04/2014 139  136 - 145 mEq/L Final  . Potassium 10/04/2014 4.7  3.5 - 5.1 mEq/L Final  . Chloride 10/04/2014 102  98 - 109 mEq/L Final  . CO2 10/04/2014 29  22 - 29 mEq/L Final  . Glucose 10/04/2014 272* 70 - 140 mg/dl Final  .  BUN 10/04/2014 19.3  7.0 - 26.0 mg/dL Final  . Creatinine 10/04/2014 0.9  0.7 - 1.3 mg/dL Final  . Total Bilirubin 10/04/2014 1.12  0.20 - 1.20 mg/dL Final  . Alkaline Phosphatase 10/04/2014 62  40 - 150 U/L Final  . AST 10/04/2014 15  5 - 34 U/L Final  . ALT 10/04/2014 15  0 - 55 U/L Final  . Total Protein 10/04/2014 5.9* 6.4 - 8.3 g/dL Final  . Albumin 10/04/2014 2.9* 3.5 - 5.0 g/dL Final  . Calcium 10/04/2014 8.8  8.4 - 10.4 mg/dL Final  . Anion Gap 10/04/2014 9  3 - 11 mEq/L Final  . Glucose 10/04/2014 Negative  Negative mg/dL Final  . Bilirubin (Urine) 10/04/2014 Color Interference  Negative Final  . Ketones 10/04/2014 Negative  Negative mg/dL Final  . Specific Gravity, Urine 10/04/2014 1.020  1.003 - 1.035 Final  . Blood 10/04/2014 Negative  Negative Final  . pH 10/04/2014 6.5  4.6 - 8.0 Final  . Protein 10/04/2014 100  Negative- <30 mg/dL Final  . Urobilinogen, UR 10/04/2014 0.2  0.2 - 1 mg/dL  Final  . Nitrite 10/04/2014 Negative  Negative Final  . Leukocyte Esterase 10/04/2014 Negative  Negative Final  . RBC / HPF 10/04/2014 0-2  0 - 2 Final  . WBC, UA 10/04/2014 0-2  0 - 2 Final  . Bacteria, UA 10/04/2014 Few  Negative- Trace Final  . Casts 10/04/2014 Many Hyaline  Negative Final  . Epithelial Cells 10/04/2014 Few  Negative- Few Final  . Mucus, UA 10/04/2014 Large  Negative- Small Final   Pending blood cultures and urine culture.  RADIOGRAPHIC STUDIES: Dg Chest 2 View  10/04/2014   CLINICAL DATA:  History of lung carcinoma. Increased cough and difficulty breathing  EXAM: CHEST  2 VIEW  COMPARISON:  September 14, 2014  FINDINGS: A PleurX type catheter is present on the left, stable. There is a small left effusion. There is no demonstrable pneumothorax.  There is no edema or consolidation. Heart size and pulmonary vascularity are normal. No adenopathy. No bone lesions. There is degenerative change in the thoracic spine.  IMPRESSION: PleurX type catheter on left, stable. Small left effusion. Elsewhere lungs clear. No pneumothorax. No change in cardiac silhouette.   Electronically Signed   By: Lowella Grip M.D.   On: 10/04/2014 15:03    ASSESSMENT/PLAN:    Adenocarcinoma of left lung, stage 4  Assessment & Plan Patient received cycle 2 of single agent Alimta chemotherapy on 09/27/2014.  He will be due to return for both labs and a followup visit on 10/17/2014; and will receive cycle 3 of the same chemotherapy regimen on 10/18/2014.   Recurrent left pleural effusion  Assessment & Plan Patient continues with his chronic left Pleurx catheter; which she drains every 2 days or so.  He states that draining the catheter does help him feel somewhat less short of breath.  Chest x-ray obtained this afternoon at revealed a stable chest; with only a small left pleural effusion.  Patient has plans to follow back up with Dr. Tharon Aquas Trigt on 10/12/2014.   Bronchitis  Assessment &  Plan Patient had a congested cough and wheezes throughout all lung fields on exam today.  Patient was given an albuterol nebulizer treatment; which did seem to greatly help.  He was given a prescription for Levaquin 500 mg on a daily basis for a total of 7 days for bronchitis.  He also requested and was given a prescription for Tussionex  cough syrup.    Other pancytopenia  Assessment & Plan Chemotherapy-induced pancytopenia noted today with only mild anemia with a hemoglobin of 12.0.  Platelet count slightly decreased at 116.  Patient denies any worsening issues with either easy bleeding or bruising.  However, patient is neutropenic today with an ANC of 0.4.  Reviewed neutropenia guidelines with both patient and his wife again today.   Hypoalbuminemia  Assessment & Plan Albumin was decreased to 2.9.  Encouraged patient to push protein in his diet.   Dehydration  Assessment & Plan Patient did feel dehydrated today.  He received a 1 L normal saline IV fluid rehydration today.  Is also encouraged to push fluids is much as possible.   Fatigue  Assessment & Plan Complain of increased fatigue within the past few days as well.  Patient was encouraged to remain as active as possible.   Fever  Assessment & Plan Patient reports a fever to maximum 100.2 over the weekend.  However, patient was afebrile today on exam.  He denies any chills.  Previously mentioned fever could be related to bronchitis; but will also rule out a C. difficile infection.  Urinalysis obtained today was negative. Patient was prescribed Levaquin 500 mg to take on a daily basis for a total of 7 days.  Pending results of both blood cultures and urine culture.   Diarrhea  Assessment & Plan Patient has been experiencing a to maximum of 2-3 diarrhea episodes per day for the past few days.  Patient was given a stool kit to test for C. difficile.   Patient stated understanding of all instructions; and was in agreement  with this plan of care. The patient knows to call the clinic with any problems, questions or concerns.   Review/collaboration with Dr. [name] regarding all aspects of patient's visit today.   Total time spent with patient was 40 minutes;  with greater than 75 percent of that time spent in face to face counseling regarding his symptoms, and coordination of care and follow up.  Disclaimer: This note was dictated with voice recognition software. Similar sounding words can inadvertently be transcribed and may not be corrected upon review.   Drue Second, NP 10/04/2014

## 2014-10-04 NOTE — Assessment & Plan Note (Signed)
Patient has been experiencing a to maximum of 2-3 diarrhea episodes per day for the past few days.  Patient was given a stool kit to test for C. difficile.

## 2014-10-04 NOTE — Assessment & Plan Note (Signed)
Patient reports a fever to maximum 100.2 over the weekend.  However, patient was afebrile today on exam.  He denies any chills.  Previously mentioned fever could be related to bronchitis; but will also rule out a C. difficile infection.  Urinalysis obtained today was negative. Patient was prescribed Levaquin 500 mg to take on a daily basis for a total of 7 days.  Pending results of both blood cultures and urine culture.

## 2014-10-04 NOTE — Assessment & Plan Note (Signed)
Albumin was decreased to 2.9.  Encouraged patient to push protein in his diet.

## 2014-10-04 NOTE — Assessment & Plan Note (Signed)
Complain of increased fatigue within the past few days as well.  Patient was encouraged to remain as active as possible.

## 2014-10-04 NOTE — Assessment & Plan Note (Signed)
Patient did feel dehydrated today.  He received a 1 L normal saline IV fluid rehydration today.  Is also encouraged to push fluids is much as possible.

## 2014-10-04 NOTE — Assessment & Plan Note (Signed)
Chemotherapy-induced pancytopenia noted today with only mild anemia with a hemoglobin of 12.0.  Platelet count slightly decreased at 116.  Patient denies any worsening issues with either easy bleeding or bruising.  However, patient is neutropenic today with an ANC of 0.4.  Reviewed neutropenia guidelines with both patient and his wife again today.

## 2014-10-05 ENCOUNTER — Ambulatory Visit (INDEPENDENT_AMBULATORY_CARE_PROVIDER_SITE_OTHER): Payer: Medicare Other | Admitting: Ophthalmology

## 2014-10-05 ENCOUNTER — Telehealth: Payer: Self-pay | Admitting: *Deleted

## 2014-10-05 LAB — CLOSTRIDIUM DIFFICILE BY PCR: CDIFFPCR: NEGATIVE

## 2014-10-05 LAB — URINE CULTURE

## 2014-10-05 NOTE — Telephone Encounter (Signed)
Called back and let wife know that C. Diff stool test was negative. Wife appreciative of call.

## 2014-10-05 NOTE — Telephone Encounter (Signed)
Per Shawn Lesser, NP called to check in with patient to see how he is feeling today. Spoke with his wife and she states Shawn Espinoza is napping now. She thinks he is feeling slightly better today. She did bring the stool sample to the cancer center lab earlier this morning. Let her know that his chest x-ray was negative. She will let pt know once he wakes up from his nap. Told Shawn Espinoza that if his stool sample is positive for C. Diff we will call and let them know.

## 2014-10-06 ENCOUNTER — Encounter: Payer: Self-pay | Admitting: *Deleted

## 2014-10-07 ENCOUNTER — Telehealth: Payer: Self-pay | Admitting: *Deleted

## 2014-10-07 ENCOUNTER — Encounter: Payer: Self-pay | Admitting: Nurse Practitioner

## 2014-10-07 ENCOUNTER — Other Ambulatory Visit: Payer: Self-pay

## 2014-10-07 ENCOUNTER — Ambulatory Visit (HOSPITAL_BASED_OUTPATIENT_CLINIC_OR_DEPARTMENT_OTHER): Payer: Medicare Other | Admitting: Nurse Practitioner

## 2014-10-07 ENCOUNTER — Ambulatory Visit (HOSPITAL_COMMUNITY)
Admission: RE | Admit: 2014-10-07 | Discharge: 2014-10-07 | Disposition: A | Discharge status: Home / Self Care | Payer: Medicare Other | Source: Ambulatory Visit | Attending: Nurse Practitioner | Admitting: Nurse Practitioner

## 2014-10-07 ENCOUNTER — Telehealth: Payer: Self-pay | Admitting: Medical Oncology

## 2014-10-07 ENCOUNTER — Encounter (HOSPITAL_COMMUNITY): Payer: Self-pay

## 2014-10-07 VITALS — BP 111/71 | HR 85 | Temp 97.6°F | Resp 18 | Ht 69.0 in | Wt 182.9 lb

## 2014-10-07 DIAGNOSIS — R0602 Shortness of breath: Secondary | ICD-10-CM | POA: Insufficient documentation

## 2014-10-07 DIAGNOSIS — C3492 Malignant neoplasm of unspecified part of left bronchus or lung: Secondary | ICD-10-CM | POA: Diagnosis not present

## 2014-10-07 DIAGNOSIS — J90 Pleural effusion, not elsewhere classified: Secondary | ICD-10-CM

## 2014-10-07 DIAGNOSIS — Z9221 Personal history of antineoplastic chemotherapy: Secondary | ICD-10-CM | POA: Diagnosis not present

## 2014-10-07 DIAGNOSIS — J8489 Other specified interstitial pulmonary diseases: Secondary | ICD-10-CM

## 2014-10-07 DIAGNOSIS — I5030 Unspecified diastolic (congestive) heart failure: Secondary | ICD-10-CM

## 2014-10-07 DIAGNOSIS — C3412 Malignant neoplasm of upper lobe, left bronchus or lung: Secondary | ICD-10-CM

## 2014-10-07 DIAGNOSIS — I509 Heart failure, unspecified: Secondary | ICD-10-CM | POA: Insufficient documentation

## 2014-10-07 MED ORDER — PREDNISONE 10 MG PO TABS: ORAL_TABLET | ORAL | Status: DC

## 2014-10-07 MED ORDER — IOHEXOL 350 MG/ML SOLN
100.0000 mL | Freq: Once | INTRAVENOUS | Status: AC | PRN
  Administered 2014-10-07: 100 mL via INTRAVENOUS

## 2014-10-07 MED ORDER — PREDNISONE 10 MG PO TABS: 10.0000 mg | ORAL_TABLET | Freq: Every day | ORAL | Status: DC

## 2014-10-07 NOTE — Progress Notes (Signed)
SYMPTOM MANAGEMENT CLINIC   HPI: Shawn Espinoza 73 y.o. male diagnosed with lung cancer.  Currently undergoing single agent maintenance Alimta chemotherapy regimen.  [place] today requesting urgent care visit.  He presented earlier this week on Tuesday, 10/04/2014 with complaint of congested cough, occasional wheezing, and some shortness of breath.  Chest x-ray at that time revealed no pneumonia.  Patient was initiated on Levaquin daily.  He was also found to be dehydrated; and received some IV fluid rehydration at that time.  Patient states that his cough has worsened; he is also experiencing significant shortness of breath with any type of exertion whatsoever.  He has a chronic left pleural effusion; and has a left chest Pleurx catheter   drain.  He typically drains approximate 607 100 mL's of pleural fluid every 2 days; but actually drained over 1100 mL's of pleural fluid this morning.  He is also noted some mild bilateral ankle edema in the past few days.  He continues to take the Levaquin as previously directed.  He's had no fever or chills within the past few days.  He denies any GI symptoms whatsoever.  Patient has home O2 that he uses on a as needed basis.  Shortness of Breath    CURRENT THERAPY: Upcoming Treatment Dates - LUNG Pemetrexed (Alimta) q28d Days with orders from any treatment category:  10/18/2014      [facility] ON[facility] SCHEDULING COMMUNICATION      ondansetron (ZOFRAN) IVPB 8 mg      dexamethasone (DECADRON) injection 10 mg      PEMEtrexed (ALIMTA) 1,000 mg in sodium chloride 0.9 % 100 mL chemo infusion      sodium chloride 0.9 % injection 10 mL      heparin lock flush 100 unit/mL      heparin lock flush 100 unit/mL      alteplase (CATHFLO ACTIVASE) injection 2 mg      sodium chloride 0.9 % injection 3 mL      cyanocobalamin ((VITAMIN B-12)) injection 1,000 mcg      0.9 %  sodium chloride infusion      TREATMENT CONDITIONS 11/08/2014      [facility] ONC  SCHEDULING COMMUNICATION      ONDANSETRON IVPB ORDERABLE ([facility])      DEXAMETHASONE INJECTION ORDERABLE [facility]      PEMEtrexed (ALIMTA) 1,000 mg in sodium chloride 0.9 % 100 mL chemo infusion      SODIUM CHLORIDE 0.9 % IJ SOLN      HEPARIN SODIUM LOCK FLUSH 100 UNIT/ML IV SOLN      HEPARIN SODIUM LOCK FLUSH 100 UNIT/ML IV SOLN      ALTEPLASE 2 MG IJ SOLR      SODIUM CHLORIDE 0.9 % IJ SOLN      SODIUM CHLORIDE 0.9 % IV SOLN      TREATMENT CONDITIONS 11/29/2014      [facility] ONC SCHEDULING COMMUNICATION      ONDANSETRON IVPB ORDERABLE ([facility])      DEXAMETHASONE INJECTION ORDERABLE [facility]      PEMEtrexed (ALIMTA) 1,000 mg in sodium chloride 0.9 % 100 mL chemo infusion      SODIUM CHLORIDE 0.9 % IJ SOLN      HEPARIN SODIUM LOCK FLUSH 100 UNIT/ML IV SOLN      HEPARIN SODIUM LOCK FLUSH 100 UNIT/ML IV SOLN      ALTEPLASE 2 MG IJ SOLR      SODIUM CHLORIDE 0.9 % IJ SOLN  SODIUM CHLORIDE 0.9 % IV SOLN      TREATMENT CONDITIONS    Review of Systems  Respiratory: Positive for shortness of breath.     Past Medical History  Diagnosis Date  . History of diabetes mellitus, type II     resolved with diet, h/o neuropathy  . Diverticulosis of colon   . Hyperlipidemia   . Hypertension   . Fatty liver 02/29/00    abd ultrasound  . Lower back pain   . Systolic murmur 4503    2Decho - normal LV fxn, EF 55%, mild AS, biatrial enlargement  . History of tobacco use quit 1990s  . 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 Personal history of colonic adenomas 07/06/2013  . Hypertensive retinopathy of both eyes 2015    mild  . 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 Dysrhythmia     Atrial fib (found 07/2014)  . Shortness of breath   . Pneumonia   . GERD (gastroesophageal reflux disease)     occasional  . Arthritis   . Neuropathy   . Constipation     Past  Surgical History  Procedure Laterality Date  . Colonoscopy  2004  . Colonoscopy  2014    tubular adenoma x1, mod diverticulosis ([place])  . Flexible bronchoscopy  02/2014    WNL  . Video bronchoscopy Bilateral 03/17/2014    Procedure: VIDEO BRONCHOSCOPY WITH FLUORO;  Surgeon: Tanda Rockers, MD;  Location: [facility];  Service: Cardiopulmonary;  Laterality: Bilateral;  . Chest tube insertion Left 08/26/2014    Procedure: INSERTION OF LEFT  PLEURAL DRAINAGE CATHETER;  Surgeon: Ivin Poot, MD;  Location: [facility];  Service: Thoracic;  Laterality: Left;    has History of diabetes mellitus, type II; HYPERLIPIDEMIA; History of tobacco use; HYPERTENSION; HEMORRHOIDS; DIVERTICULOSIS, COLON; FATTY LIVER DISEASE; Medicare annual wellness visit, subsequent; Positive hepatitis C antibody test; Systolic murmur; Lower back pain; Polycythemia; BPH (benign prostatic hypertrophy); Right knee pain; Thrombocytopenia, unspecified; Personal history of colonic adenoma; DOE (dyspnea on exertion); Adenocarcinoma of left lung, stage 4; Liver lesion; Recurrent left pleural effusion; New onset atrial fibrillation; Cerumen debris on tympanic membrane; Skin rash; Bronchitis; Other pancytopenia; Hypoalbuminemia; Dehydration; Fatigue; Fever; Diarrhea; Interstitial pneumonitis; and CHF (congestive heart failure) on his problem list.     is allergic to candesartan cilexetil; diltiazem hcl; doxazosin mesylate; nifedipine; pravastatin; red yeast rice; rosuvastatin; and simvastatin.    Medication List       This list is accurate as of: 10/07/14  5:58 PM.  Always use your most recent med list.               albuterol 108 (90 BASE) MCG/ACT inhaler  Commonly known as:  PROVENTIL HFA;VENTOLIN HFA  Inhale 2 puffs into the lungs every 6 (six) hours as needed for wheezing or shortness of breath.     amLODipine 5 MG tablet  Commonly known as:  NORVASC  Take 1 tablet (5 mg total) by mouth daily.     apixaban 5 MG Tabs  tablet  Commonly known as:  ELIQUIS  Take 1 tablet (5 mg total) by mouth 2 (two) times daily.     atenolol 25 MG tablet  Commonly known as:  TENORMIN  Take 1 tablet (25 mg total) by mouth 2 (two) times daily.  cetirizine 10 MG tablet  Commonly known as:  ZYRTEC  Take 10 mg by mouth daily as needed for allergies or rhinitis.     chlorpheniramine-HYDROcodone 10-8 MG/5ML Lqcr  Commonly known as:  TUSSIONEX  Take 5 mLs by mouth every 12 (twelve) hours as needed for cough.     cholecalciferol 1000 UNITS tablet  Commonly known as:  VITAMIN D  Take 1,000 Units by mouth every morning.     dexamethasone 4 MG tablet  Commonly known as:  DECADRON  4 mg by mouth twice a day the day before, day of and day after the chemotherapy every 3 weeks.     EYE VITAMINS PO  Take 1 capsule by mouth 2 (two) times daily. Due to macular degeneration     folic acid 1 MG tablet  Commonly known as:  FOLVITE  TAKE 1 TABLET BY MOUTH EVERY DAY     furosemide 20 MG tablet  Commonly known as:  LASIX  Take 1 tablet (20 mg total) by mouth daily.     glimepiride 1 MG tablet  Commonly known as:  AMARYL  Take 1 tablet (1 mg total) by mouth as directed. Take on days you take decadron.     HYDROcodone-acetaminophen 5-325 MG per tablet  Commonly known as:  NORCO/VICODIN  Take 1 tablet by mouth every 6 (six) hours as needed for moderate pain.     ibuprofen 800 MG tablet  Commonly known as:  ADVIL,MOTRIN  Take 800 mg by mouth 2 (two) times daily as needed for moderate pain.     levofloxacin 500 MG tablet  Commonly known as:  LEVAQUIN  Take 1 tablet (500 mg total) by mouth daily.     magic mouthwash w/lidocaine Soln  Take 5 mLs by mouth 3 (three) times daily as needed for mouth pain.     methocarbamol 500 MG tablet  Commonly known as:  ROBAXIN  Take 500 mg by mouth every 8 (eight) hours as needed (pain).     predniSONE 10 MG tablet  Commonly known as:  DELTASONE  60 mg QD x 2 weeks; then 40 mg QD x 2  weeks; then 20 mg QD x 1 week; then 10 mg QD till gone.     prochlorperazine 10 MG tablet  Commonly known as:  COMPAZINE  Take 1 tablet (10 mg total) by mouth every 6 (six) hours as needed for nausea or vomiting.     sucralfate 1 GM/10ML suspension  Commonly known as:  CARAFATE  Take 1 g by mouth 4 (four) times daily as needed (reflux).         PHYSICAL EXAMINATION  Blood pressure 111/71, pulse 85, temperature 97.6 F (36.4 C), temperature source Oral, resp. rate 18, height 5\' 9"  (1.753 m), weight 182 lb 14.4 oz (82.963 kg), SpO2 97.00%.  Physical Exam  Nursing note and vitals reviewed. Constitutional: He is oriented to person, place, and time and well-developed, well-nourished, and in no distress.  HENT:  Head: Normocephalic.  Mouth/Throat: Oropharynx is clear and moist.  Eyes: Conjunctivae and EOM are normal. Pupils are equal, round, and reactive to light. No scleral icterus.  Neck: Normal range of motion. Neck supple. No JVD present. No tracheal deviation present. No thyromegaly present.  Cardiovascular: Normal rate, regular rhythm, normal heart sounds and intact distal pulses.   Pulmonary/Chest: No respiratory distress. He has wheezes. He has no rales.  Minimal wheezes noted throughout all lung fields.  However, patient had does have some coarse breath  sounds in his bilateral upper lobes.  Patient has a congested cough.  In no acute respiratory distress; but does appear slightly short of breath during conversation.  Left chest Pleurx drain intact with no evidence of infection. Patient refused O2 while at the [place].  Abdominal: Soft. Bowel sounds are normal. He exhibits no distension and no mass. There is no tenderness. There is no rebound and no guarding.  Musculoskeletal: Normal range of motion. He exhibits edema. He exhibits no tenderness.  +1 bilateral edema to ankles/feet.  Lymphadenopathy:    He has no cervical adenopathy.  Neurological: He is alert and oriented to  person, place, and time. Gait normal.  Skin: Skin is warm and dry. No rash noted. No erythema.  Psychiatric: Affect normal.    LABORATORY DATA:. No visits with results within 3 Day(s) from this visit. Latest known visit with results is:  Appointment on 10/04/2014  Component Date Value Ref Range Status  . WBC 10/04/2014 1.1* 4.0 - 10.3 10e3/uL Final  . NEUT# 10/04/2014 0.4* 1.5 - 6.5 10e3/uL Final  . HGB 10/04/2014 12.0* 13.0 - 17.1 g/dL Final  . HCT 10/04/2014 36.9* 38.4 - 49.9 % Final  . Platelets 10/04/2014 116* 140 - 400 10e3/uL Final  . MCV 10/04/2014 90.8  79.3 - 98.0 fL Final  . MCH 10/04/2014 29.5  27.2 - 33.4 pg Final  . MCHC 10/04/2014 32.5  32.0 - 36.0 g/dL Final  . RBC 10/04/2014 4.06* 4.20 - 5.82 10e6/uL Final  . RDW 10/04/2014 15.0* 11.0 - 14.6 % Final  . lymph# 10/04/2014 0.3* 0.9 - 3.3 10e3/uL Final  . MONO# 10/04/2014 0.3  0.1 - 0.9 10e3/uL Final  . Eosinophils Absolute 10/04/2014 0.0  0.0 - 0.5 10e3/uL Final  . Basophils Absolute 10/04/2014 0.0  0.0 - 0.1 10e3/uL Final  . NEUT% 10/04/2014 40.3  39.0 - 75.0 % Final  . LYMPH% 10/04/2014 29.6  14.0 - 49.0 % Final  . MONO% 10/04/2014 28.2* 0.0 - 14.0 % Final  . EOS% 10/04/2014 1.6  0.0 - 7.0 % Final  . BASO% 10/04/2014 0.3  0.0 - 2.0 % Final  . Sodium 10/04/2014 139  136 - 145 mEq/L Final  . Potassium 10/04/2014 4.7  3.5 - 5.1 mEq/L Final  . Chloride 10/04/2014 102  98 - 109 mEq/L Final  . CO2 10/04/2014 29  22 - 29 mEq/L Final  . Glucose 10/04/2014 272* 70 - 140 mg/dl Final  . BUN 10/04/2014 19.3  7.0 - 26.0 mg/dL Final  . Creatinine 10/04/2014 0.9  0.7 - 1.3 mg/dL Final  . Total Bilirubin 10/04/2014 1.12  0.20 - 1.20 mg/dL Final  . Alkaline Phosphatase 10/04/2014 62  40 - 150 U/L Final  . AST 10/04/2014 15  5 - 34 U/L Final  . ALT 10/04/2014 15  0 - 55 U/L Final  . Total Protein 10/04/2014 5.9* 6.4 - 8.3 g/dL Final  . Albumin 10/04/2014 2.9* 3.5 - 5.0 g/dL Final  . Calcium 10/04/2014 8.8  8.4 - 10.4 mg/dL  Final  . Anion Gap 10/04/2014 9  3 - 11 mEq/L Final  . Glucose 10/04/2014 Negative  Negative mg/dL Final  . Bilirubin (Urine) 10/04/2014 Color Interference  Negative Final  . Ketones 10/04/2014 Negative  Negative mg/dL Final  . Specific Gravity, Urine 10/04/2014 1.020  1.003 - 1.035 Final  . Blood 10/04/2014 Negative  Negative Final  . pH 10/04/2014 6.5  4.6 - 8.0 Final  . Protein 10/04/2014 100  Negative- <30 mg/dL Final  .  Urobilinogen, UR 10/04/2014 0.2  0.2 - 1 mg/dL Final  . Nitrite 10/04/2014 Negative  Negative Final  . Leukocyte Esterase 10/04/2014 Negative  Negative Final  . RBC / HPF 10/04/2014 0-2  0 - 2 Final  . WBC, UA 10/04/2014 0-2  0 - 2 Final  . Bacteria, UA 10/04/2014 Few  Negative- Trace Final  . Casts 10/04/2014 Many Hyaline  Negative Final  . Epithelial Cells 10/04/2014 Few  Negative- Few Final  . Mucus, UA 10/04/2014 Large  Negative- Small Final  . Urine Culture, Routine 10/04/2014 Culture, Urine   Final   Comment: Final - ===== COLONY COUNT: =====                          NO GROWTH                          NO GROWTH  . C difficile by pcr 10/05/2014 Negative  Negative Final   Comment:  This assay detects the presence of Clostridium difficile DNA codingfor toxin B (tcdB) by real-time polymerase chain reaction (PCR)amplification.This test was developed and its performance characteristics have beendetermined by [employer].                           Performance characteristics referto the analytical performance of the test. This test has not beencleared or approved by the [place] Food and Drug Administration. The FDAhas determined that such clearance or approval is not necessary. Thislaboratory is                           certified under the [facility] as qualified to perform high complexity clinicallaboratory testing.     RADIOGRAPHIC STUDIES: Dg Chest 2 View  10/04/2014   CLINICAL DATA:  History of lung carcinoma.  Increased cough and difficulty breathing  EXAM: CHEST  2 VIEW  COMPARISON:  September 14, 2014  FINDINGS: A PleurX type catheter is present on the left, stable. There is a small left effusion. There is no demonstrable pneumothorax.  There is no edema or consolidation. Heart size and pulmonary vascularity are normal. No adenopathy. No bone lesions. There is degenerative change in the thoracic spine.  IMPRESSION: PleurX type catheter on left, stable. Small left effusion. Elsewhere lungs clear. No pneumothorax. No change in cardiac silhouette.   Electronically Signed   By: Lowella Grip M.D.   On: 10/04/2014 15:03   Ct Angio Chest Pe W/cm &/or Wo Cm  10/07/2014   CLINICAL DATA:  Shortness of breath. Lung cancer history. Recent chemotherapy.  EXAM: CT ANGIOGRAPHY CHEST WITH CONTRAST  TECHNIQUE: Multidetector CT imaging of the chest was performed using the standard protocol during bolus administration of intravenous contrast. Multiplanar CT image reconstructions and MIPs were obtained to evaluate the vascular anatomy.  CONTRAST:  185mL OMNIPAQUE IOHEXOL 350 MG/ML SOLN  COMPARISON:  Chest x-ray 10/04/2014.  CT chest 08/13/2014.  FINDINGS: Atherosclerotic vascular calcifications thoracic aorta. No aneurysm or dissection. No pulmonary embolus. Mild cardiomegaly. Coronary artery disease.  Shotty mediastinal and hilar lymph nodes. Thoracic esophagus is thickened. Esophagitis or other esophageal pathology cannot be excluded.  Large airways are patent. Minimal interstitial prominence noted diffusely. Bilateral pleural effusion noted. Congestive heart failure cannot be excluded. Pneumonitis cannot be excluded. Left PleurX catheter stable position. Large left pleural effusion has resolved with minimal residual.  Degenerative changes thoracic spine.  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 Visualized upper abdominal structures are otherwise unremarkable.  Thyroid unremarkable. No significant axillary  adenopathy. Chest wall is unremarkable.  Review of the MIP images confirms the above findings.  IMPRESSION: 1. Left PleurX catheter in stable position. Interim resolution of large left pleural effusion. 2. Cardiomegaly with mild diffuse pulmonary interstitial prominence and small bilateral effusions consistent with mild congestive heart failure. Coronary artery disease. 3. No evidence of pulmonary embolus. 4. 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   Electronically Signed   By: Marcello Moores  Register   On: 10/07/2014 12:39    ASSESSMENT/PLAN:    Adenocarcinoma of left lung, stage 4  Assessment & Plan Patient completed cycle 2 of his single agent Alimta chemotherapy on 09/27/2014.  He is scheduled to return for labs and a followup visit on 10/17/2014; and received cycle 3 of his chemotherapy on 10/18/2014.  Patient was found to be neutropenic earlier this week with his most recent lab draw; and he was once again reminded of all neutropenia precautions.   Recurrent left pleural effusion  Assessment & Plan Patient continues with a chronic left pleural effusion; and has a left Pleurx catheter.  He typically drinks approximately 600-700 mL's of pleural fluid on an every 2 day basis.  Today, he did drain approximately 100 mL's of fluid.  CT angiogram obtained earlier today noted that left pleural effusion was minimal following recent drainage.   Interstitial pneumonitis  Assessment & Plan Patient presented for the second time this week with worsening congested cough, wheezing, and shortness of breath.  He continues to deny any chest pain or chest pressure.  He had previously been given a Levaquin antibiotic prescription on Tuesday, 10/04/2014.  He denies any fevers within the past 24-36 hours.  He states that the Tussionex cough syrup has helped his cough somewhat.  CT angiogram obtained today was negative for pulmonary embolism.  However, it did reveal some mild congestive heart failure; as  well as possible pneumonitis.  It does appear that patient's chronic left pleural effusion is very small on today's scan.  Differential today for patient's presenting symptoms of both progressive dyspnea and cough could actually include interstitial pneumonitis secondary to Alimta.  Will prescribe a very slow taper of prednisone for the patient this afternoon.  Patient will take prednisone 60 mg daily x2 weeks; then prednisone 40 mg daily x2 weeks; then prednisone 20 mg daily x1 week; and then prednisone 10 mg daily x1 week.  Advised patient that he should take prednisone first thing in the morning with food.  Advised the patient that we will be calling him first thing Monday morning to check in on him.  Also advised patient and his wife to call or go directed to the emergency department over the weekend if he develops any new or worsening symptoms whatsoever.   CHF (congestive heart failure)  Assessment & Plan Patient was found to have some mild congestive heart failure on chest CT obtained earlier today.  He will be treated for newly diagnosed pneumonitis as well.  Patient was instructed to initiate Lasix 20 mg every morning.  Patient states he already has Lasix at home that he has been using on an as needed basis in the past.  Advised patient that we will call him first thing on Monday morning; and make a decision regarding the continued Lasix therapy.   Patient stated understanding of all instructions; and was in agreement with  this plan of care. The patient knows to call the clinic with any problems, questions or concerns.   Review/collaboration with Dr. [name]  regarding all aspects of patient's visit today.   Total time spent with patient was  40  minutes;  with greater than  80  percent of that time spent in face to face counseling regarding  his symptoms, review of his scan results,  and coordination of care and follow up.  Disclaimer: This note was dictated with voice recognition  software. Similar sounding words can inadvertently be transcribed and may not be corrected upon review.   Drue Second, NP 10/07/2014

## 2014-10-07 NOTE — Telephone Encounter (Signed)
Wife called -pt is SOB with a bad cough and he chokes sometimes when he coughs. She stated " his antibiotic is not working". She denies fever , he is having night sweats where he had to change shirts.  She drained pleurix of 1000 ml this am . He cannot catch his breath -not all the time. She states cough is not breaking up. He is wearing oxygen 1 liter. Appointment given.

## 2014-10-07 NOTE — Telephone Encounter (Signed)
Received call report from [facility] radiology.  CT scan results printed and gave to [place], NP for symptom management.

## 2014-10-07 NOTE — Assessment & Plan Note (Signed)
Patient continues with a chronic left pleural effusion; and has a left Pleurx catheter.  He typically drinks approximately 600-700 mL's of pleural fluid on an every 2 day basis.  Today, he did drain approximately 100 mL's of fluid.  CT angiogram obtained earlier today noted that left pleural effusion was minimal following recent drainage.

## 2014-10-07 NOTE — Assessment & Plan Note (Signed)
Patient was found to have some mild congestive heart failure on chest CT obtained earlier today.  He will be treated for newly diagnosed pneumonitis as well.  Patient was instructed to initiate Lasix 20 mg every morning.  Patient states he already has Lasix at home that he has been using on an as needed basis in the past.  Advised patient that we will call him first thing on Monday morning; and make a decision regarding the continued Lasix therapy.

## 2014-10-07 NOTE — Assessment & Plan Note (Signed)
Patient presented for the second time this week with worsening congested cough, wheezing, and shortness of breath.  He continues to deny any chest pain or chest pressure.  He had previously been given a Levaquin antibiotic prescription on Tuesday, 10/04/2014.  He denies any fevers within the past 24-36 hours.  He states that the Tussionex cough syrup has helped his cough somewhat.  CT angiogram obtained today was negative for pulmonary embolism.  However, it did reveal some mild congestive heart failure; as well as possible pneumonitis.  It does appear that patient's chronic left pleural effusion is very small on today's scan.  Differential today for patient's presenting symptoms of both progressive dyspnea and cough could actually include interstitial pneumonitis secondary to Alimta.  Will prescribe a very slow taper of prednisone for the patient this afternoon.  Patient will take prednisone 60 mg daily x2 weeks; then prednisone 40 mg daily x2 weeks; then prednisone 20 mg daily x1 week; and then prednisone 10 mg daily x1 week.  Advised patient that he should take prednisone first thing in the morning with food.  Advised the patient that we will be calling him first thing Monday morning to check in on him.  Also advised patient and his wife to call or go directed to the emergency department over the weekend if he develops any new or worsening symptoms whatsoever.

## 2014-10-07 NOTE — Assessment & Plan Note (Signed)
Patient completed cycle 2 of his single agent Alimta chemotherapy on 09/27/2014.  He is scheduled to return for labs and a followup visit on 10/17/2014; and received cycle 3 of his chemotherapy on 10/18/2014.  Patient was found to be neutropenic earlier this week with his most recent lab draw; and he was once again reminded of all neutropenia precautions.

## 2014-10-10 ENCOUNTER — Telehealth: Payer: Self-pay | Admitting: *Deleted

## 2014-10-10 LAB — CULTURE, BLOOD (SINGLE)

## 2014-10-10 NOTE — Telephone Encounter (Signed)
Called pt at home and spoke with wife Kittie for symptom management follow up.  Per Kittie, she thinks pt is feeling a little better.  Kittie stated she drained approx . 1000 ml of fluids today from PleurX catheter.  Instructed Kittie to call office if pt has any new symptoms and/or feeling worse.  Kittie voiced understanding.  Confirmed with Kittie  next office appt for 10/17/14.

## 2014-10-11 ENCOUNTER — Telehealth: Payer: Self-pay | Admitting: *Deleted

## 2014-10-11 NOTE — Telephone Encounter (Signed)
filled and placed in Kim's box. 

## 2014-10-11 NOTE — Telephone Encounter (Signed)
Form for Pleural Effusion care supplies in your IN box for completion.

## 2014-10-12 ENCOUNTER — Ambulatory Visit (INDEPENDENT_AMBULATORY_CARE_PROVIDER_SITE_OTHER): Payer: Medicare Other | Admitting: Cardiothoracic Surgery

## 2014-10-12 ENCOUNTER — Telehealth: Payer: Self-pay | Admitting: *Deleted

## 2014-10-12 ENCOUNTER — Telehealth: Payer: Self-pay | Admitting: Medical Oncology

## 2014-10-12 ENCOUNTER — Encounter: Payer: Self-pay | Admitting: Cardiothoracic Surgery

## 2014-10-12 VITALS — BP 120/79 | HR 77 | Ht 69.0 in | Wt 182.0 lb

## 2014-10-12 DIAGNOSIS — J910 Malignant pleural effusion: Secondary | ICD-10-CM

## 2014-10-12 NOTE — Telephone Encounter (Signed)
Message copied by Tiara Maultsby, [place] on Wed Oct 12, 2014  4:03 PM ------      Message from: Curt Bears      Created: Wed Oct 12, 2014  1:37 PM       No need to take extra doses. He can continue with the taper.      ----- Message -----         From: Ardeen Garland, RN         Sent: 10/12/2014   1:17 PM           To: Genia Plants, RN, Curt Bears, MD            Wife asking if pt should take steroid premed for chemo ? He is currently on week 1 of high dose steroid taper dose over 6 weeks.             Call her and let her know?             ------

## 2014-10-12 NOTE — Telephone Encounter (Signed)
Wife asking if pt should take steroid premed for chemo 10/18/14 ? He is currently on week 1 of high dose steroid taper dose over 6 weeks. Note to [place].

## 2014-10-12 NOTE — Telephone Encounter (Signed)
Form faxed

## 2014-10-12 NOTE — Progress Notes (Signed)
PCP is [name], MD Referring Provider is Curt Bears, MD  Chief Complaint  Patient presents with  . 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 Drainage has been approximately 600 cc every other day Monday through Friday. Patient is receiving chemotherapy and CT scan last week shows excellent response with minimal neoplastic involvement in the thorax. No significant pleural effusion on x-ray. Patient's feeling better on a prednisone taper started for cough and shortness of breath. He is also taking Lasix daily for ankle edema. 2-D echocardiogram performed earlier this summer shows mild-moderate aortic stenosis good LV function. No significant pericardial effusion.  Patient functionally is doing well now. He is scheduled have chemotherapy next week however his last white count was 1.3.  The patient has inquired about getting a Port-A-Cath. I would be happy to place a Port-A-Cath if procedure is felt to be indicated by his oncologist.  Past Medical History  Diagnosis Date  . History of diabetes mellitus, type II     resolved with diet, h/o neuropathy  . Diverticulosis of colon   . Hyperlipidemia   . Hypertension   . Fatty liver 02/29/00    abd ultrasound  . Lower back pain   . Systolic murmur 7829    2Decho - normal LV fxn, EF 55%, mild AS, biatrial enlargement  . History of tobacco use quit 1990s  . 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 Personal history of colonic adenomas 07/06/2013  . Hypertensive retinopathy of both eyes 2015    mild  . 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 Dysrhythmia     Atrial fib (found 07/2014)  . Shortness of breath   . Pneumonia   . GERD (gastroesophageal reflux disease)     occasional  . Arthritis   . Neuropathy   .  Constipation     Past Surgical History  Procedure Laterality Date  . Colonoscopy  2004  . Colonoscopy  2014    tubular adenoma x1, mod diverticulosis (Gessner)  . Flexible bronchoscopy  02/2014    WNL  . Video bronchoscopy Bilateral 03/17/2014    Procedure: VIDEO BRONCHOSCOPY WITH FLUORO;  Surgeon: Tanda Rockers, MD;  Location: [facility];  Service: Cardiopulmonary;  Laterality: Bilateral;  . Chest tube insertion Left 08/26/2014    Procedure: INSERTION OF LEFT  PLEURAL DRAINAGE CATHETER;  Surgeon: Ivin Poot, MD;  Location: [facility] OR;  Service: Thoracic;  Laterality: Left;    Family History  Problem Relation Age of Onset  . Stroke Mother     multiple mini strokes  . Hypertension Mother   . Heart disease Maternal Grandfather     MI  . Stroke Paternal Grandmother   . Colon cancer Neg Hx     Social History History  Substance Use Topics  . Smoking status: Former Smoker -- 3.00 packs/day for 30 years    Types: Cigarettes    Quit date: 12/23/1988  . Smokeless tobacco: Never Used  . Alcohol Use: 12.6 oz/week    21 Glasses of wine per week     Comment: 3-4 wine a day ( 8/03- 2 beers, 2 wines, 2 brandies daily)    Current Outpatient Prescriptions  Medication Sig Dispense Refill  . albuterol (PROVENTIL  HFA;VENTOLIN HFA) 108 (90 BASE) MCG/ACT inhaler Inhale 2 puffs into the lungs every 6 (six) hours as needed for wheezing or shortness of breath.  1 Inhaler  2  . Alum & Mag Hydroxide-Simeth (MAGIC MOUTHWASH W/LIDOCAINE) SOLN Take 5 mLs by mouth 3 (three) times daily as needed for mouth pain.  240 mL  0  . amLODipine (NORVASC) 5 MG tablet Take 1 tablet (5 mg total) by mouth daily.  90 tablet  2  . apixaban (ELIQUIS) 5 MG TABS tablet Take 1 tablet (5 mg total) by mouth 2 (two) times daily.  60 tablet  1  . atenolol (TENORMIN) 25 MG tablet Take 1 tablet (25 mg total) by mouth 2 (two) times daily.  180 tablet  2  . cetirizine (ZYRTEC) 10 MG tablet Take 10 mg by mouth daily as needed for  allergies or rhinitis.       . chlorpheniramine-HYDROcodone (TUSSIONEX) 10-8 MG/5ML LQCR Take 5 mLs by mouth every 12 (twelve) hours as needed for cough.  115 mL  0  . cholecalciferol (VITAMIN D) 1000 UNITS tablet Take 1,000 Units by mouth every morning.       Marland Kitchen dexamethasone (DECADRON) 4 MG tablet 4 mg by mouth twice a day the day before, day of and day after the chemotherapy every 3 weeks.  40 tablet  1  . folic acid (FOLVITE) 1 MG tablet TAKE 1 TABLET BY MOUTH EVERY DAY  30 tablet  4  . furosemide (LASIX) 20 MG tablet Take 1 tablet (20 mg total) by mouth daily.  90 tablet  1  . glimepiride (AMARYL) 1 MG tablet Take 1 tablet (1 mg total) by mouth as directed. Take on days you take decadron.  30 tablet  0  . levofloxacin (LEVAQUIN) 500 MG tablet Take 1 tablet (500 mg total) by mouth daily.  7 tablet  0  . methocarbamol (ROBAXIN) 500 MG tablet Take 500 mg by mouth every 8 (eight) hours as needed (pain).       . Multiple Vitamins-Minerals (EYE VITAMINS PO) Take 1 capsule by mouth 2 (two) times daily. Due to macular degeneration      . predniSONE (DELTASONE) 10 MG tablet 60 mg QD x 2 weeks; then 40 mg QD x 2 weeks; then 20 mg QD x 1 week; then 10 mg QD till gone.  161 tablet  0  . sucralfate (CARAFATE) 1 GM/10ML suspension Take 1 g by mouth 4 (four) times daily as needed (reflux).      Marland Kitchen HYDROcodone-acetaminophen (NORCO/VICODIN) 5-325 MG per tablet Take 1 tablet by mouth every 6 (six) hours as needed for moderate pain.      Marland Kitchen ibuprofen (ADVIL,MOTRIN) 800 MG tablet Take 800 mg by mouth 2 (two) times daily as needed for moderate pain.      Marland Kitchen prochlorperazine (COMPAZINE) 10 MG tablet Take 1 tablet (10 mg total) by mouth every 6 (six) hours as needed for nausea or vomiting.  60 tablet  0   No current facility-administered medications for this visit.    Allergies  Allergen Reactions  . Candesartan Cilexetil     REACTION: Muscle spasms  . Diltiazem Hcl     REACTION: Dizziness  . Doxazosin Mesylate  Other (See Comments)    REACTION: H/A's  . Nifedipine     REACTION: Leg swelling  . Pravastatin Other (See Comments)    Muscle aches  . Red Yeast Rice     Muscle aches  . Rosuvastatin Other (See Comments)  REACTION: questionable: severe constipation  . Simvastatin Other (See Comments)    REACTION: Muscle aches    Review of Systems weight has been stable. Overall strength and energy are improved on prednisone. Cough is definitely better.  BP 120/79  Pulse 77  Ht 5\' 9"  (1.753 m)  Wt 182 lb (82.555 kg)  BMI 26.86 kg/m2  SpO2 96% Physical Exam Alert and comfortable Lungs clear Pleurx catheter dressing dry and intact Heart rate regular No ankle edema  Diagnostic Tests: Last chest x-ray-CT scan reviewed showed no significant pleural effusion, Pleurx catheter in good position  Impression: Good response the Pleurx catheter. Continue current drainage schedule. Report any leakage around the catheter site or evidence of infection.  Plan: Return for routine followup in 3-4 weeks. A Port-A-Cath could be scheduled if felt to be indicated by the oncologist.

## 2014-10-12 NOTE — Telephone Encounter (Signed)
I left message about this on pt voice mail.

## 2014-10-12 NOTE — Telephone Encounter (Signed)
Message copied by Ardeen Garland on Wed Oct 12, 2014  4:36 PM ------      Message from: Curt Bears      Created: Wed Oct 12, 2014  1:37 PM       No need to take extra doses. He can continue with the taper.      ----- Message -----         From: Ardeen Garland, RN         Sent: 10/12/2014   1:17 PM           To: Genia Plants, RN, Curt Bears, MD            Wife asking if pt should take steroid premed for chemo ? He is currently on week 1 of high dose steroid taper dose over 6 weeks.             Call her and let her know?             ------

## 2014-10-12 NOTE — Telephone Encounter (Signed)
Called and left a message with instructions.

## 2014-10-14 ENCOUNTER — Other Ambulatory Visit: Payer: Self-pay | Admitting: Internal Medicine

## 2014-10-17 ENCOUNTER — Encounter: Payer: Self-pay | Admitting: Oncology

## 2014-10-17 ENCOUNTER — Other Ambulatory Visit (HOSPITAL_BASED_OUTPATIENT_CLINIC_OR_DEPARTMENT_OTHER): Payer: Medicare Other

## 2014-10-17 ENCOUNTER — Telehealth: Payer: Self-pay | Admitting: Oncology

## 2014-10-17 ENCOUNTER — Telehealth: Payer: Self-pay | Admitting: *Deleted

## 2014-10-17 ENCOUNTER — Ambulatory Visit (HOSPITAL_BASED_OUTPATIENT_CLINIC_OR_DEPARTMENT_OTHER): Payer: Medicare Other | Admitting: Oncology

## 2014-10-17 VITALS — BP 139/88 | HR 64 | Temp 97.5°F | Resp 18 | Ht 69.0 in | Wt 175.8 lb

## 2014-10-17 DIAGNOSIS — C3411 Malignant neoplasm of upper lobe, right bronchus or lung: Secondary | ICD-10-CM

## 2014-10-17 DIAGNOSIS — J918 Pleural effusion in other conditions classified elsewhere: Secondary | ICD-10-CM

## 2014-10-17 DIAGNOSIS — I4891 Unspecified atrial fibrillation: Secondary | ICD-10-CM

## 2014-10-17 DIAGNOSIS — C3490 Malignant neoplasm of unspecified part of unspecified bronchus or lung: Secondary | ICD-10-CM

## 2014-10-17 DIAGNOSIS — C3492 Malignant neoplasm of unspecified part of left bronchus or lung: Secondary | ICD-10-CM

## 2014-10-17 LAB — COMPREHENSIVE METABOLIC PANEL (CC13)
ALK PHOS: 80 U/L (ref 40–150)
ALT: 27 U/L (ref 0–55)
AST: 19 U/L (ref 5–34)
Albumin: 3.6 g/dL (ref 3.5–5.0)
Anion Gap: 12 mEq/L — ABNORMAL HIGH (ref 3–11)
BILIRUBIN TOTAL: 0.6 mg/dL (ref 0.20–1.20)
BUN: 25.3 mg/dL (ref 7.0–26.0)
CHLORIDE: 96 meq/L — AB (ref 98–109)
CO2: 27 mEq/L (ref 22–29)
CREATININE: 1.1 mg/dL (ref 0.7–1.3)
Calcium: 9.1 mg/dL (ref 8.4–10.4)
Glucose: 356 mg/dl — ABNORMAL HIGH (ref 70–140)
Potassium: 4.1 mEq/L (ref 3.5–5.1)
Sodium: 135 mEq/L — ABNORMAL LOW (ref 136–145)
Total Protein: 6.4 g/dL (ref 6.4–8.3)

## 2014-10-17 LAB — CBC WITH DIFFERENTIAL/PLATELET
BASO%: 0.1 % (ref 0.0–2.0)
BASOS ABS: 0 10*3/uL (ref 0.0–0.1)
EOS%: 0.2 % (ref 0.0–7.0)
Eosinophils Absolute: 0 10*3/uL (ref 0.0–0.5)
HEMATOCRIT: 44 % (ref 38.4–49.9)
HGB: 14.3 g/dL (ref 13.0–17.1)
LYMPH#: 0.6 10*3/uL — AB (ref 0.9–3.3)
LYMPH%: 5.3 % — ABNORMAL LOW (ref 14.0–49.0)
MCH: 29.7 pg (ref 27.2–33.4)
MCHC: 32.5 g/dL (ref 32.0–36.0)
MCV: 91.5 fL (ref 79.3–98.0)
MONO#: 0.3 10*3/uL (ref 0.1–0.9)
MONO%: 2.7 % (ref 0.0–14.0)
NEUT#: 9.7 10*3/uL — ABNORMAL HIGH (ref 1.5–6.5)
NEUT%: 91.7 % — ABNORMAL HIGH (ref 39.0–75.0)
Platelets: 307 10*3/uL (ref 140–400)
RBC: 4.81 10*6/uL (ref 4.20–5.82)
RDW: 15.8 % — ABNORMAL HIGH (ref 11.0–14.6)
WBC: 10.5 10*3/uL — ABNORMAL HIGH (ref 4.0–10.3)

## 2014-10-17 MED ORDER — HYDROCOD POLST-CHLORPHEN POLST 10-8 MG/5ML PO LQCR: 5.0000 mL | Freq: Two times a day (BID) | ORAL | Status: DC | PRN

## 2014-10-17 NOTE — Progress Notes (Signed)
Shawn Espinoza [phone]   [phone] [place], [place] [phone]  DIAGNOSIS: Stage IIIB/IV non-small cell lung cancer, adenocarcinoma diagnosed in March of 2015.  PRIOR THERAPY: Systemic chemotherapy with carboplatin for AUC of 5 and Alimta 500 mg/M2 every 3 weeks. First dose expected on 04/13/2014. Status post 6 cycles..  CURRENT THERAPY: maintenance chemotherapy with single agent Alimta 500 mg/M2 every 3 weeks. First cycle on 09/06/2014. Status post 2 cycles.  INTERVAL HISTORY: Shawn Espinoza 73 y.o. male returns to the clinic today for follow up visit. He is feeling fine and continues to tolerate his chemotherapy with single agent Alimta fairly well with no significant adverse effects. He also continues to drain around 325-350 mL of pleural fluid from the Pleuryx catheter every 2 days; this amount has slowly decreased He denied having any significant chest pain but continues to have shortness breath and mild cough with no hemoptysis. He has no significant fever or chills, no nausea or vomiting. He is here today to start cycle #3of the maintenance chemotherapy.  MEDICAL HISTORY: Past Medical History  Diagnosis Date  . History of diabetes mellitus, type II     resolved with diet, h/o neuropathy  . Diverticulosis of colon   . Hyperlipidemia   . Hypertension   . Fatty liver 02/29/00    abd ultrasound  . Lower back pain   . Systolic murmur 4034    2Decho - normal LV fxn, EF 55%, mild AS, biatrial enlargement  . History of tobacco use quit 1990s  . 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 Personal history of colonic adenomas 07/06/2013  . Hypertensive retinopathy of both eyes 2015    mild  . 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  Dysrhythmia     Atrial fib (found 07/2014)  . Shortness of breath   . Pneumonia   . GERD (gastroesophageal reflux disease)     occasional  . Arthritis   . Neuropathy   . Constipation     ALLERGIES:  is allergic to candesartan cilexetil; diltiazem hcl; doxazosin mesylate; nifedipine; pravastatin; red yeast rice; rosuvastatin; and simvastatin.  MEDICATIONS:  Current Outpatient Prescriptions  Medication Sig Dispense Refill  . albuterol (PROVENTIL HFA;VENTOLIN HFA) 108 (90 BASE) MCG/ACT inhaler Inhale 2 puffs into the lungs every 6 (six) hours as needed for wheezing or shortness of breath.  1 Inhaler  2  . Alum & Mag Hydroxide-Simeth (MAGIC MOUTHWASH W/LIDOCAINE) SOLN Take 5 mLs by mouth 3 (three) times daily as needed for mouth pain.  240 mL  0  . amLODipine (NORVASC) 5 MG tablet Take 1 tablet (5 mg total) by mouth daily.  90 tablet  2  . apixaban (ELIQUIS) 5 MG TABS tablet Take 1 tablet (5 mg total) by mouth 2 (two) times daily.  60 tablet  1  . atenolol (TENORMIN) 25 MG tablet Take 1 tablet (25 mg total) by mouth 2 (two) times daily.  180 tablet  2  . cephALEXin (KEFLEX) 500 MG capsule       . cetirizine (ZYRTEC) 10 MG tablet Take 10 mg by mouth daily as needed for allergies or rhinitis.       . chlorpheniramine-HYDROcodone (TUSSIONEX) 10-8 MG/5ML LQCR Take  5 mLs by mouth every 12 (twelve) hours as needed for cough.  115 mL  0  . cholecalciferol (VITAMIN D) 1000 UNITS tablet Take 1,000 Units by mouth every morning.       Marland Kitchen dexamethasone (DECADRON) 4 MG tablet 4 mg by mouth twice a day the day before, day of and day after the chemotherapy every 3 weeks.  40 tablet  1  . folic acid (FOLVITE) 1 MG tablet TAKE 1 TABLET BY MOUTH EVERY DAY  30 tablet  4  . furosemide (LASIX) 20 MG tablet Take 1 tablet (20 mg total) by mouth daily.  90 tablet  1  . glimepiride (AMARYL) 1 MG tablet Take 1 tablet (1 mg total) by mouth as directed. Take on days you take decadron.  30 tablet  0  .  HYDROcodone-acetaminophen (NORCO/VICODIN) 5-325 MG per tablet Take 1 tablet by mouth every 6 (six) hours as needed for moderate pain.      Marland Kitchen ibuprofen (ADVIL,MOTRIN) 800 MG tablet Take 800 mg by mouth 2 (two) times daily as needed for moderate pain.      Marland Kitchen levofloxacin (LEVAQUIN) 500 MG tablet Take 1 tablet (500 mg total) by mouth daily.  7 tablet  0  . methocarbamol (ROBAXIN) 500 MG tablet Take 500 mg by mouth every 8 (eight) hours as needed (pain).       . Multiple Vitamins-Minerals (EYE VITAMINS PO) Take 1 capsule by mouth 2 (two) times daily. Due to macular degeneration      . predniSONE (DELTASONE) 10 MG tablet 60 mg QD x 2 weeks; then 40 mg QD x 2 weeks; then 20 mg QD x 1 week; then 10 mg QD till gone.  161 tablet  0  . prochlorperazine (COMPAZINE) 10 MG tablet Take 1 tablet (10 mg total) by mouth every 6 (six) hours as needed for nausea or vomiting.  60 tablet  0  . sucralfate (CARAFATE) 1 GM/10ML suspension Take 1 g by mouth 4 (four) times daily as needed (reflux).       No current facility-administered medications for this visit.    SURGICAL HISTORY:  Past Surgical History  Procedure Laterality Date  . Colonoscopy  2004  . Colonoscopy  2014    tubular adenoma x1, mod diverticulosis ([facility])  . Flexible bronchoscopy  02/2014    WNL  . Video bronchoscopy Bilateral 03/17/2014    Procedure: VIDEO BRONCHOSCOPY WITH FLUORO;  Surgeon: Tanda Rockers, MD;  Location: [facility];  Service: Cardiopulmonary;  Laterality: Bilateral;  . Chest tube insertion Left 08/26/2014    Procedure: INSERTION OF LEFT  PLEURAL DRAINAGE CATHETER;  Surgeon: Ivin Poot, MD;  Location: [facility];  Service: Thoracic;  Laterality: Left;    REVIEW OF SYSTEMS:  Constitutional: positive for fatigue Eyes: negative Ears, nose, mouth, throat, and face: negative Respiratory: positive for cough and dyspnea on exertion Cardiovascular: negative Gastrointestinal: negative Genitourinary:negative Integument/breast:  negative Hematologic/lymphatic: negative Musculoskeletal:negative Neurological: negative Behavioral/Psych: negative Endocrine: negative Allergic/Immunologic: negative   PHYSICAL EXAMINATION: General appearance: alert, cooperative, fatigued and no distress Head: Normocephalic, without obvious abnormality, atraumatic Neck: no adenopathy, no JVD, supple, symmetrical, trachea midline and thyroid not enlarged, symmetric, no tenderness/mass/nodules Lymph nodes: Cervical, supraclavicular, and axillary nodes normal. Resp: diminished breath sounds LLL and dullness to percussion LLL Back: symmetric, no curvature. ROM normal. No CVA tenderness. Cardio: regular rate and rhythm, S1, S2 normal, no murmur, click, rub or gallop GI: soft, non-tender; bowel sounds normal; no masses,  no organomegaly Extremities: extremities  normal, atraumatic, no cyanosis or edema Neurologic: Alert and oriented X 3, normal strength and tone. Normal symmetric reflexes. Normal coordination and gait  ECOG PERFORMANCE STATUS: 1 - Symptomatic but completely ambulatory  Blood pressure 139/88, pulse 64, temperature 97.5 F (36.4 C), temperature source Oral, resp. rate 18, height 5\' 9"  (1.753 m), weight 175 lb 12.8 oz (79.742 kg), SpO2 100.00%.  LABORATORY DATA: Lab Results  Component Value Date   WBC 10.5* 10/17/2014   HGB 14.3 10/17/2014   HCT 44.0 10/17/2014   MCV 91.5 10/17/2014   PLT 307 10/17/2014      Chemistry      Component Value Date/Time   NA 135* 10/17/2014 1257   NA 140 08/26/2014 1246   K 4.1 10/17/2014 1257   K 4.5 08/26/2014 1246   CL 100 08/26/2014 1246   CO2 27 10/17/2014 1257   CO2 29 08/26/2014 1246   BUN 25.3 10/17/2014 1257   BUN 16 08/26/2014 1246   CREATININE 1.1 10/17/2014 1257   CREATININE 0.76 08/26/2014 1246   CREATININE 0.83 02/22/2014 1314      Component Value Date/Time   CALCIUM 9.1 10/17/2014 1257   CALCIUM 8.7 08/26/2014 1246   ALKPHOS 80 10/17/2014 1257   ALKPHOS 68 08/13/2014 1222   AST  19 10/17/2014 1257   AST 21 08/13/2014 1222   ALT 27 10/17/2014 1257   ALT 15 08/13/2014 1222   BILITOT 0.60 10/17/2014 1257   BILITOT 1.3* 08/13/2014 1222       RADIOGRAPHIC STUDIES:  ASSESSMENT AND PLAN: this is a very pleasant 73 years old white male with:  1) Stage IV non-small cell lung cancer, adenocarcinoma presenting with large right upper lobe lung mass in addition to mediastinal and bilateral hilar lymphadenopathy as well as cervical lymphadenopathy and left pleural effusion. The patient completed a course systemic chemotherapy with carboplatin and Alimta status post 6 cycles. He is tolerating his treatment fairly well except for fatigue secondary to chemotherapy-induced anemia. He is currently undergoing maintenance chemotherapy with single agent Alimta and tolerating it well. We will proceed with cycle #3  Tomorrow as scheduled. I have requested a restaging CT scan prior to his fourth cycle of chemotherapy.  2) Recurrent left pleural effusion: He is status post Pleurx catheter placement. Continue drainage of the pleural fluid 2-3 times a week. He has a followup appointment is scheduled with Dr. Tharon Aquas Trigt in November.  3) Atrial fibrillation: continue Aliquis.  The patient would come back for followup visit in 3 weeks with the start of the next cycle of his chemotherapy for evaluation and management any adverse effect of his treatment.  He was advised to call immediately if he has any concerning symptoms in the interval.  The patient voices understanding of current disease status and treatment options and is in agreement with the current care plan.  All questions were answered. The patient knows to call the clinic with any problems, questions or concerns. We can certainly see the patient much sooner if necessary.  Mikey Bussing, DNP, AGPCNP-BC

## 2014-10-17 NOTE — Telephone Encounter (Signed)
Pt confirmed labs/ov per 10/26 POF, sent msg to add chemo, gave pt AVS.... KJ

## 2014-10-17 NOTE — Telephone Encounter (Signed)
Per staff message and POF I have scheduled appts. Advised scheduler of appts. JMW  

## 2014-10-18 ENCOUNTER — Other Ambulatory Visit: Payer: Medicare Other

## 2014-10-18 ENCOUNTER — Ambulatory Visit (HOSPITAL_BASED_OUTPATIENT_CLINIC_OR_DEPARTMENT_OTHER): Payer: Medicare Other

## 2014-10-18 DIAGNOSIS — Z5111 Encounter for antineoplastic chemotherapy: Secondary | ICD-10-CM

## 2014-10-18 DIAGNOSIS — C3492 Malignant neoplasm of unspecified part of left bronchus or lung: Secondary | ICD-10-CM

## 2014-10-18 DIAGNOSIS — C3411 Malignant neoplasm of upper lobe, right bronchus or lung: Secondary | ICD-10-CM

## 2014-10-18 MED ORDER — ONDANSETRON 8 MG/50ML IVPB (CHCC)
8.0000 mg | Freq: Once | INTRAVENOUS | Status: AC
  Administered 2014-10-18: 8 mg via INTRAVENOUS

## 2014-10-18 MED ORDER — CYANOCOBALAMIN 1000 MCG/ML IJ SOLN
INTRAMUSCULAR | Status: AC
  Filled 2014-10-18: qty 1

## 2014-10-18 MED ORDER — CYANOCOBALAMIN 1000 MCG/ML IJ SOLN
1000.0000 ug | Freq: Once | INTRAMUSCULAR | Status: AC
  Administered 2014-10-18: 1000 ug via INTRAMUSCULAR

## 2014-10-18 MED ORDER — SODIUM CHLORIDE 0.9 % IV SOLN
500.0000 mg/m2 | Freq: Once | INTRAVENOUS | Status: AC
  Administered 2014-10-18: 1000 mg via INTRAVENOUS
  Filled 2014-10-18: qty 40

## 2014-10-18 MED ORDER — DEXAMETHASONE SODIUM PHOSPHATE 10 MG/ML IJ SOLN
10.0000 mg | Freq: Once | INTRAMUSCULAR | Status: AC
  Administered 2014-10-18: 10 mg via INTRAVENOUS

## 2014-10-18 MED ORDER — ONDANSETRON 8 MG/NS 50 ML IVPB
INTRAVENOUS | Status: AC
  Filled 2014-10-18: qty 8

## 2014-10-18 MED ORDER — SODIUM CHLORIDE 0.9 % IV SOLN
Freq: Once | INTRAVENOUS | Status: AC
  Administered 2014-10-18: 13:00:00 via INTRAVENOUS

## 2014-10-18 MED ORDER — DEXAMETHASONE SODIUM PHOSPHATE 10 MG/ML IJ SOLN
INTRAMUSCULAR | Status: AC
  Filled 2014-10-18: qty 1

## 2014-10-18 NOTE — Patient Instructions (Signed)
[facility] Discharge Instructions for Patients Receiving Chemotherapy  Today you received the following chemotherapy agents: Alimta.  To help prevent nausea and vomiting after your treatment, we encourage you to take your nausea medication: As directed.   If you develop nausea and vomiting that is not controlled by your nausea medication, call the clinic.   BELOW ARE SYMPTOMS THAT SHOULD BE REPORTED IMMEDIATELY:  *FEVER GREATER THAN 100.5 F  *CHILLS WITH OR WITHOUT FEVER  NAUSEA AND VOMITING THAT IS NOT CONTROLLED WITH YOUR NAUSEA MEDICATION  *UNUSUAL SHORTNESS OF BREATH  *UNUSUAL BRUISING OR BLEEDING  TENDERNESS IN MOUTH AND THROAT WITH OR WITHOUT PRESENCE OF ULCERS  *URINARY PROBLEMS  *BOWEL PROBLEMS  UNUSUAL RASH Items with * indicate a potential emergency and should be followed up as soon as possible.  Feel free to call the clinic you have any questions or concerns. The clinic phone number is [phone].

## 2014-11-06 ENCOUNTER — Other Ambulatory Visit: Payer: Self-pay | Admitting: Family Medicine

## 2014-11-07 ENCOUNTER — Telehealth: Payer: Self-pay | Admitting: *Deleted

## 2014-11-07 ENCOUNTER — Ambulatory Visit (HOSPITAL_COMMUNITY)
Admission: RE | Admit: 2014-11-07 | Discharge: 2014-11-07 | Disposition: A | Discharge status: Home / Self Care | Payer: Medicare Other | Source: Ambulatory Visit | Attending: Oncology | Admitting: Oncology

## 2014-11-07 ENCOUNTER — Encounter (HOSPITAL_COMMUNITY): Payer: Self-pay

## 2014-11-07 ENCOUNTER — Inpatient Hospital Stay: Payer: Medicare Other

## 2014-11-07 ENCOUNTER — Other Ambulatory Visit (HOSPITAL_BASED_OUTPATIENT_CLINIC_OR_DEPARTMENT_OTHER): Payer: Medicare Other

## 2014-11-07 DIAGNOSIS — C3411 Malignant neoplasm of upper lobe, right bronchus or lung: Secondary | ICD-10-CM

## 2014-11-07 DIAGNOSIS — C3492 Malignant neoplasm of unspecified part of left bronchus or lung: Secondary | ICD-10-CM

## 2014-11-07 DIAGNOSIS — I119 Hypertensive heart disease without heart failure: Secondary | ICD-10-CM | POA: Diagnosis not present

## 2014-11-07 DIAGNOSIS — C3490 Malignant neoplasm of unspecified part of unspecified bronchus or lung: Secondary | ICD-10-CM | POA: Insufficient documentation

## 2014-11-07 DIAGNOSIS — J90 Pleural effusion, not elsewhere classified: Secondary | ICD-10-CM | POA: Insufficient documentation

## 2014-11-07 DIAGNOSIS — J432 Centrilobular emphysema: Secondary | ICD-10-CM | POA: Diagnosis not present

## 2014-11-07 DIAGNOSIS — K5730 Diverticulosis of large intestine without perforation or abscess without bleeding: Secondary | ICD-10-CM | POA: Insufficient documentation

## 2014-11-07 DIAGNOSIS — C787 Secondary malignant neoplasm of liver and intrahepatic bile duct: Secondary | ICD-10-CM | POA: Insufficient documentation

## 2014-11-07 DIAGNOSIS — I7772 Dissection of iliac artery: Secondary | ICD-10-CM | POA: Diagnosis not present

## 2014-11-07 DIAGNOSIS — B1920 Unspecified viral hepatitis C without hepatic coma: Secondary | ICD-10-CM | POA: Diagnosis not present

## 2014-11-07 DIAGNOSIS — I700 Atherosclerosis of aorta: Secondary | ICD-10-CM | POA: Insufficient documentation

## 2014-11-07 LAB — CBC WITH DIFFERENTIAL/PLATELET
BASO%: 0.3 % (ref 0.0–2.0)
Basophils Absolute: 0 10*3/uL (ref 0.0–0.1)
EOS%: 1.5 % (ref 0.0–7.0)
Eosinophils Absolute: 0.1 10*3/uL (ref 0.0–0.5)
HCT: 41.1 % (ref 38.4–49.9)
HGB: 13.3 g/dL (ref 13.0–17.1)
LYMPH%: 13.3 % — ABNORMAL LOW (ref 14.0–49.0)
MCH: 29.8 pg (ref 27.2–33.4)
MCHC: 32.4 g/dL (ref 32.0–36.0)
MCV: 92.2 fL (ref 79.3–98.0)
MONO#: 0.6 10*3/uL (ref 0.1–0.9)
MONO%: 10.1 % (ref 0.0–14.0)
NEUT#: 4.4 10*3/uL (ref 1.5–6.5)
NEUT%: 74.8 % (ref 39.0–75.0)
Platelets: 156 10*3/uL (ref 140–400)
RBC: 4.46 10*6/uL (ref 4.20–5.82)
RDW: 17.1 % — AB (ref 11.0–14.6)
WBC: 5.9 10*3/uL (ref 4.0–10.3)
lymph#: 0.8 10*3/uL — ABNORMAL LOW (ref 0.9–3.3)

## 2014-11-07 LAB — COMPREHENSIVE METABOLIC PANEL (CC13)
ALT: 40 U/L (ref 0–55)
AST: 30 U/L (ref 5–34)
Albumin: 3.5 g/dL (ref 3.5–5.0)
Alkaline Phosphatase: 68 U/L (ref 40–150)
Anion Gap: 10 mEq/L (ref 3–11)
BUN: 14.3 mg/dL (ref 7.0–26.0)
CO2: 27 mEq/L (ref 22–29)
Calcium: 8.8 mg/dL (ref 8.4–10.4)
Chloride: 100 mEq/L (ref 98–109)
Creatinine: 0.9 mg/dL (ref 0.7–1.3)
Glucose: 258 mg/dl — ABNORMAL HIGH (ref 70–140)
POTASSIUM: 4.4 meq/L (ref 3.5–5.1)
SODIUM: 137 meq/L (ref 136–145)
Total Bilirubin: 0.48 mg/dL (ref 0.20–1.20)
Total Protein: 6 g/dL — ABNORMAL LOW (ref 6.4–8.3)

## 2014-11-07 LAB — TECHNOLOGIST REVIEW

## 2014-11-07 MED ORDER — IOHEXOL 300 MG/ML  SOLN
100.0000 mL | Freq: Once | INTRAMUSCULAR | Status: AC | PRN
  Administered 2014-11-07: 100 mL via INTRAVENOUS

## 2014-11-07 NOTE — Telephone Encounter (Signed)
Per staff message and POF I have scheduled appts. Advised scheduler of appts. JMW  

## 2014-11-08 ENCOUNTER — Ambulatory Visit (HOSPITAL_BASED_OUTPATIENT_CLINIC_OR_DEPARTMENT_OTHER): Payer: Medicare Other | Admitting: Internal Medicine

## 2014-11-08 ENCOUNTER — Encounter: Payer: Self-pay | Admitting: Internal Medicine

## 2014-11-08 ENCOUNTER — Other Ambulatory Visit: Payer: Medicare Other

## 2014-11-08 ENCOUNTER — Encounter: Payer: Self-pay | Admitting: *Deleted

## 2014-11-08 ENCOUNTER — Ambulatory Visit (HOSPITAL_BASED_OUTPATIENT_CLINIC_OR_DEPARTMENT_OTHER): Payer: Medicare Other

## 2014-11-08 ENCOUNTER — Encounter: Payer: Self-pay | Admitting: Pharmacist

## 2014-11-08 VITALS — BP 151/79 | HR 78 | Temp 97.5°F | Resp 18 | Ht 69.0 in | Wt 187.3 lb

## 2014-11-08 DIAGNOSIS — Z5111 Encounter for antineoplastic chemotherapy: Secondary | ICD-10-CM

## 2014-11-08 DIAGNOSIS — I4891 Unspecified atrial fibrillation: Secondary | ICD-10-CM

## 2014-11-08 DIAGNOSIS — R42 Dizziness and giddiness: Secondary | ICD-10-CM

## 2014-11-08 DIAGNOSIS — E860 Dehydration: Secondary | ICD-10-CM

## 2014-11-08 DIAGNOSIS — C3492 Malignant neoplasm of unspecified part of left bronchus or lung: Secondary | ICD-10-CM

## 2014-11-08 DIAGNOSIS — C3411 Malignant neoplasm of upper lobe, right bronchus or lung: Secondary | ICD-10-CM

## 2014-11-08 DIAGNOSIS — J90 Pleural effusion, not elsewhere classified: Secondary | ICD-10-CM

## 2014-11-08 DIAGNOSIS — I959 Hypotension, unspecified: Secondary | ICD-10-CM

## 2014-11-08 MED ORDER — ONDANSETRON 16 MG/50ML IVPB (CHCC)
INTRAVENOUS | Status: AC
  Filled 2014-11-08: qty 16

## 2014-11-08 MED ORDER — DEXAMETHASONE SODIUM PHOSPHATE 20 MG/5ML IJ SOLN
INTRAMUSCULAR | Status: AC
  Filled 2014-11-08: qty 5

## 2014-11-08 MED ORDER — CARBOPLATIN CHEMO INJECTION 600 MG/60ML
500.0000 mg | Freq: Once | INTRAVENOUS | Status: AC
  Administered 2014-11-08: 500 mg via INTRAVENOUS
  Filled 2014-11-08: qty 50

## 2014-11-08 MED ORDER — DEXAMETHASONE SODIUM PHOSPHATE 20 MG/5ML IJ SOLN
20.0000 mg | Freq: Once | INTRAMUSCULAR | Status: AC
  Administered 2014-11-08: 20 mg via INTRAVENOUS

## 2014-11-08 MED ORDER — CARBOPLATIN CHEMO INTRADERMAL TEST DOSE 100MCG/0.02ML
100.0000 ug | Freq: Once | INTRADERMAL | Status: AC
  Administered 2014-11-08: 100 ug via INTRADERMAL
  Filled 2014-11-08: qty 0.01

## 2014-11-08 MED ORDER — ONDANSETRON 16 MG/50ML IVPB (CHCC)
16.0000 mg | Freq: Once | INTRAVENOUS | Status: AC
  Administered 2014-11-08: 16 mg via INTRAVENOUS

## 2014-11-08 MED ORDER — SODIUM CHLORIDE 0.9 % IV SOLN
Freq: Once | INTRAVENOUS | Status: AC
  Administered 2014-11-08: 10:00:00 via INTRAVENOUS

## 2014-11-08 MED ORDER — SODIUM CHLORIDE 0.9 % IV SOLN
498.0000 mg/m2 | Freq: Once | INTRAVENOUS | Status: AC
  Administered 2014-11-08: 1000 mg via INTRAVENOUS
  Filled 2014-11-08: qty 40

## 2014-11-08 NOTE — Patient Instructions (Signed)
[facility] Discharge Instructions for Patients Receiving Chemotherapy  Today you received the following chemotherapy agents Alimta/Carboplatin  To help prevent nausea and vomiting after your treatment, we encourage you to take your nausea medication   If you develop nausea and vomiting that is not controlled by your nausea medication, call the clinic.   BELOW ARE SYMPTOMS THAT SHOULD BE REPORTED IMMEDIATELY:  *FEVER GREATER THAN 100.5 F  *CHILLS WITH OR WITHOUT FEVER  NAUSEA AND VOMITING THAT IS NOT CONTROLLED WITH YOUR NAUSEA MEDICATION  *UNUSUAL SHORTNESS OF BREATH  *UNUSUAL BRUISING OR BLEEDING  TENDERNESS IN MOUTH AND THROAT WITH OR WITHOUT PRESENCE OF ULCERS  *URINARY PROBLEMS  *BOWEL PROBLEMS  UNUSUAL RASH Items with * indicate a potential emergency and should be followed up as soon as possible.  Feel free to call the clinic you have any questions or concerns. The clinic phone number is [phone].

## 2014-11-08 NOTE — Progress Notes (Signed)
[facility] [phone]   Fax:([phone] [place], [place] [address]  DIAGNOSIS: Stage IIIB/IV non-small cell lung cancer, adenocarcinoma diagnosed in March of 2015.  PRIOR THERAPY:  1) Systemic chemotherapy with carboplatin for AUC of 5 and Alimta 500 mg/M2 every 3 weeks. First dose expected on 04/13/2014. Status post 6 cycles. 2)  Maintenance chemotherapy with single agent Alimta 500 mg/M2 every 3 weeks. First cycle on 09/06/2014. Status post 1 cycle.  CURRENT THERAPY: Second course of systemic chemotherapy with carboplatin for AUC of 5 and Alimta 500 MG/M2 every 3 weeks. First cycle 11/08/2014.  INTERVAL HISTORY: Shawn Espinoza 73 y.o. male returns to the clinic today for follow up visit. The patient tolerated the previous 3 cycles of his maintenance chemotherapy with single agent Alimta fairly well with no significant adverse effects. He continues to have some dizzy spells especially with change of position. He denied having any significant chest pain but continues to have shortness of breath and mild cough with no hemoptysis. He has no significant fever or chills, no nausea or vomiting. He continues with drainage of the Pleurx catheter up to 1000 mL few times a week. He had repeat CT scan of the chest, abdomen and pelvis performed recently and he is here for evaluation and discussion of his scan results.  MEDICAL HISTORY: Past Medical History  Diagnosis Date  . History of diabetes mellitus, type II     resolved with diet, h/o neuropathy  . Diverticulosis of colon   . Hyperlipidemia   . Hypertension   . Fatty liver 02/29/00    abd ultrasound  . Lower back pain   . Systolic murmur 9794    2Decho - normal LV fxn, EF 55%, mild AS, biatrial enlargement  . History of tobacco use quit 1990s  . 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 Personal history of colonic adenomas 07/06/2013  . Hypertensive retinopathy of both eyes 2015    mild  . 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 Dysrhythmia     Atrial fib (found 07/2014)  . Shortness of breath   . Pneumonia   . GERD (gastroesophageal reflux disease)     occasional  . Arthritis   . Neuropathy   . Constipation     ALLERGIES:  is allergic to candesartan cilexetil; diltiazem hcl; doxazosin mesylate; nifedipine; pravastatin; red yeast rice; rosuvastatin; and simvastatin.  MEDICATIONS:  Current Outpatient Prescriptions  Medication Sig Dispense Refill  . albuterol (PROVENTIL HFA;VENTOLIN HFA) 108 (90 BASE) MCG/ACT inhaler Inhale 2 puffs into the lungs every 6 (six) hours as needed for wheezing or shortness of breath. 1 Inhaler 2  . amLODipine (NORVASC) 5 MG tablet Take 1 tablet (5 mg total) by mouth daily. 90 tablet 2  . atenolol (TENORMIN) 25 MG tablet Take 1 tablet (25 mg total) by mouth 2 (two) times daily. 180 tablet 2  . cetirizine (ZYRTEC) 10 MG tablet Take 10 mg by mouth daily as needed for allergies or rhinitis.     . chlorpheniramine-HYDROcodone (TUSSIONEX) 10-8 MG/5ML LQCR Take 5 mLs by mouth every 12 (twelve) hours as needed for cough. 115 mL 0  . cholecalciferol (VITAMIN D) 1000 UNITS tablet Take 1,000 Units by mouth  every morning.     Marland Kitchen dexamethasone (DECADRON) 4 MG tablet 4 mg by mouth twice a day the day before, day of and day after the chemotherapy every 3 weeks. 40 tablet 1  . ELIQUIS 5 MG TABS tablet TAKE 1 TABLET (5 MG TOTAL) BY MOUTH 2 (TWO) TIMES DAILY. 60 tablet 3  . folic acid (FOLVITE) 1 MG tablet TAKE 1 TABLET BY MOUTH EVERY DAY 30 tablet 4  . glimepiride (AMARYL) 1 MG tablet Take 1 tablet (1 mg total) by mouth as directed. Take on days you take decadron. 30 tablet 0  . HYDROcodone-acetaminophen (NORCO/VICODIN) 5-325 MG per tablet Take 1 tablet by mouth every  6 (six) hours as needed for moderate pain.    Marland Kitchen ibuprofen (ADVIL,MOTRIN) 800 MG tablet Take 800 mg by mouth 2 (two) times daily as needed for moderate pain.    . Multiple Vitamins-Minerals (EYE VITAMINS PO) Take 1 capsule by mouth 2 (two) times daily. Due to macular degeneration    . predniSONE (DELTASONE) 10 MG tablet 60 mg QD x 2 weeks; then 40 mg QD x 2 weeks; then 20 mg QD x 1 week; then 10 mg QD till gone. 161 tablet 0  . sucralfate (CARAFATE) 1 GM/10ML suspension Take 1 g by mouth 4 (four) times daily as needed (reflux).    . Alum & Mag Hydroxide-Simeth (MAGIC MOUTHWASH W/LIDOCAINE) SOLN Take 5 mLs by mouth 3 (three) times daily as needed for mouth pain. 240 mL 0  . furosemide (LASIX) 20 MG tablet Take 1 tablet (20 mg total) by mouth daily. 90 tablet 1  . methocarbamol (ROBAXIN) 500 MG tablet Take 500 mg by mouth every 8 (eight) hours as needed (pain).     . prochlorperazine (COMPAZINE) 10 MG tablet Take 1 tablet (10 mg total) by mouth every 6 (six) hours as needed for nausea or vomiting. 60 tablet 0   No current facility-administered medications for this visit.    SURGICAL HISTORY:  Past Surgical History  Procedure Laterality Date  . Colonoscopy  2004  . Colonoscopy  2014    tubular adenoma x1, mod diverticulosis (Gessner)  . Flexible bronchoscopy  02/2014    WNL  . Video bronchoscopy Bilateral 03/17/2014    Procedure: VIDEO BRONCHOSCOPY WITH FLUORO;  Surgeon: Tanda Rockers, MD;  Location: [facility];  Service: Cardiopulmonary;  Laterality: Bilateral;  . Chest tube insertion Left 08/26/2014    Procedure: INSERTION OF LEFT  PLEURAL DRAINAGE CATHETER;  Surgeon: Ivin Poot, MD;  Location: [facility];  Service: Thoracic;  Laterality: Left;    REVIEW OF SYSTEMS:  Constitutional: positive for fatigue Eyes: negative Ears, nose, mouth, throat, and face: negative Respiratory: positive for cough and dyspnea on exertion Cardiovascular: negative Gastrointestinal:  negative Genitourinary:negative Integument/breast: negative Hematologic/lymphatic: negative Musculoskeletal:negative Neurological: negative Behavioral/Psych: negative Endocrine: negative Allergic/Immunologic: negative   PHYSICAL EXAMINATION: General appearance: alert, cooperative, fatigued and no distress Head: Normocephalic, without obvious abnormality, atraumatic Neck: no adenopathy, no JVD, supple, symmetrical, trachea midline and thyroid not enlarged, symmetric, no tenderness/mass/nodules Lymph nodes: Cervical, supraclavicular, and axillary nodes normal. Resp: diminished breath sounds LLL and dullness to percussion LLL Back: symmetric, no curvature. ROM normal. No CVA tenderness. Cardio: regular rate and rhythm, S1, S2 normal, no murmur, click, rub or gallop GI: soft, non-tender; bowel sounds normal; no masses,  no organomegaly Extremities: extremities normal, atraumatic, no cyanosis or edema Neurologic: Alert and oriented X 3, normal strength and tone. Normal symmetric reflexes. Normal coordination and gait  ECOG PERFORMANCE  STATUS: 1 - Symptomatic but completely ambulatory  Blood pressure 151/79, pulse 78, temperature 97.5 F (36.4 C), temperature source Oral, resp. rate 18, height 5\' 9"  (1.753 m), weight 187 lb 4.8 oz (84.959 kg), SpO2 97 %.  LABORATORY DATA: Lab Results  Component Value Date   WBC 5.9 11/07/2014   HGB 13.3 11/07/2014   HCT 41.1 11/07/2014   MCV 92.2 11/07/2014   PLT 156 11/07/2014      Chemistry      Component Value Date/Time   NA 137 11/07/2014 1359   NA 140 08/26/2014 1246   K 4.4 11/07/2014 1359   K 4.5 08/26/2014 1246   CL 100 08/26/2014 1246   CO2 27 11/07/2014 1359   CO2 29 08/26/2014 1246   BUN 14.3 11/07/2014 1359   BUN 16 08/26/2014 1246   CREATININE 0.9 11/07/2014 1359   CREATININE 0.76 08/26/2014 1246   CREATININE 0.83 02/22/2014 1314      Component Value Date/Time   CALCIUM 8.8 11/07/2014 1359   CALCIUM 8.7 08/26/2014 1246    ALKPHOS 68 11/07/2014 1359   ALKPHOS 68 08/13/2014 1222   AST 30 11/07/2014 1359   AST 21 08/13/2014 1222   ALT 40 11/07/2014 1359   ALT 15 08/13/2014 1222   BILITOT 0.48 11/07/2014 1359   BILITOT 1.3* 08/13/2014 1222       RADIOGRAPHIC STUDIES: Ct Chest W Contrast  11/07/2014   CLINICAL DATA:  F/u NSCL CA dx 02/2014; chemo maintenance; no XRT;hx of Hep C;some cough, no other complaints; Patient stated that he had 525 mL fluid drawn off this weekend of his lung.  EXAM: CT CHEST, ABDOMEN, AND PELVIS WITH CONTRAST  TECHNIQUE: Multidetector CT imaging of the chest, abdomen and pelvis was performed following the standard protocol during bolus administration of intravenous contrast.  CONTRAST:  139mL OMNIPAQUE IOHEXOL 300 MG/ML  SOLN  COMPARISON:  Chest CT, 10/07/2014. Abdomen and pelvis CT, 08/15/2014.  FINDINGS: CT CHEST FINDINGS  Minimal left pleural effusion.  No right pleural effusion.  Focal area pleural based opacity in the lateral left lower lobe currently measures 13 mm by 6.6 mm, previously 12 mm x 9 mm. This is in close proximity to the placement of a recent prior left chest tube. It is likely atelectasis. There is minimal dependent subsegmental atelectasis and/ or scarring. No other focal lung opacities. No evidence of pneumonia. Mild changes of centrilobular emphysema are noted in the upper lobes.  Heart is normal in size and configuration. Great vessels are normal in caliber. No neck base, axillary mediastinal or hilar masses or pathologically enlarged lymph nodes. Lower esophageal wall appears mildly thickened as it did on the prior study. Consider esophagitis if there are any symptoms of dysphagia. No evidence of a discrete esophageal mass.  CT ABDOMEN AND PELVIS FINDINGS  There is a mixed attenuation mass in the right lobe of the liver at the dome, mostly low attenuation with hazy internal areas of intermediate attenuation and a rim of delayed mild enhancement. This lesion currently  measures 5.7 cm by 4.7 cm x 5 cm, previously 4.9 cm x 4.2 cm x 4.2 cm.  No other liver masses or lesions. Liver is diffusely hypoattenuating consistent with fatty infiltration.  Spleen, gallbladder and pancreas are unremarkable.  No adrenal masses.  Normal kidneys, ureters and bladder.  Prominent to mildly enlarged gastrohepatic ligament lymph nodes. Largest measures 13 mm in short axis. These are stable. No other adenopathy. No ascites.  There are scattered left colon diverticula.  No diverticulitis. Colon otherwise unremarkable. Normal small bowel. Normal appendix.  Atherosclerotic calcifications are noted along the abdominal aorta and its branch vessels. There is a short dissection of the proximal left external iliac artery, stable.  MUSCULOSKELETAL:  No osteoblastic or osteolytic lesions.  IMPRESSION: 1. No acute findings. 2. Minimal amount of residual left pleural fluid. No right pleural effusion. No evidence of pneumonia or edema. 3. No evidence of locally recurrent lung carcinoma the chest or of metastatic disease in the chest. 4. Single hepatic metastatic lesion at the dome of the right lobe is larger than on the prior study, currently 5.7 cm x 4.7 cm x 5 cm, previously 4.9 cm x 4.2 cm x 4.2 cm. No new liver lesions. 5. No other evidence of metastatic disease below the diaphragm. No evidence of bony metastatic disease.   Electronically Signed   By: Lajean Manes M.D.   On: 11/07/2014 18:02   Ct Abdomen Pelvis W Contrast  11/07/2014   CLINICAL DATA:  F/u NSCL CA dx 02/2014; chemo maintenance; no XRT;hx of Hep C;some cough, no other complaints; Patient stated that he had 525 mL fluid drawn off this weekend of his lung.  EXAM: CT CHEST, ABDOMEN, AND PELVIS WITH CONTRAST  TECHNIQUE: Multidetector CT imaging of the chest, abdomen and pelvis was performed following the standard protocol during bolus administration of intravenous contrast.  CONTRAST:  147mL OMNIPAQUE IOHEXOL 300 MG/ML  SOLN  COMPARISON:  Chest  CT, 10/07/2014. Abdomen and pelvis CT, 08/15/2014.  FINDINGS: CT CHEST FINDINGS  Minimal left pleural effusion.  No right pleural effusion.  Focal area pleural based opacity in the lateral left lower lobe currently measures 13 mm by 6.6 mm, previously 12 mm x 9 mm. This is in close proximity to the placement of a recent prior left chest tube. It is likely atelectasis. There is minimal dependent subsegmental atelectasis and/ or scarring. No other focal lung opacities. No evidence of pneumonia. Mild changes of centrilobular emphysema are noted in the upper lobes.  Heart is normal in size and configuration. Great vessels are normal in caliber. No neck base, axillary mediastinal or hilar masses or pathologically enlarged lymph nodes. Lower esophageal wall appears mildly thickened as it did on the prior study. Consider esophagitis if there are any symptoms of dysphagia. No evidence of a discrete esophageal mass.  CT ABDOMEN AND PELVIS FINDINGS  There is a mixed attenuation mass in the right lobe of the liver at the dome, mostly low attenuation with hazy internal areas of intermediate attenuation and a rim of delayed mild enhancement. This lesion currently measures 5.7 cm by 4.7 cm x 5 cm, previously 4.9 cm x 4.2 cm x 4.2 cm.  No other liver masses or lesions. Liver is diffusely hypoattenuating consistent with fatty infiltration.  Spleen, gallbladder and pancreas are unremarkable.  No adrenal masses.  Normal kidneys, ureters and bladder.  Prominent to mildly enlarged gastrohepatic ligament lymph nodes. Largest measures 13 mm in short axis. These are stable. No other adenopathy. No ascites.  There are scattered left colon diverticula. No diverticulitis. Colon otherwise unremarkable. Normal small bowel. Normal appendix.  Atherosclerotic calcifications are noted along the abdominal aorta and its branch vessels. There is a short dissection of the proximal left external iliac artery, stable.  MUSCULOSKELETAL:  No osteoblastic  or osteolytic lesions.  IMPRESSION: 1. No acute findings. 2. Minimal amount of residual left pleural fluid. No right pleural effusion. No evidence of pneumonia or edema. 3. No evidence of locally recurrent  lung carcinoma the chest or of metastatic disease in the chest. 4. Single hepatic metastatic lesion at the dome of the right lobe is larger than on the prior study, currently 5.7 cm x 4.7 cm x 5 cm, previously 4.9 cm x 4.2 cm x 4.2 cm. No new liver lesions. 5. No other evidence of metastatic disease below the diaphragm. No evidence of bony metastatic disease.   Electronically Signed   By: Lajean Manes M.D.   On: 11/07/2014 18:02   ASSESSMENT AND PLAN: this is a very pleasant 73 years old white male with:  1) Stage IV non-small cell lung cancer, adenocarcinoma presenting with large right upper lobe lung mass in addition to mediastinal and bilateral hilar lymphadenopathy as well as cervical lymphadenopathy and left pleural effusion. The patient completed a course systemic chemotherapy with carboplatin and Alimta status post 6 cycles. This was followed by 3 cycles of maintenance chemotherapy with single agent Alimta. The patient tolerated his treatment well. His recent CT scan of the chest, abdomen and pelvis showed increase in the size of the solitary liver lesion. His disease in the chest has been stable with improvement of the left pleural effusion. I had a lengthy discussion with the patient today about his current condition and treatment options. I gave the patient several options for treatment of his condition including adding carboplatin to Alimta with the hope to control his disease in the liver versus discontinued treatment with Alimta and switching to either docetaxel plus/Cyramza versus treatment with immunotherapy. After discussion of the options the patient would like to try treatment with carboplatin and Alimta again. Here to start the first cycle of this treatment today. He is very familiar  with the adverse effect of this regimen.  2) Recurrent left pleural effusion: He is status post Pleurx catheter placement. Continue drainage of the pleural fluid 2-3 times a week.   3) Atrial fibrillation: continue Aliquis.  4) Dizziness: secondary to dehydration and hypotension. I will arrange for the patient to receive 500 mL of normal saline today during his chemotherapy. He was encouraged to increase his by mouth intake.  The patient would come back for followup visit in 3 weeks with the start of the next cycle of his chemotherapy for evaluation and management any adverse effect of his treatment.  He was advised to call immediately if he has any concerning symptoms in the interval.  The patient voices understanding of current disease status and treatment options and is in agreement with the current care plan.  All questions were answered. The patient knows to call the clinic with any problems, questions or concerns. We can certainly see the patient much sooner if necessary.  Disclaimer: This note was dictated with voice recognition software. Similar sounding words can inadvertently be transcribed and may not be corrected upon review.

## 2014-11-09 ENCOUNTER — Other Ambulatory Visit: Payer: Self-pay | Admitting: Cardiothoracic Surgery

## 2014-11-09 ENCOUNTER — Ambulatory Visit (INDEPENDENT_AMBULATORY_CARE_PROVIDER_SITE_OTHER): Payer: Medicare Other | Admitting: Cardiothoracic Surgery

## 2014-11-09 ENCOUNTER — Telehealth: Payer: Self-pay | Admitting: Internal Medicine

## 2014-11-09 ENCOUNTER — Encounter: Payer: Self-pay | Admitting: Cardiothoracic Surgery

## 2014-11-09 ENCOUNTER — Other Ambulatory Visit: Payer: Self-pay

## 2014-11-09 VITALS — BP 130/87 | HR 80 | Resp 18 | Ht 69.0 in | Wt 187.0 lb

## 2014-11-09 DIAGNOSIS — J910 Malignant pleural effusion: Secondary | ICD-10-CM

## 2014-11-09 DIAGNOSIS — C3492 Malignant neoplasm of unspecified part of left bronchus or lung: Secondary | ICD-10-CM

## 2014-11-09 DIAGNOSIS — I509 Heart failure, unspecified: Secondary | ICD-10-CM

## 2014-11-09 NOTE — Progress Notes (Signed)
11/09/14 @ 1:00 pm:  Was informed that the face to face interviews will be moved to December 10th and 11th.  Called Shawn Espinoza to offer him the 9:00 am slot on December 01, 2014.

## 2014-11-09 NOTE — Telephone Encounter (Signed)
returned pt call and r/s appt per pt request...he wants all lab only appts moved to wednesdays...done.Marland KitchenMarland Kitchenpt wife aware

## 2014-11-09 NOTE — Progress Notes (Signed)
PCP is [name], MD Referring Provider is Curt Bears, MD  Chief Complaint  Patient presents with  . Malignant Pleural Effusion    4 week f/u with a cxr to assess L pleurX.Marland KitchenMarland KitchenM-W-F DRAINAGE...averaging 200-500cc    HPI: Returns for routine monthly Pleurx check Patient has metastatic adenocarcinoma lung with recurrent malignant effusion  The Pleurx drains Monday  - Thursday,  Usually 300-500 cc Most recent CT scan of the chest performed 48 hours ago is reviewed showing no significant left pleural effusion, no pulmonary masses or suspicious adenopathy. Unfortunately the lesion in the right lobe of the liver has increased from June by 8-10 mm. 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 Fib when he presented earlier  this summerwith large pleural effusion and shortness of breath. He has had no recurrent A. Fib since being placed on a beta blockerand having his pleural effusion drained with a Pleurx catheter. A recent echocardiogram showed no significant valvular disease with good LV function. We will stop the Eliquis   Past Medical History  Diagnosis Date  . History of diabetes mellitus, type II     resolved with diet, h/o neuropathy  . Diverticulosis of colon   . Hyperlipidemia   . Hypertension   . Fatty liver 02/29/00    abd ultrasound  . Lower back pain   . Systolic murmur 9449    2Decho - normal LV fxn, EF 55%, mild AS, biatrial enlargement  . History of tobacco use quit 1990s  . 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 Personal history of colonic adenomas 07/06/2013  . Hypertensive retinopathy of both eyes 2015    mild  . 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 Dysrhythmia     Atrial fib  (found 07/2014)  . Shortness of breath   . Pneumonia   . GERD (gastroesophageal reflux disease)     occasional  . Arthritis   . Neuropathy   . Constipation     Past Surgical History  Procedure Laterality Date  . Colonoscopy  2004  . Colonoscopy  2014    tubular adenoma x1, mod diverticulosis (Gessner)  . Flexible bronchoscopy  02/2014    WNL  . Video bronchoscopy Bilateral 03/17/2014    Procedure: VIDEO BRONCHOSCOPY WITH FLUORO;  Surgeon: Tanda Rockers, MD;  Location: [facility];  Service: Cardiopulmonary;  Laterality: Bilateral;  . Chest tube insertion Left 08/26/2014    Procedure: INSERTION OF LEFT  PLEURAL DRAINAGE CATHETER;  Surgeon: Ivin Poot, MD;  Location: [facility];  Service: Thoracic;  Laterality: Left;    Family History  Problem Relation Age of Onset  . Stroke Mother     multiple mini strokes  . Hypertension Mother   . Heart disease Maternal Grandfather     MI  . Stroke Paternal Grandmother   . Colon cancer Neg Hx     Social History History  Substance Use Topics  . Smoking status: Former Smoker -- 3.00 packs/day for 30 years    Types: Cigarettes    Quit date: 12/23/1988  . Smokeless tobacco: Never Used  . Alcohol Use: 12.6 oz/week    21 Glasses of wine per week  Comment: 3-4 wine a day ( 8/03- 2 beers, 2 wines, 2 brandies daily)    Current Outpatient Prescriptions  Medication Sig Dispense Refill  . albuterol (PROVENTIL HFA;VENTOLIN HFA) 108 (90 BASE) MCG/ACT inhaler Inhale 2 puffs into the lungs every 6 (six) hours as needed for wheezing or shortness of breath. 1 Inhaler 2  . Alum & Mag Hydroxide-Simeth (MAGIC MOUTHWASH W/LIDOCAINE) SOLN Take 5 mLs by mouth 3 (three) times daily as needed for mouth pain. 240 mL 0  . amLODipine (NORVASC) 5 MG tablet Take 1 tablet (5 mg total) by mouth daily. 90 tablet 2  . atenolol (TENORMIN) 25 MG tablet Take 1 tablet (25 mg total) by mouth 2 (two) times daily. 180 tablet 2  . cetirizine (ZYRTEC) 10 MG tablet Take 10  mg by mouth daily as needed for allergies or rhinitis.     . chlorpheniramine-HYDROcodone (TUSSIONEX) 10-8 MG/5ML LQCR Take 5 mLs by mouth every 12 (twelve) hours as needed for cough. 115 mL 0  . cholecalciferol (VITAMIN D) 1000 UNITS tablet Take 1,000 Units by mouth every morning.     Marland Kitchen dexamethasone (DECADRON) 4 MG tablet 4 mg by mouth twice a day the day before, day of and day after the chemotherapy every 3 weeks. 40 tablet 1  . ELIQUIS 5 MG TABS tablet TAKE 1 TABLET (5 MG TOTAL) BY MOUTH 2 (TWO) TIMES DAILY. 60 tablet 3  . folic acid (FOLVITE) 1 MG tablet TAKE 1 TABLET BY MOUTH EVERY DAY 30 tablet 4  . glimepiride (AMARYL) 1 MG tablet Take 1 tablet (1 mg total) by mouth as directed. Take on days you take decadron. 30 tablet 0  . HYDROcodone-acetaminophen (NORCO/VICODIN) 5-325 MG per tablet Take 1 tablet by mouth every 6 (six) hours as needed for moderate pain.    Marland Kitchen ibuprofen (ADVIL,MOTRIN) 800 MG tablet Take 800 mg by mouth 2 (two) times daily as needed for moderate pain.    . methocarbamol (ROBAXIN) 500 MG tablet Take 500 mg by mouth every 8 (eight) hours as needed (pain).     . Multiple Vitamins-Minerals (EYE VITAMINS PO) Take 1 capsule by mouth 2 (two) times daily. Due to macular degeneration    . predniSONE (DELTASONE) 10 MG tablet 60 mg QD x 2 weeks; then 40 mg QD x 2 weeks; then 20 mg QD x 1 week; then 10 mg QD till gone. 161 tablet 0  . prochlorperazine (COMPAZINE) 10 MG tablet Take 1 tablet (10 mg total) by mouth every 6 (six) hours as needed for nausea or vomiting. 60 tablet 0  . sucralfate (CARAFATE) 1 GM/10ML suspension Take 1 g by mouth 4 (four) times daily as needed (reflux).     No current facility-administered medications for this visit.    Allergies  Allergen Reactions  . Candesartan Cilexetil     REACTION: Muscle spasms  . Diltiazem Hcl     REACTION: Dizziness  . Doxazosin Mesylate Other (See Comments)    REACTION: H/A's  . Nifedipine     REACTION: Leg swelling  .  Pravastatin Other (See Comments)    Muscle aches  . Red Yeast Rice     Muscle aches  . Rosuvastatin Other (See Comments)    REACTION: questionable: severe constipation  . Simvastatin Other (See Comments)    REACTION: Muscle aches    Review of Systems patient overall doing well but complains of frequent urination. States his blood sugars are controlled. He stopped taking Lasix. He is working part-time. His weight is  stable. No weight gain.  BP 130/87 mmHg  Pulse 80  Resp 18  Ht 5\' 9"  (1.753 m)  Wt 187 lb (84.823 kg)  BMI 27.60 kg/m2  SpO2 97% Physical Exam Alert and comfortable Heart rate regular Breath sounds clear Left Pleurx dressing dry and intact No pedal edema  Diagnostic Tests: CT scan performed 48 hours ago is reviewed  Impression: Recurrent left malignant effusion-continue Pleurx therapy  Plan: Continue Pleurx therapy-drainage on Monday to Thursday schedule Stop anticoagulation with Eliquis for short duration atrial fibrillation when he was short of breath and had large pleural effusion  Flomax 0.4 mg daily to help with his frequent urination problem  Return in 4 weeks with chest x-ray for Pleurx catheter check

## 2014-11-14 ENCOUNTER — Telehealth: Payer: Self-pay | Admitting: Internal Medicine

## 2014-11-14 ENCOUNTER — Telehealth: Payer: Self-pay | Admitting: *Deleted

## 2014-11-14 NOTE — Telephone Encounter (Signed)
Pt's wife called stating that pt is c/o soreness in his mouth, no visible sores, he does have some trouble swallowing, he is using magic mouthwash TID.  Per Dr Vista Mink use Biotene as well up to QID, call with any changes, she verbalized understanding

## 2014-11-14 NOTE — Telephone Encounter (Signed)
pt called to r/s lab...done...pt aware of new time

## 2014-11-15 ENCOUNTER — Ambulatory Visit: Payer: Medicare Other

## 2014-11-15 ENCOUNTER — Other Ambulatory Visit: Payer: Medicare Other

## 2014-11-16 ENCOUNTER — Other Ambulatory Visit: Payer: Medicare Other

## 2014-11-16 ENCOUNTER — Other Ambulatory Visit (HOSPITAL_BASED_OUTPATIENT_CLINIC_OR_DEPARTMENT_OTHER): Payer: Medicare Other

## 2014-11-16 DIAGNOSIS — C3490 Malignant neoplasm of unspecified part of unspecified bronchus or lung: Secondary | ICD-10-CM

## 2014-11-16 LAB — CBC WITH DIFFERENTIAL/PLATELET
BASO%: 0.4 % (ref 0.0–2.0)
Basophils Absolute: 0 10*3/uL (ref 0.0–0.1)
EOS ABS: 0 10*3/uL (ref 0.0–0.5)
EOS%: 0.9 % (ref 0.0–7.0)
HCT: 38.1 % — ABNORMAL LOW (ref 38.4–49.9)
HGB: 12.2 g/dL — ABNORMAL LOW (ref 13.0–17.1)
LYMPH%: 15.4 % (ref 14.0–49.0)
MCH: 29.5 pg (ref 27.2–33.4)
MCHC: 32 g/dL (ref 32.0–36.0)
MCV: 92.3 fL (ref 79.3–98.0)
MONO#: 0.2 10*3/uL (ref 0.1–0.9)
MONO%: 10.6 % (ref 0.0–14.0)
NEUT#: 1.7 10*3/uL (ref 1.5–6.5)
NEUT%: 72.7 % (ref 39.0–75.0)
PLATELETS: 75 10*3/uL — AB (ref 140–400)
RBC: 4.13 10*6/uL — AB (ref 4.20–5.82)
RDW: 16.2 % — ABNORMAL HIGH (ref 11.0–14.6)
WBC: 2.3 10*3/uL — ABNORMAL LOW (ref 4.0–10.3)
lymph#: 0.4 10*3/uL — ABNORMAL LOW (ref 0.9–3.3)

## 2014-11-16 LAB — COMPREHENSIVE METABOLIC PANEL (CC13)
ALT: 41 U/L (ref 0–55)
AST: 26 U/L (ref 5–34)
Albumin: 3.2 g/dL — ABNORMAL LOW (ref 3.5–5.0)
Alkaline Phosphatase: 70 U/L (ref 40–150)
Anion Gap: 10 mEq/L (ref 3–11)
BILIRUBIN TOTAL: 0.8 mg/dL (ref 0.20–1.20)
BUN: 20.1 mg/dL (ref 7.0–26.0)
CO2: 27 mEq/L (ref 22–29)
Calcium: 8.5 mg/dL (ref 8.4–10.4)
Chloride: 99 mEq/L (ref 98–109)
Creatinine: 1 mg/dL (ref 0.7–1.3)
GLUCOSE: 425 mg/dL — AB (ref 70–140)
Potassium: 4.2 mEq/L (ref 3.5–5.1)
SODIUM: 136 meq/L (ref 136–145)
TOTAL PROTEIN: 5.4 g/dL — AB (ref 6.4–8.3)

## 2014-11-22 ENCOUNTER — Telehealth: Payer: Self-pay | Admitting: *Deleted

## 2014-11-22 ENCOUNTER — Other Ambulatory Visit: Payer: Medicare Other

## 2014-11-22 NOTE — Telephone Encounter (Signed)
Pt's wife called stating that pt hit his shin last week and has a laceration.  She has put neosporin on it but it is still bleeding a week later and is not healing with redness around the site.  She feels like it needs to be evaluated.  Per Dr Vista Mink, okay for Selena Lesser to evaluate.  Appt given to pt's wife for 11/23/14

## 2014-11-23 ENCOUNTER — Ambulatory Visit (HOSPITAL_BASED_OUTPATIENT_CLINIC_OR_DEPARTMENT_OTHER): Payer: Medicare Other | Admitting: Nurse Practitioner

## 2014-11-23 ENCOUNTER — Encounter: Payer: Self-pay | Admitting: Nurse Practitioner

## 2014-11-23 ENCOUNTER — Other Ambulatory Visit (HOSPITAL_BASED_OUTPATIENT_CLINIC_OR_DEPARTMENT_OTHER): Payer: Medicare Other

## 2014-11-23 VITALS — BP 135/86 | HR 74 | Temp 98.1°F | Resp 18 | Ht 69.0 in | Wt 192.6 lb

## 2014-11-23 DIAGNOSIS — D696 Thrombocytopenia, unspecified: Secondary | ICD-10-CM | POA: Insufficient documentation

## 2014-11-23 DIAGNOSIS — C3490 Malignant neoplasm of unspecified part of unspecified bronchus or lung: Secondary | ICD-10-CM

## 2014-11-23 DIAGNOSIS — C3492 Malignant neoplasm of unspecified part of left bronchus or lung: Secondary | ICD-10-CM

## 2014-11-23 DIAGNOSIS — S80811A Abrasion, right lower leg, initial encounter: Secondary | ICD-10-CM

## 2014-11-23 DIAGNOSIS — J8489 Other specified interstitial pulmonary diseases: Secondary | ICD-10-CM

## 2014-11-23 DIAGNOSIS — C3412 Malignant neoplasm of upper lobe, left bronchus or lung: Secondary | ICD-10-CM

## 2014-11-23 DIAGNOSIS — E119 Type 2 diabetes mellitus without complications: Secondary | ICD-10-CM

## 2014-11-23 DIAGNOSIS — Z8639 Personal history of other endocrine, nutritional and metabolic disease: Secondary | ICD-10-CM

## 2014-11-23 LAB — COMPREHENSIVE METABOLIC PANEL (CC13)
ALT: 34 U/L (ref 0–55)
AST: 25 U/L (ref 5–34)
Albumin: 3 g/dL — ABNORMAL LOW (ref 3.5–5.0)
Alkaline Phosphatase: 70 U/L (ref 40–150)
Anion Gap: 8 mEq/L (ref 3–11)
BILIRUBIN TOTAL: 0.74 mg/dL (ref 0.20–1.20)
BUN: 15.1 mg/dL (ref 7.0–26.0)
CO2: 29 mEq/L (ref 22–29)
CREATININE: 0.9 mg/dL (ref 0.7–1.3)
Calcium: 8.6 mg/dL (ref 8.4–10.4)
Chloride: 102 mEq/L (ref 98–109)
GLUCOSE: 336 mg/dL — AB (ref 70–140)
Potassium: 5.1 mEq/L (ref 3.5–5.1)
Sodium: 138 mEq/L (ref 136–145)
Total Protein: 5.3 g/dL — ABNORMAL LOW (ref 6.4–8.3)

## 2014-11-23 LAB — CBC WITH DIFFERENTIAL/PLATELET
BASO%: 0.3 % (ref 0.0–2.0)
BASOS ABS: 0 10*3/uL (ref 0.0–0.1)
EOS ABS: 0 10*3/uL (ref 0.0–0.5)
EOS%: 0.3 % (ref 0.0–7.0)
HCT: 37 % — ABNORMAL LOW (ref 38.4–49.9)
HGB: 12 g/dL — ABNORMAL LOW (ref 13.0–17.1)
LYMPH%: 16.2 % (ref 14.0–49.0)
MCH: 30.3 pg (ref 27.2–33.4)
MCHC: 32.4 g/dL (ref 32.0–36.0)
MCV: 93.4 fL (ref 79.3–98.0)
MONO#: 0.4 10*3/uL (ref 0.1–0.9)
MONO%: 11.1 % (ref 0.0–14.0)
NEUT#: 2.4 10*3/uL (ref 1.5–6.5)
NEUT%: 72.1 % (ref 39.0–75.0)
Platelets: 70 10*3/uL — ABNORMAL LOW (ref 140–400)
RBC: 3.96 10*6/uL — ABNORMAL LOW (ref 4.20–5.82)
RDW: 17.9 % — ABNORMAL HIGH (ref 11.0–14.6)
WBC: 3.3 10*3/uL — AB (ref 4.0–10.3)
lymph#: 0.5 10*3/uL — ABNORMAL LOW (ref 0.9–3.3)

## 2014-11-23 MED ORDER — CEPHALEXIN 500 MG PO CAPS: 500.0000 mg | ORAL_CAPSULE | Freq: Four times a day (QID) | ORAL | Status: DC

## 2014-11-23 NOTE — Progress Notes (Signed)
will   SYMPTOM MANAGEMENT [facility]IC   HPI: Shawn Espinoza 73 y.o. male diagnosed with lung cancer.  Currently undergoing carboplatin/Alimta chemotherapy regimen.  Patient called the cancer Center today requesting an urgent care visit.  He states that he obtained a right shin abrasion approximately 10 days ago; and it is very slow to heal.  He states that he completed his slow taper of  Prednisone for treatment of interstitial pneumonitis approximate one week ago.  Since completing the prednisone he has noticed slight increase in dyspnea and fatigue.  He denies any chest pain, chest pressure, or pain with inspiration.  He denies any recent fevers or chills.  Also, patient stopped taking the Amaryl for diabetes when he completed the prednisone.  HPI  CURRENT THERAPY: Upcoming Treatment Dates - LUNG Pemetrexed (Alimta) / Carboplatin q21d Days with orders from any treatment category:  11/29/2014      SCHEDULING COMMUNICATION      CBC with Differential      Comprehensive metabolic panel      ondansetron (ZOFRAN) IVPB 16 mg      Dexamethasone Sodium Phosphate (DECADRON) injection 20 mg      CARBOplatin CHEMO intradermal Test Dose 100 mcg/0.68ml      PEMEtrexed (ALIMTA) 1,025 mg in sodium chloride 0.9 % 100 mL chemo infusion      CARBOplatin (PARAPLATIN) in sodium chloride 0.9 % 100 mL chemo infusion      sodium chloride 0.9 % injection 10 mL      heparin lock flush 100 unit/mL      heparin lock flush 100 unit/mL      alteplase (CATHFLO ACTIVASE) injection 2 mg      sodium chloride 0.9 % injection 3 mL      0.9 %  sodium chloride infusion      TREATMENT CONDITIONS 12/20/2014      [facility] SCHEDULING COMMUNICATION      CBC with Differential      Comprehensive metabolic panel      ondansetron (ZOFRAN) IVPB 16 mg      Dexamethasone Sodium Phosphate (DECADRON) injection 20 mg      CARBOplatin CHEMO intradermal Test Dose 100 mcg/0.32ml      PEMEtrexed (ALIMTA) 1,025 mg in sodium chloride  0.9 % 100 mL chemo infusion      CARBOplatin (PARAPLATIN) in sodium chloride 0.9 % 100 mL chemo infusion      sodium chloride 0.9 % injection 10 mL      heparin lock flush 100 unit/mL      heparin lock flush 100 unit/mL      alteplase (CATHFLO ACTIVASE) injection 2 mg      sodium chloride 0.9 % injection 3 mL      cyanocobalamin ((VITAMIN B-12)) injection 1,000 mcg      0.9 %  sodium chloride infusion      TREATMENT CONDITIONS 01/10/2015      [facility] SCHEDULING COMMUNICATION      CBC with Differential      Comprehensive metabolic panel      ondansetron (ZOFRAN) IVPB 16 mg      Dexamethasone Sodium Phosphate (DECADRON) injection 20 mg      CARBOplatin CHEMO intradermal Test Dose 100 mcg/0.61ml      PEMEtrexed (ALIMTA) 1,025 mg in sodium chloride 0.9 % 100 mL chemo infusion      CARBOplatin (PARAPLATIN) in sodium chloride 0.9 % 100 mL chemo infusion      sodium chloride 0.9 % injection 10 mL  heparin lock flush 100 unit/mL      heparin lock flush 100 unit/mL      alteplase (CATHFLO ACTIVASE) injection 2 mg      sodium chloride 0.9 % injection 3 mL      0.9 %  sodium chloride infusion      TREATMENT CONDITIONS    ROS  Past Medical History  Diagnosis Date  . History of diabetes mellitus, type II     resolved with diet, h/o neuropathy  . Diverticulosis of colon   . Hyperlipidemia   . Hypertension   . Fatty liver 02/29/00    abd ultrasound  . Lower back pain   . Systolic murmur 6440    2Decho - normal LV fxn, EF 55%, mild AS, biatrial enlargement  . History of tobacco use quit 1990s  . 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 Personal history of colonic adenomas 07/06/2013  . Hypertensive retinopathy of both eyes 2015    mild  . 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 Dysrhythmia     Atrial fib (found 07/2014)  . Shortness of  breath   . Pneumonia   . GERD (gastroesophageal reflux disease)     occasional  . Arthritis   . Neuropathy   . Constipation     Past Surgical History  Procedure Laterality Date  . Colonoscopy  2004  . Colonoscopy  2014    tubular adenoma x1, mod diverticulosis ([place])  . Flexible bronchoscopy  02/2014    WNL  . Video bronchoscopy Bilateral 03/17/2014    Procedure: VIDEO BRONCHOSCOPY WITH FLUORO;  Surgeon: Tanda Rockers, MD;  Location: [facility];  Service: Cardiopulmonary;  Laterality: Bilateral;  . Chest tube insertion Left 08/26/2014    Procedure: INSERTION OF LEFT  PLEURAL DRAINAGE CATHETER;  Surgeon: Ivin Poot, MD;  Location: [facility];  Service: Thoracic;  Laterality: Left;    has History of diabetes mellitus, type II; HYPERLIPIDEMIA; History of tobacco use; HYPERTENSION; HEMORRHOIDS; DIVERTICULOSIS, COLON; FATTY LIVER DISEASE; Medicare annual wellness visit, subsequent; Positive hepatitis C antibody test; Systolic murmur; Lower back pain; Polycythemia; BPH (benign prostatic hypertrophy); Right knee pain; Thrombocytopenia, unspecified; Personal history of colonic adenoma; DOE (dyspnea on exertion); Adenocarcinoma of left lung, stage 4; Liver lesion; Recurrent left pleural effusion; New onset atrial fibrillation; Cerumen debris on tympanic membrane; Skin rash; Bronchitis; Other pancytopenia; Hypoalbuminemia; Dehydration; Fatigue; Fever; Diarrhea; Interstitial pneumonitis; CHF (congestive heart failure); Abrasion of anterior right lower leg; and Thrombocytopenia on his problem list.     is allergic to candesartan cilexetil; diltiazem hcl; doxazosin mesylate; nifedipine; pravastatin; red yeast rice; rosuvastatin; and simvastatin.    Medication List       This list is accurate as of: 11/23/14 11:03 AM.  Always use your most recent med list.               albuterol 108 (90 BASE) MCG/ACT inhaler  Commonly known as:  PROVENTIL HFA;VENTOLIN HFA  Inhale 2 puffs into the lungs  every 6 (six) hours as needed for wheezing or shortness of breath.     amLODipine 5 MG tablet  Commonly known as:  NORVASC  Take 1 tablet (5 mg total) by mouth daily.     aspirin EC 325 MG tablet  Take 325 mg by mouth daily.  atenolol 25 MG tablet  Commonly known as:  TENORMIN  Take 1 tablet (25 mg total) by mouth 2 (two) times daily.     cephALEXin 500 MG capsule  Commonly known as:  KEFLEX  Take 1 capsule (500 mg total) by mouth 4 (four) times daily.     cetirizine 10 MG tablet  Commonly known as:  ZYRTEC  Take 10 mg by mouth daily as needed for allergies or rhinitis.     chlorpheniramine-HYDROcodone 10-8 MG/5ML Lqcr  Commonly known as:  TUSSIONEX  Take 5 mLs by mouth every 12 (twelve) hours as needed for cough.     cholecalciferol 1000 UNITS tablet  Commonly known as:  VITAMIN D  Take 1,000 Units by mouth every morning.     dexamethasone 4 MG tablet  Commonly known as:  DECADRON  4 mg by mouth twice a day the day before, day of and day after the chemotherapy every 3 weeks.     EYE VITAMINS PO  Take 1 capsule by mouth 2 (two) times daily. Due to macular degeneration     folic acid 1 MG tablet  Commonly known as:  FOLVITE  TAKE 1 TABLET BY MOUTH EVERY DAY     glimepiride 1 MG tablet  Commonly known as:  AMARYL  Take 1 tablet (1 mg total) by mouth as directed. Take on days you take decadron.     HYDROcodone-acetaminophen 5-325 MG per tablet  Commonly known as:  NORCO/VICODIN  Take 1 tablet by mouth every 6 (six) hours as needed for moderate pain.     ibuprofen 800 MG tablet  Commonly known as:  ADVIL,MOTRIN  Take 800 mg by mouth 2 (two) times daily as needed for moderate pain.     magic mouthwash w/lidocaine Soln  Take 5 mLs by mouth 3 (three) times daily as needed for mouth pain.     methocarbamol 500 MG tablet  Commonly known as:  ROBAXIN  Take 500 mg by mouth every 8 (eight) hours as needed (pain).     prochlorperazine 10 MG tablet  Commonly known as:   COMPAZINE  Take 1 tablet (10 mg total) by mouth every 6 (six) hours as needed for nausea or vomiting.     sucralfate 1 GM/10ML suspension  Commonly known as:  CARAFATE  Take 1 g by mouth 4 (four) times daily as needed (reflux).     tamsulosin 0.4 MG Caps capsule  Commonly known as:  FLOMAX  Take 0.4 mg by mouth at bedtime.         PHYSICAL EXAMINATION  Blood pressure 135/86, pulse 74, temperature 98.1 F (36.7 C), temperature source Oral, resp. rate 18, height 5\' 9"  (1.753 m), weight 192 lb 9.6 oz (87.363 kg).  Physical Exam  Constitutional: He is oriented to person, place, and time and well-developed, well-nourished, and in no distress.  HENT:  Head: Normocephalic and atraumatic.  Mouth/Throat: Oropharynx is clear and moist.  Eyes: Conjunctivae and EOM are normal. Pupils are equal, round, and reactive to light. Right eye exhibits no discharge. Left eye exhibits no discharge. No scleral icterus.  Neck: Normal range of motion. Neck supple. No JVD present. No tracheal deviation present. No thyromegaly present.  Cardiovascular: Normal rate, regular rhythm, normal heart sounds and intact distal pulses.   Pulmonary/Chest: Effort normal and breath sounds normal. No respiratory distress. He has no wheezes. He has no rales.  Abdominal: Soft. Bowel sounds are normal. He exhibits no distension. There is no tenderness. There is  no rebound.  Musculoskeletal: Normal range of motion. He exhibits edema. He exhibits no tenderness.  +1 edema to bilateral ankles.  Lymphadenopathy:    He has no cervical adenopathy.  Neurological: He is alert and oriented to person, place, and time. Gait normal.  Skin: Skin is warm and dry. No rash noted. No erythema.  Patient has an approximately 5 cm lateral abrasion to his right anterior shin.  There is some erythema surrounding abrasion; but no specific edema, purulent drainage, or red streaks.  There is also some tiny petechiae in the shape of the large Band-Aid  surrounding the abrasion.  Psychiatric: Affect normal.  Nursing note and vitals reviewed.   LABORATORY DATA:. Appointment on 11/23/2014  Component Date Value Ref Range Status  . WBC 11/23/2014 3.3* 4.0 - 10.3 10e3/uL Final  . NEUT# 11/23/2014 2.4  1.5 - 6.5 10e3/uL Final  . HGB 11/23/2014 12.0* 13.0 - 17.1 g/dL Final  . HCT 11/23/2014 37.0* 38.4 - 49.9 % Final  . Platelets 11/23/2014 70* 140 - 400 10e3/uL Final  . MCV 11/23/2014 93.4  79.3 - 98.0 fL Final  . MCH 11/23/2014 30.3  27.2 - 33.4 pg Final  . MCHC 11/23/2014 32.4  32.0 - 36.0 g/dL Final  . RBC 11/23/2014 3.96* 4.20 - 5.82 10e6/uL Final  . RDW 11/23/2014 17.9* 11.0 - 14.6 % Final  . lymph# 11/23/2014 0.5* 0.9 - 3.3 10e3/uL Final  . MONO# 11/23/2014 0.4  0.1 - 0.9 10e3/uL Final  . Eosinophils Absolute 11/23/2014 0.0  0.0 - 0.5 10e3/uL Final  . Basophils Absolute 11/23/2014 0.0  0.0 - 0.1 10e3/uL Final  . NEUT% 11/23/2014 72.1  39.0 - 75.0 % Final  . LYMPH% 11/23/2014 16.2  14.0 - 49.0 % Final  . MONO% 11/23/2014 11.1  0.0 - 14.0 % Final  . EOS% 11/23/2014 0.3  0.0 - 7.0 % Final  . BASO% 11/23/2014 0.3  0.0 - 2.0 % Final  . Sodium 11/23/2014 138  136 - 145 mEq/L Final  . Potassium 11/23/2014 5.1  3.5 - 5.1 mEq/L Final  . Chloride 11/23/2014 102  98 - 109 mEq/L Final  . CO2 11/23/2014 29  22 - 29 mEq/L Final  . Glucose 11/23/2014 336* 70 - 140 mg/dl Final  . BUN 11/23/2014 15.1  7.0 - 26.0 mg/dL Final  . Creatinine 11/23/2014 0.9  0.7 - 1.3 mg/dL Final  . Total Bilirubin 11/23/2014 0.74  0.20 - 1.20 mg/dL Final  . Alkaline Phosphatase 11/23/2014 70  40 - 150 U/L Final  . AST 11/23/2014 25  5 - 34 U/L Final  . ALT 11/23/2014 34  0 - 55 U/L Final  . Total Protein 11/23/2014 5.3* 6.4 - 8.3 g/dL Final  . Albumin 11/23/2014 3.0* 3.5 - 5.0 g/dL Final  . Calcium 11/23/2014 8.6  8.4 - 10.4 mg/dL Final  . Anion Gap 11/23/2014 8  3 - 11 mEq/L Final     RADIOGRAPHIC STUDIES: No results found.  ASSESSMENT/PLAN:    Abrasion  of anterior right lower leg Patient obtained a right anterior shin abrasion approximately 10 days ago but appears very slow to heal.  Area surrounding the abrasion noted to have some erythema; but no warmth, edema, purulent drainage, or red streaks.  Patient is also noted to have some petechiae at the bandage site as well.  Most likely, poor healing is secondary to history of diabetes and poor circulation; as well as chemotherapy.Will prescribe Keflex antibiotic treatment to hopefully aid in the healing process.  Shin abrasion was cleaned; and a new dressing applied while at the [place] today.  Also, patient was given some supplies to change the dressing at home.  Patient was encouraged to call or return if the abrasion does not heal.  Adenocarcinoma of left lung, stage 4 Patient received cycle one of his carboplatin/Alimta chemotherapy regimen on 11/08/2014.  He is scheduled to return next week on 11/29/2014 for cycle 2 of the same regimen.  History of diabetes mellitus, type II Patient has a history of diabetes.  Patient states that he was taking Amaryl when he was taking the prednisone due to her fluctuating blood sugars.  He has not been taking the Amaryl since he completed the prednisone taper.  Blood sugar today was 336.  Patient was advised to re the reinitiate the Amaryl as previously prescribed; and to check his blood sugars often.  He may very well need to follow back up with his primary care physician in regards to further diabetic management.  Thrombocytopenia Platelet count was 70 today.  Patient does report a history of a few different nosebleeds within the past few weeks.  He is also noted to have some mild bruising to his forearms bilaterally.  He he has some tiny petechiae in the shape of the bandage to his right anterior shin abrasion.  Patient stated he did recently follow up with his cardiologist for followup of his recently diagnosed atrial fibrillation.  Patient reports  that the cardiologist discontinued his Eliquis; and patient the decision to withhold his daily aspirin as well-due to the onset of some occasional nosebleeds.  Will continue to monitor closely.  Interstitial pneumonitis Patient was diagnosed and treated for interstitial pneumonitis with a slow prednisone taper.  Patient completed the prednisone taper approximate one week ago.  Patient states that he has been feeling slightly more short of breath and fatigue since completing the prednisone.  He denies any chest pain, chest pressure, or pain with inspiration.  He denies any recent fevers or chills.  Advised the patient to let us know if dyspnea or fatigue worsens.  Patient has plans to return next week for a followup visit prior to receiving cycle 2 of his chemotherapy.  Patient stated understanding of all instructions; and was in agreement with this plan of care. The patient knows to call the clinic with any problems, questions or concerns.   Review/collaboration with Dr. [name] regarding all aspects of patient's visit today.   Total time spent with patient was 25 minutes;  with greater than 75 percent of that time spent in face to face counseling regarding his symptoms, cleaning and dressing shin wound, and coordination of care and follow up.  Disclaimer: This note was dictated with voice recognition software. Similar sounding words can inadvertently be transcribed and may not be corrected upon review.   Drue Second, NP 11/23/2014

## 2014-11-23 NOTE — Assessment & Plan Note (Signed)
Patient was diagnosed and treated for interstitial pneumonitis with a slow prednisone taper.  Patient completed the prednisone taper approximate one week ago.  Patient states that he has been feeling slightly more short of breath and fatigue since completing the prednisone.  He denies any chest pain, chest pressure, or pain with inspiration.  He denies any recent fevers or chills.  Advised the patient to let us know if dyspnea or fatigue worsens.  Patient has plans to return next week for a followup visit prior to receiving cycle 2 of his chemotherapy.

## 2014-11-23 NOTE — Assessment & Plan Note (Signed)
Patient has a history of diabetes.  Patient states that he was taking Amaryl when he was taking the prednisone due to her fluctuating blood sugars.  He has not been taking the Amaryl since he completed the prednisone taper.  Blood sugar today was 336.  Patient was advised to re the reinitiate the Amaryl as previously prescribed; and to check his blood sugars often.  He may very well need to follow back up with his primary care physician in regards to further diabetic management.

## 2014-11-23 NOTE — Assessment & Plan Note (Signed)
Patient received cycle one of his carboplatin/Alimta chemotherapy regimen on 11/08/2014.  He is scheduled to return next week on 11/29/2014 for cycle 2 of the same regimen.

## 2014-11-23 NOTE — Assessment & Plan Note (Addendum)
Platelet count was 70 today.  Patient does report a history of a few different nosebleeds within the past few weeks.  He is also noted to have some mild bruising to his forearms bilaterally.  He he has some tiny petechiae in the shape of the bandage to his right anterior shin abrasion.  Patient stated he did recently follow up with his cardiologist for followup of his recently diagnosed atrial fibrillation.  Patient reports that the cardiologist discontinued his Eliquis; and patient the decision to withhold his daily aspirin as well-due to the onset of some occasional nosebleeds.  Will continue to monitor closely.

## 2014-11-23 NOTE — Assessment & Plan Note (Signed)
Patient obtained a right anterior shin abrasion approximately 10 days ago but appears very slow to heal.  Area surrounding the abrasion noted to have some erythema; but no warmth, edema, purulent drainage, or red streaks.  Patient is also noted to have some petechiae at the bandage site as well.  Most likely, poor healing is secondary to history of diabetes and poor circulation; as well as chemotherapy.Will prescribe Keflex antibiotic treatment to hopefully aid in the healing process.  Shin abrasion was cleaned; and a new dressing applied while at the [facility] today.  Also, patient was given some supplies to change the dressing at home.  Patient was encouraged to call or return if the abrasion does not heal.

## 2014-11-29 ENCOUNTER — Ambulatory Visit (HOSPITAL_BASED_OUTPATIENT_CLINIC_OR_DEPARTMENT_OTHER): Payer: Medicare Other

## 2014-11-29 ENCOUNTER — Encounter: Payer: Self-pay | Admitting: Physician Assistant

## 2014-11-29 ENCOUNTER — Ambulatory Visit (HOSPITAL_BASED_OUTPATIENT_CLINIC_OR_DEPARTMENT_OTHER): Payer: Medicare Other | Admitting: Physician Assistant

## 2014-11-29 ENCOUNTER — Telehealth: Payer: Self-pay | Admitting: Internal Medicine

## 2014-11-29 ENCOUNTER — Other Ambulatory Visit (HOSPITAL_BASED_OUTPATIENT_CLINIC_OR_DEPARTMENT_OTHER): Payer: Medicare Other

## 2014-11-29 VITALS — BP 133/76 | HR 75 | Temp 97.8°F | Resp 21 | Ht 69.0 in | Wt 195.7 lb

## 2014-11-29 DIAGNOSIS — C3412 Malignant neoplasm of upper lobe, left bronchus or lung: Secondary | ICD-10-CM

## 2014-11-29 DIAGNOSIS — C3492 Malignant neoplasm of unspecified part of left bronchus or lung: Secondary | ICD-10-CM

## 2014-11-29 DIAGNOSIS — J90 Pleural effusion, not elsewhere classified: Secondary | ICD-10-CM

## 2014-11-29 DIAGNOSIS — R29898 Other symptoms and signs involving the musculoskeletal system: Secondary | ICD-10-CM

## 2014-11-29 DIAGNOSIS — Z5111 Encounter for antineoplastic chemotherapy: Secondary | ICD-10-CM

## 2014-11-29 DIAGNOSIS — R0602 Shortness of breath: Secondary | ICD-10-CM

## 2014-11-29 DIAGNOSIS — I4891 Unspecified atrial fibrillation: Secondary | ICD-10-CM

## 2014-11-29 DIAGNOSIS — C3490 Malignant neoplasm of unspecified part of unspecified bronchus or lung: Secondary | ICD-10-CM

## 2014-11-29 DIAGNOSIS — R42 Dizziness and giddiness: Secondary | ICD-10-CM

## 2014-11-29 LAB — CBC WITH DIFFERENTIAL/PLATELET
BASO%: 0.5 % (ref 0.0–2.0)
Basophils Absolute: 0 10*3/uL (ref 0.0–0.1)
EOS%: 0 % (ref 0.0–7.0)
Eosinophils Absolute: 0 10*3/uL (ref 0.0–0.5)
HEMATOCRIT: 37.4 % — AB (ref 38.4–49.9)
HGB: 12 g/dL — ABNORMAL LOW (ref 13.0–17.1)
LYMPH#: 0.3 10*3/uL — AB (ref 0.9–3.3)
LYMPH%: 6.9 % — ABNORMAL LOW (ref 14.0–49.0)
MCH: 30.1 pg (ref 27.2–33.4)
MCHC: 32.1 g/dL (ref 32.0–36.0)
MCV: 93.7 fL (ref 79.3–98.0)
MONO#: 0.3 10*3/uL (ref 0.1–0.9)
MONO%: 6.3 % (ref 0.0–14.0)
NEUT#: 3.7 10*3/uL (ref 1.5–6.5)
NEUT%: 86.3 % — AB (ref 39.0–75.0)
Platelets: 225 10*3/uL (ref 140–400)
RBC: 3.99 10*6/uL — ABNORMAL LOW (ref 4.20–5.82)
RDW: 20.1 % — ABNORMAL HIGH (ref 11.0–14.6)
WBC: 4.3 10*3/uL (ref 4.0–10.3)

## 2014-11-29 LAB — COMPREHENSIVE METABOLIC PANEL (CC13)
ALBUMIN: 3.2 g/dL — AB (ref 3.5–5.0)
ALT: 27 U/L (ref 0–55)
AST: 19 U/L (ref 5–34)
Alkaline Phosphatase: 69 U/L (ref 40–150)
Anion Gap: 13 mEq/L — ABNORMAL HIGH (ref 3–11)
BUN: 17.1 mg/dL (ref 7.0–26.0)
CALCIUM: 8.9 mg/dL (ref 8.4–10.4)
CHLORIDE: 103 meq/L (ref 98–109)
CO2: 22 meq/L (ref 22–29)
Creatinine: 0.9 mg/dL (ref 0.7–1.3)
EGFR: 81 mL/min/{1.73_m2} — ABNORMAL LOW (ref >90)
Glucose: 289 mg/dl — ABNORMAL HIGH (ref 70–140)
Potassium: 4.5 mEq/L (ref 3.5–5.1)
Sodium: 138 mEq/L (ref 136–145)
TOTAL PROTEIN: 5.8 g/dL — AB (ref 6.4–8.3)
Total Bilirubin: 0.65 mg/dL (ref 0.20–1.20)

## 2014-11-29 MED ORDER — DEXAMETHASONE SODIUM PHOSPHATE 20 MG/5ML IJ SOLN
20.0000 mg | Freq: Once | INTRAMUSCULAR | Status: AC
  Administered 2014-11-29: 20 mg via INTRAVENOUS

## 2014-11-29 MED ORDER — SODIUM CHLORIDE 0.9 % IV SOLN
493.0000 mg/m2 | Freq: Once | INTRAVENOUS | Status: AC
  Administered 2014-11-29: 1000 mg via INTRAVENOUS
  Filled 2014-11-29: qty 40

## 2014-11-29 MED ORDER — CARBOPLATIN CHEMO INTRADERMAL TEST DOSE 100MCG/0.02ML
100.0000 ug | Freq: Once | INTRADERMAL | Status: AC
  Administered 2014-11-29: 100 ug via INTRADERMAL
  Filled 2014-11-29: qty 0.01

## 2014-11-29 MED ORDER — SODIUM CHLORIDE 0.9 % IV SOLN
500.0000 mg | Freq: Once | INTRAVENOUS | Status: AC
  Administered 2014-11-29: 500 mg via INTRAVENOUS
  Filled 2014-11-29: qty 50

## 2014-11-29 MED ORDER — SODIUM CHLORIDE 0.9 % IV SOLN
Freq: Once | INTRAVENOUS | Status: AC
  Administered 2014-11-29: 12:00:00 via INTRAVENOUS

## 2014-11-29 MED ORDER — DEXAMETHASONE SODIUM PHOSPHATE 20 MG/5ML IJ SOLN
INTRAMUSCULAR | Status: AC
  Filled 2014-11-29: qty 5

## 2014-11-29 MED ORDER — ONDANSETRON 16 MG/50ML IVPB (CHCC)
16.0000 mg | Freq: Once | INTRAVENOUS | Status: AC
  Administered 2014-11-29: 16 mg via INTRAVENOUS

## 2014-11-29 MED ORDER — ONDANSETRON 16 MG/50ML IVPB (CHCC)
INTRAVENOUS | Status: AC
  Filled 2014-11-29: qty 16

## 2014-11-29 NOTE — Patient Instructions (Signed)
Continue labs and chemotherapy as scheduled You are being scheduled for a MRI of your thoracic and lumbar spine to evaluate your complaints of pain and weakness in your legs and difficulty lifting your legs Follow up in 3 weeks, prior to your next scheduled cycle of chemotherapy

## 2014-11-29 NOTE — Progress Notes (Addendum)
[place] [phone]   Fax:([phone]) [place], [place] [address]  DIAGNOSIS: Stage IIIB/IV non-small cell lung cancer, adenocarcinoma diagnosed in March of 2015.  PRIOR THERAPY:  1) Systemic chemotherapy with carboplatin for AUC of 5 and Alimta 500 mg/M2 every 3 weeks. First dose expected on 04/13/2014. Status post 6 cycles. 2)  Maintenance chemotherapy with single agent Alimta 500 mg/M2 every 3 weeks. First cycle on 09/06/2014. Status post 1 cycle.  CURRENT THERAPY: Second course of systemic chemotherapy with carboplatin for AUC of 5 and Alimta 500 MG/M2 every 3 weeks. First cycle 11/08/2014. Status post 1 cycle  INTERVAL HISTORY: Shawn Espinoza 73 y.o. male returns to the clinic today for follow up visit. He is status post Pleurx catheter drainage on the left side on 08/26/2014. Currently he is drainingg the Pleurex on Mondays and Thursdays. He is averaging about 700 ml each time. He reports swelling in his legs from the ankle sometimes to the level of the thigh. He admits to increased salt in his diet in an attempt to be able to "taste" his food.  He continues to have some dizzy spells especially with change of position. He complains of several episodes of posterior thigh muscle and buttock thigh muscle weakness and pain and difficulty lifting his legs. He reports Dr. Prescott Gum took him off of the Eliquis and on placed him on a 325 mg aspirin. He states that he was on the prednisone at same time that he started the 325 mg aspirin restarted having nosebleeds. He discontinued the aspirin but has not restarted the aspirin after completing the prednisone. I recommended that he call Dr. Lucianne Lei Trigt's office regarding whether he should resume the aspirin at this point. He denied having any significant chest pain but continues to have shortness of breath and mild cough with no hemoptysis. He has no  significant fever or chills, no nausea or vomiting.  MEDICAL HISTORY: Past Medical History  Diagnosis Date  . History of diabetes mellitus, type II     resolved with diet, h/o neuropathy  . Diverticulosis of colon   . Hyperlipidemia   . Hypertension   . Fatty liver 02/29/00    abd ultrasound  . Lower back pain   . Systolic murmur 7867    2Decho - normal LV fxn, EF 55%, mild AS, biatrial enlargement  . History of tobacco use quit 1990s  . 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 Personal history of colonic adenomas 07/06/2013  . Hypertensive retinopathy of both eyes 2015    mild  . 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 Dysrhythmia     Atrial fib (found 07/2014)  . Shortness of breath   . Pneumonia   . GERD (gastroesophageal reflux disease)     occasional  . Arthritis   . Neuropathy   . Constipation     ALLERGIES:  is allergic to candesartan cilexetil; diltiazem hcl; doxazosin mesylate; nifedipine; pravastatin; red yeast rice; rosuvastatin; and simvastatin.  MEDICATIONS:  Current Outpatient Prescriptions  Medication Sig Dispense Refill  . albuterol (PROVENTIL HFA;VENTOLIN HFA) 108 (90 BASE) MCG/ACT inhaler Inhale 2 puffs into the lungs every 6 (six) hours as needed for wheezing or shortness of breath.  1 Inhaler 2  . Alum & Mag Hydroxide-Simeth (MAGIC MOUTHWASH W/LIDOCAINE) SOLN Take 5 mLs by mouth 3 (three) times daily as needed for mouth pain. 240 mL 0  . amLODipine (NORVASC) 5 MG tablet Take 1 tablet (5 mg total) by mouth daily. 90 tablet 2  . atenolol (TENORMIN) 25 MG tablet Take 1 tablet (25 mg total) by mouth 2 (two) times daily. 180 tablet 2  . cephALEXin (KEFLEX) 500 MG capsule Take 1 capsule (500 mg total) by mouth 4 (four) times daily. 40 capsule 0  . cetirizine (ZYRTEC) 10 MG tablet Take 10 mg by mouth daily as needed for  allergies or rhinitis.     . chlorpheniramine-HYDROcodone (TUSSIONEX) 10-8 MG/5ML LQCR Take 5 mLs by mouth every 12 (twelve) hours as needed for cough. 115 mL 0  . dexamethasone (DECADRON) 4 MG tablet 4 mg by mouth twice a day the day before, day of and day after the chemotherapy every 3 weeks. 40 tablet 1  . folic acid (FOLVITE) 1 MG tablet TAKE 1 TABLET BY MOUTH EVERY DAY 30 tablet 4  . glimepiride (AMARYL) 1 MG tablet Take 1 tablet (1 mg total) by mouth as directed. Take on days you take decadron. 30 tablet 0  . ibuprofen (ADVIL,MOTRIN) 800 MG tablet Take 800 mg by mouth 2 (two) times daily as needed for moderate pain.    . methocarbamol (ROBAXIN) 500 MG tablet Take 500 mg by mouth every 8 (eight) hours as needed (pain).     . Multiple Vitamins-Minerals (EYE VITAMINS PO) Take 1 capsule by mouth 2 (two) times daily. Due to macular degeneration    . prochlorperazine (COMPAZINE) 10 MG tablet Take 1 tablet (10 mg total) by mouth every 6 (six) hours as needed for nausea or vomiting. 60 tablet 0  . sucralfate (CARAFATE) 1 GM/10ML suspension Take 1 g by mouth 4 (four) times daily as needed (reflux).    . tamsulosin (FLOMAX) 0.4 MG CAPS capsule Take 0.4 mg by mouth at bedtime.  2  . aspirin EC 325 MG tablet Take 325 mg by mouth daily.    . cholecalciferol (VITAMIN D) 1000 UNITS tablet Take 1,000 Units by mouth every morning.     Marland Kitchen HYDROcodone-acetaminophen (NORCO/VICODIN) 5-325 MG per tablet Take 1 tablet by mouth every 6 (six) hours as needed for moderate pain.     No current facility-administered medications for this visit.    SURGICAL HISTORY:  Past Surgical History  Procedure Laterality Date  . Colonoscopy  2004  . Colonoscopy  2014    tubular adenoma x1, mod diverticulosis ([facility])  . Flexible bronchoscopy  02/2014    WNL  . Video bronchoscopy Bilateral 03/17/2014    Procedure: VIDEO BRONCHOSCOPY WITH FLUORO;  Surgeon: Tanda Rockers, MD;  Location: [facility];  Service: Cardiopulmonary;   Laterality: Bilateral;  . Chest tube insertion Left 08/26/2014    Procedure: INSERTION OF LEFT  PLEURAL DRAINAGE CATHETER;  Surgeon: Ivin Poot, MD;  Location: [facility];  Service: Thoracic;  Laterality: Left;    REVIEW OF SYSTEMS:  Constitutional: positive for fatigue Eyes: negative Ears, nose, mouth, throat, and face: negative Respiratory: positive for cough and dyspnea on exertion Cardiovascular: negative Gastrointestinal: negative Genitourinary:negative Integument/breast: negative Hematologic/lymphatic: negative Musculoskeletal:negative Neurological: positive for dizziness, gait problems, weakness and pain and weakness in the thigh and buttock muscles with difficulty lifting legs Behavioral/Psych: negative Endocrine: negative Allergic/Immunologic: negative   PHYSICAL EXAMINATION: General appearance: alert, cooperative, fatigued and no distress Head: Normocephalic, without obvious  abnormality, atraumatic Neck: no adenopathy, no JVD, supple, symmetrical, trachea midline and thyroid not enlarged, symmetric, no tenderness/mass/nodules Lymph nodes: Cervical, supraclavicular, and axillary nodes normal. Resp: diminished breath sounds LLL and dullness to percussion LLL Back: symmetric, no curvature. ROM normal. No CVA tenderness. Cardio: regular rate and rhythm, S1, S2 normal, no murmur, click, rub or gallop GI: soft, non-tender; bowel sounds normal; no masses,  no organomegaly Extremities: edema 1+ - 2+ pitting edema from ankle to just below knee bilaterally, trace pitting edema at thighs Neurologic: Grossly normal  ECOG PERFORMANCE STATUS: 1 - Symptomatic but completely ambulatory  Blood pressure 133/76, pulse 75, temperature 97.8 F (36.6 C), temperature source Oral, resp. rate 21, height 5\' 9"  (1.753 m), weight 195 lb 11.2 oz (88.769 kg), SpO2 100 %.  LABORATORY DATA: Lab Results  Component Value Date   WBC 4.3 11/29/2014   HGB 12.0* 11/29/2014   HCT 37.4* 11/29/2014   MCV  93.7 11/29/2014   PLT 225 11/29/2014      Chemistry      Component Value Date/Time   NA 138 11/29/2014 1026   NA 140 08/26/2014 1246   K 4.5 11/29/2014 1026   K 4.5 08/26/2014 1246   CL 100 08/26/2014 1246   CO2 22 11/29/2014 1026   CO2 29 08/26/2014 1246   BUN 17.1 11/29/2014 1026   BUN 16 08/26/2014 1246   CREATININE 0.9 11/29/2014 1026   CREATININE 0.76 08/26/2014 1246   CREATININE 0.83 02/22/2014 1314      Component Value Date/Time   CALCIUM 8.9 11/29/2014 1026   CALCIUM 8.7 08/26/2014 1246   ALKPHOS 69 11/29/2014 1026   ALKPHOS 68 08/13/2014 1222   AST 19 11/29/2014 1026   AST 21 08/13/2014 1222   ALT 27 11/29/2014 1026   ALT 15 08/13/2014 1222   BILITOT 0.65 11/29/2014 1026   BILITOT 1.3* 08/13/2014 1222       RADIOGRAPHIC STUDIES: Ct Chest W Contrast  11/07/2014   CLINICAL DATA:  F/u NSCL CA dx 02/2014; chemo maintenance; no XRT;hx of Hep C;some cough, no other complaints; Patient stated that he had 525 mL fluid drawn off this weekend of his lung.  EXAM: CT CHEST, ABDOMEN, AND PELVIS WITH CONTRAST  TECHNIQUE: Multidetector CT imaging of the chest, abdomen and pelvis was performed following the standard protocol during bolus administration of intravenous contrast.  CONTRAST:  112mL OMNIPAQUE IOHEXOL 300 MG/ML  SOLN  COMPARISON:  Chest CT, 10/07/2014. Abdomen and pelvis CT, 08/15/2014.  FINDINGS: CT CHEST FINDINGS  Minimal left pleural effusion.  No right pleural effusion.  Focal area pleural based opacity in the lateral left lower lobe currently measures 13 mm by 6.6 mm, previously 12 mm x 9 mm. This is in close proximity to the placement of a recent prior left chest tube. It is likely atelectasis. There is minimal dependent subsegmental atelectasis and/ or scarring. No other focal lung opacities. No evidence of pneumonia. Mild changes of centrilobular emphysema are noted in the upper lobes.  Heart is normal in size and configuration. Great vessels are normal in caliber. No  neck base, axillary mediastinal or hilar masses or pathologically enlarged lymph nodes. Lower esophageal wall appears mildly thickened as it did on the prior study. Consider esophagitis if there are any symptoms of dysphagia. No evidence of a discrete esophageal mass.  CT ABDOMEN AND PELVIS FINDINGS  There is a mixed attenuation mass in the right lobe of the liver at the dome, mostly low attenuation with hazy internal  areas of intermediate attenuation and a rim of delayed mild enhancement. This lesion currently measures 5.7 cm by 4.7 cm x 5 cm, previously 4.9 cm x 4.2 cm x 4.2 cm.  No other liver masses or lesions. Liver is diffusely hypoattenuating consistent with fatty infiltration.  Spleen, gallbladder and pancreas are unremarkable.  No adrenal masses.  Normal kidneys, ureters and bladder.  Prominent to mildly enlarged gastrohepatic ligament lymph nodes. Largest measures 13 mm in short axis. These are stable. No other adenopathy. No ascites.  There are scattered left colon diverticula. No diverticulitis. Colon otherwise unremarkable. Normal small bowel. Normal appendix.  Atherosclerotic calcifications are noted along the abdominal aorta and its branch vessels. There is a short dissection of the proximal left external iliac artery, stable.  MUSCULOSKELETAL:  No osteoblastic or osteolytic lesions.  IMPRESSION: 1. No acute findings. 2. Minimal amount of residual left pleural fluid. No right pleural effusion. No evidence of pneumonia or edema. 3. No evidence of locally recurrent lung carcinoma the chest or of metastatic disease in the chest. 4. Single hepatic metastatic lesion at the dome of the right lobe is larger than on the prior study, currently 5.7 cm x 4.7 cm x 5 cm, previously 4.9 cm x 4.2 cm x 4.2 cm. No new liver lesions. 5. No other evidence of metastatic disease below the diaphragm. No evidence of bony metastatic disease.   Electronically Signed   By: Lajean Manes M.D.   On: 11/07/2014 18:02   Ct  Abdomen Pelvis W Contrast  11/07/2014   CLINICAL DATA:  F/u NSCL CA dx 02/2014; chemo maintenance; no XRT;hx of Hep C;some cough, no other complaints; Patient stated that he had 525 mL fluid drawn off this weekend of his lung.  EXAM: CT CHEST, ABDOMEN, AND PELVIS WITH CONTRAST  TECHNIQUE: Multidetector CT imaging of the chest, abdomen and pelvis was performed following the standard protocol during bolus administration of intravenous contrast.  CONTRAST:  149mL OMNIPAQUE IOHEXOL 300 MG/ML  SOLN  COMPARISON:  Chest CT, 10/07/2014. Abdomen and pelvis CT, 08/15/2014.  FINDINGS: CT CHEST FINDINGS  Minimal left pleural effusion.  No right pleural effusion.  Focal area pleural based opacity in the lateral left lower lobe currently measures 13 mm by 6.6 mm, previously 12 mm x 9 mm. This is in close proximity to the placement of a recent prior left chest tube. It is likely atelectasis. There is minimal dependent subsegmental atelectasis and/ or scarring. No other focal lung opacities. No evidence of pneumonia. Mild changes of centrilobular emphysema are noted in the upper lobes.  Heart is normal in size and configuration. Great vessels are normal in caliber. No neck base, axillary mediastinal or hilar masses or pathologically enlarged lymph nodes. Lower esophageal wall appears mildly thickened as it did on the prior study. Consider esophagitis if there are any symptoms of dysphagia. No evidence of a discrete esophageal mass.  CT ABDOMEN AND PELVIS FINDINGS  There is a mixed attenuation mass in the right lobe of the liver at the dome, mostly low attenuation with hazy internal areas of intermediate attenuation and a rim of delayed mild enhancement. This lesion currently measures 5.7 cm by 4.7 cm x 5 cm, previously 4.9 cm x 4.2 cm x 4.2 cm.  No other liver masses or lesions. Liver is diffusely hypoattenuating consistent with fatty infiltration.  Spleen, gallbladder and pancreas are unremarkable.  No adrenal masses.  Normal  kidneys, ureters and bladder.  Prominent to mildly enlarged gastrohepatic ligament lymph nodes. Largest  measures 13 mm in short axis. These are stable. No other adenopathy. No ascites.  There are scattered left colon diverticula. No diverticulitis. Colon otherwise unremarkable. Normal small bowel. Normal appendix.  Atherosclerotic calcifications are noted along the abdominal aorta and its branch vessels. There is a short dissection of the proximal left external iliac artery, stable.  MUSCULOSKELETAL:  No osteoblastic or osteolytic lesions.  IMPRESSION: 1. No acute findings. 2. Minimal amount of residual left pleural fluid. No right pleural effusion. No evidence of pneumonia or edema. 3. No evidence of locally recurrent lung carcinoma the chest or of metastatic disease in the chest. 4. Single hepatic metastatic lesion at the dome of the right lobe is larger than on the prior study, currently 5.7 cm x 4.7 cm x 5 cm, previously 4.9 cm x 4.2 cm x 4.2 cm. No new liver lesions. 5. No other evidence of metastatic disease below the diaphragm. No evidence of bony metastatic disease.   Electronically Signed   By: Lajean Manes M.D.   On: 11/07/2014 18:02   ASSESSMENT AND PLAN: this is a very pleasant 73 years old white male with:  1) Stage IV non-small cell lung cancer, adenocarcinoma presenting with large right upper lobe lung mass in addition to mediastinal and bilateral hilar lymphadenopathy as well as cervical lymphadenopathy and left pleural effusion. The patient completed a course systemic chemotherapy with carboplatin and Alimta status post 6 cycles. This was followed by 3 cycles of maintenance chemotherapy with single agent Alimta. The patient tolerated his treatment well. His recent CT scan of the chest, abdomen and pelvis showed increase in the size of the solitary liver lesion. His disease in the chest has been stable with improvement of the left pleural effusion. The patient was started again on systemic  chemotherapy with carboplatin for AUC of 5 and Alimta at 500 mg/m2, status post 1 cycle. The patient was discussed with and also seen by Dr. [name]. He will proceed with cycle #2 of his resumed chemotherapy with carboplatin and Alimta. To further evaluate his complaints of episodes of  pain and weakness in his thigh and buttock muscles with difficulty lifting his legs, he will be scheduled for a MRI of the thoracic and lumbar spine looking for metastatic spread and/or spinal cord compromise. Ideally this study will be done this week.   2) Recurrent left pleural effusion: He is status post Pleurx catheter placement. Continue drainage of the pleural fluid 2 times a week.   3) Atrial fibrillation: patient states he was taken off Eliquis and started on a 325 mg aspirin. As stated above the patient discontinued the aspirin when he developed nosebleeds while on prednisone. He completed prednisone but did not resume the aspirin. Patient is asked to contact Dr. Lucianne Lei Trigt's office regarding resuming the aspirin.  4) Dizziness: likely multifactorial. We will continue to monitor this closely. He is encouraged to increase his by mouth intake of food and fluids.  He will continue weekly labs as scheduled and follow-up in 3 weeks prior to the start of cycle #3. Should the MRI reveal any concerning signs or symptoms that require radiation oncology intervention he will be referred as indicated.   He was advised to call immediately if he has any concerning symptoms in the interval.  The patient voices understanding of current disease status and treatment options and is in agreement with the current care plan.  All questions were answered. The patient knows to call the clinic with any problems, questions or concerns.  We can certainly see the patient much sooner if necessary.  Carlton Adam, PA-C 11/29/2014  ADDENDUM: Hematology/Oncology Attending: I had a face to face encounter with the patient today. I  recommended his care plan. This is a very pleasant 74 years old white male with metastatic non-small cell lung cancer, adenocarcinoma who is currently undergoing his second course of systemic chemotherapy with carboplatin and Alimta status post 1 cycle. He tolerated the first cycle of his treatment fairly well. The patient complains today of pain on the lower back as well as lower extremities with difficulty lifting his right leg.  I recommended for the patient to continue his current treatment as a scheduled. For the low back pain we will order MRI of the thoracolumbar spine to rule out any metastatic disease or spinal cord involvement. The patient would come back for follow-up visit in 3 weeks with the start of cycle #3 unless there is any significant abnormalities in his MRI he would be seen sooner. He was advised to call immediately if he has any concerning symptoms in the interval.  Disclaimer: This note was dictated with voice recognition software. Similar sounding words can inadvertently be transcribed and may not be corrected upon review. Eilleen Kempf., MD 11/29/2014

## 2014-11-29 NOTE — Patient Instructions (Signed)
[facility] Discharge Instructions for Patients Receiving Chemotherapy  Today you received the following chemotherapy agents Alimta/Carboplatin  To help prevent nausea and vomiting after your treatment, we encourage you to take your nausea medication   If you develop nausea and vomiting that is not controlled by your nausea medication, call the clinic.   BELOW ARE SYMPTOMS THAT SHOULD BE REPORTED IMMEDIATELY:  *FEVER GREATER THAN 100.5 F  *CHILLS WITH OR WITHOUT FEVER  NAUSEA AND VOMITING THAT IS NOT CONTROLLED WITH YOUR NAUSEA MEDICATION  *UNUSUAL SHORTNESS OF BREATH  *UNUSUAL BRUISING OR BLEEDING  TENDERNESS IN MOUTH AND THROAT WITH OR WITHOUT PRESENCE OF ULCERS  *URINARY PROBLEMS  *BOWEL PROBLEMS  UNUSUAL RASH Items with * indicate a potential emergency and should be followed up as soon as possible.  Feel free to call the clinic you have any questions or concerns. The clinic phone number is [phone].

## 2014-11-29 NOTE — Telephone Encounter (Signed)
Pt confirmed labs/ov per 12/08 POF, gave pt AVS.... KJ

## 2014-12-01 ENCOUNTER — Encounter: Payer: Self-pay | Admitting: Family Medicine

## 2014-12-01 ENCOUNTER — Encounter: Payer: Self-pay | Admitting: *Deleted

## 2014-12-01 ENCOUNTER — Ambulatory Visit (INDEPENDENT_AMBULATORY_CARE_PROVIDER_SITE_OTHER): Payer: Medicare Other | Admitting: Family Medicine

## 2014-12-01 VITALS — BP 106/62 | HR 83 | Temp 97.9°F | Wt 189.0 lb

## 2014-12-01 DIAGNOSIS — Z8639 Personal history of other endocrine, nutritional and metabolic disease: Secondary | ICD-10-CM

## 2014-12-01 DIAGNOSIS — R600 Localized edema: Secondary | ICD-10-CM

## 2014-12-01 DIAGNOSIS — C3492 Malignant neoplasm of unspecified part of left bronchus or lung: Secondary | ICD-10-CM

## 2014-12-01 DIAGNOSIS — R609 Edema, unspecified: Secondary | ICD-10-CM | POA: Insufficient documentation

## 2014-12-01 DIAGNOSIS — J90 Pleural effusion, not elsewhere classified: Secondary | ICD-10-CM

## 2014-12-01 DIAGNOSIS — E46 Unspecified protein-calorie malnutrition: Secondary | ICD-10-CM

## 2014-12-01 DIAGNOSIS — D696 Thrombocytopenia, unspecified: Secondary | ICD-10-CM

## 2014-12-01 NOTE — Assessment & Plan Note (Addendum)
Anticipate multifactorial - protein-calorie malnutrition, possible venous insuff, current chemo treatment, known liver lesion.  I recommended continued avoidance of salt/sodium in diet, increased water and continued elevation of legs and compression stocking use.  Actually bp somewhat soft, so will hold flomax while starting lasix.  Restart lasix 10mg  daily. Monitor weights and pedal edema.  Reviewed recent echo 07/2014 with preserved EF - doubt CHF related, but if persistent sxs consider rpt echo.

## 2014-12-01 NOTE — Assessment & Plan Note (Signed)
Continue pleurx catheter drainage M/Th

## 2014-12-01 NOTE — Progress Notes (Signed)
Pre visit review using our clinic review tool, if applicable. No additional management support is needed unless otherwise documented below in the visit note. 

## 2014-12-01 NOTE — Patient Instructions (Signed)
Check dose of lasix at home - ensure 20mg  dose. May try 1/2-1 tablet for leg swelling. Hold flomax for now. Drink good water, avoid high salt/sodium diet, elevate legs and continue compression stockings.

## 2014-12-01 NOTE — Addendum Note (Signed)
Addended by: [name] on: 12/01/2014 11:36 PM   Modules accepted: Level of Service

## 2014-12-01 NOTE — Assessment & Plan Note (Signed)
Reviewed with patient

## 2014-12-01 NOTE — Assessment & Plan Note (Addendum)
H/o diabetes now anticipate steroid induced diabetes. Advise take amaryl regularly when on systemic steroid peri-chemotherapy. Lab Results  Component Value Date   HGBA1C 5.4 03/03/2013  due for A1c.

## 2014-12-01 NOTE — Progress Notes (Signed)
 BP 106/62 mmHg  Pulse 83  Temp(Src) 97.9 F (36.6 C) (Oral)  Wt 189 lb (85.73 kg)  SpO2 97%   CC: swelling below waist, gained 10 lbs  Subjective:    Patient ID: Shawn Espinoza, male    DOB: 04/23/1941, 73 y.o.   MRN: [id number]  HPI: Shawn Espinoza is a 73 y.o. 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 Has completed regular chemo, planning maintenance chemo therapy, liver lesion has grown and pt has now restarted chemo. L pleurX cath placed 08/26/2014 for recurrent malignant pleural effusions ([facility]).   Lasix was started for ankle swelling noted over last several months. This caused significant urgency/frequency. Lasix was stopped and flomax was started. This is when pedal edema started. 15 lb weight gain noted since Thanksgiving (175 - 190 lbs). Endorses intermittent dyspnea with leg swelling. Denies chest pain. Known h/o moderate aortic stenosis, severe LA dilation, with EF 55-60% (last echo 07/2014). Dyspnea improves with pleur-x drain. Staying dizzy with standing  Has been on prednisone which has led to elevated sugars. He has not been regular with glimepiride.    Relevant past medical, surgical, family and social history reviewed and updated as indicated. Interim medical history since our last visit reviewed. Allergies and medications reviewed and updated.  Current Outpatient Prescriptions on File Prior to Visit  Medication Sig  . albuterol (PROVENTIL HFA;VENTOLIN HFA) 108 (90 BASE) MCG/ACT inhaler Inhale 2 puffs into the lungs every 6 (six) hours as needed for wheezing or shortness of breath.  . Alum & Mag Hydroxide-Simeth (MAGIC MOUTHWASH W/LIDOCAINE) SOLN Take 5 mLs by mouth 3 (three) times daily as needed for mouth pain.  . amLODipine (NORVASC) 5 MG tablet Take 1 tablet (5 mg total) by mouth daily.  . aspirin EC 325 MG tablet Take 325 mg by mouth  daily.  . atenolol (TENORMIN) 25 MG tablet Take 1 tablet (25 mg total) by mouth 2 (two) times daily.  . cephALEXin (KEFLEX) 500 MG capsule Take 1 capsule (500 mg total) by mouth 4 (four) times daily.  . cetirizine (ZYRTEC) 10 MG tablet Take 10 mg by mouth daily as needed for allergies or rhinitis.   . chlorpheniramine-HYDROcodone (TUSSIONEX) 10-8 MG/5ML LQCR Take 5 mLs by mouth every 12 (twelve) hours as needed for cough.  . dexamethasone (DECADRON) 4 MG tablet 4 mg by mouth twice a day the day before, day of and day after the chemotherapy every 3 weeks.  . folic acid (FOLVITE) 1 MG tablet TAKE 1 TABLET BY MOUTH EVERY DAY  . glimepiride (AMARYL) 1 MG tablet Take 1 tablet (1 mg total) by mouth as directed. Take on days you take decadron.  . HYDROcodone-acetaminophen (NORCO/VICODIN) 5-325 MG per tablet Take 1 tablet by mouth every 6 (six) hours as needed for moderate pain.  . ibuprofen (ADVIL,MOTRIN) 800 MG tablet Take 800 mg by mouth 2 (two) times daily as needed for moderate pain.  . methocarbamol (ROBAXIN) 500 MG tablet Take 500 mg by mouth every 8 (eight) hours as needed (pain).   . Multiple Vitamins-Minerals (EYE VITAMINS PO) Take 1 capsule by mouth 2 (two) times daily. Due to macular degeneration  . prochlorperazine (COMPAZINE) 10 MG tablet Take 1 tablet (10 mg total) by mouth every 6 (six) hours as needed for nausea or vomiting.  . sucralfate (CARAFATE) 1 GM/10ML suspension Take 1 g by mouth 4 (four) times   daily as needed (reflux).  . tamsulosin (FLOMAX) 0.4 MG CAPS capsule Take 0.4 mg by mouth at bedtime.   No current facility-administered medications on file prior to visit.    Review of Systems Per HPI unless specifically indicated above     Objective:    BP 106/62 mmHg  Pulse 83  Temp(Src) 97.9 F (36.6 C) (Oral)  Wt 189 lb (85.73 kg)  SpO2 97%  Wt Readings from Last 3 Encounters:  12/01/14 189 lb (85.73 kg)  11/29/14 195 lb 11.2 oz (88.769 kg)  11/23/14 192 lb 9.6 oz  (87.363 kg)    Physical Exam  Constitutional: He appears well-developed and well-nourished. No distress.  HENT:  Mouth/Throat: Oropharynx is clear and moist. No oropharyngeal exudate.  Cardiovascular: Normal rate, regular rhythm, normal heart sounds and intact distal pulses.   No murmur heard. Pulmonary/Chest: Effort normal. No respiratory distress. He has decreased breath sounds in the left lower field. He has no wheezes. He has no rhonchi. He has no rales.  Abdominal: Soft. Bowel sounds are normal. He exhibits no distension and no mass. There is no tenderness. There is no rebound and no guarding.  PleurX cath in place, dressings c/d/i  Musculoskeletal: He exhibits edema (1+ pedal edema to mid waist).  Compression stockings in place  Skin: Skin is warm and dry. No rash noted.  Nursing note and vitals reviewed.  Results for orders placed or performed in visit on 11/29/14  CBC with Differential  Result Value Ref Range   WBC 4.3 4.0 - 10.3 10e3/uL   NEUT# 3.7 1.5 - 6.5 10e3/uL   HGB 12.0 (L) 13.0 - 17.1 g/dL   HCT 37.4 (L) 38.4 - 49.9 %   Platelets 225 140 - 400 10e3/uL   MCV 93.7 79.3 - 98.0 fL   MCH 30.1 27.2 - 33.4 pg   MCHC 32.1 32.0 - 36.0 g/dL   RBC 3.99 (L) 4.20 - 5.82 10e6/uL   RDW 20.1 (H) 11.0 - 14.6 %   lymph# 0.3 (L) 0.9 - 3.3 10e3/uL   MONO# 0.3 0.1 - 0.9 10e3/uL   Eosinophils Absolute 0.0 0.0 - 0.5 10e3/uL   Basophils Absolute 0.0 0.0 - 0.1 10e3/uL   NEUT% 86.3 (H) 39.0 - 75.0 %   LYMPH% 6.9 (L) 14.0 - 49.0 %   MONO% 6.3 0.0 - 14.0 %   EOS% 0.0 0.0 - 7.0 %   BASO% 0.5 0.0 - 2.0 %  Comprehensive metabolic panel  Result Value Ref Range   Sodium 138 136 - 145 mEq/L   Potassium 4.5 3.5 - 5.1 mEq/L   Chloride 103 98 - 109 mEq/L   CO2 22 22 - 29 mEq/L   Glucose 289 (H) 70 - 140 mg/dl   BUN 17.1 7.0 - 26.0 mg/dL   Creatinine 0.9 0.7 - 1.3 mg/dL   Total Bilirubin 0.65 0.20 - 1.20 mg/dL   Alkaline Phosphatase 69 40 - 150 U/L   AST 19 5 - 34 U/L   ALT 27 0 - 55 U/L    Total Protein 5.8 (L) 6.4 - 8.3 g/dL   Albumin 3.2 (L) 3.5 - 5.0 g/dL   Calcium 8.9 8.4 - 10.4 mg/dL   Anion Gap 13 (H) 3 - 11 mEq/L   EGFR 81 (L) >90 ml/min/1.73 m2      Assessment & Plan:   Problem List Items Addressed This Visit    Thrombocytopenia   Recurrent left pleural effusion    Continue pleurx catheter drainage M/Th      Protein-calorie malnutrition    Reviewed with patient.    Pedal edema - Primary    Anticipate multifactorial - protein-calorie malnutrition, possible venous insuff, current chemo treatment, known liver lesion.  I recommended continued avoidance of salt/sodium in diet, increased water and continued elevation of legs and compression stocking use.  Actually bp somewhat soft, so will hold flomax while starting lasix.  Restart lasix 10mg daily. Monitor weights and pedal edema.  Reviewed recent echo 07/2014 with preserved EF - doubt CHF related, but if persistent sxs consider rpt echo.    History of diabetes mellitus, type II    H/o diabetes now anticipate steroid induced diabetes. Advise take amaryl regularly when on systemic steroid peri-chemotherapy. Lab Results  Component Value Date   HGBA1C 5.4 03/03/2013  due for A1c.    Adenocarcinoma of left lung, stage 4    Continue f/u with onc. Appreciate their care of patient.        Follow up plan: Return if symptoms worsen or fail to improve.   

## 2014-12-01 NOTE — Assessment & Plan Note (Signed)
Continue f/u with onc. Appreciate their care of patient.

## 2014-12-01 NOTE — Progress Notes (Signed)
12/01/14 @ 8:55 am, [id number] Interview Study:  [name] into the [facility] for the face to face interview with the [place] interviewer in the Audit Room.  He was given a $100.00 [employer] upon completion of the interview.  The number on the gift card envelope was [phone].  Thanked him for his participation in the study.

## 2014-12-06 ENCOUNTER — Telehealth: Payer: Self-pay

## 2014-12-06 ENCOUNTER — Other Ambulatory Visit: Payer: Medicare Other

## 2014-12-06 ENCOUNTER — Other Ambulatory Visit: Payer: Self-pay | Admitting: Cardiothoracic Surgery

## 2014-12-06 DIAGNOSIS — C3492 Malignant neoplasm of unspecified part of left bronchus or lung: Secondary | ICD-10-CM

## 2014-12-06 NOTE — Telephone Encounter (Signed)
Pt's wife left v/m; pt was seen 12/01/14; pt having problems taking lasix and Dr Darnell Level suggested different med pt could take; Mrs Sermersheim would like to try different med at [place].Please advise.

## 2014-12-06 NOTE — Telephone Encounter (Signed)
What sxs are we having - ensure he's taking only 10mg  lasix. Is leg swelling improving? I don't think there's any better med currently than lasix, would continue this esp if it is helping with swelling. How have weights been last few days?

## 2014-12-07 ENCOUNTER — Other Ambulatory Visit: Payer: Self-pay | Admitting: *Deleted

## 2014-12-07 ENCOUNTER — Ambulatory Visit (HOSPITAL_BASED_OUTPATIENT_CLINIC_OR_DEPARTMENT_OTHER): Payer: Medicare Other | Admitting: Nurse Practitioner

## 2014-12-07 ENCOUNTER — Other Ambulatory Visit (HOSPITAL_BASED_OUTPATIENT_CLINIC_OR_DEPARTMENT_OTHER): Payer: Medicare Other

## 2014-12-07 ENCOUNTER — Ambulatory Visit: Payer: Medicare Other | Admitting: Cardiothoracic Surgery

## 2014-12-07 VITALS — BP 140/84 | HR 83 | Temp 97.9°F | Resp 18 | Ht 69.0 in | Wt 192.0 lb

## 2014-12-07 DIAGNOSIS — J90 Pleural effusion, not elsewhere classified: Secondary | ICD-10-CM

## 2014-12-07 DIAGNOSIS — C3492 Malignant neoplasm of unspecified part of left bronchus or lung: Secondary | ICD-10-CM

## 2014-12-07 DIAGNOSIS — E119 Type 2 diabetes mellitus without complications: Secondary | ICD-10-CM

## 2014-12-07 DIAGNOSIS — C3412 Malignant neoplasm of upper lobe, left bronchus or lung: Secondary | ICD-10-CM

## 2014-12-07 DIAGNOSIS — R21 Rash and other nonspecific skin eruption: Secondary | ICD-10-CM

## 2014-12-07 DIAGNOSIS — D61818 Other pancytopenia: Secondary | ICD-10-CM

## 2014-12-07 DIAGNOSIS — Z8639 Personal history of other endocrine, nutritional and metabolic disease: Secondary | ICD-10-CM

## 2014-12-07 DIAGNOSIS — D709 Neutropenia, unspecified: Secondary | ICD-10-CM

## 2014-12-07 LAB — CBC WITH DIFFERENTIAL/PLATELET
BASO%: 0.8 % (ref 0.0–2.0)
Basophils Absolute: 0 10*3/uL (ref 0.0–0.1)
EOS ABS: 0 10*3/uL (ref 0.0–0.5)
EOS%: 1.6 % (ref 0.0–7.0)
HCT: 32.2 % — ABNORMAL LOW (ref 38.4–49.9)
HGB: 10.8 g/dL — ABNORMAL LOW (ref 13.0–17.1)
LYMPH#: 0.4 10*3/uL — AB (ref 0.9–3.3)
LYMPH%: 34.4 % (ref 14.0–49.0)
MCH: 30.9 pg (ref 27.2–33.4)
MCHC: 33.5 g/dL (ref 32.0–36.0)
MCV: 92 fL (ref 79.3–98.0)
MONO#: 0.2 10*3/uL (ref 0.1–0.9)
MONO%: 15.6 % — ABNORMAL HIGH (ref 0.0–14.0)
NEUT#: 0.6 10*3/uL — ABNORMAL LOW (ref 1.5–6.5)
NEUT%: 47.6 % (ref 39.0–75.0)
Platelets: 82 10*3/uL — ABNORMAL LOW (ref 140–400)
RBC: 3.5 10*6/uL — AB (ref 4.20–5.82)
RDW: 17.1 % — ABNORMAL HIGH (ref 11.0–14.6)
WBC: 1.2 10*3/uL — ABNORMAL LOW (ref 4.0–10.3)
nRBC: 0 % (ref 0–0)

## 2014-12-07 LAB — COMPREHENSIVE METABOLIC PANEL (CC13)
ALBUMIN: 3.1 g/dL — AB (ref 3.5–5.0)
ALT: 42 U/L (ref 0–55)
AST: 31 U/L (ref 5–34)
Alkaline Phosphatase: 64 U/L (ref 40–150)
Anion Gap: 10 mEq/L (ref 3–11)
BUN: 19.8 mg/dL (ref 7.0–26.0)
CO2: 28 meq/L (ref 22–29)
Calcium: 8.6 mg/dL (ref 8.4–10.4)
Chloride: 105 mEq/L (ref 98–109)
Creatinine: 0.9 mg/dL (ref 0.7–1.3)
EGFR: 85 mL/min/{1.73_m2} — ABNORMAL LOW (ref >90)
GLUCOSE: 246 mg/dL — AB (ref 70–140)
POTASSIUM: 4.7 meq/L (ref 3.5–5.1)
Sodium: 142 mEq/L (ref 136–145)
Total Bilirubin: 0.95 mg/dL (ref 0.20–1.20)
Total Protein: 5.1 g/dL — ABNORMAL LOW (ref 6.4–8.3)

## 2014-12-07 MED ORDER — ALBUTEROL SULFATE HFA 108 (90 BASE) MCG/ACT IN AERS: 2.0000 | INHALATION_SPRAY | Freq: Four times a day (QID) | RESPIRATORY_TRACT | Status: DC | PRN

## 2014-12-07 MED ORDER — GLIMEPIRIDE 1 MG PO TABS: 1.0000 mg | ORAL_TABLET | Freq: Every day | ORAL | Status: DC

## 2014-12-07 NOTE — Telephone Encounter (Signed)
Spoke with patient. He has been urinating every hour on the hour and it is becoming a nuisance. He confirmed that he has been taking 20mg  QD. I advised to take 10mg  and let me know how he does on that. The swelling has decreased some and his weight has gone down ~ 2-3 pounds. He also advised that his sugars have been "all over the place". This AM he had non-fasting labs drawn and sugar was 243 ~1.5 hrs after eating. This AM fasting it was 193. He doesn't know if he should start the glimepiride QD or continue only on the decadron days. He isn't having any symptoms, but is concerned.

## 2014-12-07 NOTE — Telephone Encounter (Signed)
Agree with trying lasix 10mg  daily early in am. Yes let's start glimepiride every day. Watch for low sugars.  Lab Results  Component Value Date   CREATININE 0.9 12/07/2014   Lab Results  Component Value Date   HGBA1C 5.4 03/03/2013

## 2014-12-08 ENCOUNTER — Other Ambulatory Visit: Payer: Self-pay | Admitting: Family Medicine

## 2014-12-08 ENCOUNTER — Telehealth: Payer: Self-pay | Admitting: *Deleted

## 2014-12-08 ENCOUNTER — Encounter: Payer: Self-pay | Admitting: Nurse Practitioner

## 2014-12-08 DIAGNOSIS — R739 Hyperglycemia, unspecified: Secondary | ICD-10-CM

## 2014-12-08 NOTE — Assessment & Plan Note (Signed)
Patient has history of chronic diabetes.  Blood sugar today was elevated to 246.  Patient states that he has been taking his oral diabetes medication only on an intermittent basis.  He states that his primary care physician and instructed patient to take his diabetes medication only when he is taking the prednisone associated with his chemotherapy.  Patient states that he will continue to keep a close eye on his blood sugar was he is at home.

## 2014-12-08 NOTE — Telephone Encounter (Signed)
Patient notified and verbalized understanding. 

## 2014-12-08 NOTE — Assessment & Plan Note (Signed)
Patient received cycle 2 of his carboplatin/Alimta chemotherapy regimen on 11/29/2014.  He has plans to return for cycle 3 of the same regimen on 12/20/2014.  Also, patient is scheduled for labs and a follow up appointment with Dr. Lucianne Lei Tight on 12/14/2014.  Patient will obtain a thoracic and lumbar MRI for further evaluation of some chronic thigh/buttock muscle weakness on 12/14/2014 as well.  He will also return on 12/20/2014 for labs/visit/cycle 3 of his chemotherapy.

## 2014-12-08 NOTE — Assessment & Plan Note (Signed)
Patient is neutropenic today with an ANC of 0.6.  Did briefly review all neutropenia guidelines with the patient again today.  Hemoglobin is stable at 10.8.  Platelet count is 82; but patient denies any worsening issues with either easy bleeding or bruising.  Patient has plans to travel to [facility] to visit family this weekend.  Advised patient to continue to exercise neutropenic precautions; with good handwashing.

## 2014-12-08 NOTE — Assessment & Plan Note (Signed)
Patient has been diagnosed with chronic, recurrent left pleural effusion.  He has had a left Pleurx drain since September 2015.  Patient states that his wife drains the Pleurx tube every Monday and Thursday.

## 2014-12-08 NOTE — Assessment & Plan Note (Signed)
Patient had a chronic right lower shin area rash for the past 6 weeks or so.  Patient has been applying Vaseline gauzes to area for the past 3 weeks; and right leg rash does appear to be healing very well now.  Area does appear scabbed; with no evidence of infection.  Patient is also noting some erythema and skin breakdown to the area or directly below his left Pleurx drain site.  Does appear that perhaps the Tegaderm holding the tube in place is irritating the skin.  Patient was advised to try the Vaseline gauze dressing changes to this area as well.  Also advised patient to make sure that he keeps the Tegaderm dressing off of the sites.

## 2014-12-08 NOTE — Telephone Encounter (Signed)
Filled and in Kim's box. 

## 2014-12-08 NOTE — Telephone Encounter (Signed)
From for medical supplies in your IN box for completion.

## 2014-12-08 NOTE — Progress Notes (Signed)
will   SYMPTOM MANAGEMENT CLINIC   HPI: Shawn Espinoza 73 y.o. male diagnosed with lung cancer.  Currently undergoing carboplatin/Alimta chemotherapy regimen.  Patient presented as a walk-in to the [facility] today requesting evaluation of a new onset rash to his left abdomen region.  Patient has been managing a chronic right lower shin area rash for approximately 6 weeks.  He has been applying Vaseline gauze dressing changes to the site for the past 3 weeks; and states that the rash to this area is healing very well now.  He noted some mild erythema and skin breakdown directly below his left Pleurx site a few days ago.  It does appear that this area has been irritated by the Tegaderm holding his Pleurx tube in place.  There is no evidence of infection to either rash site.  Patient has recently been complaining of some increased thigh/buttock muscle weakness.  He is scheduled for a thoracic and lumbar MRI on 12/14/2014.  Patient has plans to travel with his wife to [place], [place] this weekend to spend times with family and friends.     HPI  CURRENT THERAPY: Upcoming Treatment Dates - LUNG Pemetrexed (Alimta) / Carboplatin q21d Days with orders from any treatment category:  12/20/2014      [facility] ONC SCHEDULING COMMUNICATION      CBC with Differential      Comprehensive metabolic panel      ondansetron (ZOFRAN) IVPB 16 mg      Dexamethasone Sodium Phosphate (DECADRON) injection 20 mg      CARBOplatin CHEMO intradermal Test Dose 100 mcg/0.72m      PEMEtrexed (ALIMTA) 1,025 mg in sodium chloride 0.9 % 100 mL chemo infusion      CARBOplatin (PARAPLATIN) in sodium chloride 0.9 % 100 mL chemo infusion      sodium chloride 0.9 % injection 10 mL      heparin lock flush 100 unit/mL      heparin lock flush 100 unit/mL      alteplase (CATHFLO ACTIVASE) injection 2 mg      sodium chloride 0.9 % injection 3 mL      cyanocobalamin ((VITAMIN B-12)) injection 1,000 mcg      0.9  %  sodium chloride infusion      TREATMENT CONDITIONS 01/10/2015      [facility] ONC SCHEDULING COMMUNICATION      CBC with Differential      Comprehensive metabolic panel      ondansetron (ZOFRAN) IVPB 16 mg      Dexamethasone Sodium Phosphate (DECADRON) injection 20 mg      CARBOplatin CHEMO intradermal Test Dose 100 mcg/0.068m     PEMEtrexed (ALIMTA) 1,025 mg in sodium chloride 0.9 % 100 mL chemo infusion      CARBOplatin (PARAPLATIN) in sodium chloride 0.9 % 100 mL chemo infusion      sodium chloride 0.9 % injection 10 mL      heparin lock flush 100 unit/mL      heparin lock flush 100 unit/mL      alteplase (CATHFLO ACTIVASE) injection 2 mg      sodium chloride 0.9 % injection 3 mL      0.9 %  sodium chloride infusion      TREATMENT CONDITIONS 01/31/2015      [facility] ONC SCHEDULING COMMUNICATION      CBC with Differential      Comprehensive metabolic panel      ondansetron (ZOFRAN) IVPB 16 mg  Dexamethasone Sodium Phosphate (DECADRON) injection 20 mg      CARBOplatin CHEMO intradermal Test Dose 100 mcg/0.31m      PEMEtrexed (ALIMTA) 1,025 mg in sodium chloride 0.9 % 100 mL chemo infusion      CARBOplatin (PARAPLATIN) in sodium chloride 0.9 % 100 mL chemo infusion      sodium chloride 0.9 % injection 10 mL      heparin lock flush 100 unit/mL      heparin lock flush 100 unit/mL      alteplase (CATHFLO ACTIVASE) injection 2 mg      sodium chloride 0.9 % injection 3 mL      0.9 %  sodium chloride infusion      TREATMENT CONDITIONS    ROS  Past Medical History  Diagnosis Date  . History of diabetes mellitus, type II     resolved with diet, h/o neuropathy  . Diverticulosis of colon   . Hyperlipidemia   . Hypertension   . Fatty liver 02/29/00    abd ultrasound  . Lower back pain   . Systolic murmur 23235   2Decho - normal LV fxn, EF 55%, mild AS, biatrial enlargement  . History of tobacco use quit 1990s  . Positive hepatitis C antibody test 2013    HCV RNA negative - ?cleared  infection  . 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 Personal history of colonic adenomas 07/06/2013  . Hypertensive retinopathy of both eyes 2015    mild  . 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 Dysrhythmia     Atrial fib (found 07/2014)  . Shortness of breath   . Pneumonia   . GERD (gastroesophageal reflux disease)     occasional  . Arthritis   . Neuropathy   . Constipation     Past Surgical History  Procedure Laterality Date  . Colonoscopy  2004  . Colonoscopy  2014    tubular adenoma x1, mod diverticulosis ([facility])  . Flexible bronchoscopy  02/2014    WNL  . Video bronchoscopy Bilateral 03/17/2014    Procedure: VIDEO BRONCHOSCOPY WITH FLUORO;  Surgeon: MTanda Rockers MD;  Location: [facility];  Service: Cardiopulmonary;  Laterality: Bilateral;  . Chest tube insertion Left 08/26/2014    Procedure: INSERTION OF LEFT  PLEURAL DRAINAGE CATHETER;  Surgeon: PIvin Poot MD;  Location: [facility]  Service: Thoracic;  Laterality: Left;    has History of diabetes mellitus, type II; HYPERLIPIDEMIA; History of tobacco use; HYPERTENSION; HEMORRHOIDS; DIVERTICULOSIS, COLON; FATTY LIVER DISEASE; Medicare annual wellness visit, subsequent; Positive hepatitis C antibody test; Systolic murmur; Lower back pain; Polycythemia; BPH (benign prostatic hypertrophy); Right knee pain; Personal history of colonic adenoma; DOE (dyspnea on exertion); Adenocarcinoma of left lung, stage 4; Liver lesion; Recurrent left pleural effusion; History of atrial fibrillation; Cerumen debris on tympanic membrane; Skin rash; Bronchitis; Other pancytopenia; Hypoalbuminemia; Dehydration; Fatigue; Diarrhea; Interstitial pneumonitis; CHF (congestive heart failure); Abrasion of anterior right lower leg; Thrombocytopenia; Pedal edema; and Protein-calorie malnutrition on his problem list.     is allergic to candesartan cilexetil; diltiazem hcl; doxazosin mesylate;  nifedipine; pravastatin; red yeast rice; rosuvastatin; and simvastatin.    Medication List       This list is accurate as of: 12/07/14 11:59 PM.  Always use your most recent med list.               albuterol 108 (90 BASE) MCG/ACT  inhaler  Commonly known as:  PROVENTIL HFA;VENTOLIN HFA  Inhale 2 puffs into the lungs every 6 (six) hours as needed for wheezing or shortness of breath.     amLODipine 5 MG tablet  Commonly known as:  NORVASC  Take 1 tablet (5 mg total) by mouth daily.     aspirin EC 325 MG tablet  Take 325 mg by mouth daily.     atenolol 25 MG tablet  Commonly known as:  TENORMIN  Take 1 tablet (25 mg total) by mouth 2 (two) times daily.     cetirizine 10 MG tablet  Commonly known as:  ZYRTEC  Take 10 mg by mouth daily as needed for allergies or rhinitis.     chlorpheniramine-HYDROcodone 10-8 MG/5ML Lqcr  Commonly known as:  TUSSIONEX  Take 5 mLs by mouth every 12 (twelve) hours as needed for cough.     dexamethasone 4 MG tablet  Commonly known as:  DECADRON  4 mg by mouth twice a day the day before, day of and day after the chemotherapy every 3 weeks.     EYE VITAMINS PO  Take 1 capsule by mouth 2 (two) times daily. Due to macular degeneration     folic acid 1 MG tablet  Commonly known as:  FOLVITE  TAKE 1 TABLET BY MOUTH EVERY DAY     glimepiride 1 MG tablet  Commonly known as:  AMARYL  Take 1 tablet (1 mg total) by mouth daily with breakfast. Watch for low sugars     guaiFENesin 600 MG 12 hr tablet  Commonly known as:  MUCINEX  Take 600 mg by mouth 2 (two) times daily.     HYDROcodone-acetaminophen 5-325 MG per tablet  Commonly known as:  NORCO/VICODIN  Take 1 tablet by mouth every 6 (six) hours as needed for moderate pain.     ibuprofen 800 MG tablet  Commonly known as:  ADVIL,MOTRIN  Take 800 mg by mouth 2 (two) times daily as needed for moderate pain.     LASIX 20 MG tablet  Generic drug:  furosemide  Take 20 mg by mouth daily.      magic mouthwash w/lidocaine Soln  Take 5 mLs by mouth 3 (three) times daily as needed for mouth pain.     methocarbamol 500 MG tablet  Commonly known as:  ROBAXIN  Take 500 mg by mouth every 8 (eight) hours as needed (pain).     polyethylene glycol packet  Commonly known as:  MIRALAX / GLYCOLAX  Take 8 g by mouth daily.     prochlorperazine 10 MG tablet  Commonly known as:  COMPAZINE  Take 1 tablet (10 mg total) by mouth every 6 (six) hours as needed for nausea or vomiting.     sucralfate 1 GM/10ML suspension  Commonly known as:  CARAFATE  Take 1 g by mouth 4 (four) times daily as needed (reflux).         PHYSICAL EXAMINATION  Blood pressure 140/84, pulse 83, temperature 97.9 F (36.6 C), temperature source Oral, resp. rate 18, height 5' 9"  (1.753 m), weight 192 lb (87.091 kg), SpO2 97 %.  Physical Exam  Constitutional: He is oriented to person, place, and time and well-developed, well-nourished, and in no distress.  HENT:  Head: Normocephalic and atraumatic.  Mouth/Throat: Oropharynx is clear and moist.  Eyes: Conjunctivae and EOM are normal. Pupils are equal, round, and reactive to light. Right eye exhibits no discharge. Left eye exhibits no discharge. No scleral icterus.  Neck: Normal range of motion. Neck supple. No JVD present. No tracheal deviation present. No thyromegaly present.  Cardiovascular: Normal rate, regular rhythm, normal heart sounds and intact distal pulses.   Pulmonary/Chest: Breath sounds normal. No stridor. No respiratory distress. He has no wheezes. He has no rales. He exhibits no tenderness.  Patient does appear slightly short of breath on exam as his baseline.  Left Pleurx drain intact.  Patient denies any chest pain, chest pressure, or pain with inspiration.  Abdominal: Soft. Bowel sounds are normal. He exhibits no distension and no mass. There is no tenderness. There is no rebound and no guarding.  Musculoskeletal: Normal range of motion. He exhibits  no edema or tenderness.  Lymphadenopathy:    He has no cervical adenopathy.  Neurological: He is alert and oriented to person, place, and time. Gait normal.  Skin: Skin is warm and dry. There is erythema.  Rash to right anterior shin area does appear scabbed and healing.  Patient has a ver small area of erythema and skin breakdown directly below his left Pleurx catheter site.  Does appear that this is associated with placement of the Tegaderm to hold the Pleurx catheter intact.  No evidence of active infection.    Psychiatric: Affect normal.  Nursing note and vitals reviewed.   LABORATORY DATA:. Appointment on 12/07/2014  Component Date Value Ref Range Status  . WBC 12/07/2014 1.2* 4.0 - 10.3 10e3/uL Final  . NEUT# 12/07/2014 0.6* 1.5 - 6.5 10e3/uL Final  . HGB 12/07/2014 10.8* 13.0 - 17.1 g/dL Final  . HCT 12/07/2014 32.2* 38.4 - 49.9 % Final  . Platelets 12/07/2014 82* 140 - 400 10e3/uL Final  . MCV 12/07/2014 92.0  79.3 - 98.0 fL Final  . MCH 12/07/2014 30.9  27.2 - 33.4 pg Final  . MCHC 12/07/2014 33.5  32.0 - 36.0 g/dL Final  . RBC 12/07/2014 3.50* 4.20 - 5.82 10e6/uL Final  . RDW 12/07/2014 17.1* 11.0 - 14.6 % Final  . lymph# 12/07/2014 0.4* 0.9 - 3.3 10e3/uL Final  . MONO# 12/07/2014 0.2  0.1 - 0.9 10e3/uL Final  . Eosinophils Absolute 12/07/2014 0.0  0.0 - 0.5 10e3/uL Final  . Basophils Absolute 12/07/2014 0.0  0.0 - 0.1 10e3/uL Final  . NEUT% 12/07/2014 47.6  39.0 - 75.0 % Final  . LYMPH% 12/07/2014 34.4  14.0 - 49.0 % Final  . MONO% 12/07/2014 15.6* 0.0 - 14.0 % Final  . EOS% 12/07/2014 1.6  0.0 - 7.0 % Final  . BASO% 12/07/2014 0.8  0.0 - 2.0 % Final  . nRBC 12/07/2014 0  0 - 0 % Final  . Sodium 12/07/2014 142  136 - 145 mEq/L Final  . Potassium 12/07/2014 4.7  3.5 - 5.1 mEq/L Final  . Chloride 12/07/2014 105  98 - 109 mEq/L Final  . CO2 12/07/2014 28  22 - 29 mEq/L Final  . Glucose 12/07/2014 246* 70 - 140 mg/dl Final  . BUN 12/07/2014 19.8  7.0 - 26.0 mg/dL Final    . Creatinine 12/07/2014 0.9  0.7 - 1.3 mg/dL Final  . Total Bilirubin 12/07/2014 0.95  0.20 - 1.20 mg/dL Final  . Alkaline Phosphatase 12/07/2014 64  40 - 150 U/L Final  . AST 12/07/2014 31  5 - 34 U/L Final  . ALT 12/07/2014 42  0 - 55 U/L Final  . Total Protein 12/07/2014 5.1* 6.4 - 8.3 g/dL Final  . Albumin 12/07/2014 3.1* 3.5 - 5.0 g/dL Final  . Calcium 12/07/2014 8.6  8.4 - 10.4 mg/dL Final  . Anion Gap 12/07/2014 10  3 - 11 mEq/L Final  . EGFR 12/07/2014 85* >90 ml/min/1.73 m2 Final   eGFR is calculated using the CKD-EPI Creatinine Equation (2009)     RADIOGRAPHIC STUDIES: No results found.  ASSESSMENT/PLAN:    Adenocarcinoma of left lung, stage 4 Patient received cycle 2 of his carboplatin/Alimta chemotherapy regimen on 11/29/2014.  He has plans to return for cycle 3 of the same regimen on 12/20/2014.  Also, patient is scheduled for labs and a follow up appointment with Dr. Lucianne Lei Tight on 12/14/2014.  Patient will obtain a thoracic and lumbar MRI for further evaluation of some chronic thigh/buttock muscle weakness on 12/14/2014 as well.  He will also return on 12/20/2014 for labs/visit/cycle 3 of his chemotherapy.  History of diabetes mellitus, type II Patient has history of chronic diabetes.  Blood sugar today was elevated to 246.  Patient states that he has been taking his oral diabetes medication only on an intermittent basis.  He states that his primary care physician and instructed patient to take his diabetes medication only when he is taking the prednisone associated with his chemotherapy.  Patient states that he will continue to keep a close eye on his blood sugar was he is at home.  Other pancytopenia Patient is neutropenic today with an ANC of 0.6.  Did briefly review all neutropenia guidelines with the patient again today.  Hemoglobin is stable at 10.8.  Platelet count is 82; but patient denies any worsening issues with either easy bleeding or bruising.  Patient has  plans to travel to [facility] to visit family this weekend.  Advised patient to continue to exercise neutropenic precautions; with good handwashing.  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  He has had a left Pleurx drain since September 2015.  Patient states that his wife drains the Pleurx tube every Monday and Thursday.  Skin rash Patient had a chronic right lower shin area rash for the past 6 weeks or so.  Patient has been applying Vaseline gauzes to area for the past 3 weeks; and right leg rash does appear to be healing very well now.  Area does appear scabbed; with no evidence of infection.  Patient is also noting some erythema and skin breakdown to the area or directly below his left Pleurx drain site.  Does appear that perhaps the Tegaderm holding the tube in place is irritating the skin.  Patient was advised to try the Vaseline gauze dressing changes to this area as well.  Also advised patient to make sure that he keeps the Tegaderm dressing off of the sites.  Patient stated understanding of all instructions; and was in agreement with this plan of care. The patient knows to call the clinic with any problems, questions or concerns.   Review/collaboration with Dr. [name] regarding all aspects of patient's visit today.   Total time spent with patient was 25 minutes;  with greater than 75 percent of that time spent in face to face counseling regarding his symptoms, and coordination of care and follow up.  Disclaimer: This note was dictated with voice recognition software. Similar sounding words can inadvertently be transcribed and may not be corrected upon review.   Drue Second, NP 12/08/2014

## 2014-12-09 NOTE — Telephone Encounter (Signed)
Form faxed back and scanned 

## 2014-12-12 ENCOUNTER — Encounter: Payer: Self-pay | Admitting: Family Medicine

## 2014-12-13 ENCOUNTER — Telehealth: Payer: Self-pay | Admitting: *Deleted

## 2014-12-13 ENCOUNTER — Other Ambulatory Visit: Payer: Medicare Other | Admitting: Lab

## 2014-12-13 NOTE — Telephone Encounter (Signed)
Pt's wife Shawn Espinoza called stating that pt has a sore throat, cough, and places on his arms and fatigue, no fever.  She feels like he needs to be seen.  Advised he come in for lab work tomorrow, wait for lab results and fill out a walk-in form so he can be assessed and determined whether he does need to be seen in our symptom mgmt clinic.  She verbalized understanding.

## 2014-12-14 ENCOUNTER — Ambulatory Visit (INDEPENDENT_AMBULATORY_CARE_PROVIDER_SITE_OTHER): Payer: Medicare Other | Admitting: Cardiothoracic Surgery

## 2014-12-14 ENCOUNTER — Ambulatory Visit
Admission: RE | Admit: 2014-12-14 | Discharge: 2014-12-14 | Disposition: A | Discharge status: Home / Self Care | Payer: Medicare Other | Source: Ambulatory Visit | Attending: Cardiothoracic Surgery | Admitting: Cardiothoracic Surgery

## 2014-12-14 ENCOUNTER — Ambulatory Visit (HOSPITAL_BASED_OUTPATIENT_CLINIC_OR_DEPARTMENT_OTHER): Payer: Medicare Other | Admitting: Lab

## 2014-12-14 ENCOUNTER — Encounter: Payer: Self-pay | Admitting: *Deleted

## 2014-12-14 ENCOUNTER — Ambulatory Visit (HOSPITAL_BASED_OUTPATIENT_CLINIC_OR_DEPARTMENT_OTHER): Payer: Medicare Other | Admitting: Nurse Practitioner

## 2014-12-14 ENCOUNTER — Other Ambulatory Visit: Payer: Self-pay | Admitting: Physician Assistant

## 2014-12-14 ENCOUNTER — Other Ambulatory Visit: Payer: Self-pay | Admitting: Cardiothoracic Surgery

## 2014-12-14 ENCOUNTER — Telehealth: Payer: Self-pay | Admitting: *Deleted

## 2014-12-14 ENCOUNTER — Encounter: Payer: Self-pay | Admitting: Nurse Practitioner

## 2014-12-14 ENCOUNTER — Other Ambulatory Visit: Payer: Medicare Other

## 2014-12-14 ENCOUNTER — Ambulatory Visit (HOSPITAL_COMMUNITY): Admission: RE | Admit: 2014-12-14 | Payer: Medicare Other | Source: Ambulatory Visit

## 2014-12-14 ENCOUNTER — Telehealth: Payer: Self-pay | Admitting: Nurse Practitioner

## 2014-12-14 ENCOUNTER — Encounter: Payer: Self-pay | Admitting: Cardiothoracic Surgery

## 2014-12-14 VITALS — BP 138/88 | HR 86 | Resp 20 | Ht 69.0 in | Wt 200.0 lb

## 2014-12-14 VITALS — BP 124/71 | HR 78 | Temp 97.8°F | Resp 21 | Ht 69.0 in | Wt 201.6 lb

## 2014-12-14 DIAGNOSIS — C3492 Malignant neoplasm of unspecified part of left bronchus or lung: Secondary | ICD-10-CM

## 2014-12-14 DIAGNOSIS — Z8639 Personal history of other endocrine, nutritional and metabolic disease: Secondary | ICD-10-CM

## 2014-12-14 DIAGNOSIS — C3490 Malignant neoplasm of unspecified part of unspecified bronchus or lung: Secondary | ICD-10-CM

## 2014-12-14 DIAGNOSIS — S51811A Laceration without foreign body of right forearm, initial encounter: Secondary | ICD-10-CM

## 2014-12-14 DIAGNOSIS — J90 Pleural effusion, not elsewhere classified: Secondary | ICD-10-CM

## 2014-12-14 DIAGNOSIS — J910 Malignant pleural effusion: Secondary | ICD-10-CM

## 2014-12-14 DIAGNOSIS — D61818 Other pancytopenia: Secondary | ICD-10-CM

## 2014-12-14 DIAGNOSIS — R21 Rash and other nonspecific skin eruption: Secondary | ICD-10-CM

## 2014-12-14 DIAGNOSIS — M79605 Pain in left leg: Principal | ICD-10-CM

## 2014-12-14 DIAGNOSIS — M79604 Pain in right leg: Secondary | ICD-10-CM

## 2014-12-14 DIAGNOSIS — S51801A Unspecified open wound of right forearm, initial encounter: Secondary | ICD-10-CM

## 2014-12-14 LAB — COMPREHENSIVE METABOLIC PANEL (CC13)
ALK PHOS: 73 U/L (ref 40–150)
ALT: 29 U/L (ref 0–55)
AST: 23 U/L (ref 5–34)
Albumin: 3.1 g/dL — ABNORMAL LOW (ref 3.5–5.0)
Anion Gap: 9 mEq/L (ref 3–11)
BILIRUBIN TOTAL: 0.83 mg/dL (ref 0.20–1.20)
BUN: 14.2 mg/dL (ref 7.0–26.0)
CO2: 27 mEq/L (ref 22–29)
Calcium: 8.2 mg/dL — ABNORMAL LOW (ref 8.4–10.4)
Chloride: 102 mEq/L (ref 98–109)
Creatinine: 1 mg/dL (ref 0.7–1.3)
EGFR: 75 mL/min/{1.73_m2} — ABNORMAL LOW (ref >90)
Glucose: 308 mg/dl — ABNORMAL HIGH (ref 70–140)
Potassium: 4 mEq/L (ref 3.5–5.1)
Sodium: 138 mEq/L (ref 136–145)
Total Protein: 5.4 g/dL — ABNORMAL LOW (ref 6.4–8.3)

## 2014-12-14 LAB — CBC WITH DIFFERENTIAL/PLATELET
BASO%: 0 % (ref 0.0–2.0)
Basophils Absolute: 0 10*3/uL (ref 0.0–0.1)
EOS ABS: 0 10*3/uL (ref 0.0–0.5)
EOS%: 0 % (ref 0.0–7.0)
HCT: 32.1 % — ABNORMAL LOW (ref 38.4–49.9)
HEMOGLOBIN: 10.5 g/dL — AB (ref 13.0–17.1)
LYMPH%: 16.9 % (ref 14.0–49.0)
MCH: 31.2 pg (ref 27.2–33.4)
MCHC: 32.7 g/dL (ref 32.0–36.0)
MCV: 95.3 fL (ref 79.3–98.0)
MONO#: 0.4 10*3/uL (ref 0.1–0.9)
MONO%: 13.9 % (ref 0.0–14.0)
NEUT#: 1.9 10*3/uL (ref 1.5–6.5)
NEUT%: 69.2 % (ref 39.0–75.0)
PLATELETS: 70 10*3/uL — AB (ref 140–400)
RBC: 3.37 10*6/uL — ABNORMAL LOW (ref 4.20–5.82)
RDW: 19.6 % — ABNORMAL HIGH (ref 11.0–14.6)
WBC: 2.7 10*3/uL — ABNORMAL LOW (ref 4.0–10.3)
lymph#: 0.5 10*3/uL — ABNORMAL LOW (ref 0.9–3.3)

## 2014-12-14 MED ORDER — LORAZEPAM 0.5 MG PO TABS: ORAL_TABLET | ORAL | Status: DC

## 2014-12-14 NOTE — Assessment & Plan Note (Signed)
Blood draw today reveals that neutropenia has resolved with an ANC of 1.9 today.  Hemoglobin remained stable at 10.5.  However, platelet count has slightly decreased down to 70.  Patient does have multiple bruises to bilateral forearms.  Patient denies any other active issues with bleeding.

## 2014-12-14 NOTE — Assessment & Plan Note (Signed)
Patient does have a skin tear to his right posterior wrist area.  Patient Shawn Espinoza has a Vaseline gauze and dressing in place.  No evidence of active infection.

## 2014-12-14 NOTE — Assessment & Plan Note (Signed)
Patient had a chronic right lower shin area rash for the past 6 weeks or so.  Patient has been applying Vaseline gauzes to area for the past 3 weeks; and right leg rash does appear to be healing very well now.  Area does appear scabbed; with no evidence of infection.  Patient is also noting some erythema and skin breakdown to the area or directly below his left Pleurx drain site.  Does appear that perhaps the Tegaderm holding the tube in place is irritating the skin.  Does appear that the skin irritation has slightly progressed since exam last week.  Patient was given a different type of the adherent dressing to hold the Pleurx drain in place to see if that helps with rash development.  Also, patient has plans to follow-up with Dr. Harlin Heys later this afternoon for follow-up of his left pleural effusion.  Both patient and his wife have plans to review dressing options that may prevent skin irritation with Dr. Harlin Heys as well.

## 2014-12-14 NOTE — Telephone Encounter (Signed)
Per 12/23 POF NP/CB visit added pt here now, called Monica to advise I have added pt to sch for today.... KJ

## 2014-12-14 NOTE — Assessment & Plan Note (Signed)
Patient has been diagnosed with chronic, recurrent left pleural effusion.  He has had a left Pleurx drain since September 2015.  Patient states that his wife drains the Pleurx tube every Monday and Thursday.   Patient has a follow-up visit with Dr. Harlin Heys for later this afternoon for follow-up of his left pleural effusion.

## 2014-12-14 NOTE — Progress Notes (Signed)
will   [facility]   HPI: Shawn Espinoza 73 y.o. male diagnosed with lung cancer.  Currently undergoing carboplatin/Alimta chemotherapy regimen.  Patient requesting urgent care visit today for follow-up of his chronic skin irritation issue; as well as continued fatigue and dyspnea.  Patient was found to have some skin breakdown around the Tegaderm in closing his Pleurx catheter drain.  Patient has been using Vaseline gauze dressings to the rash; and attempting to keep the Tegaderm off these broken down areas.  Patient is also complaining of worsening fatigue and continued, chronic dyspnea.  He complains of new onset mild sore throat a few days ago; but states that it is slowly resolving.  He denies any GI symptoms whatsoever.  He denies any recent fevers or chills.  Patient's wife continues to drain his Pleurx catheter every Monday and Thursday.  Also, patient was scheduled to obtain an MRI of his thoracic and lumbar region today; but patient himself canceled the MRI.  He stated that he was unable to undergo the MRI in the small MRI machine; has rescheduled the MRI in the larger machine for 12/28/2014.  He is requesting some Ativan to take on an as-needed basis for anxiety related to the MRI procedure.   HPI  CURRENT THERAPY: Upcoming Treatment Dates - LUNG Pemetrexed (Alimta) / Carboplatin q21d Days with orders from any treatment category:  12/20/2014      [facility] ON[facility] SCHEDULING COMMUNICATION      CBC with Differential      Comprehensive metabolic panel      ondansetron (ZOFRAN) IVPB 16 mg      Dexamethasone Sodium Phosphate (DECADRON) injection 20 mg      CARBOplatin CHEMO intradermal Test Dose 100 mcg/0.81m      PEMEtrexed (ALIMTA) 1,025 mg in sodium chloride 0.9 % 100 mL chemo infusion      CARBOplatin (PARAPLATIN) in sodium chloride 0.9 % 100 mL chemo infusion      sodium chloride 0.9 % injection 10 mL      heparin lock flush 100 unit/mL      heparin lock flush  100 unit/mL      alteplase (CATHFLO ACTIVASE) injection 2 mg      sodium chloride 0.9 % injection 3 mL      cyanocobalamin ((VITAMIN B-12)) injection 1,000 mcg      0.9 %  sodium chloride infusion      TREATMENT CONDITIONS 01/10/2015      [facility] ON[facility] SCHEDULING COMMUNICATION      CBC with Differential      Comprehensive metabolic panel      ondansetron (ZOFRAN) IVPB 16 mg      Dexamethasone Sodium Phosphate (DECADRON) injection 20 mg      CARBOplatin CHEMO intradermal Test Dose 100 mcg/0.046m     PEMEtrexed (ALIMTA) 1,025 mg in sodium chloride 0.9 % 100 mL chemo infusion      CARBOplatin (PARAPLATIN) in sodium chloride 0.9 % 100 mL chemo infusion      sodium chloride 0.9 % injection 10 mL      heparin lock flush 100 unit/mL      heparin lock flush 100 unit/mL      alteplase (CATHFLO ACTIVASE) injection 2 mg      sodium chloride 0.9 % injection 3 mL      0.9 %  sodium chloride infusion      TREATMENT CONDITIONS 01/31/2015      [facility] ON[facility] SCHEDULING COMMUNICATION      CBC  with Differential      Comprehensive metabolic panel      ondansetron (ZOFRAN) IVPB 16 mg      Dexamethasone Sodium Phosphate (DECADRON) injection 20 mg      CARBOplatin CHEMO intradermal Test Dose 100 mcg/0.7m      PEMEtrexed (ALIMTA) 1,025 mg in sodium chloride 0.9 % 100 mL chemo infusion      CARBOplatin (PARAPLATIN) in sodium chloride 0.9 % 100 mL chemo infusion      sodium chloride 0.9 % injection 10 mL      heparin lock flush 100 unit/mL      heparin lock flush 100 unit/mL      alteplase (CATHFLO ACTIVASE) injection 2 mg      sodium chloride 0.9 % injection 3 mL      0.9 %  sodium chloride infusion      TREATMENT CONDITIONS    ROS  Past Medical History  Diagnosis Date  . History of diabetes mellitus, type II     resolved with diet, h/o neuropathy  . Diverticulosis of colon   . Hyperlipidemia   . Hypertension   . Fatty liver 02/29/00    abd ultrasound  . Lower back pain   . Systolic murmur 29371    2Decho - normal LV fxn, EF 55%, mild AS, biatrial enlargement  . History of tobacco use quit 1990s  . 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 Personal history of colonic adenomas 07/06/2013  . Hypertensive retinopathy of both eyes 2015    mild  . 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 Dysrhythmia     Atrial fib (found 07/2014)  . Shortness of breath   . Pneumonia   . GERD (gastroesophageal reflux disease)     occasional  . Arthritis   . Neuropathy   . Constipation     Past Surgical History  Procedure Laterality Date  . Colonoscopy  2004  . Colonoscopy  2014    tubular adenoma x1, mod diverticulosis ([place])  . Flexible bronchoscopy  02/2014    WNL  . Video bronchoscopy Bilateral 03/17/2014    Procedure: VIDEO BRONCHOSCOPY WITH FLUORO;  Surgeon: MTanda Rockers MD;  Location: [facility];  Service: Cardiopulmonary;  Laterality: Bilateral;  . Chest tube insertion Left 08/26/2014    Procedure: INSERTION OF LEFT  PLEURAL DRAINAGE CATHETER;  Surgeon: PIvin Poot MD;  Location: [facility]  Service: Thoracic;  Laterality: Left;    has History of diabetes mellitus, type II; HYPERLIPIDEMIA; History of tobacco use; HYPERTENSION; HEMORRHOIDS; DIVERTICULOSIS, COLON; FATTY LIVER DISEASE; Medicare annual wellness visit, subsequent; Positive hepatitis C antibody test; Systolic murmur; Lower back pain; Polycythemia; BPH (benign prostatic hypertrophy); Right knee pain; Personal history of colonic adenoma; DOE (dyspnea on exertion); Adenocarcinoma of left lung, stage 4; Liver lesion; Recurrent left pleural effusion; History of atrial fibrillation; Cerumen debris on tympanic membrane; Skin rash; Bronchitis; Other pancytopenia; Hypoalbuminemia; Dehydration; Fatigue; Diarrhea; Interstitial pneumonitis; CHF (congestive heart failure); Abrasion of anterior right  lower leg; Thrombocytopenia; Pedal edema; Protein-calorie malnutrition; and Skin tear of right forearm without complication on his problem list.     is allergic to candesartan cilexetil; diltiazem hcl; doxazosin mesylate; nifedipine; pravastatin; red yeast rice; rosuvastatin; and simvastatin.    Medication List       This list is accurate  as of: 12/14/14 11:53 AM.  Always use your most recent med list.               albuterol 108 (90 BASE) MCG/ACT inhaler  Commonly known as:  PROVENTIL HFA;VENTOLIN HFA  Inhale 2 puffs into the lungs every 6 (six) hours as needed for wheezing or shortness of breath.     amLODipine 5 MG tablet  Commonly known as:  NORVASC  Take 1 tablet (5 mg total) by mouth daily.     aspirin EC 325 MG tablet  Take 325 mg by mouth daily.     atenolol 25 MG tablet  Commonly known as:  TENORMIN  Take 1 tablet (25 mg total) by mouth 2 (two) times daily.     cetirizine 10 MG tablet  Commonly known as:  ZYRTEC  Take 10 mg by mouth daily as needed for allergies or rhinitis.     chlorpheniramine-HYDROcodone 10-8 MG/5ML Lqcr  Commonly known as:  TUSSIONEX  Take 5 mLs by mouth every 12 (twelve) hours as needed for cough.     dexamethasone 4 MG tablet  Commonly known as:  DECADRON  4 mg by mouth twice a day the day before, day of and day after the chemotherapy every 3 weeks.     EYE VITAMINS PO  Take 1 capsule by mouth 2 (two) times daily. Due to macular degeneration     folic acid 1 MG tablet  Commonly known as:  FOLVITE  TAKE 1 TABLET BY MOUTH EVERY DAY     glimepiride 1 MG tablet  Commonly known as:  AMARYL  Take 1 tablet (1 mg total) by mouth daily with breakfast. Watch for low sugars     guaiFENesin 600 MG 12 hr tablet  Commonly known as:  MUCINEX  Take 600 mg by mouth 2 (two) times daily.     HYDROcodone-acetaminophen 5-325 MG per tablet  Commonly known as:  NORCO/VICODIN  Take 1 tablet by mouth every 6 (six) hours as needed for moderate pain.       ibuprofen 800 MG tablet  Commonly known as:  ADVIL,MOTRIN  Take 800 mg by mouth 2 (two) times daily as needed for moderate pain.     LASIX 20 MG tablet  Generic drug:  furosemide  Take 20 mg by mouth daily.     LORazepam 0.5 MG tablet  Commonly known as:  ATIVAN  Take 1-2 tabs PO Q 6 hours PRN nausea or anxiety. May make sleepy.     magic mouthwash w/lidocaine Soln  Take 5 mLs by mouth 3 (three) times daily as needed for mouth pain.     methocarbamol 500 MG tablet  Commonly known as:  ROBAXIN  Take 500 mg by mouth every 8 (eight) hours as needed (pain).     polyethylene glycol packet  Commonly known as:  MIRALAX / GLYCOLAX  Take 8 g by mouth daily.     prochlorperazine 10 MG tablet  Commonly known as:  COMPAZINE  Take 1 tablet (10 mg total) by mouth every 6 (six) hours as needed for nausea or vomiting.     sucralfate 1 GM/10ML suspension  Commonly known as:  CARAFATE  Take 1 g by mouth 4 (four) times daily as needed (reflux).         PHYSICAL EXAMINATION  Blood pressure 124/71, pulse 78, temperature 97.8 F (36.6 C), temperature source Oral, resp. rate 21, height 5' 9"  (1.753 m), weight 201 lb 9.6 oz (91.445 kg), SpO2 100 %.  Physical Exam  Constitutional: He is oriented to person, place, and time. Vital signs are normal. He appears unhealthy.  HENT:  Head: Normocephalic and atraumatic.  Mouth/Throat: Oropharynx is clear and moist.  Oropharynx clear with no exudate or erythema.  Eyes: Conjunctivae and EOM are normal. Pupils are equal, round, and reactive to light. Right eye exhibits no discharge. Left eye exhibits no discharge. No scleral icterus.  Neck: Normal range of motion. Neck supple. No JVD present. No tracheal deviation present. No thyromegaly present.  Cardiovascular: Normal rate, regular rhythm, normal heart sounds and intact distal pulses.   Pulmonary/Chest: No stridor. No respiratory distress. He has no wheezes. He has no rales. He exhibits no  tenderness.  Noted to have some mild dyspnea as his baseline.  Also has a dry, nonproductive cough; but no acute respiratory distress.  Abdominal: Soft. Bowel sounds are normal. He exhibits no distension and no mass. There is no tenderness. There is no rebound and no guarding.  Musculoskeletal: Normal range of motion. He exhibits edema. He exhibits no tenderness.  +1 bilateral edema to lower extremities as his baseline.  Lymphadenopathy:    He has no cervical adenopathy.  Neurological: He is alert and oriented to person, place, and time. Gait normal.  Skin: Skin is warm and dry. Rash noted. No erythema.  Patient has areas of skin irritation/breakdown surrounding his Tegaderm site to his left upper abdomen; as well as some noted erythema directly below the Tegaderm as well.  Patient also has multiple bruises to bilateral forearms.  He has a skin tear to his right wrist as well.  Right anterior shin rash/abrasion almost completely healed.  Psychiatric: Affect normal.  Nursing note and vitals reviewed.   LABORATORY DATA:. Clinical Support on 12/14/2014  Component Date Value Ref Range Status  . WBC 12/14/2014 2.7* 4.0 - 10.3 10e3/uL Final  . NEUT# 12/14/2014 1.9  1.5 - 6.5 10e3/uL Final  . HGB 12/14/2014 10.5* 13.0 - 17.1 g/dL Final  . HCT 12/14/2014 32.1* 38.4 - 49.9 % Final  . Platelets 12/14/2014 70* 140 - 400 10e3/uL Final  . MCV 12/14/2014 95.3  79.3 - 98.0 fL Final  . MCH 12/14/2014 31.2  27.2 - 33.4 pg Final  . MCHC 12/14/2014 32.7  32.0 - 36.0 g/dL Final  . RBC 12/14/2014 3.37* 4.20 - 5.82 10e6/uL Final  . RDW 12/14/2014 19.6* 11.0 - 14.6 % Final  . lymph# 12/14/2014 0.5* 0.9 - 3.3 10e3/uL Final  . MONO# 12/14/2014 0.4  0.1 - 0.9 10e3/uL Final  . Eosinophils Absolute 12/14/2014 0.0  0.0 - 0.5 10e3/uL Final  . Basophils Absolute 12/14/2014 0.0  0.0 - 0.1 10e3/uL Final  . NEUT% 12/14/2014 69.2  39.0 - 75.0 % Final  . LYMPH% 12/14/2014 16.9  14.0 - 49.0 % Final  . MONO% 12/14/2014  13.9  0.0 - 14.0 % Final  . EOS% 12/14/2014 0.0  0.0 - 7.0 % Final  . BASO% 12/14/2014 0.0  0.0 - 2.0 % Final  . Sodium 12/14/2014 138  136 - 145 mEq/L Final  . Potassium 12/14/2014 4.0  3.5 - 5.1 mEq/L Final  . Chloride 12/14/2014 102  98 - 109 mEq/L Final  . CO2 12/14/2014 27  22 - 29 mEq/L Final  . Glucose 12/14/2014 308* 70 - 140 mg/dl Final  . BUN 12/14/2014 14.2  7.0 - 26.0 mg/dL Final  . Creatinine 12/14/2014 1.0  0.7 - 1.3 mg/dL Final  . Total Bilirubin 12/14/2014 0.83  0.20 - 1.20 mg/dL  Final  . Alkaline Phosphatase 12/14/2014 73  40 - 150 U/L Final  . AST 12/14/2014 23  5 - 34 U/L Final  . ALT 12/14/2014 29  0 - 55 U/L Final  . Total Protein 12/14/2014 5.4* 6.4 - 8.3 g/dL Final  . Albumin 12/14/2014 3.1* 3.5 - 5.0 g/dL Final  . Calcium 12/14/2014 8.2* 8.4 - 10.4 mg/dL Final  . Anion Gap 12/14/2014 9  3 - 11 mEq/L Final  . EGFR 12/14/2014 75* >90 ml/min/1.73 m2 Final   eGFR is calculated using the CKD-EPI Creatinine Equation (2009)     RADIOGRAPHIC STUDIES: No results found.  ASSESSMENT/PLAN:    Adenocarcinoma of left lung, stage 4 Patient received cycle 2 of his carboplatin/Alimta chemotherapy regimen on 11/29/2014.  He has plans to return for cycle 3 of the same regimen on 12/20/2014.  Also, patient is scheduled for labs and a follow up appointment with Dr. Lucianne Lei Tight on 12/14/2014.  Patient will obtain a thoracic and lumbar MRI for further evaluation of some chronic thigh/buttock muscle weakness on 12/28/2014 as well.        History of diabetes mellitus, type II Patient has history of chronic diabetes.  Blood sugar today was elevated to 308.  Patient states that he has been taking his oral diabetes medication only on an intermittent basis.  He states that his primary care physician and instructed patient to take his diabetes medication only when he is taking the prednisone associated with his chemotherapy.  Patient states that he will    to reinitiate his oral  diabetes medication today; and keep a close eye on his blood sugar with daily glucometer checks as well.  Advised patient to follow-up with his primary care provider regarding his diabetes management.    Other pancytopenia Blood draw today reveals that neutropenia has resolved with an ANC of 1.9 today.  Hemoglobin remained stable at 10.5.  However, platelet count has slightly decreased down to 70.  Patient does have multiple bruises to bilateral forearms.  Patient denies any other active issues with bleeding.   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  He has had a left Pleurx drain since September 2015.  Patient states that his wife drains the Pleurx tube every Monday and Thursday.   Patient has a follow-up visit with Dr. Harlin Heys for later this afternoon for follow-up of his left pleural effusion.   Skin rash Patient had a chronic right lower shin area rash for the past 6 weeks or so.  Patient has been applying Vaseline gauzes to area for the past 3 weeks; and right leg rash does appear to be healing very well now.  Area does appear scabbed; with no evidence of infection.  Patient is also noting some erythema and skin breakdown to the area or directly below his left Pleurx drain site.  Does appear that perhaps the Tegaderm holding the tube in place is irritating the skin.  Does appear that the skin irritation has slightly progressed since exam last week.  Patient was given a different type of the adherent dressing to hold the Pleurx drain in place to see if that helps with rash development.  Also, patient has plans to follow-up with Dr. Harlin Heys later this afternoon for follow-up of his left pleural effusion.  Both patient and his wife have plans to review dressing options that may prevent skin irritation with Dr. Harlin Heys as well.    Skin tear  of right forearm without complication Patient does have a skin tear to his right  posterior wrist area.  Patient Selena Batten has a Vaseline gauze and dressing in place.  No evidence of active infection.  Patient stated understanding of all instructions; and was in agreement with this plan of care. The patient knows to call the clinic with any problems, questions or concerns.   Review/collaboration with Dr. [name] regarding all aspects of patient's visit today.   Total time spent with patient was 25 minutes;  with greater than 75 percent of that time spent in face to face counseling regarding his symptoms, and coordination of care and follow up.  Disclaimer: This note was dictated with voice recognition software. Similar sounding words can inadvertently be transcribed and may not be corrected upon review.   Drue Second, NP 12/14/2014

## 2014-12-14 NOTE — Assessment & Plan Note (Signed)
Patient has history of chronic diabetes.  Blood sugar today was elevated to 308.  Patient states that he has been taking his oral diabetes medication only on an intermittent basis.  He states that his primary care physician and instructed patient to take his diabetes medication only when he is taking the prednisone associated with his chemotherapy.  Patient states that he will    to reinitiate his oral diabetes medication today; and keep a close eye on his blood sugar with daily glucometer checks as well.  Advised patient to follow-up with his primary care provider regarding his diabetes management.

## 2014-12-14 NOTE — Assessment & Plan Note (Signed)
Patient received cycle 2 of his carboplatin/Alimta chemotherapy regimen on 11/29/2014.  He has plans to return for cycle 3 of the same regimen on 12/20/2014.  Also, patient is scheduled for labs and a follow up appointment with Dr. Lucianne Lei Tight on 12/14/2014.  Patient will obtain a thoracic and lumbar MRI for further evaluation of some chronic thigh/buttock muscle weakness on 12/28/2014 as well.

## 2014-12-14 NOTE — Progress Notes (Signed)
PCP is [name], MD Referring Provider is Curt Bears, MD  Chief Complaint  Patient presents with  . Pleural Effusion    4 week f/u with CXR, currently draining PleurX on Mon/Thurs.     [id number] returns for routine 4 week left Pleurx catheter check. 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 Since his last visit his Pleurx buying has increased from 400 cc up to 7-800 cc. He is gaining weight and has developed soft tissue edema over his lower extremities. He and and abdomen. He has gained 15-20 pounds of fluid. He is been started on oral Lasix. Echocardiogram performed late August demonstrates good biventricular function. CTA of the thorax performed 6 weeks ago showed no evidence of pericardial effusion. With his increased tissue edema he has developed skin problems both around the Pleurx dressing site as well as in his extremities. He is been receiving steroids regularly. His creatinine is normal. His blood sugar today was 300.   Past Medical History  Diagnosis Date  . History of diabetes mellitus, type II     resolved with diet, h/o neuropathy  . Diverticulosis of colon   . Hyperlipidemia   . Hypertension   . Fatty liver 02/29/00    abd ultrasound  . Lower back pain   . Systolic murmur 8338    2Decho - normal LV fxn, EF 55%, mild AS, biatrial enlargement  . History of tobacco use quit 1990s  . 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 Personal history of colonic adenomas 07/06/2013  . Hypertensive retinopathy of both eyes 2015    mild  . 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 Dysrhythmia     Atrial fib (found 07/2014)  . Shortness of breath   . Pneumonia   . GERD (gastroesophageal reflux disease)     occasional  . Arthritis   . Neuropathy   . Constipation      Past Surgical History  Procedure Laterality Date  . Colonoscopy  2004  . Colonoscopy  2014    tubular adenoma x1, mod diverticulosis (Gessner)  . Flexible bronchoscopy  02/2014    WNL  . Video bronchoscopy Bilateral 03/17/2014    Procedure: VIDEO BRONCHOSCOPY WITH FLUORO;  Surgeon: Tanda Rockers, MD;  Location: [facility];  Service: Cardiopulmonary;  Laterality: Bilateral;  . Chest tube insertion Left 08/26/2014    Procedure: INSERTION OF LEFT  PLEURAL DRAINAGE CATHETER;  Surgeon: Ivin Poot, MD;  Location: [facility] OR;  Service: Thoracic;  Laterality: Left;    Family History  Problem Relation Age of Onset  . Stroke Mother     multiple mini strokes  . Hypertension Mother   . Heart disease Maternal Grandfather     MI  . Stroke Paternal Grandmother   . Colon cancer Neg Hx     Social History History  Substance Use Topics  . Smoking status: Former Smoker -- 3.00 packs/day for 30 years    Types: Cigarettes    Quit date: 12/23/1988  . Smokeless tobacco: Never Used  . Alcohol Use: 12.6 oz/week    21 Glasses of wine per week     Comment: 3-4 wine a day ( 8/03- 2 beers, 2 wines, 2 brandies daily)    Current Outpatient Prescriptions  Medication Sig Dispense Refill  . albuterol (PROVENTIL HFA;VENTOLIN HFA) 108 (90 BASE) MCG/ACT inhaler Inhale 2 puffs into the lungs every 6 (six) hours as needed for wheezing or shortness of breath. 1 Inhaler 0  . Alum & Mag Hydroxide-Simeth (MAGIC MOUTHWASH W/LIDOCAINE) SOLN Take 5 mLs by mouth 3 (three) times daily as needed for mouth pain. 240 mL 0  . amLODipine (NORVASC) 5 MG tablet Take 1 tablet (5 mg total) by mouth daily. 90 tablet 2  . aspirin EC 325 MG tablet Take 325 mg by mouth daily.    Marland Kitchen atenolol (TENORMIN) 25 MG tablet Take 1 tablet (25 mg total) by mouth 2 (two) times daily. 180 tablet 2  . cetirizine (ZYRTEC) 10 MG tablet Take 10 mg by mouth daily as needed for allergies or rhinitis.     . chlorpheniramine-HYDROcodone (TUSSIONEX)  10-8 MG/5ML LQCR Take 5 mLs by mouth every 12 (twelve) hours as needed for cough. 115 mL 0  . dexamethasone (DECADRON) 4 MG tablet 4 mg by mouth twice a day the day before, day of and day after the chemotherapy every 3 weeks. 40 tablet 1  . folic acid (FOLVITE) 1 MG tablet TAKE 1 TABLET BY MOUTH EVERY DAY 30 tablet 4  . furosemide (LASIX) 20 MG tablet Take 20 mg by mouth daily.     Marland Kitchen glimepiride (AMARYL) 1 MG tablet Take 1 tablet (1 mg total) by mouth daily with breakfast. Watch for low sugars 30 tablet 3  . guaiFENesin (MUCINEX) 600 MG 12 hr tablet Take 600 mg by mouth 2 (two) times daily.    Marland Kitchen HYDROcodone-acetaminophen (NORCO/VICODIN) 5-325 MG per tablet Take 1 tablet by mouth every 6 (six) hours as needed for moderate pain.    Marland Kitchen ibuprofen (ADVIL,MOTRIN) 800 MG tablet Take 800 mg by mouth 2 (two) times daily as needed for moderate pain.    Marland Kitchen LORazepam (ATIVAN) 0.5 MG tablet Take 1-2 tabs PO Q 6 hours PRN nausea or anxiety. May make sleepy. 30 tablet 0  . methocarbamol (ROBAXIN) 500 MG tablet Take 500 mg by mouth every 8 (eight) hours as needed (pain).     . Multiple Vitamins-Minerals (EYE VITAMINS PO) Take 1 capsule by mouth 2 (two) times daily. Due to macular degeneration    . polyethylene glycol (MIRALAX / GLYCOLAX) packet Take 8 g by mouth daily.    . prochlorperazine (COMPAZINE) 10 MG tablet Take 1 tablet (10 mg total) by mouth every 6 (six) hours as needed for nausea or vomiting. 60 tablet 0  . sucralfate (CARAFATE) 1 GM/10ML suspension Take 1 g by mouth 4 (four) times daily as needed (reflux).     No current facility-administered medications for this visit.    Allergies  Allergen Reactions  . Candesartan Cilexetil     REACTION: Muscle spasms  . Diltiazem Hcl     REACTION: Dizziness  . Doxazosin Mesylate Other (See Comments)    REACTION: H/A's  . Nifedipine     REACTION: Leg swelling  . Pravastatin Other (See Comments)    Muscle aches  . Red Yeast Rice     Muscle aches  .  Rosuvastatin Other (See Comments)    REACTION: questionable: severe constipation  . Simvastatin Other (See Comments)    REACTION: Muscle aches    Review of Systemsweight gain, increased Pleurx fluid volume, some erythema around the catheter exit site  BP 138/88 mmHg  Pulse 86  Resp 20  Ht 5\' 9"  (1.753 m)  Wt 200 lb (90.719  kg)  BMI 29.52 kg/m2  SpO2 96% Physical Exam Alert and pleasant but with significant soft tissue edema which is new Breath sounds diminished bilaterally Heart rhythm regular without murmur  Pleurx exit site with erythemaSlight crusty drainage this was cultured. He'll be started on oral Keflex. Woody edema of pretibial regions bilaterally Neuro intact  Diagnostic Tests: Chest x-ray shows Pleurx catheter in good position small bilateral pleural effusions. Cardiac size remains unchanged  Impression: Increase Pleurx drainage schedule back to Monday Wednesday Friday because of problems with overall fluid retention. Complete 1 course of oral Keflex for exit site erythema.reduce aspirin from 325 to 81 mg daily.  Plan:return in 4 weeks chest x-ray.

## 2014-12-14 NOTE — Telephone Encounter (Signed)
   Provider input needed: EXTREME FATIGUE, SKIN PROBLEMS, SHORTNESS OF BREATH AND SORE THROAT.   Reason for call:  AS ABOVE  Ears, nose, mouth, throat, and face: positive for sore throat and IT IS BETTER TODAY. NO FEVER   ALLERGIES:  is allergic to candesartan cilexetil; diltiazem hcl; doxazosin mesylate; nifedipine; pravastatin; red yeast rice; rosuvastatin; and simvastatin.  Patient last received chemotherapy/ treatment on 11/29/14 CARBOPLATIN AND ALIMTA  Patient was last seen in the office on 12/07/14  Next appt is 12/20/14.  Is patient having fevers greater than 100.5?  no   Is patient having uncontrolled pain, or new pain? yes, NEW PAIN IN LEFT MID BACK INTERMITTED STINGING.   Is patient having new back pain that changes with position (worsens or eases when laying down?)  no   Is patient able to eat and drink? yes    Is patient able to pass stool without difficulty?   DID NOT ASK.     Is patient having uncontrolled nausea?  no    patient calls 12/14/2014 with complaint of  Ears, nose, mouth, throat, and face: positive for sore throat Respiratory: positive for dyspnea on exertion Genitourinary:positive for decreased stream INTEGUMENT- SKIN ON RIGHT LEG IMPROVED. ABDOMEN NO IMPROVEMENT. RIGHT ARM HAS NEW BROKEN AREAS.   Summary Based on the above information advised patient to  WAIT FOR LAB RESULTS AND POSSIBLE VISIT WITH CINDEE BACON,NP.   [name]  12/14/2014, 8:52 AM   Background Info  Shawn Espinoza   DOB: 08-15-1941   MR#: [id number]   CSN#   [id number] 12/14/2014

## 2014-12-18 LAB — WOUND CULTURE
Gram Stain: NONE SEEN
Gram Stain: NONE SEEN
Gram Stain: NONE SEEN

## 2014-12-19 ENCOUNTER — Other Ambulatory Visit: Payer: Medicare Other

## 2014-12-20 ENCOUNTER — Telehealth: Payer: Self-pay | Admitting: *Deleted

## 2014-12-20 ENCOUNTER — Telehealth: Payer: Self-pay | Admitting: Internal Medicine

## 2014-12-20 ENCOUNTER — Other Ambulatory Visit (HOSPITAL_BASED_OUTPATIENT_CLINIC_OR_DEPARTMENT_OTHER): Payer: Medicare Other

## 2014-12-20 ENCOUNTER — Ambulatory Visit (HOSPITAL_BASED_OUTPATIENT_CLINIC_OR_DEPARTMENT_OTHER): Payer: Medicare Other

## 2014-12-20 ENCOUNTER — Encounter: Payer: Self-pay | Admitting: Internal Medicine

## 2014-12-20 ENCOUNTER — Ambulatory Visit (HOSPITAL_BASED_OUTPATIENT_CLINIC_OR_DEPARTMENT_OTHER): Payer: Medicare Other | Admitting: Internal Medicine

## 2014-12-20 VITALS — BP 127/84 | HR 78 | Temp 98.0°F | Resp 18 | Ht 69.0 in | Wt 201.2 lb

## 2014-12-20 DIAGNOSIS — C3492 Malignant neoplasm of unspecified part of left bronchus or lung: Secondary | ICD-10-CM

## 2014-12-20 DIAGNOSIS — R609 Edema, unspecified: Secondary | ICD-10-CM

## 2014-12-20 DIAGNOSIS — Z5111 Encounter for antineoplastic chemotherapy: Secondary | ICD-10-CM

## 2014-12-20 DIAGNOSIS — C3412 Malignant neoplasm of upper lobe, left bronchus or lung: Secondary | ICD-10-CM

## 2014-12-20 DIAGNOSIS — J90 Pleural effusion, not elsewhere classified: Secondary | ICD-10-CM

## 2014-12-20 DIAGNOSIS — I4891 Unspecified atrial fibrillation: Secondary | ICD-10-CM

## 2014-12-20 LAB — COMPREHENSIVE METABOLIC PANEL (CC13)
ALT: 20 U/L (ref 0–55)
ANION GAP: 11 meq/L (ref 3–11)
AST: 18 U/L (ref 5–34)
Albumin: 3.2 g/dL — ABNORMAL LOW (ref 3.5–5.0)
Alkaline Phosphatase: 68 U/L (ref 40–150)
BILIRUBIN TOTAL: 0.66 mg/dL (ref 0.20–1.20)
BUN: 20.3 mg/dL (ref 7.0–26.0)
CO2: 25 mEq/L (ref 22–29)
CREATININE: 0.9 mg/dL (ref 0.7–1.3)
Calcium: 8.6 mg/dL (ref 8.4–10.4)
Chloride: 104 mEq/L (ref 98–109)
EGFR: 83 mL/min/{1.73_m2} — AB (ref >90)
Glucose: 303 mg/dl — ABNORMAL HIGH (ref 70–140)
Potassium: 4.5 mEq/L (ref 3.5–5.1)
SODIUM: 140 meq/L (ref 136–145)
Total Protein: 5.8 g/dL — ABNORMAL LOW (ref 6.4–8.3)

## 2014-12-20 LAB — CBC WITH DIFFERENTIAL/PLATELET
BASO%: 0 % (ref 0.0–2.0)
BASOS ABS: 0 10*3/uL (ref 0.0–0.1)
EOS ABS: 0 10*3/uL (ref 0.0–0.5)
EOS%: 0 % (ref 0.0–7.0)
HCT: 34 % — ABNORMAL LOW (ref 38.4–49.9)
HEMOGLOBIN: 11.2 g/dL — AB (ref 13.0–17.1)
LYMPH#: 0.3 10*3/uL — AB (ref 0.9–3.3)
LYMPH%: 7.8 % — ABNORMAL LOW (ref 14.0–49.0)
MCH: 31.4 pg (ref 27.2–33.4)
MCHC: 32.9 g/dL (ref 32.0–36.0)
MCV: 95.2 fL (ref 79.3–98.0)
MONO#: 0.5 10*3/uL (ref 0.1–0.9)
MONO%: 11.7 % (ref 0.0–14.0)
NEUT#: 3.1 10*3/uL (ref 1.5–6.5)
NEUT%: 80.5 % — ABNORMAL HIGH (ref 39.0–75.0)
PLATELETS: 227 10*3/uL (ref 140–400)
RBC: 3.57 10*6/uL — ABNORMAL LOW (ref 4.20–5.82)
RDW: 19.5 % — ABNORMAL HIGH (ref 11.0–14.6)
WBC: 3.9 10*3/uL — ABNORMAL LOW (ref 4.0–10.3)

## 2014-12-20 MED ORDER — CYANOCOBALAMIN 1000 MCG/ML IJ SOLN
1000.0000 ug | Freq: Once | INTRAMUSCULAR | Status: AC
  Administered 2014-12-20: 1000 ug via INTRAMUSCULAR

## 2014-12-20 MED ORDER — PEMETREXED DISODIUM CHEMO INJECTION 500 MG
1000.0000 mg | Freq: Once | INTRAVENOUS | Status: AC
  Administered 2014-12-20: 1000 mg via INTRAVENOUS
  Filled 2014-12-20: qty 40

## 2014-12-20 MED ORDER — SODIUM CHLORIDE 0.9 % IV SOLN: 500.0000 mg/m2 | Freq: Once | INTRAVENOUS | Status: DC

## 2014-12-20 MED ORDER — CYANOCOBALAMIN 1000 MCG/ML IJ SOLN
INTRAMUSCULAR | Status: AC
  Filled 2014-12-20: qty 1

## 2014-12-20 MED ORDER — ONDANSETRON 16 MG/50ML IVPB (CHCC)
16.0000 mg | Freq: Once | INTRAVENOUS | Status: AC
  Administered 2014-12-20: 16 mg via INTRAVENOUS

## 2014-12-20 MED ORDER — DEXAMETHASONE SODIUM PHOSPHATE 20 MG/5ML IJ SOLN
INTRAMUSCULAR | Status: AC
  Filled 2014-12-20: qty 5

## 2014-12-20 MED ORDER — ONDANSETRON 16 MG/50ML IVPB (CHCC)
INTRAVENOUS | Status: AC
  Filled 2014-12-20: qty 16

## 2014-12-20 MED ORDER — SODIUM CHLORIDE 0.9 % IV SOLN
514.5000 mg | Freq: Once | INTRAVENOUS | Status: AC
  Administered 2014-12-20: 510 mg via INTRAVENOUS
  Filled 2014-12-20: qty 51

## 2014-12-20 MED ORDER — CARBOPLATIN CHEMO INTRADERMAL TEST DOSE 100MCG/0.02ML
100.0000 ug | Freq: Once | INTRADERMAL | Status: AC
  Administered 2014-12-20: 100 ug via INTRADERMAL
  Filled 2014-12-20: qty 0.01

## 2014-12-20 MED ORDER — SODIUM CHLORIDE 0.9 % IV SOLN
Freq: Once | INTRAVENOUS | Status: AC
  Administered 2014-12-20: 12:00:00 via INTRAVENOUS

## 2014-12-20 MED ORDER — DEXAMETHASONE SODIUM PHOSPHATE 20 MG/5ML IJ SOLN
20.0000 mg | Freq: Once | INTRAMUSCULAR | Status: AC
  Administered 2014-12-20: 20 mg via INTRAVENOUS

## 2014-12-20 NOTE — Telephone Encounter (Signed)
Per staff message and POF I have scheduled appts. Advised scheduler of appts. JMW  

## 2014-12-20 NOTE — Progress Notes (Signed)
[facility] [phone]   [phone] [place], [place] [address]  DIAGNOSIS: Stage IIIB/IV non-small cell lung cancer, adenocarcinoma diagnosed in March of 2015.  PRIOR THERAPY:  1) Systemic chemotherapy with carboplatin for AUC of 5 and Alimta 500 mg/M2 every 3 weeks. First dose expected on 04/13/2014. Status post 6 cycles. 2)  Maintenance chemotherapy with single agent Alimta 500 mg/M2 every 3 weeks. First cycle on 09/06/2014. Status post 1 cycle.  CURRENT THERAPY: Second course of systemic chemotherapy with carboplatin for AUC of 5 and Alimta 500 MG/M2 every 3 weeks. First cycle 11/08/2014. Status post 2 cycles.  INTERVAL HISTORY: Shawn Espinoza 73 y.o. male returns to the clinic today for follow up visit accompanied by his wife. He is tolerating his treatment well with no significant adverse effects. He denied having any significant chest pain but continues to have shortness of breath and mild cough with no hemoptysis. He has no significant fever or chills, no nausea or vomiting. He continues with drainage of the Pleurx catheter up to 800 mL 3 times a week. He is also complaining of swelling of his lower extremities and he is currently on Lasix 20 mg by mouth daily. The patient was seen recently by Dr. Prescott Gum and treated with a course of Keflex for questionable skin infection at the Pleurx catheter exit site.  MEDICAL HISTORY: Past Medical History  Diagnosis Date  . History of diabetes mellitus, type II     resolved with diet, h/o neuropathy  . Diverticulosis of colon   . Hyperlipidemia   . Hypertension   . Fatty liver 02/29/00    abd ultrasound  . Lower back pain   . Systolic murmur 6440    2Decho - normal LV fxn, EF 55%, mild AS, biatrial enlargement  . History of tobacco use quit 1990s  . Positive hepatitis C antibody test 2013    HCV RNA negative - ?cleared infection    . 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 Personal history of colonic adenomas 07/06/2013  . Hypertensive retinopathy of both eyes 2015    mild  . 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 Dysrhythmia     Atrial fib (found 07/2014)  . Shortness of breath   . Pneumonia   . GERD (gastroesophageal reflux disease)     occasional  . Arthritis   . Neuropathy   . Constipation     ALLERGIES:  is allergic to candesartan cilexetil; diltiazem hcl; doxazosin mesylate; nifedipine; pravastatin; red yeast rice; rosuvastatin; and simvastatin.  MEDICATIONS:  Current Outpatient Prescriptions  Medication Sig Dispense Refill  . albuterol (PROVENTIL HFA;VENTOLIN HFA) 108 (90 BASE) MCG/ACT inhaler Inhale 2 puffs into the lungs every 6 (six) hours as needed for wheezing or shortness of breath. 1 Inhaler 0  . Alum & Mag Hydroxide-Simeth (MAGIC MOUTHWASH W/LIDOCAINE) SOLN Take 5 mLs by mouth 3 (three) times daily as needed for mouth pain. 240 mL 0  . amLODipine (NORVASC) 5 MG tablet Take 1 tablet (5 mg total) by mouth daily. 90 tablet 2  . aspirin EC 325 MG tablet Take 325 mg by mouth daily.    Marland Kitchen atenolol (TENORMIN) 25 MG tablet Take 1 tablet (25 mg total) by mouth 2 (two) times daily. 180 tablet 2  . cetirizine (ZYRTEC) 10 MG tablet Take  10 mg by mouth daily as needed for allergies or rhinitis.     . chlorpheniramine-HYDROcodone (TUSSIONEX) 10-8 MG/5ML LQCR Take 5 mLs by mouth every 12 (twelve) hours as needed for cough. 115 mL 0  . dexamethasone (DECADRON) 4 MG tablet 4 mg by mouth twice a day the day before, day of and day after the chemotherapy every 3 weeks. 40 tablet 1  . folic acid (FOLVITE) 1 MG tablet TAKE 1 TABLET BY MOUTH EVERY DAY 30 tablet 4  . furosemide (LASIX) 20 MG tablet Take 20 mg by mouth daily.     Marland Kitchen glimepiride (AMARYL) 1 MG tablet Take 1 tablet (1 mg total) by mouth daily with breakfast. Watch for low sugars 30 tablet 3  .  guaiFENesin (MUCINEX) 600 MG 12 hr tablet Take 600 mg by mouth 2 (two) times daily.    Marland Kitchen HYDROcodone-acetaminophen (NORCO/VICODIN) 5-325 MG per tablet Take 1 tablet by mouth every 6 (six) hours as needed for moderate pain.    Marland Kitchen ibuprofen (ADVIL,MOTRIN) 800 MG tablet Take 800 mg by mouth 2 (two) times daily as needed for moderate pain.    Marland Kitchen LORazepam (ATIVAN) 0.5 MG tablet Take 1-2 tabs PO Q 6 hours PRN nausea or anxiety. May make sleepy. 30 tablet 0  . methocarbamol (ROBAXIN) 500 MG tablet Take 500 mg by mouth every 8 (eight) hours as needed (pain).     . Multiple Vitamins-Minerals (EYE VITAMINS PO) Take 1 capsule by mouth 2 (two) times daily. Due to macular degeneration    . polyethylene glycol (MIRALAX / GLYCOLAX) packet Take 8 g by mouth daily.    . prochlorperazine (COMPAZINE) 10 MG tablet Take 1 tablet (10 mg total) by mouth every 6 (six) hours as needed for nausea or vomiting. 60 tablet 0  . sucralfate (CARAFATE) 1 GM/10ML suspension Take 1 g by mouth 4 (four) times daily as needed (reflux).     No current facility-administered medications for this visit.    SURGICAL HISTORY:  Past Surgical History  Procedure Laterality Date  . Colonoscopy  2004  . Colonoscopy  2014    tubular adenoma x1, mod diverticulosis (Gessner)  . Flexible bronchoscopy  02/2014    WNL  . Video bronchoscopy Bilateral 03/17/2014    Procedure: VIDEO BRONCHOSCOPY WITH FLUORO;  Surgeon: Tanda Rockers, MD;  Location: [facility];  Service: Cardiopulmonary;  Laterality: Bilateral;  . Chest tube insertion Left 08/26/2014    Procedure: INSERTION OF LEFT  PLEURAL DRAINAGE CATHETER;  Surgeon: Ivin Poot, MD;  Location: [facility];  Service: Thoracic;  Laterality: Left;    REVIEW OF SYSTEMS:  A comprehensive review of systems was negative except for: Constitutional: positive for anorexia and fatigue Respiratory: positive for cough and dyspnea on exertion   PHYSICAL EXAMINATION: General appearance: alert, cooperative,  fatigued and no distress Head: Normocephalic, without obvious abnormality, atraumatic Neck: no adenopathy, no JVD, supple, symmetrical, trachea midline and thyroid not enlarged, symmetric, no tenderness/mass/nodules Lymph nodes: Cervical, supraclavicular, and axillary nodes normal. Resp: diminished breath sounds LLL and dullness to percussion LLL Back: symmetric, no curvature. ROM normal. No CVA tenderness. Cardio: regular rate and rhythm, S1, S2 normal, no murmur, click, rub or gallop GI: soft, non-tender; bowel sounds normal; no masses,  no organomegaly Extremities: edema 2+ Neurologic: Alert and oriented X 3, normal strength and tone. Normal symmetric reflexes. Normal coordination and gait  ECOG PERFORMANCE STATUS: 1 - Symptomatic but completely ambulatory  Blood pressure 127/84, pulse 78, temperature 98 F (36.7 C),  temperature source Oral, resp. rate 18, height 5\' 9"  (1.753 m), weight 201 lb 3.2 oz (91.264 kg), SpO2 100 %.  LABORATORY DATA: Lab Results  Component Value Date   WBC 3.9* 12/20/2014   HGB 11.2* 12/20/2014   HCT 34.0* 12/20/2014   MCV 95.2 12/20/2014   PLT 227 12/20/2014      Chemistry      Component Value Date/Time   NA 140 12/20/2014 1025   NA 140 08/26/2014 1246   K 4.5 12/20/2014 1025   K 4.5 08/26/2014 1246   CL 100 08/26/2014 1246   CO2 25 12/20/2014 1025   CO2 29 08/26/2014 1246   BUN 20.3 12/20/2014 1025   BUN 16 08/26/2014 1246   CREATININE 0.9 12/20/2014 1025   CREATININE 0.76 08/26/2014 1246   CREATININE 0.83 02/22/2014 1314      Component Value Date/Time   CALCIUM 8.6 12/20/2014 1025   CALCIUM 8.7 08/26/2014 1246   ALKPHOS 68 12/20/2014 1025   ALKPHOS 68 08/13/2014 1222   AST 18 12/20/2014 1025   AST 21 08/13/2014 1222   ALT 20 12/20/2014 1025   ALT 15 08/13/2014 1222   BILITOT 0.66 12/20/2014 1025   BILITOT 1.3* 08/13/2014 1222       RADIOGRAPHIC STUDIES: Dg Chest 2 View  12/14/2014   CLINICAL DATA:  CT chest 11/07/2014.  Chest  x-ray 10/04/2014.  EXAM: CHEST  2 VIEW  COMPARISON:  None.  FINDINGS: Mediastinum and hilar structures are normal. Heart size normal. Left chest tube is noted. Small left pleural effusion. No pneumothorax. Small right pleural effusion is noted. Heart size normal. Pulmonary vascularity normal. No acute bony abnormality.  IMPRESSION: 1. Left chest tube noted. Small left pleural effusion. No pneumothorax. 2. Tiny right pleural effusion.   Electronically Signed   By: Marcello Moores  Register   On: 12/14/2014 16:17   ASSESSMENT AND PLAN: this is a very pleasant 73 years old white male with:  1) Stage IV non-small cell lung cancer, adenocarcinoma presenting with large right upper lobe lung mass in addition to mediastinal and bilateral hilar lymphadenopathy as well as cervical lymphadenopathy and left pleural effusion. The patient completed a course systemic chemotherapy with carboplatin and Alimta status post 6 cycles. This was followed by 3 cycles of maintenance chemotherapy with single agent Alimta. The patient tolerated his treatment well. He had evidence for disease progression and the patient was started on treatment again with carboplatin and Alimta status post 2 cycles. He tolerated the second cycle of his treatment well. We will proceed with cycle #3 today as scheduled. He would come back for follow-up visit in 3 weeks after repeating CT scan of the chest, abdomen and pelvis for restaging of his disease.  2) Recurrent left pleural effusion: He is status post Pleurx catheter placement. Continue drainage of the pleural fluid 3 times a week as recommended by Dr. Prescott Gum   3) Atrial fibrillation: continue Aliquis.  4) lower extremity edema: I recommended for the patient to increase his dose of Lasix to 40 mg by mouth daily for the next 7-10 days. He was also advised to take potassium supplements on daily basis.  The patient would come back for followup visit in 3 weeks with the start of the next cycle of  his chemotherapy for evaluation and management any adverse effect of his treatment.  He was advised to call immediately if he has any concerning symptoms in the interval.  The patient voices understanding of current disease status and treatment  options and is in agreement with the current care plan.  All questions were answered. The patient knows to call the clinic with any problems, questions or concerns. We can certainly see the patient much sooner if necessary.  Disclaimer: This note was dictated with voice recognition software. Similar sounding words can inadvertently be transcribed and may not be corrected upon review.

## 2014-12-20 NOTE — Patient Instructions (Addendum)
[facility] Discharge Instructions for Patients Receiving Chemotherapy  Today you received the following chemotherapy agents Alimta/Carboplatin  To help prevent nausea and vomiting after your treatment, we encourage you to take your nausea medication as prescribed.   If you develop nausea and vomiting that is not controlled by your nausea medication, call the clinic.   BELOW ARE SYMPTOMS THAT SHOULD BE REPORTED IMMEDIATELY:  *FEVER GREATER THAN 100.5 F  *CHILLS WITH OR WITHOUT FEVER  NAUSEA AND VOMITING THAT IS NOT CONTROLLED WITH YOUR NAUSEA MEDICATION  *UNUSUAL SHORTNESS OF BREATH  *UNUSUAL BRUISING OR BLEEDING  TENDERNESS IN MOUTH AND THROAT WITH OR WITHOUT PRESENCE OF ULCERS  *URINARY PROBLEMS  *BOWEL PROBLEMS  UNUSUAL RASH Items with * indicate a potential emergency and should be followed up as soon as possible.  Feel free to call the clinic you have any questions or concerns. The clinic phone number is [phone].

## 2014-12-20 NOTE — Telephone Encounter (Signed)
Pt confirmed labs/ov per 12/29 POF, gave pt AVS.... KJ and sent msg to add chemo and that central will call to confirm CT scan... KJ

## 2014-12-21 ENCOUNTER — Other Ambulatory Visit: Payer: Self-pay | Admitting: Family Medicine

## 2014-12-21 MED ORDER — GLIMEPIRIDE 2 MG PO TABS: 2.0000 mg | ORAL_TABLET | Freq: Every day | ORAL | Status: DC

## 2014-12-22 ENCOUNTER — Telehealth: Payer: Self-pay | Admitting: Internal Medicine

## 2014-12-22 NOTE — Telephone Encounter (Signed)
error 

## 2014-12-22 NOTE — Telephone Encounter (Signed)
pt wife called about weekly labs....done...pt awareof all d.t

## 2014-12-28 ENCOUNTER — Ambulatory Visit
Admission: RE | Admit: 2014-12-28 | Discharge: 2014-12-28 | Disposition: A | Discharge status: Home / Self Care | Payer: Medicare Other | Source: Ambulatory Visit | Attending: Physician Assistant | Admitting: Physician Assistant

## 2014-12-28 ENCOUNTER — Other Ambulatory Visit (HOSPITAL_BASED_OUTPATIENT_CLINIC_OR_DEPARTMENT_OTHER): Payer: Medicare Other

## 2014-12-28 ENCOUNTER — Ambulatory Visit (INDEPENDENT_AMBULATORY_CARE_PROVIDER_SITE_OTHER): Payer: Medicare Other | Admitting: Ophthalmology

## 2014-12-28 DIAGNOSIS — H35033 Hypertensive retinopathy, bilateral: Secondary | ICD-10-CM

## 2014-12-28 DIAGNOSIS — M5126 Other intervertebral disc displacement, lumbar region: Secondary | ICD-10-CM | POA: Diagnosis not present

## 2014-12-28 DIAGNOSIS — E11319 Type 2 diabetes mellitus with unspecified diabetic retinopathy without macular edema: Secondary | ICD-10-CM

## 2014-12-28 DIAGNOSIS — M47816 Spondylosis without myelopathy or radiculopathy, lumbar region: Secondary | ICD-10-CM | POA: Diagnosis not present

## 2014-12-28 DIAGNOSIS — H2513 Age-related nuclear cataract, bilateral: Secondary | ICD-10-CM | POA: Diagnosis not present

## 2014-12-28 DIAGNOSIS — H43813 Vitreous degeneration, bilateral: Secondary | ICD-10-CM

## 2014-12-28 DIAGNOSIS — M79605 Pain in left leg: Principal | ICD-10-CM

## 2014-12-28 DIAGNOSIS — C3412 Malignant neoplasm of upper lobe, left bronchus or lung: Secondary | ICD-10-CM

## 2014-12-28 DIAGNOSIS — M79604 Pain in right leg: Secondary | ICD-10-CM

## 2014-12-28 DIAGNOSIS — I10 Essential (primary) hypertension: Secondary | ICD-10-CM | POA: Diagnosis not present

## 2014-12-28 DIAGNOSIS — M47814 Spondylosis without myelopathy or radiculopathy, thoracic region: Secondary | ICD-10-CM | POA: Diagnosis not present

## 2014-12-28 DIAGNOSIS — E11329 Type 2 diabetes mellitus with mild nonproliferative diabetic retinopathy without macular edema: Secondary | ICD-10-CM

## 2014-12-28 DIAGNOSIS — C3492 Malignant neoplasm of unspecified part of left bronchus or lung: Secondary | ICD-10-CM

## 2014-12-28 DIAGNOSIS — H3531 Nonexudative age-related macular degeneration: Secondary | ICD-10-CM

## 2014-12-28 LAB — COMPREHENSIVE METABOLIC PANEL (CC13)
ALBUMIN: 3.3 g/dL — AB (ref 3.5–5.0)
ALT: 29 U/L (ref 0–55)
ANION GAP: 8 meq/L (ref 3–11)
AST: 23 U/L (ref 5–34)
Alkaline Phosphatase: 74 U/L (ref 40–150)
BUN: 29.1 mg/dL — ABNORMAL HIGH (ref 7.0–26.0)
CHLORIDE: 103 meq/L (ref 98–109)
CO2: 28 mEq/L (ref 22–29)
CREATININE: 0.8 mg/dL (ref 0.7–1.3)
Calcium: 8.8 mg/dL (ref 8.4–10.4)
EGFR: 87 mL/min/{1.73_m2} — AB (ref >90)
Glucose: 201 mg/dl — ABNORMAL HIGH (ref 70–140)
Potassium: 4.1 mEq/L (ref 3.5–5.1)
Sodium: 140 mEq/L (ref 136–145)
TOTAL PROTEIN: 5.6 g/dL — AB (ref 6.4–8.3)
Total Bilirubin: 0.7 mg/dL (ref 0.20–1.20)

## 2014-12-28 LAB — CBC WITH DIFFERENTIAL/PLATELET
BASO%: 0.7 % (ref 0.0–2.0)
Basophils Absolute: 0 10*3/uL (ref 0.0–0.1)
EOS%: 1.3 % (ref 0.0–7.0)
Eosinophils Absolute: 0 10*3/uL (ref 0.0–0.5)
HEMATOCRIT: 30.9 % — AB (ref 38.4–49.9)
HGB: 10.3 g/dL — ABNORMAL LOW (ref 13.0–17.1)
LYMPH%: 33.6 % (ref 14.0–49.0)
MCH: 31.3 pg (ref 27.2–33.4)
MCHC: 33.3 g/dL (ref 32.0–36.0)
MCV: 93.9 fL (ref 79.3–98.0)
MONO#: 0.2 10*3/uL (ref 0.1–0.9)
MONO%: 13.2 % (ref 0.0–14.0)
NEUT#: 0.8 10*3/uL — ABNORMAL LOW (ref 1.5–6.5)
NEUT%: 51.2 % (ref 39.0–75.0)
Platelets: 105 10*3/uL — ABNORMAL LOW (ref 140–400)
RBC: 3.29 10*6/uL — ABNORMAL LOW (ref 4.20–5.82)
RDW: 16 % — ABNORMAL HIGH (ref 11.0–14.6)
WBC: 1.5 10*3/uL — ABNORMAL LOW (ref 4.0–10.3)
lymph#: 0.5 10*3/uL — ABNORMAL LOW (ref 0.9–3.3)

## 2014-12-28 MED ORDER — GADOBENATE DIMEGLUMINE 529 MG/ML IV SOLN
17.0000 mL | Freq: Once | INTRAVENOUS | Status: AC | PRN
  Administered 2014-12-28: 17 mL via INTRAVENOUS

## 2014-12-29 ENCOUNTER — Telehealth: Payer: Self-pay | Admitting: Family Medicine

## 2014-12-29 ENCOUNTER — Encounter: Payer: Self-pay | Admitting: Family Medicine

## 2014-12-29 ENCOUNTER — Ambulatory Visit (INDEPENDENT_AMBULATORY_CARE_PROVIDER_SITE_OTHER): Payer: Medicare Other | Admitting: Internal Medicine

## 2014-12-29 ENCOUNTER — Encounter: Payer: Self-pay | Admitting: Internal Medicine

## 2014-12-29 VITALS — BP 130/80 | HR 72 | Temp 98.6°F | Wt 195.0 lb

## 2014-12-29 DIAGNOSIS — N489 Disorder of penis, unspecified: Secondary | ICD-10-CM | POA: Diagnosis not present

## 2014-12-29 DIAGNOSIS — R21 Rash and other nonspecific skin eruption: Secondary | ICD-10-CM | POA: Insufficient documentation

## 2014-12-29 MED ORDER — FLUCONAZOLE 150 MG PO TABS: 150.0000 mg | ORAL_TABLET | Freq: Once | ORAL | Status: DC

## 2014-12-29 MED ORDER — KETOCONAZOLE 2 % EX CREA: 1.0000 "application " | TOPICAL_CREAM | Freq: Two times a day (BID) | CUTANEOUS | Status: DC

## 2014-12-29 NOTE — Assessment & Plan Note (Signed)
This really looks fungal, and he has had recent antibiotics Will treat with single dose fluconazole--with prn repeat Ketoconazole cream ANC ~750 yesterday

## 2014-12-29 NOTE — Telephone Encounter (Signed)
Noted. Pt to see Dr Silvio Pate today.

## 2014-12-29 NOTE — Telephone Encounter (Signed)
Patient Name: Shawn Espinoza  DOB: 07-05-1941    Nurse Assessment  Nurse: Ronnald Ramp, RN, Miranda Date/Time (Eastern Time): 12/29/2014 8:53:46 AM  Confirm and document reason for call. If symptomatic, describe symptoms. ---Caller states he husband has redness with bleeding on the shaft of his penis.  Has the patient traveled out of the country within the last 30 days? ---Not Applicable  Does the patient require triage? ---Yes  Related visit to physician within the last 2 weeks? ---No  Does the PT have any chronic conditions? (i.e. diabetes, asthma, etc.) ---Yes  List chronic conditions. ---Lung Cancer, External drain, HTN     Guidelines    Guideline Title Affirmed Question Affirmed Notes  Penis and Scrotum Symptoms Looks infected (e.g., draining sore, ulcer, rash is painful to touch)    Final Disposition User   See Physician within 24 Hours Jones, RN, Miranda    Comments  Pt already has a 430 appt.

## 2014-12-29 NOTE — Progress Notes (Signed)
Pre visit review using our clinic review tool, if applicable. No additional management support is needed unless otherwise documented below in the visit note. 

## 2014-12-29 NOTE — Progress Notes (Signed)
Subjective:    Patient ID: Shawn Espinoza, male    DOB: February 20, 1941, 74 y.o.   MRN: [id number]  HPI Here due to redness at glans of penis  Has had sore and redness there for 3-4 weeks Bad fluid around his waist despite the lasix--this has helped the scrotal edema Stays wet--- and almost looks burned  Has been using vaseline Used skin prep this morning--got blood back Some help with antibiotic ointment  Current Outpatient Prescriptions on File Prior to Visit  Medication Sig Dispense Refill  . albuterol (PROVENTIL HFA;VENTOLIN HFA) 108 (90 BASE) MCG/ACT inhaler Inhale 2 puffs into the lungs every 6 (six) hours as needed for wheezing or shortness of breath. 1 Inhaler 0  . amLODipine (NORVASC) 5 MG tablet Take 1 tablet (5 mg total) by mouth daily. 90 tablet 2  . atenolol (TENORMIN) 25 MG tablet Take 1 tablet (25 mg total) by mouth 2 (two) times daily. 180 tablet 2  . cetirizine (ZYRTEC) 10 MG tablet Take 10 mg by mouth daily as needed for allergies or rhinitis.     Marland Kitchen dexamethasone (DECADRON) 4 MG tablet 4 mg by mouth twice a day the day before, day of and day after the chemotherapy every 3 weeks. 40 tablet 1  . folic acid (FOLVITE) 1 MG tablet TAKE 1 TABLET BY MOUTH EVERY DAY 30 tablet 4  . furosemide (LASIX) 20 MG tablet Take 20 mg by mouth daily.     Marland Kitchen glimepiride (AMARYL) 2 MG tablet Take 1 tablet (2 mg total) by mouth daily with breakfast. Watch for low sugars 30 tablet 3  . guaiFENesin (MUCINEX) 600 MG 12 hr tablet Take 600 mg by mouth 2 (two) times daily.    Marland Kitchen HYDROcodone-acetaminophen (NORCO/VICODIN) 5-325 MG per tablet Take 1 tablet by mouth every 6 (six) hours as needed for moderate pain.    Marland Kitchen ibuprofen (ADVIL,MOTRIN) 800 MG tablet Take 800 mg by mouth 2 (two) times daily as needed for moderate pain.    . methocarbamol (ROBAXIN) 500 MG tablet Take 500 mg by mouth every 8 (eight) hours as needed (pain).     . polyethylene glycol (MIRALAX / GLYCOLAX) packet Take 8 g by mouth  daily.    . sucralfate (CARAFATE) 1 GM/10ML suspension Take 1 g by mouth 4 (four) times daily as needed (reflux).     No current facility-administered medications on file prior to visit.    Allergies  Allergen Reactions  . Candesartan Cilexetil     REACTION: Muscle spasms  . Diltiazem Hcl     REACTION: Dizziness  . Doxazosin Mesylate Other (See Comments)    REACTION: H/A's  . Nifedipine     REACTION: Leg swelling  . Pravastatin Other (See Comments)    Muscle aches  . Red Yeast Rice     Muscle aches  . Rosuvastatin Other (See Comments)    REACTION: questionable: severe constipation  . Simvastatin Other (See Comments)    REACTION: Muscle aches    Past Medical History  Diagnosis Date  . History of diabetes mellitus, type II     resolved with diet, h/o neuropathy  . Diverticulosis of colon   . Hyperlipidemia   . Hypertension   . Fatty liver 02/29/00    abd ultrasound  . Lower back pain   . Systolic murmur 5784    2Decho - normal LV fxn, EF 55%, mild AS, biatrial enlargement  . History of tobacco use quit 1990s  . Positive hepatitis C antibody  test 2013    HCV RNA negative - ?cleared infection  . 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 Personal history of colonic adenomas 07/06/2013  . Hypertensive retinopathy of both eyes 2015    mild  . 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 Dysrhythmia     Atrial fib (found 07/2014)  . Shortness of breath   . Pneumonia   . GERD (gastroesophageal reflux disease)     occasional  . Arthritis   . Neuropathy   . Constipation   . 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 Colonoscopy  2004  . Colonoscopy  2014    tubular adenoma x1, mod diverticulosis (Gessner)  . Flexible bronchoscopy  02/2014    WNL  . Video bronchoscopy Bilateral 03/17/2014    Procedure: VIDEO BRONCHOSCOPY WITH FLUORO;   Surgeon: Tanda Rockers, MD;  Location: [facility];  Service: Cardiopulmonary;  Laterality: Bilateral;  . Chest tube insertion Left 08/26/2014    Procedure: INSERTION OF LEFT  PLEURAL DRAINAGE CATHETER;  Surgeon: Ivin Poot, MD;  Location: [facility] OR;  Service: Thoracic;  Laterality: Left;    Family History  Problem Relation Age of Onset  . Stroke Mother     multiple mini strokes  . Hypertension Mother   . Heart disease Maternal Grandfather     MI  . Stroke Paternal Grandmother   . Colon cancer Neg Hx     History   Social History  . Marital Status: Married    Spouse Name: N/A    Number of Children: 2  . Years of Education: N/A   Occupational History  . [employer], picks up golf balls.  [employer]    Social History Main Topics  . Smoking status: Former Smoker -- 3.00 packs/day for 30 years    Types: Cigarettes    Quit date: 12/23/1988  . Smokeless tobacco: Never Used  . Alcohol Use: 12.6 oz/week    21 Glasses of wine per week     Comment: 3-4 wine a day ( 8/03- 2 beers, 2 wines, 2 brandies daily)  . Drug Use: No  . Sexual Activity: Not on file   Other Topics Concern  . Not on file   Social History Narrative   Lives with wife.   [employer], failed eye exam, quit   Activity: active at gym regularly, golfing   Diet: healthy - oatmeal, avoids white starches      Advanced directives: Full code. Wouldn't want prolonged artificial life support. Wife would be HCproxy   Review of Systems  No fever Recent antibiotics for skin infection and rash by pleural catheter     Objective:   Physical Exam  Constitutional: He appears well-developed and well-nourished. No distress.  Genitourinary:  Mild maceration and open areas at border of glans and shaft of penis No sig surrounding redness or warmth (doesn't look bacterial)          Assessment & Plan:

## 2015-01-03 ENCOUNTER — Telehealth: Payer: Self-pay | Admitting: *Deleted

## 2015-01-03 DIAGNOSIS — C3492 Malignant neoplasm of unspecified part of left bronchus or lung: Secondary | ICD-10-CM

## 2015-01-03 NOTE — Addendum Note (Signed)
Addended by: Simon Rhein L on: 01/03/2015 03:26 PM   Modules accepted: Orders

## 2015-01-03 NOTE — Telephone Encounter (Signed)
Pt's wife called left a msg requesting for pt to be seen.  He has had a temp off and on about 100.1, intermittent chills, dizziness, and has increased SOB.  Per Dr Vista Mink, okay for pt to see Selena Lesser.  Called, no answer, left msg and asked them to call us back to schedule an appt with Cyndee.

## 2015-01-03 NOTE — Telephone Encounter (Signed)
Spoke to pt's wife, appt made with Selena Lesser tomorrow.  She is aware of appt.  Type and hold added for lab work tomorrow.

## 2015-01-04 ENCOUNTER — Telehealth: Payer: Self-pay | Admitting: Family Medicine

## 2015-01-04 ENCOUNTER — Telehealth: Payer: Self-pay | Admitting: Nurse Practitioner

## 2015-01-04 ENCOUNTER — Encounter: Payer: Self-pay | Admitting: Nurse Practitioner

## 2015-01-04 ENCOUNTER — Other Ambulatory Visit (HOSPITAL_BASED_OUTPATIENT_CLINIC_OR_DEPARTMENT_OTHER): Payer: Medicare Other

## 2015-01-04 ENCOUNTER — Ambulatory Visit (HOSPITAL_BASED_OUTPATIENT_CLINIC_OR_DEPARTMENT_OTHER): Payer: Medicare Other | Admitting: Nurse Practitioner

## 2015-01-04 VITALS — BP 124/69 | HR 76 | Temp 98.0°F | Resp 20 | Ht 69.0 in | Wt 199.9 lb

## 2015-01-04 DIAGNOSIS — R609 Edema, unspecified: Secondary | ICD-10-CM

## 2015-01-04 DIAGNOSIS — C3492 Malignant neoplasm of unspecified part of left bronchus or lung: Secondary | ICD-10-CM

## 2015-01-04 DIAGNOSIS — I951 Orthostatic hypotension: Secondary | ICD-10-CM | POA: Diagnosis not present

## 2015-01-04 DIAGNOSIS — J90 Pleural effusion, not elsewhere classified: Secondary | ICD-10-CM | POA: Diagnosis not present

## 2015-01-04 DIAGNOSIS — C3412 Malignant neoplasm of upper lobe, left bronchus or lung: Secondary | ICD-10-CM | POA: Diagnosis not present

## 2015-01-04 DIAGNOSIS — R600 Localized edema: Secondary | ICD-10-CM | POA: Insufficient documentation

## 2015-01-04 LAB — CBC WITH DIFFERENTIAL/PLATELET
BASO%: 0 % (ref 0.0–2.0)
BASOS ABS: 0 10*3/uL (ref 0.0–0.1)
EOS%: 2.1 % (ref 0.0–7.0)
Eosinophils Absolute: 0 10*3/uL (ref 0.0–0.5)
HEMATOCRIT: 27.2 % — AB (ref 38.4–49.9)
HGB: 8.8 g/dL — ABNORMAL LOW (ref 13.0–17.1)
LYMPH#: 0.5 10*3/uL — AB (ref 0.9–3.3)
LYMPH%: 25 % (ref 14.0–49.0)
MCH: 30.7 pg (ref 27.2–33.4)
MCHC: 32.4 g/dL (ref 32.0–36.0)
MCV: 94.8 fL (ref 79.3–98.0)
MONO#: 0.2 10*3/uL (ref 0.1–0.9)
MONO%: 10.9 % (ref 0.0–14.0)
NEUT#: 1.2 10*3/uL — ABNORMAL LOW (ref 1.5–6.5)
NEUT%: 62 % (ref 39.0–75.0)
NRBC: 2 % — AB (ref 0–0)
Platelets: 91 10*3/uL — ABNORMAL LOW (ref 140–400)
RBC: 2.87 10*6/uL — AB (ref 4.20–5.82)
RDW: 16.5 % — ABNORMAL HIGH (ref 11.0–14.6)
WBC: 1.9 10*3/uL — ABNORMAL LOW (ref 4.0–10.3)

## 2015-01-04 LAB — COMPREHENSIVE METABOLIC PANEL (CC13)
ALT: 17 U/L (ref 0–55)
ANION GAP: 9 meq/L (ref 3–11)
AST: 18 U/L (ref 5–34)
Albumin: 2.9 g/dL — ABNORMAL LOW (ref 3.5–5.0)
Alkaline Phosphatase: 62 U/L (ref 40–150)
BUN: 21.3 mg/dL (ref 7.0–26.0)
CO2: 26 mEq/L (ref 22–29)
CREATININE: 0.9 mg/dL (ref 0.7–1.3)
Calcium: 8.1 mg/dL — ABNORMAL LOW (ref 8.4–10.4)
Chloride: 104 mEq/L (ref 98–109)
EGFR: 81 mL/min/{1.73_m2} — ABNORMAL LOW (ref >90)
Glucose: 215 mg/dl — ABNORMAL HIGH (ref 70–140)
Potassium: 4.3 mEq/L (ref 3.5–5.1)
Sodium: 138 mEq/L (ref 136–145)
Total Bilirubin: 0.72 mg/dL (ref 0.20–1.20)
Total Protein: 5.6 g/dL — ABNORMAL LOW (ref 6.4–8.3)

## 2015-01-04 LAB — HOLD TUBE, BLOOD BANK

## 2015-01-04 NOTE — Telephone Encounter (Signed)
Patient's wife,[name], returned your call. Please call patient back at  [phone].

## 2015-01-04 NOTE — Progress Notes (Signed)
will   SY[facility]NAGEMENT [facility]   HPI: Shawn Espinoza 74 y.o. male diagnosed with lung cancer.  Currently undergoing carboplatin/Alimta chemotherapy regimen.  Patient called the cancer Center today requesting urgent care visit.  He is complaining of increased dyspnea with any exertion, and occasional wheeze, and an occasional low-grade fever for the past few days.  He is also complaining of some dizziness when he changes position.  He denies any productive cough, GI symptoms, or UTI symptoms.  Patient did confirm that he is taking amlodipine and atenolol for his blood pressure on a regular basis.  He is scheduled to obtain a restaging CT on 01/10/2015.   HPI  CURRENT THERAPY: Upcoming Treatment Dates - LUNG Pemetrexed (Alimta) / Carboplatin q21d Days with orders from any treatment category:  01/10/2015      [facility] ONC SCHEDULING COMMUNICATION      CBC with Differential      Comprehensive metabolic panel      ondansetron (ZOFRAN) IVPB 16 mg      Dexamethasone Sodium Phosphate (DECADRON) injection 20 mg      CARBOplatin CHEMO intradermal Test Dose 100 mcg/0.82m      PEMEtrexed (ALIMTA) 1,025 mg in sodium chloride 0.9 % 100 mL chemo infusion      CARBOplatin (PARAPLATIN) in sodium chloride 0.9 % 100 mL chemo infusion      sodium chloride 0.9 % injection 10 mL      heparin lock flush 100 unit/mL      heparin lock flush 100 unit/mL      alteplase (CATHFLO ACTIVASE) injection 2 mg      sodium chloride 0.9 % injection 3 mL      0.9 %  sodium chloride infusion      TREATMENT CONDITIONS 01/31/2015      [facility] ONC SCHEDULING COMMUNICATION      CBC with Differential      Comprehensive metabolic panel      ondansetron (ZOFRAN) IVPB 16 mg      Dexamethasone Sodium Phosphate (DECADRON) injection 20 mg      CARBOplatin CHEMO intradermal Test Dose 100 mcg/0.059m     PEMEtrexed (ALIMTA) 1,025 mg in sodium chloride 0.9 % 100 mL chemo infusion      CARBOplatin (PARAPLATIN) in sodium chloride 0.9  % 100 mL chemo infusion      sodium chloride 0.9 % injection 10 mL      heparin lock flush 100 unit/mL      heparin lock flush 100 unit/mL      alteplase (CATHFLO ACTIVASE) injection 2 mg      sodium chloride 0.9 % injection 3 mL      0.9 %  sodium chloride infusion      TREATMENT CONDITIONS 02/21/2015      [facility] ONC SCHEDULING COMMUNICATION      CBC with Differential      Comprehensive metabolic panel      ondansetron (ZOFRAN) IVPB 16 mg      Dexamethasone Sodium Phosphate (DECADRON) injection 20 mg      CARBOplatin CHEMO intradermal Test Dose 100 mcg/0.0270m    PEMEtrexed (ALIMTA) 1,025 mg in sodium chloride 0.9 % 100 mL chemo infusion      CARBOplatin (PARAPLATIN) in sodium chloride 0.9 % 100 mL chemo infusion      sodium chloride 0.9 % injection 10 mL      heparin lock flush 100 unit/mL      heparin lock flush 100 unit/mL  alteplase (CATHFLO ACTIVASE) injection 2 mg      sodium chloride 0.9 % injection 3 mL      cyanocobalamin ((VITAMIN B-12)) injection 1,000 mcg      0.9 %  sodium chloride infusion      TREATMENT CONDITIONS    ROS  Past Medical History  Diagnosis Date  . History of diabetes mellitus, type II     resolved with diet, h/o neuropathy  . Diverticulosis of colon   . Hyperlipidemia   . Hypertension   . Fatty liver 02/29/00    abd ultrasound  . Lower back pain   . Systolic murmur 0867    2Decho - normal LV fxn, EF 55%, mild AS, biatrial enlargement  . History of tobacco use quit 1990s  . 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 Personal history of colonic adenomas 07/06/2013  . Hypertensive retinopathy of both eyes 2015    mild  . 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 Dysrhythmia     Atrial fib (found 07/2014)  . Shortness of breath   . Pneumonia   . GERD (gastroesophageal reflux disease)     occasional    . Arthritis   . Neuropathy   . Constipation   . 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 Colonoscopy  2004  . Colonoscopy  2014    tubular adenoma x1, mod diverticulosis (Gessner)  . Flexible bronchoscopy  02/2014    WNL  . Video bronchoscopy Bilateral 03/17/2014    Procedure: VIDEO BRONCHOSCOPY WITH FLUORO;  Surgeon: Tanda Rockers, MD;  Location: [facility];  Service: Cardiopulmonary;  Laterality: Bilateral;  . Chest tube insertion Left 08/26/2014    Procedure: INSERTION OF LEFT  PLEURAL DRAINAGE CATHETER;  Surgeon: Ivin Poot, MD;  Location: [place];  Service: Thoracic;  Laterality: Left;    has History of diabetes mellitus, type II; HYPERLIPIDEMIA; History of tobacco use; HYPERTENSION; HEMORRHOIDS; DIVERTICULOSIS, COLON; FATTY LIVER DISEASE; Medicare annual wellness visit, subsequent; Positive hepatitis C antibody test; Systolic murmur; Lower back pain; Polycythemia; BPH (benign prostatic hypertrophy); Right knee pain; Personal history of colonic adenoma; DOE (dyspnea on exertion); Adenocarcinoma of left lung, stage 4; Liver lesion; Recurrent left pleural effusion; History of atrial fibrillation; Cerumen debris on tympanic membrane; Skin rash; Bronchitis; Other pancytopenia; Hypoalbuminemia; Dehydration; Fatigue; Diarrhea; Interstitial pneumonitis; CHF (congestive heart failure); Abrasion of anterior right lower leg; Thrombocytopenia; Pedal edema; Protein-calorie malnutrition; Skin tear of right forearm without complication; Penile rash; Peripheral edema; and Orthostatic hypotension on his problem list.     is allergic to candesartan cilexetil; diltiazem hcl; doxazosin mesylate; nifedipine; pravastatin; red yeast rice; rosuvastatin; and simvastatin.    Medication List       This list is accurate as of: 01/04/15  2:46 PM.  Always use your most recent med list.               albuterol 108 (90 BASE)  MCG/ACT inhaler  Commonly known as:  PROVENTIL HFA;VENTOLIN HFA  Inhale 2 puffs into the lungs every 6 (six) hours as needed for wheezing or shortness of breath.     amLODipine 5 MG tablet  Commonly known as:  NORVASC  Take 1 tablet (5 mg total) by  mouth daily.     aspirin 81 MG tablet  Take 81 mg by mouth daily.     atenolol 25 MG tablet  Commonly known as:  TENORMIN  Take 1 tablet (25 mg total) by mouth 2 (two) times daily.     cetirizine 10 MG tablet  Commonly known as:  ZYRTEC  Take 10 mg by mouth daily as needed for allergies or rhinitis.     dexamethasone 4 MG tablet  Commonly known as:  DECADRON  4 mg by mouth twice a day the day before, day of and day after the chemotherapy every 3 weeks.     folic acid 1 MG tablet  Commonly known as:  FOLVITE  TAKE 1 TABLET BY MOUTH EVERY DAY     glimepiride 2 MG tablet  Commonly known as:  AMARYL  Take 1 tablet (2 mg total) by mouth daily with breakfast. Watch for low sugars     guaiFENesin 600 MG 12 hr tablet  Commonly known as:  MUCINEX  Take 600 mg by mouth 2 (two) times daily.     HYDROcodone-acetaminophen 5-325 MG per tablet  Commonly known as:  NORCO/VICODIN  Take 1 tablet by mouth every 6 (six) hours as needed for moderate pain.     ibuprofen 800 MG tablet  Commonly known as:  ADVIL,MOTRIN  Take 800 mg by mouth 2 (two) times daily as needed for moderate pain.     ICAPS Caps  Take by mouth 2 (two) times daily.     ketoconazole 2 % cream  Commonly known as:  NIZORAL  Apply 1 application topically 2 (two) times daily.     LASIX 20 MG tablet  Generic drug:  furosemide  Take 20 mg by mouth daily.     methocarbamol 500 MG tablet  Commonly known as:  ROBAXIN  Take 500 mg by mouth every 8 (eight) hours as needed (pain).     polyethylene glycol packet  Commonly known as:  MIRALAX / GLYCOLAX  Take 8 g by mouth daily.     sucralfate 1 GM/10ML suspension  Commonly known as:  CARAFATE  Take 1 g by mouth 4 (four)  times daily as needed (reflux).         PHYSICAL EXAMINATION  Blood pressure 124/69, pulse 76, temperature 98 F (36.7 C), temperature source Oral, resp. rate 20, height _0  (1.753 m), weight 199 lb 14.4 oz (90.674 kg), SpO2 99 %.  Physical Exam  Constitutional: He is oriented to person, place, and time and well-developed, well-nourished, and in no distress.  HENT:  Head: Normocephalic and atraumatic.  Mouth/Throat: Oropharynx is clear and moist.  Eyes: Conjunctivae and EOM are normal. Pupils are equal, round, and reactive to light. Right eye exhibits no discharge. Left eye exhibits no discharge. No scleral icterus.  Neck: Normal range of motion. Neck supple. No JVD present. No tracheal deviation present. No thyromegaly present.  Cardiovascular: Normal rate, regular rhythm, normal heart sounds and intact distal pulses.   Pulmonary/Chest: He has wheezes.  Patient with mild dyspnea with any type of exertion on exam.  No cough on exam.  Patient does have bilateral mild wheeze on exam.  Left Pleurx catheter intact.  Abdominal: Soft. Bowel sounds are normal. He exhibits no distension and no mass. There is no tenderness. There is no rebound and no guarding.  Musculoskeletal: Normal range of motion. He exhibits no edema or tenderness.  Lymphadenopathy:    He has no cervical adenopathy.  Neurological: He  is alert and oriented to person, place, and time. Gait normal.  Skin: Skin is warm and dry. No rash noted. No erythema.  Psychiatric: Affect normal.  Nursing note and vitals reviewed.   LABORATORY DATA:. Appointment on 01/04/2015  Component Date Value Ref Range Status  . Hold Tube, Blood Bank 01/04/2015 Blood Bank Order Cancelled   Final  . WBC 01/04/2015 1.9* 4.0 - 10.3 10e3/uL Final  . NEUT# 01/04/2015 1.2* 1.5 - 6.5 10e3/uL Final  . HGB 01/04/2015 8.8* 13.0 - 17.1 g/dL Final  . HCT 01/04/2015 27.2* 38.4 - 49.9 % Final  . Platelets 01/04/2015 91* 140 - 400 10e3/uL Final  . MCV  01/04/2015 94.8  79.3 - 98.0 fL Final  . MCH 01/04/2015 30.7  27.2 - 33.4 pg Final  . MCHC 01/04/2015 32.4  32.0 - 36.0 g/dL Final  . RBC 01/04/2015 2.87* 4.20 - 5.82 10e6/uL Final  . RDW 01/04/2015 16.5* 11.0 - 14.6 % Final  . lymph# 01/04/2015 0.5* 0.9 - 3.3 10e3/uL Final  . MONO# 01/04/2015 0.2  0.1 - 0.9 10e3/uL Final  . Eosinophils Absolute 01/04/2015 0.0  0.0 - 0.5 10e3/uL Final  . Basophils Absolute 01/04/2015 0.0  0.0 - 0.1 10e3/uL Final  . NEUT% 01/04/2015 62.0  39.0 - 75.0 % Final  . LYMPH% 01/04/2015 25.0  14.0 - 49.0 % Final  . MONO% 01/04/2015 10.9  0.0 - 14.0 % Final  . EOS% 01/04/2015 2.1  0.0 - 7.0 % Final  . BASO% 01/04/2015 0.0  0.0 - 2.0 % Final  . nRBC 01/04/2015 2* 0 - 0 % Final  . Sodium 01/04/2015 138  136 - 145 mEq/L Final  . Potassium 01/04/2015 4.3  3.5 - 5.1 mEq/L Final  . Chloride 01/04/2015 104  98 - 109 mEq/L Final  . CO2 01/04/2015 26  22 - 29 mEq/L Final  . Glucose 01/04/2015 215* 70 - 140 mg/dl Final  . BUN 01/04/2015 21.3  7.0 - 26.0 mg/dL Final  . Creatinine 01/04/2015 0.9  0.7 - 1.3 mg/dL Final  . Total Bilirubin 01/04/2015 0.72  0.20 - 1.20 mg/dL Final  . Alkaline Phosphatase 01/04/2015 62  40 - 150 U/L Final  . AST 01/04/2015 18  5 - 34 U/L Final  . ALT 01/04/2015 17  0 - 55 U/L Final  . Total Protein 01/04/2015 5.6* 6.4 - 8.3 g/dL Final  . Albumin 01/04/2015 2.9* 3.5 - 5.0 g/dL Final  . Calcium 01/04/2015 8.1* 8.4 - 10.4 mg/dL Final  . Anion Gap 01/04/2015 9  3 - 11 mEq/L Final  . EGFR 01/04/2015 81* >90 ml/min/1.73 m2 Final   eGFR is calculated using the CKD-EPI Creatinine Equation (2009)     RADIOGRAPHIC STUDIES: No results found.  ASSESSMENT/PLAN:    Adenocarcinoma of left lung, stage 4 Patient received cycle 3 of his carboplatin/Alimta chemotherapy regimen on 12/20/2014.  He is scheduled for restaging CT on 01/06/2015.  He will return to the [facility] for labs, follow-up visit, and his next cycle of chemotherapy on  01/10/2015.   Peripheral edema Patient continues to suffer with some chronic peripheral edema to his bilateral ankles.  Patient states that he alternates the Lasix from 20 mg per day up to 40 mg per day depending on the level of edema he is experiencing.    Recurrent left pleural effusion Patient has a left Pleurx for chronic left pleural effusion.  Pleurx is managed by Dr. Prescott Gum.  Patient is currently draining the Pleurx catheter 3  times per week.  He typically obtains between 408 100 mL of pleural fluid.  Patient is complaining of an occasional low-grade fever; and some increased dyspnea with exertion.  On exam-patient does appear mildly dyspneic with any exertion and has some bilateral mild wheezes.  Patient was encouraged to use his albuterol inhaler 1-2 puffs every 6 hours as needed while at home.  We'll also move up patients restaging CT to this week for further evaluation.  Patient was encouraged to call/return or go directly to the emergency department if he develops any worsening symptoms whatsoever.   Orthostatic hypotension Patient is complaining of dizziness with sudden position changes.  He states that he has made it difficult for him to ambulate around his home without holding onto walls and rails.  Lying blood pressure was 125/76, sitting blood pressure was 107/70, and standing blood pressure was 108/61.  Confirmed the patient is taking both amlodipine and atenolol for previous hypertension.  Advised patient to decrease both of his blood pressure medications to half of the regular dose.  Patient will decrease amlodipine to 2.5 mg and decrease the atenolol down to 12.5 mg.  Also advised patient that would call his primary care physician to inform of plan to decrease blood pressure medications.  Would appreciate further assistance in management of blood pressure issues per primary care physician if possible.  Advised patient to change positions slowly; and to make sure he is safe when  ambulating about his home.   Patient stated understanding of all instructions; and was in agreement with this plan of care. The patient knows to call the clinic with any problems, questions or concerns.   This was a shared visit with Dr. [name] today.  Total time spent with patient was 25 minutes;  with greater than 75 percent of that time spent in face to face counseling regarding his symptoms, and coordination of care and follow up.  Disclaimer: This note was dictated with voice recognition software. Similar sounding words can inadvertently be transcribed and may not be corrected upon review.   Drue Second, NP 01/04/2015   ADDENDUM: Hematology/Oncology Attending: I had a face to face encounter with the patient. I recommended his care plan. This is a very pleasant 74 years old white male with metastatic non-small cell lung cancer who is currently undergoing second course of systemic chemotherapy with carboplatin and Alimta status post 3 cycles. The patient is tolerating his treatment well except for the persistent shortness of breath and occasional dizzy spells. He has left Pleurx catheter placed and he drains around 700 mL 3 times a week. He feels better after drainage of the fluid but it doesn't last for long. He also continues to have cough with low-grade fever in the past few days. I saw the patient today and recommended for him to have repeat CT scan of the chest, abdomen and pelvis performed soon for evaluation of his condition and to rule out any disease progression or infectious process. His CT scan is scheduled to be done in 2 days. He will continue with the drainage of the left pleural effusion for now. I also advised the patient to continue on Lasix as previously recommended. He would come back for follow-up visit next week for reevaluation and discussion of his scan results and further recommendation regarding his condition. He was advised to call if he has any concerning symptoms  in the interval.  Disclaimer: This note was dictated with voice recognition software. Similar sounding words can inadvertently be  transcribed and may be missed upon review. Eilleen Kempf., MD 01/04/2015

## 2015-01-04 NOTE — Telephone Encounter (Signed)
Message left for patient to return my call.  

## 2015-01-04 NOTE — Progress Notes (Signed)
Orthostatic vs done    Lying  BP 125/76  HR 77 Sitting BP 107/70 HR 73 Standing BP 108/61 HR 84

## 2015-01-04 NOTE — Telephone Encounter (Signed)
Gave avs & cal for Jan.

## 2015-01-04 NOTE — Assessment & Plan Note (Signed)
Patient continues to suffer with some chronic peripheral edema to his bilateral ankles.  Patient states that he alternates the Lasix from 20 mg per day up to 40 mg per day depending on the level of edema he is experiencing.

## 2015-01-04 NOTE — Telephone Encounter (Signed)
, °

## 2015-01-04 NOTE — Assessment & Plan Note (Signed)
Patient received cycle 3 of his carboplatin/Alimta chemotherapy regimen on 12/20/2014.  He is scheduled for restaging CT on 01/06/2015.  He will return to the [facility] for labs, follow-up visit, and his next cycle of chemotherapy on 01/10/2015.

## 2015-01-04 NOTE — Telephone Encounter (Signed)
plz notify patient I reviewed onc note - agree with decreasing blood pressure meds. If persistent dizziness despite decrease, I would recommend stopping atenolol completely. Ok to continue amlodipine 2.5mg  daily. To update [place] with effect in 1 week.

## 2015-01-04 NOTE — Assessment & Plan Note (Signed)
Patient is complaining of dizziness with sudden position changes.  He states that he has made it difficult for him to ambulate around his home without holding onto walls and rails.  Lying blood pressure was 125/76, sitting blood pressure was 107/70, and standing blood pressure was 108/61.  Confirmed the patient is taking both amlodipine and atenolol for previous hypertension.  Advised patient to decrease both of his blood pressure medications to half of the regular dose.  Patient will decrease amlodipine to 2.5 mg and decrease the atenolol down to 12.5 mg.  Also advised patient that would call his primary care physician to inform of plan to decrease blood pressure medications.  Would appreciate further assistance in management of blood pressure issues per primary care physician if possible.  Advised patient to change positions slowly; and to make sure he is safe when ambulating about his home.

## 2015-01-04 NOTE — Assessment & Plan Note (Signed)
Patient has a left Pleurx for chronic left pleural effusion.  Pleurx is managed by Dr. Prescott Gum.  Patient is currently draining the Pleurx catheter 3 times per week.  He typically obtains between 408 100 mL of pleural fluid.  Patient is complaining of an occasional low-grade fever; and some increased dyspnea with exertion.  On exam-patient does appear mildly dyspneic with any exertion and has some bilateral mild wheezes.  Patient was encouraged to use his albuterol inhaler 1-2 puffs every 6 hours as needed while at home.  We'll also move up patients restaging CT to this week for further evaluation.  Patient was encouraged to call/return or go directly to the emergency department if he develops any worsening symptoms whatsoever.

## 2015-01-05 ENCOUNTER — Telehealth: Payer: Self-pay | Admitting: *Deleted

## 2015-01-05 NOTE — Telephone Encounter (Signed)
error 

## 2015-01-05 NOTE — Telephone Encounter (Signed)
Spoke with patient's wife and notified her and she verbalized understanding. She was also asking for clarification on his glimepiride and lasix dosing because she thinks he may be taking it incorrectly. I advised that he should be taking 2mg  of glimepiride daily. She confirmed that he has only been taking 0.5mg  daily. She remembered there being a change, but didn't write it down, so she decreased it instead of increasing it. He had 1mg  tablets leftover at home, so they have been breaking them in half. I told her to have him either take 2 of what he has at home or pick up the 2mg  tablet that is at the pharmacy and take 1. I confirmed that he was to be taking 10mg  of lasix daily and she thinks he is still taking 20mg . I told her that he called stating he was urinating too frequently and asked if he could decrease it and Dr. Darnell Level agreed that he could. So, he should be on 10mg . She wrote everything down and read it back to me. She will make sure that everything is correct at home and call back if any problems.

## 2015-01-05 NOTE — Telephone Encounter (Signed)
Spoke with wife-he is less dizzy now, but does still get a little dizzy when walking. Walks a few feet, and then will hold the counter or touch the wall. She says she is not afraid of him falling any longer. Dr. Danise Mina is aware of visit and changes to meds and agrees with reduction in amlodipine and atenolol. Told wife to call him in a week with update and if still dizzy he will stop his atenolol completely. Confirmed he is eating and drinking well. Confirmed his CT appointment on 1/15 and and his follow up here on 1/19.

## 2015-01-06 ENCOUNTER — Ambulatory Visit (HOSPITAL_COMMUNITY)
Admission: RE | Admit: 2015-01-06 | Discharge: 2015-01-06 | Disposition: A | Discharge status: Home / Self Care | Payer: Medicare Other | Source: Ambulatory Visit | Attending: Internal Medicine | Admitting: Internal Medicine

## 2015-01-06 ENCOUNTER — Encounter (HOSPITAL_COMMUNITY): Payer: Self-pay

## 2015-01-06 ENCOUNTER — Ambulatory Visit: Payer: Medicare Other

## 2015-01-06 DIAGNOSIS — C787 Secondary malignant neoplasm of liver and intrahepatic bile duct: Secondary | ICD-10-CM | POA: Diagnosis not present

## 2015-01-06 DIAGNOSIS — J90 Pleural effusion, not elsewhere classified: Secondary | ICD-10-CM | POA: Diagnosis not present

## 2015-01-06 DIAGNOSIS — Z79899 Other long term (current) drug therapy: Secondary | ICD-10-CM | POA: Diagnosis not present

## 2015-01-06 DIAGNOSIS — C3492 Malignant neoplasm of unspecified part of left bronchus or lung: Secondary | ICD-10-CM | POA: Insufficient documentation

## 2015-01-06 DIAGNOSIS — C3490 Malignant neoplasm of unspecified part of unspecified bronchus or lung: Secondary | ICD-10-CM | POA: Diagnosis not present

## 2015-01-06 MED ORDER — IOHEXOL 300 MG/ML  SOLN
100.0000 mL | Freq: Once | INTRAMUSCULAR | Status: AC | PRN
  Administered 2015-01-06: 100 mL via INTRAVENOUS

## 2015-01-06 NOTE — Progress Notes (Signed)
Pt came in for IV start prior to CT. IV placed R forearm. Tolerated without difficulty. Radiology made aware to DC IV after scan.

## 2015-01-09 ENCOUNTER — Other Ambulatory Visit: Payer: Self-pay | Admitting: Cardiothoracic Surgery

## 2015-01-09 DIAGNOSIS — C3492 Malignant neoplasm of unspecified part of left bronchus or lung: Secondary | ICD-10-CM

## 2015-01-10 ENCOUNTER — Other Ambulatory Visit: Payer: Medicare Other

## 2015-01-10 ENCOUNTER — Ambulatory Visit (HOSPITAL_COMMUNITY): Payer: Medicare Other

## 2015-01-10 ENCOUNTER — Other Ambulatory Visit (HOSPITAL_BASED_OUTPATIENT_CLINIC_OR_DEPARTMENT_OTHER): Payer: Medicare Other

## 2015-01-10 ENCOUNTER — Encounter: Payer: Self-pay | Admitting: Nurse Practitioner

## 2015-01-10 ENCOUNTER — Ambulatory Visit (HOSPITAL_BASED_OUTPATIENT_CLINIC_OR_DEPARTMENT_OTHER): Payer: Medicare Other

## 2015-01-10 ENCOUNTER — Ambulatory Visit (HOSPITAL_BASED_OUTPATIENT_CLINIC_OR_DEPARTMENT_OTHER): Payer: Medicare Other | Admitting: Nurse Practitioner

## 2015-01-10 ENCOUNTER — Telehealth: Payer: Self-pay | Admitting: Nurse Practitioner

## 2015-01-10 VITALS — BP 135/80 | HR 70 | Temp 97.5°F | Resp 18 | Ht 69.0 in | Wt 203.8 lb

## 2015-01-10 DIAGNOSIS — Z8639 Personal history of other endocrine, nutritional and metabolic disease: Secondary | ICD-10-CM

## 2015-01-10 DIAGNOSIS — E119 Type 2 diabetes mellitus without complications: Secondary | ICD-10-CM | POA: Diagnosis not present

## 2015-01-10 DIAGNOSIS — R609 Edema, unspecified: Secondary | ICD-10-CM

## 2015-01-10 DIAGNOSIS — R530 Neoplastic (malignant) related fatigue: Secondary | ICD-10-CM | POA: Insufficient documentation

## 2015-01-10 DIAGNOSIS — Z5111 Encounter for antineoplastic chemotherapy: Secondary | ICD-10-CM | POA: Diagnosis not present

## 2015-01-10 DIAGNOSIS — C3492 Malignant neoplasm of unspecified part of left bronchus or lung: Secondary | ICD-10-CM

## 2015-01-10 DIAGNOSIS — C3412 Malignant neoplasm of upper lobe, left bronchus or lung: Secondary | ICD-10-CM

## 2015-01-10 DIAGNOSIS — J90 Pleural effusion, not elsewhere classified: Secondary | ICD-10-CM | POA: Diagnosis not present

## 2015-01-10 DIAGNOSIS — I951 Orthostatic hypotension: Secondary | ICD-10-CM | POA: Diagnosis not present

## 2015-01-10 LAB — COMPREHENSIVE METABOLIC PANEL (CC13)
ALT: 18 U/L (ref 0–55)
AST: 17 U/L (ref 5–34)
Albumin: 3.2 g/dL — ABNORMAL LOW (ref 3.5–5.0)
Alkaline Phosphatase: 69 U/L (ref 40–150)
Anion Gap: 10 mEq/L (ref 3–11)
BUN: 20.4 mg/dL (ref 7.0–26.0)
CO2: 24 mEq/L (ref 22–29)
Calcium: 8.5 mg/dL (ref 8.4–10.4)
Chloride: 104 mEq/L (ref 98–109)
Creatinine: 1 mg/dL (ref 0.7–1.3)
EGFR: 71 mL/min/{1.73_m2} — ABNORMAL LOW (ref >90)
Glucose: 325 mg/dl — ABNORMAL HIGH (ref 70–140)
Potassium: 4.3 mEq/L (ref 3.5–5.1)
Sodium: 137 mEq/L (ref 136–145)
Total Bilirubin: 0.54 mg/dL (ref 0.20–1.20)
Total Protein: 5.8 g/dL — ABNORMAL LOW (ref 6.4–8.3)

## 2015-01-10 LAB — CBC WITH DIFFERENTIAL/PLATELET
BASO%: 0 % (ref 0.0–2.0)
BASOS ABS: 0 10*3/uL (ref 0.0–0.1)
EOS%: 0 % (ref 0.0–7.0)
Eosinophils Absolute: 0 10*3/uL (ref 0.0–0.5)
HCT: 31.1 % — ABNORMAL LOW (ref 38.4–49.9)
HEMOGLOBIN: 10.1 g/dL — AB (ref 13.0–17.1)
LYMPH%: 8.5 % — AB (ref 14.0–49.0)
MCH: 31.3 pg (ref 27.2–33.4)
MCHC: 32.5 g/dL (ref 32.0–36.0)
MCV: 96.3 fL (ref 79.3–98.0)
MONO#: 0.4 10*3/uL (ref 0.1–0.9)
MONO%: 9.5 % (ref 0.0–14.0)
NEUT%: 82 % — AB (ref 39.0–75.0)
NEUTROS ABS: 3.1 10*3/uL (ref 1.5–6.5)
PLATELETS: 205 10*3/uL (ref 140–400)
RBC: 3.23 10*6/uL — ABNORMAL LOW (ref 4.20–5.82)
RDW: 18.6 % — ABNORMAL HIGH (ref 11.0–14.6)
WBC: 3.8 10*3/uL — ABNORMAL LOW (ref 4.0–10.3)
lymph#: 0.3 10*3/uL — ABNORMAL LOW (ref 0.9–3.3)
nRBC: 2 % — ABNORMAL HIGH (ref 0–0)

## 2015-01-10 MED ORDER — SODIUM CHLORIDE 0.9 % IV SOLN
1000.0000 mg | Freq: Once | INTRAVENOUS | Status: AC
  Administered 2015-01-10: 1000 mg via INTRAVENOUS
  Filled 2015-01-10: qty 40

## 2015-01-10 MED ORDER — HEPARIN SOD (PORK) LOCK FLUSH 100 UNIT/ML IV SOLN
500.0000 [IU] | Freq: Once | INTRAVENOUS | Status: DC | PRN
  Filled 2015-01-10: qty 5

## 2015-01-10 MED ORDER — DEXAMETHASONE SODIUM PHOSPHATE 20 MG/5ML IJ SOLN
INTRAMUSCULAR | Status: AC
  Filled 2015-01-10: qty 5

## 2015-01-10 MED ORDER — ONDANSETRON 16 MG/50ML IVPB (CHCC)
16.0000 mg | Freq: Once | INTRAVENOUS | Status: AC
  Administered 2015-01-10: 16 mg via INTRAVENOUS

## 2015-01-10 MED ORDER — CARBOPLATIN CHEMO INTRADERMAL TEST DOSE 100MCG/0.02ML
100.0000 ug | Freq: Once | INTRADERMAL | Status: AC
  Administered 2015-01-10: 100 ug via INTRADERMAL
  Filled 2015-01-10: qty 0.01

## 2015-01-10 MED ORDER — ONDANSETRON 16 MG/50ML IVPB (CHCC)
INTRAVENOUS | Status: AC
  Filled 2015-01-10: qty 16

## 2015-01-10 MED ORDER — DEXAMETHASONE SODIUM PHOSPHATE 20 MG/5ML IJ SOLN
20.0000 mg | Freq: Once | INTRAMUSCULAR | Status: AC
  Administered 2015-01-10: 20 mg via INTRAVENOUS

## 2015-01-10 MED ORDER — SODIUM CHLORIDE 0.9 % IV SOLN
510.0000 mg | Freq: Once | INTRAVENOUS | Status: AC
  Administered 2015-01-10: 510 mg via INTRAVENOUS
  Filled 2015-01-10: qty 51

## 2015-01-10 MED ORDER — SODIUM CHLORIDE 0.9 % IJ SOLN
10.0000 mL | INTRAMUSCULAR | Status: DC | PRN
  Filled 2015-01-10: qty 10

## 2015-01-10 MED ORDER — SODIUM CHLORIDE 0.9 % IV SOLN
Freq: Once | INTRAVENOUS | Status: AC
  Administered 2015-01-10: 12:00:00 via INTRAVENOUS

## 2015-01-10 NOTE — Assessment & Plan Note (Signed)
Patient received cycle 3 of his carboplatin/Alimta chemotherapy regimen on 12/20/2014.  Patient states his restaging scan obtained last week revealed stable disease.  Blood counts today are stable; and patient will proceed today with cycle 4 of his carboplatin/Alimta chemotherapy regimen.  Patient has plans to return in approximately 3 weeks for cycle 5 of the same regimen.

## 2015-01-10 NOTE — Assessment & Plan Note (Signed)
Patient continues to suffer with some chronic peripheral edema to his bilateral ankles.  Patient states that he is now taking Lasix 40 mg daily; and his edema does appear stabilized.

## 2015-01-10 NOTE — Telephone Encounter (Signed)
Gave avs & calendar for February. Sent mess to adjust tx.

## 2015-01-10 NOTE — Assessment & Plan Note (Signed)
Patient has a left Pleurx for chronic left pleural effusion.  Pleurx is managed by Dr. Prescott Gum.  Patient is currently draining the Pleurx catheter 3 times per week.  He typically obtains between 400-800 mL of pleural fluid.  Patient with much less wheezing and dyspnea on exam today.  Patient is attributing his improved respiratory status to taking dexamethasone prior to his chemotherapy today.

## 2015-01-10 NOTE — Assessment & Plan Note (Signed)
Patient is complaining of some progressive fatigue.  Most likely, this is secondary to chemotherapy and chronic COPD and dyspnea.  Patient admits to minimal activity within the house; and states that he has been too fatigued to go outside.  Encouraged patient to try to increase his activity level; walk outside for at least a few minutes every day.  Hopefully, this will increase patient's energy level.  Also, patient and his wife are both asking if he could take dexamethasone 4 mg daily on intermittent basis for fatigue issues; as well as dyspnea.  Patient was given permission to try the dexamethasone 4 mg tablet daily only when needed.  Recommended that he not stay on dexamethasone on a long-term daily basis.

## 2015-01-10 NOTE — Progress Notes (Signed)
SYMPTOM MANAGEMENT CLINIC   HPI: Shawn Espinoza 74 y.o. male diagnosed with lung cancer; currently undergoing both carboplatin and Alimta chemotherapy regimen.  Patient presents to the [facility] today to receive cycle 4 of his chemotherapy.  He underwent a restaging CT just last week.  He continues to drain pleural fluid via his Pleurx catheter 3 times per week.  He reports having less squeeze and dyspnea for the past several days.  He is attributing the improved respiratory status to taking dexamethasone prior to his chemotherapy today.  Both patient and his wife are asking if patient could possibly take dexamethasone once daily on a PRN basis for both dyspnea and fatigue issues.  Patient admits to being less active around the house recently due to increased fatigue; and states that he rarely goes outside for walks anymore.  He denies any recent fevers or chills.  Patient also reports that his lower extremity peripheral edema is stable; stating that he is taking the Lasix 40 mg on a daily basis now.  He reports that his orthostatic hypotension and associated dizziness has greatly improved as well since cutting both his amlodipine and atenolol in half.   HPI  CURRENT THERAPY: Upcoming Treatment Dates - LUNG Pemetrexed (Alimta) / Carboplatin q21d Days with orders from any treatment category:  01/31/2015      SCHEDULING COMMUNICATION      CBC with Differential      Comprehensive metabolic panel      CARBOplatin CHEMO intradermal Test Dose 100 mcg/0.38m      ondansetron (ZOFRAN) IVPB 16 mg      Dexamethasone Sodium Phosphate (DECADRON) injection 20 mg      PEMEtrexed (ALIMTA) 1,025 mg in sodium chloride 0.9 % 100 mL chemo infusion      CARBOplatin (PARAPLATIN) in sodium chloride 0.9 % 100 mL chemo infusion      sodium chloride 0.9 % injection 10 mL      heparin lock flush 100 unit/mL      heparin lock flush 100 unit/mL      alteplase (CATHFLO ACTIVASE) injection 2 mg      sodium  chloride 0.9 % injection 3 mL      0.9 %  sodium chloride infusion      TREATMENT CONDITIONS 02/21/2015      [facility] ON[facility] SCHEDULING COMMUNICATION      CBC with Differential      Comprehensive metabolic panel      ondansetron (ZOFRAN) IVPB 16 mg      Dexamethasone Sodium Phosphate (DECADRON) injection 20 mg      CARBOplatin CHEMO intradermal Test Dose 100 mcg/0.053m     PEMEtrexed (ALIMTA) 1,025 mg in sodium chloride 0.9 % 100 mL chemo infusion      CARBOplatin (PARAPLATIN) in sodium chloride 0.9 % 100 mL chemo infusion      sodium chloride 0.9 % injection 10 mL      heparin lock flush 100 unit/mL      heparin lock flush 100 unit/mL      alteplase (CATHFLO ACTIVASE) injection 2 mg      sodium chloride 0.9 % injection 3 mL      cyanocobalamin ((VITAMIN B-12)) injection 1,000 mcg      0.9 %  sodium chloride infusion      TREATMENT CONDITIONS    ROS  Past Medical History  Diagnosis Date  . History of diabetes mellitus, type II     resolved with diet, h/o neuropathy  .  Diverticulosis of colon   . Hyperlipidemia   . Hypertension   . Fatty liver 02/29/00    abd ultrasound  . Lower back pain   . Systolic murmur 1610    2Decho - normal LV fxn, EF 55%, mild AS, biatrial enlargement  . History of tobacco use quit 1990s  . 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 Personal history of colonic adenomas 07/06/2013  . Hypertensive retinopathy of both eyes 2015    mild  . 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 Dysrhythmia     Atrial fib (found 07/2014)  . Shortness of breath   . Pneumonia   . GERD (gastroesophageal reflux disease)     occasional  . Arthritis   . Neuropathy   . Constipation   . 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 Diabetes mellitus without complication     Past Surgical History  Procedure  Laterality Date  . Colonoscopy  2004  . Colonoscopy  2014    tubular adenoma x1, mod diverticulosis ([facility])  . Flexible bronchoscopy  02/2014    WNL  . Video bronchoscopy Bilateral 03/17/2014    Procedure: VIDEO BRONCHOSCOPY WITH FLUORO;  Surgeon: Tanda Rockers, MD;  Location: [facility];  Service: Cardiopulmonary;  Laterality: Bilateral;  . Chest tube insertion Left 08/26/2014    Procedure: INSERTION OF LEFT  PLEURAL DRAINAGE CATHETER;  Surgeon: Ivin Poot, MD;  Location: [facility];  Service: Thoracic;  Laterality: Left;    has History of diabetes mellitus, type II; HYPERLIPIDEMIA; History of tobacco use; HYPERTENSION; HEMORRHOIDS; DIVERTICULOSIS, COLON; FATTY LIVER DISEASE; Medicare annual wellness visit, subsequent; Positive hepatitis C antibody test; Systolic murmur; Lower back pain; Polycythemia; BPH (benign prostatic hypertrophy); Right knee pain; Personal history of colonic adenoma; DOE (dyspnea on exertion); Adenocarcinoma of left lung, stage 4; Liver lesion; Recurrent left pleural effusion; History of atrial fibrillation; Cerumen debris on tympanic membrane; Skin rash; Bronchitis; Other pancytopenia; Hypoalbuminemia; Dehydration; Fatigue; Diarrhea; Interstitial pneumonitis; CHF (congestive heart failure); Abrasion of anterior right lower leg; Thrombocytopenia; Pedal edema; Protein-calorie malnutrition; Skin tear of right forearm without complication; Penile rash; Peripheral edema; Orthostatic hypotension; and Neoplastic malignant related fatigue on his problem list.    is allergic to candesartan cilexetil; diltiazem hcl; doxazosin mesylate; nifedipine; pravastatin; red yeast rice; rosuvastatin; and simvastatin.    Medication List       This list is accurate as of: 01/10/15 11:57 AM.  Always use your most recent med list.               albuterol 108 (90 BASE) MCG/ACT inhaler  Commonly known as:  PROVENTIL HFA;VENTOLIN HFA  Inhale 2 puffs into the lungs every 6 (six) hours as  needed for wheezing or shortness of breath.     amLODipine 5 MG tablet  Commonly known as:  NORVASC  Take 1 tablet (5 mg total) by mouth daily.     aspirin 81 MG tablet  Take 81 mg by mouth daily.     atenolol 25 MG tablet  Commonly known as:  TENORMIN  Take 1 tablet (25 mg total) by mouth 2 (two) times daily.     cetirizine 10 MG tablet  Commonly known as:  ZYRTEC  Take 10 mg by mouth daily as needed for allergies  or rhinitis.     dexamethasone 4 MG tablet  Commonly known as:  DECADRON  4 mg by mouth twice a day the day before, day of and day after the chemotherapy every 3 weeks.     folic acid 1 MG tablet  Commonly known as:  FOLVITE  TAKE 1 TABLET BY MOUTH EVERY DAY     glimepiride 2 MG tablet  Commonly known as:  AMARYL  Take 1 tablet (2 mg total) by mouth daily with breakfast. Watch for low sugars     guaiFENesin 600 MG 12 hr tablet  Commonly known as:  MUCINEX  Take 600 mg by mouth 2 (two) times daily.     HYDROcodone-acetaminophen 5-325 MG per tablet  Commonly known as:  NORCO/VICODIN  Take 1 tablet by mouth every 6 (six) hours as needed for moderate pain.     ibuprofen 800 MG tablet  Commonly known as:  ADVIL,MOTRIN  Take 800 mg by mouth 2 (two) times daily as needed for moderate pain.     ICAPS Caps  Take by mouth 2 (two) times daily.     ketoconazole 2 % cream  Commonly known as:  NIZORAL  Apply 1 application topically 2 (two) times daily.     LASIX 20 MG tablet  Generic drug:  furosemide  Take 20 mg by mouth daily.     methocarbamol 500 MG tablet  Commonly known as:  ROBAXIN  Take 500 mg by mouth every 8 (eight) hours as needed (pain).     polyethylene glycol packet  Commonly known as:  MIRALAX / GLYCOLAX  Take 8 g by mouth daily.     sucralfate 1 GM/10ML suspension  Commonly known as:  CARAFATE  Take 1 g by mouth 4 (four) times daily as needed (reflux).         PHYSICAL EXAMINATION  Blood pressure 135/80, pulse 70, temperature 97.5 F  (36.4 C), temperature source Oral, resp. rate 18, height 5' 9"  (1.753 m), weight 203 lb 12.8 oz (92.443 kg), SpO2 100 %.  Physical Exam  Constitutional: He is oriented to person, place, and time and well-developed, well-nourished, and in no distress.  HENT:  Head: Normocephalic and atraumatic.  Mouth/Throat: Oropharynx is clear and moist.  Eyes: Conjunctivae and EOM are normal. Pupils are equal, round, and reactive to light. Right eye exhibits no discharge. Left eye exhibits no discharge. No scleral icterus.  Neck: Normal range of motion. Neck supple. No JVD present. No tracheal deviation present. No thyromegaly present.  Cardiovascular: Normal rate, regular rhythm, normal heart sounds and intact distal pulses.   Pulmonary/Chest: Effort normal. No respiratory distress. He has wheezes. He has no rales. He exhibits no tenderness.  Slight wheezes in bases bilaterally; but no obvious dyspnea or acute respiratory distress on exam.  Left Pleurx catheter intact.  Abdominal: Soft. Bowel sounds are normal. He exhibits no distension and no mass. There is no tenderness. There is no rebound and no guarding.  Musculoskeletal: Normal range of motion. He exhibits edema. He exhibits no tenderness.  +2 edema to bilateral lower extremities.  Lymphadenopathy:    He has no cervical adenopathy.  Neurological: He is alert and oriented to person, place, and time. Gait normal.  Skin: Skin is warm and dry. No rash noted. No erythema.  Psychiatric: Affect normal.  Nursing note and vitals reviewed.   LABORATORY DATA:. Appointment on 01/10/2015  Component Date Value Ref Range Status  . WBC 01/10/2015 3.8* 4.0 - 10.3 10e3/uL Final  .  NEUT# 01/10/2015 3.1  1.5 - 6.5 10e3/uL Final  . HGB 01/10/2015 10.1* 13.0 - 17.1 g/dL Final  . HCT 01/10/2015 31.1* 38.4 - 49.9 % Final  . Platelets 01/10/2015 205  140 - 400 10e3/uL Final  . MCV 01/10/2015 96.3  79.3 - 98.0 fL Final  . MCH 01/10/2015 31.3  27.2 - 33.4 pg Final  .  MCHC 01/10/2015 32.5  32.0 - 36.0 g/dL Final  . RBC 01/10/2015 3.23* 4.20 - 5.82 10e6/uL Final  . RDW 01/10/2015 18.6* 11.0 - 14.6 % Final  . lymph# 01/10/2015 0.3* 0.9 - 3.3 10e3/uL Final  . MONO# 01/10/2015 0.4  0.1 - 0.9 10e3/uL Final  . Eosinophils Absolute 01/10/2015 0.0  0.0 - 0.5 10e3/uL Final  . Basophils Absolute 01/10/2015 0.0  0.0 - 0.1 10e3/uL Final  . NEUT% 01/10/2015 82.0* 39.0 - 75.0 % Final  . LYMPH% 01/10/2015 8.5* 14.0 - 49.0 % Final  . MONO% 01/10/2015 9.5  0.0 - 14.0 % Final  . EOS% 01/10/2015 0.0  0.0 - 7.0 % Final  . BASO% 01/10/2015 0.0  0.0 - 2.0 % Final  . nRBC 01/10/2015 2* 0 - 0 % Final  . Sodium 01/10/2015 137  136 - 145 mEq/L Final  . Potassium 01/10/2015 4.3  3.5 - 5.1 mEq/L Final  . Chloride 01/10/2015 104  98 - 109 mEq/L Final  . CO2 01/10/2015 24  22 - 29 mEq/L Final  . Glucose 01/10/2015 325* 70 - 140 mg/dl Final  . BUN 01/10/2015 20.4  7.0 - 26.0 mg/dL Final  . Creatinine 01/10/2015 1.0  0.7 - 1.3 mg/dL Final  . Total Bilirubin 01/10/2015 0.54  0.20 - 1.20 mg/dL Final  . Alkaline Phosphatase 01/10/2015 69  40 - 150 U/L Final  . AST 01/10/2015 17  5 - 34 U/L Final  . ALT 01/10/2015 18  0 - 55 U/L Final  . Total Protein 01/10/2015 5.8* 6.4 - 8.3 g/dL Final  . Albumin 01/10/2015 3.2* 3.5 - 5.0 g/dL Final  . Calcium 01/10/2015 8.5  8.4 - 10.4 mg/dL Final  . Anion Gap 01/10/2015 10  3 - 11 mEq/L Final  . EGFR 01/10/2015 71* >90 ml/min/1.73 m2 Final   eGFR is calculated using the CKD-EPI Creatinine Equation (2009)     RADIOGRAPHIC STUDIES: No results found.  ASSESSMENT/PLAN:    Adenocarcinoma of left lung, stage 4 Patient received cycle 3 of his carboplatin/Alimta chemotherapy regimen on 12/20/2014.  Patient states his restaging scan obtained last week revealed stable disease.  Blood counts today are stable; and patient will proceed today with cycle 4 of his carboplatin/Alimta chemotherapy regimen.  Patient has plans to return in approximately 3  weeks for cycle 5 of the same regimen.    History of diabetes mellitus, type II Patient has history of chronic diabetes.  Patient has been intermittently taking either full dose or half dose of his glimepiride.  Blood sugar today was elevated to 325.  Most likely, this is secondary to taking dexamethasone in anticipation of his chemotherapy.  Patient has been advised that he may take dexamethasone 4 mg on a daily basis only when needed for his chronic dyspnea and worsening fatigue.  Advised both patient and his wife that taking dexamethasone will most likely continue to increase his blood sugars.  Advised patient to take the glimepiride as directed in the future.  Also, patient has plans to meet with his primary care physician in approximately 2 weeks; and has plans to discuss/review his diabetic  medications and management.   Neoplastic malignant related fatigue Patient is complaining of some progressive fatigue.  Most likely, this is secondary to chemotherapy and chronic COPD and dyspnea.  Patient admits to minimal activity within the house; and states that he has been too fatigued to go outside.  Encouraged patient to try to increase his activity level; walk outside for at least a few minutes every day.  Hopefully, this will increase patient's energy level.  Also, patient and his wife are both asking if he could take dexamethasone 4 mg daily on intermittent basis for fatigue issues; as well as dyspnea.  Patient was given permission to try the dexamethasone 4 mg tablet daily only when needed.  Recommended that he not stay on dexamethasone on a long-term daily basis.   Orthostatic hypotension Patient was found to have orthostatic hypotension during his last exam.  He was complaining of increased dizziness with any sudden position change.  Patient has decreased both his amlodipine and atenolol to half doses; and states that his dizziness has greatly improved.  Blood pressure today was  135/80.       Peripheral edema Patient continues to suffer with some chronic peripheral edema to his bilateral ankles.  Patient states that he is now taking Lasix 40 mg daily; and his edema does appear stabilized.     Recurrent left pleural effusion Patient has a left Pleurx for chronic left pleural effusion.  Pleurx is managed by Dr. Prescott Gum.  Patient is currently draining the Pleurx catheter 3 times per week.  He typically obtains between 400-800 mL of pleural fluid.  Patient with much less wheezing and dyspnea on exam today.  Patient is attributing his improved respiratory status to taking dexamethasone prior to his chemotherapy today.     Patient stated understanding of all instructions; and was in agreement with this plan of care. The patient knows to call the clinic with any problems, questions or concerns.   This was a shared visit with Dr. [name] today.  Total time spent with patient was 25 minutes;  with greater than 75 percent of that time spent in face to face counseling regarding his symptoms and coordination of care and follow up.  Disclaimer: This note was dictated with voice recognition software. Similar sounding words can inadvertently be transcribed and may not be corrected upon review.   Drue Second, NP 01/10/2015   ADDENDUM:  Hematology/Oncology Attending:  I had a face to face encounter with the patient today. I recommended his care plan. This is a very pleasant 74 years old white male with metastatic non-small cell lung cancer, adenocarcinoma currently on treatment with carboplatin and Alimta status post 3 cycles. The recent CT scan of the chest, abdomen and pelvis showed stable disease with no evidence for disease progression in the liver or lung except for the increased size of the small left pleural effusion. His breathing has significantly improved over the last few days.  I had a lengthy discussion with the patient and his wife today about his scan  results and treatment options. I recommended for the patient to proceed with 3 more cycles of systemic chemotherapy with the same regimen carboplatin for AUC of 5 and Alimta 500 MG/M2 face 3 weeks. The patient agreed to the current plan. He would come back for follow-up visit in 3 weeks with the next cycle of his treatment. He was advised to call immediately if he has any concerning symptoms in the interval.  Disclaimer: This note was dictated  with voice recognition software. Similar sounding words can inadvertently be transcribed and may be missed upon review. Eilleen Kempf., MD 01/10/2015

## 2015-01-10 NOTE — Assessment & Plan Note (Signed)
Patient was found to have orthostatic hypotension during his last exam.  He was complaining of increased dizziness with any sudden position change.  Patient has decreased both his amlodipine and atenolol to half doses; and states that his dizziness has greatly improved.  Blood pressure today was 135/80.

## 2015-01-10 NOTE — Patient Instructions (Signed)
[facility] Discharge Instructions for Patients Receiving Chemotherapy  Today you received the following chemotherapy agents Alimta, Carboplatin  To help prevent nausea and vomiting after your treatment, we encourage you to take your nausea medication as directed.   If you develop nausea and vomiting that is not controlled by your nausea medication, call the clinic.   BELOW ARE SYMPTOMS THAT SHOULD BE REPORTED IMMEDIATELY:  *FEVER GREATER THAN 100.5 F  *CHILLS WITH OR WITHOUT FEVER  NAUSEA AND VOMITING THAT IS NOT CONTROLLED WITH YOUR NAUSEA MEDICATION  *UNUSUAL SHORTNESS OF BREATH  *UNUSUAL BRUISING OR BLEEDING  TENDERNESS IN MOUTH AND THROAT WITH OR WITHOUT PRESENCE OF ULCERS  *URINARY PROBLEMS  *BOWEL PROBLEMS  UNUSUAL RASH Items with * indicate a potential emergency and should be followed up as soon as possible.  Feel free to call the clinic you have any questions or concerns. The clinic phone number is [phone].

## 2015-01-10 NOTE — Assessment & Plan Note (Addendum)
Patient has history of chronic diabetes.  Patient has been intermittently taking either full dose or half dose of his glimepiride.  Blood sugar today was elevated to 325.  Most likely, this is secondary to taking dexamethasone in anticipation of his chemotherapy.  Patient has been advised that he may take dexamethasone 4 mg on a daily basis only when needed for his chronic dyspnea and worsening fatigue.  Advised both patient and his wife that taking dexamethasone will most likely continue to increase his blood sugars.  Advised patient to take the glimepiride as directed in the future.  Also, patient has plans to meet with his primary care physician in approximately 2 weeks; and has plans to discuss/review his diabetic medications and management.

## 2015-01-11 ENCOUNTER — Encounter: Payer: Self-pay | Admitting: Cardiothoracic Surgery

## 2015-01-11 ENCOUNTER — Ambulatory Visit (INDEPENDENT_AMBULATORY_CARE_PROVIDER_SITE_OTHER): Payer: Medicare Other | Admitting: Cardiothoracic Surgery

## 2015-01-11 ENCOUNTER — Ambulatory Visit (HOSPITAL_COMMUNITY)
Admission: RE | Admit: 2015-01-11 | Discharge: 2015-01-11 | Disposition: A | Discharge status: Home / Self Care | Payer: Medicare Other | Source: Ambulatory Visit | Attending: Cardiothoracic Surgery | Admitting: Cardiothoracic Surgery

## 2015-01-11 ENCOUNTER — Ambulatory Visit
Admission: RE | Admit: 2015-01-11 | Discharge: 2015-01-11 | Disposition: A | Discharge status: Home / Self Care | Payer: Medicare Other | Source: Ambulatory Visit | Attending: Cardiothoracic Surgery | Admitting: Cardiothoracic Surgery

## 2015-01-11 ENCOUNTER — Other Ambulatory Visit: Payer: Self-pay | Admitting: *Deleted

## 2015-01-11 VITALS — BP 116/70 | HR 70 | Resp 22 | Ht 69.0 in | Wt 193.0 lb

## 2015-01-11 DIAGNOSIS — J910 Malignant pleural effusion: Secondary | ICD-10-CM | POA: Diagnosis not present

## 2015-01-11 DIAGNOSIS — R52 Pain, unspecified: Secondary | ICD-10-CM

## 2015-01-11 DIAGNOSIS — J90 Pleural effusion, not elsewhere classified: Secondary | ICD-10-CM | POA: Diagnosis not present

## 2015-01-11 DIAGNOSIS — C3492 Malignant neoplasm of unspecified part of left bronchus or lung: Secondary | ICD-10-CM

## 2015-01-11 DIAGNOSIS — J9811 Atelectasis: Secondary | ICD-10-CM | POA: Diagnosis not present

## 2015-01-11 DIAGNOSIS — Z4682 Encounter for fitting and adjustment of non-vascular catheter: Secondary | ICD-10-CM | POA: Diagnosis not present

## 2015-01-11 DIAGNOSIS — R609 Edema, unspecified: Secondary | ICD-10-CM | POA: Diagnosis not present

## 2015-01-11 DIAGNOSIS — M7989 Other specified soft tissue disorders: Secondary | ICD-10-CM | POA: Diagnosis not present

## 2015-01-11 NOTE — Progress Notes (Signed)
PCP is [name], MD Referring Provider is [name], MD  Chief Complaint  Patient presents with  . Routine Post Op    4 wk f/u with cxr to assess pleurX.Marland KitchenDRAINING S/TUES/TH...AVERAGING 500CC EACH TIME    [facility]IN:[facility] scheduled followup for left Pleurx catheter for malignant left pleural effusion, advanced stage lung cancer  Being  treated with chemotherapy by Dr. [name]. Currently on drainage schedule 3 days weekly. Last drainage was 500 cc of clear fluid. Patient complains of significant skin reaction to the adhesive cover. Exit site itself is incorporated and well-healed.  Last CT staging scan performed last month reviewedby me personally showing no evidence of disease in the chest , left Pleurx catheter in good position with small pleural effusion and liver metastasis size stable at roughly 3 cm  Chest x-ray taken today and reviewed by me personally shows decreasing size of left pleural effusion, Pleurx catheter in good position  Patient has days of having intermittent extreme shortness of breath. He seems to get some relief  If he takes an extra dose of Decadron 4 mg by mouth.patient is currently off anticoagulation-previously on Xarelto for transient atrial fibrillation last year. He has bilateral swollen legs somewhat tender. We'll proceed with ultrasound of legs to rule out DVT today.    Past Medical History  Diagnosis Date  . History of diabetes mellitus, type II     resolved with diet, h/o neuropathy  . Diverticulosis of colon   . Hyperlipidemia   . Hypertension   . Fatty liver 02/29/00    abd ultrasound  . Lower back pain   . Systolic murmur 9470    2Decho - normal LV fxn, EF 55%, mild AS, biatrial enlargement  . History of tobacco use quit 1990s  . 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 Personal history of colonic adenomas 07/06/2013  .  Hypertensive retinopathy of both eyes 2015    mild  . 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 Dysrhythmia     Atrial fib (found 07/2014)  . Shortness of breath   . Pneumonia   . GERD (gastroesophageal reflux disease)     occasional  . Arthritis   . Neuropathy   . Constipation   . 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 Diabetes mellitus without complication     Past Surgical History  Procedure Laterality Date  . Colonoscopy  2004  . Colonoscopy  2014    tubular adenoma x1, mod diverticulosis ([place])  . Flexible bronchoscopy  02/2014    WNL  . Video bronchoscopy Bilateral 03/17/2014    Procedure: VIDEO BRONCHOSCOPY WITH FLUORO;  Surgeon: Tanda Rockers, MD;  Location: [facility];  Service: Cardiopulmonary;  Laterality: Bilateral;  . Chest tube insertion Left 08/26/2014    Procedure: INSERTION OF LEFT  PLEURAL DRAINAGE CATHETER;  Surgeon: Ivin Poot, MD;  Location: [facility] OR;  Service: Thoracic;  Laterality: Left;    Family History  Problem Relation Age of Onset  . Stroke Mother     multiple mini strokes  . Hypertension Mother   . Heart disease Maternal Grandfather     MI  . Stroke Paternal Grandmother   . Colon cancer Neg Hx     Social History History  Substance Use Topics  .  Smoking status: Former Smoker -- 3.00 packs/day for 30 years    Types: Cigarettes    Quit date: 12/23/1988  . Smokeless tobacco: Never Used  . Alcohol Use: 12.6 oz/week    21 Glasses of wine per week     Comment: 3-4 wine a day ( 8/03- 2 beers, 2 wines, 2 brandies daily)    Current Outpatient Prescriptions  Medication Sig Dispense Refill  . albuterol (PROVENTIL HFA;VENTOLIN HFA) 108 (90 BASE) MCG/ACT inhaler Inhale 2 puffs into the lungs every 6 (six) hours as needed for wheezing or shortness of breath. 1 Inhaler 0  . amLODipine (NORVASC) 5 MG tablet Take 1 tablet (5 mg total) by mouth daily. (Patient taking differently: Take  2.5 mg by mouth daily. ) 90 tablet 2  . aspirin 81 MG tablet Take 81 mg by mouth daily.    Marland Kitchen atenolol (TENORMIN) 25 MG tablet Take 1 tablet (25 mg total) by mouth 2 (two) times daily. (Patient taking differently: Take 12.5 mg by mouth 2 (two) times daily. ) 180 tablet 2  . cetirizine (ZYRTEC) 10 MG tablet Take 10 mg by mouth daily as needed for allergies or rhinitis.     Marland Kitchen dexamethasone (DECADRON) 4 MG tablet 4 mg by mouth twice a day the day before, day of and day after the chemotherapy every 3 weeks. 40 tablet 1  . folic acid (FOLVITE) 1 MG tablet TAKE 1 TABLET BY MOUTH EVERY DAY 30 tablet 4  . furosemide (LASIX) 40 MG tablet Take 40 mg by mouth.    Marland Kitchen glimepiride (AMARYL) 2 MG tablet Take 1 tablet (2 mg total) by mouth daily with breakfast. Watch for low sugars (Patient taking differently: Take 1 mg by mouth daily with breakfast. Watch for low sugars) 30 tablet 3  . guaiFENesin (MUCINEX) 600 MG 12 hr tablet Take 600 mg by mouth 2 (two) times daily.    Marland Kitchen HYDROcodone-acetaminophen (NORCO/VICODIN) 5-325 MG per tablet Take 1 tablet by mouth every 6 (six) hours as needed for moderate pain.    Marland Kitchen ibuprofen (ADVIL,MOTRIN) 800 MG tablet Take 800 mg by mouth 2 (two) times daily as needed for moderate pain.    Marland Kitchen ketoconazole (NIZORAL) 2 % cream Apply 1 application topically 2 (two) times daily. 60 g 1  . methocarbamol (ROBAXIN) 500 MG tablet Take 500 mg by mouth every 8 (eight) hours as needed (pain).     . Multiple Vitamins-Minerals (ICAPS) CAPS Take by mouth 2 (two) times daily.    . polyethylene glycol (MIRALAX / GLYCOLAX) packet Take 8 g by mouth daily.    . sucralfate (CARAFATE) 1 GM/10ML suspension Take 1 g by mouth 4 (four) times daily as needed (reflux).     No current facility-administered medications for this visit.    Allergies  Allergen Reactions  . Candesartan Cilexetil     REACTION: Muscle spasms  . Diltiazem Hcl     REACTION: Dizziness  . Doxazosin Mesylate Other (See Comments)     REACTION: H/A's  . Nifedipine     REACTION: Leg swelling  . Pravastatin Other (See Comments)    Muscle aches  . Red Yeast Rice     Muscle aches  . Rosuvastatin Other (See Comments)    REACTION: questionable: severe constipation  . 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 Patient with persistent lower extremity with the type edema No productive cough for fever No orthopnea or PND No abdominal pain jaundice Skin around Pleurx catheter is been excoriated by the adhesive sheet  BP 116/70 mmHg  Pulse 70  Resp 22  Ht 5\' 9"  (1.753 m)  Wt 193 lb (87.544 kg)  BMI 28.49 kg/m2  SpO2 98% Physical Exam Alert and comfortable, accompanied by wife Pupils equal conjunctiva pink No palpable adenopathy in the neck Lungs clear Pleurx catheter site well-healed and incorporated Heart rhythm regular without murmur Abdomen mildly obese nontender Bilateral pedal edema with compression stockings Normal gait without focal motor deficit  Diagnostic Tests: Previous CT scan of chest personally reviewed PA and lateral chest x-ray taken today personally reviewed  Impression: Advanced stage lung cancer, controlled chemotherapy Persistent left pleural effusion-previously malignant by cytology controlled with Pleurx catheter  Severe episodic shortness of breath with bilateral leg swelling-rule out DVT with ultrasound of legs today  Plan:return in 4 weeks for left Pleurx catheter check an x-ray. I will relay the results of the ultrasound of legs to the patient. If he has DVT he will need to resume hisXarelto

## 2015-01-11 NOTE — Progress Notes (Signed)
VASCULAR LAB PRELIMINARY  PRELIMINARY  PRELIMINARY  PRELIMINARY  Bilateral lower extremity venous duplex completed.    Preliminary report:  Bilateral:  No evidence of DVT, superficial thrombosis, or Baker's Cyst.   Shawn Espinoza, RVS 01/11/2015, 1:36 PM

## 2015-01-12 ENCOUNTER — Ambulatory Visit: Payer: Medicare Other | Admitting: Internal Medicine

## 2015-01-15 ENCOUNTER — Other Ambulatory Visit: Payer: Self-pay | Admitting: Family Medicine

## 2015-01-15 DIAGNOSIS — D61818 Other pancytopenia: Secondary | ICD-10-CM

## 2015-01-15 DIAGNOSIS — D751 Secondary polycythemia: Secondary | ICD-10-CM

## 2015-01-15 DIAGNOSIS — E099 Drug or chemical induced diabetes mellitus without complications: Secondary | ICD-10-CM

## 2015-01-15 DIAGNOSIS — E785 Hyperlipidemia, unspecified: Secondary | ICD-10-CM

## 2015-01-15 DIAGNOSIS — N400 Enlarged prostate without lower urinary tract symptoms: Secondary | ICD-10-CM

## 2015-01-15 DIAGNOSIS — D696 Thrombocytopenia, unspecified: Secondary | ICD-10-CM

## 2015-01-15 DIAGNOSIS — T380X5A Adverse effect of glucocorticoids and synthetic analogues, initial encounter: Secondary | ICD-10-CM

## 2015-01-15 DIAGNOSIS — E46 Unspecified protein-calorie malnutrition: Secondary | ICD-10-CM

## 2015-01-15 DIAGNOSIS — I10 Essential (primary) hypertension: Secondary | ICD-10-CM

## 2015-01-16 ENCOUNTER — Other Ambulatory Visit: Payer: Medicare Other

## 2015-01-17 ENCOUNTER — Other Ambulatory Visit: Payer: Self-pay | Admitting: Nurse Practitioner

## 2015-01-17 ENCOUNTER — Telehealth: Payer: Self-pay | Admitting: Family Medicine

## 2015-01-17 ENCOUNTER — Telehealth: Payer: Self-pay | Admitting: *Deleted

## 2015-01-17 ENCOUNTER — Other Ambulatory Visit: Payer: Medicare Other

## 2015-01-17 DIAGNOSIS — R197 Diarrhea, unspecified: Secondary | ICD-10-CM

## 2015-01-17 MED ORDER — DIPHENOXYLATE-ATROPINE 2.5-0.025 MG PO TABS: 2.0000 | ORAL_TABLET | Freq: Four times a day (QID) | ORAL | Status: DC | PRN

## 2015-01-17 NOTE — Telephone Encounter (Signed)
Patient Name: Shawn Espinoza DOB: 03/13/1941 Initial Comment caller states husband has diarrhea, gas and stomach cramps Nurse Assessment Nurse: Vallery Sa, RN, [place] Date/Time (Eastern Time): 01/17/2015 8:35:26 AM Confirm and document reason for call. If symptomatic, describe symptoms. ---Caller states her husband developed diarrhea with abdominal cramps that improve after an episode of diarrhea about 3-4 days ago. No fever or vomiting. No blood in the diarrhea. Has the patient traveled out of the country within the last 30 days? ---No Does the patient require triage? ---Yes Related visit to physician within the last 2 weeks? ---Yes Does the PT have any chronic conditions? (i.e. diabetes, asthma, etc.) ---Yes List chronic conditions. ---Lung Cancer (started Chemo a week ago), Diabetes Guidelines Guideline Title Affirmed Question Affirmed Notes Cancer - Diarrhea [1] SEVERE abdominal pain AND [2] age > 1 Final Disposition User Go to ED Now Vallery Sa, RN, [employer]

## 2015-01-17 NOTE — Telephone Encounter (Signed)
Patient Name: Shawn Espinoza DOB: 04-Oct-1941 Initial Comment caller states husband has diarrhea, gas and stomach cramps Nurse Assessment Nurse: Vallery Sa, RN, [place] Date/Time (Eastern Time): 01/17/2015 8:35:26 AM Confirm and document reason for call. If symptomatic, describe symptoms. ---Caller states her husband developed diarrhea with abdominal cramps that improve after an episode of diarrhea about 3-4 days ago. No fever or vomiting. No blood in the diarrhea. Has the patient traveled out of the country within the last 30 days? ---No Does the patient require triage? ---Yes Related visit to physician within the last 2 weeks? ---Yes Does the PT have any chronic conditions? (i.e. diabetes, asthma, etc.) ---Yes List chronic conditions. ---Lung Cancer (started Chemo a week ago), Diabetes Guidelines Guideline Title Affirmed Question Affirmed Notes Cancer - Diarrhea [1] SEVERE abdominal pain AND [2] age > 80 Final Disposition User Go to ED Now Vallery Sa, RN, [employer]

## 2015-01-17 NOTE — Telephone Encounter (Signed)
This provider called and spoke to patient's wife this morning.  Patient is complaining of diarrhea and abdominal cramping.  He has been taking up to 8 Imodium per day; with only moderate effectiveness.  Patient states that he continues to drink and eat a little.  Patient states that he does not feel dehydrated at this point.  Advised patient would call in Lomotil for patient to try.  He may also continue to alternate with Imodium as well.  Patient has plans to return [facility] the morning for labs already; and advised patient to let us know if he feels he needs IV fluid rehydration at that time.  Also, advised patient to call/return to the directly to the emergency department if he has any worsening symptoms whatsoever.  Both patient and his wife were in agreement with this plan.

## 2015-01-17 NOTE — Telephone Encounter (Signed)
SPOKE WITH CINDEE BACON,NP- SHE WILL HAVE HER NURSE, THU BRAY,RN CALL PT.

## 2015-01-17 NOTE — Telephone Encounter (Signed)
Retta Mac, NP spoke with pt and gave detailed instructions.

## 2015-01-17 NOTE — Telephone Encounter (Signed)
@  LOGO@ Provider input needed: IV FLUIDS AND POSSIBLE MEDICATION.  Patient Name: Shawn Espinoza  MRN: [id number] DOB: [birth year]  Date: @TODAY @ Telephone: [phone] (home)  CSN: [id number]   Allergies: is allergic to candesartan cilexetil; diltiazem hcl; doxazosin mesylate; nifedipine; pravastatin; red yeast rice; rosuvastatin; and simvastatin.      Reason for call:  Chief Complaint  Patient presents with  . Diarrhea    SINCE 01/13/15       Patient last received chemotherapy/ treatment on: 01/10/15  Patient was last seen in the office on 01/10/15.  Next appt is LAB 01/18/15. MD VISIT 01/31/15.      Is patient having fevers greater than 100.5? Yes []    No [x]    Comments:    Is patient having uncontrolled pain, or new pain? Yes [x]    No []    Comments:ABDOMINAL CRAMPS   Is patient having new back pain that changes with position Yes []                                   (worsens or eases when laying down?) No [x]    Comments:     Is patient able to eat and drink? Yes [x]    No []    Comments:FLUID INTAKE THE PAST 24 HOURS IS 48 OUNCES    Is patient able to pass stool without difficulty?  Yes [x]    No []    Comments: EIGHT STOOLS IN THE PAST 24 HOURS. ONE STOOL SINCE 3AM     Is patient having uncontrolled nausea?  Yes []    No [x]    Comments:  Summary Based on the above information advised family to  [facility]. ALSO TO EXPECT A RETURN CALL.   @[facility] @  01/17/2015, 9:07 AM

## 2015-01-17 NOTE — Telephone Encounter (Signed)
Noted. Agree with treatment plan. Thank you.

## 2015-01-18 ENCOUNTER — Other Ambulatory Visit (HOSPITAL_BASED_OUTPATIENT_CLINIC_OR_DEPARTMENT_OTHER): Payer: Medicare Other

## 2015-01-18 DIAGNOSIS — C3492 Malignant neoplasm of unspecified part of left bronchus or lung: Secondary | ICD-10-CM

## 2015-01-18 DIAGNOSIS — C3412 Malignant neoplasm of upper lobe, left bronchus or lung: Secondary | ICD-10-CM | POA: Diagnosis not present

## 2015-01-18 LAB — CBC WITH DIFFERENTIAL/PLATELET
BASO%: 0.8 % (ref 0.0–2.0)
BASOS ABS: 0 10*3/uL (ref 0.0–0.1)
EOS%: 1.7 % (ref 0.0–7.0)
Eosinophils Absolute: 0 10*3/uL (ref 0.0–0.5)
HEMATOCRIT: 29 % — AB (ref 38.4–49.9)
HGB: 9.5 g/dL — ABNORMAL LOW (ref 13.0–17.1)
LYMPH#: 0.4 10*3/uL — AB (ref 0.9–3.3)
LYMPH%: 33.3 % (ref 14.0–49.0)
MCH: 31.3 pg (ref 27.2–33.4)
MCHC: 32.8 g/dL (ref 32.0–36.0)
MCV: 95.4 fL (ref 79.3–98.0)
MONO#: 0.2 10*3/uL (ref 0.1–0.9)
MONO%: 14.2 % — ABNORMAL HIGH (ref 0.0–14.0)
NEUT#: 0.6 10*3/uL — ABNORMAL LOW (ref 1.5–6.5)
NEUT%: 50 % (ref 39.0–75.0)
PLATELETS: 76 10*3/uL — AB (ref 140–400)
RBC: 3.04 10*6/uL — ABNORMAL LOW (ref 4.20–5.82)
RDW: 15.7 % — ABNORMAL HIGH (ref 11.0–14.6)
WBC: 1.2 10*3/uL — ABNORMAL LOW (ref 4.0–10.3)

## 2015-01-18 LAB — COMPREHENSIVE METABOLIC PANEL (CC13)
ALT: 22 U/L (ref 0–55)
AST: 21 U/L (ref 5–34)
Albumin: 3.1 g/dL — ABNORMAL LOW (ref 3.5–5.0)
Alkaline Phosphatase: 66 U/L (ref 40–150)
Anion Gap: 10 mEq/L (ref 3–11)
BUN: 18.5 mg/dL (ref 7.0–26.0)
CHLORIDE: 105 meq/L (ref 98–109)
CO2: 25 meq/L (ref 22–29)
Calcium: 8.2 mg/dL — ABNORMAL LOW (ref 8.4–10.4)
Creatinine: 0.8 mg/dL (ref 0.7–1.3)
EGFR: 89 mL/min/{1.73_m2} — AB (ref >90)
GLUCOSE: 201 mg/dL — AB (ref 70–140)
POTASSIUM: 4.1 meq/L (ref 3.5–5.1)
SODIUM: 140 meq/L (ref 136–145)
Total Bilirubin: 0.98 mg/dL (ref 0.20–1.20)
Total Protein: 5.4 g/dL — ABNORMAL LOW (ref 6.4–8.3)

## 2015-01-19 ENCOUNTER — Other Ambulatory Visit (INDEPENDENT_AMBULATORY_CARE_PROVIDER_SITE_OTHER): Payer: Medicare Other

## 2015-01-19 DIAGNOSIS — E46 Unspecified protein-calorie malnutrition: Secondary | ICD-10-CM

## 2015-01-19 DIAGNOSIS — T380X5A Adverse effect of glucocorticoids and synthetic analogues, initial encounter: Secondary | ICD-10-CM

## 2015-01-19 DIAGNOSIS — E785 Hyperlipidemia, unspecified: Secondary | ICD-10-CM | POA: Diagnosis not present

## 2015-01-19 DIAGNOSIS — E099 Drug or chemical induced diabetes mellitus without complications: Secondary | ICD-10-CM

## 2015-01-19 LAB — TSH: TSH: 6.38 u[IU]/mL — AB (ref 0.35–4.50)

## 2015-01-19 LAB — LIPID PANEL
CHOL/HDL RATIO: 4
Cholesterol: 178 mg/dL (ref 0–200)
HDL: 49.7 mg/dL (ref >39.00)
NonHDL: 128.3
Triglycerides: 209 mg/dL — ABNORMAL HIGH (ref 0.0–149.0)
VLDL: 41.8 mg/dL — AB (ref 0.0–40.0)

## 2015-01-19 LAB — HEMOGLOBIN A1C: HEMOGLOBIN A1C: 7.3 % — AB (ref 4.6–6.5)

## 2015-01-19 LAB — LDL CHOLESTEROL, DIRECT: Direct LDL: 115 mg/dL

## 2015-01-23 ENCOUNTER — Encounter: Payer: Self-pay | Admitting: Family Medicine

## 2015-01-23 ENCOUNTER — Ambulatory Visit (INDEPENDENT_AMBULATORY_CARE_PROVIDER_SITE_OTHER): Payer: Medicare Other | Admitting: Family Medicine

## 2015-01-23 ENCOUNTER — Encounter: Payer: Medicare Other | Admitting: Family Medicine

## 2015-01-23 VITALS — BP 124/78 | HR 90 | Temp 98.0°F | Ht 68.75 in | Wt 197.0 lb

## 2015-01-23 DIAGNOSIS — I4891 Unspecified atrial fibrillation: Secondary | ICD-10-CM | POA: Diagnosis not present

## 2015-01-23 DIAGNOSIS — C3492 Malignant neoplasm of unspecified part of left bronchus or lung: Secondary | ICD-10-CM | POA: Diagnosis not present

## 2015-01-23 DIAGNOSIS — R7989 Other specified abnormal findings of blood chemistry: Secondary | ICD-10-CM

## 2015-01-23 DIAGNOSIS — I10 Essential (primary) hypertension: Secondary | ICD-10-CM | POA: Diagnosis not present

## 2015-01-23 DIAGNOSIS — T380X5A Adverse effect of glucocorticoids and synthetic analogues, initial encounter: Secondary | ICD-10-CM | POA: Diagnosis not present

## 2015-01-23 DIAGNOSIS — I5030 Unspecified diastolic (congestive) heart failure: Secondary | ICD-10-CM

## 2015-01-23 DIAGNOSIS — I480 Paroxysmal atrial fibrillation: Secondary | ICD-10-CM | POA: Insufficient documentation

## 2015-01-23 DIAGNOSIS — I499 Cardiac arrhythmia, unspecified: Secondary | ICD-10-CM

## 2015-01-23 DIAGNOSIS — E039 Hypothyroidism, unspecified: Secondary | ICD-10-CM | POA: Insufficient documentation

## 2015-01-23 DIAGNOSIS — E099 Drug or chemical induced diabetes mellitus without complications: Secondary | ICD-10-CM

## 2015-01-23 MED ORDER — METOPROLOL TARTRATE 25 MG PO TABS: 12.5000 mg | ORAL_TABLET | Freq: Two times a day (BID) | ORAL | Status: DC

## 2015-01-23 MED ORDER — GLUCOSE BLOOD VI STRP: ORAL_STRIP | Status: DC

## 2015-01-23 MED ORDER — FUROSEMIDE 80 MG PO TABS: 80.0000 mg | ORAL_TABLET | Freq: Every day | ORAL | Status: DC

## 2015-01-23 MED ORDER — GLIMEPIRIDE 1 MG PO TABS: 1.0000 mg | ORAL_TABLET | Freq: Every day | ORAL | Status: DC

## 2015-01-23 MED ORDER — APIXABAN 5 MG PO TABS: 5.0000 mg | ORAL_TABLET | Freq: Two times a day (BID) | ORAL | Status: DC

## 2015-01-23 MED ORDER — ONETOUCH ULTRA SYSTEM W/DEVICE KIT: 1.0000 | PACK | Freq: Once | Status: DC

## 2015-01-23 MED ORDER — FUROSEMIDE 40 MG PO TABS
40.0000 mg | ORAL_TABLET | Freq: Every day | ORAL | Status: DC
Start: 2015-01-23 — End: 2015-04-04

## 2015-01-23 MED ORDER — ONETOUCH ULTRASOFT LANCETS MISC: Status: DC

## 2015-01-23 MED ORDER — AMLODIPINE BESYLATE 2.5 MG PO TABS: 2.5000 mg | ORAL_TABLET | Freq: Every day | ORAL | Status: DC

## 2015-01-23 NOTE — Assessment & Plan Note (Signed)
Recurrent atrial fibrillation noted today - confirmed with EKG. May have recurred since d/c atenolol, but pt is not decompensated or symptomatic. CHADSVASC score = 3 (DM, HTN hx, and age)  HASBLED score = 1-2 Will restart eliquis 5mg  bid in place of aspirin. Reassess in 2 mo at medicare wellness visit.  EKG - atrial fibrillation rate 90s, normal axis.

## 2015-01-23 NOTE — Patient Instructions (Addendum)
Return in 2 months for medicare wellness visit If we start daily dexamethasone, start daily amaryl 1mg  as well. Continue lasix 40mg  daily. Update me with effect. Stop amlodipine. Trial metoprolol 25mg  1/2 tablet twice daily for blood pressure and heart rate. Continue aspirin daily.

## 2015-01-23 NOTE — Progress Notes (Signed)
BP 124/78 mmHg  Pulse 90  Temp(Src) 98 F (36.7 C) (Oral)  Ht 5' 8.75" (1.746 m)  Wt 197 lb (89.359 kg)  BMI 29.31 kg/m2  SpO2 96%   CC: medicare wellness ==> too early, converted to acute visit  Subjective:    Patient ID: Shawn Espinoza, male    DOB: 02-02-1941, 74 y.o.   MRN: [id number]  HPI: Shawn Espinoza is a 74 y.o. male presenting on 01/23/2015 for Annual Exam   Too early for medicare wellness visit - converted to acute care, has questions about meds.  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 Recent bilateral LE dopplers negative for DVT.   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 Fasting cbg 171 today, averages 150-200. Oncology has ok'd daily prednisone 4mg  for dyspnea.  Lab Results  Component Value Date   CREATININE 0.8 01/18/2015    He's decreased some of his meds on his own.  Currently taking amlodipine 2.5mg  daily. Stopped atenolol, previously stopped eliquis. On lasix 40mg  daily. Making good urine with this. H/o atrial fibrillation during hospitalization 07/2014, but had not had recurrence.   Relevant past medical, surgical, family and social history reviewed and updated as indicated. Interim medical history since our last visit reviewed. Allergies and medications reviewed and updated. Current Outpatient Prescriptions on File Prior to Visit  Medication Sig  . albuterol (PROVENTIL HFA;VENTOLIN HFA) 108 (90 BASE) MCG/ACT inhaler Inhale 2 puffs into the lungs every 6 (six) hours as needed for wheezing or shortness of breath.  . cetirizine (ZYRTEC) 10 MG tablet Take 10 mg by mouth daily as needed for allergies or rhinitis.   Marland Kitchen dexamethasone (DECADRON) 4 MG tablet 4 mg by  mouth twice a day the day before, day of and day after the chemotherapy every 3 weeks.  . diphenoxylate-atropine (LOMOTIL) 2.5-0.025 MG per tablet Take 2 tablets by mouth 4 (four) times daily as needed for diarrhea or loose stools.  . folic acid (FOLVITE) 1 MG tablet TAKE 1 TABLET BY MOUTH EVERY DAY  . HYDROcodone-acetaminophen (NORCO/VICODIN) 5-325 MG per tablet Take 1 tablet by mouth every 6 (six) hours as needed for moderate pain.  Marland Kitchen ibuprofen (ADVIL,MOTRIN) 800 MG tablet Take 800 mg by mouth 2 (two) times daily as needed for moderate pain.  Marland Kitchen ketoconazole (NIZORAL) 2 % cream Apply 1 application topically 2 (two) times daily.  . methocarbamol (ROBAXIN) 500 MG tablet Take 500 mg by mouth every 8 (eight) hours as needed (pain).   . Multiple Vitamins-Minerals (ICAPS) CAPS Take by mouth 2 (two) times daily.  . polyethylene glycol (MIRALAX / GLYCOLAX) packet Take 8 g by mouth daily.  . sucralfate (CARAFATE) 1 GM/10ML suspension Take 1 g by mouth 4 (four) times daily as needed (reflux).   No current facility-administered medications on file prior to visit.   Past Medical History  Diagnosis Date  . History of diabetes mellitus, type II     resolved with diet, h/o neuropathy  . Diverticulosis of colon   . Hyperlipidemia   . Hypertension   . Fatty liver 02/29/00    abd ultrasound  . Lower back pain   . Systolic murmur 6237    2Decho - normal LV fxn, EF 55%, mild AS, biatrial enlargement  .  History of tobacco use quit 1990s  . 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 Personal history of colonic adenomas 07/06/2013  . Hypertensive retinopathy of both eyes 2015    mild  . 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 Dysrhythmia     Atrial fib (found 07/2014)  . Shortness of breath   . Pneumonia   . GERD (gastroesophageal reflux disease)     occasional   . Arthritis   . Neuropathy   . Constipation   . 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 Diabetes mellitus without complication     Review of Systems Per HPI unless specifically indicated above     Objective:    BP 124/78 mmHg  Pulse 90  Temp(Src) 98 F (36.7 C) (Oral)  Ht 5' 8.75" (1.746 m)  Wt 197 lb (89.359 kg)  BMI 29.31 kg/m2  SpO2 96%  Wt Readings from Last 3 Encounters:  01/23/15 197 lb (89.359 kg)  01/11/15 193 lb (87.544 kg)  01/10/15 203 lb 12.8 oz (92.443 kg)    Physical Exam  Constitutional: He appears well-developed and well-nourished. No distress.  HENT:  Mouth/Throat: Oropharynx is clear and moist. No oropharyngeal exudate.  Cardiovascular: Normal rate, normal heart sounds and intact distal pulses.  An irregularly irregular rhythm present.  No murmur heard. Pulmonary/Chest: Effort normal and breath sounds normal. No respiratory distress. He has no wheezes. He has no rales.  Musculoskeletal: He exhibits edema (nonpitting edema bilaterally).  Nursing note and vitals reviewed.  Results for orders placed or performed in visit on 01/19/15  Lipid panel  Result Value Ref Range   Cholesterol 178 0 - 200 mg/dL   Triglycerides 209.0 (H) 0.0 - 149.0 mg/dL   HDL 49.70 >39.00 mg/dL   VLDL 41.8 (H) 0.0 - 40.0 mg/dL   Total CHOL/HDL Ratio 4    NonHDL 128.30   TSH  Result Value Ref Range   TSH 6.38 (H) 0.35 - 4.50 uIU/mL  Hemoglobin A1c  Result Value Ref Range   Hgb A1c MFr Bld 7.3 (H) 4.6 - 6.5 %  LDL cholesterol, direct  Result Value Ref Range   Direct LDL 115.0 mg/dL      Assessment & Plan:   Problem List Items Addressed This Visit    Steroid-induced diabetes mellitus    Considering starting daily decadron for dyspnea and chronic malignant recurrent pleural effusion.  If he starts daily steroid, recommended daily amaryl 1mg  (pt states very sensitive to this med in past). Reviewed A1c - discussed new goal <8%. Discussed  future options of increased amaryl vs starting metformin if sugars elevate more. Lab Results  Component Value Date   HGBA1C 7.3* 01/19/2015        Relevant Medications   glimepiride (AMARYL) tablet   Essential hypertension    Recent dizziness /orthostasis so we tapered off atenolol.  Only taking amlodipine 2.5mg  daily. HR 90s, new irregular heart beat - check EKG. See below. Start metoprolol 12.5mg  bid.      Relevant Medications   furosemide (LASIX) tablet   metoprolol tartrate (LOPRESSOR) tablet   apixaban (ELIQUIS) tablet   CHF (congestive heart failure)    Relatively euvvolemic.      Relevant Medications   furosemide (LASIX) tablet   metoprolol tartrate (LOPRESSOR) tablet  apixaban (ELIQUIS) tablet   Atrial fibrillation, unspecified - Primary    Recurrent atrial fibrillation noted today - confirmed with EKG. May have recurred since d/c atenolol, but pt is not decompensated or symptomatic. CHADSVASC score = 3 (DM, HTN hx, and age)  HASBLED score = 1-2 Will restart eliquis 5mg  bid in place of aspirin. Reassess in 2 mo at medicare wellness visit.  EKG - atrial fibrillation rate 90s, normal axis.      Relevant Medications   furosemide (LASIX) tablet   metoprolol tartrate (LOPRESSOR) tablet   apixaban (ELIQUIS) tablet   Adenocarcinoma of left lung, stage 4    Continue f/u with pulm.      Abnormal TSH    Suggested recheck levels in 2-3 weeks. ?sick euthyroid.       Other Visit Diagnoses    Irregular heart beat        Relevant Orders    EKG 12-Lead (Completed)        Follow up plan: Return in about 2 months (around 03/24/2015), or if symptoms worsen or fail to improve, for medicare wellness.

## 2015-01-23 NOTE — Assessment & Plan Note (Signed)
Continue f/u with pulm.

## 2015-01-23 NOTE — Progress Notes (Signed)
Pre visit review using our clinic review tool, if applicable. No additional management support is needed unless otherwise documented below in the visit note. 

## 2015-01-23 NOTE — Assessment & Plan Note (Addendum)
Considering starting daily decadron for dyspnea and chronic malignant recurrent pleural effusion.  If he starts daily steroid, recommended daily amaryl 1mg  (pt states very sensitive to this med in past). Reviewed A1c - discussed new goal <8%. Discussed future options of increased amaryl vs starting metformin if sugars elevate more. Lab Results  Component Value Date   HGBA1C 7.3* 01/19/2015

## 2015-01-23 NOTE — Assessment & Plan Note (Signed)
Relatively euvvolemic.

## 2015-01-23 NOTE — Assessment & Plan Note (Signed)
Suggested recheck levels in 2-3 weeks. ?sick euthyroid.

## 2015-01-23 NOTE — Assessment & Plan Note (Addendum)
Recent dizziness /orthostasis so we tapered off atenolol.  Only taking amlodipine 2.5mg  daily. HR 90s, new irregular heart beat - check EKG. See below. Start metoprolol 12.5mg  bid.

## 2015-01-24 ENCOUNTER — Other Ambulatory Visit: Payer: Self-pay | Admitting: Internal Medicine

## 2015-01-25 ENCOUNTER — Other Ambulatory Visit: Payer: Self-pay | Admitting: *Deleted

## 2015-01-25 ENCOUNTER — Telehealth: Payer: Self-pay

## 2015-01-25 ENCOUNTER — Other Ambulatory Visit (HOSPITAL_BASED_OUTPATIENT_CLINIC_OR_DEPARTMENT_OTHER): Payer: Medicare Other

## 2015-01-25 ENCOUNTER — Encounter: Payer: Medicare Other | Admitting: Family Medicine

## 2015-01-25 DIAGNOSIS — C3492 Malignant neoplasm of unspecified part of left bronchus or lung: Secondary | ICD-10-CM

## 2015-01-25 DIAGNOSIS — C3412 Malignant neoplasm of upper lobe, left bronchus or lung: Secondary | ICD-10-CM

## 2015-01-25 LAB — CBC WITH DIFFERENTIAL/PLATELET
BASO%: 0.4 % (ref 0.0–2.0)
Basophils Absolute: 0 10*3/uL (ref 0.0–0.1)
EOS ABS: 0 10*3/uL (ref 0.0–0.5)
EOS%: 0.5 % (ref 0.0–7.0)
HCT: 26.2 % — ABNORMAL LOW (ref 38.4–49.9)
HGB: 8.3 g/dL — ABNORMAL LOW (ref 13.0–17.1)
LYMPH#: 0.4 10*3/uL — AB (ref 0.9–3.3)
LYMPH%: 14 % (ref 14.0–49.0)
MCH: 31 pg (ref 27.2–33.4)
MCHC: 31.6 g/dL — ABNORMAL LOW (ref 32.0–36.0)
MCV: 97.9 fL (ref 79.3–98.0)
MONO#: 0.3 10*3/uL (ref 0.1–0.9)
MONO%: 11.8 % (ref 0.0–14.0)
NEUT%: 73.3 % (ref 39.0–75.0)
NEUTROS ABS: 2.2 10*3/uL (ref 1.5–6.5)
Platelets: 71 10*3/uL — ABNORMAL LOW (ref 140–400)
RBC: 2.68 10*6/uL — ABNORMAL LOW (ref 4.20–5.82)
RDW: 17 % — ABNORMAL HIGH (ref 11.0–14.6)
WBC: 2.9 10*3/uL — ABNORMAL LOW (ref 4.0–10.3)

## 2015-01-25 LAB — COMPREHENSIVE METABOLIC PANEL (CC13)
ALT: 15 U/L (ref 0–55)
ANION GAP: 12 meq/L — AB (ref 3–11)
AST: 14 U/L (ref 5–34)
Albumin: 3.1 g/dL — ABNORMAL LOW (ref 3.5–5.0)
Alkaline Phosphatase: 64 U/L (ref 40–150)
BUN: 21.7 mg/dL (ref 7.0–26.0)
CO2: 24 meq/L (ref 22–29)
Calcium: 8.3 mg/dL — ABNORMAL LOW (ref 8.4–10.4)
Chloride: 105 mEq/L (ref 98–109)
Creatinine: 0.9 mg/dL (ref 0.7–1.3)
EGFR: 83 mL/min/{1.73_m2} — ABNORMAL LOW (ref >90)
Glucose: 193 mg/dl — ABNORMAL HIGH (ref 70–140)
POTASSIUM: 4.2 meq/L (ref 3.5–5.1)
Sodium: 140 mEq/L (ref 136–145)
Total Bilirubin: 0.78 mg/dL (ref 0.20–1.20)
Total Protein: 5.6 g/dL — ABNORMAL LOW (ref 6.4–8.3)

## 2015-01-25 MED ORDER — ONETOUCH ULTRA SYSTEM W/DEVICE KIT
PACK | Status: DC
Start: 2015-01-25 — End: 2015-05-02

## 2015-01-25 MED ORDER — GLUCOSE BLOOD VI STRP: ORAL_STRIP | Status: DC

## 2015-01-25 MED ORDER — ONETOUCH ULTRASOFT LANCETS MISC: Status: DC

## 2015-01-25 MED ORDER — ONETOUCH ULTRA SYSTEM W/DEVICE KIT: PACK | Status: DC

## 2015-01-25 NOTE — Telephone Encounter (Signed)
Mrs Grabel said the glucose meter, strips and lancets were supposed to be sent to [place]. Resent to [place].

## 2015-01-27 ENCOUNTER — Telehealth: Payer: Self-pay

## 2015-01-27 ENCOUNTER — Other Ambulatory Visit: Payer: Self-pay | Admitting: *Deleted

## 2015-01-27 MED ORDER — LANCETS MISC: Status: DC

## 2015-01-27 MED ORDER — METOPROLOL TARTRATE 25 MG PO TABS
25.0000 mg | ORAL_TABLET | Freq: Two times a day (BID) | ORAL | Status: DC
Start: 2015-01-27 — End: 2015-09-04

## 2015-01-27 NOTE — Telephone Encounter (Signed)
Patient's wife notified and verbalized understanding. She verified that he is currently taking 12.5mg  of metoprolol BID and lasix 40mg  QD. He will come in if no better next week. BP today was 131/81.

## 2015-01-27 NOTE — Telephone Encounter (Signed)
How is heart rate? If >80 and blood pressure >120/70, recommend increase metoprolol to 25mg  bid or a full tablet twice daily. New dose at pharmacy [place] may be from daily dexamethasone. If not better with this recommend f/u in office next week. plz verify blood pressure meds he's taking

## 2015-01-27 NOTE — Telephone Encounter (Addendum)
Spoke with wife. Pulse 80-90. Ok to take 25mg  metoprolol. Will f/u if dyspnea not improved next week. Denies cough, fever. Leg swellings persists but endorses good UOP with lasix 40mg 

## 2015-01-27 NOTE — Telephone Encounter (Signed)
Mrs Audi left v/m pt seen 01/23/15 and meds were changed; since appt pt has increased SOB and pt experiencing flushing. Mrs Denzer wants to know if dosage should be adjusted on new BP med. Mrs Belmontes request cb.

## 2015-01-30 ENCOUNTER — Telehealth: Payer: Self-pay | Admitting: *Deleted

## 2015-01-30 DIAGNOSIS — J910 Malignant pleural effusion: Secondary | ICD-10-CM | POA: Diagnosis not present

## 2015-01-30 NOTE — Telephone Encounter (Signed)
Insurance requiring different Dx code for lancets. Will not cover them for steroid-induced diabetes. [facility] [employer]

## 2015-01-31 ENCOUNTER — Ambulatory Visit (HOSPITAL_BASED_OUTPATIENT_CLINIC_OR_DEPARTMENT_OTHER): Payer: Medicare Other

## 2015-01-31 ENCOUNTER — Ambulatory Visit: Payer: Medicare Other | Admitting: Internal Medicine

## 2015-01-31 ENCOUNTER — Encounter: Payer: Self-pay | Admitting: Internal Medicine

## 2015-01-31 ENCOUNTER — Telehealth: Payer: Self-pay | Admitting: *Deleted

## 2015-01-31 ENCOUNTER — Telehealth: Payer: Self-pay | Admitting: Internal Medicine

## 2015-01-31 ENCOUNTER — Ambulatory Visit (HOSPITAL_BASED_OUTPATIENT_CLINIC_OR_DEPARTMENT_OTHER): Payer: Medicare Other | Admitting: Internal Medicine

## 2015-01-31 ENCOUNTER — Ambulatory Visit: Payer: Medicare Other

## 2015-01-31 ENCOUNTER — Other Ambulatory Visit (HOSPITAL_BASED_OUTPATIENT_CLINIC_OR_DEPARTMENT_OTHER): Payer: Medicare Other

## 2015-01-31 ENCOUNTER — Other Ambulatory Visit: Payer: Medicare Other

## 2015-01-31 VITALS — BP 147/79 | HR 83 | Temp 98.1°F | Resp 20 | Ht 68.75 in | Wt 196.8 lb

## 2015-01-31 DIAGNOSIS — Z5111 Encounter for antineoplastic chemotherapy: Secondary | ICD-10-CM

## 2015-01-31 DIAGNOSIS — I4891 Unspecified atrial fibrillation: Secondary | ICD-10-CM

## 2015-01-31 DIAGNOSIS — J90 Pleural effusion, not elsewhere classified: Secondary | ICD-10-CM

## 2015-01-31 DIAGNOSIS — I890 Lymphedema, not elsewhere classified: Secondary | ICD-10-CM

## 2015-01-31 DIAGNOSIS — C3492 Malignant neoplasm of unspecified part of left bronchus or lung: Secondary | ICD-10-CM

## 2015-01-31 DIAGNOSIS — C3412 Malignant neoplasm of upper lobe, left bronchus or lung: Secondary | ICD-10-CM

## 2015-01-31 LAB — CBC WITH DIFFERENTIAL/PLATELET
BASO%: 0.9 % (ref 0.0–2.0)
BASOS ABS: 0 10*3/uL (ref 0.0–0.1)
EOS ABS: 0 10*3/uL (ref 0.0–0.5)
EOS%: 0 % (ref 0.0–7.0)
HEMATOCRIT: 32.4 % — AB (ref 38.4–49.9)
HEMOGLOBIN: 10.2 g/dL — AB (ref 13.0–17.1)
LYMPH%: 9.2 % — AB (ref 14.0–49.0)
MCH: 31.5 pg (ref 27.2–33.4)
MCHC: 31.5 g/dL — ABNORMAL LOW (ref 32.0–36.0)
MCV: 100.2 fL — AB (ref 79.3–98.0)
MONO#: 0.5 10*3/uL (ref 0.1–0.9)
MONO%: 11.1 % (ref 0.0–14.0)
NEUT%: 78.8 % — ABNORMAL HIGH (ref 39.0–75.0)
NEUTROS ABS: 3.6 10*3/uL (ref 1.5–6.5)
Platelets: 261 10*3/uL (ref 140–400)
RBC: 3.23 10*6/uL — ABNORMAL LOW (ref 4.20–5.82)
RDW: 20.7 % — AB (ref 11.0–14.6)
WBC: 4.5 10*3/uL (ref 4.0–10.3)
lymph#: 0.4 10*3/uL — ABNORMAL LOW (ref 0.9–3.3)

## 2015-01-31 LAB — COMPREHENSIVE METABOLIC PANEL (CC13)
ALBUMIN: 3.2 g/dL — AB (ref 3.5–5.0)
ALT: 18 U/L (ref 0–55)
AST: 13 U/L (ref 5–34)
Alkaline Phosphatase: 70 U/L (ref 40–150)
Anion Gap: 13 mEq/L — ABNORMAL HIGH (ref 3–11)
BILIRUBIN TOTAL: 0.64 mg/dL (ref 0.20–1.20)
BUN: 23.5 mg/dL (ref 7.0–26.0)
CO2: 23 mEq/L (ref 22–29)
CREATININE: 0.9 mg/dL (ref 0.7–1.3)
Calcium: 8.7 mg/dL (ref 8.4–10.4)
Chloride: 103 mEq/L (ref 98–109)
EGFR: 86 mL/min/{1.73_m2} — AB (ref >90)
Glucose: 340 mg/dl — ABNORMAL HIGH (ref 70–140)
Potassium: 4.1 mEq/L (ref 3.5–5.1)
Sodium: 140 mEq/L (ref 136–145)
Total Protein: 5.9 g/dL — ABNORMAL LOW (ref 6.4–8.3)

## 2015-01-31 LAB — TECHNOLOGIST REVIEW

## 2015-01-31 MED ORDER — SODIUM CHLORIDE 0.9 % IV SOLN
495.0000 mg/m2 | Freq: Once | INTRAVENOUS | Status: AC
  Administered 2015-01-31: 1000 mg via INTRAVENOUS
  Filled 2015-01-31: qty 40

## 2015-01-31 MED ORDER — CARBOPLATIN CHEMO INTRADERMAL TEST DOSE 100MCG/0.02ML
100.0000 ug | Freq: Once | INTRADERMAL | Status: AC
  Administered 2015-01-31: 100 ug via INTRADERMAL
  Filled 2015-01-31: qty 0.01

## 2015-01-31 MED ORDER — SODIUM CHLORIDE 0.9 % IV SOLN
Freq: Once | INTRAVENOUS | Status: AC
  Administered 2015-01-31: 11:00:00 via INTRAVENOUS

## 2015-01-31 MED ORDER — SODIUM CHLORIDE 0.9 % IV SOLN
514.5000 mg | Freq: Once | INTRAVENOUS | Status: AC
  Administered 2015-01-31: 510 mg via INTRAVENOUS
  Filled 2015-01-31: qty 51

## 2015-01-31 MED ORDER — DEXAMETHASONE SODIUM PHOSPHATE 20 MG/5ML IJ SOLN
20.0000 mg | Freq: Once | INTRAMUSCULAR | Status: AC
  Administered 2015-01-31: 20 mg via INTRAVENOUS

## 2015-01-31 MED ORDER — ONDANSETRON 16 MG/50ML IVPB (CHCC)
16.0000 mg | Freq: Once | INTRAVENOUS | Status: AC
  Administered 2015-01-31: 16 mg via INTRAVENOUS

## 2015-01-31 MED ORDER — ONDANSETRON 16 MG/50ML IVPB (CHCC)
INTRAVENOUS | Status: AC
  Filled 2015-01-31: qty 16

## 2015-01-31 MED ORDER — DEXAMETHASONE SODIUM PHOSPHATE 20 MG/5ML IJ SOLN
INTRAMUSCULAR | Status: AC
  Filled 2015-01-31: qty 5

## 2015-01-31 NOTE — Telephone Encounter (Signed)
Pt confirmed labs/ov per 02/09 POF, gave pt AVS.... KJ, sent msg to add chemo

## 2015-01-31 NOTE — Progress Notes (Signed)
[place] [phone]   Fax:([phone]) [place], [place] [address]  DIAGNOSIS: Stage IIIB/IV non-small cell lung cancer, adenocarcinoma diagnosed in March of 2015.  PRIOR THERAPY:  1) Systemic chemotherapy with carboplatin for AUC of 5 and Alimta 500 mg/M2 every 3 weeks. First dose expected on 04/13/2014. Status post 6 cycles. 2)  Maintenance chemotherapy with single agent Alimta 500 mg/M2 every 3 weeks. First cycle on 09/06/2014. Status post 1 cycle.  CURRENT THERAPY: Second course of systemic chemotherapy with carboplatin for AUC of 5 and Alimta 500 MG/M2 every 3 weeks. First cycle 11/08/2014. Status post 4 cycles.  INTERVAL HISTORY: Shawn Espinoza 74 y.o. male returns to the clinic today for follow up visit. He is tolerating his treatment well with no significant adverse effects. He denied having any significant chest pain but continues to have shortness of breath and mild cough with no hemoptysis. He has no significant fever or chills, no nausea or vomiting. He continues with drainage of the Pleurx catheter but now down to around 300 ML 3 times a week. He is here today to start cycle #5 of his chemotherapy was carboplatin and Alimta.  MEDICAL HISTORY: Past Medical History  Diagnosis Date  . History of diabetes mellitus, type II     resolved with diet, h/o neuropathy  . Diverticulosis of colon   . Hyperlipidemia   . Hypertension   . Fatty liver 02/29/00    abd ultrasound  . Lower back pain   . Systolic murmur 3335    2Decho - normal LV fxn, EF 55%, mild AS, biatrial enlargement  . History of tobacco use quit 1990s  . 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 Personal history of colonic adenomas 07/06/2013  . Hypertensive retinopathy of both eyes 2015    mild  .  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 Dysrhythmia     Atrial fib (found 07/2014)  . Shortness of breath   . Pneumonia   . GERD (gastroesophageal reflux disease)     occasional  . Arthritis   . Neuropathy   . Constipation   . 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 Diabetes mellitus without complication     ALLERGIES:  is allergic to candesartan cilexetil; diltiazem hcl; doxazosin mesylate; nifedipine; pravastatin; red yeast rice; rosuvastatin; and simvastatin.  MEDICATIONS:  Current Outpatient Prescriptions  Medication Sig Dispense Refill  . albuterol (PROVENTIL HFA;VENTOLIN HFA) 108 (90 BASE) MCG/ACT inhaler Inhale 2 puffs into the lungs every 6 (six) hours as needed for wheezing or shortness of breath. 1 Inhaler 0  . apixaban (ELIQUIS) 5 MG TABS tablet Take 1 tablet (5 mg total) by mouth 2 (two) times daily. 60 tablet 6  . Blood Glucose Monitoring Suppl (ONE TOUCH ULTRA SYSTEM KIT) W/DEVICE KIT Use as directed to check blood sugar Dx: E09.9 1 each 0  . cetirizine (ZYRTEC) 10 MG tablet Take 10 mg by mouth daily as needed for allergies or rhinitis.     Marland Kitchen dexamethasone (DECADRON) 4 MG tablet 4 MG BY MOUTH TWICE A DAY THE DAY BEFORE, DAY OF AND DAY AFTER THE CHEMOTHERAPY EVERY 3 WEEKS. 40 tablet 1  . diphenoxylate-atropine (LOMOTIL)  2.5-0.025 MG per tablet Take 2 tablets by mouth 4 (four) times daily as needed for diarrhea or loose stools. 30 tablet 0  . folic acid (FOLVITE) 1 MG tablet TAKE 1 TABLET BY MOUTH EVERY DAY 30 tablet 4  . furosemide (LASIX) 40 MG tablet Take 1 tablet (40 mg total) by mouth daily. 30 tablet 6  . glimepiride (AMARYL) 1 MG tablet Take 1 tablet (1 mg total) by mouth daily with breakfast. Watch for low sugars 30 tablet 11  . glucose blood test strip Use as instructed to check blood sugar once daily and as needed. Dx: E09.9 200 each 3  . HYDROcodone-acetaminophen (NORCO/VICODIN) 5-325 MG per tablet  Take 1 tablet by mouth every 6 (six) hours as needed for moderate pain.    Marland Kitchen ibuprofen (ADVIL,MOTRIN) 800 MG tablet Take 800 mg by mouth 2 (two) times daily as needed for moderate pain.    Marland Kitchen ketoconazole (NIZORAL) 2 % cream Apply 1 application topically 2 (two) times daily. 60 g 1  . Lancets MISC Use to check sugar once daily and as needed Dx: W96.7 **ONE TOUCH DELICA** 591 each 3  . methocarbamol (ROBAXIN) 500 MG tablet Take 500 mg by mouth every 8 (eight) hours as needed (pain).     . metoprolol tartrate (LOPRESSOR) 25 MG tablet Take 1 tablet (25 mg total) by mouth 2 (two) times daily. 60 tablet 6  . Multiple Vitamins-Minerals (ICAPS) CAPS Take by mouth 2 (two) times daily.    . polyethylene glycol (MIRALAX / GLYCOLAX) packet Take 8 g by mouth daily.    . sucralfate (CARAFATE) 1 GM/10ML suspension Take 1 g by mouth 4 (four) times daily as needed (reflux).    Marland Kitchen UNABLE TO FIND OTC potassium.  Unsure of dose.  Takes 1 tablet daily     No current facility-administered medications for this visit.    SURGICAL HISTORY:  Past Surgical History  Procedure Laterality Date  . Colonoscopy  2004  . Colonoscopy  2014    tubular adenoma x1, mod diverticulosis ([place])  . Flexible bronchoscopy  02/2014    WNL  . Video bronchoscopy Bilateral 03/17/2014    Procedure: VIDEO BRONCHOSCOPY WITH FLUORO;  Surgeon: Tanda Rockers, MD;  Location: [facility];  Service: Cardiopulmonary;  Laterality: Bilateral;  . Chest tube insertion Left 08/26/2014    Procedure: INSERTION OF LEFT  PLEURAL DRAINAGE CATHETER;  Surgeon: Ivin Poot, MD;  Location: [facility];  Service: Thoracic;  Laterality: Left;    REVIEW OF SYSTEMS:  A comprehensive review of systems was negative except for: Constitutional: positive for fatigue Respiratory: positive for dyspnea on exertion   PHYSICAL EXAMINATION: General appearance: alert, cooperative, fatigued and no distress Head: Normocephalic, without obvious abnormality, atraumatic Neck:  no adenopathy, no JVD, supple, symmetrical, trachea midline and thyroid not enlarged, symmetric, no tenderness/mass/nodules Lymph nodes: Cervical, supraclavicular, and axillary nodes normal. Resp: diminished breath sounds LLL and dullness to percussion LLL Back: symmetric, no curvature. ROM normal. No CVA tenderness. Cardio: regular rate and rhythm, S1, S2 normal, no murmur, click, rub or gallop GI: soft, non-tender; bowel sounds normal; no masses,  no organomegaly Extremities: edema 2+ Neurologic: Alert and oriented X 3, normal strength and tone. Normal symmetric reflexes. Normal coordination and gait  ECOG PERFORMANCE STATUS: 1 - Symptomatic but completely ambulatory  Blood pressure 147/79, pulse 83, temperature 98.1 F (36.7 C), temperature source Oral, resp. rate 20, height 5' 8.75" (1.746 m), weight 196 lb 12.8 oz (89.268 kg), SpO2 100 %.  LABORATORY DATA: Lab Results  Component Value Date   WBC 4.5 01/31/2015   HGB 10.2* 01/31/2015   HCT 32.4* 01/31/2015   MCV 100.2* 01/31/2015   PLT 261 01/31/2015      Chemistry      Component Value Date/Time   NA 140 01/31/2015 0952   NA 140 08/26/2014 1246   K 4.1 01/31/2015 0952   K 4.5 08/26/2014 1246   CL 100 08/26/2014 1246   CO2 23 01/31/2015 0952   CO2 29 08/26/2014 1246   BUN 23.5 01/31/2015 0952   BUN 16 08/26/2014 1246   CREATININE 0.9 01/31/2015 0952   CREATININE 0.76 08/26/2014 1246   CREATININE 0.83 02/22/2014 1314      Component Value Date/Time   CALCIUM 8.7 01/31/2015 0952   CALCIUM 8.7 08/26/2014 1246   ALKPHOS 70 01/31/2015 0952   ALKPHOS 68 08/13/2014 1222   AST 13 01/31/2015 0952   AST 21 08/13/2014 1222   ALT 18 01/31/2015 0952   ALT 15 08/13/2014 1222   BILITOT 0.64 01/31/2015 0952   BILITOT 1.3* 08/13/2014 1222       RADIOGRAPHIC STUDIES: Dg Chest 2 View  01/11/2015   CLINICAL DATA:  Left pleural effusion, left drainage pleural catheter  EXAM: CHEST  2 VIEW  COMPARISON:  01/06/2015  FINDINGS:  Cardiomediastinal silhouette is stable. No acute infiltrate or pulmonary edema. A pleural catheter is noted in left lower lateral hemi thorax. Small residual left pleural effusion with left basilar atelectasis. There is no pneumothorax.  IMPRESSION: Left pleural catheter in place. Small residual left pleural effusion with left basilar atelectasis. No pneumothorax.   Electronically Signed   By: Lahoma Crocker M.D.   On: 01/11/2015 11:12   Ct Chest W Contrast  01/06/2015   CLINICAL DATA:  Followup stage for left lung adenocarcinoma. Ongoing chemotherapy.  EXAM: CT CHEST, ABDOMEN, AND PELVIS WITH CONTRAST  TECHNIQUE: Multidetector CT imaging of the chest, abdomen and pelvis was performed following the standard protocol during bolus administration of intravenous contrast.  CONTRAST:  18mL OMNIPAQUE IOHEXOL 300 MG/ML  SOLN  COMPARISON:  11/07/2014  FINDINGS: CT CHEST FINDINGS  Mediastinum/Lymph Nodes: No masses or pathologically enlarged lymph nodes identified.  Lungs/Pleura: No pulmonary infiltrate or mass identified. Small left pleural effusion has increased in size with left pleural catheter remaining in place. No definite pleural mass visualized.  Musculoskeletal/Soft Tissues: No suspicious bone lesions or other significant chest wall abnormality.  CT ABDOMEN AND PELVIS FINDINGS  Hepatobiliary: Hypovascular lesion in the liver dome as well as several tiny adjacent subcentimeter low-attenuation lesions remain stable. No new or enlarging liver masses are identified. Gallbladder is unremarkable. No evidence of biliary dilatation.  Pancreas: No mass, inflammatory changes, or other parenchymal abnormality identified.  Spleen:  Within normal limits in size and appearance.  Adrenal Glands:  No mass identified.  Kidneys/Urinary Tract: No masses identified. No evidence of hydronephrosis.  Stomach/Bowel/Peritoneum: No evidence of wall thickening, mass, or obstruction.  Vascular/Lymphatic: 13 mm portacaval lymph node remains  stable on image 65. No other Pathologically enlarged lymph nodes identified. No other significant abnormality identified.  Reproductive:  No mass or other significant abnormality identified.  Other:  None.  Musculoskeletal:  No suspicious bone lesions identified.  IMPRESSION: Increased size of small left pleural effusion, with left pleural drain remaining in place. No associated mass or lymphadenopathy identified within the thorax.  Stable hepatic metastatic disease. No new or progressive disease within the abdomen or pelvis.   Electronically Signed  By: Earle Gell M.D.   On: 01/06/2015 10:57   Ct Abdomen Pelvis W Contrast  01/06/2015   CLINICAL DATA:  Followup stage for left lung adenocarcinoma. Ongoing chemotherapy.  EXAM: CT CHEST, ABDOMEN, AND PELVIS WITH CONTRAST  TECHNIQUE: Multidetector CT imaging of the chest, abdomen and pelvis was performed following the standard protocol during bolus administration of intravenous contrast.  CONTRAST:  147mL OMNIPAQUE IOHEXOL 300 MG/ML  SOLN  COMPARISON:  11/07/2014  FINDINGS: CT CHEST FINDINGS  Mediastinum/Lymph Nodes: No masses or pathologically enlarged lymph nodes identified.  Lungs/Pleura: No pulmonary infiltrate or mass identified. Small left pleural effusion has increased in size with left pleural catheter remaining in place. No definite pleural mass visualized.  Musculoskeletal/Soft Tissues: No suspicious bone lesions or other significant chest wall abnormality.  CT ABDOMEN AND PELVIS FINDINGS  Hepatobiliary: Hypovascular lesion in the liver dome as well as several tiny adjacent subcentimeter low-attenuation lesions remain stable. No new or enlarging liver masses are identified. Gallbladder is unremarkable. No evidence of biliary dilatation.  Pancreas: No mass, inflammatory changes, or other parenchymal abnormality identified.  Spleen:  Within normal limits in size and appearance.  Adrenal Glands:  No mass identified.  Kidneys/Urinary Tract: No masses  identified. No evidence of hydronephrosis.  Stomach/Bowel/Peritoneum: No evidence of wall thickening, mass, or obstruction.  Vascular/Lymphatic: 13 mm portacaval lymph node remains stable on image 65. No other Pathologically enlarged lymph nodes identified. No other significant abnormality identified.  Reproductive:  No mass or other significant abnormality identified.  Other:  None.  Musculoskeletal:  No suspicious bone lesions identified.  IMPRESSION: Increased size of small left pleural effusion, with left pleural drain remaining in place. No associated mass or lymphadenopathy identified within the thorax.  Stable hepatic metastatic disease. No new or progressive disease within the abdomen or pelvis.   Electronically Signed   By: Earle Gell M.D.   On: 01/06/2015 10:57   ASSESSMENT AND PLAN: this is a very pleasant 74 years old white male with:  1) Stage IV non-small cell lung cancer, adenocarcinoma presenting with large right upper lobe lung mass in addition to mediastinal and bilateral hilar lymphadenopathy as well as cervical lymphadenopathy and left pleural effusion. The patient completed a course systemic chemotherapy with carboplatin and Alimta status post 6 cycles. This was followed by 3 cycles of maintenance chemotherapy with single agent Alimta. The patient tolerated his treatment well. He had evidence for disease progression and the patient was started on treatment again with carboplatin and Alimta status post 4 cycles. The patient history rating his treatment much better now and he also feels well. We'll proceed with cycle #5 today as scheduled. He would come back for follow-up visit in 3 weeks with the start of cycle #6  2) Recurrent left pleural effusion: He is status post Pleurx catheter placement. Continue drainage of the pleural fluid 3 times a week as recommended by Dr. Prescott Gum   3) Atrial fibrillation: continue Aliquis.  4) lower extremity edema: Improved. He will continue Lasix  on as-needed basis.  He was advised to call immediately if he has any concerning symptoms in the interval.  The patient voices understanding of current disease status and treatment options and is in agreement with the current care plan.  All questions were answered. The patient knows to call the clinic with any problems, questions or concerns. We can certainly see the patient much sooner if necessary.  Disclaimer: This note was dictated with voice recognition software. Similar sounding words can  inadvertently be transcribed and may not be corrected upon review.       

## 2015-01-31 NOTE — Telephone Encounter (Signed)
Per staff message and POF I have scheduled appts. Advised scheduler of appts. JMW  

## 2015-02-01 ENCOUNTER — Ambulatory Visit (INDEPENDENT_AMBULATORY_CARE_PROVIDER_SITE_OTHER): Payer: Medicare Other | Admitting: Podiatrist

## 2015-02-01 ENCOUNTER — Encounter: Payer: Self-pay | Admitting: Podiatrist

## 2015-02-01 ENCOUNTER — Telehealth: Payer: Self-pay | Admitting: Internal Medicine

## 2015-02-01 VITALS — BP 146/92 | HR 89 | Resp 16

## 2015-02-01 DIAGNOSIS — M79676 Pain in unspecified toe(s): Secondary | ICD-10-CM | POA: Diagnosis not present

## 2015-02-01 DIAGNOSIS — B351 Tinea unguium: Secondary | ICD-10-CM | POA: Diagnosis not present

## 2015-02-01 MED ORDER — LANCETS MISC: Status: DC

## 2015-02-01 NOTE — Patient Instructions (Addendum)
Diabetes and Foot Care Diabetes may cause you to have problems because of poor blood supply (circulation) to your feet and legs. This may cause the skin on your feet to become thinner, break easier, and heal more slowly. Your skin may become dry, and the skin may peel and crack. You may also have nerve damage in your legs and feet causing decreased feeling in them. You may not notice minor injuries to your feet that could lead to infections or more serious problems. Taking care of your feet is one of the most important things you can do for yourself.  HOME CARE INSTRUCTIONS  Wear shoes at all times, even in the house. Do not go barefoot. Bare feet are easily injured.  Check your feet daily for blisters, cuts, and redness. If you cannot see the bottom of your feet, use a mirror or ask someone for help.  Wash your feet with warm water (do not use hot water) and mild soap. Then pat your feet and the areas between your toes until they are completely dry. Do not soak your feet as this can dry your skin.  Apply a moisturizing lotion or petroleum jelly (that does not contain alcohol and is unscented) to the skin on your feet and to dry, brittle toenails. Do not apply lotion between your toes.  Trim your toenails straight across. Do not dig under them or around the cuticle. File the edges of your nails with an emery board or nail file.  Do not cut corns or calluses or try to remove them with medicine.  Wear clean socks or stockings every day. Make sure they are not too tight. Do not wear knee-high stockings since they may decrease blood flow to your legs.  Wear shoes that fit properly and have enough cushioning. To break in new shoes, wear them for just a few hours a day. This prevents you from injuring your feet. Always look in your shoes before you put them on to be sure there are no objects inside.  Do not cross your legs. This may decrease the blood flow to your feet.  If you find a minor scrape,  cut, or break in the skin on your feet, keep it and the skin around it clean and dry. These areas may be cleansed with mild soap and water. Do not cleanse the area with peroxide, alcohol, or iodine.  When you remove an adhesive bandage, be sure not to damage the skin around it.  If you have a wound, look at it several times a day to make sure it is healing.  Do not use heating pads or hot water bottles. They may burn your skin. If you have lost feeling in your feet or legs, you may not know it is happening until it is too late.  Make sure your health care provider performs a complete foot exam at least annually or more often if you have foot problems. Report any cuts, sores, or bruises to your health care provider immediately. SEEK MEDICAL CARE IF:   You have an injury that is not healing.  You have cuts or breaks in the skin.  You have an ingrown nail.  You notice redness on your legs or feet.  You feel burning or tingling in your legs or feet.  You have pain or cramps in your legs and feet.  Your legs or feet are numb.  Your feet always feel cold. SEEK IMMEDIATE MEDICAL CARE IF:   There is increasing redness,   swelling, or pain in or around a wound.  There is a red line that goes up your leg.  Pus is coming from a wound.  You develop a fever or as directed by your health care provider.  You notice a bad smell coming from an ulcer or wound. Document Released: 12/06/2000 Document Revised: 08/11/2013 Document Reviewed: 05/18/2013 [facility] Patient Information 2015 [place], [place]. This information is not intended to replace advice given to you by your health care provider. Make sure you discuss any questions you have with your health care provider.    DIABETIC SHOES-  Diabetic shoes and accomidative inserts are indicated for those persons who have Diabetes and who are at an increased for a foot ulceration.  This requires patients to have decreased feeling in the feet,  circulation problems, as well as an abnormal foot structure, or a combination of these disorders.  As podiatrists, we can recommend a diabetic shoe to your primary care doctor or endocrinologist HOWEVER, it is up to them to decide if you are eligible or in need of these shoes.  Before we can take measurements or do the ordering of a diabetic shoe and accomidative insert, it is required that we get a signed authorization from your physician both confirming you have Diabetes and that you are at risk for an ulceration.  Please note, if you physician will not sign for the shoes, there is no way we can go around him or her.  You are welcome to place an order for the shoes, however the financial responsibility is up to you and your insurance cannot be billed.   Once, approval is granted for the diabetic shoes and inserts we will make you a separate appointment to be measured, casted, and for you to choose the diabetic shoe.  We will then call you when they are ready for dispensing and you will be seen to make sure the shoes and inserts fit your foot properly.  As this is a multi-step process, it generally takes 4-8 weeks from start to finish.  You cooperation is appreciated.  If it has been more than 6 weeks from the date you ordered your diabetic shoe and you have not heard from our office, please give [place] a call.

## 2015-02-01 NOTE — Progress Notes (Signed)
   Subjective:    Patient ID: Shawn Espinoza, male    DOB: 12-04-1941, 74 y.o.   MRN: [id number]  HPI Comments: "I just need to get my toenails cut"  Patient states he can't get down to cut his toenails anymore. He does have neuropathy and some swelling in his feet.  Diabetic - last A1C was 7.6  Diabetes      Review of Systems  All other systems reviewed and are negative.      Objective:   Physical Exam GENERAL APPEARANCE: Alert, conversant. Appropriately groomed. No acute distress.  VASCULAR: Pedal pulses palpable at 1/4 DP and PT bilateral.  Capillary refill time is immediate to all digits,  Proximal to distal temperature is warm to warm.   NEUROLOGIC: sensation is intact epicritically and protectively to 5.07 monofilament at 2/5 sites bilateral.  Light touch is intact bilateral, vibratory sensation decreased bilateral, achilles tendon reflex is intact bilateral.  MUSCULOSKELETAL: acceptable muscle strength, tone and stability bilateral.  Intrinsic muscluature intact bilateral.    DERMATOLOGIC: skin color, texture, and turger are within normal limits.  No preulcerative lesions are seen, no interdigital maceration noted.  No open lesions present.  Digital nails are symptomatic with yellow brown discoloration present with thickening of nail plates and mycotic appearance x 10.     Assessment & Plan:  Diabetes with neuropathy, symptomatic mycotic toenails.  Plan:  Diabetic foot evaluation carried out.  Symptomatic nails debrided without complication.  Recommend routine pallative care and foot checks every 3 months.

## 2015-02-01 NOTE — Telephone Encounter (Signed)
pt wife called to r/s 2.24 appt to later time...done....ok adn aware

## 2015-02-01 NOTE — Telephone Encounter (Signed)
Sent in with new dx code.

## 2015-02-06 ENCOUNTER — Telehealth: Payer: Self-pay

## 2015-02-06 NOTE — Telephone Encounter (Signed)
pts wife request status for metoprolol refill;advised sent to [place].Shawn Espinoza will contact [facility] [address] [facility]

## 2015-02-08 ENCOUNTER — Other Ambulatory Visit (HOSPITAL_BASED_OUTPATIENT_CLINIC_OR_DEPARTMENT_OTHER): Payer: Medicare Other

## 2015-02-08 DIAGNOSIS — C3412 Malignant neoplasm of upper lobe, left bronchus or lung: Secondary | ICD-10-CM | POA: Diagnosis not present

## 2015-02-08 DIAGNOSIS — C3492 Malignant neoplasm of unspecified part of left bronchus or lung: Secondary | ICD-10-CM

## 2015-02-08 LAB — COMPREHENSIVE METABOLIC PANEL (CC13)
ALBUMIN: 3.6 g/dL (ref 3.5–5.0)
ALT: 22 U/L (ref 0–55)
ANION GAP: 9 meq/L (ref 3–11)
AST: 15 U/L (ref 5–34)
Alkaline Phosphatase: 72 U/L (ref 40–150)
BUN: 34.3 mg/dL — ABNORMAL HIGH (ref 7.0–26.0)
CALCIUM: 9.2 mg/dL (ref 8.4–10.4)
CHLORIDE: 106 meq/L (ref 98–109)
CO2: 28 mEq/L (ref 22–29)
Creatinine: 1.1 mg/dL (ref 0.7–1.3)
EGFR: 67 mL/min/{1.73_m2} — AB (ref >90)
GLUCOSE: 261 mg/dL — AB (ref 70–140)
POTASSIUM: 4.6 meq/L (ref 3.5–5.1)
Sodium: 142 mEq/L (ref 136–145)
Total Bilirubin: 0.63 mg/dL (ref 0.20–1.20)
Total Protein: 5.8 g/dL — ABNORMAL LOW (ref 6.4–8.3)

## 2015-02-08 LAB — CBC WITH DIFFERENTIAL/PLATELET
BASO%: 0.2 % (ref 0.0–2.0)
Basophils Absolute: 0 10*3/uL (ref 0.0–0.1)
EOS ABS: 0 10*3/uL (ref 0.0–0.5)
EOS%: 0.2 % (ref 0.0–7.0)
HCT: 32.5 % — ABNORMAL LOW (ref 38.4–49.9)
HGB: 10.4 g/dL — ABNORMAL LOW (ref 13.0–17.1)
LYMPH%: 14.1 % (ref 14.0–49.0)
MCH: 31.7 pg (ref 27.2–33.4)
MCHC: 32 g/dL (ref 32.0–36.0)
MCV: 99.1 fL — ABNORMAL HIGH (ref 79.3–98.0)
MONO#: 0.3 10*3/uL (ref 0.1–0.9)
MONO%: 7.9 % (ref 0.0–14.0)
NEUT%: 77.6 % — ABNORMAL HIGH (ref 39.0–75.0)
NEUTROS ABS: 3.4 10*3/uL (ref 1.5–6.5)
PLATELETS: 113 10*3/uL — AB (ref 140–400)
RBC: 3.28 10*6/uL — AB (ref 4.20–5.82)
RDW: 17.3 % — AB (ref 11.0–14.6)
WBC: 4.3 10*3/uL (ref 4.0–10.3)
lymph#: 0.6 10*3/uL — ABNORMAL LOW (ref 0.9–3.3)

## 2015-02-13 ENCOUNTER — Telehealth: Payer: Self-pay | Admitting: Internal Medicine

## 2015-02-13 ENCOUNTER — Other Ambulatory Visit: Payer: Self-pay | Admitting: Cardiothoracic Surgery

## 2015-02-13 DIAGNOSIS — C3492 Malignant neoplasm of unspecified part of left bronchus or lung: Secondary | ICD-10-CM

## 2015-02-13 NOTE — Telephone Encounter (Signed)
pt wife called to r/s lab to 2.23 from 2.24 due to cateract appt...done....pt aware of new d.t

## 2015-02-14 ENCOUNTER — Other Ambulatory Visit (HOSPITAL_BASED_OUTPATIENT_CLINIC_OR_DEPARTMENT_OTHER): Payer: Medicare Other

## 2015-02-14 DIAGNOSIS — C3412 Malignant neoplasm of upper lobe, left bronchus or lung: Secondary | ICD-10-CM

## 2015-02-14 DIAGNOSIS — C3492 Malignant neoplasm of unspecified part of left bronchus or lung: Secondary | ICD-10-CM

## 2015-02-14 LAB — CBC WITH DIFFERENTIAL/PLATELET
BASO%: 0.8 % (ref 0.0–2.0)
BASOS ABS: 0.1 10*3/uL (ref 0.0–0.1)
EOS ABS: 0 10*3/uL (ref 0.0–0.5)
EOS%: 0.4 % (ref 0.0–7.0)
HEMATOCRIT: 33.7 % — AB (ref 38.4–49.9)
HGB: 10.5 g/dL — ABNORMAL LOW (ref 13.0–17.1)
LYMPH%: 6.3 % — AB (ref 14.0–49.0)
MCH: 31.4 pg (ref 27.2–33.4)
MCHC: 31.2 g/dL — ABNORMAL LOW (ref 32.0–36.0)
MCV: 100.5 fL — ABNORMAL HIGH (ref 79.3–98.0)
MONO#: 0.3 10*3/uL (ref 0.1–0.9)
MONO%: 4 % (ref 0.0–14.0)
NEUT%: 88.5 % — ABNORMAL HIGH (ref 39.0–75.0)
NEUTROS ABS: 5.7 10*3/uL (ref 1.5–6.5)
PLATELETS: 75 10*3/uL — AB (ref 140–400)
RBC: 3.35 10*6/uL — ABNORMAL LOW (ref 4.20–5.82)
RDW: 20.6 % — AB (ref 11.0–14.6)
WBC: 6.5 10*3/uL (ref 4.0–10.3)
lymph#: 0.4 10*3/uL — ABNORMAL LOW (ref 0.9–3.3)

## 2015-02-14 LAB — COMPREHENSIVE METABOLIC PANEL (CC13)
ALK PHOS: 75 U/L (ref 40–150)
ALT: 26 U/L (ref 0–55)
AST: 18 U/L (ref 5–34)
Albumin: 3.4 g/dL — ABNORMAL LOW (ref 3.5–5.0)
Anion Gap: 11 mEq/L (ref 3–11)
BILIRUBIN TOTAL: 0.61 mg/dL (ref 0.20–1.20)
BUN: 32.2 mg/dL — ABNORMAL HIGH (ref 7.0–26.0)
CO2: 24 mEq/L (ref 22–29)
Calcium: 8.7 mg/dL (ref 8.4–10.4)
Chloride: 104 mEq/L (ref 98–109)
Creatinine: 1 mg/dL (ref 0.7–1.3)
EGFR: 73 mL/min/{1.73_m2} — ABNORMAL LOW (ref >90)
Glucose: 288 mg/dl — ABNORMAL HIGH (ref 70–140)
Potassium: 4.3 mEq/L (ref 3.5–5.1)
SODIUM: 139 meq/L (ref 136–145)
Total Protein: 5.8 g/dL — ABNORMAL LOW (ref 6.4–8.3)

## 2015-02-14 LAB — TECHNOLOGIST REVIEW

## 2015-02-15 ENCOUNTER — Ambulatory Visit
Admission: RE | Admit: 2015-02-15 | Discharge: 2015-02-15 | Disposition: A | Discharge status: Home / Self Care | Payer: Medicare Other | Source: Ambulatory Visit | Attending: Cardiothoracic Surgery | Admitting: Cardiothoracic Surgery

## 2015-02-15 ENCOUNTER — Other Ambulatory Visit: Payer: Medicare Other

## 2015-02-15 ENCOUNTER — Ambulatory Visit (INDEPENDENT_AMBULATORY_CARE_PROVIDER_SITE_OTHER): Payer: Medicare Other | Admitting: Cardiothoracic Surgery

## 2015-02-15 ENCOUNTER — Encounter: Payer: Self-pay | Admitting: Cardiothoracic Surgery

## 2015-02-15 VITALS — BP 137/91 | HR 77 | Resp 22 | Ht 68.75 in | Wt 196.0 lb

## 2015-02-15 DIAGNOSIS — E11319 Type 2 diabetes mellitus with unspecified diabetic retinopathy without macular edema: Secondary | ICD-10-CM | POA: Diagnosis not present

## 2015-02-15 DIAGNOSIS — H3531 Nonexudative age-related macular degeneration: Secondary | ICD-10-CM | POA: Diagnosis not present

## 2015-02-15 DIAGNOSIS — H2510 Age-related nuclear cataract, unspecified eye: Secondary | ICD-10-CM | POA: Diagnosis not present

## 2015-02-15 DIAGNOSIS — J910 Malignant pleural effusion: Secondary | ICD-10-CM

## 2015-02-15 DIAGNOSIS — H25013 Cortical age-related cataract, bilateral: Secondary | ICD-10-CM | POA: Diagnosis not present

## 2015-02-15 DIAGNOSIS — J90 Pleural effusion, not elsewhere classified: Secondary | ICD-10-CM | POA: Diagnosis not present

## 2015-02-15 DIAGNOSIS — Z4682 Encounter for fitting and adjustment of non-vascular catheter: Secondary | ICD-10-CM | POA: Diagnosis not present

## 2015-02-15 DIAGNOSIS — H2511 Age-related nuclear cataract, right eye: Secondary | ICD-10-CM | POA: Diagnosis not present

## 2015-02-15 DIAGNOSIS — C3490 Malignant neoplasm of unspecified part of unspecified bronchus or lung: Secondary | ICD-10-CM | POA: Diagnosis not present

## 2015-02-15 DIAGNOSIS — C3492 Malignant neoplasm of unspecified part of left bronchus or lung: Secondary | ICD-10-CM

## 2015-02-15 NOTE — Progress Notes (Signed)
PCP is [name], MD Referring Provider is Curt Bears, MD  Chief Complaint  Patient presents with  . Malignant Pleural Effusion    1 month f/u with a cxr to assess left pleurX.Marland KitchenMarland KitchenDRAINING SUNDAY AND WEDNESDAY    HPI:one month followup for left malignant pleural effusion-history of adenocarcinoma treated by Dr. [name]. The patient has had 5 cycles of chemotherapy. He is still very functional. The left Pleurx drainage has decreased significantly over the past 2 weeks. Drainage schedule is currently twice weekly, removing 100 cc or less each event. Chest x-ray today shows a very small left pleural effusion. We will reduce the drainage schedule to once weekly in order to reduce skin reaction to the dressing changes. If the drainage persists less than 100 cc per week for 2-3 weeks then we will consider removal of catheter.   Past Medical History  Diagnosis Date  . History of diabetes mellitus, type II     resolved with diet, h/o neuropathy  . Diverticulosis of colon   . Hyperlipidemia   . Hypertension   . Fatty liver 02/29/00    abd ultrasound  . Lower back pain   . Systolic murmur 0737    2Decho - normal LV fxn, EF 55%, mild AS, biatrial enlargement  . History of tobacco use quit 1990s  . 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 Personal history of colonic adenomas 07/06/2013  . Hypertensive retinopathy of both eyes 2015    mild  . 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 Dysrhythmia     Atrial fib (found 07/2014)  . Shortness of breath   . Pneumonia   . GERD (gastroesophageal reflux disease)     occasional  . Arthritis   . Neuropathy   . Constipation   . 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 Diabetes mellitus without complication     Past Surgical History  Procedure Laterality Date  .  Colonoscopy  2004  . Colonoscopy  2014    tubular adenoma x1, mod diverticulosis (Gessner)  . Flexible bronchoscopy  02/2014    WNL  . Video bronchoscopy Bilateral 03/17/2014    Procedure: VIDEO BRONCHOSCOPY WITH FLUORO;  Surgeon: Tanda Rockers, MD;  Location: [facility];  Service: Cardiopulmonary;  Laterality: Bilateral;  . Chest tube insertion Left 08/26/2014    Procedure: INSERTION OF LEFT  PLEURAL DRAINAGE CATHETER;  Surgeon: Ivin Poot, MD;  Location: [facility] OR;  Service: Thoracic;  Laterality: Left;    Family History  Problem Relation Age of Onset  . Stroke Mother     multiple mini strokes  . Hypertension Mother   . Heart disease Maternal Grandfather     MI  . Stroke Paternal Grandmother   . Colon cancer Neg Hx     Social History History  Substance Use Topics  . Smoking status: Former Smoker -- 3.00 packs/day for 30 years    Types: Cigarettes    Quit date: 12/23/1988  . Smokeless tobacco: Never Used  . Alcohol Use: 12.6 oz/week    21 Glasses of wine per week     Comment: 3-4 wine a day ( 8/03- 2 beers, 2 wines, 2 brandies daily)    Current Outpatient Prescriptions  Medication Sig Dispense Refill  . albuterol (PROVENTIL HFA;VENTOLIN HFA)  108 (90 BASE) MCG/ACT inhaler Inhale 2 puffs into the lungs every 6 (six) hours as needed for wheezing or shortness of breath. 1 Inhaler 0  . apixaban (ELIQUIS) 5 MG TABS tablet Take 1 tablet (5 mg total) by mouth 2 (two) times daily. 60 tablet 6  . Blood Glucose Monitoring Suppl (ONE TOUCH ULTRA SYSTEM KIT) W/DEVICE KIT Use as directed to check blood sugar Dx: E09.9 1 each 0  . cetirizine (ZYRTEC) 10 MG tablet Take 10 mg by mouth daily as needed for allergies or rhinitis.     Marland Kitchen dexamethasone (DECADRON) 4 MG tablet Take 4 mg by mouth daily.    . diphenoxylate-atropine (LOMOTIL) 2.5-0.025 MG per tablet Take 2 tablets by mouth 4 (four) times daily as needed for diarrhea or loose stools. 30 tablet 0  . folic acid (FOLVITE) 1 MG tablet TAKE  1 TABLET BY MOUTH EVERY DAY 30 tablet 4  . furosemide (LASIX) 40 MG tablet Take 1 tablet (40 mg total) by mouth daily. 30 tablet 6  . glimepiride (AMARYL) 1 MG tablet Take 1 tablet (1 mg total) by mouth daily with breakfast. Watch for low sugars 30 tablet 11  . glucose blood test strip Use as instructed to check blood sugar once daily and as needed. Dx: E09.9 200 each 3  . HYDROcodone-acetaminophen (NORCO/VICODIN) 5-325 MG per tablet Take 1 tablet by mouth every 6 (six) hours as needed for moderate pain.    . Lancets MISC Use to check sugar once daily and as needed Dx: D17.6 **ONE TOUCH DELICA** 160 each 3  . methocarbamol (ROBAXIN) 500 MG tablet Take 500 mg by mouth every 8 (eight) hours as needed (pain).     . metoprolol tartrate (LOPRESSOR) 25 MG tablet Take 1 tablet (25 mg total) by mouth 2 (two) times daily. 60 tablet 6  . Multiple Vitamins-Minerals (ICAPS) CAPS Take by mouth 2 (two) times daily.    . polyethylene glycol (MIRALAX / GLYCOLAX) packet Take 8 g by mouth daily.    . sucralfate (CARAFATE) 1 GM/10ML suspension Take 1 g by mouth 4 (four) times daily as needed (reflux).    Marland Kitchen UNABLE TO FIND OTC potassium.  Unsure of dose.  Takes 1 tablet daily    . dexamethasone (DECADRON) 4 MG tablet 4 MG BY MOUTH TWICE A DAY THE DAY BEFORE, DAY OF AND DAY AFTER THE CHEMOTHERAPY EVERY 3 WEEKS. 40 tablet 1  . ibuprofen (ADVIL,MOTRIN) 800 MG tablet Take 800 mg by mouth 2 (two) times daily as needed for moderate pain.    Marland Kitchen ketoconazole (NIZORAL) 2 % cream Apply 1 application topically 2 (two) times daily. (Patient not taking: Reported on 02/15/2015) 60 g 1   No current facility-administered medications for this visit.    Allergies  Allergen Reactions  . Candesartan Cilexetil     REACTION: Muscle spasms  . Diltiazem Hcl     REACTION: Dizziness  . Doxazosin Mesylate Other (See Comments)    REACTION: H/A's  . Nifedipine     REACTION: Leg swelling  . Pravastatin Other (See Comments)    Muscle aches   . Red Yeast Rice     Muscle aches  . Rosuvastatin Other (See Comments)    REACTION: questionable: severe constipation  . Simvastatin Other (See Comments)    REACTION: Muscle aches    Review of Systems    General:   Normal appetite, no weight loss, no fever or night sweats Cardiac:     No angina, positive symptoms of  probable right heart failure with leg and abdominal edema Respiratory:  mildShortness of breath,  no home oxygen, no productive cough, no                  sleep apnea, no CPAP at night, no hemoptysis, no COPD GI:           No difficulty swallowing, no reflux, no hiatal hernia-heartburn, no chronic                          abdominal pain, no hematochezia, no hematemesis, no melena GU:        No dysuria, no frequency, no UTI recently, no hematuria, no kidney stones,               No BPH Vascular: No pain suggestive of claudication, no varicose veins, no DVT, no nonhealing foot ulcer, no rest pain suggestive of ischemia, no DVT by duplex scan last month Neuro:   No stroke, no TIAs, no seizures, no neuropathy, no gait instability, no                             memory/cognitive dysfunction                Musculoskeletal:  No arthritis, no joint swelling, no difficulty walking,                ++  Mild decreased    Mobility Skin:     No rash, no ulcerations or pressure sores, skin reaction to left Pleurx dressing change significantly better with topical Neosporin Psych:    No anxiety, no depression, Eyes:    No change in vision, no amaurosis, no eye surgery ENT: positive for cataract surgery pending soon-   No hearing loss, no loose or painful teeth, no dentures, no recent dental                          procedure Hematologic:  + easy bruising, no bleeding disorder, no frequent epistaxes Endocrine:  No diabetes,  - checks CBG at home   BP 137/91 mmHg  Pulse 77  Resp 22  Ht 5' 8.75" (1.746 m)  Wt 196 lb (88.905 kg)  BMI 29.16 kg/m2  SpO2 98% Physical Exam Gen.-middle-aged  male no acute distress HEENT-normocephalic dentition clean Neck-supple without JVD Lungs-clear bilaterally, left Pleurx site clean and dry Cardiac-regular rhythm without murmur \ Abdomen-soft, nontender without pulsatile mass Extremities-1-2+ pedal edema Neurologic-normal gait no focal motor deficit  Diagnostic Tests: Chest x-ray personally reviewed which shows minimal left pleural effusion, Pleurx catheter in good position, no new lung masses, no infiltrate or pneumonia  Impression: Significant drop off in left Pleurx catheter drainage Will reduce the drainage schedule to once weekly and consider removal catheter if the drainage remains minimal. The patient will contact us accordingly and we will plan a followup office visit with chest x-ray in 4 weeks  Plan:return in 4 weeks with chest x-ray

## 2015-02-21 ENCOUNTER — Telehealth: Payer: Self-pay | Admitting: Internal Medicine

## 2015-02-21 ENCOUNTER — Ambulatory Visit (HOSPITAL_BASED_OUTPATIENT_CLINIC_OR_DEPARTMENT_OTHER): Payer: Medicare Other

## 2015-02-21 ENCOUNTER — Ambulatory Visit (HOSPITAL_BASED_OUTPATIENT_CLINIC_OR_DEPARTMENT_OTHER): Payer: Medicare Other | Admitting: Physician Assistant

## 2015-02-21 ENCOUNTER — Other Ambulatory Visit (HOSPITAL_BASED_OUTPATIENT_CLINIC_OR_DEPARTMENT_OTHER): Payer: Medicare Other

## 2015-02-21 ENCOUNTER — Encounter: Payer: Self-pay | Admitting: Physician Assistant

## 2015-02-21 VITALS — BP 164/82 | HR 77 | Temp 97.6°F | Resp 18 | Ht 68.75 in | Wt 203.9 lb

## 2015-02-21 DIAGNOSIS — I4891 Unspecified atrial fibrillation: Secondary | ICD-10-CM

## 2015-02-21 DIAGNOSIS — C3411 Malignant neoplasm of upper lobe, right bronchus or lung: Secondary | ICD-10-CM

## 2015-02-21 DIAGNOSIS — C3492 Malignant neoplasm of unspecified part of left bronchus or lung: Secondary | ICD-10-CM

## 2015-02-21 DIAGNOSIS — Z5111 Encounter for antineoplastic chemotherapy: Secondary | ICD-10-CM

## 2015-02-21 DIAGNOSIS — J918 Pleural effusion in other conditions classified elsewhere: Secondary | ICD-10-CM

## 2015-02-21 LAB — CBC WITH DIFFERENTIAL/PLATELET
BASO%: 1 % (ref 0.0–2.0)
Basophils Absolute: 0.1 10*3/uL (ref 0.0–0.1)
EOS ABS: 0 10*3/uL (ref 0.0–0.5)
EOS%: 0.2 % (ref 0.0–7.0)
HCT: 37.3 % — ABNORMAL LOW (ref 38.4–49.9)
HGB: 11.8 g/dL — ABNORMAL LOW (ref 13.0–17.1)
LYMPH%: 6.4 % — ABNORMAL LOW (ref 14.0–49.0)
MCH: 32.4 pg (ref 27.2–33.4)
MCHC: 31.8 g/dL — ABNORMAL LOW (ref 32.0–36.0)
MCV: 102 fL — ABNORMAL HIGH (ref 79.3–98.0)
MONO#: 0.5 10*3/uL (ref 0.1–0.9)
MONO%: 8.4 % (ref 0.0–14.0)
NEUT#: 5.4 10*3/uL (ref 1.5–6.5)
NEUT%: 84 % — ABNORMAL HIGH (ref 39.0–75.0)
Platelets: 188 10*3/uL (ref 140–400)
RBC: 3.66 10*6/uL — AB (ref 4.20–5.82)
RDW: 21.9 % — AB (ref 11.0–14.6)
WBC: 6.4 10*3/uL (ref 4.0–10.3)
lymph#: 0.4 10*3/uL — ABNORMAL LOW (ref 0.9–3.3)

## 2015-02-21 LAB — COMPREHENSIVE METABOLIC PANEL (CC13)
ALK PHOS: 73 U/L (ref 40–150)
ALT: 34 U/L (ref 0–55)
AST: 21 U/L (ref 5–34)
Albumin: 3.5 g/dL (ref 3.5–5.0)
Anion Gap: 14 mEq/L — ABNORMAL HIGH (ref 3–11)
BILIRUBIN TOTAL: 0.58 mg/dL (ref 0.20–1.20)
BUN: 27.4 mg/dL — ABNORMAL HIGH (ref 7.0–26.0)
CO2: 25 meq/L (ref 22–29)
Calcium: 8.8 mg/dL (ref 8.4–10.4)
Chloride: 102 mEq/L (ref 98–109)
Creatinine: 1 mg/dL (ref 0.7–1.3)
EGFR: 77 mL/min/{1.73_m2} — AB (ref >90)
GLUCOSE: 251 mg/dL — AB (ref 70–140)
POTASSIUM: 4.4 meq/L (ref 3.5–5.1)
SODIUM: 141 meq/L (ref 136–145)
TOTAL PROTEIN: 6 g/dL — AB (ref 6.4–8.3)

## 2015-02-21 LAB — TECHNOLOGIST REVIEW: Technologist Review: 2

## 2015-02-21 MED ORDER — DEXAMETHASONE SODIUM PHOSPHATE 20 MG/5ML IJ SOLN
INTRAMUSCULAR | Status: AC
  Filled 2015-02-21: qty 5

## 2015-02-21 MED ORDER — DEXAMETHASONE SODIUM PHOSPHATE 20 MG/5ML IJ SOLN
20.0000 mg | Freq: Once | INTRAMUSCULAR | Status: AC
  Administered 2015-02-21: 20 mg via INTRAVENOUS

## 2015-02-21 MED ORDER — CYANOCOBALAMIN 1000 MCG/ML IJ SOLN
1000.0000 ug | Freq: Once | INTRAMUSCULAR | Status: AC
  Administered 2015-02-21: 1000 ug via INTRAMUSCULAR

## 2015-02-21 MED ORDER — SODIUM CHLORIDE 0.9 % IV SOLN
514.5000 mg | Freq: Once | INTRAVENOUS | Status: AC
  Administered 2015-02-21: 510 mg via INTRAVENOUS
  Filled 2015-02-21: qty 51

## 2015-02-21 MED ORDER — SODIUM CHLORIDE 0.9 % IV SOLN
Freq: Once | INTRAVENOUS | Status: AC
  Administered 2015-02-21: 12:00:00 via INTRAVENOUS

## 2015-02-21 MED ORDER — SODIUM CHLORIDE 0.9 % IV SOLN
495.0000 mg/m2 | Freq: Once | INTRAVENOUS | Status: AC
  Administered 2015-02-21: 1000 mg via INTRAVENOUS
  Filled 2015-02-21: qty 40

## 2015-02-21 MED ORDER — ONDANSETRON 16 MG/50ML IVPB (CHCC)
INTRAVENOUS | Status: AC
  Filled 2015-02-21: qty 16

## 2015-02-21 MED ORDER — ONDANSETRON 16 MG/50ML IVPB (CHCC)
16.0000 mg | Freq: Once | INTRAVENOUS | Status: AC
  Administered 2015-02-21: 16 mg via INTRAVENOUS

## 2015-02-21 MED ORDER — CARBOPLATIN CHEMO INTRADERMAL TEST DOSE 100MCG/0.02ML
100.0000 ug | Freq: Once | INTRADERMAL | Status: AC
  Administered 2015-02-21: 100 ug via INTRADERMAL
  Filled 2015-02-21: qty 0.01

## 2015-02-21 MED ORDER — CYANOCOBALAMIN 1000 MCG/ML IJ SOLN
INTRAMUSCULAR | Status: AC
  Filled 2015-02-21: qty 1

## 2015-02-21 NOTE — Telephone Encounter (Signed)
gv adn prnted appt sched and avs for pt for March....gv pt barium

## 2015-02-21 NOTE — Progress Notes (Addendum)
[place] [phone]   Fax:([phone]) [place], [place] [address]  DIAGNOSIS: Stage IIIB/IV non-small cell lung cancer, adenocarcinoma diagnosed in March of 2015.  PRIOR THERAPY:  1) Systemic chemotherapy with carboplatin for AUC of 5 and Alimta 500 mg/M2 every 3 weeks. First dose expected on 04/13/2014. Status post 6 cycles. 2)  Maintenance chemotherapy with single agent Alimta 500 mg/M2 every 3 weeks. First cycle on 09/06/2014. Status post 1 cycle.  CURRENT THERAPY: Second course of systemic chemotherapy with carboplatin for AUC of 5 and Alimta 500 MG/M2 every 3 weeks. First cycle 11/08/2014. Status post 5 cycles.  INTERVAL HISTORY: Shawn Espinoza 74 y.o. male returns to the clinic today for follow up visit. He is tolerating his treatment well with no significant adverse effects. He denied having any significant chest pain but continues to have shortness of breath and mild cough with no hemoptysis. He has no significant fever or chills, no nausea or vomiting. He continues with drainage of the Pleurx catheter but now down to around 100 ML once a week. He complains of leg weakness particularly in his thigh muscles. He finds it difficult to climb stairs. His currently using a stationary bike for about 20-30 minutes 2-3 times a week.. The only problem with this form is exercises that his stationary bike is in the basement and he has difficulty climbing the stairs.he denied any numbness or tingling in his back or into the lower extremities, and has full control of his bowels and bladder He and his wife are planning to go back to the [facility] for their exercise. He notes some intermittent swelling of his legs and feet.He is here today to start cycle #6 of his chemotherapy was carboplatin and Alimta.  MEDICAL HISTORY: Past Medical History  Diagnosis Date  . History of diabetes mellitus, type II    resolved with diet, h/o neuropathy  . Diverticulosis of colon   . Hyperlipidemia   . Hypertension   . Fatty liver 02/29/00    abd ultrasound  . Lower back pain   . Systolic murmur 1287    2Decho - normal LV fxn, EF 55%, mild AS, biatrial enlargement  . History of tobacco use quit 1990s  . 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 Personal history of colonic adenomas 07/06/2013  . Hypertensive retinopathy of both eyes 2015    mild  . 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 Dysrhythmia     Atrial fib (found 07/2014)  . Shortness of breath   . Pneumonia   . GERD (gastroesophageal reflux disease)     occasional  . Arthritis   . Neuropathy   . Constipation   . 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 Diabetes mellitus without complication     ALLERGIES:  is allergic to candesartan cilexetil; diltiazem hcl; doxazosin mesylate; nifedipine; pravastatin; red yeast rice; rosuvastatin; and simvastatin.  MEDICATIONS:  Current Outpatient Prescriptions  Medication Sig Dispense Refill  . albuterol (PROVENTIL HFA;VENTOLIN HFA) 108 (90 BASE) MCG/ACT inhaler Inhale 2 puffs into the lungs every 6 (six) hours as needed for wheezing or shortness of breath. 1 Inhaler 0  . apixaban (ELIQUIS) 5 MG TABS  tablet Take 1 tablet (5 mg total) by mouth 2 (two) times daily. 60 tablet 6  . Blood Glucose Monitoring Suppl (ONE TOUCH ULTRA SYSTEM KIT) W/DEVICE KIT Use as directed to check blood sugar Dx: E09.9 1 each 0  . cetirizine (ZYRTEC) 10 MG tablet Take 10 mg by mouth daily as needed for allergies or rhinitis.     Marland Kitchen dexamethasone (DECADRON) 4 MG tablet 4 MG BY MOUTH TWICE A DAY THE DAY BEFORE, DAY OF AND DAY AFTER THE CHEMOTHERAPY EVERY 3 WEEKS. 40 tablet 1  . dexamethasone (DECADRON) 4 MG tablet Take 4 mg by mouth daily.    .  diphenoxylate-atropine (LOMOTIL) 2.5-0.025 MG per tablet Take 2 tablets by mouth 4 (four) times daily as needed for diarrhea or loose stools. 30 tablet 0  . folic acid (FOLVITE) 1 MG tablet TAKE 1 TABLET BY MOUTH EVERY DAY 30 tablet 4  . furosemide (LASIX) 40 MG tablet Take 1 tablet (40 mg total) by mouth daily. 30 tablet 6  . glimepiride (AMARYL) 1 MG tablet Take 1 tablet (1 mg total) by mouth daily with breakfast. Watch for low sugars 30 tablet 11  . glucose blood test strip Use as instructed to check blood sugar once daily and as needed. Dx: E09.9 200 each 3  . HYDROcodone-acetaminophen (NORCO/VICODIN) 5-325 MG per tablet Take 1 tablet by mouth every 6 (six) hours as needed for moderate pain.    Marland Kitchen ibuprofen (ADVIL,MOTRIN) 800 MG tablet Take 800 mg by mouth 2 (two) times daily as needed for moderate pain.    . Lancets MISC Use to check sugar once daily and as needed Dx: F81.0 **ONE TOUCH DELICA** 175 each 3  . methocarbamol (ROBAXIN) 500 MG tablet Take 500 mg by mouth every 8 (eight) hours as needed (pain).     . metoprolol tartrate (LOPRESSOR) 25 MG tablet Take 1 tablet (25 mg total) by mouth 2 (two) times daily. 60 tablet 6  . Multiple Vitamins-Minerals (ICAPS) CAPS Take by mouth 2 (two) times daily.    . polyethylene glycol (MIRALAX / GLYCOLAX) packet Take 8 g by mouth daily.    . sucralfate (CARAFATE) 1 GM/10ML suspension Take 1 g by mouth 4 (four) times daily as needed (reflux).    Marland Kitchen UNABLE TO FIND OTC potassium.  Unsure of dose.  Takes 1 tablet daily    . ketoconazole (NIZORAL) 2 % cream Apply 1 application topically 2 (two) times daily. (Patient not taking: Reported on 02/15/2015) 60 g 1   No current facility-administered medications for this visit.    SURGICAL HISTORY:  Past Surgical History  Procedure Laterality Date  . Colonoscopy  2004  . Colonoscopy  2014    tubular adenoma x1, mod diverticulosis (Gessner)  . Flexible bronchoscopy  02/2014    WNL  . Video bronchoscopy Bilateral  03/17/2014    Procedure: VIDEO BRONCHOSCOPY WITH FLUORO;  Surgeon: Tanda Rockers, MD;  Location: [facility];  Service: Cardiopulmonary;  Laterality: Bilateral;  . Chest tube insertion Left 08/26/2014    Procedure: INSERTION OF LEFT  PLEURAL DRAINAGE CATHETER;  Surgeon: Ivin Poot, MD;  Location: [facility] OR;  Service: Thoracic;  Laterality: Left;    REVIEW OF SYSTEMS:  A comprehensive review of systems was negative except for: Constitutional: positive for fatigue Respiratory: positive for dyspnea on exertion Cardiovascular: positive for lower extremity edema Musculoskeletal: positive for muscle weakness and affecting his thighs Neurological: positive for weakness   PHYSICAL EXAMINATION: General appearance: alert, cooperative, fatigued and no  distress Head: Normocephalic, without obvious abnormality, atraumatic Neck: no adenopathy, no JVD, supple, symmetrical, trachea midline and thyroid not enlarged, symmetric, no tenderness/mass/nodules Lymph nodes: Cervical, supraclavicular, and axillary nodes normal. Resp: diminished breath sounds LLL and dullness to percussion LLL Back: symmetric, no curvature. ROM normal. No CVA tenderness. Cardio: regular rate and rhythm, S1, S2 normal, no murmur, click, rub or gallop GI: soft, non-tender; bowel sounds normal; no masses,  no organomegaly Extremities: edema 2+ Neurologic: Alert and oriented X 3, normal strength and tone. Normal symmetric reflexes. Normal coordination and gait  ECOG PERFORMANCE STATUS: 1 - Symptomatic but completely ambulatory  Blood pressure 164/82, pulse 77, temperature 97.6 F (36.4 C), temperature source Oral, resp. rate 18, height 5' 8.75" (1.746 m), weight 203 lb 14.4 oz (92.488 kg), SpO2 100 %.  LABORATORY DATA: Lab Results  Component Value Date   WBC 6.4 02/21/2015   HGB 11.8* 02/21/2015   HCT 37.3* 02/21/2015   MCV 102.0* 02/21/2015   PLT 188 02/21/2015      Chemistry      Component Value Date/Time   NA 141  02/21/2015 0959   NA 140 08/26/2014 1246   K 4.4 02/21/2015 0959   K 4.5 08/26/2014 1246   CL 100 08/26/2014 1246   CO2 25 02/21/2015 0959   CO2 29 08/26/2014 1246   BUN 27.4* 02/21/2015 0959   BUN 16 08/26/2014 1246   CREATININE 1.0 02/21/2015 0959   CREATININE 0.76 08/26/2014 1246   CREATININE 0.83 02/22/2014 1314      Component Value Date/Time   CALCIUM 8.8 02/21/2015 0959   CALCIUM 8.7 08/26/2014 1246   ALKPHOS 73 02/21/2015 0959   ALKPHOS 68 08/13/2014 1222   AST 21 02/21/2015 0959   AST 21 08/13/2014 1222   ALT 34 02/21/2015 0959   ALT 15 08/13/2014 1222   BILITOT 0.58 02/21/2015 0959   BILITOT 1.3* 08/13/2014 1222       RADIOGRAPHIC STUDIES: Dg Chest 2 View  02/15/2015   CLINICAL DATA:  Stage IV adenocarcinoma of the lung, PleurX catheter on the left, onset of shortness of breath today  EXAM: CHEST  2 VIEW  COMPARISON:  PA and lateral chest January 11, 2015  FINDINGS: The lungs are well-expanded. There is no pneumothorax or pneumomediastinum. A small left pleural effusion located posteriorly is stable. The left PleurX catheter tip lies just above the posterior costophrenic gutter. The cardiac silhouette is mildly enlarged though stable. The pulmonary vascularity is not engorged. The trachea is midline. The bony thorax exhibits no acute abnormality.  IMPRESSION: 1. There is a small left pleural effusion layering posteriorly little changed from the previous study. The PleurX catheter tip projects just above the posterior costophrenic gutter on the left. 2. The right lung is clear. There is stable mild enlargement of cardiac silhouette without pulmonary vascular congestion.   Electronically Signed   By: David  Martinique   On: 02/15/2015 10:19   ASSESSMENT AND PLAN: this is a very pleasant 74 years old white male with:  1) Stage IV non-small cell lung cancer, adenocarcinoma presenting with large right upper lobe lung mass in addition to mediastinal and bilateral hilar  lymphadenopathy as well as cervical lymphadenopathy and left pleural effusion. The patient completed a course systemic chemotherapy with carboplatin and Alimta status post 6 cycles. This was followed by 3 cycles of maintenance chemotherapy with single agent Alimta. The patient tolerated his treatment well. He had evidence for disease progression and the patient was started on treatment again  with carboplatin and Alimta, status post 5 cycles.patient was discussed with and also seen by Dr. [name]. His proximal muscle weakness may be related to previous courses of steroids versus deconditioning. Patient is asked to continue his exercise program and to monitor his leg weakness very closely. Overall is tolerating his treatment with carboplatin and Alimta relatively well. He will proceed with cycle #6 today as scheduled. He'll follow-up in 3 weeks with a restaging CT scan of the chest, abdomen and pelvis with contrast to reevaluate his disease.  2) Recurrent left pleural effusion: He is status post Pleurx catheter placement. Continue drainage of the pleural fluid 3 times a week as recommended by Dr. Prescott Gum   3) Atrial fibrillation: continue Eliquis.  4) lower extremity edema: Improved. He will continue Lasix on as-needed basis.  He was advised to call immediately if he has any concerning symptoms in the interval.  The patient voices understanding of current disease status and treatment options and is in agreement with the current care plan.  All questions were answered. The patient knows to call the clinic with any problems, questions or concerns. We can certainly see the patient much sooner if necessary.  Carlton Adam, PA-C 02/21/2015  ADDENDUM: Hematology/Oncology Attending:  I had a face to face encounter with the patient. I recommended his care plan. This is a very pleasant 74 years old white male with metastatic non-small cell lung cancer, adenocarcinoma currently undergoing systemic  chemotherapy with carboplatin and Alimta status post 5 cycles. The patient is tolerating his treatment fairly well with no significant adverse effect except for mild fatigue and proximal muscle weakness that could be related to previous treatment with a steroid versus deconditioning from his disease. I recommended for him to proceed with cycle #6 today as a scheduled. He would come back for follow-up visit in 3 weeks for reevaluation after repeating CT scan of the chest, abdomen and pelvis for restaging of his disease. He was advised to call immediately if he has any concerning symptoms in the interval.  Disclaimer: This note was dictated with voice recognition software. Similar sounding words can inadvertently be transcribed and may not be corrected upon review. Eilleen Kempf., MD 02/25/2015

## 2015-02-21 NOTE — Patient Instructions (Signed)
[facility] Discharge Instructions for Patients Receiving Chemotherapy  Today you received the following chemotherapy agents: Alimta and Carboplatin.  To help prevent nausea and vomiting after your treatment, we encourage you to take your nausea medication as prescribed.    If you develop nausea and vomiting that is not controlled by your nausea medication, call the clinic.   BELOW ARE SYMPTOMS THAT SHOULD BE REPORTED IMMEDIATELY:  *FEVER GREATER THAN 100.5 F  *CHILLS WITH OR WITHOUT FEVER  NAUSEA AND VOMITING THAT IS NOT CONTROLLED WITH YOUR NAUSEA MEDICATION  *UNUSUAL SHORTNESS OF BREATH  *UNUSUAL BRUISING OR BLEEDING  TENDERNESS IN MOUTH AND THROAT WITH OR WITHOUT PRESENCE OF ULCERS  *URINARY PROBLEMS  *BOWEL PROBLEMS  UNUSUAL RASH Items with * indicate a potential emergency and should be followed up as soon as possible.  Feel free to call the clinic you have any questions or concerns. The clinic phone number is [phone].

## 2015-02-22 ENCOUNTER — Telehealth: Payer: Self-pay | Admitting: *Deleted

## 2015-02-22 NOTE — Telephone Encounter (Signed)
Wife called ask if Shawn Espinoza should get his teeth cleaned and when.  He had an appointment later this week, was last treated yesterday.  Reviewed his labs and treatment plan and suggested wait until the day before his next treatment is due, which would be 03/13/15.  If he is unable to schedule that day, please wait until the day before the following scheduled treatment.

## 2015-02-24 ENCOUNTER — Encounter: Payer: Self-pay | Admitting: Family Medicine

## 2015-02-24 ENCOUNTER — Ambulatory Visit (INDEPENDENT_AMBULATORY_CARE_PROVIDER_SITE_OTHER): Payer: Medicare Other | Admitting: Family Medicine

## 2015-02-24 VITALS — BP 130/84 | HR 72 | Temp 98.1°F | Wt 207.5 lb

## 2015-02-24 DIAGNOSIS — H9191 Unspecified hearing loss, right ear: Secondary | ICD-10-CM

## 2015-02-24 DIAGNOSIS — J90 Pleural effusion, not elsewhere classified: Secondary | ICD-10-CM

## 2015-02-24 DIAGNOSIS — B370 Candidal stomatitis: Secondary | ICD-10-CM

## 2015-02-24 DIAGNOSIS — C3492 Malignant neoplasm of unspecified part of left bronchus or lung: Secondary | ICD-10-CM | POA: Diagnosis not present

## 2015-02-24 DIAGNOSIS — H9190 Unspecified hearing loss, unspecified ear: Secondary | ICD-10-CM | POA: Insufficient documentation

## 2015-02-24 MED ORDER — NYSTATIN 100000 UNIT/ML MT SUSP: 5.0000 mL | Freq: Three times a day (TID) | OROMUCOSAL | Status: DC

## 2015-02-24 NOTE — Assessment & Plan Note (Signed)
Treat with nystatin swish/swallow. Update if not improving.

## 2015-02-24 NOTE — Assessment & Plan Note (Signed)
Improving drainage amt. Continue once weekly drainage.

## 2015-02-24 NOTE — Assessment & Plan Note (Signed)
R>L. Canals clear, TMs overall normal. ?ototoxicity from carboplatin. Advised he notify onc next visit. Hearing screen today - normal

## 2015-02-24 NOTE — Progress Notes (Signed)
BP 130/84 mmHg  Pulse 72  Temp(Src) 98.1 F (36.7 C) (Oral)  Wt 207 lb 8 oz (94.121 kg)   CC: check ears  Subjective:    Patient ID: Shawn Espinoza, male    DOB: 1941-08-22, 74 y.o.   MRN: [id number]  HPI: Shawn Espinoza is a 74 y.o. male presenting on 02/24/2015 for [place] ears   Seen 02/21/2015 by onc for stage 4 L lung adenocarcinoma - on systemic chemo with carboplatin and alimta Q3 wks.   Persistent leg swelling, mild weight gain noted. Pleural effusion fluid drainage decreasing some. Down to once weekly pleurX cath drainage.   1 mo h/o trouble hearing R>L. Today actually hearing some better. Staying mildly dizzy. No significant sinus congestion.   Regularly gets sore throat after chemo.  Relevant past medical, surgical, family and social history reviewed and updated as indicated. Interim medical history since our last visit reviewed. Allergies and medications reviewed and updated. Current Outpatient Prescriptions on File Prior to Visit  Medication Sig  . albuterol (PROVENTIL HFA;VENTOLIN HFA) 108 (90 BASE) MCG/ACT inhaler Inhale 2 puffs into the lungs every 6 (six) hours as needed for wheezing or shortness of breath.  Marland Kitchen apixaban (ELIQUIS) 5 MG TABS tablet Take 1 tablet (5 mg total) by mouth 2 (two) times daily.  . 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 cetirizine (ZYRTEC) 10 MG tablet Take 10 mg by mouth daily as needed for allergies or rhinitis.   Marland Kitchen dexamethasone (DECADRON) 4 MG tablet 4 MG BY MOUTH TWICE A DAY THE DAY BEFORE, DAY OF AND DAY AFTER THE CHEMOTHERAPY EVERY 3 WEEKS. (Patient taking differently: 4 MG BY MOUTH TWICE A DAY THE DAY BEFORE, DAY OF AND DAY AFTER THE CHEMOTHERAPY AND THEN ONCE DAILY THEREAFTER.)  . folic acid (FOLVITE) 1 MG tablet TAKE 1 TABLET BY MOUTH EVERY DAY  . furosemide (LASIX) 40 MG tablet Take 1 tablet (40 mg total) by mouth daily.  Marland Kitchen glimepiride (AMARYL)  1 MG tablet Take 1 tablet (1 mg total) by mouth daily with breakfast. Watch for low sugars  . glucose blood test strip Use as instructed to check blood sugar once daily and as needed. 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 HYDROcodone-acetaminophen (NORCO/VICODIN) 5-325 MG per tablet Take 1 tablet by mouth every 6 (six) hours as needed for moderate pain.  . Lancets MISC Use to check sugar once daily and as needed Dx: J62.8 **ONE TOUCH DELICA**  . metoprolol tartrate (LOPRESSOR) 25 MG tablet Take 1 tablet (25 mg total) by mouth 2 (two) times daily.  . Multiple Vitamins-Minerals (ICAPS) CAPS Take by mouth 2 (two) times daily.  . polyethylene glycol (MIRALAX / GLYCOLAX) packet Take 8 g by mouth daily.  Marland Kitchen UNABLE TO FIND OTC potassium.  Unsure of dose.  Takes 1 tablet daily  . diphenoxylate-atropine (LOMOTIL) 2.5-0.025 MG per tablet Take 2 tablets by mouth 4 (four) times daily as needed for diarrhea or loose stools. (Patient not taking: Reported on 02/24/2015)  . ibuprofen (ADVIL,MOTRIN) 800 MG tablet Take 800 mg by mouth 2 (two) times daily as needed for moderate pain.  Marland Kitchen ketoconazole (NIZORAL) 2 % cream Apply 1 application topically 2 (two) times daily. (Patient not taking: Reported on 02/15/2015)  . methocarbamol (ROBAXIN) 500 MG tablet Take 500 mg by mouth every 8 (eight) hours as needed (pain).   . sucralfate (CARAFATE) 1 GM/10ML suspension Take 1 g by mouth  4 (four) times daily as needed (reflux).   No current facility-administered medications on file prior to visit.    Review of Systems Per HPI unless specifically indicated above     Objective:    BP 130/84 mmHg  Pulse 72  Temp(Src) 98.1 F (36.7 C) (Oral)  Wt 207 lb 8 oz (94.121 kg)  Wt Readings from Last 3 Encounters:  02/24/15 207 lb 8 oz (94.121 kg)  02/21/15 203 lb 14.4 oz (92.488 kg)  02/15/15 196 lb (88.905 kg)    Physical Exam  Constitutional: He appears well-developed and well-nourished. No distress.  Weight gain noted  HENT:  Right Ear: Hearing,  tympanic membrane, external ear and ear canal normal.  Left Ear: Hearing, tympanic membrane, external ear and ear canal normal.  Nose: No mucosal edema.  Mouth/Throat: Uvula is midline. Posterior oropharyngeal edema and posterior oropharyngeal erythema present. No oropharyngeal exudate or tonsillar abscesses.  Pale turbinates White plaques posterior oropharynx  Neck: Normal range of motion. Neck supple.  Cardiovascular: Normal rate, regular rhythm, normal heart sounds and intact distal pulses.   No murmur heard. Regular rhythm today.  Pulmonary/Chest: Effort normal and breath sounds normal. No respiratory distress. He has no wheezes. He has no rales.  Musculoskeletal: He exhibits edema (2+ pedal edema).  Lymphadenopathy:    He has no cervical adenopathy.  Skin: Skin is warm and dry. No rash noted.  Psychiatric: He has a normal mood and affect.  Nursing note and vitals reviewed.  Results for orders placed or performed in visit on 02/21/15  TECHNOLOGIST REVIEW  Result Value Ref Range   Technologist Review 2% myelocytes   CBC with Differential  Result Value Ref Range   WBC 6.4 4.0 - 10.3 10e3/uL   NEUT# 5.4 1.5 - 6.5 10e3/uL   HGB 11.8 (L) 13.0 - 17.1 g/dL   HCT 37.3 (L) 38.4 - 49.9 %   Platelets 188 140 - 400 10e3/uL   MCV 102.0 (H) 79.3 - 98.0 fL   MCH 32.4 27.2 - 33.4 pg   MCHC 31.8 (L) 32.0 - 36.0 g/dL   RBC 3.66 (L) 4.20 - 5.82 10e6/uL   RDW 21.9 (H) 11.0 - 14.6 %   lymph# 0.4 (L) 0.9 - 3.3 10e3/uL   MONO# 0.5 0.1 - 0.9 10e3/uL   Eosinophils Absolute 0.0 0.0 - 0.5 10e3/uL   Basophils Absolute 0.1 0.0 - 0.1 10e3/uL   NEUT% 84.0 (H) 39.0 - 75.0 %   LYMPH% 6.4 (L) 14.0 - 49.0 %   MONO% 8.4 0.0 - 14.0 %   EOS% 0.2 0.0 - 7.0 %   BASO% 1.0 0.0 - 2.0 %  Comprehensive metabolic panel  Result Value Ref Range   Sodium 141 136 - 145 mEq/L   Potassium 4.4 3.5 - 5.1 mEq/L   Chloride 102 98 - 109 mEq/L   CO2 25 22 - 29 mEq/L   Glucose 251 (H) 70 - 140 mg/dl   BUN 27.4 (H) 7.0 -  26.0 mg/dL   Creatinine 1.0 0.7 - 1.3 mg/dL   Total Bilirubin 0.58 0.20 - 1.20 mg/dL   Alkaline Phosphatase 73 40 - 150 U/L   AST 21 5 - 34 U/L   ALT 34 0 - 55 U/L   Total Protein 6.0 (L) 6.4 - 8.3 g/dL   Albumin 3.5 3.5 - 5.0 g/dL   Calcium 8.8 8.4 - 10.4 mg/dL   Anion Gap 14 (H) 3 - 11 mEq/L   EGFR 77 (L) >90 ml/min/1.73 m2  Lab Results  Component Value Date   HGBA1C 7.3* 01/19/2015      Assessment & Plan:   Problem List Items Addressed This Visit    Thrush    Treat with nystatin swish/swallow. Update if not improving.      Relevant Medications   nystatin (MYCOSTATIN) 100000 UNIT/ML suspension   Recurrent left pleural effusion    Improving drainage amt. Continue once weekly drainage.      Hearing loss - Primary    R>L. Canals clear, TMs overall normal. ?ototoxicity from carboplatin. Advised he notify onc next visit. Hearing screen today - normal      Adenocarcinoma of left lung, stage 4    Continues Q3 wk systemic chemo with carboplatin and alimta. Regular f/u with onc.          Follow up plan: Return if symptoms worsen or fail to improve.

## 2015-02-24 NOTE — Patient Instructions (Signed)
Continue labs as scheduled Follow up in 3 weeks with a restaging CT scan of your chest, abdomen and pelvis to re-evaluate your disease

## 2015-02-24 NOTE — Patient Instructions (Addendum)
Ears are looking ok today. Hearing screen done today. I think you have thrush - treat nystatin swish and swallow solution. No other changes for now.  Let oncology know about hearing next visit.

## 2015-02-24 NOTE — Progress Notes (Signed)
Pre visit review using our clinic review tool, if applicable. No additional management support is needed unless otherwise documented below in the visit note. 

## 2015-02-24 NOTE — Assessment & Plan Note (Signed)
Continues Q3 wk systemic chemo with carboplatin and alimta. Regular f/u with onc.

## 2015-02-28 ENCOUNTER — Telehealth: Payer: Self-pay

## 2015-02-28 ENCOUNTER — Other Ambulatory Visit: Payer: Self-pay | Admitting: Internal Medicine

## 2015-02-28 ENCOUNTER — Ambulatory Visit: Payer: Medicare Other | Admitting: Family Medicine

## 2015-02-28 NOTE — Telephone Encounter (Signed)
Kittie left v/m; pt seen 02/24/15 and nystatin suspension has not helped thrush and Kittie wants to know if pt could try Magic mouthwash that pt already has at home. Kittie request cb.

## 2015-02-28 NOTE — Telephone Encounter (Signed)
Left voicemail for a call back.

## 2015-02-28 NOTE — Telephone Encounter (Signed)
Ok to try magic mouthwash. Update [place] with effect.

## 2015-02-28 NOTE — Telephone Encounter (Signed)
Called and spoken to Kandra Nicolas (patient's wife) and let her know that Dr Danise Mina said ok to try the mouthwash. Will update [place] how he is doing.

## 2015-03-01 ENCOUNTER — Other Ambulatory Visit (HOSPITAL_BASED_OUTPATIENT_CLINIC_OR_DEPARTMENT_OTHER): Payer: Medicare Other

## 2015-03-01 DIAGNOSIS — C3412 Malignant neoplasm of upper lobe, left bronchus or lung: Secondary | ICD-10-CM

## 2015-03-01 DIAGNOSIS — C3492 Malignant neoplasm of unspecified part of left bronchus or lung: Secondary | ICD-10-CM

## 2015-03-01 LAB — COMPREHENSIVE METABOLIC PANEL (CC13)
ALK PHOS: 64 U/L (ref 40–150)
ALT: 36 U/L (ref 0–55)
AST: 22 U/L (ref 5–34)
Albumin: 2.9 g/dL — ABNORMAL LOW (ref 3.5–5.0)
Anion Gap: 10 mEq/L (ref 3–11)
BUN: 22.5 mg/dL (ref 7.0–26.0)
CALCIUM: 8.1 mg/dL — AB (ref 8.4–10.4)
CO2: 26 mEq/L (ref 22–29)
Chloride: 101 mEq/L (ref 98–109)
Creatinine: 0.8 mg/dL (ref 0.7–1.3)
EGFR: 86 mL/min/{1.73_m2} — ABNORMAL LOW (ref >90)
Glucose: 359 mg/dl — ABNORMAL HIGH (ref 70–140)
POTASSIUM: 4.2 meq/L (ref 3.5–5.1)
SODIUM: 137 meq/L (ref 136–145)
Total Bilirubin: 1.17 mg/dL (ref 0.20–1.20)
Total Protein: 5.4 g/dL — ABNORMAL LOW (ref 6.4–8.3)

## 2015-03-01 LAB — CBC WITH DIFFERENTIAL/PLATELET
BASO%: 0.5 % (ref 0.0–2.0)
Basophils Absolute: 0 10*3/uL (ref 0.0–0.1)
EOS ABS: 0 10*3/uL (ref 0.0–0.5)
EOS%: 0.5 % (ref 0.0–7.0)
HCT: 33.2 % — ABNORMAL LOW (ref 38.4–49.9)
HGB: 10.7 g/dL — ABNORMAL LOW (ref 13.0–17.1)
LYMPH#: 0.3 10*3/uL — AB (ref 0.9–3.3)
LYMPH%: 16.5 % (ref 14.0–49.0)
MCH: 32.4 pg (ref 27.2–33.4)
MCHC: 32.2 g/dL (ref 32.0–36.0)
MCV: 100.6 fL — AB (ref 79.3–98.0)
MONO#: 0.4 10*3/uL (ref 0.1–0.9)
MONO%: 21.4 % — AB (ref 0.0–14.0)
NEUT#: 1.1 10*3/uL — ABNORMAL LOW (ref 1.5–6.5)
NEUT%: 61.1 % (ref 39.0–75.0)
Platelets: 66 10*3/uL — ABNORMAL LOW (ref 140–400)
RBC: 3.3 10*6/uL — AB (ref 4.20–5.82)
RDW: 17.3 % — ABNORMAL HIGH (ref 11.0–14.6)
WBC: 1.8 10*3/uL — ABNORMAL LOW (ref 4.0–10.3)

## 2015-03-03 ENCOUNTER — Telehealth: Payer: Self-pay | Admitting: Internal Medicine

## 2015-03-03 ENCOUNTER — Other Ambulatory Visit: Payer: Self-pay | Admitting: *Deleted

## 2015-03-03 ENCOUNTER — Telehealth: Payer: Self-pay | Admitting: *Deleted

## 2015-03-03 DIAGNOSIS — Z85118 Personal history of other malignant neoplasm of bronchus and lung: Secondary | ICD-10-CM

## 2015-03-03 MED ORDER — MAGIC MOUTHWASH W/LIDOCAINE: 5.0000 mL | Freq: Three times a day (TID) | ORAL | Status: DC | PRN

## 2015-03-03 NOTE — Telephone Encounter (Signed)
pt called to sched iv access for ct....done

## 2015-03-03 NOTE — Telephone Encounter (Signed)
Refilled patient Magic mouthwash prescription. RN called patient to notify. No answer/busy.

## 2015-03-08 ENCOUNTER — Other Ambulatory Visit (HOSPITAL_BASED_OUTPATIENT_CLINIC_OR_DEPARTMENT_OTHER): Payer: Medicare Other

## 2015-03-08 DIAGNOSIS — C3412 Malignant neoplasm of upper lobe, left bronchus or lung: Secondary | ICD-10-CM

## 2015-03-08 DIAGNOSIS — C3492 Malignant neoplasm of unspecified part of left bronchus or lung: Secondary | ICD-10-CM

## 2015-03-08 LAB — CBC WITH DIFFERENTIAL/PLATELET
BASO%: 0 % (ref 0.0–2.0)
Basophils Absolute: 0 10*3/uL (ref 0.0–0.1)
EOS ABS: 0 10*3/uL (ref 0.0–0.5)
EOS%: 0.4 % (ref 0.0–7.0)
HEMATOCRIT: 28.8 % — AB (ref 38.4–49.9)
HGB: 9.3 g/dL — ABNORMAL LOW (ref 13.0–17.1)
LYMPH%: 18.1 % (ref 14.0–49.0)
MCH: 31.5 pg (ref 27.2–33.4)
MCHC: 32.3 g/dL (ref 32.0–36.0)
MCV: 97.6 fL (ref 79.3–98.0)
MONO#: 0.4 10*3/uL (ref 0.1–0.9)
MONO%: 14.1 % — AB (ref 0.0–14.0)
NEUT#: 1.7 10*3/uL (ref 1.5–6.5)
NEUT%: 67.4 % (ref 39.0–75.0)
PLATELETS: 102 10*3/uL — AB (ref 140–400)
RBC: 2.95 10*6/uL — AB (ref 4.20–5.82)
RDW: 18 % — ABNORMAL HIGH (ref 11.0–14.6)
WBC: 2.5 10*3/uL — ABNORMAL LOW (ref 4.0–10.3)
lymph#: 0.5 10*3/uL — ABNORMAL LOW (ref 0.9–3.3)

## 2015-03-08 LAB — COMPREHENSIVE METABOLIC PANEL (CC13)
ALT: 24 U/L (ref 0–55)
AST: 22 U/L (ref 5–34)
Albumin: 2.9 g/dL — ABNORMAL LOW (ref 3.5–5.0)
Alkaline Phosphatase: 75 U/L (ref 40–150)
Anion Gap: 10 mEq/L (ref 3–11)
BUN: 11.5 mg/dL (ref 7.0–26.0)
CO2: 28 mEq/L (ref 22–29)
Calcium: 8.4 mg/dL (ref 8.4–10.4)
Chloride: 100 mEq/L (ref 98–109)
Creatinine: 1 mg/dL (ref 0.7–1.3)
EGFR: 75 mL/min/{1.73_m2} — ABNORMAL LOW (ref >90)
Glucose: 202 mg/dl — ABNORMAL HIGH (ref 70–140)
Potassium: 4.1 mEq/L (ref 3.5–5.1)
Sodium: 138 mEq/L (ref 136–145)
Total Bilirubin: 1.11 mg/dL (ref 0.20–1.20)
Total Protein: 6 g/dL — ABNORMAL LOW (ref 6.4–8.3)

## 2015-03-09 ENCOUNTER — Other Ambulatory Visit: Payer: Self-pay | Admitting: *Deleted

## 2015-03-10 ENCOUNTER — Ambulatory Visit (HOSPITAL_BASED_OUTPATIENT_CLINIC_OR_DEPARTMENT_OTHER): Payer: Medicare Other

## 2015-03-10 ENCOUNTER — Encounter (HOSPITAL_COMMUNITY): Payer: Self-pay

## 2015-03-10 ENCOUNTER — Ambulatory Visit (HOSPITAL_COMMUNITY)
Admission: RE | Admit: 2015-03-10 | Discharge: 2015-03-10 | Disposition: A | Discharge status: Home / Self Care | Payer: Medicare Other | Source: Ambulatory Visit | Attending: Physician Assistant | Admitting: Physician Assistant

## 2015-03-10 DIAGNOSIS — C3492 Malignant neoplasm of unspecified part of left bronchus or lung: Secondary | ICD-10-CM

## 2015-03-10 DIAGNOSIS — C3412 Malignant neoplasm of upper lobe, left bronchus or lung: Secondary | ICD-10-CM

## 2015-03-10 DIAGNOSIS — Z79899 Other long term (current) drug therapy: Secondary | ICD-10-CM | POA: Diagnosis not present

## 2015-03-10 DIAGNOSIS — C3490 Malignant neoplasm of unspecified part of unspecified bronchus or lung: Secondary | ICD-10-CM | POA: Insufficient documentation

## 2015-03-10 DIAGNOSIS — R0602 Shortness of breath: Secondary | ICD-10-CM | POA: Insufficient documentation

## 2015-03-10 DIAGNOSIS — R079 Chest pain, unspecified: Secondary | ICD-10-CM | POA: Insufficient documentation

## 2015-03-10 DIAGNOSIS — R05 Cough: Secondary | ICD-10-CM | POA: Insufficient documentation

## 2015-03-10 MED ORDER — IOHEXOL 300 MG/ML  SOLN
100.0000 mL | Freq: Once | INTRAMUSCULAR | Status: AC | PRN
  Administered 2015-03-10: 100 mL via INTRAVENOUS

## 2015-03-13 ENCOUNTER — Other Ambulatory Visit: Payer: Self-pay | Admitting: Cardiothoracic Surgery

## 2015-03-13 DIAGNOSIS — C3492 Malignant neoplasm of unspecified part of left bronchus or lung: Secondary | ICD-10-CM

## 2015-03-14 ENCOUNTER — Ambulatory Visit: Payer: Medicare Other

## 2015-03-14 ENCOUNTER — Telehealth: Payer: Self-pay | Admitting: Internal Medicine

## 2015-03-14 ENCOUNTER — Ambulatory Visit (HOSPITAL_BASED_OUTPATIENT_CLINIC_OR_DEPARTMENT_OTHER): Payer: Medicare Other

## 2015-03-14 ENCOUNTER — Other Ambulatory Visit (HOSPITAL_BASED_OUTPATIENT_CLINIC_OR_DEPARTMENT_OTHER): Payer: Medicare Other

## 2015-03-14 ENCOUNTER — Ambulatory Visit (HOSPITAL_BASED_OUTPATIENT_CLINIC_OR_DEPARTMENT_OTHER): Payer: Medicare Other | Admitting: Nurse Practitioner

## 2015-03-14 VITALS — BP 144/81 | HR 95 | Temp 97.6°F | Resp 24 | Ht 68.75 in | Wt 209.0 lb

## 2015-03-14 DIAGNOSIS — J90 Pleural effusion, not elsewhere classified: Secondary | ICD-10-CM | POA: Diagnosis not present

## 2015-03-14 DIAGNOSIS — C3492 Malignant neoplasm of unspecified part of left bronchus or lung: Secondary | ICD-10-CM

## 2015-03-14 DIAGNOSIS — C3412 Malignant neoplasm of upper lobe, left bronchus or lung: Secondary | ICD-10-CM

## 2015-03-14 DIAGNOSIS — R160 Hepatomegaly, not elsewhere classified: Secondary | ICD-10-CM | POA: Diagnosis not present

## 2015-03-14 DIAGNOSIS — R0602 Shortness of breath: Secondary | ICD-10-CM

## 2015-03-14 DIAGNOSIS — Z5111 Encounter for antineoplastic chemotherapy: Secondary | ICD-10-CM

## 2015-03-14 DIAGNOSIS — R530 Neoplastic (malignant) related fatigue: Secondary | ICD-10-CM

## 2015-03-14 LAB — COMPREHENSIVE METABOLIC PANEL (CC13)
ALT: 23 U/L (ref 0–55)
ANION GAP: 14 meq/L — AB (ref 3–11)
AST: 18 U/L (ref 5–34)
Albumin: 3 g/dL — ABNORMAL LOW (ref 3.5–5.0)
Alkaline Phosphatase: 79 U/L (ref 40–150)
BUN: 15.6 mg/dL (ref 7.0–26.0)
CALCIUM: 9 mg/dL (ref 8.4–10.4)
CHLORIDE: 102 meq/L (ref 98–109)
CO2: 25 meq/L (ref 22–29)
Creatinine: 1 mg/dL (ref 0.7–1.3)
EGFR: 70 mL/min/{1.73_m2} — ABNORMAL LOW (ref >90)
Glucose: 345 mg/dl — ABNORMAL HIGH (ref 70–140)
Potassium: 4.9 mEq/L (ref 3.5–5.1)
Sodium: 140 mEq/L (ref 136–145)
Total Bilirubin: 0.68 mg/dL (ref 0.20–1.20)
Total Protein: 6.4 g/dL (ref 6.4–8.3)

## 2015-03-14 LAB — CBC WITH DIFFERENTIAL/PLATELET
BASO%: 0 % (ref 0.0–2.0)
BASOS ABS: 0 10*3/uL (ref 0.0–0.1)
EOS ABS: 0 10*3/uL (ref 0.0–0.5)
EOS%: 0.2 % (ref 0.0–7.0)
HEMATOCRIT: 31.4 % — AB (ref 38.4–49.9)
HEMOGLOBIN: 9.7 g/dL — AB (ref 13.0–17.1)
LYMPH#: 0.4 10*3/uL — AB (ref 0.9–3.3)
LYMPH%: 7.9 % — ABNORMAL LOW (ref 14.0–49.0)
MCH: 30.5 pg (ref 27.2–33.4)
MCHC: 30.9 g/dL — ABNORMAL LOW (ref 32.0–36.0)
MCV: 98.7 fL — ABNORMAL HIGH (ref 79.3–98.0)
MONO#: 0.4 10*3/uL (ref 0.1–0.9)
MONO%: 7.9 % (ref 0.0–14.0)
NEUT#: 4.7 10*3/uL (ref 1.5–6.5)
NEUT%: 84 % — ABNORMAL HIGH (ref 39.0–75.0)
PLATELETS: 297 10*3/uL (ref 140–400)
RBC: 3.18 10*6/uL — ABNORMAL LOW (ref 4.20–5.82)
RDW: 17.9 % — ABNORMAL HIGH (ref 11.0–14.6)
WBC: 5.6 10*3/uL (ref 4.0–10.3)

## 2015-03-14 MED ORDER — SODIUM CHLORIDE 0.9 % IV SOLN
470.0000 mg/m2 | Freq: Once | INTRAVENOUS | Status: AC
  Administered 2015-03-14: 1000 mg via INTRAVENOUS
  Filled 2015-03-14: qty 40

## 2015-03-14 MED ORDER — SODIUM CHLORIDE 0.9 % IV SOLN
Freq: Once | INTRAVENOUS | Status: AC
  Administered 2015-03-14: 14:00:00 via INTRAVENOUS

## 2015-03-14 MED ORDER — SODIUM CHLORIDE 0.9 % IV SOLN
Freq: Once | INTRAVENOUS | Status: AC
  Administered 2015-03-14: 14:00:00 via INTRAVENOUS
  Filled 2015-03-14: qty 4

## 2015-03-14 NOTE — Progress Notes (Addendum)
[facility] OFFICE PROGRESS NOTE   Diagnosis: Stage IIIB/IV non-small cell lung cancer, adenocarcinoma diagnosed in March of 2015.  PRIOR THERAPY:  1) Systemic chemotherapy with carboplatin for AUC of 5 and Alimta 500 mg/M2 every 3 weeks. First dose 04/13/2014. Status post 6 cycles. 2) Maintenance chemotherapy with single agent Alimta 500 mg/M2 every 3 weeks. First cycle on 09/06/2014. Status post 1 cycle.  CURRENT THERAPY: Second course of systemic chemotherapy with carboplatin for AUC of 5 and Alimta 500 MG/M2 every 3 weeks. First cycle 11/08/2014. Status post 6 cycles.    INTERVAL HISTORY:   Shawn Espinoza returns as scheduled. He completed cycle 6 carboplatin/Alimta on 02/21/2015. He denies nausea/vomiting. He notes mouth sensitivity following chemotherapy. No discrete ulcers. He notes improvement with Magic mouthwash. He notes increased constipation following chemotherapy. He has a laxative regimen that he follows. He continues to have a good appetite. He has stable dyspnea on exertion. He continues routine drainage of the Pleurx catheter. He has stable  swelling of his feet. No improvement with Lasix. He is interested in a Port-A-Cath and plans to discuss this with Dr. Nils Pyle at an upcoming appointment.  Objective:  Vital signs in last 24 hours:  Blood pressure 144/81, pulse 95, temperature 97.6 F (36.4 C), temperature source Oral, resp. rate 24, height 5' 8.75" (1.746 m), weight 209 lb (94.802 kg), SpO2 100 %.    HEENT: No thrush or ulcers. Lymphatics: No palpable cervical or supra-clavicular lymph nodes. Resp: Lungs clear bilaterally. Pleurx catheter site is bandaged. Cardio: Regular rate and rhythm. GI: Abdomen is soft. No hepatomegaly. Vascular: 1+ pitting edema at the lower legs bilaterally. Chronic venous stasis changes.  Skin: Question skin tear left lateral abdomen.    Lab Results:  Lab Results  Component Value Date   WBC 5.6 03/14/2015   HGB  9.7* 03/14/2015   HCT 31.4* 03/14/2015   MCV 98.7* 03/14/2015   PLT 297 03/14/2015   NEUTROABS 4.7 03/14/2015    Imaging:  No results found.  Medications: I have reviewed the patient's current medications.  Assessment/Plan: 1. Metastatic non-small cell lung cancer, adenocarcinoma, currently undergoing systemic chemotherapy with carboplatin and Alimta status post 6 cycles. Restaging CT evaluation 03/10/2015 showed the right hepatic lobe mass measured 5.8 x 4.9 cm compared to 5.6 x 4.7 cm on the previous scan. Other smaller satellite nodules around the larger lesion were stable. No new hepatic lesions. No evidence of recurrent disease in the left lung. Small left pleural effusion decreased in size with pleural drainage catheter in position. 2. Recurrent left pleural effusion. He has a Pleurx catheter. He continues follow-up with Dr. Nils Pyle. 3. Venous access. He will discuss placement of a Port-A-Cath with Dr. Nils Pyle.   Disposition: Shawn Espinoza has completed 6 cycles of carboplatin/Alimta. Recent restaging CT scan was overall stable. Dr. [name] recommends initiation of maintenance Alimta every 3 weeks with the first treatment today.  He will return for a follow-up visit and cycle 2 maintenance Alimta in 3 weeks. He will contact the office in the interim with any problems.  Patient seen with Dr. [name].  Ned Card ANP/GNP-BC   03/14/2015  12:45 PM  ADDENDUM: Hematology/Oncology Attending: I had a face to face encounter with the patient. I recommended his care plan. This is a very pleasant 74 years old white male with metastatic non-small cell lung cancer, adenocarcinoma status post second course of systemic chemotherapy with carboplatin and Alimta recently completed 6 cycles of this treatment. The  patient is feeling fine except for the persistent baseline shortness of breath. His recent CT scan of the chest, abdomen and pelvis showed no significant disease progression except  for mildly increased right hepatic lobe mass by few millimeters. I discussed the scan results with the patient and his wife. I recommended for him to consider maintenance chemotherapy with single agent Alimta. The patient is interested in proceeding with the treatment and he is scheduled to receive the first cycle today. He would come back for follow-up visit in 3 weeks with the start of cycle #2. For the recurrent left pleural effusion, the patient will continue drainage via the Pleurx catheter. He is also interested in having a Port-A-Cath placed for IV axis. He will discuss this with Dr. Prescott Gum when he sees him next week.  He was advised to call immediately if he has any concerning symptoms in the interval.  Disclaimer: This note was dictated with voice recognition software. Similar sounding words can inadvertently be transcribed and may be missed upon review. Shawn Espinoza., MD 03/14/2015

## 2015-03-14 NOTE — Patient Instructions (Signed)
[facility] Discharge Instructions for Patients Receiving Chemotherapy  Today you received the following chemotherapy agents; Alimta.   To help prevent nausea and vomiting after your treatment, we encourage you to take your nausea medication as directed.    If you develop nausea and vomiting that is not controlled by your nausea medication, call the clinic.   BELOW ARE SYMPTOMS THAT SHOULD BE REPORTED IMMEDIATELY:  *FEVER GREATER THAN 100.5 F  *CHILLS WITH OR WITHOUT FEVER  NAUSEA AND VOMITING THAT IS NOT CONTROLLED WITH YOUR NAUSEA MEDICATION  *UNUSUAL SHORTNESS OF BREATH  *UNUSUAL BRUISING OR BLEEDING  TENDERNESS IN MOUTH AND THROAT WITH OR WITHOUT PRESENCE OF ULCERS  *URINARY PROBLEMS  *BOWEL PROBLEMS  UNUSUAL RASH Items with * indicate a potential emergency and should be followed up as soon as possible.  Feel free to call the clinic you have any questions or concerns. The clinic phone number is [phone].  Please show the CHEMO ALERT CARD at check-in to the Emergency Department and triage nurse.   

## 2015-03-14 NOTE — Telephone Encounter (Signed)
gave and printed appt sched and avs for pt for March April adn May....sed added tx.

## 2015-03-15 ENCOUNTER — Ambulatory Visit
Admission: RE | Admit: 2015-03-15 | Discharge: 2015-03-15 | Disposition: A | Discharge status: Home / Self Care | Payer: Medicare Other | Source: Ambulatory Visit | Attending: Cardiothoracic Surgery | Admitting: Cardiothoracic Surgery

## 2015-03-15 ENCOUNTER — Telehealth: Payer: Self-pay

## 2015-03-15 ENCOUNTER — Encounter: Payer: Self-pay | Admitting: Cardiothoracic Surgery

## 2015-03-15 ENCOUNTER — Other Ambulatory Visit: Payer: Self-pay | Admitting: *Deleted

## 2015-03-15 ENCOUNTER — Ambulatory Visit (INDEPENDENT_AMBULATORY_CARE_PROVIDER_SITE_OTHER): Payer: Medicare Other | Admitting: Cardiothoracic Surgery

## 2015-03-15 VITALS — BP 149/85 | HR 98 | Resp 20 | Ht 68.75 in | Wt 195.0 lb

## 2015-03-15 DIAGNOSIS — R0602 Shortness of breath: Secondary | ICD-10-CM

## 2015-03-15 DIAGNOSIS — J910 Malignant pleural effusion: Secondary | ICD-10-CM | POA: Diagnosis not present

## 2015-03-15 DIAGNOSIS — C3490 Malignant neoplasm of unspecified part of unspecified bronchus or lung: Secondary | ICD-10-CM

## 2015-03-15 DIAGNOSIS — Z4682 Encounter for fitting and adjustment of non-vascular catheter: Secondary | ICD-10-CM | POA: Diagnosis not present

## 2015-03-15 DIAGNOSIS — C3492 Malignant neoplasm of unspecified part of left bronchus or lung: Secondary | ICD-10-CM

## 2015-03-15 DIAGNOSIS — J90 Pleural effusion, not elsewhere classified: Secondary | ICD-10-CM | POA: Diagnosis not present

## 2015-03-15 DIAGNOSIS — J929 Pleural plaque without asbestos: Secondary | ICD-10-CM | POA: Diagnosis not present

## 2015-03-15 NOTE — Telephone Encounter (Signed)
Left message with pt's wife that the last dose of Eliquis will be April 6th 2016 per Dr Prescott Gum.

## 2015-03-15 NOTE — Progress Notes (Signed)
PCP is [name], MD Referring Provider is Curt Bears, MD  Chief Complaint  Patient presents with  . Follow-up    4 wk with a cxr..draining L pleurX once a week...60-350CC    HPI:the patient returns for routine Pleurx catheter check. He is a recurrent left malignant effusion from history of advanced stage lung cancer. The Pleurx catheter has had minimal drainage for 2 weeks. The last significant drainage of 300 cc was March 9. The patient is receiving maintenance chemotherapy from Dr. [name]. The patient is short of breath with almost any exertion. His chest x-ray shows no significant pleural effusion. His last CT scan shows no significant pericardial effusion. His last echocardiogram 8 months ago showed normal LV function.  The patient has had troubles with basher access for his chemotherapy and a Port-A-Cath has been requested. This will be scheduled for April 8 at [facility]. At the same time we will remove the left Pleurx catheter if it remains to have insignificant drainage. He will stop the Eliquis 48 hours prior to surgery. Past Medical History  Diagnosis Date  . History of diabetes mellitus, type II     resolved with diet, h/o neuropathy  . Diverticulosis of colon   . Hyperlipidemia   . Hypertension   . Fatty liver 02/29/00    abd ultrasound  . Lower back pain   . Systolic murmur 5053    2Decho - normal LV fxn, EF 55%, mild AS, biatrial enlargement  . History of tobacco use quit 1990s  . 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 Personal history of colonic adenomas 07/06/2013  . Hypertensive retinopathy of both eyes 2015    mild  . 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 Dysrhythmia     Atrial fib (found 07/2014)  . Shortness of breath   . Pneumonia   . GERD (gastroesophageal reflux disease)     occasional  .  Arthritis   . Neuropathy   . Constipation   . 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 Diabetes mellitus without complication     Past Surgical History  Procedure Laterality Date  . Colonoscopy  2004  . Colonoscopy  2014    tubular adenoma x1, mod diverticulosis ([place])  . Flexible bronchoscopy  02/2014    WNL  . Video bronchoscopy Bilateral 03/17/2014    Procedure: VIDEO BRONCHOSCOPY WITH FLUORO;  Surgeon: Tanda Rockers, MD;  Location: [facility];  Service: Cardiopulmonary;  Laterality: Bilateral;  . Chest tube insertion Left 08/26/2014    Procedure: INSERTION OF LEFT  PLEURAL DRAINAGE CATHETER;  Surgeon: Ivin Poot, MD;  Location: [facility] OR;  Service: Thoracic;  Laterality: Left;    Family History  Problem Relation Age of Onset  . Stroke Mother     multiple mini strokes  . Hypertension Mother   . Heart disease Maternal Grandfather     MI  . Stroke Paternal Grandmother   . Colon cancer Neg Hx     Social History History  Substance Use Topics  . Smoking status: Former Smoker -- 3.00 packs/day for 30 years    Types: Cigarettes    Quit date: 12/23/1988  . Smokeless tobacco: Never Used  . Alcohol Use: 12.6 oz/week    21 Glasses of wine per  week     Comment: 3-4 wine a day ( 8/03- 2 beers, 2 wines, 2 brandies daily)    Current Outpatient Prescriptions  Medication Sig Dispense Refill  . albuterol (PROVENTIL HFA;VENTOLIN HFA) 108 (90 BASE) MCG/ACT inhaler Inhale 2 puffs into the lungs every 6 (six) hours as needed for wheezing or shortness of breath. 1 Inhaler 0  . Alum & Mag Hydroxide-Simeth (MAGIC MOUTHWASH W/LIDOCAINE) SOLN Take 5 mLs by mouth 3 (three) times daily as needed for mouth pain. 5 mL 0  . apixaban (ELIQUIS) 5 MG TABS tablet Take 1 tablet (5 mg total) by mouth 2 (two) times daily. 60 tablet 6  . Blood Glucose Monitoring Suppl (ONE TOUCH ULTRA SYSTEM KIT) W/DEVICE KIT Use as directed to check blood sugar Dx: E09.9 1 each 0  .  cetirizine (ZYRTEC) 10 MG tablet Take 10 mg by mouth daily as needed for allergies or rhinitis.     Marland Kitchen dexamethasone (DECADRON) 4 MG tablet 4 MG BY MOUTH TWICE A DAY THE DAY BEFORE, DAY OF AND DAY AFTER THE CHEMOTHERAPY EVERY 3 WEEKS. (Patient taking differently: 4 MG BY MOUTH TWICE A DAY THE DAY BEFORE, DAY OF AND DAY AFTER THE CHEMOTHERAPY AND THEN ONCE DAILY THEREAFTER.) 40 tablet 1  . diphenoxylate-atropine (LOMOTIL) 2.5-0.025 MG per tablet Take 2 tablets by mouth 4 (four) times daily as needed for diarrhea or loose stools. (Patient not taking: Reported on 02/24/2015) 30 tablet 0  . folic acid (FOLVITE) 1 MG tablet TAKE 1 TABLET BY MOUTH EVERY DAY 30 tablet 4  . furosemide (LASIX) 40 MG tablet Take 1 tablet (40 mg total) by mouth daily. 30 tablet 6  . glimepiride (AMARYL) 1 MG tablet Take 1 tablet (1 mg total) by mouth daily with breakfast. Watch for low sugars 30 tablet 11  . glucose blood test strip Use as instructed to check blood sugar once daily and as needed. Dx: E09.9 200 each 3  . HYDROcodone-acetaminophen (NORCO/VICODIN) 5-325 MG per tablet Take 1 tablet by mouth every 6 (six) hours as needed for moderate pain.    Marland Kitchen ibuprofen (ADVIL,MOTRIN) 800 MG tablet Take 800 mg by mouth 2 (two) times daily as needed for moderate pain.    Marland Kitchen ketoconazole (NIZORAL) 2 % cream Apply 1 application topically 2 (two) times daily. 60 g 1  . Lancets MISC Use to check sugar once daily and as needed Dx: Z12.4 **ONE TOUCH DELICA** 580 each 3  . methocarbamol (ROBAXIN) 500 MG tablet Take 500 mg by mouth every 8 (eight) hours as needed (pain).     . metoprolol tartrate (LOPRESSOR) 25 MG tablet Take 1 tablet (25 mg total) by mouth 2 (two) times daily. 60 tablet 6  . Multiple Vitamins-Minerals (ICAPS) CAPS Take by mouth 2 (two) times daily.    Marland Kitchen nystatin (MYCOSTATIN) 100000 UNIT/ML suspension Take 5 mLs (500,000 Units total) by mouth 3 (three) times daily. 120 mL 0  . polyethylene glycol (MIRALAX / GLYCOLAX) packet Take  8 g by mouth daily.    . sucralfate (CARAFATE) 1 GM/10ML suspension Take 1 g by mouth 4 (four) times daily as needed (reflux).    Marland Kitchen UNABLE TO FIND OTC potassium.  Unsure of dose.  Takes 1 tablet daily     No current facility-administered medications for this visit.    Allergies  Allergen Reactions  . Candesartan Cilexetil     REACTION: Muscle spasms  . Diltiazem Hcl     REACTION: Dizziness  . Doxazosin Mesylate Other (See  Comments)    REACTION: H/A's  . Nifedipine     REACTION: Leg swelling  . Pravastatin Other (See Comments)    Muscle aches  . Red Yeast Rice     Muscle aches  . Rosuvastatin Other (See Comments)    REACTION: questionable: severe constipation  . 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 No falls or syncope No fever or weight loss Some intermittent brief chest pain Patient scheduled for cataract surgery April 4 BP 149/85 mmHg  Pulse 98  Resp 20  Ht 5' 8.75" (1.746 m)  Wt 195 lb (88.451 kg)  BMI 29.01 kg/m2  SpO2 98% Physical Exam Alert and oriented, short of breath with exertion HEENT normocephalic pupils equal, dentition good Lungs-breath sounds distant but clear, oxygen saturation at rest 96%--after ambulation 100 feet oxygen saturation 91% Cardiac-irregular rhythm of atrial fibrillation without murmur Abdomen-obese, edematous nontender Extremities-edematous, nontender Neurologic-nonfocal, normal gait  Diagnostic Tests: Chest x-ray today shows no significant left pleural effusion, Pleurx catheter in good position  Impression: Continued slow decline from advanced lung cancer receiving maintenance  chemotherapy.  Plan:will plan on placing a Port-A-Cath and removing left Pleurx catheter on April 8 at [facility] hospital. He will stop his Eliquis 48 hours prior to surgery   Len Childs, MD [facility] [phone]

## 2015-03-16 ENCOUNTER — Telehealth: Payer: Self-pay | Admitting: Family Medicine

## 2015-03-16 ENCOUNTER — Telehealth: Payer: Self-pay

## 2015-03-16 ENCOUNTER — Ambulatory Visit: Payer: Medicare Other | Admitting: Nutrition

## 2015-03-16 NOTE — Telephone Encounter (Signed)
Dr Darnell Level said pt can increase Glimeperide to 2 mg; watch BS and if BS drops or any hypoglycemic reactions Mrs Thain should contact Dr Darnell Level. Mrs Mauriello voiced understanding. Med list updated.

## 2015-03-16 NOTE — Telephone Encounter (Signed)
Mrs Ned said pt finished chemo on 03/14/15, finished steroid on 03/15/15 and today FBS was 375, now BS is 196 (this BS is before lunch). Pt is presently taking Glimeperide 1 mg and has not missed any dosages. Mrs Zepeda wants to know if could increase Glimepreride. Pt's wife said the only symptom pt has is pts face is red; pt set outside yesterday and it may be from getting sun. Face does not feel warm. Pt is not feeling shaky or dizzy. Pt does have some Glimepride 2 mg at home if needs to increase med. [facility] [address]

## 2015-03-16 NOTE — Telephone Encounter (Signed)
Please see separate phone note for 03/16/15.

## 2015-03-16 NOTE — Telephone Encounter (Signed)
Agree. Lab Results  Component Value Date   HGBA1C 7.3* 01/19/2015

## 2015-03-16 NOTE — Progress Notes (Signed)
74 year old male diagnosed with lung cancer in March 2015.  He is a patient of Dr. Earlie Server.  Past medical history includes recurrent left pleural effusion, DMII, diverticulosis, hyperlipidemia, hypertension, tobacco, hepatitis C, GERD, and arthritis.  Medications include Magic mouthwash, Decadron, Folvite, Lasix, Amaryl, multivitamin, MiraLAX, and Carafate.  Labs include glucose 345 and albumin 3.0 on March 22.  Height: 69 inches. Weight: 195 pounds. Usual body weight: 180 pounds. BMI: 29.01.  Patient has a good appetite.  Constipation has improved on new chemotherapy regimen. Patient denies nausea. Patient struggles with edema in his stomach, legs and feet.  He tries to follow a low-sodium diet. Patient reports hyperglycemia.  He tries to control total carbohydrates. Patient verbalizes an understanding that he needs to eat more protein foods. Patient's wife requesting assistance with meal development.  Nutrition diagnosis: Food and nutrition related knowledge deficit related to lung cancer as evidenced by no prior need for nutrition related information.  Intervention: Patient educated to consume small frequent meals and snacks with high protein foods. Stressed importance of carbohydrate controlled diet, especially around times of steroids. Encouraged patient to follow a low-sodium diet. Assisted wife with developing sample meals and snacks using fresh foods and more protein. Questions were answered and teach back method used.  Monitoring, evaluation, goals: Patient will tolerate healthy carbohydrate-controlled, low-sodium diet with high protein foods to improve edema and maintain lean body mass.  Next visit: To be scheduled if needed.  Patient has my contact information.  **Disclaimer: This note was dictated with voice recognition software. Similar sounding words can inadvertently be transcribed and this note may contain transcription errors which may not have been corrected upon  publication of note.**

## 2015-03-16 NOTE — Telephone Encounter (Signed)
Patient Name: Shawn Espinoza  DOB: Mar 08, 1941    Initial Comment Caller states husband c/o elevated blood sugar during chemo treatment - would like to speak with nurse re: dosing instructions   Nurse Assessment  Nurse: Mallie Mussel, RN, Alveta Heimlich Date/Time Eilene Ghazi Time): 03/16/2015 1:41:12 PM  Confirm and document reason for call. If symptomatic, describe symptoms. ---Caller states that her husband has had chemo treatments and has finished steroids yesterday. This morning, it was 375. She wants to know if she needs to give him more glimepiride 1mg . His current reading is 196. I called the backline and spoke with Ebony Hail who transferred me to [facility]. Information given and I did a warm transfer to [place].  Has the patient traveled out of the country within the last 30 days? ---Not Applicable  Does the patient require triage? ---No     Guidelines    Guideline Title Affirmed Question Affirmed Notes       Final Disposition User   Clinical Call Mallie Mussel, RN, Alveta Heimlich

## 2015-03-21 ENCOUNTER — Other Ambulatory Visit: Payer: Self-pay | Admitting: Medical Oncology

## 2015-03-21 ENCOUNTER — Telehealth: Payer: Self-pay | Admitting: *Deleted

## 2015-03-21 ENCOUNTER — Ambulatory Visit (HOSPITAL_COMMUNITY)
Admission: RE | Admit: 2015-03-21 | Discharge: 2015-03-21 | Disposition: A | Discharge status: Home / Self Care | Payer: Medicare Other | Source: Ambulatory Visit | Attending: Internal Medicine | Admitting: Internal Medicine

## 2015-03-21 ENCOUNTER — Other Ambulatory Visit (HOSPITAL_BASED_OUTPATIENT_CLINIC_OR_DEPARTMENT_OTHER): Payer: Medicare Other

## 2015-03-21 ENCOUNTER — Other Ambulatory Visit: Payer: Medicare Other

## 2015-03-21 ENCOUNTER — Ambulatory Visit (HOSPITAL_BASED_OUTPATIENT_CLINIC_OR_DEPARTMENT_OTHER): Payer: Medicare Other

## 2015-03-21 ENCOUNTER — Other Ambulatory Visit: Payer: Self-pay | Admitting: Family Medicine

## 2015-03-21 ENCOUNTER — Telehealth: Payer: Self-pay | Admitting: Medical Oncology

## 2015-03-21 ENCOUNTER — Other Ambulatory Visit: Payer: Self-pay | Admitting: Internal Medicine

## 2015-03-21 DIAGNOSIS — E46 Unspecified protein-calorie malnutrition: Secondary | ICD-10-CM

## 2015-03-21 DIAGNOSIS — Z5189 Encounter for other specified aftercare: Secondary | ICD-10-CM | POA: Diagnosis not present

## 2015-03-21 DIAGNOSIS — C3412 Malignant neoplasm of upper lobe, left bronchus or lung: Secondary | ICD-10-CM

## 2015-03-21 DIAGNOSIS — D6481 Anemia due to antineoplastic chemotherapy: Secondary | ICD-10-CM | POA: Diagnosis not present

## 2015-03-21 DIAGNOSIS — E785 Hyperlipidemia, unspecified: Secondary | ICD-10-CM

## 2015-03-21 DIAGNOSIS — C3492 Malignant neoplasm of unspecified part of left bronchus or lung: Secondary | ICD-10-CM

## 2015-03-21 DIAGNOSIS — E039 Hypothyroidism, unspecified: Secondary | ICD-10-CM

## 2015-03-21 DIAGNOSIS — T451X5A Adverse effect of antineoplastic and immunosuppressive drugs, initial encounter: Secondary | ICD-10-CM | POA: Diagnosis not present

## 2015-03-21 DIAGNOSIS — Z95828 Presence of other vascular implants and grafts: Secondary | ICD-10-CM

## 2015-03-21 DIAGNOSIS — I10 Essential (primary) hypertension: Secondary | ICD-10-CM

## 2015-03-21 DIAGNOSIS — E099 Drug or chemical induced diabetes mellitus without complications: Secondary | ICD-10-CM

## 2015-03-21 DIAGNOSIS — T380X5A Adverse effect of glucocorticoids and synthetic analogues, initial encounter: Secondary | ICD-10-CM

## 2015-03-21 DIAGNOSIS — N400 Enlarged prostate without lower urinary tract symptoms: Secondary | ICD-10-CM

## 2015-03-21 LAB — COMPREHENSIVE METABOLIC PANEL (CC13)
ALK PHOS: 70 U/L (ref 40–150)
ALT: 27 U/L (ref 0–55)
AST: 19 U/L (ref 5–34)
Albumin: 2.8 g/dL — ABNORMAL LOW (ref 3.5–5.0)
Anion Gap: 10 mEq/L (ref 3–11)
BUN: 18.5 mg/dL (ref 7.0–26.0)
CO2: 25 meq/L (ref 22–29)
Calcium: 8.5 mg/dL (ref 8.4–10.4)
Chloride: 100 mEq/L (ref 98–109)
Creatinine: 0.8 mg/dL (ref 0.7–1.3)
EGFR: 88 mL/min/{1.73_m2} — AB (ref >90)
Glucose: 270 mg/dl — ABNORMAL HIGH (ref 70–140)
POTASSIUM: 4.2 meq/L (ref 3.5–5.1)
Sodium: 135 mEq/L — ABNORMAL LOW (ref 136–145)
Total Bilirubin: 1.41 mg/dL — ABNORMAL HIGH (ref 0.20–1.20)
Total Protein: 6 g/dL — ABNORMAL LOW (ref 6.4–8.3)

## 2015-03-21 LAB — CBC WITH DIFFERENTIAL/PLATELET
BASO%: 0.7 % (ref 0.0–2.0)
Basophils Absolute: 0 10*3/uL (ref 0.0–0.1)
EOS ABS: 0 10*3/uL (ref 0.0–0.5)
EOS%: 0.2 % (ref 0.0–7.0)
HCT: 24.5 % — ABNORMAL LOW (ref 38.4–49.9)
HGB: 7.8 g/dL — ABNORMAL LOW (ref 13.0–17.1)
LYMPH%: 26.7 % (ref 14.0–49.0)
MCH: 30.2 pg (ref 27.2–33.4)
MCHC: 31.9 g/dL — ABNORMAL LOW (ref 32.0–36.0)
MCV: 94.8 fL (ref 79.3–98.0)
MONO#: 0.3 10*3/uL (ref 0.1–0.9)
MONO%: 25.6 % — ABNORMAL HIGH (ref 0.0–14.0)
NEUT%: 46.8 % (ref 39.0–75.0)
NEUTROS ABS: 0.5 10*3/uL — AB (ref 1.5–6.5)
Platelets: 132 10*3/uL — ABNORMAL LOW (ref 140–400)
RBC: 2.58 10*6/uL — ABNORMAL LOW (ref 4.20–5.82)
RDW: 18.5 % — ABNORMAL HIGH (ref 11.0–14.6)
WBC: 1 10*3/uL — ABNORMAL LOW (ref 4.0–10.3)
lymph#: 0.3 10*3/uL — ABNORMAL LOW (ref 0.9–3.3)

## 2015-03-21 LAB — HOLD TUBE, BLOOD BANK

## 2015-03-21 LAB — ABO/RH: ABO/RH(D): A NEG

## 2015-03-21 MED ORDER — TBO-FILGRASTIM 480 MCG/0.8ML ~~LOC~~ SOSY
480.0000 ug | PREFILLED_SYRINGE | Freq: Once | SUBCUTANEOUS | Status: AC
  Administered 2015-03-21: 480 ug via SUBCUTANEOUS
  Filled 2015-03-21: qty 0.8

## 2015-03-21 MED ORDER — FILGRASTIM 480 MCG/0.8ML IJ SOSY: 480.0000 ug | PREFILLED_SYRINGE | Freq: Once | INTRAMUSCULAR | Status: DC

## 2015-03-21 NOTE — Progress Notes (Signed)
Har done

## 2015-03-21 NOTE — Addendum Note (Signed)
Addended by: Ardeen Garland on: 03/21/2015 03:51 PM   Modules accepted: Orders, SmartSet

## 2015-03-21 NOTE — Telephone Encounter (Signed)
Per staff message and POF I have scheduled appts. Advised scheduler of appts. JMW  

## 2015-03-21 NOTE — Patient Instructions (Signed)

## 2015-03-21 NOTE — Telephone Encounter (Signed)
I spoke to pt wife and pt coming back today for granix and type and cross

## 2015-03-21 NOTE — Telephone Encounter (Signed)
I left a message for pt to call back if he wants a blood transfusion as hgb 7.8. I left instructions about neutropenic precautions and when to call.

## 2015-03-22 ENCOUNTER — Other Ambulatory Visit (INDEPENDENT_AMBULATORY_CARE_PROVIDER_SITE_OTHER): Payer: Medicare Other

## 2015-03-22 ENCOUNTER — Ambulatory Visit (HOSPITAL_BASED_OUTPATIENT_CLINIC_OR_DEPARTMENT_OTHER): Payer: Medicare Other

## 2015-03-22 DIAGNOSIS — D61818 Other pancytopenia: Secondary | ICD-10-CM

## 2015-03-22 DIAGNOSIS — I10 Essential (primary) hypertension: Secondary | ICD-10-CM

## 2015-03-22 DIAGNOSIS — E039 Hypothyroidism, unspecified: Secondary | ICD-10-CM

## 2015-03-22 DIAGNOSIS — N400 Enlarged prostate without lower urinary tract symptoms: Secondary | ICD-10-CM

## 2015-03-22 DIAGNOSIS — E46 Unspecified protein-calorie malnutrition: Secondary | ICD-10-CM | POA: Diagnosis not present

## 2015-03-22 DIAGNOSIS — E785 Hyperlipidemia, unspecified: Secondary | ICD-10-CM

## 2015-03-22 DIAGNOSIS — Z5189 Encounter for other specified aftercare: Secondary | ICD-10-CM

## 2015-03-22 DIAGNOSIS — C3412 Malignant neoplasm of upper lobe, left bronchus or lung: Secondary | ICD-10-CM | POA: Diagnosis not present

## 2015-03-22 LAB — RENAL FUNCTION PANEL
ALBUMIN: 3 g/dL — AB (ref 3.5–5.2)
BUN: 19 mg/dL (ref 6–23)
CHLORIDE: 101 meq/L (ref 96–112)
CO2: 29 meq/L (ref 19–32)
CREATININE: 0.8 mg/dL (ref 0.40–1.50)
Calcium: 8.3 mg/dL — ABNORMAL LOW (ref 8.4–10.5)
GFR: 100.34 mL/min (ref >60.00)
Glucose, Bld: 195 mg/dL — ABNORMAL HIGH (ref 70–99)
Phosphorus: 2.2 mg/dL — ABNORMAL LOW (ref 2.3–4.6)
Potassium: 4.3 mEq/L (ref 3.5–5.1)
Sodium: 137 mEq/L (ref 135–145)

## 2015-03-22 LAB — TSH: TSH: 4.99 u[IU]/mL — ABNORMAL HIGH (ref 0.35–4.50)

## 2015-03-22 LAB — LIPID PANEL
Cholesterol: 155 mg/dL (ref 0–200)
HDL: 40.5 mg/dL (ref >39.00)
LDL CALC: 88 mg/dL (ref 0–99)
NonHDL: 114.5
Total CHOL/HDL Ratio: 4
Triglycerides: 131 mg/dL (ref 0.0–149.0)
VLDL: 26.2 mg/dL (ref 0.0–40.0)

## 2015-03-22 LAB — T4, FREE: FREE T4: 0.98 ng/dL (ref 0.60–1.60)

## 2015-03-22 LAB — PSA: PSA: 0.92 ng/mL (ref 0.10–4.00)

## 2015-03-22 MED ORDER — TBO-FILGRASTIM 480 MCG/0.8ML ~~LOC~~ SOSY
480.0000 ug | PREFILLED_SYRINGE | Freq: Once | SUBCUTANEOUS | Status: AC
  Administered 2015-03-22: 480 ug via SUBCUTANEOUS
  Filled 2015-03-22: qty 0.8

## 2015-03-22 MED ORDER — FILGRASTIM 480 MCG/0.8ML IJ SOSY: 480.0000 ug | PREFILLED_SYRINGE | Freq: Once | INTRAMUSCULAR | Status: DC

## 2015-03-22 NOTE — Pre-Procedure Instructions (Signed)
Shawn Espinoza  03/22/2015   Your procedure is scheduled on:  Friday March 31, 2015 at 7:30 AM.  Report to [facility] Admitting at 5:30 AM.  Call this number if you have problems the morning of surgery: [phone]   Remember:   Do not eat food or drink liquids after midnight.   Take these medicines the morning of surgery with A SIP OF WATER: Albuterol inhaler IF needed, Hydrocodone if needed for pain, Lorazepam (Ativan) if needed, Metoprolol (Lopressor)   Do NOT take any diabetic medications the morning of your surgery   Please stop taking Eliquis 48 hours before your surgery (April 6th)   Stop taking any vitamins, Ibuprofen, Advil, Motrin, Alleve, etc on Friday April 1st   Do not wear jewelry.  Do not wear lotions, powders, or cologne. You may NOT wear deodorant.  Men may shave face and neck.  Do not bring valuables to the hospital.  [facility] is not responsible for any belongings or valuables.               Contacts, dentures or bridgework may not be worn into surgery.  Leave suitcase in the car. After surgery it may be brought to your room.  For patients admitted to the hospital, discharge time is determined by your treatment team.               Patients discharged the day of surgery will not be allowed to drive home.  Name and phone number of your driver:   Special Instructions: Shower using CHG soap the night before and the morning of your surgery   Please read over the following fact sheets that you were given: Pain Booklet, Coughing and Deep Breathing and Surgical Site Infection Prevention

## 2015-03-23 ENCOUNTER — Ambulatory Visit (HOSPITAL_COMMUNITY)
Admission: RE | Admit: 2015-03-23 | Discharge: 2015-03-23 | Disposition: A | Discharge status: Home / Self Care | Payer: Medicare Other | Source: Ambulatory Visit | Attending: Cardiothoracic Surgery | Admitting: Cardiothoracic Surgery

## 2015-03-23 ENCOUNTER — Encounter (HOSPITAL_COMMUNITY)
Admission: RE | Admit: 2015-03-23 | Discharge: 2015-03-23 | Disposition: A | Discharge status: Home / Self Care | Payer: Medicare Other | Source: Ambulatory Visit | Attending: Cardiothoracic Surgery | Admitting: Cardiothoracic Surgery

## 2015-03-23 ENCOUNTER — Encounter (HOSPITAL_COMMUNITY): Payer: Self-pay

## 2015-03-23 DIAGNOSIS — R0600 Dyspnea, unspecified: Secondary | ICD-10-CM | POA: Diagnosis not present

## 2015-03-23 DIAGNOSIS — R0602 Shortness of breath: Secondary | ICD-10-CM | POA: Diagnosis not present

## 2015-03-23 HISTORY — DX: Major depressive disorder, single episode, unspecified: F32.9

## 2015-03-23 HISTORY — DX: Depression, unspecified: F32.A

## 2015-03-23 NOTE — Progress Notes (Signed)
Patient came to PAT via wheelchair with wife. Patient appeared to have labored breathing but stated it was not new and that Dr. Prescott Gum was aware. Patient stated "I get like this whenever I stand up." Patient denied needing any oxygen at this time. O2 sats on room air were 100%. Patient informed Nurse that he has been having some chest pain and shortness of breath and that he was scheduled to have a Echocardiogram done today at [facility] at 1345.   Nurse noticed that patient had lab work done on 03/21/15 and 03/22/15. Decreased Hgb/hct noted. Patient informed Nurse that he was scheduled to have a blood transfusion tomorrow (03/24/15), and that he was also going to have repeat lab work on Wednesday 03/29/15 at the [facility]. Therefore Nurse decided NOT to obtain lab work today. Levonne Spiller informed of this (at 1205) and she stated "it was OK since he already had lab work done."  When asked about Eliquis patients wife stated "he was instructed to stop it on the 6th".   Patient is scheduled to have cataract surgery done on 03/28/15   PCP is Toney Rakes.

## 2015-03-23 NOTE — Progress Notes (Addendum)
Anesthesia Chart Review:  Patient is a 74 year old male scheduled for insertion of a Port-a-cath and removal of a left Pleurex catheter on 03/31/15 by Dr. Tharon Aquas Trigt.  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 Reportedly, he is scheduled to undergo cataract surgery on 03/28/15.  PCP is Dr. Danise Mina. Afib is followed by his PCP. HEM-ONC is Dr. [name].   Meds include albuterol, Eliquis, Zyrtec, Decadron PRN chemotherapy, Lasix, Amaryl, Norco, Ativan, Robaxin, Lopressor, potassium gluconate, Carafate. He received Alimta on 03/14/15 (scheduled as maintenance chemotherapy every three weeks). Dr. Prescott Gum instructed him to stop Eliquis 48 hours prior to surgery.    EKG 01/23/15: afib at 92 bpm, non-specific T wave abnormality.  03/23/15 Echo results pending.  He is for a CXR on the day of surgery.  He had recent labs drawn on 03/21/15 that showed a WBC of 1.0, Neut% 46.8, Neut# 0.5.  H/H 7.8/24.5. AST/ALT WNL. He was started on Neutopenia precautions and received Granix 480 mcg injection on 03/22/15.  He is scheduled to receive PRBC transfusion on 03/24/15. BMET and thyroid studies on 03/22/15 showed a minimally low Calcium and phosphorus, Cr 0.80, glucose 195, mildly elevated TSH 4.99 with normal free T4. He is scheduled for repeat labs on 03/27/15 at [facility]. He also has a PCP follow-up visit scheduled for 03/30/15.   I will leave chart for follow-up regarding echo results and upcoming lab and office appointments.   Shawn Espinoza [facility]/Anesthesiology Phone [phone] 03/23/2015 4:47 PM  Addendum:  03/23/15 Echo: - Left ventricle: The cavity size was normal. There was mild concentric hypertrophy. Systolic function was normal. The estimated ejection fraction  was in the range of 60% to 65%. Wall motion was normal; there were no regional wall motion abnormalities. - Aortic valve: Trileaflet; mildly thickened, mildly calcified leaflets with restriction of right coronary cusp. 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 Peak velocity (S): 240 cm/s. Valve area (VTI): 1.38 cm^2. Valve area (Vmax): 1.21 cm^2. Valve area (Vmean): 1.29 cm^2. - Left atrium: The atrium was mildly dilated. - Right atrium: The atrium was mildly dilated. - Pulmonary arteries: Systolic pressure was mildly increased. PA peak pressure: 41 mm Hg (S). Impressions: When compared to prior echocardiogram, peak velocity transaorticis reduced. This may be secondary to Doppler technique.  Repeat labs on 03/27/15 showed: WBC up to 8.3, H/H 10.7/33.4 (post-transfusion).  PLT count down to 75K (previously 66-297K since 03/01/15). I routed his CBC results to Dr. Prescott Gum earlier this week for review. Plan to repeat CBC for the day of surgery to re-evaluate for thrombocytopenia, anemia.  There are already orders entered from [facility] that include a CBC, PT/PTT, CMET (although CMET already done on 03/27/15).    He is scheduled to see Dr. Danise Mina later today for what looks like a routine physical.  Shawn Espinoza [facility]/Anesthesiology Phone [phone] 03/30/2015 9:43 AM

## 2015-03-23 NOTE — Progress Notes (Signed)
  Echocardiogram 2D Echocardiogram has been performed.  Taneeka Curtner FRANCES 03/23/2015, 2:15 PM

## 2015-03-24 ENCOUNTER — Ambulatory Visit (HOSPITAL_BASED_OUTPATIENT_CLINIC_OR_DEPARTMENT_OTHER): Payer: Medicare Other

## 2015-03-24 ENCOUNTER — Ambulatory Visit (HOSPITAL_COMMUNITY)
Admission: RE | Admit: 2015-03-24 | Discharge: 2015-03-24 | Disposition: A | Discharge status: Home / Self Care | Payer: Medicare Other | Source: Ambulatory Visit | Attending: Internal Medicine | Admitting: Internal Medicine

## 2015-03-24 ENCOUNTER — Other Ambulatory Visit (HOSPITAL_COMMUNITY): Payer: Medicare Other

## 2015-03-24 ENCOUNTER — Other Ambulatory Visit: Payer: Self-pay | Admitting: Medical Oncology

## 2015-03-24 VITALS — BP 132/77 | HR 99 | Temp 98.5°F | Resp 18

## 2015-03-24 DIAGNOSIS — T451X5A Adverse effect of antineoplastic and immunosuppressive drugs, initial encounter: Principal | ICD-10-CM

## 2015-03-24 DIAGNOSIS — L270 Generalized skin eruption due to drugs and medicaments taken internally: Secondary | ICD-10-CM

## 2015-03-24 DIAGNOSIS — D6481 Anemia due to antineoplastic chemotherapy: Secondary | ICD-10-CM | POA: Insufficient documentation

## 2015-03-24 DIAGNOSIS — Z95828 Presence of other vascular implants and grafts: Secondary | ICD-10-CM

## 2015-03-24 HISTORY — PX: CATARACT EXTRACTION: SUR2

## 2015-03-24 LAB — PREPARE RBC (CROSSMATCH)

## 2015-03-24 MED ORDER — SODIUM CHLORIDE 0.9 % IV SOLN
250.0000 mL | Freq: Once | INTRAVENOUS | Status: AC
  Administered 2015-03-24: 250 mL via INTRAVENOUS

## 2015-03-24 MED ORDER — DIPHENHYDRAMINE HCL 25 MG PO CAPS
ORAL_CAPSULE | ORAL | Status: AC
  Filled 2015-03-24: qty 1

## 2015-03-24 MED ORDER — METHYLPREDNISOLONE (PAK) 4 MG PO TABS: ORAL_TABLET | ORAL | Status: DC

## 2015-03-24 MED ORDER — DIPHENHYDRAMINE HCL 25 MG PO CAPS
25.0000 mg | ORAL_CAPSULE | Freq: Once | ORAL | Status: AC
  Administered 2015-03-24: 25 mg via ORAL

## 2015-03-24 MED ORDER — ACETAMINOPHEN 325 MG PO TABS
650.0000 mg | ORAL_TABLET | Freq: Once | ORAL | Status: AC
  Administered 2015-03-24: 650 mg via ORAL

## 2015-03-24 MED ORDER — ACETAMINOPHEN 325 MG PO TABS
ORAL_TABLET | ORAL | Status: AC
  Filled 2015-03-24: qty 2

## 2015-03-24 NOTE — Patient Instructions (Signed)

## 2015-03-24 NOTE — Progress Notes (Signed)
Pt reports lower extremity red rash on lower legs x 1 month and is getting worse. Denies itching, pain . Bumpy , red rash noted on both lower legs.

## 2015-03-25 LAB — TYPE AND SCREEN
ABO/RH(D): A NEG
Antibody Screen: NEGATIVE
Unit division: 0
Unit division: 0

## 2015-03-27 ENCOUNTER — Other Ambulatory Visit (HOSPITAL_BASED_OUTPATIENT_CLINIC_OR_DEPARTMENT_OTHER): Payer: Medicare Other

## 2015-03-27 DIAGNOSIS — C3412 Malignant neoplasm of upper lobe, left bronchus or lung: Secondary | ICD-10-CM

## 2015-03-27 DIAGNOSIS — C3490 Malignant neoplasm of unspecified part of unspecified bronchus or lung: Secondary | ICD-10-CM

## 2015-03-27 LAB — COMPREHENSIVE METABOLIC PANEL (CC13)
ALK PHOS: 97 U/L (ref 40–150)
ALT: 23 U/L (ref 0–55)
AST: 26 U/L (ref 5–34)
Albumin: 2.9 g/dL — ABNORMAL LOW (ref 3.5–5.0)
Anion Gap: 13 mEq/L — ABNORMAL HIGH (ref 3–11)
BILIRUBIN TOTAL: 0.85 mg/dL (ref 0.20–1.20)
BUN: 15.3 mg/dL (ref 7.0–26.0)
CO2: 24 mEq/L (ref 22–29)
CREATININE: 0.9 mg/dL (ref 0.7–1.3)
Calcium: 8.3 mg/dL — ABNORMAL LOW (ref 8.4–10.4)
Chloride: 104 mEq/L (ref 98–109)
EGFR: 84 mL/min/{1.73_m2} — AB (ref >90)
Glucose: 205 mg/dl — ABNORMAL HIGH (ref 70–140)
Potassium: 3.8 mEq/L (ref 3.5–5.1)
Sodium: 141 mEq/L (ref 136–145)
Total Protein: 5.8 g/dL — ABNORMAL LOW (ref 6.4–8.3)

## 2015-03-27 LAB — CBC WITH DIFFERENTIAL/PLATELET
BASO%: 0.1 % (ref 0.0–2.0)
BASOS ABS: 0 10*3/uL (ref 0.0–0.1)
EOS ABS: 0 10*3/uL (ref 0.0–0.5)
EOS%: 0.1 % (ref 0.0–7.0)
HCT: 33.4 % — ABNORMAL LOW (ref 38.4–49.9)
HGB: 10.7 g/dL — ABNORMAL LOW (ref 13.0–17.1)
LYMPH%: 9.6 % — AB (ref 14.0–49.0)
MCH: 31.1 pg (ref 27.2–33.4)
MCHC: 32 g/dL (ref 32.0–36.0)
MCV: 97.1 fL (ref 79.3–98.0)
MONO#: 0.9 10*3/uL (ref 0.1–0.9)
MONO%: 10.9 % (ref 0.0–14.0)
NEUT%: 79.3 % — AB (ref 39.0–75.0)
NEUTROS ABS: 6.6 10*3/uL — AB (ref 1.5–6.5)
Platelets: 75 10*3/uL — ABNORMAL LOW (ref 140–400)
RBC: 3.44 10*6/uL — AB (ref 4.20–5.82)
RDW: 18 % — ABNORMAL HIGH (ref 11.0–14.6)
WBC: 8.3 10*3/uL (ref 4.0–10.3)
lymph#: 0.8 10*3/uL — ABNORMAL LOW (ref 0.9–3.3)

## 2015-03-28 ENCOUNTER — Other Ambulatory Visit: Payer: Medicare Other

## 2015-03-28 DIAGNOSIS — H2511 Age-related nuclear cataract, right eye: Secondary | ICD-10-CM | POA: Diagnosis not present

## 2015-03-28 NOTE — Telephone Encounter (Signed)
error 

## 2015-03-29 ENCOUNTER — Encounter: Payer: Medicare Other | Admitting: Family Medicine

## 2015-03-30 ENCOUNTER — Encounter: Payer: Self-pay | Admitting: Family Medicine

## 2015-03-30 ENCOUNTER — Ambulatory Visit (INDEPENDENT_AMBULATORY_CARE_PROVIDER_SITE_OTHER): Payer: Medicare Other | Admitting: Family Medicine

## 2015-03-30 VITALS — BP 142/90 | HR 72 | Temp 98.0°F | Ht 67.5 in | Wt 210.5 lb

## 2015-03-30 DIAGNOSIS — E785 Hyperlipidemia, unspecified: Secondary | ICD-10-CM

## 2015-03-30 DIAGNOSIS — R7989 Other specified abnormal findings of blood chemistry: Secondary | ICD-10-CM

## 2015-03-30 DIAGNOSIS — I4891 Unspecified atrial fibrillation: Secondary | ICD-10-CM | POA: Diagnosis not present

## 2015-03-30 DIAGNOSIS — Z7189 Other specified counseling: Secondary | ICD-10-CM

## 2015-03-30 DIAGNOSIS — C3492 Malignant neoplasm of unspecified part of left bronchus or lung: Secondary | ICD-10-CM | POA: Diagnosis not present

## 2015-03-30 DIAGNOSIS — E099 Drug or chemical induced diabetes mellitus without complications: Secondary | ICD-10-CM

## 2015-03-30 DIAGNOSIS — I10 Essential (primary) hypertension: Secondary | ICD-10-CM | POA: Diagnosis not present

## 2015-03-30 DIAGNOSIS — Z0000 Encounter for general adult medical examination without abnormal findings: Secondary | ICD-10-CM

## 2015-03-30 DIAGNOSIS — E8809 Other disorders of plasma-protein metabolism, not elsewhere classified: Secondary | ICD-10-CM

## 2015-03-30 DIAGNOSIS — T380X5A Adverse effect of glucocorticoids and synthetic analogues, initial encounter: Secondary | ICD-10-CM

## 2015-03-30 DIAGNOSIS — R600 Localized edema: Secondary | ICD-10-CM

## 2015-03-30 DIAGNOSIS — E46 Unspecified protein-calorie malnutrition: Secondary | ICD-10-CM

## 2015-03-30 DIAGNOSIS — J90 Pleural effusion, not elsewhere classified: Secondary | ICD-10-CM

## 2015-03-30 MED ORDER — GLIMEPIRIDE 2 MG PO TABS: 2.0000 mg | ORAL_TABLET | Freq: Every day | ORAL | Status: DC

## 2015-03-30 MED ORDER — DEXTROSE 5 % IV SOLN
1.5000 g | INTRAVENOUS | Status: AC
  Administered 2015-03-31: 1.5 g via INTRAVENOUS
  Filled 2015-03-30: qty 1.5

## 2015-03-30 MED ORDER — SERTRALINE HCL 25 MG PO TABS: 25.0000 mg | ORAL_TABLET | Freq: Every day | ORAL | Status: DC

## 2015-03-30 NOTE — Assessment & Plan Note (Signed)
Appreciate onc care.  

## 2015-03-30 NOTE — Assessment & Plan Note (Signed)
Chronic, stable. Continue regimen. Persistent dizziness - check orthostatics today.

## 2015-03-30 NOTE — Assessment & Plan Note (Addendum)
cbg's remain elevated - will increase amaryl to 2mg  daily and monitor for low sugars. Foot exam today. Diabetic shoe forms filled today.

## 2015-03-30 NOTE — Patient Instructions (Addendum)
Let's increase amaryl to 2mg  daily and watch for low sugars. Take both with breakfast. Look into glucerna 1/2 can daily. Try lasix 80mg  3 days in a row. Let me know how this works. Think about mood medicine (if you decide to try may start sertraline 25mg  daily). Trial for 1 mo to see if any benefit noted. Check with onc to see if and when is best to get prevnar (pneumonia shot). Return as needed or in 4-6 months for follow up.

## 2015-03-30 NOTE — Assessment & Plan Note (Signed)
Marked. Will increase lasix to 80mg  for 3 days and monitor for effect. Work towards increased protein in diet (through [employer])

## 2015-03-30 NOTE — Assessment & Plan Note (Signed)
See above

## 2015-03-30 NOTE — Assessment & Plan Note (Signed)
Continue eliquis. On hold for tomorrow's port a cath procedure and removal of pleurx.

## 2015-03-30 NOTE — Progress Notes (Signed)
Pt already made aware to arrive at 12:00 PM

## 2015-03-30 NOTE — Progress Notes (Signed)
BP 138/82 mmHg  Pulse 80  Temp(Src) 98 F (36.7 C) (Oral)  Ht 5' 7.5" (1.715 m)  Wt 210 lb 8 oz (95.482 kg)  BMI 32.46 kg/m2   CC: medicare wellness visit  Subjective:    Patient ID: Shawn Espinoza, male    DOB: 1941/08/12, 74 y.o.   MRN: [id number]  HPI: Shawn Espinoza is a 74 y.o. 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 PortACath to be placed for continued maintenance chemo (carboplatin and alimta). Last session 2 wks ago. Hepatic mass presumed met. Had transfusion 2u pRBC and neutropenia shot last week.  Leg swelling up to abdomen + mildly erythematous rash. Has prednisone and topical steroid per onc to use but wants to wait until portacath is placed. Main concern is edema.   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 Fasting cbg today 169. No low sugars.   Diabetic Foot Exam - Simple   Simple Foot Form  Diabetic Foot exam was performed with the following findings:  Yes 03/30/2015 12:31 PM  Visual Inspection  See comments:  Yes  Sensation Testing  See comments:  Yes  Pulse Check  Posterior Tibialis and Dorsalis pulse intact bilaterally:  Yes  Comments  Marked pedal edema diminished sensation to monofilament testing      Hearing screen passed today.  Vision screen at [facility]. [facility] (03/2015) Denies falls.  Situational depression with lung cancer dx. PHQ9 = 10.   Preventative: COLONOSCOPY Date: 2014 tubular adenoma x1, mod diverticulosis (GCarlean PurlProstate - checked yearly, no concerns. PSA WNL.  Flu shot - 08/2014 Hep A and B shots done 2001 Pneumovax 10/2006. Due for prevnar.  Td  2006 zostavax - 08/2012 Advanced directives: living will in chart. Wouldn't want prolonged artificial life support. Wife CaBarnetta Chapelould be HCproxy Seat belt use discussed  Lives with wife. [employer], failed eye exam, quit Activity: active at gym regularly, golfing Diet: healthy - oatmeal, avoids white starches  Relevant past medical, surgical, family and social history reviewed and updated as indicated. Interim medical history since our last visit reviewed. Allergies and medications reviewed and updated. Current Outpatient Prescriptions on File Prior to Visit  Medication Sig  . albuterol (PROVENTIL HFA;VENTOLIN HFA) 108 (90 BASE) MCG/ACT inhaler Inhale 2 puffs into the lungs every 6 (six) hours as needed for wheezing or shortness of breath.  . 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 cetirizine (ZYRTEC) 10 MG tablet Take 10 mg by mouth daily as needed for allergies or rhinitis.   . Marland Kitchenexamethasone (DECADRON) 4 MG tablet 4 MG BY MOUTH TWICE A DAY THE DAY BEFORE, DAY OF AND DAY AFTER THE CHEMOTHERAPY EVERY 3 WEEKS. (Patient taking differently: 4 MG BY MOUTH TWICE A DAY THE DAY BEFORE, DAY OF AND DAY AFTER THE CHEMOTHERAPY AND THEN ONCE DAILY THEREAFTER.)  . diphenoxylate-atropine (LOMOTIL) 2.5-0.025 MG per tablet Take 2 tablets by mouth 4 (four) times daily as needed for diarrhea or loose stools.  . furosemide (LASIX) 40 MG tablet Take 1 tablet (40 mg total) by mouth daily.  . Marland Kitchenlucose blood test strip Use as  instructed to check blood sugar once daily and as needed. 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 HYDROcodone-acetaminophen (NORCO/VICODIN) 5-325 MG per tablet Take 1 tablet by mouth every 6 (six) hours as needed for moderate pain.  . Lancets MISC Use to check sugar once daily and as needed Dx: M84.1 **ONE TOUCH DELICA**  . LORazepam (ATIVAN) 0.5 MG tablet Take 0.5 mg by mouth 2 (two) times daily as needed for anxiety.   . metoprolol  tartrate (LOPRESSOR) 25 MG tablet Take 1 tablet (25 mg total) by mouth 2 (two) times daily.  . polyethylene glycol (MIRALAX / GLYCOLAX) packet Take 17 g by mouth daily.   . potassium gluconate 595 MG TABS tablet Take 595 mg by mouth daily.  . Alum & Mag Hydroxide-Simeth (MAGIC MOUTHWASH W/LIDOCAINE) SOLN Take 5 mLs by mouth 3 (three) times daily as needed for mouth pain. (Patient not taking: Reported on 03/30/2015)  . apixaban (ELIQUIS) 5 MG TABS tablet Take 1 tablet (5 mg total) by mouth 2 (two) times daily. (Patient not taking: Reported on 03/30/2015)  . folic acid (FOLVITE) 1 MG tablet TAKE 1 TABLET BY MOUTH EVERY DAY (Patient not taking: Reported on 03/30/2015)  . Multiple Vitamins-Minerals (ICAPS) CAPS Take by mouth 2 (two) times daily.  Marland Kitchen nystatin (MYCOSTATIN) 100000 UNIT/ML suspension Take 5 mLs (500,000 Units total) by mouth 3 (three) times daily. (Patient not taking: Reported on 03/30/2015)  . sucralfate (CARAFATE) 1 GM/10ML suspension Take 1 g by mouth 4 (four) times daily as needed (reflux).   No current facility-administered medications on file prior to visit.    Review of Systems Per HPI unless specifically indicated above     Objective:    BP 138/82 mmHg  Pulse 80  Temp(Src) 98 F (36.7 C) (Oral)  Ht 5' 7.5" (1.715 m)  Wt 210 lb 8 oz (95.482 kg)  BMI 32.46 kg/m2  Wt Readings from Last 3 Encounters:  03/30/15 210 lb 8 oz (95.482 kg)  03/15/15 195 lb (88.451 kg)  03/14/15 209 lb (94.802 kg)    Physical Exam  Constitutional: He is oriented to person, place, and time. He appears well-developed and well-nourished. No distress.  HENT:  Head: Normocephalic and atraumatic.  Right Ear: Hearing, tympanic membrane, external ear and ear canal normal.  Left Ear: Hearing, tympanic membrane, external ear and ear canal normal.  Nose: Nose normal.  Mouth/Throat: Uvula is midline, oropharynx is clear and moist and mucous membranes are normal. No oropharyngeal exudate, posterior  oropharyngeal edema or posterior oropharyngeal erythema.  Eyes: Conjunctivae and EOM are normal. Pupils are equal, round, and reactive to light. No scleral icterus.  Neck: Normal range of motion. Neck supple. Carotid bruit is not present. No thyromegaly present.  Cardiovascular: Normal rate, regular rhythm, normal heart sounds and intact distal pulses.   No murmur heard. Pulses:      Radial pulses are 2+ on the right side, and 2+ on the left side.  Pulmonary/Chest: Effort normal and breath sounds normal. No respiratory distress. He has no wheezes. He has no rales.  Abdominal: Soft. Bowel sounds are normal. He exhibits no distension and no mass. There is no tenderness. There is no rebound and no guarding.  Genitourinary:  deferred  Musculoskeletal: Normal range of motion. He exhibits edema (1+ pitting to thighs).  See HPI for foot exam if done  Lymphadenopathy:    He has no cervical adenopathy.  Neurological: He is alert and oriented to person, place, and time.  CN grossly intact, station and gait intact  Recall 3/3 calculation 5/5 serial 7s  Skin: Skin is warm and dry. Rash (slightly erythematous on bilateral legs) noted.  Psychiatric: He has a normal mood and affect. His behavior is normal. Judgment and thought content normal.  Nursing note and vitals reviewed.  Results for orders placed or performed in visit on 03/27/15  CBC with Differential  Result Value Ref Range   WBC 8.3 4.0 - 10.3 10e3/uL   NEUT# 6.6 (H) 1.5 - 6.5 10e3/uL   HGB 10.7 (L) 13.0 - 17.1 g/dL   HCT 33.4 (L) 38.4 - 49.9 %   Platelets 75 (L) 140 - 400 10e3/uL   MCV 97.1 79.3 - 98.0 fL   MCH 31.1 27.2 - 33.4 pg   MCHC 32.0 32.0 - 36.0 g/dL   RBC 3.44 (L) 4.20 - 5.82 10e6/uL   RDW 18.0 (H) 11.0 - 14.6 %   lymph# 0.8 (L) 0.9 - 3.3 10e3/uL   MONO# 0.9 0.1 - 0.9 10e3/uL   Eosinophils Absolute 0.0 0.0 - 0.5 10e3/uL   Basophils Absolute 0.0 0.0 - 0.1 10e3/uL   NEUT% 79.3 (H) 39.0 - 75.0 %   LYMPH% 9.6 (L) 14.0 - 49.0  %   MONO% 10.9 0.0 - 14.0 %   EOS% 0.1 0.0 - 7.0 %   BASO% 0.1 0.0 - 2.0 %  Comprehensive metabolic panel  Result Value Ref Range   Sodium 141 136 - 145 mEq/L   Potassium 3.8 3.5 - 5.1 mEq/L   Chloride 104 98 - 109 mEq/L   CO2 24 22 - 29 mEq/L   Glucose 205 (H) 70 - 140 mg/dl   BUN 15.3 7.0 - 26.0 mg/dL   Creatinine 0.9 0.7 - 1.3 mg/dL   Total Bilirubin 0.85 0.20 - 1.20 mg/dL   Alkaline Phosphatase 97 40 - 150 U/L   AST 26 5 - 34 U/L   ALT 23 0 - 55 U/L   Total Protein 5.8 (L) 6.4 - 8.3 g/dL   Albumin 2.9 (L) 3.5 - 5.0 g/dL   Calcium 8.3 (L) 8.4 - 10.4 mg/dL   Anion Gap 13 (H) 3 - 11 mEq/L   EGFR 84 (L) >90 ml/min/1.73 m2      Assessment & Plan:   Problem List Items Addressed This Visit    Steroid-induced diabetes mellitus    cbg's remain elevated - will increase amaryl to 27m daily and monitor for low sugars. Foot exam today. Diabetic shoe forms filled today.      Relevant Medications   glimepiride (AMARYL) tablet   Recurrent left pleural effusion    Pending possible removal tomorrow.      Protein-calorie malnutrition    See above.      Pedal edema    Marked. Will increase lasix to 867mfor 3 days and monitor for effect. Work towards increased protein in diet (through [facility]     Medicare annual wellness visit, subsequent - Primary    I have personally reviewed the Medicare Annual Wellness questionnaire and have noted 1. The patient's medical and social history 2. Their use of alcohol, tobacco or illicit drugs 3. Their current medications and supplements 4. The patient's functional ability including ADL's, fall risks, home safety risks and hearing or visual impairment. 5. Diet and physical activity 6. Evidence for depression or mood disorders The patients weight, height, BMI have been recorded in the chart.  Hearing and vision has been addressed. I have made referrals, counseling and provided education to the patient based review  of the above and  I have provided the pt with a written personalized care plan for preventive services. Provider list updated - see scanned questionairre. Reviewed preventative protocols and updated unless pt declined.       Hypoalbuminemia    Encouraged trial glucerna. Discussed relation of low protein to edema.      HLD (hyperlipidemia)    Stable off meds. Intolerant to statins in past including RYR.      Essential hypertension    Chronic, stable. Continue regimen. Persistent dizziness - check orthostatics today.      Atrial fibrillation, unspecified    Continue eliquis. On hold for tomorrow's port a cath procedure and removal of pleurx.      Advanced care planning/counseling discussion    Advanced directives: living will in chart. Wouldn't want prolonged artificial life support. Wife Shawn Espinoza would be HCproxy      Adenocarcinoma of left lung, stage 4    Appreciate onc care.      Relevant Medications   methylPREDNISolone (MEDROL DOSEPAK) 4 MG tablet   Abnormal TSH    With normal free T4. Anticipate subclinical hypothyroidism - continue to monitor for now.          Follow up plan: Return as needed, for follow up visit.

## 2015-03-30 NOTE — Assessment & Plan Note (Signed)
Stable off meds. Intolerant to statins in past including RYR.

## 2015-03-30 NOTE — Assessment & Plan Note (Signed)
Advanced directives: living will in chart. Wouldn't want prolonged artificial life support. Wife Shawn Espinoza would be HCproxy

## 2015-03-30 NOTE — Assessment & Plan Note (Signed)
Pending possible removal tomorrow.

## 2015-03-30 NOTE — Assessment & Plan Note (Signed)

## 2015-03-30 NOTE — Progress Notes (Signed)
Pre visit review using our clinic review tool, if applicable. No additional management support is needed unless otherwise documented below in the visit note. 

## 2015-03-30 NOTE — Assessment & Plan Note (Signed)
Encouraged trial glucerna. Discussed relation of low protein to edema.

## 2015-03-30 NOTE — Assessment & Plan Note (Signed)
With normal free T4. Anticipate subclinical hypothyroidism - continue to monitor for now.

## 2015-03-31 ENCOUNTER — Ambulatory Visit (HOSPITAL_COMMUNITY): Payer: Medicare Other

## 2015-03-31 ENCOUNTER — Encounter (HOSPITAL_COMMUNITY)
Admission: RE | Disposition: A | Discharge status: Home / Self Care | Payer: Self-pay | Source: Ambulatory Visit | Attending: Cardiothoracic Surgery

## 2015-03-31 ENCOUNTER — Ambulatory Visit (HOSPITAL_COMMUNITY)
Admission: RE | Admit: 2015-03-31 | Discharge: 2015-03-31 | Disposition: A | Discharge status: Home / Self Care | Payer: Medicare Other | Source: Ambulatory Visit | Attending: Cardiothoracic Surgery | Admitting: Cardiothoracic Surgery

## 2015-03-31 ENCOUNTER — Encounter (HOSPITAL_COMMUNITY): Payer: Self-pay | Admitting: Anesthesiology

## 2015-03-31 ENCOUNTER — Ambulatory Visit (HOSPITAL_COMMUNITY): Payer: Medicare Other | Admitting: Vascular Surgery

## 2015-03-31 ENCOUNTER — Ambulatory Visit (HOSPITAL_COMMUNITY): Payer: Medicare Other | Admitting: Anesthesiology

## 2015-03-31 DIAGNOSIS — I509 Heart failure, unspecified: Secondary | ICD-10-CM | POA: Diagnosis not present

## 2015-03-31 DIAGNOSIS — C3492 Malignant neoplasm of unspecified part of left bronchus or lung: Secondary | ICD-10-CM | POA: Insufficient documentation

## 2015-03-31 DIAGNOSIS — C3412 Malignant neoplasm of upper lobe, left bronchus or lung: Secondary | ICD-10-CM | POA: Diagnosis not present

## 2015-03-31 DIAGNOSIS — F329 Major depressive disorder, single episode, unspecified: Secondary | ICD-10-CM | POA: Diagnosis not present

## 2015-03-31 DIAGNOSIS — C3490 Malignant neoplasm of unspecified part of unspecified bronchus or lung: Secondary | ICD-10-CM

## 2015-03-31 DIAGNOSIS — Z452 Encounter for adjustment and management of vascular access device: Secondary | ICD-10-CM

## 2015-03-31 DIAGNOSIS — I4891 Unspecified atrial fibrillation: Secondary | ICD-10-CM | POA: Insufficient documentation

## 2015-03-31 DIAGNOSIS — E119 Type 2 diabetes mellitus without complications: Secondary | ICD-10-CM | POA: Diagnosis not present

## 2015-03-31 DIAGNOSIS — Z6832 Body mass index (BMI) 32.0-32.9, adult: Secondary | ICD-10-CM | POA: Diagnosis not present

## 2015-03-31 DIAGNOSIS — K219 Gastro-esophageal reflux disease without esophagitis: Secondary | ICD-10-CM | POA: Diagnosis not present

## 2015-03-31 DIAGNOSIS — E669 Obesity, unspecified: Secondary | ICD-10-CM | POA: Insufficient documentation

## 2015-03-31 DIAGNOSIS — Z87891 Personal history of nicotine dependence: Secondary | ICD-10-CM | POA: Diagnosis not present

## 2015-03-31 DIAGNOSIS — J90 Pleural effusion, not elsewhere classified: Secondary | ICD-10-CM | POA: Diagnosis not present

## 2015-03-31 DIAGNOSIS — M1990 Unspecified osteoarthritis, unspecified site: Secondary | ICD-10-CM | POA: Diagnosis not present

## 2015-03-31 DIAGNOSIS — Z419 Encounter for procedure for purposes other than remedying health state, unspecified: Secondary | ICD-10-CM

## 2015-03-31 HISTORY — PX: PORTACATH PLACEMENT: SHX2246

## 2015-03-31 HISTORY — PX: REMOVAL OF PLEURAL DRAINAGE CATHETER: SHX5080

## 2015-03-31 LAB — CBC
HCT: 33.8 % — ABNORMAL LOW (ref 39.0–52.0)
Hemoglobin: 10.6 g/dL — ABNORMAL LOW (ref 13.0–17.0)
MCH: 31.1 pg (ref 26.0–34.0)
MCHC: 31.4 g/dL (ref 30.0–36.0)
MCV: 99.1 fL (ref 78.0–100.0)
Platelets: 186 10*3/uL (ref 150–400)
RBC: 3.41 MIL/uL — ABNORMAL LOW (ref 4.22–5.81)
RDW: 19.9 % — ABNORMAL HIGH (ref 11.5–15.5)
WBC: 6.2 10*3/uL (ref 4.0–10.5)

## 2015-03-31 LAB — PROTIME-INR
INR: 1.2 (ref 0.00–1.49)
Prothrombin Time: 15.4 seconds — ABNORMAL HIGH (ref 11.6–15.2)

## 2015-03-31 LAB — APTT: aPTT: 30 seconds (ref 24–37)

## 2015-03-31 LAB — GLUCOSE, CAPILLARY: Glucose-Capillary: 129 mg/dL — ABNORMAL HIGH (ref 70–99)

## 2015-03-31 SURGERY — INSERTION, TUNNELED CENTRAL VENOUS DEVICE, WITH PORT
Anesthesia: Monitor Anesthesia Care | Site: Chest | Laterality: Right

## 2015-03-31 MED ORDER — SODIUM CHLORIDE 0.9 % IR SOLN
Status: DC | PRN
  Administered 2015-03-31: 15:00:00

## 2015-03-31 MED ORDER — HEPARIN SOD (PORK) LOCK FLUSH 100 UNIT/ML IV SOLN
INTRAVENOUS | Status: DC | PRN
  Administered 2015-03-31: 500 [IU] via INTRAVENOUS

## 2015-03-31 MED ORDER — HEPARIN SODIUM (PORCINE) 1000 UNIT/ML IJ SOLN
INTRAMUSCULAR | Status: AC
  Filled 2015-03-31: qty 1

## 2015-03-31 MED ORDER — MIDAZOLAM HCL 5 MG/5ML IJ SOLN
INTRAMUSCULAR | Status: DC | PRN
  Administered 2015-03-31 (×2): 1 mg via INTRAVENOUS

## 2015-03-31 MED ORDER — ACETAMINOPHEN 325 MG PO TABS: 325.0000 mg | ORAL_TABLET | ORAL | Status: DC | PRN

## 2015-03-31 MED ORDER — LIDOCAINE HCL (PF) 1 % IJ SOLN
INTRAMUSCULAR | Status: AC
Start: 2015-03-31 — End: 2015-03-31
  Filled 2015-03-31: qty 30

## 2015-03-31 MED ORDER — FENTANYL CITRATE 0.05 MG/ML IJ SOLN
INTRAMUSCULAR | Status: AC
  Filled 2015-03-31: qty 5

## 2015-03-31 MED ORDER — PROPOFOL 10 MG/ML IV BOLUS
INTRAVENOUS | Status: AC
Start: 2015-03-31 — End: 2015-03-31
  Filled 2015-03-31: qty 20

## 2015-03-31 MED ORDER — ACETAMINOPHEN 650 MG RE SUPP: 650.0000 mg | RECTAL | Status: DC | PRN

## 2015-03-31 MED ORDER — LIDOCAINE-EPINEPHRINE (PF) 1 %-1:200000 IJ SOLN
INTRAMUSCULAR | Status: AC
  Filled 2015-03-31: qty 10

## 2015-03-31 MED ORDER — SODIUM CHLORIDE 0.9 % IJ SOLN: 3.0000 mL | INTRAMUSCULAR | Status: DC | PRN

## 2015-03-31 MED ORDER — HEPARIN SOD (PORK) LOCK FLUSH 100 UNIT/ML IV SOLN
INTRAVENOUS | Status: AC
  Filled 2015-03-31: qty 5

## 2015-03-31 MED ORDER — FENTANYL CITRATE 0.05 MG/ML IJ SOLN
INTRAMUSCULAR | Status: DC | PRN
Start: 2015-03-31 — End: 2015-03-31
  Administered 2015-03-31 (×4): 50 ug via INTRAVENOUS

## 2015-03-31 MED ORDER — LIDOCAINE HCL (CARDIAC) 20 MG/ML IV SOLN
INTRAVENOUS | Status: DC | PRN
  Administered 2015-03-31: 60 mg via INTRAVENOUS

## 2015-03-31 MED ORDER — MINERAL OIL LIGHT 100 % EX OIL
TOPICAL_OIL | CUTANEOUS | Status: AC
  Filled 2015-03-31: qty 25

## 2015-03-31 MED ORDER — ACETAMINOPHEN 160 MG/5ML PO SOLN
325.0000 mg | ORAL | Status: DC | PRN
  Filled 2015-03-31: qty 20.3

## 2015-03-31 MED ORDER — LIDOCAINE HCL (PF) 1 % IJ SOLN
INTRAMUSCULAR | Status: DC | PRN
  Administered 2015-03-31: 30 mL

## 2015-03-31 MED ORDER — OXYCODONE HCL 5 MG/5ML PO SOLN: 5.0000 mg | Freq: Once | ORAL | Status: AC | PRN

## 2015-03-31 MED ORDER — ROCURONIUM BROMIDE 50 MG/5ML IV SOLN
INTRAVENOUS | Status: AC
  Filled 2015-03-31: qty 1

## 2015-03-31 MED ORDER — MIDAZOLAM HCL 2 MG/2ML IJ SOLN
INTRAMUSCULAR | Status: AC
  Filled 2015-03-31: qty 2

## 2015-03-31 MED ORDER — OXYCODONE HCL 5 MG PO TABS: 5.0000 mg | ORAL_TABLET | ORAL | Status: DC | PRN

## 2015-03-31 MED ORDER — LIDOCAINE HCL (CARDIAC) 20 MG/ML IV SOLN
INTRAVENOUS | Status: AC
  Filled 2015-03-31: qty 5

## 2015-03-31 MED ORDER — SODIUM CHLORIDE 0.9 % IJ SOLN: 3.0000 mL | Freq: Two times a day (BID) | INTRAMUSCULAR | Status: DC

## 2015-03-31 MED ORDER — LACTATED RINGERS IV SOLN
INTRAVENOUS | Status: DC
  Administered 2015-03-31: 14:00:00 via INTRAVENOUS

## 2015-03-31 MED ORDER — SODIUM CHLORIDE 0.9 % IR SOLN
Status: DC | PRN
  Administered 2015-03-31: 1

## 2015-03-31 MED ORDER — FENTANYL CITRATE 0.05 MG/ML IJ SOLN: 25.0000 ug | INTRAMUSCULAR | Status: DC | PRN

## 2015-03-31 MED ORDER — SODIUM CHLORIDE 0.9 % IV SOLN: 250.0000 mL | INTRAVENOUS | Status: DC | PRN

## 2015-03-31 MED ORDER — ONDANSETRON HCL 4 MG/2ML IJ SOLN
INTRAMUSCULAR | Status: AC
Start: 2015-03-31 — End: 2015-03-31
  Filled 2015-03-31: qty 2

## 2015-03-31 MED ORDER — OXYCODONE HCL 5 MG PO TABS
5.0000 mg | ORAL_TABLET | Freq: Once | ORAL | Status: AC | PRN
Start: 2015-03-31 — End: 2015-03-31

## 2015-03-31 MED ORDER — PROPOFOL 10 MG/ML IV BOLUS
INTRAVENOUS | Status: DC | PRN
  Administered 2015-03-31 (×2): 10 mg via INTRAVENOUS
  Administered 2015-03-31 (×4): 20 mg via INTRAVENOUS

## 2015-03-31 MED ORDER — LACTATED RINGERS IV SOLN
INTRAVENOUS | Status: DC | PRN
  Administered 2015-03-31: 15:00:00 via INTRAVENOUS

## 2015-03-31 MED ORDER — ACETAMINOPHEN 325 MG PO TABS: 650.0000 mg | ORAL_TABLET | ORAL | Status: DC | PRN

## 2015-03-31 SURGICAL SUPPLY — 47 items
APL SKNCLS STERI-STRIP NONHPOA (GAUZE/BANDAGES/DRESSINGS) ×2
BAG DECANTER FOR FLEXI CONT (MISCELLANEOUS) ×4 IMPLANT
BENZOIN TINCTURE PRP APPL 2/3 (GAUZE/BANDAGES/DRESSINGS) ×2 IMPLANT
BLADE SURG 11 STRL SS (BLADE) ×4 IMPLANT
CANISTER SUCTION 2500CC (MISCELLANEOUS) ×4 IMPLANT
CLOSURE WOUND 1/2 X4 (GAUZE/BANDAGES/DRESSINGS)
COVER SURGICAL LIGHT HANDLE (MISCELLANEOUS) ×8 IMPLANT
DRAPE C-ARM 42X72 X-RAY (DRAPES) ×4 IMPLANT
DRAPE CHEST BREAST 15X10 FENES (DRAPES) ×4 IMPLANT
DRAPE LAPAROTOMY TRNSV 102X78 (DRAPE) ×4 IMPLANT
ELECT CAUTERY BLADE 6.4 (BLADE) ×4 IMPLANT
ELECT REM PT RETURN 9FT ADLT (ELECTROSURGICAL) ×4
ELECTRODE REM PT RTRN 9FT ADLT (ELECTROSURGICAL) ×2 IMPLANT
GAUZE SPONGE 4X4 12PLY STRL (GAUZE/BANDAGES/DRESSINGS) ×4 IMPLANT
GAUZE XEROFORM 1X8 LF (GAUZE/BANDAGES/DRESSINGS) ×2 IMPLANT
GLOVE BIO SURGEON STRL SZ7.5 (GLOVE) ×8 IMPLANT
GOWN STRL REUS W/ TWL LRG LVL3 (GOWN DISPOSABLE) ×2 IMPLANT
GOWN STRL REUS W/TWL LRG LVL3 (GOWN DISPOSABLE) ×4
GUIDEWIRE UNCOATED ST S 7038 (WIRE) IMPLANT
INTRODUCER 13FR (MISCELLANEOUS) IMPLANT
INTRODUCER COOK 11FR (CATHETERS) IMPLANT
KIT BASIN OR (CUSTOM PROCEDURE TRAY) ×4 IMPLANT
KIT PORT POWER 9.6FR MRI PREA (Catheter) IMPLANT
KIT POWER CATH 8FR (Catheter) ×2 IMPLANT
KIT ROOM TURNOVER OR (KITS) ×4 IMPLANT
NDL 18GX1X1/2 (RX/OR ONLY) (NEEDLE) ×2 IMPLANT
NDL 25GX 5/8IN NON SAFETY (NEEDLE) ×2 IMPLANT
NEEDLE 18GX1X1/2 (RX/OR ONLY) (NEEDLE) ×4 IMPLANT
NEEDLE 22X1 1/2 (OR ONLY) (NEEDLE) ×4 IMPLANT
NEEDLE 25GX 5/8IN NON SAFETY (NEEDLE) ×4 IMPLANT
NS IRRIG 1000ML POUR BTL (IV SOLUTION) ×4 IMPLANT
PACK GENERAL/GYN (CUSTOM PROCEDURE TRAY) ×4 IMPLANT
PAD ARMBOARD 7.5X6 YLW CONV (MISCELLANEOUS) ×8 IMPLANT
SET SHEATH INTRODUCER 10FR (MISCELLANEOUS) IMPLANT
SPONGE GAUZE 4X4 12PLY STER LF (GAUZE/BANDAGES/DRESSINGS) ×4 IMPLANT
STRIP CLOSURE SKIN 1/2X4 (GAUZE/BANDAGES/DRESSINGS) IMPLANT
SUT ETHILON 3 0 FSL (SUTURE) ×4 IMPLANT
SUT VIC AB 3-0 SH 8-18 (SUTURE) ×2 IMPLANT
SUT VIC AB 3-0 X1 27 (SUTURE) ×4 IMPLANT
SYR 20CC LL (SYRINGE) ×4 IMPLANT
SYR CONTROL 10ML LL (SYRINGE) ×4 IMPLANT
SYRINGE 10CC LL (SYRINGE) ×4 IMPLANT
TAPE CLOTH SURG 4X10 WHT LF (GAUZE/BANDAGES/DRESSINGS) ×4 IMPLANT
TAPE STRIPS DRAPE STRL (GAUZE/BANDAGES/DRESSINGS) ×2 IMPLANT
TOWEL OR 17X24 6PK STRL BLUE (TOWEL DISPOSABLE) ×4 IMPLANT
TOWEL OR 17X26 10 PK STRL BLUE (TOWEL DISPOSABLE) ×4 IMPLANT
WATER STERILE IRR 1000ML POUR (IV SOLUTION) ×4 IMPLANT

## 2015-03-31 NOTE — Anesthesia Preprocedure Evaluation (Signed)
Anesthesia Evaluation  Patient identified by MRN, date of birth, ID band Patient awake    Reviewed: Allergy & Precautions, NPO status , Patient's Chart, lab work & pertinent test results  History of Anesthesia Complications Negative for: history of anesthetic complications  Airway Mallampati: III  TM Distance: >3 FB Neck ROM: Full    Dental  (+) Teeth Intact   Pulmonary shortness of breath, former smoker,  Malignant effusion   + decreased breath sounds      Cardiovascular hypertension, Pt. on medications +CHF and + DOE + dysrhythmias Atrial Fibrillation + Valvular Problems/Murmurs AS Rhythm:Irregular     Neuro/Psych PSYCHIATRIC DISORDERS Depression negative neurological ROS     GI/Hepatic   Endo/Other  diabetes, Type 2, Oral Hypoglycemic AgentsMorbid obesity  Renal/GU      Musculoskeletal  (+) Arthritis -,   Abdominal   Peds  Hematology  (+) anemia ,   Anesthesia Other Findings   Reproductive/Obstetrics                             Anesthesia Physical Anesthesia Plan  ASA: III  Anesthesia Plan: MAC   Post-op Pain Management:    Induction: Intravenous  Airway Management Planned: Natural Airway  Additional Equipment: None  Intra-op Plan:   Post-operative Plan:   Informed Consent: I have reviewed the patients History and Physical, chart, labs and discussed the procedure including the risks, benefits and alternatives for the proposed anesthesia with the patient or authorized representative who has indicated his/her understanding and acceptance.   Dental advisory given  Plan Discussed with: CRNA and Surgeon  Anesthesia Plan Comments:         Anesthesia Quick Evaluation

## 2015-03-31 NOTE — Progress Notes (Signed)
The patient was examined and preop studies reviewed. There has been no change from the prior exam and the patient is ready for surgery.   Plan placement of portacath and removal of pleurx catheter on Shawn Espinoza

## 2015-03-31 NOTE — Progress Notes (Signed)
Pt. Had CMET- 03/27/2015, will d/c other CMET order.

## 2015-03-31 NOTE — Transfer of Care (Signed)
Immediate Anesthesia Transfer of Care Note  Patient: Shawn Espinoza  Procedure(s) Performed: Procedure(s): INSERTION PORT-A-CATH (Right) REMOVAL OF PLEURAL DRAINAGE CATHETER (Left)  Patient Location: PACU  Anesthesia Type:MAC  Level of Consciousness: awake, alert , oriented and patient cooperative  Airway & Oxygen Therapy: Patient Spontanous Breathing  Post-op Assessment: Report given to RN, Post -op Vital signs reviewed and stable and Patient moving all extremities  Post vital signs: Reviewed and stable  Last Vitals:  Filed Vitals:   03/31/15 1233  BP: 152/97  Pulse: 93  Temp: 36.7 C  Resp: 18    Complications: No apparent anesthesia complications

## 2015-03-31 NOTE — Brief Op Note (Signed)
03/31/2015  4:30 PM  PATIENT:  Elisabeth Most  74 y.o. male  PRE-OPERATIVE DIAGNOSIS:  LUNG CANCER  POST-OPERATIVE DIAGNOSIS:  lung cancer  PROCEDURE:  Procedure(s): INSERTION PORT-A-CATH (Right) REMOVAL OF PLEURAL DRAINAGE CATHETER (Left)  SURGEON:  Surgeon(s) and Role:    * Ivin Poot, MD - Primary  PHYSICIAN ASSISTANT:   ASSISTANTS: none   ANESTHESIA:   local  EBL:  Total I/O In: 600 [I.V.:600] Out: -   BLOOD ADMINISTERED:none  DRAINS: none   LOCAL MEDICATIONS USED:  LIDOCAINE  and Amount: 14 ml  SPECIMEN:  No Specimen  DISPOSITION OF SPECIMEN:  N/A  COUNTS:  YES  TOURNIQUET:  * No tourniquets in log *  DICTATION: .Dragon Dictation  PLAN OF CARE: Discharge to home after PACU  PATIENT DISPOSITION:  PACU - hemodynamically stable.   Delay start of Pharmacological VTE agent (>24hrs) due to surgical blood loss or risk of bleeding: yes- hold eliquis 2 days

## 2015-04-03 NOTE — Anesthesia Postprocedure Evaluation (Signed)
  Anesthesia Post-op Note  Patient: Shawn Espinoza  Procedure(s) Performed: Procedure(s): INSERTION PORT-A-CATH (Right) REMOVAL OF PLEURAL DRAINAGE CATHETER (Left)  Patient Location: PACU  Anesthesia Type:MAC  Level of Consciousness: awake  Airway and Oxygen Therapy: Patient Spontanous Breathing  Post-op Pain: none  Post-op Assessment: Post-op Vital signs reviewed, Patient's Cardiovascular Status Stable, Respiratory Function Stable, Patent Airway, No signs of Nausea or vomiting and Pain level controlled  Post-op Vital Signs: Reviewed and stable  Last Vitals:  Filed Vitals:   03/31/15 1715  BP: 129/83  Pulse: 96  Temp:   Resp: 24    Complications: No apparent anesthesia complications

## 2015-04-03 NOTE — Op Note (Signed)
NAME:  Shawn Espinoza, Shawn Espinoza NO.:  [id number]  MEDICAL RECORD NO.:  [id number]  LOCATION:                                 FACILITY:  PHYSICIAN:  Ivin Poot, M.D.  DATE OF BIRTH:  Jul 02, 1941  DATE OF PROCEDURE:  03/31/2015 DATE OF DISCHARGE:  03/31/2015                              OPERATIVE REPORT   OPERATION: 1. Placement of right subclavian vein Port-A-Cath catheter. 2. Removal of left PleurX catheter.  SURGEON:  Tharon Aquas Trigt MD.  PREOPERATIVE DIAGNOSIS:  Advanced link lung cancer, resolved left malignant pleural effusion.  POSTOPERATIVE DIAGNOSIS:  Advanced link lung cancer, resolved left malignant pleural effusion.  ANESTHESIA:  Monitored IV conscious sedation by anesthesia and local 1% lidocaine.  PROCEDURE:  After the above procedures were discussed in detail with the patient and his wife in the office, an informed consent was obtained. The patient was brought to the operating room and placed supine on the operating table.  The right and left chest were prepped and draped as a sterile field.  A proper time-out was performed.  Lidocaine 1% was infiltrated beneath the right clavicle and in the midclavicular line for placement of Port-A-Cath.  A small incision was made beneath the right clavicle and using the Seldinger technique, a guidewire was passed into the superior vena cava and confirmed by C-arm fluoroscopy.  A second incision was made for the reservoir placement in the midclavicular line and in the subcutaneous tissue.  The Power Port-A-Cath was then tunneled from the lower incision to the upper incision and trimmed to 26 cm.  The guidewire was used to place the dilator sheath system into the superior vena cava.  Through the tear-away sheath, the Port-A-Cath was then advanced.  It flushed and aspirated blood easily.  It was filled with a concentrated heparin solution.  The 2 incisions were closed using standard techniques.  Next, attention  was directed to the left PleurX catheter which had not drained any fluid in several weeks.  The exit site was anesthetized with local 1% lidocaine.  The exit site was extended with a scalpel.  The subcutaneous tissue was carefully dissected off the PleurX to expose the cuff.  A fibrous sheath around the silastic catheter was also carefully divided to allow the catheter to be pulled in its entirety.  The incision was irrigated with sterile saline.  It was closed with interrupted 3-0 Vicryl for the subcutaneous layer and interrupted 3-0 nylon for the skin.  Sterile dressings were applied.  Chest x-ray was taken in the operating room, and showed the Port-A-Cath to be in good position without pneumothorax.  The patient then returned to the recovery room.     Ivin Poot, M.D.     PV/MEDQ  D:  03/31/2015  T:  04/01/2015  Job:  [id number]  cc:   [name]

## 2015-04-04 ENCOUNTER — Other Ambulatory Visit (HOSPITAL_BASED_OUTPATIENT_CLINIC_OR_DEPARTMENT_OTHER): Payer: Medicare Other

## 2015-04-04 ENCOUNTER — Ambulatory Visit (HOSPITAL_BASED_OUTPATIENT_CLINIC_OR_DEPARTMENT_OTHER): Payer: Medicare Other | Admitting: Physician Assistant

## 2015-04-04 ENCOUNTER — Telehealth: Payer: Self-pay

## 2015-04-04 ENCOUNTER — Encounter (HOSPITAL_COMMUNITY): Payer: Self-pay | Admitting: Cardiothoracic Surgery

## 2015-04-04 ENCOUNTER — Ambulatory Visit (HOSPITAL_BASED_OUTPATIENT_CLINIC_OR_DEPARTMENT_OTHER): Payer: Medicare Other

## 2015-04-04 ENCOUNTER — Ambulatory Visit: Payer: Medicare Other

## 2015-04-04 VITALS — BP 133/87 | HR 98 | Temp 97.9°F | Resp 18 | Ht 67.5 in | Wt 210.6 lb

## 2015-04-04 DIAGNOSIS — C3412 Malignant neoplasm of upper lobe, left bronchus or lung: Secondary | ICD-10-CM | POA: Diagnosis not present

## 2015-04-04 DIAGNOSIS — C3490 Malignant neoplasm of unspecified part of unspecified bronchus or lung: Secondary | ICD-10-CM

## 2015-04-04 DIAGNOSIS — Z5111 Encounter for antineoplastic chemotherapy: Secondary | ICD-10-CM | POA: Diagnosis not present

## 2015-04-04 DIAGNOSIS — I10 Essential (primary) hypertension: Secondary | ICD-10-CM

## 2015-04-04 DIAGNOSIS — R530 Neoplastic (malignant) related fatigue: Secondary | ICD-10-CM

## 2015-04-04 DIAGNOSIS — J90 Pleural effusion, not elsewhere classified: Secondary | ICD-10-CM

## 2015-04-04 DIAGNOSIS — C3492 Malignant neoplasm of unspecified part of left bronchus or lung: Secondary | ICD-10-CM

## 2015-04-04 DIAGNOSIS — R609 Edema, unspecified: Secondary | ICD-10-CM

## 2015-04-04 DIAGNOSIS — Z95828 Presence of other vascular implants and grafts: Secondary | ICD-10-CM

## 2015-04-04 DIAGNOSIS — I4891 Unspecified atrial fibrillation: Secondary | ICD-10-CM

## 2015-04-04 LAB — CBC WITH DIFFERENTIAL/PLATELET
BASO%: 0 % (ref 0.0–2.0)
BASOS ABS: 0 10*3/uL (ref 0.0–0.1)
EOS ABS: 0 10*3/uL (ref 0.0–0.5)
EOS%: 0 % (ref 0.0–7.0)
HEMATOCRIT: 34.6 % — AB (ref 38.4–49.9)
HGB: 11 g/dL — ABNORMAL LOW (ref 13.0–17.1)
LYMPH#: 0.3 10*3/uL — AB (ref 0.9–3.3)
LYMPH%: 3.3 % — ABNORMAL LOW (ref 14.0–49.0)
MCH: 31.5 pg (ref 27.2–33.4)
MCHC: 31.8 g/dL — ABNORMAL LOW (ref 32.0–36.0)
MCV: 99.1 fL — ABNORMAL HIGH (ref 79.3–98.0)
MONO#: 0.4 10*3/uL (ref 0.1–0.9)
MONO%: 3.5 % (ref 0.0–14.0)
NEUT#: 9.6 10*3/uL — ABNORMAL HIGH (ref 1.5–6.5)
NEUT%: 93.2 % — ABNORMAL HIGH (ref 39.0–75.0)
PLATELETS: 285 10*3/uL (ref 140–400)
RBC: 3.49 10*6/uL — ABNORMAL LOW (ref 4.20–5.82)
RDW: 18.8 % — ABNORMAL HIGH (ref 11.0–14.6)
WBC: 10.3 10*3/uL (ref 4.0–10.3)

## 2015-04-04 LAB — COMPREHENSIVE METABOLIC PANEL (CC13)
ALT: 19 U/L (ref 0–55)
ANION GAP: 10 meq/L (ref 3–11)
AST: 20 U/L (ref 5–34)
Albumin: 3 g/dL — ABNORMAL LOW (ref 3.5–5.0)
Alkaline Phosphatase: 73 U/L (ref 40–150)
BILIRUBIN TOTAL: 0.51 mg/dL (ref 0.20–1.20)
BUN: 21.9 mg/dL (ref 7.0–26.0)
CHLORIDE: 103 meq/L (ref 98–109)
CO2: 27 mEq/L (ref 22–29)
Calcium: 8.6 mg/dL (ref 8.4–10.4)
Creatinine: 0.8 mg/dL (ref 0.7–1.3)
EGFR: 87 mL/min/{1.73_m2} — ABNORMAL LOW (ref >90)
Glucose: 247 mg/dl — ABNORMAL HIGH (ref 70–140)
Potassium: 3.9 mEq/L (ref 3.5–5.1)
SODIUM: 141 meq/L (ref 136–145)
TOTAL PROTEIN: 6 g/dL — AB (ref 6.4–8.3)

## 2015-04-04 MED ORDER — LIDOCAINE-PRILOCAINE 2.5-2.5 % EX CREA: 1.0000 "application " | TOPICAL_CREAM | CUTANEOUS | Status: DC | PRN

## 2015-04-04 MED ORDER — SODIUM CHLORIDE 0.9 % IJ SOLN
10.0000 mL | INTRAMUSCULAR | Status: DC | PRN
  Administered 2015-04-04: 10 mL via INTRAVENOUS
  Filled 2015-04-04: qty 10

## 2015-04-04 MED ORDER — HEPARIN SOD (PORK) LOCK FLUSH 100 UNIT/ML IV SOLN
500.0000 [IU] | Freq: Once | INTRAVENOUS | Status: AC | PRN
  Administered 2015-04-04: 500 [IU]
  Filled 2015-04-04: qty 5

## 2015-04-04 MED ORDER — SODIUM CHLORIDE 0.9 % IJ SOLN
10.0000 mL | INTRAMUSCULAR | Status: DC | PRN
  Administered 2015-04-04: 10 mL
  Filled 2015-04-04: qty 10

## 2015-04-04 MED ORDER — SODIUM CHLORIDE 0.9 % IV SOLN
Freq: Once | INTRAVENOUS | Status: AC
  Administered 2015-04-04: 16:00:00 via INTRAVENOUS

## 2015-04-04 MED ORDER — SODIUM CHLORIDE 0.9 % IV SOLN
470.0000 mg/m2 | Freq: Once | INTRAVENOUS | Status: AC
  Administered 2015-04-04: 1000 mg via INTRAVENOUS
  Filled 2015-04-04: qty 40

## 2015-04-04 MED ORDER — SODIUM CHLORIDE 0.9 % IV SOLN
Freq: Once | INTRAVENOUS | Status: AC
  Administered 2015-04-04: 16:00:00 via INTRAVENOUS
  Filled 2015-04-04: qty 4

## 2015-04-04 MED ORDER — FUROSEMIDE 40 MG PO TABS: 80.0000 mg | ORAL_TABLET | Freq: Every day | ORAL | Status: DC

## 2015-04-04 NOTE — Telephone Encounter (Signed)
Pts wife left v/m; pt taking Lasix 80 mg and seems to be helping some but thinks may need higher dosage pt said swelling around waist and legs are maybe 5 % better; pt took Zoloft x 2 days and pt slept for 2 days; pt stopped the  Zoloft; pt is having chemo 04/04/15 and pt is starting the Glimepiride 2 mg today. Pt wants to know about increasing the Lasix. Request cb.[facility] [address]

## 2015-04-04 NOTE — Telephone Encounter (Signed)
Noted. Let's continue lasix 80mg  daily 8am with option to take 40mg  2nd dose around noon or 1pm prn swelling.  New dose sent to pharmacy. Return at end of week for kidney and K check.

## 2015-04-04 NOTE — Patient Instructions (Signed)

## 2015-04-04 NOTE — Progress Notes (Addendum)
[place] [phone]   Fax:([phone] [place], [place] [address]  DIAGNOSIS: Stage IIIB/IV non-small cell lung cancer, adenocarcinoma diagnosed in March of 2015.  PRIOR THERAPY:  1) Systemic chemotherapy with carboplatin for AUC of 5 and Alimta 500 mg/M2 every 3 weeks. First dose expected on 04/13/2014. Status post 6 cycles. 2)  Maintenance chemotherapy with single agent Alimta 500 mg/M2 every 3 weeks. First cycle on 09/06/2014. Status post 1 cycle. 3) Second course of systemic chemotherapy with carboplatin for AUC of 5 and Alimta 500 MG/M2 every 3 weeks. First cycle 11/08/2014. Status post 6 cycles.  CURRENT THERAPY: Maintenance chemotherapy with single agent Alimta 500 mg/M2 every 3 weeks. Status post 1 cycle  INTERVAL HISTORY: Shawn Espinoza 74 y.o. male returns to the clinic today for follow up visit. He is complaining of swelling from his waist to his feet. He has been on an increased dose of Lasix for the past 3 days at 80 mg with some slight improvement. He continues to have shortness of breath with exertion, even minimal exertion of ADLs. He is tolerating his treatment with single agent Alimta well with no significant adverse effects. He denied having any significant chest pain but continues to have shortness of breath and mild cough with no hemoptysis. He has no significant fever or chills, no nausea or vomiting. He complains of leg weakness particularly in his thigh muscles. He finds it difficult to climb stairs. He denied any numbness or tingling in his back or into the lower extremities, and has full control of his bowels and bladder He presents for cycle #2 of maintenance Alimta.  MEDICAL HISTORY: Past Medical History  Diagnosis Date  . History of diabetes mellitus, type II     resolved with diet, h/o neuropathy  . Diverticulosis of colon   . Hyperlipidemia   .  Hypertension   . Fatty liver 02/29/00    abd ultrasound  . Lower back pain   . Systolic murmur 6222    2Decho - normal LV fxn, EF 55%, mild AS, biatrial enlargement  . History of tobacco use quit 1990s  . 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 Personal history of colonic adenomas 07/06/2013  . Hypertensive retinopathy of both eyes 2015    mild  . 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 Dysrhythmia     Atrial fib (found 07/2014)  . Shortness of breath   . Pneumonia   . GERD (gastroesophageal reflux disease)     occasional  . Arthritis   . Neuropathy   . Constipation   . 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 Diabetes mellitus without complication     Type 2  . Depression     ALLERGIES:  is allergic to candesartan cilexetil; diltiazem hcl; doxazosin mesylate; nifedipine; pravastatin; red yeast rice; rosuvastatin; sertraline; and simvastatin.  MEDICATIONS:  Current Outpatient Prescriptions  Medication Sig Dispense Refill  . albuterol (PROVENTIL HFA;VENTOLIN HFA) 108 (90 BASE) MCG/ACT inhaler Inhale 2 puffs into the lungs every 6 (six) hours as needed for wheezing or shortness of breath. 1 Inhaler 0  . Alum & Mag Hydroxide-Simeth (MAGIC MOUTHWASH W/LIDOCAINE) SOLN Take 5 mLs by  mouth 3 (three) times daily as needed for mouth pain. 5 mL 0  . apixaban (ELIQUIS) 5 MG TABS tablet Take 1 tablet (5 mg total) by mouth 2 (two) times daily. 60 tablet 6  . Blood Glucose Monitoring Suppl (ONE TOUCH ULTRA SYSTEM KIT) W/DEVICE KIT Use as directed to check blood sugar Dx: E09.9 1 each 0  . cetirizine (ZYRTEC) 10 MG tablet Take 10 mg by mouth daily as needed for allergies or rhinitis.     Marland Kitchen dexamethasone (DECADRON) 4 MG tablet 4 MG BY MOUTH TWICE A DAY THE DAY BEFORE, DAY OF AND DAY AFTER THE CHEMOTHERAPY EVERY 3 WEEKS.  (Patient taking differently: 4 MG BY MOUTH TWICE A DAY THE DAY BEFORE, DAY OF AND DAY AFTER THE CHEMOTHERAPY AND THEN ONCE DAILY THEREAFTER.) 40 tablet 1  . diphenoxylate-atropine (LOMOTIL) 2.5-0.025 MG per tablet Take 2 tablets by mouth 4 (four) times daily as needed for diarrhea or loose stools. 30 tablet 0  . folic acid (FOLVITE) 1 MG tablet TAKE 1 TABLET BY MOUTH EVERY DAY 30 tablet 4  . glucose blood test strip Use as instructed to check blood sugar once daily and as needed. Dx: E09.9 200 each 3  . HYDROcodone-acetaminophen (NORCO/VICODIN) 5-325 MG per tablet Take 1 tablet by mouth every 6 (six) hours as needed for moderate pain.    . Lancets MISC Use to check sugar once daily and as needed Dx: K81.2 **ONE TOUCH DELICA** 751 each 3  . LORazepam (ATIVAN) 0.5 MG tablet Take 0.5 mg by mouth 2 (two) times daily as needed for anxiety.   0  . methylPREDNISolone (MEDROL DOSEPAK) 4 MG tablet Take 4 mg by mouth as directed. follow package directions    . metoprolol tartrate (LOPRESSOR) 25 MG tablet Take 1 tablet (25 mg total) by mouth 2 (two) times daily. 60 tablet 6  . Multiple Vitamins-Minerals (ICAPS) CAPS Take by mouth 2 (two) times daily.    Marland Kitchen nystatin (MYCOSTATIN) 100000 UNIT/ML suspension Take 5 mLs (500,000 Units total) by mouth 3 (three) times daily. 120 mL 0  . polyethylene glycol (MIRALAX / GLYCOLAX) packet Take 17 g by mouth daily.     . potassium gluconate 595 MG TABS tablet Take 595 mg by mouth daily.    . sertraline (ZOLOFT) 25 MG tablet Take 1 tablet (25 mg total) by mouth daily. 30 tablet 3  . sucralfate (CARAFATE) 1 GM/10ML suspension Take 1 g by mouth 4 (four) times daily as needed (reflux).    . furosemide (LASIX) 80 MG tablet   6  . glimepiride (AMARYL) 1 MG tablet   11  . lidocaine-prilocaine (EMLA) cream Apply 1 application topically as needed. 30 g 1  . traMADol (ULTRAM) 50 MG tablet   0   No current facility-administered medications for this visit.   Facility-Administered  Medications Ordered in Other Visits  Medication Dose Route Frequency Provider Last Rate Last Dose  . sodium chloride 0.9 % injection 10 mL  10 mL Intracatheter PRN Curt Bears, MD   10 mL at 04/04/15 1633    SURGICAL HISTORY:  Past Surgical History  Procedure Laterality Date  . Colonoscopy  2004  . Colonoscopy  2014    tubular adenoma x1, mod diverticulosis ([facility])  . Flexible bronchoscopy  02/2014    WNL  . Video bronchoscopy Bilateral 03/17/2014    Procedure: VIDEO BRONCHOSCOPY WITH FLUORO;  Surgeon: Tanda Rockers, MD;  Location: [facility];  Service: Cardiopulmonary;  Laterality: Bilateral;  . Chest tube  insertion Left 08/26/2014    Procedure: INSERTION OF LEFT  PLEURAL DRAINAGE CATHETER;  Surgeon: Ivin Poot, MD;  Location: [facility];  Service: Thoracic;  Laterality: Left;  . Cataract extraction Right 03/2015    Dr [name]  . Portacath placement Right 03/31/2015    Procedure: INSERTION PORT-A-CATH;  Surgeon: Ivin Poot, MD;  Location: [facility];  Service: Thoracic;  Laterality: Right;  . Removal of pleural drainage catheter Left 03/31/2015    Procedure: REMOVAL OF PLEURAL DRAINAGE CATHETER;  Surgeon: Ivin Poot, MD;  Location: [facility];  Service: Thoracic;  Laterality: Left;    REVIEW OF SYSTEMS:  A comprehensive review of systems was negative except for: Constitutional: positive for fatigue Respiratory: positive for dyspnea on exertion Cardiovascular: positive for lower extremity edema Musculoskeletal: positive for muscle weakness and affecting his thighs Neurological: positive for weakness   PHYSICAL EXAMINATION: General appearance: alert, cooperative, fatigued, no distress and Cushionoid appearance Head: Normocephalic, without obvious abnormality, atraumatic Neck: no adenopathy, no JVD, supple, symmetrical, trachea midline and thyroid not enlarged, symmetric, no tenderness/mass/nodules Lymph nodes: Cervical, supraclavicular, and axillary nodes normal. Resp: diminished  breath sounds LLL and dullness to percussion LLL Back: symmetric, no curvature. ROM normal. No CVA tenderness. Cardio: regular rate and rhythm, S1, S2 normal, no murmur, click, rub or gallop GI: soft, non-tender; bowel sounds normal; no masses,  no organomegaly Extremities: edema 2+ Neurologic: Alert and oriented X 3, normal strength and tone. Normal symmetric reflexes. Normal coordination and gait  ECOG PERFORMANCE STATUS: 1 - Symptomatic but completely ambulatory  Blood pressure 133/87, pulse 98, temperature 97.9 F (36.6 C), temperature source Oral, resp. rate 18, height 5' 7.5" (1.715 m), weight 210 lb 9.6 oz (95.528 kg), SpO2 97 %.  LABORATORY DATA: Lab Results  Component Value Date   WBC 10.3 04/04/2015   HGB 11.0* 04/04/2015   HCT 34.6* 04/04/2015   MCV 99.1* 04/04/2015   PLT 285 04/04/2015      Chemistry      Component Value Date/Time   NA 141 04/04/2015 1359   NA 137 03/22/2015 0808   K 3.9 04/04/2015 1359   K 4.3 03/22/2015 0808   CL 101 03/22/2015 0808   CO2 27 04/04/2015 1359   CO2 29 03/22/2015 0808   BUN 21.9 04/04/2015 1359   BUN 19 03/22/2015 0808   CREATININE 0.8 04/04/2015 1359   CREATININE 0.80 03/22/2015 0808   CREATININE 0.83 02/22/2014 1314      Component Value Date/Time   CALCIUM 8.6 04/04/2015 1359   CALCIUM 8.3* 03/22/2015 0808   ALKPHOS 73 04/04/2015 1359   ALKPHOS 68 08/13/2014 1222   AST 20 04/04/2015 1359   AST 21 08/13/2014 1222   ALT 19 04/04/2015 1359   ALT 15 08/13/2014 1222   BILITOT 0.51 04/04/2015 1359   BILITOT 1.3* 08/13/2014 1222       RADIOGRAPHIC STUDIES: Dg Chest 2 View  03/31/2015   CLINICAL DATA:  Lung cancer.  EXAM: CHEST  2 VIEW  COMPARISON:  03/15/2015  FINDINGS: Small left pleural effusion. Left basilar airspace disease likely reflecting posttreatment changes. The remainder the lungs are clear. No pneumothorax. Stable cardiomediastinal silhouette. No acute osseous abnormality.  IMPRESSION: No significant interval  change compared with 03/15/2015. Small left pleural effusion. Posttreatment changes at the left lung base.   Electronically Signed   By: Kathreen Devoid   On: 03/31/2015 13:11   Dg Chest 2 View  03/15/2015   CLINICAL DATA:  Adenocarcinoma of the lung,  stage IV.  EXAM: CHEST  2 VIEW  COMPARISON:  02/15/2015  FINDINGS: Left basilar pleural thickening and pleural fluid is stable. A tunneled pleural catheter is also in stable position, tip near the posterior costophrenic sulcus on the left.  Mild cardiomegaly is stable. No change in aortic or hilar contours. When accounting for left basilar lung scarring, no evidence of pneumonia. No edema or pneumothorax.  IMPRESSION: 1. Stable small left pleural effusion and pleural thickening. 2. Stable positioning of left pleural tunneled catheter.   Electronically Signed   By: Monte Fantasia M.D.   On: 03/15/2015 10:54   Ct Chest W Contrast  03/10/2015   CLINICAL DATA:  Restaging non small cell lung cancer diagnosed March 2015. Ongoing chemotherapy. Left-sided chest pain, cough, shortness of breath.  EXAM: CT CHEST, ABDOMEN, AND PELVIS WITH CONTRAST  TECHNIQUE: Multidetector CT imaging of the chest, abdomen and pelvis was performed following the standard protocol during bolus administration of intravenous contrast.  CONTRAST:  11m OMNIPAQUE IOHEXOL 300 MG/ML  SOLN  COMPARISON:  01/06/2015  FINDINGS: CT CHEST FINDINGS  Mediastinum/Nodes: Heart is normal size. Aorta is normal caliber. Scattered aortic and coronary artery calcifications. No mediastinal, hilar, or axillary adenopathy.  Lungs/Pleura: Small left pleural effusion has decreased since prior study. Pleural drainage catheter remains in place, unchanged. No pneumothorax. No right pleural effusion. Linear densities are noted in the lingula and left lower lobe, likely areas of scarring or atelectasis. No pulmonary nodules. Right lung remains clear.  Musculoskeletal: Chest wall soft tissues are unremarkable. No acute bony  abnormality or focal bone lesion.  CT ABDOMEN PELVIS FINDINGS  Hepatobiliary: Heterogeneous mass within the right hepatic lobe again noted, measuring 5.8 x 4.9 cm compared to 5.6 x 4.7 cm previously, this slight change in size is of unknown significance, possibly related to scan planes. Other smaller satellite nodules around this larger lesion are stable. No new hepatic lesion.  Pancreas: No focal abnormality or ductal dilatation.  Spleen: No focal abnormality.  Normal size.  Adrenals/Urinary Tract: No focal renal or adrenal abnormality. No hydronephrosis. Bladder is unremarkable.  Stomach/Bowel: Scattered descending colonic and sigmoid diverticula. No active diverticulitis. Stomach and small bowel are unremarkable. Appendix is visualized and is normal.  Vascular/Lymphatic: Moderate aortic and iliac calcifications without aneurysm. Stable 13 mm portacaval lymph node. No other retroperitoneal or mesenteric adenopathy.  Reproductive: No mass or focal abnormality.  Other: Small bilateral inguinal hernias containing fat. No free fluid or free air.  Musculoskeletal: No acute bony abnormality or focal bone lesion. Degenerative changes in the lumbar spine.  IMPRESSION: No evidence of recurrent local disease in the left lung. Small left pleural effusion has decreased in size with left pleural drainage catheter in stable position. No pneumothorax.  Mild increase in interstitial prominence in the left lung base, likely scarring or atelectasis.  Dominant hepatic lesion measures minimally larger (2 mm in both dimensions). This subtle difference is of unknown significance and may be related to scan planes but cannot completely exclude slight enlargement. Recommend continued attention on followup imaging.   Electronically Signed   By: KRolm BaptiseM.D.   On: 03/10/2015 12:49   Ct Abdomen Pelvis W Contrast  03/10/2015   CLINICAL DATA:  Restaging non small cell lung cancer diagnosed March 2015. Ongoing chemotherapy. Left-sided  chest pain, cough, shortness of breath.  EXAM: CT CHEST, ABDOMEN, AND PELVIS WITH CONTRAST  TECHNIQUE: Multidetector CT imaging of the chest, abdomen and pelvis was performed following the standard protocol during bolus administration  of intravenous contrast.  CONTRAST:  124m OMNIPAQUE IOHEXOL 300 MG/ML  SOLN  COMPARISON:  01/06/2015  FINDINGS: CT CHEST FINDINGS  Mediastinum/Nodes: Heart is normal size. Aorta is normal caliber. Scattered aortic and coronary artery calcifications. No mediastinal, hilar, or axillary adenopathy.  Lungs/Pleura: Small left pleural effusion has decreased since prior study. Pleural drainage catheter remains in place, unchanged. No pneumothorax. No right pleural effusion. Linear densities are noted in the lingula and left lower lobe, likely areas of scarring or atelectasis. No pulmonary nodules. Right lung remains clear.  Musculoskeletal: Chest wall soft tissues are unremarkable. No acute bony abnormality or focal bone lesion.  CT ABDOMEN PELVIS FINDINGS  Hepatobiliary: Heterogeneous mass within the right hepatic lobe again noted, measuring 5.8 x 4.9 cm compared to 5.6 x 4.7 cm previously, this slight change in size is of unknown significance, possibly related to scan planes. Other smaller satellite nodules around this larger lesion are stable. No new hepatic lesion.  Pancreas: No focal abnormality or ductal dilatation.  Spleen: No focal abnormality.  Normal size.  Adrenals/Urinary Tract: No focal renal or adrenal abnormality. No hydronephrosis. Bladder is unremarkable.  Stomach/Bowel: Scattered descending colonic and sigmoid diverticula. No active diverticulitis. Stomach and small bowel are unremarkable. Appendix is visualized and is normal.  Vascular/Lymphatic: Moderate aortic and iliac calcifications without aneurysm. Stable 13 mm portacaval lymph node. No other retroperitoneal or mesenteric adenopathy.  Reproductive: No mass or focal abnormality.  Other: Small bilateral inguinal  hernias containing fat. No free fluid or free air.  Musculoskeletal: No acute bony abnormality or focal bone lesion. Degenerative changes in the lumbar spine.  IMPRESSION: No evidence of recurrent local disease in the left lung. Small left pleural effusion has decreased in size with left pleural drainage catheter in stable position. No pneumothorax.  Mild increase in interstitial prominence in the left lung base, likely scarring or atelectasis.  Dominant hepatic lesion measures minimally larger (2 mm in both dimensions). This subtle difference is of unknown significance and may be related to scan planes but cannot completely exclude slight enlargement. Recommend continued attention on followup imaging.   Electronically Signed   By: KRolm BaptiseM.D.   On: 03/10/2015 12:49   Dg Chest Port 1 View  03/31/2015   CLINICAL DATA:  Port-A-Cath insertion  EXAM: PORTABLE CHEST - 1 VIEW  COMPARISON:  Study obtained earlier in the day  FINDINGS: Port-A-Cath tip is in the right atrium with the tip slightly beyond the cavoatrial junction. No pneumothorax. There is patchy infiltrate in the left base with small left effusion. Lungs elsewhere clear. There is cardiomegaly with pulmonary vascularity within normal limits. No adenopathy.  IMPRESSION: Port-A-Cath tip in right atrium. No pneumothorax. Persistent left base infiltrate with small left effusion. Stable cardiac prominence.   Electronically Signed   By: WLowella GripIII M.D.   On: 03/31/2015 16:39   Dg Fluoro Guide Cv Line-no Report  03/31/2015   CLINICAL DATA:    FLOURO GUIDE CV LINE  Fluoroscopy was utilized by the requesting physician.  No radiographic  interpretation.    ASSESSMENT AND PLAN: this is a very pleasant 74years old white male with:  1) Stage IV non-small cell lung cancer, adenocarcinoma presenting with large right upper lobe lung mass in addition to mediastinal and bilateral hilar lymphadenopathy as well as cervical lymphadenopathy and left pleural  effusion. The patient completed a course systemic chemotherapy with carboplatin and Alimta status post 6 cycles. This was followed by 3 cycles of maintenance chemotherapy with  single agent Alimta. The patient tolerated his treatment well. He had evidence for disease progression and the patient was started on treatment again with carboplatin and Alimta, status post 6 cycles. He is now receiving single agent Alimta, status post 1 cycle. Patient was discussed with and also seen by Dr. [name]. His proximal muscle weakness may be related to previous courses of steroids versus deconditioning. Patient is asked to continue his exercise program and to monitor his leg weakness very closely.  His Cushingoid appearance and swelling may be related to steroids. We will discontinue all oral steroids. He is asked to continue on the 80 mg of Lasix daily and follow up with his primary care physician regarding further increases/decreases. He will proceed with cycle #2 of Alimta today as scheduled and follow up in 3 weeks prior to cycle #3.  2) Recurrent left pleural effusion: He is status post Pleurx catheter placement. Continue drainage of the pleural fluid 3 times a week as recommended by Dr. Prescott Gum   3) Atrial fibrillation: continue Eliquis.  4) lower extremity edema: Improved. He will continue Lasix on as-needed basis.  He was advised to call immediately if he has any concerning symptoms in the interval.  The patient voices understanding of current disease status and treatment options and is in agreement with the current care plan.  All questions were answered. The patient knows to call the clinic with any problems, questions or concerns. We can certainly see the patient much sooner if necessary.  Carlton Adam, PA-C 04/04/2015  ADDENDUM: Hematology/Oncology Attending: I had a face to face encounter with the patient. I recommended his care plan. This is a very pleasant 74 years old white male with  metastatic non-small cell lung cancer, adenocarcinoma status post several chemotherapy regimens with carboplatin and Alimta as well as maintenance Alimta. He is currently undergoing maintenance Alimta status post 1 cycle and tolerated the first cycle of his treatment fairly well. The patient continues to have significant swelling of the lower extremities up to the waist. He also has cushingoid appearance and swelling of the face secondary to recent steroid treatment. I recommend for the patient to hold his treatment with the steroids for now. The patient will continue his treatment with Casodex 80 mg by mouth daily as prescribed by his primary care physician. He'll proceed with cycle #2 today as a scheduled. The patient would come back for follow-up visit in 3 weeks for reevaluation and management of any adverse effect of his treatment. He was advised to call immediately if he has any concerning symptoms in the interval.  Disclaimer: This note was dictated with voice recognition software. Similar sounding words can inadvertently be transcribed and may not be corrected upon review. Eilleen Kempf., MD 04/09/2015

## 2015-04-04 NOTE — Telephone Encounter (Signed)
Spoke with pts wife, advised of MDs message.  She verbalized understanding.  Lab appoint scheduled.

## 2015-04-04 NOTE — Patient Instructions (Signed)
[facility] Discharge Instructions for Patients Receiving Chemotherapy  Today you received the following chemotherapy agents; Alimta.   To help prevent nausea and vomiting after your treatment, we encourage you to take your nausea medication as directed.    If you develop nausea and vomiting that is not controlled by your nausea medication, call the clinic.   BELOW ARE SYMPTOMS THAT SHOULD BE REPORTED IMMEDIATELY:  *FEVER GREATER THAN 100.5 F  *CHILLS WITH OR WITHOUT FEVER  NAUSEA AND VOMITING THAT IS NOT CONTROLLED WITH YOUR NAUSEA MEDICATION  *UNUSUAL SHORTNESS OF BREATH  *UNUSUAL BRUISING OR BLEEDING  TENDERNESS IN MOUTH AND THROAT WITH OR WITHOUT PRESENCE OF ULCERS  *URINARY PROBLEMS  *BOWEL PROBLEMS  UNUSUAL RASH Items with * indicate a potential emergency and should be followed up as soon as possible.  Feel free to call the clinic you have any questions or concerns. The clinic phone number is [phone].  Please show the CHEMO ALERT CARD at check-in to the Emergency Department and triage nurse.   

## 2015-04-05 ENCOUNTER — Telehealth: Payer: Self-pay | Admitting: Internal Medicine

## 2015-04-05 NOTE — Patient Instructions (Signed)
Continue Lasix 80 mg by mouth daily and follow up with your primary care provider Discontinue your dexamethasone and any other oral steroids Follow up in 3 weeks, prior to your next scheduled cycle of chemotherapy

## 2015-04-05 NOTE — Telephone Encounter (Signed)
s.w. pt wife adn advised on added appt....Marland Kitchenok and aware

## 2015-04-07 ENCOUNTER — Other Ambulatory Visit (INDEPENDENT_AMBULATORY_CARE_PROVIDER_SITE_OTHER): Payer: Medicare Other

## 2015-04-07 DIAGNOSIS — I10 Essential (primary) hypertension: Secondary | ICD-10-CM | POA: Diagnosis not present

## 2015-04-07 LAB — BASIC METABOLIC PANEL
BUN: 35 mg/dL — ABNORMAL HIGH (ref 6–23)
CO2: 35 meq/L — AB (ref 19–32)
CREATININE: 0.8 mg/dL (ref 0.40–1.50)
Calcium: 8.7 mg/dL (ref 8.4–10.5)
Chloride: 101 mEq/L (ref 96–112)
GFR: 100.33 mL/min (ref >60.00)
GLUCOSE: 92 mg/dL (ref 70–99)
Potassium: 3.7 mEq/L (ref 3.5–5.1)
Sodium: 140 mEq/L (ref 135–145)

## 2015-04-10 ENCOUNTER — Ambulatory Visit: Payer: Medicare Other

## 2015-04-10 ENCOUNTER — Telehealth: Payer: Self-pay | Admitting: Internal Medicine

## 2015-04-10 ENCOUNTER — Other Ambulatory Visit (HOSPITAL_BASED_OUTPATIENT_CLINIC_OR_DEPARTMENT_OTHER): Payer: Medicare Other

## 2015-04-10 VITALS — BP 151/100 | HR 100 | Temp 98.4°F

## 2015-04-10 DIAGNOSIS — C3412 Malignant neoplasm of upper lobe, left bronchus or lung: Secondary | ICD-10-CM | POA: Diagnosis present

## 2015-04-10 DIAGNOSIS — C3492 Malignant neoplasm of unspecified part of left bronchus or lung: Secondary | ICD-10-CM

## 2015-04-10 DIAGNOSIS — Z95828 Presence of other vascular implants and grafts: Secondary | ICD-10-CM

## 2015-04-10 LAB — COMPREHENSIVE METABOLIC PANEL (CC13)
ALT: 27 U/L (ref 0–55)
ANION GAP: 12 meq/L — AB (ref 3–11)
AST: 22 U/L (ref 5–34)
Albumin: 3.2 g/dL — ABNORMAL LOW (ref 3.5–5.0)
Alkaline Phosphatase: 66 U/L (ref 40–150)
BUN: 25.8 mg/dL (ref 7.0–26.0)
CHLORIDE: 98 meq/L (ref 98–109)
CO2: 28 mEq/L (ref 22–29)
Calcium: 8.4 mg/dL (ref 8.4–10.4)
Creatinine: 0.8 mg/dL (ref 0.7–1.3)
EGFR: 87 mL/min/{1.73_m2} — ABNORMAL LOW (ref >90)
Glucose: 219 mg/dl — ABNORMAL HIGH (ref 70–140)
POTASSIUM: 3.3 meq/L — AB (ref 3.5–5.1)
SODIUM: 138 meq/L (ref 136–145)
TOTAL PROTEIN: 5.8 g/dL — AB (ref 6.4–8.3)
Total Bilirubin: 1.49 mg/dL — ABNORMAL HIGH (ref 0.20–1.20)

## 2015-04-10 LAB — CBC WITH DIFFERENTIAL/PLATELET
BASO%: 0.6 % (ref 0.0–2.0)
Basophils Absolute: 0 10*3/uL (ref 0.0–0.1)
EOS%: 0.7 % (ref 0.0–7.0)
Eosinophils Absolute: 0 10*3/uL (ref 0.0–0.5)
HEMATOCRIT: 33.2 % — AB (ref 38.4–49.9)
HGB: 10.7 g/dL — ABNORMAL LOW (ref 13.0–17.1)
LYMPH#: 0.3 10*3/uL — AB (ref 0.9–3.3)
LYMPH%: 8.3 % — AB (ref 14.0–49.0)
MCH: 30.6 pg (ref 27.2–33.4)
MCHC: 32.2 g/dL (ref 32.0–36.0)
MCV: 95 fL (ref 79.3–98.0)
MONO#: 0.1 10*3/uL (ref 0.1–0.9)
MONO%: 2.2 % (ref 0.0–14.0)
NEUT#: 3.6 10*3/uL (ref 1.5–6.5)
NEUT%: 88.2 % — AB (ref 39.0–75.0)
PLATELETS: 176 10*3/uL (ref 140–400)
RBC: 3.5 10*6/uL — ABNORMAL LOW (ref 4.20–5.82)
RDW: 17.3 % — ABNORMAL HIGH (ref 11.0–14.6)
WBC: 4.1 10*3/uL (ref 4.0–10.3)

## 2015-04-10 MED ORDER — SODIUM CHLORIDE 0.9 % IJ SOLN
10.0000 mL | INTRAMUSCULAR | Status: DC | PRN
  Administered 2015-04-10: 10 mL via INTRAVENOUS
  Filled 2015-04-10: qty 10

## 2015-04-10 MED ORDER — HEPARIN SOD (PORK) LOCK FLUSH 100 UNIT/ML IV SOLN
500.0000 [IU] | Freq: Once | INTRAVENOUS | Status: AC
  Administered 2015-04-10: 500 [IU] via INTRAVENOUS
  Filled 2015-04-10: qty 5

## 2015-04-10 NOTE — Patient Instructions (Signed)

## 2015-04-10 NOTE — Telephone Encounter (Signed)
pt wife called to r/s appt today...pt ok and aware

## 2015-04-11 ENCOUNTER — Other Ambulatory Visit: Payer: Self-pay | Admitting: Family Medicine

## 2015-04-11 ENCOUNTER — Other Ambulatory Visit: Payer: Medicare Other

## 2015-04-11 MED ORDER — POTASSIUM CHLORIDE CRYS ER 20 MEQ PO TBCR: 20.0000 meq | EXTENDED_RELEASE_TABLET | Freq: Two times a day (BID) | ORAL | Status: DC | PRN

## 2015-04-13 ENCOUNTER — Telehealth: Payer: Self-pay | Admitting: Nurse Practitioner

## 2015-04-13 ENCOUNTER — Ambulatory Visit (INDEPENDENT_AMBULATORY_CARE_PROVIDER_SITE_OTHER): Payer: Medicare Other | Admitting: Family Medicine

## 2015-04-13 ENCOUNTER — Encounter: Payer: Medicare Other | Admitting: Nurse Practitioner

## 2015-04-13 ENCOUNTER — Encounter: Payer: Self-pay | Admitting: Family Medicine

## 2015-04-13 ENCOUNTER — Telehealth: Payer: Self-pay | Admitting: Family Medicine

## 2015-04-13 ENCOUNTER — Telehealth: Payer: Self-pay

## 2015-04-13 VITALS — BP 112/72 | HR 96 | Temp 97.7°F | Ht 67.5 in | Wt 197.0 lb

## 2015-04-13 DIAGNOSIS — I4891 Unspecified atrial fibrillation: Secondary | ICD-10-CM | POA: Diagnosis not present

## 2015-04-13 DIAGNOSIS — I5030 Unspecified diastolic (congestive) heart failure: Secondary | ICD-10-CM | POA: Diagnosis not present

## 2015-04-13 DIAGNOSIS — R600 Localized edema: Secondary | ICD-10-CM | POA: Diagnosis not present

## 2015-04-13 DIAGNOSIS — E8809 Other disorders of plasma-protein metabolism, not elsewhere classified: Secondary | ICD-10-CM

## 2015-04-13 DIAGNOSIS — C3492 Malignant neoplasm of unspecified part of left bronchus or lung: Secondary | ICD-10-CM

## 2015-04-13 DIAGNOSIS — E876 Hypokalemia: Secondary | ICD-10-CM

## 2015-04-13 MED ORDER — POTASSIUM CHLORIDE CRYS ER 20 MEQ PO TBCR: EXTENDED_RELEASE_TABLET | ORAL | Status: DC

## 2015-04-13 MED ORDER — TORSEMIDE 20 MG PO TABS: ORAL_TABLET | ORAL | Status: DC

## 2015-04-13 NOTE — Telephone Encounter (Signed)
Wife called stating legs are firey red and swollen

## 2015-04-13 NOTE — Telephone Encounter (Signed)
Pt has appt today with Dr Diona Browner 11 Am.

## 2015-04-13 NOTE — Patient Instructions (Addendum)
Stop lasix and change to torsemide 40 mg in AM and 40 mg in afternoon.  Make sure to start potassium. Follow up in 1 week with Dr. Darnell Level to re-eval edema and to check potassium and kidney function on stronger diuretic.

## 2015-04-13 NOTE — Telephone Encounter (Signed)
Reviewed pt concerns and triage message with MD, instructed pt's wife per MD to have pt further evaluated by PCP as he manages pt's Lasix and swelling in legs.  Pt's wife verbalized understanding, and will call PCP for an apt.

## 2015-04-13 NOTE — Telephone Encounter (Signed)
Patient Name: Shawn Espinoza  DOB: [birth year]    Initial Comment Caller states husband is cancer patient, legs are fiery red, feet almost purple. When he stands up, he has stinging and burning pain. Cancer MD does not have any appts open, advised to see PCP.   Nurse Assessment  Nurse: Mallie Mussel, RN, Alveta Heimlich Date/Time Eilene Ghazi Time): 04/13/2015 8:55:37 AM  Confirm and document reason for call. If symptomatic, describe symptoms. ---Caller states husband is cancer patient, legs are fiery red, feet almost purple. When he stands up, he has stinging and burning pain. His legs have been red, but they have extended further. He has had a lot of swelling and is on Lasix which has helped. The cancer doctor cannot see him today and suggested he see his PCP. After he is up for a while, the pain eases off some. When he first gets up, he rates his pain as 8 on 0-10 scale. Denies fever.  Has the patient traveled out of the country within the last 30 days? ---No  Does the patient require triage? ---Yes  Related visit to physician within the last 2 weeks? ---No  Does the PT have any chronic conditions? (i.e. diabetes, asthma, etc.) ---Yes  List chronic conditions. ---AFib, Lung CA     Guidelines    Guideline Title Affirmed Question Affirmed Notes  Leg Pain Cancer treatment in the past two months (or has cancer now)    Final Disposition User   See Physician within 4 Hours (or PCP triage) Mallie Mussel, RN, Alveta Heimlich    Comments  Appointment scheduled at 11:00 with Dr. Eliezer Lofts.

## 2015-04-13 NOTE — Telephone Encounter (Signed)
Legs have been red and swollen but are getting worse. He is asking to see the MD. Now it is burning sharp pain when he stands up. The pain will decrease the longer he is up but still present. [facility] visit set up.

## 2015-04-13 NOTE — Progress Notes (Signed)
   Subjective:    Patient ID: Shawn Espinoza, male    DOB: [birth year], 74 y.o.   MRN: [id number]  HPI  74 year old male pt of Dr. Synthia Innocent with history of steroid induced DM , HTN, insteritial pneumonitis, left lung cancer stage 4, recent pleural effusion, afib on eliqus presents with new onset swelling in both lower legs x months now with  redness  x 6 weeks ago.  Has pain .Marland Kitchen Feels pin and needles when first puts weight on legs.  Last OV with ONC on 4/12:  Current chemotherapy. Discussed swelling in legs with onc.. Suggested using cortis.one cream and  increased lasix from 20 mg to 80 mg .. Slight improvement. Initially had some increase in urine out put but this has since slowed down. He is elevating feet some duringt he day and  every night.  Swelling is not better when he wakes up in the AM. He has lost 13 lbs since 4/12. Tried additional 40 mg daily in last week, no help. He feel s he is swollen up to stomach.  He never started potassium. Not sure why.   Wt Readings from Last 3 Encounters:  04/13/15 197 lb (89.359 kg)  04/04/15 210 lb 9.6 oz (95.528 kg)  03/31/15 210 lb 8 oz (95.482 kg)   Had ECHO on 03/23/2015: EF 60-65% [place] to legs: nml  Few months ago. TSH 4.99, free T4 nml 3 weeks ago.  Nml kidney and liver function 3 days ago.  Potassium 3.3  3 days ago.  Review of Systems  Constitutional: Positive for fatigue. Negative for fever.  HENT: Negative for ear pain.   Eyes: Negative for pain.  Respiratory: Positive for shortness of breath. Negative for wheezing.   Cardiovascular: Positive for leg swelling. Negative for chest pain and palpitations.  Gastrointestinal: Negative for vomiting, diarrhea and constipation.       Objective:   Physical Exam  Constitutional: Vital signs are normal. He appears well-developed and well-nourished.  Elderly male in NAD  HENT:  Head: Normocephalic.  Right Ear: Hearing normal.  Left Ear: Hearing normal.  Nose: Nose normal.    Mouth/Throat: Oropharynx is clear and moist and mucous membranes are normal.  Neck: Trachea normal. Carotid bruit is not present. No thyroid mass and no thyromegaly present.  Cardiovascular: Normal rate, regular rhythm and normal pulses.  Exam reveals no gallop, no distant heart sounds and no friction rub.   No murmur heard. 3 plus pitting peripheral edema  Pulmonary/Chest: Effort normal. No respiratory distress. He has decreased breath sounds. He has rhonchi.  Skin: Skin is warm, dry and intact. No rash noted.  Bilateral legs with erythema, slight warmth, chronic venous stasis changes, no ulcers  Psychiatric: He has a normal mood and affect. His speech is normal and behavior is normal. Thought content normal.          Assessment & Plan:

## 2015-04-13 NOTE — Telephone Encounter (Signed)
Now cancel smc as patient is going to see his pcp   Shawn Espinoza

## 2015-04-13 NOTE — Progress Notes (Signed)
Pre visit review using our clinic review tool, if applicable. No additional management support is needed unless otherwise documented below in the visit note. 

## 2015-04-14 ENCOUNTER — Telehealth: Payer: Self-pay | Admitting: *Deleted

## 2015-04-14 ENCOUNTER — Other Ambulatory Visit: Payer: Self-pay | Admitting: *Deleted

## 2015-04-14 ENCOUNTER — Ambulatory Visit (INDEPENDENT_AMBULATORY_CARE_PROVIDER_SITE_OTHER): Payer: Self-pay

## 2015-04-14 DIAGNOSIS — Z4802 Encounter for removal of sutures: Secondary | ICD-10-CM

## 2015-04-14 DIAGNOSIS — E876 Hypokalemia: Secondary | ICD-10-CM | POA: Insufficient documentation

## 2015-04-14 DIAGNOSIS — C3492 Malignant neoplasm of unspecified part of left bronchus or lung: Secondary | ICD-10-CM

## 2015-04-14 NOTE — Assessment & Plan Note (Signed)
Pt has not been taking potassium for some reason despite increased diuresis. Recent K slightly low. Restart KCL to take with each dose of diuretic. Will need recheck at follow up in 1 week.

## 2015-04-14 NOTE — Progress Notes (Signed)
Removed 6 sutures from incision sites with no signs of infection and patient tolerated well.

## 2015-04-14 NOTE — Assessment & Plan Note (Addendum)
Likely secondary to chem, inactivity and hypoalbuminemia. He has diastolic CHF, recent ECHO great. No sign of DVT with recetn doppler of legs bilaterally.  No clear new additional factors such as infeciton, thyroid, kidney, liver issues.  Will stop furosemide and change to torsemide to stimulate diuresis.  Will need kidney funciton recheck at follow up in 1 week on increased diuresis.

## 2015-04-14 NOTE — Telephone Encounter (Signed)
Call received by TRIAGE from Clair Gulling resp therapist at [facility].  Clair Gulling is inquiring " if orders received via fax for visit on Monday"  This RN was unable to verify the above.  Per phone discussion JIm states per medicare guidelines pt needs :  02 saturation levels on room air at rest and on exertion  per visit on Monday.  MD will also need to send a new O2 order to [facility] with the above readings.  Fax number for above is [phone].  This note will be sent to MD and RN at desk for visit on Monday.

## 2015-04-17 ENCOUNTER — Encounter: Payer: Self-pay | Admitting: Physician Assistant

## 2015-04-17 ENCOUNTER — Ambulatory Visit (HOSPITAL_BASED_OUTPATIENT_CLINIC_OR_DEPARTMENT_OTHER): Payer: Medicare Other | Admitting: Physician Assistant

## 2015-04-17 ENCOUNTER — Other Ambulatory Visit (HOSPITAL_BASED_OUTPATIENT_CLINIC_OR_DEPARTMENT_OTHER): Payer: Medicare Other

## 2015-04-17 ENCOUNTER — Other Ambulatory Visit: Payer: Medicare Other

## 2015-04-17 VITALS — BP 136/80 | HR 105 | Temp 98.3°F | Resp 18 | Ht 67.5 in | Wt 189.3 lb

## 2015-04-17 DIAGNOSIS — C3412 Malignant neoplasm of upper lobe, left bronchus or lung: Secondary | ICD-10-CM

## 2015-04-17 DIAGNOSIS — C3492 Malignant neoplasm of unspecified part of left bronchus or lung: Secondary | ICD-10-CM

## 2015-04-17 DIAGNOSIS — R530 Neoplastic (malignant) related fatigue: Secondary | ICD-10-CM

## 2015-04-17 DIAGNOSIS — E876 Hypokalemia: Secondary | ICD-10-CM | POA: Diagnosis not present

## 2015-04-17 LAB — CBC WITH DIFFERENTIAL/PLATELET
BASO%: 0 % (ref 0.0–2.0)
Basophils Absolute: 0 10*3/uL (ref 0.0–0.1)
EOS%: 0.9 % (ref 0.0–7.0)
Eosinophils Absolute: 0 10*3/uL (ref 0.0–0.5)
HCT: 30 % — ABNORMAL LOW (ref 38.4–49.9)
HEMOGLOBIN: 10 g/dL — AB (ref 13.0–17.1)
LYMPH#: 0.9 10*3/uL (ref 0.9–3.3)
LYMPH%: 20.3 % (ref 14.0–49.0)
MCH: 31 pg (ref 27.2–33.4)
MCHC: 33.3 g/dL (ref 32.0–36.0)
MCV: 92.9 fL (ref 79.3–98.0)
MONO#: 0.6 10*3/uL (ref 0.1–0.9)
MONO%: 13.8 % (ref 0.0–14.0)
NEUT%: 65 % (ref 39.0–75.0)
NEUTROS ABS: 2.8 10*3/uL (ref 1.5–6.5)
Platelets: 112 10*3/uL — ABNORMAL LOW (ref 140–400)
RBC: 3.23 10*6/uL — ABNORMAL LOW (ref 4.20–5.82)
RDW: 16.4 % — ABNORMAL HIGH (ref 11.0–14.6)
WBC: 4.3 10*3/uL (ref 4.0–10.3)

## 2015-04-17 LAB — COMPREHENSIVE METABOLIC PANEL (CC13)
ALBUMIN: 3.2 g/dL — AB (ref 3.5–5.0)
ALK PHOS: 72 U/L (ref 40–150)
ALT: 20 U/L (ref 0–55)
ANION GAP: 17 meq/L — AB (ref 3–11)
AST: 23 U/L (ref 5–34)
BUN: 25.1 mg/dL (ref 7.0–26.0)
CALCIUM: 8.6 mg/dL (ref 8.4–10.4)
CO2: 30 mEq/L — ABNORMAL HIGH (ref 22–29)
Chloride: 91 mEq/L — ABNORMAL LOW (ref 98–109)
Creatinine: 1.4 mg/dL — ABNORMAL HIGH (ref 0.7–1.3)
EGFR: 49 mL/min/{1.73_m2} — ABNORMAL LOW (ref >90)
Glucose: 195 mg/dl — ABNORMAL HIGH (ref 70–140)
POTASSIUM: 2.5 meq/L — AB (ref 3.5–5.1)
Sodium: 138 mEq/L (ref 136–145)
Total Bilirubin: 0.88 mg/dL (ref 0.20–1.20)
Total Protein: 6.2 g/dL — ABNORMAL LOW (ref 6.4–8.3)

## 2015-04-17 MED ORDER — POTASSIUM CHLORIDE CRYS ER 20 MEQ PO TBCR
40.0000 meq | EXTENDED_RELEASE_TABLET | Freq: Once | ORAL | Status: AC
  Administered 2015-04-17: 40 meq via ORAL
  Filled 2015-04-17: qty 2

## 2015-04-17 NOTE — Patient Instructions (Signed)
Take your potassium as prescribed Continue labs and chemotherapy as scheduled Follow up in 4 weeks with a restaging CT scan of your chest, abdomen and pelvis to re-evaluate your disease

## 2015-04-17 NOTE — Progress Notes (Addendum)
[name] Telephone:([phone]) [phone]   Fax:(336) [place], [place] [address]  DIAGNOSIS: Stage IIIB/IV non-small cell lung cancer, adenocarcinoma diagnosed in March of 2015.  PRIOR THERAPY:  1) Systemic chemotherapy with carboplatin for AUC of 5 and Alimta 500 mg/M2 every 3 weeks. First dose expected on 04/13/2014. Status post 6 cycles. 2)  Maintenance chemotherapy with single agent Alimta 500 mg/M2 every 3 weeks. First cycle on 09/06/2014. Status post 1 cycle. 3) Second course of systemic chemotherapy with carboplatin for AUC of 5 and Alimta 500 MG/M2 every 3 weeks. First cycle 11/08/2014. Status post 6 cycles.  CURRENT THERAPY: Maintenance chemotherapy with single agent Alimta 500 mg/M2 every 3 weeks. Status post 2 cycles  INTERVAL HISTORY: Shawn Espinoza 74 y.o. male returns to the clinic today for follow up visit. He reports that he was taken off lasix and started on Demadex (torsemide) 40 mg by mouth twice daily on 04/14/2015. He was also given a prescription for potassium that he was to take twice daily with the torsemide. Patient reports that he did not start the potassium because the tablets were too large. He did take an over the counter potassium supplement. He has lost 21.3 pounds since 04/04/2015, 7.7 pounds of which were lost from 04/13/2015 to today (04/17/2015). He has noticed a significant decrease in his lower extremity edema. He continues to have some redness and warmth to his legs. He is to follow up with his primary care physician on 04/20/2015. His shortness of breath is somewhat improved. He is tolerating his treatment with single agent Alimta well with no significant adverse effects. He denied having any significant chest pain but continues to have shortness of breath and mild cough with no hemoptysis. He has no significant fever or chills, no nausea or vomiting. He complains of leg  weakness particularly in his thigh muscles. He finds it difficult to climb stairs. He denied any numbness or tingling in his back or into the lower extremities, and has full control of his bowels and bladder He presents for cycle #3 of maintenance Alimta.  MEDICAL HISTORY: Past Medical History  Diagnosis Date  . History of diabetes mellitus, type II     resolved with diet, h/o neuropathy  . Diverticulosis of colon   . Hyperlipidemia   . Hypertension   . Fatty liver 02/29/00    abd ultrasound  . Lower back pain   . Systolic murmur 2585    2Decho - normal LV fxn, EF 55%, mild AS, biatrial enlargement  . History of tobacco use quit 1990s  . 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 Personal history of colonic adenomas 07/06/2013  . Hypertensive retinopathy of both eyes 2015    mild  . 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 Dysrhythmia     Atrial fib (found 07/2014)  . Shortness of breath   . Pneumonia   . GERD (gastroesophageal reflux disease)     occasional  . Arthritis   . Neuropathy   . Constipation   . 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 Diabetes mellitus without complication     Type 2  . Depression     ALLERGIES:  is allergic to candesartan cilexetil; diltiazem hcl; doxazosin mesylate; nifedipine; pravastatin; red yeast rice; rosuvastatin; sertraline; and simvastatin.  MEDICATIONS:  Current Outpatient Prescriptions  Medication Sig Dispense Refill  . albuterol (PROVENTIL HFA;VENTOLIN HFA) 108 (90 BASE) MCG/ACT inhaler Inhale 2 puffs into the lungs every 6 (six) hours as needed for wheezing or shortness of breath. 1 Inhaler 0  . Alum & Mag Hydroxide-Simeth (MAGIC MOUTHWASH W/LIDOCAINE) SOLN Take 5 mLs by mouth 3 (three) times daily as needed for mouth pain. 5 mL 0  . apixaban (ELIQUIS) 5 MG TABS  tablet Take 1 tablet (5 mg total) by mouth 2 (two) times daily. 60 tablet 6  . Blood Glucose Monitoring Suppl (ONE TOUCH ULTRA SYSTEM KIT) W/DEVICE KIT Use as directed to check blood sugar Dx: E09.9 1 each 0  . cetirizine (ZYRTEC) 10 MG tablet Take 10 mg by mouth daily as needed for allergies or rhinitis.     Marland Kitchen dexamethasone (DECADRON) 4 MG tablet 4 MG BY MOUTH TWICE A DAY THE DAY BEFORE, DAY OF AND DAY AFTER THE CHEMOTHERAPY EVERY 3 WEEKS. (Patient taking differently: 4 MG BY MOUTH TWICE A DAY THE DAY BEFORE, DAY OF AND DAY AFTER THE CHEMOTHERAPY AND THEN ONCE DAILY THEREAFTER.) 40 tablet 1  . diphenoxylate-atropine (LOMOTIL) 2.5-0.025 MG per tablet Take 2 tablets by mouth 4 (four) times daily as needed for diarrhea or loose stools. 30 tablet 0  . folic acid (FOLVITE) 1 MG tablet TAKE 1 TABLET BY MOUTH EVERY DAY 30 tablet 4  . furosemide (LASIX) 80 MG tablet   6  . glimepiride (AMARYL) 1 MG tablet   11  . glucose blood test strip Use as instructed to check blood sugar once daily and as needed. Dx: E09.9 200 each 3  . HYDROcodone-acetaminophen (NORCO/VICODIN) 5-325 MG per tablet Take 1 tablet by mouth every 6 (six) hours as needed for moderate pain.    . Lancets MISC Use to check sugar once daily and as needed Dx: F64.3 **ONE TOUCH DELICA** 329 each 3  . lidocaine-prilocaine (EMLA) cream Apply 1 application topically as needed. 30 g 1  . LORazepam (ATIVAN) 0.5 MG tablet Take 0.5 mg by mouth 2 (two) times daily as needed for anxiety.   0  . methylPREDNISolone (MEDROL DOSEPAK) 4 MG tablet Take 4 mg by mouth as directed. follow package directions    . metoprolol tartrate (LOPRESSOR) 25 MG tablet Take 1 tablet (25 mg total) by mouth 2 (two) times daily. 60 tablet 6  . Multiple Vitamins-Minerals (ICAPS) CAPS Take by mouth 2 (two) times daily.    Marland Kitchen nystatin (MYCOSTATIN) 100000 UNIT/ML suspension Take 5 mLs (500,000 Units total) by mouth 3 (three) times daily. 120 mL 0  . polyethylene glycol (MIRALAX /  GLYCOLAX) packet Take 17 g by mouth daily.     . potassium chloride SA (K-DUR,KLOR-CON) 20 MEQ tablet Take 1 with each dose of lasix. 60 tablet 3  . sertraline (ZOLOFT) 25 MG tablet Take 1 tablet (25 mg total) by mouth daily. 30 tablet 3  . sucralfate (CARAFATE) 1 GM/10ML suspension Take 1 g by mouth 4 (four) times daily as needed (reflux).    . torsemide (DEMADEX) 20 MG tablet 2 tabs in AM and 2 tabs in afternoon 60 tablet 0  . traMADol (ULTRAM) 50 MG tablet   0   No current facility-administered medications for this visit.    SURGICAL HISTORY:  Past Surgical History  Procedure Laterality Date  . Colonoscopy  2004  . Colonoscopy  2014    tubular adenoma x1, mod diverticulosis ([place])  . Flexible bronchoscopy  02/2014    WNL  . Video bronchoscopy Bilateral 03/17/2014    Procedure: VIDEO BRONCHOSCOPY WITH FLUORO;  Surgeon: Tanda Rockers, MD;  Location: [facility];  Service: Cardiopulmonary;  Laterality: Bilateral;  . Chest tube insertion Left 08/26/2014    Procedure: INSERTION OF LEFT  PLEURAL DRAINAGE CATHETER;  Surgeon: Ivin Poot, MD;  Location: [facility];  Service: Thoracic;  Laterality: Left;  . Cataract extraction Right 03/2015    Dr [name]  . Portacath placement Right 03/31/2015    Procedure: INSERTION PORT-A-CATH;  Surgeon: Ivin Poot, MD;  Location: [facility];  Service: Thoracic;  Laterality: Right;  . Removal of pleural drainage catheter Left 03/31/2015    Procedure: REMOVAL OF PLEURAL DRAINAGE CATHETER;  Surgeon: Ivin Poot, MD;  Location: [place];  Service: Thoracic;  Laterality: Left;    REVIEW OF SYSTEMS:  A comprehensive review of systems was negative except for: Constitutional: positive for fatigue, weight loss and due to recent diuresis Respiratory: positive for dyspnea on exertion Cardiovascular: positive for lower extremity edema Musculoskeletal: positive for muscle weakness and affecting his thighs Neurological: positive for paresthesia, weakness and  paaresthesias affecting the feet   PHYSICAL EXAMINATION: General appearance: alert, cooperative, fatigued, no distress and Cushionoid appearance Head: Normocephalic, without obvious abnormality, atraumatic Neck: no adenopathy, no JVD, supple, symmetrical, trachea midline and thyroid not enlarged, symmetric, no tenderness/mass/nodules Lymph nodes: Cervical, supraclavicular, and axillary nodes normal. Resp: diminished breath sounds LLL and dullness to percussion LLL Back: symmetric, no curvature. ROM normal. No CVA tenderness. Cardio: regular rate and rhythm, S1, S2 normal, no murmur, click, rub or gallop GI: soft, non-tender; bowel sounds normal; no masses,  no organomegaly Extremities: edema 2+ pitting edema bilateral lower extremities diminishing to1+ at thigh level Neurologic: Grossly normal  ECOG PERFORMANCE STATUS: 1 - Symptomatic but completely ambulatory  Blood pressure 136/80, pulse 105, temperature 98.3 F (36.8 C), temperature source Oral, resp. rate 18, height 5' 7.5" (1.715 m), weight 189 lb 4.8 oz (85.866 kg), SpO2 99 %.  LABORATORY DATA: Lab Results  Component Value Date   WBC 4.3 04/17/2015   HGB 10.0* 04/17/2015   HCT 30.0* 04/17/2015   MCV 92.9 04/17/2015   PLT 112* 04/17/2015      Chemistry      Component Value Date/Time   NA 138 04/17/2015 1323   NA 140 04/07/2015 1039   K 2.5* 04/17/2015 1323   K 3.7 04/07/2015 1039   CL 101 04/07/2015 1039   CO2 30* 04/17/2015 1323   CO2 35* 04/07/2015 1039   BUN 25.1 04/17/2015 1323   BUN 35* 04/07/2015 1039   CREATININE 1.4* 04/17/2015 1323   CREATININE 0.80 04/07/2015 1039   CREATININE 0.83 02/22/2014 1314      Component Value Date/Time   CALCIUM 8.6 04/17/2015 1323   CALCIUM 8.7 04/07/2015 1039   ALKPHOS 72 04/17/2015 1323   ALKPHOS 68 08/13/2014 1222   AST 23 04/17/2015 1323   AST 21 08/13/2014 1222   ALT 20 04/17/2015 1323   ALT 15 08/13/2014 1222   BILITOT 0.88 04/17/2015 1323   BILITOT 1.3* 08/13/2014  1222       RADIOGRAPHIC STUDIES: Dg Chest 2 View  03/31/2015   CLINICAL DATA:  Lung cancer.  EXAM: CHEST  2 VIEW  COMPARISON:  03/15/2015  FINDINGS: Small left pleural effusion. Left basilar airspace disease likely reflecting posttreatment changes. The remainder the lungs  are clear. No pneumothorax. Stable cardiomediastinal silhouette. No acute osseous abnormality.  IMPRESSION: No significant interval change compared with 03/15/2015. Small left pleural effusion. Posttreatment changes at the left lung base.   Electronically Signed   By: Kathreen Devoid   On: 03/31/2015 13:11   Dg Chest Port 1 View  03/31/2015   CLINICAL DATA:  Port-A-Cath insertion  EXAM: PORTABLE CHEST - 1 VIEW  COMPARISON:  Study obtained earlier in the day  FINDINGS: Port-A-Cath tip is in the right atrium with the tip slightly beyond the cavoatrial junction. No pneumothorax. There is patchy infiltrate in the left base with small left effusion. Lungs elsewhere clear. There is cardiomegaly with pulmonary vascularity within normal limits. No adenopathy.  IMPRESSION: Port-A-Cath tip in right atrium. No pneumothorax. Persistent left base infiltrate with small left effusion. Stable cardiac prominence.   Electronically Signed   By: Lowella Grip III M.D.   On: 03/31/2015 16:39   Dg Fluoro Guide Cv Line-no Report  03/31/2015   CLINICAL DATA:    FLOURO GUIDE CV LINE  Fluoroscopy was utilized by the requesting physician.  No radiographic  interpretation.    ASSESSMENT AND PLAN: this is a very pleasant 74 years old white male with:  1) Stage IV non-small cell lung cancer, adenocarcinoma presenting with large right upper lobe lung mass in addition to mediastinal and bilateral hilar lymphadenopathy as well as cervical lymphadenopathy and left pleural effusion. The patient completed a course systemic chemotherapy with carboplatin and Alimta status post 6 cycles. This was followed by 3 cycles of maintenance chemotherapy with single agent Alimta.  The patient tolerated his treatment well. He had evidence for disease progression and the patient was started on treatment again with carboplatin and Alimta, status post 6 cycles. He is now receiving single agent Alimta, status post 2 cycles. Patient was discussed with and also seen by Dr. [name]. His proximal muscle weakness may be related to previous courses of steroids versus deconditioning.  His Cushingoid appearance and swelling may be related to steroids. Steroids have been discontinued. He is to follow up with his primary care physician as scheduled. He will proceed with cycle #3 of Alimta as scheduled on 04/25/2015 and follow up in 4 weeks prior to cycle #4 with a restaging CT scan of his chest, abdomen and pelvis with contrast to re-evaluate his disease.  2) Recurrent left pleural effusion: He is status post Pleurx catheter placement. Pleurex was removed by Dr. Prescott Gum on 03/31/2015.  3) Atrial fibrillation: continue Eliquis.  4) lower extremity edema: Improved. He will continue torsemide as prescribed by his primary care physician.  5) Port A Cath place by Dr. Prescott Gum on 03/31/2015 .  6) Hypokalemia: patient's potassium today was 2.5 today. He was given 40 meq of potassium chloride by mouth prior to leaving our office. He was advised to take 20 meq of potassium twice daily as previously prescribed and follow up with his primary care physician on 04/20/2015.   He was advised to call immediately if he has any concerning symptoms in the interval.  The patient voices understanding of current disease status and treatment options and is in agreement with the current care plan.  All questions were answered. The patient knows to call the clinic with any problems, questions or concerns. We can certainly see the patient much sooner if necessary.  Carlton Adam, PA-C 04/17/2015  ADDENDUM:  Hematology/Oncology Attending:  I had a face to face encounter with the patient. I recommended  his care plan. This is a very pleasant 74 years old white male with stage IV non-small cell lung cancer, adenocarcinoma status post 2 courses of systemic chemotherapy with carboplatin and Alimta and he is currently on treatment with maintenance Alimta status post 2 cycles and tolerating his treatment fairly well. The patient had significant swelling of his lower extremities which was managed by his primary care physician and he was recently treated with torsemide with improvement in his edema.. I recommended for the patient to proceed with cycle #3 today as scheduled. He would come back for follow-up visit in 3 weeks for evaluation after repeating CT scan of the chest, abdomen and pelvis for restaging of his disease. For the hypokalemia, the patient will continue on potassium chloride 40 mEq by mouth daily and he was also advised to check with his primary care physician for evaluation and adjustment of his potassium supplements. He was advised to call immediately if he has any concerning symptoms in the interval.  Disclaimer: This note was dictated with voice recognition software. Similar sounding words can inadvertently be transcribed and may be missed upon review. Eilleen Kempf., MD 04/22/2015

## 2015-04-18 ENCOUNTER — Encounter (INDEPENDENT_AMBULATORY_CARE_PROVIDER_SITE_OTHER): Payer: Medicare Other | Admitting: Ophthalmology

## 2015-04-18 ENCOUNTER — Other Ambulatory Visit: Payer: Medicare Other

## 2015-04-19 ENCOUNTER — Encounter (INDEPENDENT_AMBULATORY_CARE_PROVIDER_SITE_OTHER): Payer: Medicare Other | Admitting: Ophthalmology

## 2015-04-19 DIAGNOSIS — E11329 Type 2 diabetes mellitus with mild nonproliferative diabetic retinopathy without macular edema: Secondary | ICD-10-CM

## 2015-04-19 DIAGNOSIS — I10 Essential (primary) hypertension: Secondary | ICD-10-CM

## 2015-04-19 DIAGNOSIS — H2512 Age-related nuclear cataract, left eye: Secondary | ICD-10-CM

## 2015-04-19 DIAGNOSIS — H33302 Unspecified retinal break, left eye: Secondary | ICD-10-CM | POA: Diagnosis not present

## 2015-04-19 DIAGNOSIS — H43813 Vitreous degeneration, bilateral: Secondary | ICD-10-CM | POA: Diagnosis not present

## 2015-04-19 DIAGNOSIS — H35033 Hypertensive retinopathy, bilateral: Secondary | ICD-10-CM | POA: Diagnosis not present

## 2015-04-19 DIAGNOSIS — H3531 Nonexudative age-related macular degeneration: Secondary | ICD-10-CM | POA: Diagnosis not present

## 2015-04-19 DIAGNOSIS — E11319 Type 2 diabetes mellitus with unspecified diabetic retinopathy without macular edema: Secondary | ICD-10-CM | POA: Diagnosis not present

## 2015-04-20 ENCOUNTER — Ambulatory Visit (INDEPENDENT_AMBULATORY_CARE_PROVIDER_SITE_OTHER): Payer: Medicare Other | Admitting: Family Medicine

## 2015-04-20 ENCOUNTER — Encounter: Payer: Self-pay | Admitting: Family Medicine

## 2015-04-20 VITALS — BP 130/84 | HR 82 | Temp 97.7°F | Wt 189.2 lb

## 2015-04-20 DIAGNOSIS — I5030 Unspecified diastolic (congestive) heart failure: Secondary | ICD-10-CM | POA: Diagnosis not present

## 2015-04-20 DIAGNOSIS — R21 Rash and other nonspecific skin eruption: Secondary | ICD-10-CM | POA: Diagnosis not present

## 2015-04-20 DIAGNOSIS — R600 Localized edema: Secondary | ICD-10-CM

## 2015-04-20 MED ORDER — TORSEMIDE 20 MG PO TABS: 20.0000 mg | ORAL_TABLET | Freq: Every day | ORAL | Status: DC

## 2015-04-20 NOTE — Assessment & Plan Note (Addendum)
BLE dry skin dermatitis. Presumed from chemo.

## 2015-04-20 NOTE — Progress Notes (Signed)
Pre visit review using our clinic review tool, if applicable. No additional management support is needed unless otherwise documented below in the visit note. 

## 2015-04-20 NOTE — Assessment & Plan Note (Signed)
Improvement in pedal edema. Remains intermittently dyspneic chronically.

## 2015-04-20 NOTE — Progress Notes (Signed)
BP 130/84 mmHg  Pulse 82  Temp(Src) 97.7 F (36.5 C) (Oral)  Wt 189 lb 4 oz (85.843 kg)   CC: 1 wk f/u  Subjective:    Patient ID: Shawn Espinoza, male    DOB: 24-Sep-1941, 74 y.o.   MRN: [id number]  HPI: Shawn Espinoza is a 74 y.o. male presenting on 04/20/2015 for Follow-up   Saw Dr Diona Browner 04/13/2015 with worsening pedal edema to abdomen. Furosemide was changed to toresmide 26m bid and potassium 223m bid was also started. Complaint with meds. Presents for follow up. Taking torsemide at 8am and 1pm. Notes good urine output, especially at night time.   Noticed significant diuresis - 10 lbs in last week. + dyspnea but no chest pain. + fatigue and dizziness worse in afternoons. Some burning of legs along with persistent rash (ongoing over last several months). No leg cramping or palpitations or muscle pain.  Relevant past medical, surgical, family and social history reviewed and updated as indicated. Interim medical history since our last visit reviewed. Allergies and medications reviewed and updated. Current Outpatient Prescriptions on File Prior to Visit  Medication Sig  . albuterol (PROVENTIL HFA;VENTOLIN HFA) 108 (90 BASE) MCG/ACT inhaler Inhale 2 puffs into the lungs every 6 (six) hours as needed for wheezing or shortness of breath.  . Alum & Mag Hydroxide-Simeth (MAGIC MOUTHWASH W/LIDOCAINE) SOLN Take 5 mLs by mouth 3 (three) times daily as needed for mouth pain.  . Marland Kitchenpixaban (ELIQUIS) 5 MG TABS tablet Take 1 tablet (5 mg total) by mouth 2 (two) times daily.  . 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 cetirizine (ZYRTEC) 10 MG tablet Take 10 mg by mouth daily as needed for allergies or rhinitis.   . Marland Kitchenexamethasone (DECADRON) 4 MG tablet 4 MG BY MOUTH TWICE A DAY THE DAY BEFORE, DAY OF AND DAY AFTER THE CHEMOTHERAPY EVERY 3 WEEKS. (Patient taking differently: 4 MG BY MOUTH TWICE A DAY THE DAY  BEFORE, DAY OF AND DAY AFTER THE CHEMOTHERAPY AND THEN ONCE DAILY THEREAFTER.)  . diphenoxylate-atropine (LOMOTIL) 2.5-0.025 MG per tablet Take 2 tablets by mouth 4 (four) times daily as needed for diarrhea or loose stools.  . folic acid (FOLVITE) 1 MG tablet TAKE 1 TABLET BY MOUTH EVERY DAY  . glimepiride (AMARYL) 1 MG tablet   . glucose blood test strip Use as instructed to check blood sugar once daily and as needed. 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 HYDROcodone-acetaminophen (NORCO/VICODIN) 5-325 MG per tablet Take 1 tablet by mouth every 6 (six) hours as needed for moderate pain.  . Lancets MISC Use to check sugar once daily and as needed Dx: E1I78.6*ONE TOUCH DELICA**  . lidocaine-prilocaine (EMLA) cream Apply 1 application topically as needed.  . metoprolol tartrate (LOPRESSOR) 25 MG tablet Take 1 tablet (25 mg total) by mouth 2 (two) times daily.  . Multiple Vitamins-Minerals (ICAPS) CAPS Take by mouth 2 (two) times daily.  . Marland Kitchenystatin (MYCOSTATIN) 100000 UNIT/ML suspension Take 5 mLs (500,000 Units total) by mouth 3 (three) times daily.  . polyethylene glycol (MIRALAX / GLYCOLAX) packet Take 17 g by mouth daily.   . sertraline (ZOLOFT) 25 MG tablet Take 1 tablet (25 mg total) by mouth daily.  . sucralfate (CARAFATE) 1 GM/10ML suspension Take 1 g by mouth 4 (four) times daily as needed (reflux).  . furosemide (LASIX) 80 MG tablet   . LORazepam (ATIVAN) 0.5 MG tablet Take  0.5 mg by mouth 2 (two) times daily as needed for anxiety.   . methylPREDNISolone (MEDROL DOSEPAK) 4 MG tablet Take 4 mg by mouth as directed. follow package directions  . traMADol (ULTRAM) 50 MG tablet    No current facility-administered medications on file prior to visit.    Review of Systems Per HPI unless specifically indicated above     Objective:    BP 130/84 mmHg  Pulse 82  Temp(Src) 97.7 F (36.5 C) (Oral)  Wt 189 lb 4 oz (85.843 kg)  Wt Readings from Last 3 Encounters:  04/20/15 189 lb 4 oz (85.843 kg)  04/17/15 189 lb  4.8 oz (85.866 kg)  04/13/15 197 lb (89.359 kg)    Physical Exam  Constitutional: He appears well-developed and well-nourished. No distress.  HENT:  Mouth/Throat: Oropharynx is clear and moist. No oropharyngeal exudate.  Eyes: Conjunctivae are normal. Pupils are equal, round, and reactive to light.  S/p R cataract surgery. L cataract present  Cardiovascular: Normal rate, regular rhythm, normal heart sounds and intact distal pulses.   No murmur heard. Pulmonary/Chest: Effort normal and breath sounds normal. No respiratory distress. He has no wheezes. He has no rales.  Musculoskeletal: He exhibits edema (tr pedal edema).  Skin: Skin is warm and dry. Rash noted. There is erythema.  BLE with erythematous dry rash  Nursing note and vitals reviewed.  Results for orders placed or performed in visit on 04/17/15  CBC with Differential  Result Value Ref Range   WBC 4.3 4.0 - 10.3 10e3/uL   NEUT# 2.8 1.5 - 6.5 10e3/uL   HGB 10.0 (L) 13.0 - 17.1 g/dL   HCT 30.0 (L) 38.4 - 49.9 %   Platelets 112 (L) 140 - 400 10e3/uL   MCV 92.9 79.3 - 98.0 fL   MCH 31.0 27.2 - 33.4 pg   MCHC 33.3 32.0 - 36.0 g/dL   RBC 3.23 (L) 4.20 - 5.82 10e6/uL   RDW 16.4 (H) 11.0 - 14.6 %   lymph# 0.9 0.9 - 3.3 10e3/uL   MONO# 0.6 0.1 - 0.9 10e3/uL   Eosinophils Absolute 0.0 0.0 - 0.5 10e3/uL   Basophils Absolute 0.0 0.0 - 0.1 10e3/uL   NEUT% 65.0 39.0 - 75.0 %   LYMPH% 20.3 14.0 - 49.0 %   MONO% 13.8 0.0 - 14.0 %   EOS% 0.9 0.0 - 7.0 %   BASO% 0.0 0.0 - 2.0 %  Comprehensive metabolic panel  Result Value Ref Range   Sodium 138 136 - 145 mEq/L   Potassium 2.5 (LL) 3.5 - 5.1 mEq/L   Chloride 91 (L) 98 - 109 mEq/L   CO2 30 (H) 22 - 29 mEq/L   Glucose 195 (H) 70 - 140 mg/dl   BUN 25.1 7.0 - 26.0 mg/dL   Creatinine 1.4 (H) 0.7 - 1.3 mg/dL   Total Bilirubin 0.88 0.20 - 1.20 mg/dL   Alkaline Phosphatase 72 40 - 150 U/L   AST 23 5 - 34 U/L   ALT 20 0 - 55 U/L   Total Protein 6.2 (L) 6.4 - 8.3 g/dL   Albumin 3.2  (L) 3.5 - 5.0 g/dL   Calcium 8.6 8.4 - 10.4 mg/dL   Anion Gap 17 (H) 3 - 11 mEq/L   EGFR 49 (L) >90 ml/min/1.73 m2      Assessment & Plan:   Problem List Items Addressed This Visit    Skin rash    BLE dry skin dermatitis. Presumed from chemo.  Pedal edema - Primary    Markedly improved with change to torsemide however anticipate too strong dose currently - noted marked hypokalemia (pt denies sxs) and relative acute renal insufficiency on blood work from Monday - therefore will decrease torsemide to 34m once daily and continue potassium 28m BID daily. Has f/u labs on Tuesday prior to next chemotherapy session.      CHF (congestive heart failure)    Improvement in pedal edema. Remains intermittently dyspneic chronically.      Relevant Medications   torsemide (DEMADEX) 20 MG tablet       Follow up plan: Return in about 3 months (around 07/20/2015).

## 2015-04-20 NOTE — Assessment & Plan Note (Signed)
Markedly improved with change to torsemide however anticipate too strong dose currently - noted marked hypokalemia (pt denies sxs) and relative acute renal insufficiency on blood work from Monday - therefore will decrease torsemide to '20mg'$  once daily and continue potassium 31mq BID daily. Has f/u labs on Tuesday prior to next chemotherapy session.

## 2015-04-20 NOTE — Patient Instructions (Addendum)
Decrease torsemide to '20mg'$  once daily Continue potassium 25mq daily (2 tablets). Recheck blood work on Tuesday prior to chemo.

## 2015-04-21 ENCOUNTER — Ambulatory Visit: Payer: Medicare Other | Admitting: *Deleted

## 2015-04-21 ENCOUNTER — Telehealth: Payer: Self-pay | Admitting: *Deleted

## 2015-04-21 DIAGNOSIS — R52 Pain, unspecified: Secondary | ICD-10-CM

## 2015-04-21 NOTE — Progress Notes (Signed)
Patient ID: Shawn Espinoza, male   DOB: 08-09-1941, 74 y.o.   MRN: [id number] PATIENT PRESENTS FOR DIABETIC SHOE MEASUREMENT AND SCANNING FOR INSERTS

## 2015-04-21 NOTE — Telephone Encounter (Signed)
TC from pt's wife regarding future appts-esp. Labs.  Reviewed lab appts with her and chemo appts and she expressed understanding. Her main concern was keeping track of his potassium levels as it was very low the last time it was checked. He is currently taking replacement KCL w/o difficulty. No other needs identified.

## 2015-04-24 DIAGNOSIS — H2512 Age-related nuclear cataract, left eye: Secondary | ICD-10-CM | POA: Diagnosis not present

## 2015-04-24 DIAGNOSIS — H25012 Cortical age-related cataract, left eye: Secondary | ICD-10-CM | POA: Diagnosis not present

## 2015-04-25 ENCOUNTER — Ambulatory Visit (HOSPITAL_BASED_OUTPATIENT_CLINIC_OR_DEPARTMENT_OTHER): Payer: Medicare Other

## 2015-04-25 ENCOUNTER — Other Ambulatory Visit (HOSPITAL_BASED_OUTPATIENT_CLINIC_OR_DEPARTMENT_OTHER): Payer: Medicare Other

## 2015-04-25 ENCOUNTER — Ambulatory Visit: Payer: Medicare Other

## 2015-04-25 VITALS — BP 139/91 | HR 69 | Temp 97.7°F | Resp 18

## 2015-04-25 DIAGNOSIS — C3412 Malignant neoplasm of upper lobe, left bronchus or lung: Secondary | ICD-10-CM

## 2015-04-25 DIAGNOSIS — Z5111 Encounter for antineoplastic chemotherapy: Secondary | ICD-10-CM | POA: Diagnosis not present

## 2015-04-25 DIAGNOSIS — C3492 Malignant neoplasm of unspecified part of left bronchus or lung: Secondary | ICD-10-CM

## 2015-04-25 DIAGNOSIS — Z95828 Presence of other vascular implants and grafts: Secondary | ICD-10-CM

## 2015-04-25 LAB — COMPREHENSIVE METABOLIC PANEL (CC13)
ALT: 20 U/L (ref 0–55)
ANION GAP: 11 meq/L (ref 3–11)
AST: 19 U/L (ref 5–34)
Albumin: 3.2 g/dL — ABNORMAL LOW (ref 3.5–5.0)
Alkaline Phosphatase: 77 U/L (ref 40–150)
BILIRUBIN TOTAL: 0.55 mg/dL (ref 0.20–1.20)
BUN: 20.8 mg/dL (ref 7.0–26.0)
CO2: 25 meq/L (ref 22–29)
Calcium: 9.1 mg/dL (ref 8.4–10.4)
Chloride: 103 mEq/L (ref 98–109)
Creatinine: 1 mg/dL (ref 0.7–1.3)
EGFR: 72 mL/min/{1.73_m2} — AB (ref >90)
GLUCOSE: 301 mg/dL — AB (ref 70–140)
POTASSIUM: 4.7 meq/L (ref 3.5–5.1)
Sodium: 140 mEq/L (ref 136–145)
Total Protein: 6.2 g/dL — ABNORMAL LOW (ref 6.4–8.3)

## 2015-04-25 LAB — CBC WITH DIFFERENTIAL/PLATELET
BASO%: 0.1 % (ref 0.0–2.0)
BASOS ABS: 0 10*3/uL (ref 0.0–0.1)
EOS%: 0 % (ref 0.0–7.0)
Eosinophils Absolute: 0 10*3/uL (ref 0.0–0.5)
HCT: 32.1 % — ABNORMAL LOW (ref 38.4–49.9)
HEMOGLOBIN: 10.5 g/dL — AB (ref 13.0–17.1)
LYMPH#: 0.5 10*3/uL — AB (ref 0.9–3.3)
LYMPH%: 4.4 % — ABNORMAL LOW (ref 14.0–49.0)
MCH: 32.1 pg (ref 27.2–33.4)
MCHC: 32.7 g/dL (ref 32.0–36.0)
MCV: 98.2 fL — AB (ref 79.3–98.0)
MONO#: 0.6 10*3/uL (ref 0.1–0.9)
MONO%: 5.8 % (ref 0.0–14.0)
NEUT#: 9.4 10*3/uL — ABNORMAL HIGH (ref 1.5–6.5)
NEUT%: 89.7 % — AB (ref 39.0–75.0)
Platelets: 299 10*3/uL (ref 140–400)
RBC: 3.27 10*6/uL — ABNORMAL LOW (ref 4.20–5.82)
RDW: 19.2 % — AB (ref 11.0–14.6)
WBC: 10.4 10*3/uL — AB (ref 4.0–10.3)

## 2015-04-25 MED ORDER — SODIUM CHLORIDE 0.9 % IV SOLN
Freq: Once | INTRAVENOUS | Status: AC
  Administered 2015-04-25: 11:00:00 via INTRAVENOUS
  Filled 2015-04-25: qty 4

## 2015-04-25 MED ORDER — CYANOCOBALAMIN 1000 MCG/ML IJ SOLN
1000.0000 ug | Freq: Once | INTRAMUSCULAR | Status: AC
  Administered 2015-04-25: 1000 ug via INTRAMUSCULAR

## 2015-04-25 MED ORDER — SODIUM CHLORIDE 0.9 % IV SOLN
470.0000 mg/m2 | Freq: Once | INTRAVENOUS | Status: AC
  Administered 2015-04-25: 1000 mg via INTRAVENOUS
  Filled 2015-04-25: qty 40

## 2015-04-25 MED ORDER — SODIUM CHLORIDE 0.9 % IV SOLN
Freq: Once | INTRAVENOUS | Status: AC
  Administered 2015-04-25: 11:00:00 via INTRAVENOUS

## 2015-04-25 MED ORDER — SODIUM CHLORIDE 0.9 % IJ SOLN
10.0000 mL | INTRAMUSCULAR | Status: DC | PRN
  Administered 2015-04-25: 10 mL via INTRAVENOUS
  Filled 2015-04-25: qty 10

## 2015-04-25 MED ORDER — SODIUM CHLORIDE 0.9 % IJ SOLN
10.0000 mL | INTRAMUSCULAR | Status: DC | PRN
  Administered 2015-04-25: 10 mL
  Filled 2015-04-25: qty 10

## 2015-04-25 MED ORDER — HEPARIN SOD (PORK) LOCK FLUSH 100 UNIT/ML IV SOLN
500.0000 [IU] | Freq: Once | INTRAVENOUS | Status: AC | PRN
  Administered 2015-04-25: 500 [IU]
  Filled 2015-04-25: qty 5

## 2015-04-25 MED ORDER — CYANOCOBALAMIN 1000 MCG/ML IJ SOLN
INTRAMUSCULAR | Status: AC
  Filled 2015-04-25: qty 1

## 2015-04-25 NOTE — Patient Instructions (Signed)
[facility] Discharge Instructions for Patients Receiving Chemotherapy  Today you received the following chemotherapy agents Alimta.  To help prevent nausea and vomiting after your treatment, we encourage you to take your nausea medication as prescribed by your physician. If you develop nausea and vomiting that is not controlled by your nausea medication, call the clinic.   BELOW ARE SYMPTOMS THAT SHOULD BE REPORTED IMMEDIATELY:  *FEVER GREATER THAN 100.5 F  *CHILLS WITH OR WITHOUT FEVER  NAUSEA AND VOMITING THAT IS NOT CONTROLLED WITH YOUR NAUSEA MEDICATION  *UNUSUAL SHORTNESS OF BREATH  *UNUSUAL BRUISING OR BLEEDING  TENDERNESS IN MOUTH AND THROAT WITH OR WITHOUT PRESENCE OF ULCERS  *URINARY PROBLEMS  *BOWEL PROBLEMS  UNUSUAL RASH Items with * indicate a potential emergency and should be followed up as soon as possible.  Feel free to call the clinic you have any questions or concerns. The clinic phone number is [phone].  Please show the CHEMO ALERT CARD at check-in to the Emergency Department and triage nurse.   

## 2015-04-25 NOTE — Patient Instructions (Signed)

## 2015-04-26 ENCOUNTER — Telehealth: Payer: Self-pay

## 2015-04-26 NOTE — Telephone Encounter (Signed)
Kittie pts wife left v/m requesting cb with K results and further instructions on how pt should take torsemide and K tab.Please advise.

## 2015-04-26 NOTE — Telephone Encounter (Signed)
Patient & wife notified and verbalized understanding.  ° °

## 2015-04-26 NOTE — Addendum Note (Signed)
Addended by: [name] on: 04/26/2015 01:56 PM   Modules accepted: Orders

## 2015-04-26 NOTE — Telephone Encounter (Addendum)
plz notify - potassium is normal and kidneys are stable. Would continue torsemide '20mg'$  once daily as well as Kdur 81mq once daily (instead of BID) Notify [place] leg swelling deteriorating.

## 2015-05-01 ENCOUNTER — Telehealth: Payer: Self-pay | Admitting: Family Medicine

## 2015-05-01 NOTE — Telephone Encounter (Signed)
[facility] faxed order for an ONO order.  Form needs to be done as soon as possible.

## 2015-05-02 ENCOUNTER — Other Ambulatory Visit: Payer: Medicare Other

## 2015-05-02 ENCOUNTER — Encounter: Payer: Self-pay | Admitting: Family Medicine

## 2015-05-02 ENCOUNTER — Ambulatory Visit (INDEPENDENT_AMBULATORY_CARE_PROVIDER_SITE_OTHER): Payer: Medicare Other | Admitting: Family Medicine

## 2015-05-02 VITALS — BP 120/60 | HR 115 | Temp 99.4°F | Wt 193.0 lb

## 2015-05-02 DIAGNOSIS — I5030 Unspecified diastolic (congestive) heart failure: Secondary | ICD-10-CM

## 2015-05-02 DIAGNOSIS — R0609 Other forms of dyspnea: Secondary | ICD-10-CM

## 2015-05-02 DIAGNOSIS — C3492 Malignant neoplasm of unspecified part of left bronchus or lung: Secondary | ICD-10-CM

## 2015-05-02 MED ORDER — POTASSIUM CHLORIDE CRYS ER 10 MEQ PO TBCR: 20.0000 meq | EXTENDED_RELEASE_TABLET | Freq: Two times a day (BID) | ORAL | Status: DC

## 2015-05-02 MED ORDER — TORSEMIDE 20 MG PO TABS: 20.0000 mg | ORAL_TABLET | Freq: Two times a day (BID) | ORAL | Status: DC

## 2015-05-02 NOTE — Progress Notes (Signed)
BP 120/60 mmHg  Pulse 115  Temp(Src) 99.4 F (37.4 C) (Tympanic)  Wt 193 lb (87.544 kg)  SpO2 90%   CC: pedal edema  Subjective:    Patient ID: Shawn Espinoza, male    DOB: 1941/02/17, 74 y.o.   MRN: [id number]  HPI: Shawn Espinoza is a 74 y.o. male presenting on 05/02/2015 for Foot Swelling and Leg Swelling   Passed ambulatory pulse ox - will order overnight oximetry. Home oxygen ordered by Dr Velvet Bathe hospitalist. Pt doesn't find it is very helpful. Does not regularly use either.   See prior note for details - seen here 4/12 and again 4/28 with commencement of torsemide. 34m BID caused significant diuresis and improvement in dyspnea so this was changed to 238monce daily, potassium 2062monce daily was continued.   He notes good UOP with torsemide 12m36mily.   Leg erythema returning as well as aching.   Constipation after chemotherapy - pasty stool despite 1/2 capful miralax daily, sennakot daily. Stays gassy.   Stays fatigued, no motivation to do anything. Stays dyspneic and mildly dizzy. No chest pain. No coughing, UTI sxs, or skin infection sxs.   Getting chemo every 3 weeks by onc.   Relevant past medical, surgical, family and social history reviewed and updated as indicated. Interim medical history since our last visit reviewed. Allergies and medications reviewed and updated. Current Outpatient Prescriptions on File Prior to Visit  Medication Sig  . albuterol (PROVENTIL HFA;VENTOLIN HFA) 108 (90 BASE) MCG/ACT inhaler Inhale 2 puffs into the lungs every 6 (six) hours as needed for wheezing or shortness of breath.  . Alum & Mag Hydroxide-Simeth (MAGIC MOUTHWASH W/LIDOCAINE) SOLN Take 5 mLs by mouth 3 (three) times daily as needed for mouth pain.  . apMarland Kitchenxaban (ELIQUIS) 5 MG TABS tablet Take 1 tablet (5 mg total) by mouth 2 (two) times daily.  . cetirizine (ZYRTEC) 10 MG tablet Take 10 mg by mouth daily as needed for allergies or rhinitis.   . deMarland Kitchenamethasone  (DECADRON) 4 MG tablet 4 MG BY MOUTH TWICE A DAY THE DAY BEFORE, DAY OF AND DAY AFTER THE CHEMOTHERAPY EVERY 3 WEEKS. (Patient taking differently: 4 MG BY MOUTH TWICE A DAY THE DAY BEFORE, DAY OF AND DAY AFTER THE CHEMOTHERAPY AND THEN ONCE DAILY THEREAFTER.)  . diphenoxylate-atropine (LOMOTIL) 2.5-0.025 MG per tablet Take 2 tablets by mouth 4 (four) times daily as needed for diarrhea or loose stools.  . folic acid (FOLVITE) 1 MG tablet TAKE 1 TABLET BY MOUTH EVERY DAY  . glimepiride (AMARYL) 1 MG tablet Take 1 mg by mouth daily with breakfast.   . glucose blood test strip Use as instructed to check blood sugar once daily and as needed. 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 HYDROcodone-acetaminophen (NORCO/VICODIN) 5-325 MG per tablet Take 1 tablet by mouth every 6 (six) hours as needed for moderate pain.  . Lancets MISC Use to check sugar once daily and as needed Dx: E11.M76.7NE TOUCH DELICA**  . lidocaine-prilocaine (EMLA) cream Apply 1 application topically as needed.  . metoprolol tartrate (LOPRESSOR) 25 MG tablet Take 1 tablet (25 mg total) by mouth 2 (two) times daily.  . Multiple Vitamins-Minerals (ICAPS) CAPS Take by mouth 2 (two) times daily.  . polyethylene glycol (MIRALAX / GLYCOLAX) packet Take 17 g by mouth daily.    No current facility-administered medications on file prior to visit.    Review of Systems Per HPI unless specifically indicated above     Objective:  BP 120/60 mmHg  Pulse 115  Temp(Src) 99.4 F (37.4 C) (Tympanic)  Wt 193 lb (87.544 kg)  SpO2 90%  Wt Readings from Last 3 Encounters:  05/02/15 193 lb (87.544 kg)  04/20/15 189 lb 4 oz (85.843 kg)  04/17/15 189 lb 4.8 oz (85.866 kg)    Physical Exam  Constitutional: He appears well-developed and well-nourished. No distress.  HENT:  Mouth/Throat: Oropharynx is clear and moist. No oropharyngeal exudate.  Cardiovascular: Normal rate, regular rhythm, normal heart sounds and intact distal pulses.   No murmur heard. Pulmonary/Chest:  Effort normal and breath sounds normal. No respiratory distress. He has no wheezes. He has no rales.  Faint LLL crackles  Musculoskeletal: He exhibits edema (1+ pitting to knees).  Skin: Skin is warm and dry. There is erythema.  Erythematous rash bilateral lower extremities  Nursing note and vitals reviewed.  Results for orders placed or performed in visit on 04/25/15  CBC with Differential  Result Value Ref Range   WBC 10.4 (H) 4.0 - 10.3 10e3/uL   NEUT# 9.4 (H) 1.5 - 6.5 10e3/uL   HGB 10.5 (L) 13.0 - 17.1 g/dL   HCT 32.1 (L) 38.4 - 49.9 %   Platelets 299 140 - 400 10e3/uL   MCV 98.2 (H) 79.3 - 98.0 fL   MCH 32.1 27.2 - 33.4 pg   MCHC 32.7 32.0 - 36.0 g/dL   RBC 3.27 (L) 4.20 - 5.82 10e6/uL   RDW 19.2 (H) 11.0 - 14.6 %   lymph# 0.5 (L) 0.9 - 3.3 10e3/uL   MONO# 0.6 0.1 - 0.9 10e3/uL   Eosinophils Absolute 0.0 0.0 - 0.5 10e3/uL   Basophils Absolute 0.0 0.0 - 0.1 10e3/uL   NEUT% 89.7 (H) 39.0 - 75.0 %   LYMPH% 4.4 (L) 14.0 - 49.0 %   MONO% 5.8 0.0 - 14.0 %   EOS% 0.0 0.0 - 7.0 %   BASO% 0.1 0.0 - 2.0 %  Comprehensive metabolic panel  Result Value Ref Range   Sodium 140 136 - 145 mEq/L   Potassium 4.7 3.5 - 5.1 mEq/L   Chloride 103 98 - 109 mEq/L   CO2 25 22 - 29 mEq/L   Glucose 301 (H) 70 - 140 mg/dl   BUN 20.8 7.0 - 26.0 mg/dL   Creatinine 1.0 0.7 - 1.3 mg/dL   Total Bilirubin 0.55 0.20 - 1.20 mg/dL   Alkaline Phosphatase 77 40 - 150 U/L   AST 19 5 - 34 U/L   ALT 20 0 - 55 U/L   Total Protein 6.2 (L) 6.4 - 8.3 g/dL   Albumin 3.2 (L) 3.5 - 5.0 g/dL   Calcium 9.1 8.4 - 10.4 mg/dL   Anion Gap 11 3 - 11 mEq/L   EGFR 72 (L) >90 ml/min/1.73 m2      Assessment & Plan:   Problem List Items Addressed This Visit    DOE (dyspnea on exertion) - Primary    Nadir of 90% RA on ambulatory pulse ox. Will order ONO.      CHF (congestive heart failure)    Overall seems stable, but noticing pedal edema worsening. Will increase torsemide to 62m BID and increase potassium to 187m  2 tab BID. Check Cr, K next onc blood draw. Pt agrees with plan.      Relevant Medications   torsemide (DEMADEX) 20 MG tablet   Adenocarcinoma of left lung, stage 4       Follow up plan: Return in about 2 months (around 07/02/2015),  or as needed, for follow up visit.

## 2015-05-02 NOTE — Telephone Encounter (Signed)
Message left for Shawn Espinoza to return my call. Patient has appt today. Will do ambulatory pulse ox at that time if pt is able.

## 2015-05-02 NOTE — Assessment & Plan Note (Signed)
Overall seems stable, but noticing pedal edema worsening. Will increase torsemide to '20mg'$  BID and increase potassium to 46mq 2 tab BID. Check Cr, K next onc blood draw. Pt agrees with plan.

## 2015-05-02 NOTE — Telephone Encounter (Addendum)
I don't see that pt has had overnight oxymetry done. Can we check with patient and if not done, check with Trilby Drummer if this needs to be done prior to oxygen prescribed.  Also see who initially prescribed oxygen for patient - I don't remember doing this. Alternatively pt may come in for ambulatory pulse ox. Form in Kims' box.

## 2015-05-02 NOTE — Progress Notes (Signed)
Pre visit review using our clinic review tool, if applicable. No additional management support is needed unless otherwise documented below in the visit note. 

## 2015-05-02 NOTE — Patient Instructions (Addendum)
Let's increase torsemide to '20mg'$  twice daily (8am and 1pm). Increase potassium to 42mq twice daily as well.  We will schedule overnight oximetry to see if you still need oxygen at home. Change around miralax dosing to see if any improvement in constipation (1/2 capful every other day then full capful daily).

## 2015-05-02 NOTE — Assessment & Plan Note (Signed)
Nadir of 90% RA on ambulatory pulse ox. Will order ONO.

## 2015-05-02 NOTE — Telephone Encounter (Signed)
Trilby Drummer at [facility] returned Kim's call.  Call him back at [phone] ext [phone]. Patient can do exertion testing.  If it doesn't qualify he'll need the overnight oximetry.  Call him back about oxygen orders.

## 2015-05-03 NOTE — Telephone Encounter (Signed)
See other phone note

## 2015-05-03 NOTE — Telephone Encounter (Signed)
Message left advising Trilby Drummer that pt passed ambulatory pulse ox at office visit. Advised that paperwork should've been faxed back to him and if he didn't receive it to let me know.

## 2015-05-04 ENCOUNTER — Ambulatory Visit (INDEPENDENT_AMBULATORY_CARE_PROVIDER_SITE_OTHER): Payer: Medicare Other

## 2015-05-04 DIAGNOSIS — B351 Tinea unguium: Secondary | ICD-10-CM | POA: Diagnosis not present

## 2015-05-04 DIAGNOSIS — M79676 Pain in unspecified toe(s): Secondary | ICD-10-CM

## 2015-05-05 ENCOUNTER — Encounter: Payer: Self-pay | Admitting: Nurse Practitioner

## 2015-05-05 ENCOUNTER — Ambulatory Visit (HOSPITAL_BASED_OUTPATIENT_CLINIC_OR_DEPARTMENT_OTHER): Payer: Medicare Other

## 2015-05-05 ENCOUNTER — Telehealth: Payer: Self-pay | Admitting: *Deleted

## 2015-05-05 ENCOUNTER — Ambulatory Visit (HOSPITAL_BASED_OUTPATIENT_CLINIC_OR_DEPARTMENT_OTHER): Payer: Medicare Other | Admitting: Nurse Practitioner

## 2015-05-05 ENCOUNTER — Other Ambulatory Visit: Payer: Self-pay | Admitting: Family Medicine

## 2015-05-05 VITALS — BP 118/81 | HR 79 | Temp 98.4°F | Resp 28 | Wt 194.1 lb

## 2015-05-05 DIAGNOSIS — R609 Edema, unspecified: Secondary | ICD-10-CM

## 2015-05-05 DIAGNOSIS — R530 Neoplastic (malignant) related fatigue: Secondary | ICD-10-CM

## 2015-05-05 DIAGNOSIS — C3492 Malignant neoplasm of unspecified part of left bronchus or lung: Secondary | ICD-10-CM

## 2015-05-05 DIAGNOSIS — R21 Rash and other nonspecific skin eruption: Secondary | ICD-10-CM | POA: Diagnosis not present

## 2015-05-05 DIAGNOSIS — C3412 Malignant neoplasm of upper lobe, left bronchus or lung: Secondary | ICD-10-CM | POA: Diagnosis not present

## 2015-05-05 DIAGNOSIS — E8809 Other disorders of plasma-protein metabolism, not elsewhere classified: Secondary | ICD-10-CM | POA: Diagnosis not present

## 2015-05-05 DIAGNOSIS — D61810 Antineoplastic chemotherapy induced pancytopenia: Secondary | ICD-10-CM

## 2015-05-05 DIAGNOSIS — T451X5A Adverse effect of antineoplastic and immunosuppressive drugs, initial encounter: Secondary | ICD-10-CM

## 2015-05-05 LAB — COMPREHENSIVE METABOLIC PANEL (CC13)
ALK PHOS: 66 U/L (ref 40–150)
ALT: 24 U/L (ref 0–55)
AST: 20 U/L (ref 5–34)
Albumin: 2.9 g/dL — ABNORMAL LOW (ref 3.5–5.0)
Anion Gap: 13 mEq/L — ABNORMAL HIGH (ref 3–11)
BUN: 19.5 mg/dL (ref 7.0–26.0)
CO2: 29 meq/L (ref 22–29)
Calcium: 8.1 mg/dL — ABNORMAL LOW (ref 8.4–10.4)
Chloride: 101 mEq/L (ref 98–109)
Creatinine: 1 mg/dL (ref 0.7–1.3)
EGFR: 78 mL/min/{1.73_m2} — ABNORMAL LOW (ref >90)
Glucose: 229 mg/dl — ABNORMAL HIGH (ref 70–140)
POTASSIUM: 3.7 meq/L (ref 3.5–5.1)
Sodium: 142 mEq/L (ref 136–145)
Total Bilirubin: 1.05 mg/dL (ref 0.20–1.20)
Total Protein: 5.6 g/dL — ABNORMAL LOW (ref 6.4–8.3)

## 2015-05-05 LAB — CBC WITH DIFFERENTIAL/PLATELET
BASO%: 0 % (ref 0.0–2.0)
BASOS ABS: 0 10*3/uL (ref 0.0–0.1)
EOS%: 2.2 % (ref 0.0–7.0)
Eosinophils Absolute: 0 10*3/uL (ref 0.0–0.5)
HEMATOCRIT: 28.3 % — AB (ref 38.4–49.9)
HGB: 9.2 g/dL — ABNORMAL LOW (ref 13.0–17.1)
LYMPH#: 0.6 10*3/uL — AB (ref 0.9–3.3)
LYMPH%: 34.6 % (ref 14.0–49.0)
MCH: 31.3 pg (ref 27.2–33.4)
MCHC: 32.5 g/dL (ref 32.0–36.0)
MCV: 96.3 fL (ref 79.3–98.0)
MONO#: 0.4 10*3/uL (ref 0.1–0.9)
MONO%: 23.5 % — AB (ref 0.0–14.0)
NEUT%: 39.7 % (ref 39.0–75.0)
NEUTROS ABS: 0.7 10*3/uL — AB (ref 1.5–6.5)
NRBC: 0 % (ref 0–0)
Platelets: 58 10*3/uL — ABNORMAL LOW (ref 140–400)
RBC: 2.94 10*6/uL — ABNORMAL LOW (ref 4.20–5.82)
RDW: 16.4 % — ABNORMAL HIGH (ref 11.0–14.6)
WBC: 1.8 10*3/uL — AB (ref 4.0–10.3)

## 2015-05-05 NOTE — Progress Notes (Signed)
SYMPTOM MANAGEMENT CLINIC   HPI: Shawn Espinoza 74 y.o. male diagnosed with lung cancer.  Currently undergoing single agent Alimta chemotherapy.  Patient is complaining of some chronic bilateral lower extremity rash and edema.  Patient states that his bilateral lower extremities become very red; and then begin to peel.  Following the peeling of the skin to his legs; he then develops some thick, darkened skin.  Patient states that he just followed up with his primary care physician this past Tuesday, 05/02/2015.  Patient states that he was advised per his primary care physician that the chronic peripheral edema and erythematous rash to his legs is most likely secondary to his chemotherapy.  Patient continues to complain of chronic fatigue/weakness/deconditioning.  He states that he prefers to sleep in his bed most of each day.  Patient is mildly anemic with a hemoglobin of 9.2.  Patient was encouraged to get out of the bed and remain as active as possible.  Patient denies any recent fevers or chills.  HPI  ROS  Past Medical History  Diagnosis Date  . History of diabetes mellitus, type II     resolved with diet, h/o neuropathy  . Diverticulosis of colon   . Hyperlipidemia   . Hypertension   . Fatty liver 02/29/00    abd ultrasound  . Lower back pain   . Systolic murmur 8115    2Decho - normal LV fxn, EF 55%, mild AS, biatrial enlargement  . History of tobacco use quit 1990s  . 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 Personal history of colonic adenomas 07/06/2013  . Hypertensive retinopathy of both eyes 2015    mild  . 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 Dysrhythmia     Atrial fib (found 07/2014)  . Shortness of breath   . Pneumonia   . GERD (gastroesophageal reflux disease)     occasional  . Arthritis   . Neuropathy     . Constipation   . 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 Diabetes mellitus without complication     Type 2  . Depression     Past Surgical History  Procedure Laterality Date  . Colonoscopy  2004  . Colonoscopy  2014    tubular adenoma x1, mod diverticulosis ([place])  . Flexible bronchoscopy  02/2014    WNL  . Video bronchoscopy Bilateral 03/17/2014    Procedure: VIDEO BRONCHOSCOPY WITH FLUORO;  Surgeon: Tanda Rockers, MD;  Location: [facility];  Service: Cardiopulmonary;  Laterality: Bilateral;  . Chest tube insertion Left 08/26/2014    Procedure: INSERTION OF LEFT  PLEURAL DRAINAGE CATHETER;  Surgeon: Ivin Poot, MD;  Location: [facility];  Service: Thoracic;  Laterality: Left;  . Cataract extraction Right 03/2015    Dr [name]  . Portacath placement Right 03/31/2015    Procedure: INSERTION PORT-A-CATH;  Surgeon: Ivin Poot, MD;  Location: [facility];  Service: Thoracic;  Laterality: Right;  . Removal of pleural drainage catheter Left 03/31/2015    Procedure: REMOVAL OF PLEURAL DRAINAGE CATHETER;  Surgeon: Ivin Poot, MD;  Location: [facility];  Service: Thoracic;  Laterality: Left;    has Steroid-induced diabetes mellitus; HLD (hyperlipidemia); History of tobacco use; Essential hypertension; Hemorrhoids; DIVERTICULOSIS, COLON; FATTY LIVER DISEASE; Medicare annual  wellness visit, subsequent; Positive hepatitis C antibody test; Systolic murmur; Lower back pain; Polycythemia; BPH (benign prostatic hypertrophy); Right knee pain; Personal history of colonic adenoma; DOE (dyspnea on exertion); Adenocarcinoma of left lung, stage 4; Liver lesion; Recurrent left pleural effusion; Skin rash; Other pancytopenia; Hypoalbuminemia; Interstitial pneumonitis; CHF (congestive heart failure); Abrasion of anterior right lower leg; Thrombocytopenia; Pedal edema; Protein-calorie malnutrition; Orthostatic hypotension; Neoplastic malignant related fatigue; Atrial fibrillation,  unspecified; Abnormal TSH; Thrush; Hearing loss; Advanced care planning/counseling discussion; Low serum potassium level; Antineoplastic chemotherapy induced pancytopenia; and Peripheral edema on his problem list.    is allergic to candesartan cilexetil; diltiazem hcl; doxazosin mesylate; nifedipine; pravastatin; red yeast rice; rosuvastatin; sertraline; and simvastatin.    Medication List       This list is accurate as of: 05/05/15  2:31 PM.  Always use your most recent med list.               albuterol 108 (90 BASE) MCG/ACT inhaler  Commonly known as:  PROVENTIL HFA;VENTOLIN HFA  Inhale 2 puffs into the lungs every 6 (six) hours as needed for wheezing or shortness of breath.     apixaban 5 MG Tabs tablet  Commonly known as:  ELIQUIS  Take 1 tablet (5 mg total) by mouth 2 (two) times daily.     cetirizine 10 MG tablet  Commonly known as:  ZYRTEC  Take 10 mg by mouth daily as needed for allergies or rhinitis.     dexamethasone 4 MG tablet  Commonly known as:  DECADRON  4 MG BY MOUTH TWICE A DAY THE DAY BEFORE, DAY OF AND DAY AFTER THE CHEMOTHERAPY EVERY 3 WEEKS.     diphenoxylate-atropine 2.5-0.025 MG per tablet  Commonly known as:  LOMOTIL  Take 2 tablets by mouth 4 (four) times daily as needed for diarrhea or loose stools.     folic acid 1 MG tablet  Commonly known as:  FOLVITE  TAKE 1 TABLET BY MOUTH EVERY DAY     glimepiride 1 MG tablet  Commonly known as:  AMARYL  Take 1 mg by mouth daily with breakfast.     glucose blood test strip  Use as instructed to check blood sugar once daily and as needed. Dx: E09.9     HYDROcodone-acetaminophen 5-325 MG per tablet  Commonly known as:  NORCO/VICODIN  Take 1 tablet by mouth every 6 (six) hours as needed for moderate pain.     ICAPS Caps  Take by mouth 2 (two) times daily.     KLOR-CON M10 10 MEQ tablet  Generic drug:  potassium chloride     Lancets Misc  Use to check sugar once daily and as needed Dx: W10.9 **ONE  TOUCH DELICA**     lidocaine-prilocaine cream  Commonly known as:  EMLA  Apply 1 application topically as needed.     magic mouthwash w/lidocaine Soln  Take 5 mLs by mouth 3 (three) times daily as needed for mouth pain.     metoprolol tartrate 25 MG tablet  Commonly known as:  LOPRESSOR  Take 1 tablet (25 mg total) by mouth 2 (two) times daily.     polyethylene glycol packet  Commonly known as:  MIRALAX / GLYCOLAX  Take 17 g by mouth daily.     torsemide 20 MG tablet  Commonly known as:  DEMADEX  Take 1 tablet (20 mg total) by mouth 2 (two) times daily.         PHYSICAL EXAMINATION  Oncology Vitals 05/05/2015  05/02/2015 05/02/2015 04/25/2015 04/20/2015 04/17/2015 04/13/2015  Height - - - - - 172 cm 172 cm  Weight 88.043 kg - 87.544 kg - 85.843 kg 85.866 kg 89.359 kg  Weight (lbs) 194 lbs 2 oz - 193 lbs - 189 lbs 4 oz 189 lbs 5 oz 197 lbs  BMI (kg/m2) - - - - - 29.21 kg/m2 30.4 kg/m2  Temp 98.4 - 99.4 97.7 97.7 98.3 97.7  Pulse 79 115 89 69 82 105 96  Resp 28 - - 18 - 18 -  SpO2 - 90 97 100 - 99 96  BSA (m2) - - - - - 2.02 m2 2.06 m2   BP Readings from Last 3 Encounters:  05/05/15 118/81  05/02/15 120/60  04/25/15 139/91    Physical Exam  Constitutional: He is oriented to person, place, and time.  Patient appears fatigued and chronically ill.  HENT:  Head: Normocephalic and atraumatic.  Eyes: Conjunctivae and EOM are normal. Pupils are equal, round, and reactive to light. Right eye exhibits no discharge. Left eye exhibits no discharge. No scleral icterus.  Neck: Normal range of motion.  Pulmonary/Chest: No respiratory distress.  Lungs sounds essentially clear; the patient was noted to become short of breath with any exertion whatsoever.  Musculoskeletal: Normal range of motion. He exhibits edema. He exhibits no tenderness.  +2 edema to bilateral lower extremities.  Neurological: He is alert and oriented to person, place, and time. Gait normal.  Skin: Skin is warm and  dry. No rash noted. There is erythema. There is pallor.  On exam.-Patient does have +2 bilateral peripheral edema to his legs.  He also has bright red color- but no actual rash to his bilateral lower legs from the knees down.  He also has thickened, darkened areas that patient states occurred after his skin peeled the previous time.      Psychiatric: Affect normal.  Nursing note and vitals reviewed.   LABORATORY DATA:. Appointment on 05/05/2015  Component Date Value Ref Range Status  . WBC 05/05/2015 1.8* 4.0 - 10.3 10e3/uL Final  . NEUT# 05/05/2015 0.7* 1.5 - 6.5 10e3/uL Final  . HGB 05/05/2015 9.2* 13.0 - 17.1 g/dL Final  . HCT 05/05/2015 28.3* 38.4 - 49.9 % Final  . Platelets 05/05/2015 58* 140 - 400 10e3/uL Final  . MCV 05/05/2015 96.3  79.3 - 98.0 fL Final  . MCH 05/05/2015 31.3  27.2 - 33.4 pg Final  . MCHC 05/05/2015 32.5  32.0 - 36.0 g/dL Final  . RBC 05/05/2015 2.94* 4.20 - 5.82 10e6/uL Final  . RDW 05/05/2015 16.4* 11.0 - 14.6 % Final  . lymph# 05/05/2015 0.6* 0.9 - 3.3 10e3/uL Final  . MONO# 05/05/2015 0.4  0.1 - 0.9 10e3/uL Final  . Eosinophils Absolute 05/05/2015 0.0  0.0 - 0.5 10e3/uL Final  . Basophils Absolute 05/05/2015 0.0  0.0 - 0.1 10e3/uL Final  . NEUT% 05/05/2015 39.7  39.0 - 75.0 % Final  . LYMPH% 05/05/2015 34.6  14.0 - 49.0 % Final  . MONO% 05/05/2015 23.5* 0.0 - 14.0 % Final  . EOS% 05/05/2015 2.2  0.0 - 7.0 % Final  . BASO% 05/05/2015 0.0  0.0 - 2.0 % Final  . nRBC 05/05/2015 0  0 - 0 % Final  . Sodium 05/05/2015 142  136 - 145 mEq/L Final  . Potassium 05/05/2015 3.7  3.5 - 5.1 mEq/L Final  . Chloride 05/05/2015 101  98 - 109 mEq/L Final  . CO2 05/05/2015 29  22 - 29 mEq/L  Final  . Glucose 05/05/2015 229* 70 - 140 mg/dl Final  . BUN 05/05/2015 19.5  7.0 - 26.0 mg/dL Final  . Creatinine 05/05/2015 1.0  0.7 - 1.3 mg/dL Final  . Total Bilirubin 05/05/2015 1.05  0.20 - 1.20 mg/dL Final  . Alkaline Phosphatase 05/05/2015 66  40 - 150 U/L Final  . AST  05/05/2015 20  5 - 34 U/L Final  . ALT 05/05/2015 24  0 - 55 U/L Final  . Total Protein 05/05/2015 5.6* 6.4 - 8.3 g/dL Final  . Albumin 05/05/2015 2.9* 3.5 - 5.0 g/dL Final  . Calcium 05/05/2015 8.1* 8.4 - 10.4 mg/dL Final  . Anion Gap 05/05/2015 13* 3 - 11 mEq/L Final  . EGFR 05/05/2015 78* >90 ml/min/1.73 m2 Final   eGFR is calculated using the CKD-EPI Creatinine Equation (2009)     RADIOGRAPHIC STUDIES: No results found.  ASSESSMENT/PLAN:    Adenocarcinoma of left lung, stage 4 Patient last received his single agent Alimta chemotherapy on 04/25/2015.  Labs drawn today revealed pancytopenia with ANC of 0.7, hemoglobin 9.2, and platelet count 58.  Patient denies any recent fevers or chills.  He continues to complain of chronic fatigue and shortness of breath with any exertion whatsoever; but feels that his dyspnea is essentially stable at present.  He denies any worsening symptoms of easy bleeding or bruising.  After consulting with Dr. Benay Spice today-decision was made to withhold any Granix for growth factor support since patient is approximate 10 days post chemotherapy treatment.  We'll continue to monitor closely.  Patient is scheduled to return for labs and a restaging CT of the chest on 05/12/2015.  He is scheduled to return on 05/16/2015 for labs, follow up visit, and his next cycle of chemotherapy.  Of note-patient is requesting a return to having labs drawn on a weekly basis.  He feels that going 3 weeks in between his lab draws is too long.     Skin rash Patient is complaining of some chronic bilateral lower extremity rash and edema.  Patient states that his bilateral lower extremities become very red; and then begin to peel.  Following the peeling of the skin to his legs; he then develops some thick, darkened skin.  Patient states that he just followed up with his primary care physician this past Tuesday, 05/02/2015.  Patient states that he was advised per his primary care  physician that the chronic peripheral edema and erythematous rash to his legs is most likely secondary to his chemotherapy.  On exam.-Patient does have +2 bilateral peripheral edema to his legs.  He also has bright red color- but no actual rash to his bilateral lower legs from the knees down.  He also has thickened, darkened areas that patient states occurred after his skin peeled the previous time.  Alimta chemotherapy has a 10-14% risk of rash and desquamation listed as a side effect.  Discoloration of bilateral lower legs could very well be secondary to chemotherapy; but could also be associated with chronic peripheral edema, and diabetes as well.  Patient's wife states that she has been applying hydrocortisone cream only to patient's legs.  Advised patient to try Aquaphor ointment and/or a barrier-type cream to legs to see if that helps.     Hypoalbuminemia Albumin is decreased at 2.9.  Patient was encouraged to push protein in his diet is much as possible.   Neoplastic malignant related fatigue Patient continues to complain of chronic fatigue/weakness/deconditioning.  He states that he prefers to sleep  in his bed most of each day.  Patient is mildly anemic with a hemoglobin of 9.2.  Patient was encouraged to get out of the bed and remain as active as possible.   Antineoplastic chemotherapy induced pancytopenia Chemotherapy-induced pancytopenia with WBC 1.8, ANC 0.7, hemoglobin 9.2, and platelet count 58.  Patient was once again reminded of all, neutropenic cautions.  Patient was afebrile today while cancer Center.  While patient continues to complain of chronic fatigue and weakness; as well as shortness of breath with any type of exertion-patient feels that both are essentially stable at present.  Patient also denies any worsening issues with either easy bleeding or bruising.  Will continue to monitor closely.   Peripheral edema Patient has a history of chronic bilateral lower extremity  peripheral edema; and his primary care physician recently increased his torsemide to 20 mg twice daily.    Patient stated understanding of all instructions; and was in agreement with this plan of care. The patient knows to call the clinic with any problems, questions or concerns.   Review/collaboration with Dr.Sherrill (for Dr. [name]) regarding all aspects of patient's visit today.   Total time spent with patient was 40 minutes;  with greater than 75 percent of that time spent in face to face counseling regarding patient's symptoms,  and coordination of care and follow up.  Disclaimer: This note was dictated with voice recognition software. Similar sounding words can inadvertently be transcribed and may not be corrected upon review.   Drue Second, NP 05/05/2015

## 2015-05-05 NOTE — Assessment & Plan Note (Signed)
Patient continues to complain of chronic fatigue/weakness/deconditioning.  He states that he prefers to sleep in his bed most of each day.  Patient is mildly anemic with a hemoglobin of 9.2.  Patient was encouraged to get out of the bed and remain as active as possible.

## 2015-05-05 NOTE — Assessment & Plan Note (Signed)
Albumin is decreased at 2.9.  Patient was encouraged to push protein in his diet is much as possible.

## 2015-05-05 NOTE — Telephone Encounter (Signed)
TC from pt's wife stating that patient is feeling worse as far as his shortness of breath and lightheadedness. States he gets very dizzy when standing up and is preferring to stay in bed.  Also both of his legs are firey red in color from his knees to his toes and are painful with stinging and burning sensations.  He has seen his PCP this week and he recommended that [name] be seen @ [place]. Mrs. Livermore can get him here @ 10:45 for labs and then Selena Lesser, NP @ 11am in [facility]

## 2015-05-05 NOTE — Assessment & Plan Note (Addendum)
Patient is complaining of some chronic bilateral lower extremity rash and edema.  Patient states that his bilateral lower extremities become very red; and then begin to peel.  Following the peeling of the skin to his legs; he then develops some thick, darkened skin.  Patient states that he just followed up with his primary care physician this past Tuesday, 05/02/2015.  Patient states that he was advised per his primary care physician that the chronic peripheral edema and erythematous rash to his legs is most likely secondary to his chemotherapy.  On exam.-Patient does have +2 bilateral peripheral edema to his legs.  He also has bright red color- but no actual rash to his bilateral lower legs from the knees down.  He also has thickened, darkened areas that patient states occurred after his skin peeled the previous time.  Alimta chemotherapy has a 10-14% risk of rash and desquamation listed as a side effect.  Discoloration of bilateral lower legs could very well be secondary to chemotherapy; but could also be associated with chronic peripheral edema, and diabetes as well.  Patient's wife states that she has been applying hydrocortisone cream only to patient's legs.  Advised patient to try Aquaphor ointment and/or a barrier-type cream to legs to see if that helps.

## 2015-05-05 NOTE — Assessment & Plan Note (Signed)
Chemotherapy-induced pancytopenia with WBC 1.8, ANC 0.7, hemoglobin 9.2, and platelet count 58.  Patient was once again reminded of all, neutropenic cautions.  Patient was afebrile today while cancer Center.  While patient continues to complain of chronic fatigue and weakness; as well as shortness of breath with any type of exertion-patient feels that both are essentially stable at present.  Patient also denies any worsening issues with either easy bleeding or bruising.  Will continue to monitor closely.

## 2015-05-05 NOTE — Assessment & Plan Note (Signed)
Patient has a history of chronic bilateral lower extremity peripheral edema; and his primary care physician recently increased his torsemide to 20 mg twice daily.

## 2015-05-05 NOTE — Assessment & Plan Note (Signed)
Patient last received his single agent Alimta chemotherapy on 04/25/2015.  Labs drawn today revealed pancytopenia with ANC of 0.7, hemoglobin 9.2, and platelet count 58.  Patient denies any recent fevers or chills.  He continues to complain of chronic fatigue and shortness of breath with any exertion whatsoever; but feels that his dyspnea is essentially stable at present.  He denies any worsening symptoms of easy bleeding or bruising.  After consulting with Dr. Benay Spice today-decision was made to withhold any Granix for growth factor support since patient is approximate 10 days post chemotherapy treatment.  We'll continue to monitor closely.  Patient is scheduled to return for labs and a restaging CT of the chest on 05/12/2015.  He is scheduled to return on 05/16/2015 for labs, follow up visit, and his next cycle of chemotherapy.  Of note-patient is requesting a return to having labs drawn on a weekly basis.  He feels that going 3 weeks in between his lab draws is too long.

## 2015-05-09 ENCOUNTER — Other Ambulatory Visit: Payer: Medicare Other

## 2015-05-10 DIAGNOSIS — R0902 Hypoxemia: Secondary | ICD-10-CM | POA: Diagnosis not present

## 2015-05-10 DIAGNOSIS — R0602 Shortness of breath: Secondary | ICD-10-CM | POA: Diagnosis not present

## 2015-05-10 DIAGNOSIS — D0220 Carcinoma in situ of unspecified bronchus and lung: Secondary | ICD-10-CM | POA: Diagnosis not present

## 2015-05-11 ENCOUNTER — Other Ambulatory Visit: Payer: Medicare Other

## 2015-05-12 ENCOUNTER — Ambulatory Visit: Payer: Medicare Other

## 2015-05-12 ENCOUNTER — Other Ambulatory Visit (HOSPITAL_BASED_OUTPATIENT_CLINIC_OR_DEPARTMENT_OTHER): Payer: Medicare Other

## 2015-05-12 ENCOUNTER — Ambulatory Visit (HOSPITAL_COMMUNITY)
Admission: RE | Admit: 2015-05-12 | Discharge: 2015-05-12 | Disposition: A | Discharge status: Home / Self Care | Payer: Medicare Other | Source: Ambulatory Visit | Attending: Physician Assistant | Admitting: Physician Assistant

## 2015-05-12 ENCOUNTER — Encounter (HOSPITAL_COMMUNITY): Payer: Self-pay

## 2015-05-12 VITALS — BP 119/82 | HR 100

## 2015-05-12 DIAGNOSIS — Z87891 Personal history of nicotine dependence: Secondary | ICD-10-CM | POA: Insufficient documentation

## 2015-05-12 DIAGNOSIS — C3492 Malignant neoplasm of unspecified part of left bronchus or lung: Secondary | ICD-10-CM | POA: Insufficient documentation

## 2015-05-12 DIAGNOSIS — C3412 Malignant neoplasm of upper lobe, left bronchus or lung: Secondary | ICD-10-CM

## 2015-05-12 DIAGNOSIS — Z95828 Presence of other vascular implants and grafts: Secondary | ICD-10-CM

## 2015-05-12 DIAGNOSIS — C229 Malignant neoplasm of liver, not specified as primary or secondary: Secondary | ICD-10-CM | POA: Diagnosis not present

## 2015-05-12 DIAGNOSIS — R911 Solitary pulmonary nodule: Secondary | ICD-10-CM | POA: Diagnosis not present

## 2015-05-12 LAB — COMPREHENSIVE METABOLIC PANEL (CC13)
ALBUMIN: 2.9 g/dL — AB (ref 3.5–5.0)
ALT: 14 U/L (ref 0–55)
AST: 20 U/L (ref 5–34)
Alkaline Phosphatase: 71 U/L (ref 40–150)
Anion Gap: 11 mEq/L (ref 3–11)
BUN: 14.8 mg/dL (ref 7.0–26.0)
CO2: 27 mEq/L (ref 22–29)
CREATININE: 0.9 mg/dL (ref 0.7–1.3)
Calcium: 8.1 mg/dL — ABNORMAL LOW (ref 8.4–10.4)
Chloride: 100 mEq/L (ref 98–109)
EGFR: 80 mL/min/{1.73_m2} — AB (ref >90)
GLUCOSE: 171 mg/dL — AB (ref 70–140)
POTASSIUM: 3.8 meq/L (ref 3.5–5.1)
Sodium: 138 mEq/L (ref 136–145)
Total Bilirubin: 0.58 mg/dL (ref 0.20–1.20)
Total Protein: 5.8 g/dL — ABNORMAL LOW (ref 6.4–8.3)

## 2015-05-12 LAB — CBC WITH DIFFERENTIAL/PLATELET
BASO%: 0 % (ref 0.0–2.0)
Basophils Absolute: 0 10*3/uL (ref 0.0–0.1)
EOS%: 1.7 % (ref 0.0–7.0)
Eosinophils Absolute: 0.1 10*3/uL (ref 0.0–0.5)
HEMATOCRIT: 27.6 % — AB (ref 38.4–49.9)
HGB: 8.8 g/dL — ABNORMAL LOW (ref 13.0–17.1)
LYMPH%: 13.4 % — ABNORMAL LOW (ref 14.0–49.0)
MCH: 31.3 pg (ref 27.2–33.4)
MCHC: 31.9 g/dL — AB (ref 32.0–36.0)
MCV: 98.2 fL — AB (ref 79.3–98.0)
MONO#: 0.8 10*3/uL (ref 0.1–0.9)
MONO%: 15.7 % — AB (ref 0.0–14.0)
NEUT#: 3.3 10*3/uL (ref 1.5–6.5)
NEUT%: 69.2 % (ref 39.0–75.0)
PLATELETS: 217 10*3/uL (ref 140–400)
RBC: 2.81 10*6/uL — AB (ref 4.20–5.82)
RDW: 18.7 % — ABNORMAL HIGH (ref 11.0–14.6)
WBC: 4.8 10*3/uL (ref 4.0–10.3)
lymph#: 0.6 10*3/uL — ABNORMAL LOW (ref 0.9–3.3)

## 2015-05-12 MED ORDER — SODIUM CHLORIDE 0.9 % IJ SOLN
10.0000 mL | INTRAMUSCULAR | Status: DC | PRN
  Administered 2015-05-12: 10 mL via INTRAVENOUS
  Filled 2015-05-12: qty 10

## 2015-05-12 MED ORDER — HEPARIN SOD (PORK) LOCK FLUSH 100 UNIT/ML IV SOLN
500.0000 [IU] | Freq: Once | INTRAVENOUS | Status: AC
  Administered 2015-05-12: 500 [IU] via INTRAVENOUS
  Filled 2015-05-12: qty 5

## 2015-05-12 MED ORDER — IOHEXOL 300 MG/ML  SOLN
100.0000 mL | Freq: Once | INTRAMUSCULAR | Status: AC | PRN
Start: 2015-05-12 — End: 2015-05-12
  Administered 2015-05-12: 100 mL via INTRAVENOUS

## 2015-05-12 NOTE — Patient Instructions (Signed)

## 2015-05-15 NOTE — Telephone Encounter (Signed)
Spoke to [place] at [facility] and updated him on patient's overnight oximetry.

## 2015-05-15 NOTE — Telephone Encounter (Signed)
Received overnight oxymetry with desats below 88% and nadir 64%. Qualifies for home O2, rec during sleep. plz call and notify Trilby Drummer from [facility]

## 2015-05-16 ENCOUNTER — Encounter: Payer: Self-pay | Admitting: *Deleted

## 2015-05-16 ENCOUNTER — Other Ambulatory Visit (HOSPITAL_BASED_OUTPATIENT_CLINIC_OR_DEPARTMENT_OTHER): Payer: Medicare Other

## 2015-05-16 ENCOUNTER — Ambulatory Visit (HOSPITAL_BASED_OUTPATIENT_CLINIC_OR_DEPARTMENT_OTHER): Payer: Medicare Other | Admitting: Internal Medicine

## 2015-05-16 ENCOUNTER — Encounter: Payer: Self-pay | Admitting: Internal Medicine

## 2015-05-16 ENCOUNTER — Ambulatory Visit: Payer: Medicare Other

## 2015-05-16 ENCOUNTER — Ambulatory Visit (HOSPITAL_BASED_OUTPATIENT_CLINIC_OR_DEPARTMENT_OTHER): Payer: Medicare Other

## 2015-05-16 VITALS — BP 132/66 | HR 67 | Temp 97.5°F | Resp 18 | Ht 67.0 in | Wt 198.8 lb

## 2015-05-16 DIAGNOSIS — C3412 Malignant neoplasm of upper lobe, left bronchus or lung: Secondary | ICD-10-CM | POA: Diagnosis not present

## 2015-05-16 DIAGNOSIS — I4891 Unspecified atrial fibrillation: Secondary | ICD-10-CM

## 2015-05-16 DIAGNOSIS — R5383 Other fatigue: Secondary | ICD-10-CM | POA: Diagnosis not present

## 2015-05-16 DIAGNOSIS — R600 Localized edema: Secondary | ICD-10-CM

## 2015-05-16 DIAGNOSIS — C787 Secondary malignant neoplasm of liver and intrahepatic bile duct: Secondary | ICD-10-CM | POA: Diagnosis not present

## 2015-05-16 DIAGNOSIS — Z5112 Encounter for antineoplastic immunotherapy: Secondary | ICD-10-CM

## 2015-05-16 DIAGNOSIS — C3492 Malignant neoplasm of unspecified part of left bronchus or lung: Secondary | ICD-10-CM

## 2015-05-16 DIAGNOSIS — J90 Pleural effusion, not elsewhere classified: Secondary | ICD-10-CM | POA: Diagnosis not present

## 2015-05-16 DIAGNOSIS — Z95828 Presence of other vascular implants and grafts: Secondary | ICD-10-CM

## 2015-05-16 DIAGNOSIS — R0602 Shortness of breath: Secondary | ICD-10-CM

## 2015-05-16 LAB — CBC WITH DIFFERENTIAL/PLATELET
BASO%: 0 % (ref 0.0–2.0)
Basophils Absolute: 0 10*3/uL (ref 0.0–0.1)
EOS ABS: 0 10*3/uL (ref 0.0–0.5)
EOS%: 0 % (ref 0.0–7.0)
HCT: 29.2 % — ABNORMAL LOW (ref 38.4–49.9)
HEMOGLOBIN: 9.4 g/dL — AB (ref 13.0–17.1)
LYMPH%: 6.1 % — AB (ref 14.0–49.0)
MCH: 31.9 pg (ref 27.2–33.4)
MCHC: 32.2 g/dL (ref 32.0–36.0)
MCV: 99 fL — ABNORMAL HIGH (ref 79.3–98.0)
MONO#: 0.3 10*3/uL (ref 0.1–0.9)
MONO%: 4.6 % (ref 0.0–14.0)
NEUT#: 4.8 10*3/uL (ref 1.5–6.5)
NEUT%: 89.3 % — ABNORMAL HIGH (ref 39.0–75.0)
Platelets: 261 10*3/uL (ref 140–400)
RBC: 2.95 10*6/uL — ABNORMAL LOW (ref 4.20–5.82)
RDW: 18.8 % — ABNORMAL HIGH (ref 11.0–14.6)
WBC: 5.4 10*3/uL (ref 4.0–10.3)
lymph#: 0.3 10*3/uL — ABNORMAL LOW (ref 0.9–3.3)

## 2015-05-16 LAB — COMPREHENSIVE METABOLIC PANEL (CC13)
ALBUMIN: 3 g/dL — AB (ref 3.5–5.0)
ALT: 12 U/L (ref 0–55)
AST: 15 U/L (ref 5–34)
Alkaline Phosphatase: 74 U/L (ref 40–150)
Anion Gap: 14 mEq/L — ABNORMAL HIGH (ref 3–11)
BUN: 22 mg/dL (ref 7.0–26.0)
CALCIUM: 8.2 mg/dL — AB (ref 8.4–10.4)
CO2: 25 mEq/L (ref 22–29)
Chloride: 99 mEq/L (ref 98–109)
Creatinine: 1.1 mg/dL (ref 0.7–1.3)
EGFR: 67 mL/min/{1.73_m2} — ABNORMAL LOW (ref >90)
Glucose: 372 mg/dl — ABNORMAL HIGH (ref 70–140)
POTASSIUM: 4.2 meq/L (ref 3.5–5.1)
Sodium: 138 mEq/L (ref 136–145)
Total Bilirubin: 0.45 mg/dL (ref 0.20–1.20)
Total Protein: 5.9 g/dL — ABNORMAL LOW (ref 6.4–8.3)

## 2015-05-16 MED ORDER — HEPARIN SOD (PORK) LOCK FLUSH 100 UNIT/ML IV SOLN
500.0000 [IU] | Freq: Once | INTRAVENOUS | Status: AC | PRN
  Administered 2015-05-16: 500 [IU]
  Filled 2015-05-16: qty 5

## 2015-05-16 MED ORDER — SODIUM CHLORIDE 0.9 % IV SOLN
2.9000 mg/kg | Freq: Once | INTRAVENOUS | Status: AC
  Administered 2015-05-16: 260 mg via INTRAVENOUS
  Filled 2015-05-16: qty 26

## 2015-05-16 MED ORDER — SODIUM CHLORIDE 0.9 % IJ SOLN
10.0000 mL | INTRAMUSCULAR | Status: DC | PRN
  Administered 2015-05-16: 10 mL
  Filled 2015-05-16: qty 10

## 2015-05-16 MED ORDER — SODIUM CHLORIDE 0.9 % IJ SOLN
10.0000 mL | INTRAMUSCULAR | Status: DC | PRN
  Filled 2015-05-16: qty 10

## 2015-05-16 MED ORDER — SODIUM CHLORIDE 0.9 % IV SOLN
Freq: Once | INTRAVENOUS | Status: AC
  Administered 2015-05-16: 13:00:00 via INTRAVENOUS

## 2015-05-16 NOTE — Patient Instructions (Signed)
[facility] Discharge Instructions for Patients Receiving Chemotherapy  Today you received the following chemotherapy agents: Nivolumab  To help prevent nausea and vomiting after your treatment, we encourage you to take your nausea medication as prescribed.   If you develop nausea and vomiting that is not controlled by your nausea medication, call the clinic.   BELOW ARE SYMPTOMS THAT SHOULD BE REPORTED IMMEDIATELY:  *FEVER GREATER THAN 100.5 F  *CHILLS WITH OR WITHOUT FEVER  NAUSEA AND VOMITING THAT IS NOT CONTROLLED WITH YOUR NAUSEA MEDICATION  *UNUSUAL SHORTNESS OF BREATH  *UNUSUAL BRUISING OR BLEEDING  TENDERNESS IN MOUTH AND THROAT WITH OR WITHOUT PRESENCE OF ULCERS  *URINARY PROBLEMS  *BOWEL PROBLEMS  UNUSUAL RASH Items with * indicate a potential emergency and should be followed up as soon as possible.  Feel free to call the clinic you have any questions or concerns. The clinic phone number is [phone].  Please show the CHEMO ALERT CARD at check-in to the Emergency Department and triage nurse.      Nivolumab injection What is this medicine? NIVOLUMAB (nye VOL ue mab) is used to treat certain types of melanoma and lung cancer. This medicine may be used for other purposes; ask your health care provider or pharmacist if you have questions. COMMON BRAND NAME(S): Opdivo What should I tell my health care provider before I take this medicine? They need to know if you have any of these conditions: -eye disease, vision problems -history of pancreatitis -immune system problems -inflammatory bowel disease -kidney disease -liver disease -lung disease -lupus -myasthenia gravis -multiple sclerosis -organ transplant -stomach or intestine problems -thyroid disease -tingling of the fingers or toes, or other nerve disorder -an unusual or allergic reaction to nivolumab, other medicines, foods, dyes, or preservatives -pregnant or trying to get  pregnant -breast-feeding How should I use this medicine? This medicine is for infusion into a vein. It is given by a health care professional in a hospital or clinic setting. A special MedGuide will be given to you before each treatment. Be sure to read this information carefully each time. Talk to your pediatrician regarding the use of this medicine in children. Special care may be needed. Overdosage: If you think you've taken too much of this medicine contact a poison control center or emergency room at once. Overdosage: If you think you have taken too much of this medicine contact a poison control center or emergency room at once. NOTE: This medicine is only for you. Do not share this medicine with others. What if I miss a dose? It is important not to miss your dose. Call your doctor or health care professional if you are unable to keep an appointment. What may interact with this medicine? Interactions have not been studied. This list may not describe all possible interactions. Give your health care provider a list of all the medicines, herbs, non-prescription drugs, or dietary supplements you use. Also tell them if you smoke, drink alcohol, or use illegal drugs. Some items may interact with your medicine. What should I watch for while using this medicine? Tell your doctor or healthcare professional if your symptoms do not start to get better or if they get worse. Your condition will be monitored carefully while you are receiving this medicine. You may need blood work done while you are taking this medicine. What side effects may I notice from receiving this medicine? Side effects that you should report to your doctor or health care professional as soon as possible: -  allergic reactions like skin rash, itching or hives, swelling of the face, lips, or tongue -black, tarry stools -bloody or watery diarrhea -changes in vision -chills -cough -depressed mood -eye pain -feeling  anxious -fever -general ill feeling or flu-like symptoms -hair loss -loss of appetite -low blood counts - this medicine may decrease the number of white blood cells, red blood cells and platelets. You may be at increased risk for infections and bleeding -pain, tingling, numbness in the hands or feet -redness, blistering, peeling or loosening of the skin, including inside the mouth -red pinpoint spots on skin -signs of decreased platelets or bleeding - bruising, pinpoint red spots on the skin, black, tarry stools, blood in the urine -signs of decreased red blood cells - unusually weak or tired, feeling faint or lightheaded, falls -signs of infection - fever or chills, cough, sore throat, pain or trouble passing urine -signs and symptoms of a dangerous change in heartbeat or heart rhythm like chest pain; dizziness; fast or irregular heartbeat; palpitations; feeling faint or lightheaded, falls; breathing problems -signs and symptoms of high blood sugar such as dizziness; dry mouth; dry skin; fruity breath; nausea; stomach pain; increased hunger or thirst; increased urination -signs and symptoms of kidney injury like trouble passing urine or change in the amount of urine -signs and symptoms of liver injury like dark yellow or brown urine; general ill feeling or flu-like symptoms; light-colored stools; loss of appetite; nausea; right upper belly pain; unusually weak or tired; yellowing of the eyes or skin -signs and symptoms of increased potassium like muscle weakness; chest pain; or fast, irregular heartbeat -signs and symptoms of low potassium like muscle cramps or muscle pain; chest pain; dizziness; feeling faint or lightheaded, falls; palpitations; breathing problems; or fast, irregular heartbeat -swelling of the ankles, feet, hands -weight gainSide effects that usually do not require medical attention (report to your doctor or health care professional if they continue or are  bothersome): -constipation -general ill feeling or flu-like symptoms -hair loss -loss of appetite -nausea, vomiting This list may not describe all possible side effects. Call your doctor for medical advice about side effects. You may report side effects to FDA at 1-800-FDA-1088. Where should I keep my medicine? This drug is given in a hospital or clinic and will not be stored at home. NOTE: This sheet is a summary. It may not cover all possible information. If you have questions about this medicine, talk to your doctor, pharmacist, or health care provider.  2015, Elsevier/Gold Standard. (2014-02-28 13:18:19)  

## 2015-05-16 NOTE — Progress Notes (Signed)
Patient's flush appointment scheduled after MD appointment.  Desk nurse accessed and drew labs for chemo.

## 2015-05-16 NOTE — Progress Notes (Signed)
[facility] Telephone:([phone]) [phone]   Fax:(336) [place], [place] [address]  DIAGNOSIS: Stage IIIB/IV non-small cell lung cancer, adenocarcinoma diagnosed in March of 2015.  PRIOR THERAPY:  1) Systemic chemotherapy with carboplatin for AUC of 5 and Alimta 500 mg/M2 every 3 weeks. First dose expected on 04/13/2014. Status post 6 cycles. 2)  Maintenance chemotherapy with single agent Alimta 500 mg/M2 every 3 weeks. First cycle on 09/06/2014. Status post 3 cycles. 3) Second course of systemic chemotherapy with carboplatin for AUC of 5 and Alimta 500 MG/M2 every 3 weeks. First cycle 11/08/2014. Status post 6 cycles.  CURRENT THERAPY: Immunotherapy with Nivolumab 3 MG/KG every 2 weeks. First dose 05/24/2015.  INTERVAL HISTORY: Shawn Espinoza 74 y.o. male returns to the clinic today for follow up visit accompanied by his wife. He is tolerating his treatment well with no significant adverse effects except for increasing fatigue and weakness. He also had swelling of the lower extremities and was treated with Lasix. He denied having any significant chest pain but continues to have shortness of breath with exertion and mild cough with no hemoptysis. He has no significant fever or chills, no nausea or vomiting. He completed 6 cycles of systemic chemotherapy was carboplatin and Alimta. He has recent CT scan of the chest, abdomen and pelvis performed and he is here for evaluation and discussion of his scan results and further treatment options.  MEDICAL HISTORY: Past Medical History  Diagnosis Date  . History of diabetes mellitus, type II     resolved with diet, h/o neuropathy  . Diverticulosis of colon   . Hyperlipidemia   . Hypertension   . Fatty liver 02/29/00    abd ultrasound  . Lower back pain   . Systolic murmur 5427    2Decho - normal LV fxn, EF 55%, mild AS, biatrial enlargement  . History of  tobacco use quit 1990s  . 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 Personal history of colonic adenomas 07/06/2013  . Hypertensive retinopathy of both eyes 2015    mild  . 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 Dysrhythmia     Atrial fib (found 07/2014)  . Shortness of breath   . Pneumonia   . GERD (gastroesophageal reflux disease)     occasional  . Arthritis   . Neuropathy   . Constipation   . 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 Diabetes mellitus without complication     Type 2  . Depression     ALLERGIES:  is allergic to candesartan cilexetil; diltiazem hcl; doxazosin mesylate; nifedipine; pravastatin; red yeast rice; rosuvastatin; sertraline; and simvastatin.  MEDICATIONS:  Current Outpatient Prescriptions  Medication Sig Dispense Refill  . albuterol (PROVENTIL HFA;VENTOLIN HFA) 108 (90 BASE) MCG/ACT inhaler Inhale 2 puffs into the lungs every 6 (six) hours as needed for wheezing or shortness of breath. 1 Inhaler 0  . Alum & Mag Hydroxide-Simeth (MAGIC MOUTHWASH W/LIDOCAINE) SOLN Take 5 mLs by mouth 3 (three) times daily as needed for mouth pain. 5 mL 0  . apixaban (ELIQUIS) 5 MG TABS tablet Take 1 tablet (5 mg total) by mouth 2 (two) times daily. 60 tablet 6  .  cetirizine (ZYRTEC) 10 MG tablet Take 10 mg by mouth daily as needed for allergies or rhinitis.     Marland Kitchen dexamethasone (DECADRON) 4 MG tablet 4 MG BY MOUTH TWICE A DAY THE DAY BEFORE, DAY OF AND DAY AFTER THE CHEMOTHERAPY EVERY 3 WEEKS. (Patient taking differently: 4 MG BY MOUTH TWICE A DAY THE DAY BEFORE, DAY OF AND DAY AFTER THE CHEMOTHERAPY AND THEN ONCE DAILY THEREAFTER.) 40 tablet 1  . diphenoxylate-atropine (LOMOTIL) 2.5-0.025 MG per tablet Take 2 tablets by mouth 4 (four) times daily as needed for diarrhea or loose stools. 30 tablet 0  .  folic acid (FOLVITE) 1 MG tablet TAKE 1 TABLET BY MOUTH EVERY DAY 30 tablet 4  . glimepiride (AMARYL) 1 MG tablet Take 1 mg by mouth daily with breakfast.   11  . glucose blood test strip Use as instructed to check blood sugar once daily and as needed. Dx: E09.9 200 each 3  . HYDROcodone-acetaminophen (NORCO/VICODIN) 5-325 MG per tablet Take 1 tablet by mouth every 6 (six) hours as needed for moderate pain.    Marland Kitchen KLOR-CON M10 10 MEQ tablet   10  . Lancets MISC Use to check sugar once daily and as needed Dx: A19.3 **ONE TOUCH DELICA** 790 each 3  . lidocaine-prilocaine (EMLA) cream Apply 1 application topically as needed. 30 g 1  . metoprolol tartrate (LOPRESSOR) 25 MG tablet Take 1 tablet (25 mg total) by mouth 2 (two) times daily. 60 tablet 6  . Multiple Vitamins-Minerals (ICAPS) CAPS Take by mouth 2 (two) times daily.    . polyethylene glycol (MIRALAX / GLYCOLAX) packet Take 17 g by mouth daily.     Marland Kitchen torsemide (DEMADEX) 20 MG tablet Take 1 tablet (20 mg total) by mouth 2 (two) times daily. 60 tablet 11   No current facility-administered medications for this visit.   Facility-Administered Medications Ordered in Other Visits  Medication Dose Route Frequency Provider Last Rate Last Dose  . sodium chloride 0.9 % injection 10 mL  10 mL Intravenous PRN Curt Bears, MD        SURGICAL HISTORY:  Past Surgical History  Procedure Laterality Date  . Colonoscopy  2004  . Colonoscopy  2014    tubular adenoma x1, mod diverticulosis ([place])  . Flexible bronchoscopy  02/2014    WNL  . Video bronchoscopy Bilateral 03/17/2014    Procedure: VIDEO BRONCHOSCOPY WITH FLUORO;  Surgeon: Tanda Rockers, MD;  Location: [facility];  Service: Cardiopulmonary;  Laterality: Bilateral;  . Chest tube insertion Left 08/26/2014    Procedure: INSERTION OF LEFT  PLEURAL DRAINAGE CATHETER;  Surgeon: Ivin Poot, MD;  Location: [facility];  Service: Thoracic;  Laterality: Left;  . Cataract extraction Right 03/2015     Dr [name]  . Portacath placement Right 03/31/2015    Procedure: INSERTION PORT-A-CATH;  Surgeon: Ivin Poot, MD;  Location: [facility];  Service: Thoracic;  Laterality: Right;  . Removal of pleural drainage catheter Left 03/31/2015    Procedure: REMOVAL OF PLEURAL DRAINAGE CATHETER;  Surgeon: Ivin Poot, MD;  Location: [facility];  Service: Thoracic;  Laterality: Left;    REVIEW OF SYSTEMS:  Constitutional: positive for fatigue Eyes: negative Ears, nose, mouth, throat, and face: negative Respiratory: positive for cough and dyspnea on exertion Cardiovascular: negative Gastrointestinal: negative Genitourinary:negative Integument/breast: negative Hematologic/lymphatic: negative Musculoskeletal:negative Neurological: negative Behavioral/Psych: negative Endocrine: negative Allergic/Immunologic: negative   PHYSICAL EXAMINATION: General appearance: alert, cooperative, fatigued and no distress Head: Normocephalic, without obvious abnormality, atraumatic  Neck: no adenopathy, no JVD, supple, symmetrical, trachea midline and thyroid not enlarged, symmetric, no tenderness/mass/nodules Lymph nodes: Cervical, supraclavicular, and axillary nodes normal. Resp: diminished breath sounds LLL and dullness to percussion LLL Back: symmetric, no curvature. ROM normal. No CVA tenderness. Cardio: regular rate and rhythm, S1, S2 normal, no murmur, click, rub or gallop GI: soft, non-tender; bowel sounds normal; no masses,  no organomegaly Extremities: edema 2+ Neurologic: Alert and oriented X 3, normal strength and tone. Normal symmetric reflexes. Normal coordination and gait  ECOG PERFORMANCE STATUS: 1 - Symptomatic but completely ambulatory  Blood pressure 132/66, pulse 67, temperature 97.5 F (36.4 C), temperature source Oral, resp. rate 18, height '5\' 7"'$  (1.702 m), weight 198 lb 12.8 oz (90.175 kg), SpO2 98 %.  LABORATORY DATA: Lab Results  Component Value Date   WBC 4.8 05/12/2015   HGB 8.8*  05/12/2015   HCT 27.6* 05/12/2015   MCV 98.2* 05/12/2015   PLT 217 05/12/2015      Chemistry      Component Value Date/Time   NA 138 05/12/2015 0958   NA 140 04/07/2015 1039   K 3.8 05/12/2015 0958   K 3.7 04/07/2015 1039   CL 101 04/07/2015 1039   CO2 27 05/12/2015 0958   CO2 35* 04/07/2015 1039   BUN 14.8 05/12/2015 0958   BUN 35* 04/07/2015 1039   CREATININE 0.9 05/12/2015 0958   CREATININE 0.80 04/07/2015 1039   CREATININE 0.83 02/22/2014 1314      Component Value Date/Time   CALCIUM 8.1* 05/12/2015 0958   CALCIUM 8.7 04/07/2015 1039   ALKPHOS 71 05/12/2015 0958   ALKPHOS 68 08/13/2014 1222   AST 20 05/12/2015 0958   AST 21 08/13/2014 1222   ALT 14 05/12/2015 0958   ALT 15 08/13/2014 1222   BILITOT 0.58 05/12/2015 0958   BILITOT 1.3* 08/13/2014 1222       RADIOGRAPHIC STUDIES: Ct Chest W Contrast  05/12/2015   CLINICAL DATA:  Stage IV adenocarcinoma of left lung. Restaging. Diagnosed in 2015.  EXAM: CT CHEST, ABDOMEN, AND PELVIS WITH CONTRAST  TECHNIQUE: Multidetector CT imaging of the chest, abdomen and pelvis was performed following the standard protocol during bolus administration of intravenous contrast.  CONTRAST:  151m OMNIPAQUE IOHEXOL 300 MG/ML  SOLN  COMPARISON:  03/10/2015 and plain film 03/31/2015.  FINDINGS: CT CHEST FINDINGS  Mediastinum/Nodes: No supraclavicular adenopathy. A right-sided Port-A-Cath which terminates at the cavoatrial junction. Aortic and branch vessel atherosclerosis. Tortuous descending thoracic aorta. Mild cardiomegaly with LAD coronary artery atherosclerosis. No central pulmonary embolism, on this non-dedicated study. No mediastinal or hilar adenopathy. Mild distal esophageal wall thickening on image 44 is unchanged.  Lungs/Pleura: Trace left pleural fluid and thickening is slightly improved. The left-sided pleural drain has been removed. Minimal left pleural nodularity on image 35 measures on the order of 6 mm and is new.  Mild  centrilobular emphysema.  Left-sided bronchial wall thickening.  Sub solid right lower lobe 4 mm pulmonary nodule on image 37 is unchanged.  Redemonstration of lower lobe predominant the left-sided septal thickening, improved.  Musculoskeletal: No acute osseous abnormality.  CT ABDOMEN PELVIS FINDINGS  Hepatobiliary: Dominant hepatic dome lesion measures 6.2 x 4.3 cm on image 50 of series 2. 6.0 by 4.7 cm at the same level on the prior exam (when remeasured). On sagittal image 35, measures 5.2 x 3.3 cm today versus 6.3 x 4.1 cm on the prior. Findings overall suggest stability versus slight decrease in size.  A satellite lesion  just posteriorly in the hepatic dome is similar on image 47. No new liver lesions are seen. Prominence of the caudate lobe is chronic and nonspecific. Normal gallbladder, without biliary ductal dilatation.  Pancreas: Normal, without mass or ductal dilatation.  Spleen: Normal  Adrenals/Urinary Tract: Normal adrenal glands. Normal kidneys, without hydronephrosis. Normal urinary bladder.  Stomach/Bowel: Normal stomach, without wall thickening. Normal colon, appendix, and terminal ileum. Normal small bowel.  Vascular/Lymphatic: Aortic and branch vessel atherosclerosis. Borderline ectasia of both common iliac arteries is similar at up to 1.5 cm. No abdominopelvic adenopathy.  Reproductive: Mild prostatomegaly.  Other: No significant free fluid. No evidence of omental or peritoneal disease.  Musculoskeletal: Remote lower right rib trauma. Lumbosacral spondylosis .  IMPRESSION: 1. Similar to minimal improvement in hepatic metastatic burden. 2. Decrease in left-sided pleural fluid and thickening; removal of left chest tube. There is a subtle area of pleural nodularity which is new and warrants followup attention to exclude pleural metastasis. 3. chronic esophageal wall thickening is mild and suspicious for esophagitis. 4. Atherosclerosis, including within the coronary arteries. Bilateral borderline  ectasia of the common iliac arteries. 5. Nonspecific right lung sub solid pulmonary nodule, similar. 6. Chronic caudate lobe enlargement, indeterminate. Mild cirrhosis cannot be excluded.   Electronically Signed   By: Abigail Miyamoto M.D.   On: 05/12/2015 14:30   Ct Abdomen Pelvis W Contrast  05/12/2015   CLINICAL DATA:  Stage IV adenocarcinoma of left lung. Restaging. Diagnosed in 2015.  EXAM: CT CHEST, ABDOMEN, AND PELVIS WITH CONTRAST  TECHNIQUE: Multidetector CT imaging of the chest, abdomen and pelvis was performed following the standard protocol during bolus administration of intravenous contrast.  CONTRAST:  139m OMNIPAQUE IOHEXOL 300 MG/ML  SOLN  COMPARISON:  03/10/2015 and plain film 03/31/2015.  FINDINGS: CT CHEST FINDINGS  Mediastinum/Nodes: No supraclavicular adenopathy. A right-sided Port-A-Cath which terminates at the cavoatrial junction. Aortic and branch vessel atherosclerosis. Tortuous descending thoracic aorta. Mild cardiomegaly with LAD coronary artery atherosclerosis. No central pulmonary embolism, on this non-dedicated study. No mediastinal or hilar adenopathy. Mild distal esophageal wall thickening on image 44 is unchanged.  Lungs/Pleura: Trace left pleural fluid and thickening is slightly improved. The left-sided pleural drain has been removed. Minimal left pleural nodularity on image 35 measures on the order of 6 mm and is new.  Mild centrilobular emphysema.  Left-sided bronchial wall thickening.  Sub solid right lower lobe 4 mm pulmonary nodule on image 37 is unchanged.  Redemonstration of lower lobe predominant the left-sided septal thickening, improved.  Musculoskeletal: No acute osseous abnormality.  CT ABDOMEN PELVIS FINDINGS  Hepatobiliary: Dominant hepatic dome lesion measures 6.2 x 4.3 cm on image 50 of series 2. 6.0 by 4.7 cm at the same level on the prior exam (when remeasured). On sagittal image 35, measures 5.2 x 3.3 cm today versus 6.3 x 4.1 cm on the prior. Findings overall  suggest stability versus slight decrease in size.  A satellite lesion just posteriorly in the hepatic dome is similar on image 47. No new liver lesions are seen. Prominence of the caudate lobe is chronic and nonspecific. Normal gallbladder, without biliary ductal dilatation.  Pancreas: Normal, without mass or ductal dilatation.  Spleen: Normal  Adrenals/Urinary Tract: Normal adrenal glands. Normal kidneys, without hydronephrosis. Normal urinary bladder.  Stomach/Bowel: Normal stomach, without wall thickening. Normal colon, appendix, and terminal ileum. Normal small bowel.  Vascular/Lymphatic: Aortic and branch vessel atherosclerosis. Borderline ectasia of both common iliac arteries is similar at up to 1.5 cm. No abdominopelvic  adenopathy.  Reproductive: Mild prostatomegaly.  Other: No significant free fluid. No evidence of omental or peritoneal disease.  Musculoskeletal: Remote lower right rib trauma. Lumbosacral spondylosis .  IMPRESSION: 1. Similar to minimal improvement in hepatic metastatic burden. 2. Decrease in left-sided pleural fluid and thickening; removal of left chest tube. There is a subtle area of pleural nodularity which is new and warrants followup attention to exclude pleural metastasis. 3. chronic esophageal wall thickening is mild and suspicious for esophagitis. 4. Atherosclerosis, including within the coronary arteries. Bilateral borderline ectasia of the common iliac arteries. 5. Nonspecific right lung sub solid pulmonary nodule, similar. 6. Chronic caudate lobe enlargement, indeterminate. Mild cirrhosis cannot be excluded.   Electronically Signed   By: Abigail Miyamoto M.D.   On: 05/12/2015 14:30   ASSESSMENT AND PLAN: this is a very pleasant 74 years old white male with:  1) Stage IV non-small cell lung cancer, adenocarcinoma presenting with large right upper lobe lung mass in addition to mediastinal and bilateral hilar lymphadenopathy as well as cervical lymphadenopathy and left pleural  effusion. The patient completed a course systemic chemotherapy with carboplatin and Alimta status post 6 cycles. This was followed by 3 cycles of maintenance chemotherapy with single agent Alimta. The patient tolerated his treatment well. He had evidence for disease progression and the patient was started on treatment again with carboplatin and Alimta status post 6 cycles. His recent CT scan of the chest, abdomen and pelvis showed stable to minimal improvement in the metastatic liver lesions. There is no other evidence for disease progression. I had a lengthy discussion with the patient and his wife about his current condition and treatment options. The patient did not do well on maintenance treatment with single agent Alimta. I discussed with him other treatment options including treatment with immunotherapy with Nivolumab 3 MG/KG every 2 weeks versus other form of chemotherapy like docetaxel and Cyramza. The patient is interested in the immunotherapy. I discussed with him the adverse effect of this treatment including but not limited to immune mediated skin rash, diarrhea, liver, renal, thyroid or other endocrine dysfunction. He is expected to start the first cycle of this treatment next week. The patient would come back for follow-up visit in 3 weeks for reevaluation and management of any adverse effect of his treatment.  2) Atrial fibrillation: continue Aliquis.  3) lower extremity edema: Improved. He will continue Lasix on as-needed basis.  He was advised to call immediately if he has any concerning symptoms in the interval.  The patient voices understanding of current disease status and treatment options and is in agreement with the current care plan.  All questions were answered. The patient knows to call the clinic with any problems, questions or concerns. We can certainly see the patient much sooner if necessary.  Disclaimer: This note was dictated with voice recognition software.  Similar sounding words can inadvertently be transcribed and may not be corrected upon review.

## 2015-05-16 NOTE — [facility] Oncology Navigator Note (Unsigned)
Oncology Nurse Navigator Documentation  Oncology Nurse Navigator Flowsheets 05/16/2015  Navigator Encounter Type Other  Patient Visit Type Follow-up  Treatment Phase Other  Barriers/Navigation Needs Education.  Gave and explained information on new treatment regimen, Opdivo  Education Other  Interventions Education Method  Education Method Written  Support Groups/Services Friends and Family  Time Spent with Patient 15

## 2015-05-17 ENCOUNTER — Telehealth: Payer: Self-pay

## 2015-05-17 ENCOUNTER — Telehealth: Payer: Self-pay | Admitting: *Deleted

## 2015-05-17 ENCOUNTER — Telehealth: Payer: Self-pay | Admitting: Internal Medicine

## 2015-05-17 NOTE — Telephone Encounter (Signed)
lvm fo rpt regarding to 6.1 appt.Marland KitchenMarland KitchenMarland KitchenMarland Kitchenpt ok and aware

## 2015-05-17 NOTE — Telephone Encounter (Signed)
Wife called.  Patient was here yesterday and saw Dr. [name].  They are changing his chemotherapy to [place].  They were under the impression that he would still get his "old chemo" yesterday and then start his Opdivo on 05/23/15 and then q2 weeks.  His future appointments were scheduled under that plan.   However, he got his Opdivo yesterday, so his appointments are off scheduled. Please look at appointments and do new POF depending on how soon Dr. [name] wants to see pt.after his first Izard.

## 2015-05-17 NOTE — Telephone Encounter (Signed)
Noted. will monitor rash on new chemotherapeutic agent

## 2015-05-17 NOTE — Telephone Encounter (Signed)
Per staff message and POF I have scheduled appts. Advised scheduler of appts. JMW  

## 2015-05-17 NOTE — Telephone Encounter (Signed)
V/M left that [place] has changed chemo treatment and pt was advised to let PCP know that [place] is now using Opdivo. This is FYI and no cb needed.

## 2015-05-19 ENCOUNTER — Telehealth: Payer: Self-pay | Admitting: Internal Medicine

## 2015-05-19 ENCOUNTER — Telehealth: Payer: Self-pay | Admitting: Medical Oncology

## 2015-05-19 ENCOUNTER — Other Ambulatory Visit: Payer: Self-pay | Admitting: Medical Oncology

## 2015-05-19 NOTE — Telephone Encounter (Signed)
s.w. pt wife and advised on June appt changes....pt ok and aware....they will pick up new sched at nxt visit and they will ck my chart

## 2015-05-19 NOTE — Telephone Encounter (Signed)
Started nivolumab 5/24 so appts need to be readjusted. Onc tx request sent to scheduler.I left message for pt.

## 2015-05-19 NOTE — Telephone Encounter (Signed)
returned call and s.w. pt wife and r/s 6.7 appt to 6.6 due to pt having cateract surgery on 6.7.Marland KitchenMarland KitchenMarland Kitchenok and aware of d.t

## 2015-05-24 ENCOUNTER — Ambulatory Visit: Payer: Medicare Other

## 2015-05-24 ENCOUNTER — Other Ambulatory Visit: Payer: Medicare Other

## 2015-05-26 ENCOUNTER — Ambulatory Visit: Payer: Medicare Other | Admitting: *Deleted

## 2015-05-26 DIAGNOSIS — R52 Pain, unspecified: Secondary | ICD-10-CM

## 2015-05-26 NOTE — Progress Notes (Signed)
Patient ID: Shawn Espinoza, male   DOB: May 23, 1941, 74 y.o.   MRN: [id number] Patient presents for diabetic shoe pick up, shoes are tried on and are not a good fit shoe reads as a wide but does not fit like a wide.  We will reorder and call when they arrive.

## 2015-05-26 NOTE — Patient Instructions (Signed)

## 2015-05-29 ENCOUNTER — Ambulatory Visit (HOSPITAL_BASED_OUTPATIENT_CLINIC_OR_DEPARTMENT_OTHER): Payer: Medicare Other

## 2015-05-29 ENCOUNTER — Other Ambulatory Visit (HOSPITAL_BASED_OUTPATIENT_CLINIC_OR_DEPARTMENT_OTHER): Payer: Medicare Other

## 2015-05-29 ENCOUNTER — Ambulatory Visit: Payer: Medicare Other

## 2015-05-29 VITALS — BP 137/79 | HR 103 | Temp 97.9°F | Resp 18

## 2015-05-29 DIAGNOSIS — C787 Secondary malignant neoplasm of liver and intrahepatic bile duct: Secondary | ICD-10-CM

## 2015-05-29 DIAGNOSIS — C3412 Malignant neoplasm of upper lobe, left bronchus or lung: Secondary | ICD-10-CM

## 2015-05-29 DIAGNOSIS — Z5112 Encounter for antineoplastic immunotherapy: Secondary | ICD-10-CM | POA: Diagnosis present

## 2015-05-29 DIAGNOSIS — Z95828 Presence of other vascular implants and grafts: Secondary | ICD-10-CM

## 2015-05-29 DIAGNOSIS — R530 Neoplastic (malignant) related fatigue: Secondary | ICD-10-CM

## 2015-05-29 DIAGNOSIS — C3492 Malignant neoplasm of unspecified part of left bronchus or lung: Secondary | ICD-10-CM

## 2015-05-29 LAB — CBC WITH DIFFERENTIAL/PLATELET
BASO%: 0.4 % (ref 0.0–2.0)
Basophils Absolute: 0 10*3/uL (ref 0.0–0.1)
EOS%: 0.6 % (ref 0.0–7.0)
Eosinophils Absolute: 0.1 10*3/uL (ref 0.0–0.5)
HCT: 34.2 % — ABNORMAL LOW (ref 38.4–49.9)
HEMOGLOBIN: 10.8 g/dL — AB (ref 13.0–17.1)
LYMPH%: 10.7 % — ABNORMAL LOW (ref 14.0–49.0)
MCH: 31 pg (ref 27.2–33.4)
MCHC: 31.6 g/dL — ABNORMAL LOW (ref 32.0–36.0)
MCV: 98.3 fL — ABNORMAL HIGH (ref 79.3–98.0)
MONO#: 0.6 10*3/uL (ref 0.1–0.9)
MONO%: 7.4 % (ref 0.0–14.0)
NEUT%: 80.9 % — AB (ref 39.0–75.0)
NEUTROS ABS: 6.6 10*3/uL — AB (ref 1.5–6.5)
PLATELETS: 166 10*3/uL (ref 140–400)
RBC: 3.48 10*6/uL — ABNORMAL LOW (ref 4.20–5.82)
RDW: 15.8 % — ABNORMAL HIGH (ref 11.0–14.6)
WBC: 8.2 10*3/uL (ref 4.0–10.3)
lymph#: 0.9 10*3/uL (ref 0.9–3.3)

## 2015-05-29 LAB — COMPREHENSIVE METABOLIC PANEL (CC13)
ALK PHOS: 66 U/L (ref 40–150)
ALT: 14 U/L (ref 0–55)
AST: 22 U/L (ref 5–34)
Albumin: 2.8 g/dL — ABNORMAL LOW (ref 3.5–5.0)
Anion Gap: 9 mEq/L (ref 3–11)
BUN: 15.1 mg/dL (ref 7.0–26.0)
CALCIUM: 8.4 mg/dL (ref 8.4–10.4)
CO2: 31 mEq/L — ABNORMAL HIGH (ref 22–29)
CREATININE: 0.8 mg/dL (ref 0.7–1.3)
Chloride: 100 mEq/L (ref 98–109)
EGFR: 86 mL/min/{1.73_m2} — AB (ref >90)
Glucose: 224 mg/dl — ABNORMAL HIGH (ref 70–140)
Potassium: 3.2 mEq/L — ABNORMAL LOW (ref 3.5–5.1)
Sodium: 140 mEq/L (ref 136–145)
TOTAL PROTEIN: 5.7 g/dL — AB (ref 6.4–8.3)
Total Bilirubin: 0.59 mg/dL (ref 0.20–1.20)

## 2015-05-29 LAB — MAGNESIUM (CC13): Magnesium: 1.9 mg/dl (ref 1.5–2.5)

## 2015-05-29 MED ORDER — HEPARIN SOD (PORK) LOCK FLUSH 100 UNIT/ML IV SOLN
500.0000 [IU] | Freq: Once | INTRAVENOUS | Status: DC
  Filled 2015-05-29: qty 5

## 2015-05-29 MED ORDER — SODIUM CHLORIDE 0.9 % IJ SOLN
10.0000 mL | INTRAMUSCULAR | Status: DC | PRN
  Administered 2015-05-29: 10 mL via INTRAVENOUS
  Filled 2015-05-29: qty 10

## 2015-05-29 MED ORDER — SODIUM CHLORIDE 0.9 % IJ SOLN
10.0000 mL | INTRAMUSCULAR | Status: DC | PRN
  Filled 2015-05-29: qty 10

## 2015-05-29 MED ORDER — HEPARIN SOD (PORK) LOCK FLUSH 100 UNIT/ML IV SOLN
500.0000 [IU] | Freq: Once | INTRAVENOUS | Status: DC | PRN
Start: 2015-05-29 — End: 2015-05-29
  Filled 2015-05-29: qty 5

## 2015-05-29 MED ORDER — SODIUM CHLORIDE 0.9 % IV SOLN
3.0000 mg/kg | Freq: Once | INTRAVENOUS | Status: AC
  Administered 2015-05-29: 270 mg via INTRAVENOUS
  Filled 2015-05-29: qty 27

## 2015-05-29 MED ORDER — SODIUM CHLORIDE 0.9 % IV SOLN
Freq: Once | INTRAVENOUS | Status: AC
  Administered 2015-05-29: 12:00:00 via INTRAVENOUS

## 2015-05-29 NOTE — Progress Notes (Signed)
CMET reviewed with Shawn Metro, PA-C: Potassium 3.2, pt is taking Demadex BID and Potassium 20 meq BID. Order received for pt to take 40 meq Potassium BID for one week. Pt stated he will increase to 20 meq TID for a week, plans to hold his Demadex x 2 doses for cataract surgery.

## 2015-05-29 NOTE — Patient Instructions (Signed)

## 2015-05-29 NOTE — Patient Instructions (Signed)
[facility] Discharge Instructions for Patients Receiving Chemotherapy  Today you received the following chemotherapy agents: Nivolumab  To help prevent nausea and vomiting after your treatment, we encourage you to take your nausea medication as prescribed.   If you develop nausea and vomiting that is not controlled by your nausea medication, call the clinic.   BELOW ARE SYMPTOMS THAT SHOULD BE REPORTED IMMEDIATELY:  *FEVER GREATER THAN 100.5 F  *CHILLS WITH OR WITHOUT FEVER  NAUSEA AND VOMITING THAT IS NOT CONTROLLED WITH YOUR NAUSEA MEDICATION  *UNUSUAL SHORTNESS OF BREATH  *UNUSUAL BRUISING OR BLEEDING  TENDERNESS IN MOUTH AND THROAT WITH OR WITHOUT PRESENCE OF ULCERS  *URINARY PROBLEMS  *BOWEL PROBLEMS  UNUSUAL RASH Items with * indicate a potential emergency and should be followed up as soon as possible.  Feel free to call the clinic you have any questions or concerns. The clinic phone number is [phone].  Please show the CHEMO ALERT CARD at check-in to the Emergency Department and triage nurse.      Nivolumab injection What is this medicine? NIVOLUMAB (nye VOL ue mab) is used to treat certain types of melanoma and lung cancer. This medicine may be used for other purposes; ask your health care provider or pharmacist if you have questions. COMMON BRAND NAME(S): Opdivo What should I tell my health care provider before I take this medicine? They need to know if you have any of these conditions: -eye disease, vision problems -history of pancreatitis -immune system problems -inflammatory bowel disease -kidney disease -liver disease -lung disease -lupus -myasthenia gravis -multiple sclerosis -organ transplant -stomach or intestine problems -thyroid disease -tingling of the fingers or toes, or other nerve disorder -an unusual or allergic reaction to nivolumab, other medicines, foods, dyes, or preservatives -pregnant or trying to get  pregnant -breast-feeding How should I use this medicine? This medicine is for infusion into a vein. It is given by a health care professional in a hospital or clinic setting. A special MedGuide will be given to you before each treatment. Be sure to read this information carefully each time. Talk to your pediatrician regarding the use of this medicine in children. Special care may be needed. Overdosage: If you think you've taken too much of this medicine contact a poison control center or emergency room at once. Overdosage: If you think you have taken too much of this medicine contact a poison control center or emergency room at once. NOTE: This medicine is only for you. Do not share this medicine with others. What if I miss a dose? It is important not to miss your dose. Call your doctor or health care professional if you are unable to keep an appointment. What may interact with this medicine? Interactions have not been studied. This list may not describe all possible interactions. Give your health care provider a list of all the medicines, herbs, non-prescription drugs, or dietary supplements you use. Also tell them if you smoke, drink alcohol, or use illegal drugs. Some items may interact with your medicine. What should I watch for while using this medicine? Tell your doctor or healthcare professional if your symptoms do not start to get better or if they get worse. Your condition will be monitored carefully while you are receiving this medicine. You may need blood work done while you are taking this medicine. What side effects may I notice from receiving this medicine? Side effects that you should report to your doctor or health care professional as soon as possible: -  allergic reactions like skin rash, itching or hives, swelling of the face, lips, or tongue -black, tarry stools -bloody or watery diarrhea -changes in vision -chills -cough -depressed mood -eye pain -feeling  anxious -fever -general ill feeling or flu-like symptoms -hair loss -loss of appetite -low blood counts - this medicine may decrease the number of white blood cells, red blood cells and platelets. You may be at increased risk for infections and bleeding -pain, tingling, numbness in the hands or feet -redness, blistering, peeling or loosening of the skin, including inside the mouth -red pinpoint spots on skin -signs of decreased platelets or bleeding - bruising, pinpoint red spots on the skin, black, tarry stools, blood in the urine -signs of decreased red blood cells - unusually weak or tired, feeling faint or lightheaded, falls -signs of infection - fever or chills, cough, sore throat, pain or trouble passing urine -signs and symptoms of a dangerous change in heartbeat or heart rhythm like chest pain; dizziness; fast or irregular heartbeat; palpitations; feeling faint or lightheaded, falls; breathing problems -signs and symptoms of high blood sugar such as dizziness; dry mouth; dry skin; fruity breath; nausea; stomach pain; increased hunger or thirst; increased urination -signs and symptoms of kidney injury like trouble passing urine or change in the amount of urine -signs and symptoms of liver injury like dark yellow or brown urine; general ill feeling or flu-like symptoms; light-colored stools; loss of appetite; nausea; right upper belly pain; unusually weak or tired; yellowing of the eyes or skin -signs and symptoms of increased potassium like muscle weakness; chest pain; or fast, irregular heartbeat -signs and symptoms of low potassium like muscle cramps or muscle pain; chest pain; dizziness; feeling faint or lightheaded, falls; palpitations; breathing problems; or fast, irregular heartbeat -swelling of the ankles, feet, hands -weight gainSide effects that usually do not require medical attention (report to your doctor or health care professional if they continue or are  bothersome): -constipation -general ill feeling or flu-like symptoms -hair loss -loss of appetite -nausea, vomiting This list may not describe all possible side effects. Call your doctor for medical advice about side effects. You may report side effects to FDA at 1-800-FDA-1088. Where should I keep my medicine? This drug is given in a hospital or clinic and will not be stored at home. NOTE: This sheet is a summary. It may not cover all possible information. If you have questions about this medicine, talk to your doctor, pharmacist, or health care provider.  2015, Elsevier/Gold Standard. (2014-02-28 13:18:19)  

## 2015-05-30 ENCOUNTER — Encounter: Payer: Self-pay | Admitting: Family Medicine

## 2015-05-30 ENCOUNTER — Other Ambulatory Visit: Payer: Medicare Other

## 2015-05-30 ENCOUNTER — Ambulatory Visit: Payer: Medicare Other

## 2015-05-30 DIAGNOSIS — H2512 Age-related nuclear cataract, left eye: Secondary | ICD-10-CM | POA: Diagnosis not present

## 2015-05-31 ENCOUNTER — Telehealth: Payer: Self-pay | Admitting: *Deleted

## 2015-05-31 ENCOUNTER — Encounter (INDEPENDENT_AMBULATORY_CARE_PROVIDER_SITE_OTHER): Payer: Medicare Other | Admitting: Ophthalmology

## 2015-05-31 NOTE — Telephone Encounter (Signed)
Wife is calling.  Patient continues to have skin "rash" on both his legs.  She describes it at firey red that burns and stings.  It comes and goes but does not completely go away.  There are no blisters, only bumps with this.  It looks like sunburn.  He has had this for several months.  He saw Selena Lesser NP for this on 05/05/15.  His last alimta was 04/25/15.  She suggested acquaphor .  They have been using the acquaphor and continue to use the OTC cortisone cream on a mainly daily basis for several months.  Wife is wondering if she should continue to use the cortisone cream? Or if there is something else she should be doing.  Patient is no longer on alimta, but now on Opdivo. Please call wife back at [phone].

## 2015-06-01 NOTE — Telephone Encounter (Signed)
Reviewed concern with MD, pt to continue using creams as before, we cannot prescribe steroids as pt is on Opdivo. LMOVM of home and cell # Request pt or wife to call back with any concerns.

## 2015-06-07 ENCOUNTER — Other Ambulatory Visit: Payer: Medicare Other

## 2015-06-07 ENCOUNTER — Ambulatory Visit: Payer: Medicare Other | Admitting: Nurse Practitioner

## 2015-06-07 ENCOUNTER — Ambulatory Visit: Payer: Medicare Other

## 2015-06-08 ENCOUNTER — Encounter (INDEPENDENT_AMBULATORY_CARE_PROVIDER_SITE_OTHER): Payer: Medicare Other | Admitting: Ophthalmology

## 2015-06-09 ENCOUNTER — Encounter (INDEPENDENT_AMBULATORY_CARE_PROVIDER_SITE_OTHER): Payer: Medicare Other | Admitting: Ophthalmology

## 2015-06-09 DIAGNOSIS — H43813 Vitreous degeneration, bilateral: Secondary | ICD-10-CM | POA: Diagnosis not present

## 2015-06-09 DIAGNOSIS — H35033 Hypertensive retinopathy, bilateral: Secondary | ICD-10-CM | POA: Diagnosis not present

## 2015-06-09 DIAGNOSIS — I10 Essential (primary) hypertension: Secondary | ICD-10-CM | POA: Diagnosis not present

## 2015-06-09 DIAGNOSIS — H35371 Puckering of macula, right eye: Secondary | ICD-10-CM | POA: Diagnosis not present

## 2015-06-09 DIAGNOSIS — H3531 Nonexudative age-related macular degeneration: Secondary | ICD-10-CM | POA: Diagnosis not present

## 2015-06-09 DIAGNOSIS — H33302 Unspecified retinal break, left eye: Secondary | ICD-10-CM | POA: Diagnosis not present

## 2015-06-13 ENCOUNTER — Encounter: Payer: Self-pay | Admitting: Physician Assistant

## 2015-06-13 ENCOUNTER — Ambulatory Visit: Payer: Medicare Other

## 2015-06-13 ENCOUNTER — Ambulatory Visit (HOSPITAL_BASED_OUTPATIENT_CLINIC_OR_DEPARTMENT_OTHER): Payer: Medicare Other | Admitting: Physician Assistant

## 2015-06-13 ENCOUNTER — Telehealth: Payer: Self-pay | Admitting: Internal Medicine

## 2015-06-13 ENCOUNTER — Other Ambulatory Visit: Payer: Self-pay | Admitting: *Deleted

## 2015-06-13 ENCOUNTER — Ambulatory Visit (HOSPITAL_BASED_OUTPATIENT_CLINIC_OR_DEPARTMENT_OTHER): Payer: Medicare Other

## 2015-06-13 ENCOUNTER — Other Ambulatory Visit (HOSPITAL_BASED_OUTPATIENT_CLINIC_OR_DEPARTMENT_OTHER): Payer: Medicare Other

## 2015-06-13 VITALS — BP 136/83 | HR 71 | Temp 97.7°F | Resp 19 | Ht 67.0 in | Wt 197.8 lb

## 2015-06-13 DIAGNOSIS — Z95828 Presence of other vascular implants and grafts: Secondary | ICD-10-CM

## 2015-06-13 DIAGNOSIS — C3412 Malignant neoplasm of upper lobe, left bronchus or lung: Secondary | ICD-10-CM | POA: Diagnosis not present

## 2015-06-13 DIAGNOSIS — Z5112 Encounter for antineoplastic immunotherapy: Secondary | ICD-10-CM

## 2015-06-13 DIAGNOSIS — C3492 Malignant neoplasm of unspecified part of left bronchus or lung: Secondary | ICD-10-CM

## 2015-06-13 DIAGNOSIS — C787 Secondary malignant neoplasm of liver and intrahepatic bile duct: Secondary | ICD-10-CM

## 2015-06-13 DIAGNOSIS — Z79899 Other long term (current) drug therapy: Secondary | ICD-10-CM

## 2015-06-13 DIAGNOSIS — I4891 Unspecified atrial fibrillation: Secondary | ICD-10-CM | POA: Diagnosis not present

## 2015-06-13 DIAGNOSIS — R600 Localized edema: Secondary | ICD-10-CM | POA: Diagnosis not present

## 2015-06-13 LAB — COMPREHENSIVE METABOLIC PANEL (CC13)
ALT: 14 U/L (ref 0–55)
ANION GAP: 8 meq/L (ref 3–11)
AST: 20 U/L (ref 5–34)
Albumin: 2.8 g/dL — ABNORMAL LOW (ref 3.5–5.0)
Alkaline Phosphatase: 64 U/L (ref 40–150)
BUN: 17 mg/dL (ref 7.0–26.0)
CALCIUM: 8.5 mg/dL (ref 8.4–10.4)
CO2: 32 meq/L — AB (ref 22–29)
Chloride: 102 mEq/L (ref 98–109)
Creatinine: 0.8 mg/dL (ref 0.7–1.3)
EGFR: 88 mL/min/{1.73_m2} — ABNORMAL LOW (ref >90)
GLUCOSE: 178 mg/dL — AB (ref 70–140)
Potassium: 3.5 mEq/L (ref 3.5–5.1)
Sodium: 141 mEq/L (ref 136–145)
TOTAL PROTEIN: 5.7 g/dL — AB (ref 6.4–8.3)
Total Bilirubin: 0.48 mg/dL (ref 0.20–1.20)

## 2015-06-13 LAB — CBC WITH DIFFERENTIAL/PLATELET
BASO%: 1 % (ref 0.0–2.0)
Basophils Absolute: 0.1 10*3/uL (ref 0.0–0.1)
EOS ABS: 0.2 10*3/uL (ref 0.0–0.5)
EOS%: 3.8 % (ref 0.0–7.0)
HEMATOCRIT: 34.8 % — AB (ref 38.4–49.9)
HGB: 11.2 g/dL — ABNORMAL LOW (ref 13.0–17.1)
LYMPH%: 11.3 % — ABNORMAL LOW (ref 14.0–49.0)
MCH: 29.7 pg (ref 27.2–33.4)
MCHC: 32.2 g/dL (ref 32.0–36.0)
MCV: 92.3 fL (ref 79.3–98.0)
MONO#: 0.6 10*3/uL (ref 0.1–0.9)
MONO%: 10 % (ref 0.0–14.0)
NEUT%: 73.9 % (ref 39.0–75.0)
NEUTROS ABS: 4.3 10*3/uL (ref 1.5–6.5)
Platelets: 207 10*3/uL (ref 140–400)
RBC: 3.77 10*6/uL — ABNORMAL LOW (ref 4.20–5.82)
RDW: 16.3 % — AB (ref 11.0–14.6)
WBC: 5.8 10*3/uL (ref 4.0–10.3)
lymph#: 0.7 10*3/uL — ABNORMAL LOW (ref 0.9–3.3)

## 2015-06-13 LAB — TSH CHCC: TSH: 4.559 m(IU)/L — ABNORMAL HIGH (ref 0.320–4.118)

## 2015-06-13 MED ORDER — SODIUM CHLORIDE 0.9 % IV SOLN
250.0000 mL | Freq: Once | INTRAVENOUS | Status: AC
  Administered 2015-06-13: 250 mL via INTRAVENOUS

## 2015-06-13 MED ORDER — NIVOLUMAB CHEMO INJECTION 100 MG/10ML
3.0000 mg/kg | Freq: Once | INTRAVENOUS | Status: AC
  Administered 2015-06-13: 270 mg via INTRAVENOUS
  Filled 2015-06-13: qty 27

## 2015-06-13 MED ORDER — HEPARIN SOD (PORK) LOCK FLUSH 100 UNIT/ML IV SOLN
500.0000 [IU] | Freq: Once | INTRAVENOUS | Status: AC | PRN
  Administered 2015-06-13: 500 [IU]
  Filled 2015-06-13: qty 5

## 2015-06-13 MED ORDER — HEPARIN SOD (PORK) LOCK FLUSH 100 UNIT/ML IV SOLN
500.0000 [IU] | Freq: Once | INTRAVENOUS | Status: DC
  Filled 2015-06-13: qty 5

## 2015-06-13 MED ORDER — SODIUM CHLORIDE 0.9 % IJ SOLN
10.0000 mL | INTRAMUSCULAR | Status: DC | PRN
  Administered 2015-06-13: 10 mL via INTRAVENOUS
  Filled 2015-06-13: qty 10

## 2015-06-13 MED ORDER — SODIUM CHLORIDE 0.9 % IJ SOLN
10.0000 mL | INTRAMUSCULAR | Status: DC | PRN
  Administered 2015-06-13: 10 mL
  Filled 2015-06-13: qty 10

## 2015-06-13 NOTE — Progress Notes (Addendum)
[facility] [phone]   [phone] [place], [place] [address]  DIAGNOSIS: Stage IIIB/IV non-small cell lung cancer, adenocarcinoma diagnosed in March of 2015.  PRIOR THERAPY:  1) Systemic chemotherapy with carboplatin for AUC of 5 and Alimta 500 mg/M2 every 3 weeks. First dose expected on 04/13/2014. Status post 6 cycles. 2)  Maintenance chemotherapy with single agent Alimta 500 mg/M2 every 3 weeks. First cycle on 09/06/2014. Status post 3 cycles. 3) Second course of systemic chemotherapy with carboplatin for AUC of 5 and Alimta 500 MG/M2 every 3 weeks. First cycle 11/08/2014. Status post 6 cycles.  CURRENT THERAPY: Immunotherapy with Nivolumab 3 MG/KG every 2 weeks. First dose 05/24/2015. Status post 2 cycles.  INTERVAL HISTORY: Shawn Espinoza 74 y.o. male returns to the clinic today for follow up visit.  He is tolerating his treatment well with no significant adverse effects except for increasing fatigue and weakness. He also had swelling of the lower extremities and was treated with Lasix. The lower extremity edema is about the same. His legs tend to be somewhat sensitive however the redness and swelling tends to come and go. He continues to have some swelling about the knees and his waistline. He denied having any significant chest pain but continues to have shortness of breath with exertion and mild cough with no hemoptysis. He has no significant fever or chills, no nausea or vomiting. He denied any diarrhea or skin rash. He presents to proceed with cycle #3 of his immunotherapy.  MEDICAL HISTORY: Past Medical History  Diagnosis Date  . History of diabetes mellitus, type II     resolved with diet, h/o neuropathy  . Diverticulosis of colon   . Hyperlipidemia   . Hypertension   . Fatty liver 02/29/00    abd ultrasound  . Lower back pain   . Systolic murmur 7673    2Decho -  normal LV fxn, EF 55%, mild AS, biatrial enlargement  . History of tobacco use quit 1990s  . 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 Personal history of colonic adenomas 07/06/2013  . Hypertensive retinopathy of both eyes 2015    mild  . 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 Dysrhythmia     Atrial fib (found 07/2014)  . Shortness of breath   . Pneumonia   . GERD (gastroesophageal reflux disease)     occasional  . Arthritis   . Neuropathy   . Constipation   . 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 Diabetes mellitus without complication     Type 2  . Depression     ALLERGIES:  is allergic to candesartan cilexetil; diltiazem hcl; doxazosin mesylate; nifedipine; pravastatin; red yeast rice; rosuvastatin; sertraline; and simvastatin.  MEDICATIONS:  Current Outpatient Prescriptions  Medication Sig Dispense Refill  . albuterol (PROVENTIL HFA;VENTOLIN HFA) 108 (90 BASE) MCG/ACT inhaler Inhale 2 puffs into the lungs every 6 (six) hours as needed for wheezing or shortness of breath. 1 Inhaler 0  . Alum & Mag Hydroxide-Simeth (MAGIC MOUTHWASH W/LIDOCAINE) SOLN Take 5 mLs by mouth 3 (three) times daily as needed for mouth pain. 5 mL 0  . apixaban (ELIQUIS) 5 MG TABS tablet Take  1 tablet (5 mg total) by mouth 2 (two) times daily. 60 tablet 6  . cetirizine (ZYRTEC) 10 MG tablet Take 10 mg by mouth daily as needed for allergies or rhinitis.     Marland Kitchen diphenoxylate-atropine (LOMOTIL) 2.5-0.025 MG per tablet Take 2 tablets by mouth 4 (four) times daily as needed for diarrhea or loose stools. 30 tablet 0  . folic acid (FOLVITE) 1 MG tablet TAKE 1 TABLET BY MOUTH EVERY DAY 30 tablet 4  . glimepiride (AMARYL) 1 MG tablet Take 1 mg by mouth daily with breakfast.   11  . glucose blood test strip Use as instructed to check blood  sugar once daily and as needed. Dx: E09.9 200 each 3  . HYDROcodone-acetaminophen (NORCO/VICODIN) 5-325 MG per tablet Take 1 tablet by mouth every 6 (six) hours as needed for moderate pain.    Marland Kitchen KLOR-CON M10 10 MEQ tablet   10  . Lancets MISC Use to check sugar once daily and as needed Dx: T61.4 **ONE TOUCH DELICA** 431 each 3  . lidocaine-prilocaine (EMLA) cream Apply 1 application topically as needed. 30 g 1  . metoprolol tartrate (LOPRESSOR) 25 MG tablet Take 1 tablet (25 mg total) by mouth 2 (two) times daily. 60 tablet 6  . Multiple Vitamins-Minerals (ICAPS) CAPS Take by mouth 2 (two) times daily.    . polyethylene glycol (MIRALAX / GLYCOLAX) packet Take 17 g by mouth daily.     Marland Kitchen torsemide (DEMADEX) 20 MG tablet Take 1 tablet (20 mg total) by mouth 2 (two) times daily. 60 tablet 11  . dexamethasone (DECADRON) 4 MG tablet 4 MG BY MOUTH TWICE A DAY THE DAY BEFORE, DAY OF AND DAY AFTER THE CHEMOTHERAPY EVERY 3 WEEKS. (Patient not taking: Reported on 06/13/2015) 40 tablet 1   No current facility-administered medications for this visit.   Facility-Administered Medications Ordered in Other Visits  Medication Dose Route Frequency Provider Last Rate Last Dose  . sodium chloride 0.9 % injection 10 mL  10 mL Intracatheter PRN Curt Bears, MD   10 mL at 06/13/15 1426    SURGICAL HISTORY:  Past Surgical History  Procedure Laterality Date  . Colonoscopy  2004  . Colonoscopy  2014    tubular adenoma x1, mod diverticulosis ([place])  . Flexible bronchoscopy  02/2014    WNL  . Video bronchoscopy Bilateral 03/17/2014    Procedure: VIDEO BRONCHOSCOPY WITH FLUORO;  Surgeon: Tanda Rockers, MD;  Location: [facility];  Service: Cardiopulmonary;  Laterality: Bilateral;  . Chest tube insertion Left 08/26/2014    Procedure: INSERTION OF LEFT  PLEURAL DRAINAGE CATHETER;  Surgeon: Ivin Poot, MD;  Location: [facility];  Service: Thoracic;  Laterality: Left;  . Cataract extraction Right 03/2015    Dr  [name]  . Portacath placement Right 03/31/2015    Procedure: INSERTION PORT-A-CATH;  Surgeon: Ivin Poot, MD;  Location: [facility];  Service: Thoracic;  Laterality: Right;  . Removal of pleural drainage catheter Left 03/31/2015    Procedure: REMOVAL OF PLEURAL DRAINAGE CATHETER;  Surgeon: Ivin Poot, MD;  Location: [facility];  Service: Thoracic;  Laterality: Left;    REVIEW OF SYSTEMS:  Constitutional: positive for fatigue Eyes: negative Ears, nose, mouth, throat, and face: negative Respiratory: positive for cough and dyspnea on exertion Cardiovascular: positive for lower extremity edema Gastrointestinal: negative Genitourinary:negative Integument/breast: negative Hematologic/lymphatic: negative Musculoskeletal:negative Neurological: negative Behavioral/Psych: negative Endocrine: negative Allergic/Immunologic: negative   PHYSICAL EXAMINATION: General appearance: alert, cooperative, fatigued and no distress Head: Normocephalic,  without obvious abnormality, atraumatic Neck: no adenopathy, no JVD, supple, symmetrical, trachea midline and thyroid not enlarged, symmetric, no tenderness/mass/nodules Lymph nodes: Cervical, supraclavicular, and axillary nodes normal. Resp: diminished breath sounds LLL and dullness to percussion LLL Back: symmetric, no curvature. ROM normal. No CVA tenderness. Cardio: regular rate and rhythm, S1, S2 normal, no murmur, click, rub or gallop GI: soft, non-tender; bowel sounds normal; no masses,  no organomegaly Extremities: edema 1+ to 2+ Neurologic: Alert and oriented X 3, normal strength and tone. Normal symmetric reflexes. Normal coordination and gait  ECOG PERFORMANCE STATUS: 1 - Symptomatic but completely ambulatory  Blood pressure 136/83, pulse 71, temperature 97.7 F (36.5 C), temperature source Oral, resp. rate 19, height '5\' 7"'$  (1.702 m), weight 197 lb 12.8 oz (89.721 kg), SpO2 98 %.  LABORATORY DATA: Lab Results  Component Value Date   WBC 5.8  06/13/2015   HGB 11.2* 06/13/2015   HCT 34.8* 06/13/2015   MCV 92.3 06/13/2015   PLT 207 06/13/2015      Chemistry      Component Value Date/Time   NA 141 06/13/2015 1118   NA 140 04/07/2015 1039   K 3.5 06/13/2015 1118   K 3.7 04/07/2015 1039   CL 101 04/07/2015 1039   CO2 32* 06/13/2015 1118   CO2 35* 04/07/2015 1039   BUN 17.0 06/13/2015 1118   BUN 35* 04/07/2015 1039   CREATININE 0.8 06/13/2015 1118   CREATININE 0.80 04/07/2015 1039   CREATININE 0.83 02/22/2014 1314      Component Value Date/Time   CALCIUM 8.5 06/13/2015 1118   CALCIUM 8.7 04/07/2015 1039   ALKPHOS 64 06/13/2015 1118   ALKPHOS 68 08/13/2014 1222   AST 20 06/13/2015 1118   AST 21 08/13/2014 1222   ALT 14 06/13/2015 1118   ALT 15 08/13/2014 1222   BILITOT 0.48 06/13/2015 1118   BILITOT 1.3* 08/13/2014 1222       RADIOGRAPHIC STUDIES: No results found. ASSESSMENT AND PLAN: this is a very pleasant 74 years old white male with:  1) Stage IV non-small cell lung cancer, adenocarcinoma presenting with large right upper lobe lung mass in addition to mediastinal and bilateral hilar lymphadenopathy as well as cervical lymphadenopathy and left pleural effusion. The patient completed a course systemic chemotherapy with carboplatin and Alimta status post 6 cycles. This was followed by 3 cycles of maintenance chemotherapy with single agent Alimta. The patient tolerated his treatment well. He had evidence for disease progression and the patient was started on treatment again with carboplatin and Alimta status post 6 cycles. His recent CT scan of the chest, abdomen and pelvis showed stable to minimal improvement in the metastatic liver lesions. There is no other evidence for disease progression. He is currently being treated with immunotherapy with Trevor Mace Mapp at 3 mg/kg given every 2 weeks. Status post 2 cycles. Patient was discussed with and also seen by Dr. [name]. He'll proceed with cycle #3 today as scheduled.  He will follow-up in 2 weeks for reassessment prior to this the start of cycle #4.   2) Atrial fibrillation: continue Eliquis.  3) lower extremity edema: Improved. He will continue Lasix on as-needed basis.  He was advised to call immediately if he has any concerning symptoms in the interval.  The patient voices understanding of current disease status and treatment options and is in agreement with the current care plan.  All questions were answered. The patient knows to call the clinic with any problems, questions or concerns. We  can certainly see the patient much sooner if necessary.  Shawn Adam, PA-C 06/13/2015  ADDENDUM: Hematology/Oncology Attending: I had a face to face encounter with the patient. I recommended his care plan. This is a very pleasant 74 years old white male with a stage IV non-small cell lung cancer, adenocarcinoma who is currently undergoing treatment with immunotherapy with Nivolumab status post 2 cycles. He is tolerating his immunotherapy fairly well with no significant adverse effects. I recommended for the patient to proceed with cycle #3 today as scheduled. He would come back for follow-up visit in 2 weeks for reevaluation before starting cycle #4. The patient was advised to call immediately if he has any concerning symptoms in the interval.  Disclaimer: This note was dictated with voice recognition software. Similar sounding words can inadvertently be transcribed and may not be corrected upon review. Eilleen Kempf., MD 06/18/2015

## 2015-06-13 NOTE — Patient Instructions (Signed)
[facility] Discharge Instructions for Patients  Today you received the following: Nivolumab   To help prevent nausea and vomiting after your treatment, we encourage you to take your nausea medication as directed.    If you develop nausea and vomiting that is not controlled by your nausea medication, call the clinic.   BELOW ARE SYMPTOMS THAT SHOULD BE REPORTED IMMEDIATELY:  *FEVER GREATER THAN 100.5 F  *CHILLS WITH OR WITHOUT FEVER  NAUSEA AND VOMITING THAT IS NOT CONTROLLED WITH YOUR NAUSEA MEDICATION  *UNUSUAL SHORTNESS OF BREATH  *UNUSUAL BRUISING OR BLEEDING  TENDERNESS IN MOUTH AND THROAT WITH OR WITHOUT PRESENCE OF ULCERS  *URINARY PROBLEMS  *BOWEL PROBLEMS  UNUSUAL RASH Items with * indicate a potential emergency and should be followed up as soon as possible.  Feel free to call the clinic you have any questions or concerns. The clinic phone number is [phone].  Please show the CHEMO ALERT CARD at check-in to the Emergency Department and triage nurse.   

## 2015-06-13 NOTE — Patient Instructions (Signed)

## 2015-06-13 NOTE — Telephone Encounter (Signed)
Appointments made and patient will get a new avs in chemo,

## 2015-06-14 ENCOUNTER — Encounter: Payer: Self-pay | Admitting: Family Medicine

## 2015-06-14 ENCOUNTER — Ambulatory Visit (INDEPENDENT_AMBULATORY_CARE_PROVIDER_SITE_OTHER): Payer: Medicare Other | Admitting: Family Medicine

## 2015-06-14 VITALS — BP 140/82 | HR 68 | Temp 98.3°F | Wt 198.8 lb

## 2015-06-14 DIAGNOSIS — T380X5A Adverse effect of glucocorticoids and synthetic analogues, initial encounter: Secondary | ICD-10-CM

## 2015-06-14 DIAGNOSIS — M79669 Pain in unspecified lower leg: Secondary | ICD-10-CM

## 2015-06-14 DIAGNOSIS — E099 Drug or chemical induced diabetes mellitus without complications: Secondary | ICD-10-CM

## 2015-06-14 DIAGNOSIS — R21 Rash and other nonspecific skin eruption: Secondary | ICD-10-CM | POA: Diagnosis not present

## 2015-06-14 DIAGNOSIS — R600 Localized edema: Secondary | ICD-10-CM

## 2015-06-14 DIAGNOSIS — M79604 Pain in right leg: Secondary | ICD-10-CM | POA: Insufficient documentation

## 2015-06-14 DIAGNOSIS — M79605 Pain in left leg: Secondary | ICD-10-CM

## 2015-06-14 NOTE — Assessment & Plan Note (Addendum)
Overall slowly improving off alimta. For skin breakdown interdigital toe web spaces - rec lotrimin and patting dry after shower.

## 2015-06-14 NOTE — Assessment & Plan Note (Signed)
Now off steroids anticipate DM will remain well controlled. Lab Results  Component Value Date   HGBA1C 7.3* 01/19/2015

## 2015-06-14 NOTE — Assessment & Plan Note (Signed)
Bilateral lower leg pain - some neuropathic pain component - will trial gabapentin 100-'200mg'$  nightly as it seems to be worse at night. Update with med effect

## 2015-06-14 NOTE — Patient Instructions (Addendum)
Arteries look good. Continue exercise. I think this is combination of neuropathy and venous insufficiency.  Trial thigh high compression stockings - script provided today. Trial gabapentin 100-'200mg'$  nightly Lotrimin and pat dry between toes after showering. Update [place] with effect. Try decreasing torsemide to '20mg'$  once daily - if leg swelling noted, increase back to twice daily.

## 2015-06-14 NOTE — Progress Notes (Signed)
Pre visit review using our clinic review tool, if applicable. No additional management support is needed unless otherwise documented below in the visit note. 

## 2015-06-14 NOTE — Progress Notes (Signed)
BP 140/82 mmHg  Pulse 68  Temp(Src) 98.3 F (36.8 C) (Oral)  Wt 198 lb 12.8 oz (90.175 kg)  SpO2 98%   CC: leg pain/rash  Subjective:    Patient ID: Shawn Espinoza, male    DOB: [birth year], 74 y.o.   MRN: [id number]  HPI: DIESEL LINA is a 74 y.o. male presenting on 06/14/2015 for Leg Pain   Ongoing trouble with bilateral legs. Stinging and burning at top of ankles and into calves. + paresthesias of feet. No foot pain or rash. Red rash comes and goes, not as bad as it has been. No significant change with change in chemotherapy.   Treating with aquaphor and OTC hydrocortisone cream daily.   Leg rash/pain prior attributed to chemo. This was changed from alimta to nivolumab (started 05/24/2015).   Off alimta, off steroids.   Diabetic Foot Exam - Simple   Simple Foot Form  Diabetic Foot exam was performed with the following findings:  Yes 06/14/2015 12:23 PM  Visual Inspection  No deformities, no ulcerations, no other skin breakdown bilaterally:  Yes  Sensation Testing  Intact to touch and monofilament testing bilaterally:  Yes  Pulse Check  Posterior Tibialis and Dorsalis pulse intact bilaterally:  Yes  Comments  Maceration between toes R>L       Relevant past medical, surgical, family and social history reviewed and updated as indicated. Interim medical history since our last visit reviewed. Allergies and medications reviewed and updated. Current Outpatient Prescriptions on File Prior to Visit  Medication Sig  . albuterol (PROVENTIL HFA;VENTOLIN HFA) 108 (90 BASE) MCG/ACT inhaler Inhale 2 puffs into the lungs every 6 (six) hours as needed for wheezing or shortness of breath.  . Alum & Mag Hydroxide-Simeth (MAGIC MOUTHWASH W/LIDOCAINE) SOLN Take 5 mLs by mouth 3 (three) times daily as needed for mouth pain.  Marland Kitchen apixaban (ELIQUIS) 5 MG TABS tablet Take 1 tablet (5 mg total) by mouth 2 (two) times daily.  . cetirizine (ZYRTEC) 10 MG tablet Take 10 mg by mouth daily as  needed for allergies or rhinitis.   Marland Kitchen diphenoxylate-atropine (LOMOTIL) 2.5-0.025 MG per tablet Take 2 tablets by mouth 4 (four) times daily as needed for diarrhea or loose stools.  . folic acid (FOLVITE) 1 MG tablet TAKE 1 TABLET BY MOUTH EVERY DAY  . glimepiride (AMARYL) 1 MG tablet Take 1 mg by mouth daily with breakfast.   . glucose blood test strip Use as instructed to check blood sugar once daily and as needed. 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 HYDROcodone-acetaminophen (NORCO/VICODIN) 5-325 MG per tablet Take 1 tablet by mouth every 6 (six) hours as needed for moderate pain.  Marland Kitchen KLOR-CON M10 10 MEQ tablet   . Lancets MISC Use to check sugar once daily and as needed Dx: V56.4 **ONE TOUCH DELICA**  . lidocaine-prilocaine (EMLA) cream Apply 1 application topically as needed.  . metoprolol tartrate (LOPRESSOR) 25 MG tablet Take 1 tablet (25 mg total) by mouth 2 (two) times daily.  . Multiple Vitamins-Minerals (ICAPS) CAPS Take by mouth 2 (two) times daily.  . polyethylene glycol (MIRALAX / GLYCOLAX) packet Take 17 g by mouth daily.   Marland Kitchen torsemide (DEMADEX) 20 MG tablet Take 1 tablet (20 mg total) by mouth 2 (two) times daily.   No current facility-administered medications on file prior to visit.    Review of Systems Per HPI unless specifically indicated above     Objective:    BP 140/82 mmHg  Pulse 68  Temp(Src) 98.3  F (36.8 C) (Oral)  Wt 198 lb 12.8 oz (90.175 kg)  SpO2 98%  Wt Readings from Last 3 Encounters:  06/14/15 198 lb 12.8 oz (90.175 kg)  06/13/15 197 lb 12.8 oz (89.721 kg)  05/16/15 198 lb 12.8 oz (90.175 kg)    Physical Exam  Constitutional: He appears well-developed and well-nourished. No distress.  Musculoskeletal: Normal range of motion. He exhibits edema.  Hard lymphedema present bilateral lower legs 1+ pitting edema present from knees to hips Maceration between toes R>L  Skin: Skin is warm and dry. No rash noted. There is erythema.  Of lower legs bilaterally  Nursing note and  vitals reviewed.  Results for orders placed or performed in visit on 06/13/15  CBC with Differential  Result Value Ref Range   WBC 5.8 4.0 - 10.3 10e3/uL   NEUT# 4.3 1.5 - 6.5 10e3/uL   HGB 11.2 (L) 13.0 - 17.1 g/dL   HCT 34.8 (L) 38.4 - 49.9 %   Platelets 207 140 - 400 10e3/uL   MCV 92.3 79.3 - 98.0 fL   MCH 29.7 27.2 - 33.4 pg   MCHC 32.2 32.0 - 36.0 g/dL   RBC 3.77 (L) 4.20 - 5.82 10e6/uL   RDW 16.3 (H) 11.0 - 14.6 %   lymph# 0.7 (L) 0.9 - 3.3 10e3/uL   MONO# 0.6 0.1 - 0.9 10e3/uL   Eosinophils Absolute 0.2 0.0 - 0.5 10e3/uL   Basophils Absolute 0.1 0.0 - 0.1 10e3/uL   NEUT% 73.9 39.0 - 75.0 %   LYMPH% 11.3 (L) 14.0 - 49.0 %   MONO% 10.0 0.0 - 14.0 %   EOS% 3.8 0.0 - 7.0 %   BASO% 1.0 0.0 - 2.0 %  Comprehensive metabolic panel  Result Value Ref Range   Sodium 141 136 - 145 mEq/L   Potassium 3.5 3.5 - 5.1 mEq/L   Chloride 102 98 - 109 mEq/L   CO2 32 (H) 22 - 29 mEq/L   Glucose 178 (H) 70 - 140 mg/dl   BUN 17.0 7.0 - 26.0 mg/dL   Creatinine 0.8 0.7 - 1.3 mg/dL   Total Bilirubin 0.48 0.20 - 1.20 mg/dL   Alkaline Phosphatase 64 40 - 150 U/L   AST 20 5 - 34 U/L   ALT 14 0 - 55 U/L   Total Protein 5.7 (L) 6.4 - 8.3 g/dL   Albumin 2.8 (L) 3.5 - 5.0 g/dL   Calcium 8.5 8.4 - 10.4 mg/dL   Anion Gap 8 3 - 11 mEq/L   EGFR 88 (L) >90 ml/min/1.73 m2  TSH-Thyroid Stimulating Hormone  Result Value Ref Range   TSH 4.559 (H) 0.320 - 4.118 m(IU)/L      Assessment & Plan:   Problem List Items Addressed This Visit    Pain in limb - Primary    Bilateral lower leg pain - some neuropathic pain component - will trial gabapentin 100-289m nightly as it seems to be worse at night. Update with med effect      Pedal edema    Trial thigh high compression stockings given persistent upper leg edema.  Decrease torsemide to 273monce daily and monitor swelling - with option to increase back to BID dosing.  Anticipate venous insuff related + developing angioedema.      Skin rash     Overall slowly improving off alimta. For skin breakdown interdigital toe web spaces - rec lotrimin and patting dry after shower.      Steroid-induced diabetes mellitus    Now  off steroids anticipate DM will remain well controlled. Lab Results  Component Value Date   HGBA1C 7.3* 01/19/2015            Follow up plan: Return if symptoms worsen or fail to improve.

## 2015-06-14 NOTE — Assessment & Plan Note (Addendum)
Trial thigh high compression stockings given persistent upper leg edema.  Decrease torsemide to '20mg'$  once daily and monitor swelling - with option to increase back to BID dosing.  Anticipate venous insuff related + developing angioedema.

## 2015-06-15 MED ORDER — GABAPENTIN 100 MG PO CAPS: 100.0000 mg | ORAL_CAPSULE | Freq: Every day | ORAL | Status: DC

## 2015-06-15 NOTE — Addendum Note (Signed)
Addended by: [name] on: 06/15/2015 08:22 AM   Modules accepted: Orders

## 2015-06-16 ENCOUNTER — Ambulatory Visit (INDEPENDENT_AMBULATORY_CARE_PROVIDER_SITE_OTHER): Payer: Medicare Other | Admitting: Podiatry

## 2015-06-16 DIAGNOSIS — E1140 Type 2 diabetes mellitus with diabetic neuropathy, unspecified: Secondary | ICD-10-CM

## 2015-06-16 DIAGNOSIS — M2021 Hallux rigidus, right foot: Secondary | ICD-10-CM

## 2015-06-16 DIAGNOSIS — M2022 Hallux rigidus, left foot: Secondary | ICD-10-CM | POA: Diagnosis not present

## 2015-06-16 DIAGNOSIS — E1149 Type 2 diabetes mellitus with other diabetic neurological complication: Secondary | ICD-10-CM

## 2015-06-16 NOTE — Patient Instructions (Signed)

## 2015-06-16 NOTE — Progress Notes (Signed)
Patient ID: Shawn Espinoza, male   DOB: [birth year], 74 y.o.   MRN: [id number] Patient presents for diabetic shoe pick up, shoes are tried on for good fit.  Patient received 1 pair Apex x821 Lace Walker white in men's 9 extra wide and 3 pairs custom molded diabetic inserts.  Written break in and wear instructions given, patient will follow in future with regularly scheduled care.

## 2015-06-17 NOTE — Patient Instructions (Signed)
Follow up in 2 weeks, prior to the start of your next scheduled cycle of immunotherapy. 

## 2015-06-19 ENCOUNTER — Other Ambulatory Visit: Payer: Self-pay

## 2015-06-20 ENCOUNTER — Ambulatory Visit: Payer: Medicare Other | Admitting: *Deleted

## 2015-06-20 DIAGNOSIS — E1149 Type 2 diabetes mellitus with other diabetic neurological complication: Secondary | ICD-10-CM

## 2015-06-20 NOTE — Progress Notes (Signed)
Patient ID: Shawn Espinoza, male   DOB: 02/29/1941, 74 y.o.   MRN: [id number] Patient returns with his diabetic shoes they continue to be too tight shoes state that they are an extra wide but fit like a medium documentation will be sent with shoes to manufacturer.  Patient has chosen another shoe and style we will order this and call him as soon as they arrive.

## 2015-06-21 ENCOUNTER — Ambulatory Visit: Payer: Medicare Other

## 2015-06-21 ENCOUNTER — Other Ambulatory Visit: Payer: Medicare Other

## 2015-06-27 ENCOUNTER — Ambulatory Visit (HOSPITAL_BASED_OUTPATIENT_CLINIC_OR_DEPARTMENT_OTHER): Payer: Medicare Other

## 2015-06-27 ENCOUNTER — Telehealth: Payer: Self-pay | Admitting: Internal Medicine

## 2015-06-27 ENCOUNTER — Encounter: Payer: Self-pay | Admitting: Internal Medicine

## 2015-06-27 ENCOUNTER — Ambulatory Visit: Payer: Medicare Other

## 2015-06-27 ENCOUNTER — Ambulatory Visit (HOSPITAL_BASED_OUTPATIENT_CLINIC_OR_DEPARTMENT_OTHER): Payer: Medicare Other | Admitting: Internal Medicine

## 2015-06-27 ENCOUNTER — Other Ambulatory Visit (HOSPITAL_BASED_OUTPATIENT_CLINIC_OR_DEPARTMENT_OTHER): Payer: Medicare Other

## 2015-06-27 ENCOUNTER — Other Ambulatory Visit: Payer: Medicare Other

## 2015-06-27 VITALS — BP 151/77 | HR 102 | Temp 97.9°F | Resp 20 | Ht 67.0 in | Wt 195.9 lb

## 2015-06-27 DIAGNOSIS — Z5112 Encounter for antineoplastic immunotherapy: Secondary | ICD-10-CM | POA: Diagnosis present

## 2015-06-27 DIAGNOSIS — Z95828 Presence of other vascular implants and grafts: Secondary | ICD-10-CM

## 2015-06-27 DIAGNOSIS — C3492 Malignant neoplasm of unspecified part of left bronchus or lung: Secondary | ICD-10-CM

## 2015-06-27 DIAGNOSIS — C787 Secondary malignant neoplasm of liver and intrahepatic bile duct: Secondary | ICD-10-CM

## 2015-06-27 DIAGNOSIS — C3412 Malignant neoplasm of upper lobe, left bronchus or lung: Secondary | ICD-10-CM

## 2015-06-27 LAB — CBC WITH DIFFERENTIAL/PLATELET
BASO%: 1.3 % (ref 0.0–2.0)
Basophils Absolute: 0.1 10*3/uL (ref 0.0–0.1)
EOS%: 3.5 % (ref 0.0–7.0)
Eosinophils Absolute: 0.2 10*3/uL (ref 0.0–0.5)
HEMATOCRIT: 35.7 % — AB (ref 38.4–49.9)
HGB: 11.4 g/dL — ABNORMAL LOW (ref 13.0–17.1)
LYMPH#: 0.6 10*3/uL — AB (ref 0.9–3.3)
LYMPH%: 13 % — ABNORMAL LOW (ref 14.0–49.0)
MCH: 28.5 pg (ref 27.2–33.4)
MCHC: 32.1 g/dL (ref 32.0–36.0)
MCV: 88.7 fL (ref 79.3–98.0)
MONO#: 0.4 10*3/uL (ref 0.1–0.9)
MONO%: 9 % (ref 0.0–14.0)
NEUT%: 73.2 % (ref 39.0–75.0)
NEUTROS ABS: 3.5 10*3/uL (ref 1.5–6.5)
PLATELETS: 207 10*3/uL (ref 140–400)
RBC: 4.02 10*6/uL — AB (ref 4.20–5.82)
RDW: 16.1 % — ABNORMAL HIGH (ref 11.0–14.6)
WBC: 4.8 10*3/uL (ref 4.0–10.3)

## 2015-06-27 LAB — COMPREHENSIVE METABOLIC PANEL (CC13)
ALT: 17 U/L (ref 0–55)
ANION GAP: 10 meq/L (ref 3–11)
AST: 25 U/L (ref 5–34)
Albumin: 2.9 g/dL — ABNORMAL LOW (ref 3.5–5.0)
Alkaline Phosphatase: 63 U/L (ref 40–150)
BUN: 16.6 mg/dL (ref 7.0–26.0)
CHLORIDE: 101 meq/L (ref 98–109)
CO2: 29 mEq/L (ref 22–29)
Calcium: 8.9 mg/dL (ref 8.4–10.4)
Creatinine: 0.8 mg/dL (ref 0.7–1.3)
EGFR: 87 mL/min/{1.73_m2} — ABNORMAL LOW (ref >90)
Glucose: 223 mg/dl — ABNORMAL HIGH (ref 70–140)
Potassium: 3.9 mEq/L (ref 3.5–5.1)
Sodium: 140 mEq/L (ref 136–145)
Total Bilirubin: 0.47 mg/dL (ref 0.20–1.20)
Total Protein: 5.9 g/dL — ABNORMAL LOW (ref 6.4–8.3)

## 2015-06-27 LAB — HM DIABETES EYE EXAM

## 2015-06-27 MED ORDER — SODIUM CHLORIDE 0.9 % IJ SOLN
10.0000 mL | INTRAMUSCULAR | Status: DC | PRN
  Administered 2015-06-27: 10 mL
  Filled 2015-06-27: qty 10

## 2015-06-27 MED ORDER — HEPARIN SOD (PORK) LOCK FLUSH 100 UNIT/ML IV SOLN
500.0000 [IU] | Freq: Once | INTRAVENOUS | Status: AC | PRN
  Administered 2015-06-27: 500 [IU]
  Filled 2015-06-27: qty 5

## 2015-06-27 MED ORDER — SODIUM CHLORIDE 0.9 % IJ SOLN
10.0000 mL | INTRAMUSCULAR | Status: DC | PRN
  Administered 2015-06-27: 10 mL via INTRAVENOUS
  Filled 2015-06-27: qty 10

## 2015-06-27 MED ORDER — SODIUM CHLORIDE 0.9 % IV SOLN
Freq: Once | INTRAVENOUS | Status: AC
  Administered 2015-06-27: 12:00:00 via INTRAVENOUS

## 2015-06-27 MED ORDER — SODIUM CHLORIDE 0.9 % IV SOLN
3.0000 mg/kg | Freq: Once | INTRAVENOUS | Status: AC
  Administered 2015-06-27: 270 mg via INTRAVENOUS
  Filled 2015-06-27: qty 27

## 2015-06-27 NOTE — Patient Instructions (Signed)

## 2015-06-27 NOTE — Telephone Encounter (Signed)
Gave and printed appt sched and avs fo rpt for July./////gave barium

## 2015-06-27 NOTE — Patient Instructions (Signed)
[facility] Discharge Instructions for Patients  Today you received the following: Nivolumab   To help prevent nausea and vomiting after your treatment, we encourage you to take your nausea medication as directed.    If you develop nausea and vomiting that is not controlled by your nausea medication, call the clinic.   BELOW ARE SYMPTOMS THAT SHOULD BE REPORTED IMMEDIATELY:  *FEVER GREATER THAN 100.5 F  *CHILLS WITH OR WITHOUT FEVER  NAUSEA AND VOMITING THAT IS NOT CONTROLLED WITH YOUR NAUSEA MEDICATION  *UNUSUAL SHORTNESS OF BREATH  *UNUSUAL BRUISING OR BLEEDING  TENDERNESS IN MOUTH AND THROAT WITH OR WITHOUT PRESENCE OF ULCERS  *URINARY PROBLEMS  *BOWEL PROBLEMS  UNUSUAL RASH Items with * indicate a potential emergency and should be followed up as soon as possible.  Feel free to call the clinic you have any questions or concerns. The clinic phone number is [phone].  Please show the CHEMO ALERT CARD at check-in to the Emergency Department and triage nurse.   

## 2015-06-27 NOTE — Progress Notes (Signed)
[place] [phone]   [phone]) [place], [place] [address]  DIAGNOSIS: Stage IIIB/IV non-small cell lung cancer, adenocarcinoma diagnosed in March of 2015.  PRIOR THERAPY:  1) Systemic chemotherapy with carboplatin for AUC of 5 and Alimta 500 mg/M2 every 3 weeks. First dose expected on 04/13/2014. Status post 6 cycles. 2)  Maintenance chemotherapy with single agent Alimta 500 mg/M2 every 3 weeks. First cycle on 09/06/2014. Status post 3 cycles. 3) Second course of systemic chemotherapy with carboplatin for AUC of 5 and Alimta 500 MG/M2 every 3 weeks. First cycle 11/08/2014. Status post 6 cycles.  CURRENT THERAPY: Immunotherapy with Nivolumab 3 MG/KG every 2 weeks. First dose 05/24/2015. Status post 3 cycles.  INTERVAL HISTORY: Shawn Espinoza 74 y.o. male returns to the clinic today for follow up visit. He is tolerating his treatment with Nivolumab well with no significant adverse effects. He denied having any significant chest pain but continues to have shortness of breath with exertion and mild cough with no hemoptysis. He has no significant fever or chills, no nausea or vomiting. He is here today to start cycle #4 of his immunotherapy with Nivolumab.  MEDICAL HISTORY: Past Medical History  Diagnosis Date  . History of diabetes mellitus, type II     resolved with diet, h/o neuropathy  . Diverticulosis of colon   . Hyperlipidemia   . Hypertension   . Fatty liver 02/29/00    abd ultrasound  . Lower back pain   . Systolic murmur 4132    2Decho - normal LV fxn, EF 55%, mild AS, biatrial enlargement  . History of tobacco use quit 1990s  . 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 Personal history of colonic adenomas 07/06/2013  . Hypertensive retinopathy of both  eyes 2015    mild  . 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 Dysrhythmia     Atrial fib (found 07/2014)  . Shortness of breath   . Pneumonia   . GERD (gastroesophageal reflux disease)     occasional  . Arthritis   . Neuropathy   . Constipation   . 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 Diabetes mellitus without complication     Type 2  . Depression     ALLERGIES:  is allergic to candesartan cilexetil; diltiazem hcl; doxazosin mesylate; nifedipine; pravastatin; red yeast rice; rosuvastatin; sertraline; and simvastatin.  MEDICATIONS:  Current Outpatient Prescriptions  Medication Sig Dispense Refill  . albuterol (PROVENTIL HFA;VENTOLIN HFA) 108 (90 BASE) MCG/ACT inhaler Inhale 2 puffs into the lungs every 6 (six) hours as needed for wheezing or shortness of breath. 1 Inhaler 0  . Alum & Mag Hydroxide-Simeth (MAGIC MOUTHWASH W/LIDOCAINE) SOLN Take 5 mLs by mouth 3 (three) times daily as needed for mouth pain. 5 mL 0  . apixaban (ELIQUIS) 5 MG TABS tablet Take 1 tablet (5 mg total) by mouth 2 (two) times daily. 60 tablet 6  . cetirizine (ZYRTEC) 10 MG tablet Take 10 mg by mouth daily as needed for allergies or rhinitis.     Marland Kitchen dexamethasone (DECADRON) 4 MG tablet   0  . diphenoxylate-atropine (LOMOTIL) 2.5-0.025 MG per tablet Take 2 tablets by mouth  4 (four) times daily as needed for diarrhea or loose stools. 30 tablet 0  . folic acid (FOLVITE) 1 MG tablet TAKE 1 TABLET BY MOUTH EVERY DAY 30 tablet 4  . gabapentin (NEURONTIN) 100 MG capsule Take 1 capsule (100 mg total) by mouth at bedtime. May increase to '200mg'$  nightly after 4 days if needed 60 capsule 3  . glimepiride (AMARYL) 1 MG tablet Take 1 mg by mouth daily with breakfast.   11  . glucose blood test strip Use as instructed to check blood sugar once daily and as needed. Dx: E09.9 200 each 3  . HYDROcodone-acetaminophen (NORCO/VICODIN) 5-325 MG per tablet Take 1 tablet  by mouth every 6 (six) hours as needed for moderate pain.    Marland Kitchen ketorolac (ACULAR) 0.5 % ophthalmic solution PLACE 1 DROP IN LEFT EYE 4 TIMES DAILY STARTING 1 WEEK PRIOR TO SURGERY  4  . KLOR-CON M10 10 MEQ tablet   10  . Lancets MISC Use to check sugar once daily and as needed Dx: G25.4 **ONE TOUCH DELICA** 270 each 3  . lidocaine-prilocaine (EMLA) cream Apply 1 application topically as needed. 30 g 1  . metoprolol tartrate (LOPRESSOR) 25 MG tablet Take 1 tablet (25 mg total) by mouth 2 (two) times daily. 60 tablet 6  . Multiple Vitamins-Minerals (ICAPS) CAPS Take by mouth 2 (two) times daily.    Marland Kitchen ofloxacin (OCUFLOX) 0.3 % ophthalmic solution   0  . polyethylene glycol (MIRALAX / GLYCOLAX) packet Take 17 g by mouth daily.     Marland Kitchen torsemide (DEMADEX) 20 MG tablet Take 1 tablet (20 mg total) by mouth 2 (two) times daily. 60 tablet 11   No current facility-administered medications for this visit.   Facility-Administered Medications Ordered in Other Visits  Medication Dose Route Frequency Provider Last Rate Last Dose  . sodium chloride 0.9 % injection 10 mL  10 mL Intravenous PRN Curt Bears, MD   10 mL at 06/27/15 1039    SURGICAL HISTORY:  Past Surgical History  Procedure Laterality Date  . Colonoscopy  2004  . Colonoscopy  2014    tubular adenoma x1, mod diverticulosis ([place])  . Flexible bronchoscopy  02/2014    WNL  . Video bronchoscopy Bilateral 03/17/2014    Procedure: VIDEO BRONCHOSCOPY WITH FLUORO;  Surgeon: Tanda Rockers, MD;  Location: [facility];  Service: Cardiopulmonary;  Laterality: Bilateral;  . Chest tube insertion Left 08/26/2014    Procedure: INSERTION OF LEFT  PLEURAL DRAINAGE CATHETER;  Surgeon: Ivin Poot, MD;  Location: [facility];  Service: Thoracic;  Laterality: Left;  . Cataract extraction Right 03/2015    Dr [name]  . Portacath placement Right 03/31/2015    Procedure: INSERTION PORT-A-CATH;  Surgeon: Ivin Poot, MD;  Location: [facility];  Service: Thoracic;   Laterality: Right;  . Removal of pleural drainage catheter Left 03/31/2015    Procedure: REMOVAL OF PLEURAL DRAINAGE CATHETER;  Surgeon: Ivin Poot, MD;  Location: [facility];  Service: Thoracic;  Laterality: Left;    REVIEW OF SYSTEMS:  Constitutional: positive for fatigue Eyes: negative Ears, nose, mouth, throat, and face: negative Respiratory: positive for cough and dyspnea on exertion Cardiovascular: negative Gastrointestinal: negative Genitourinary:negative Integument/breast: negative Hematologic/lymphatic: negative Musculoskeletal:negative Neurological: negative Behavioral/Psych: negative Endocrine: negative Allergic/Immunologic: negative   PHYSICAL EXAMINATION: General appearance: alert, cooperative, fatigued and no distress Head: Normocephalic, without obvious abnormality, atraumatic Neck: no adenopathy, no JVD, supple, symmetrical, trachea midline and thyroid not enlarged, symmetric, no tenderness/mass/nodules Lymph nodes: Cervical,  supraclavicular, and axillary nodes normal. Resp: diminished breath sounds LLL and dullness to percussion LLL Back: symmetric, no curvature. ROM normal. No CVA tenderness. Cardio: regular rate and rhythm, S1, S2 normal, no murmur, click, rub or gallop GI: soft, non-tender; bowel sounds normal; no masses,  no organomegaly Extremities: edema 2+ Neurologic: Alert and oriented X 3, normal strength and tone. Normal symmetric reflexes. Normal coordination and gait  ECOG PERFORMANCE STATUS: 1 - Symptomatic but completely ambulatory  Blood pressure 151/77, pulse 102, temperature 97.9 F (36.6 C), temperature source Oral, resp. rate 20, height '5\' 7"'$  (1.702 m), weight 195 lb 14.4 oz (88.86 kg), SpO2 100 %.  LABORATORY DATA: Lab Results  Component Value Date   WBC 4.8 06/27/2015   HGB 11.4* 06/27/2015   HCT 35.7* 06/27/2015   MCV 88.7 06/27/2015   PLT 207 06/27/2015      Chemistry      Component Value Date/Time   NA 140 06/27/2015 1026   NA  140 04/07/2015 1039   K 3.9 06/27/2015 1026   K 3.7 04/07/2015 1039   CL 101 04/07/2015 1039   CO2 29 06/27/2015 1026   CO2 35* 04/07/2015 1039   BUN 16.6 06/27/2015 1026   BUN 35* 04/07/2015 1039   CREATININE 0.8 06/27/2015 1026   CREATININE 0.80 04/07/2015 1039   CREATININE 0.83 02/22/2014 1314      Component Value Date/Time   CALCIUM 8.9 06/27/2015 1026   CALCIUM 8.7 04/07/2015 1039   ALKPHOS 63 06/27/2015 1026   ALKPHOS 68 08/13/2014 1222   AST 25 06/27/2015 1026   AST 21 08/13/2014 1222   ALT 17 06/27/2015 1026   ALT 15 08/13/2014 1222   BILITOT 0.47 06/27/2015 1026   BILITOT 1.3* 08/13/2014 1222       RADIOGRAPHIC STUDIES: No results found. ASSESSMENT AND PLAN: this is a very pleasant 74 years old white male with:  1) Stage IV non-small cell lung cancer, adenocarcinoma presenting with large right upper lobe lung mass in addition to mediastinal and bilateral hilar lymphadenopathy as well as cervical lymphadenopathy and left pleural effusion. The patient completed a course systemic chemotherapy with carboplatin and Alimta status post 6 cycles. This was followed by 3 cycles of maintenance chemotherapy with single agent Alimta. The patient tolerated his treatment well. He had evidence for disease progression and the patient was started on treatment again with carboplatin and Alimta status post 6 cycles. He is currently on treatment with immunotherapy with Nivolumab status post 3 cycles. The patient is tolerating his treatment well with no significant adverse effects. We will proceed with cycle #4 today as a scheduled. The patient would come back for follow-up visit in 2 weeks for reevaluation with repeat CT scan of the chest, abdomen and pelvis for restaging of his disease.  2) Atrial fibrillation: continue Aliquis.  3) lower extremity edema: Improved. He will continue Lasix on as-needed basis.  He was advised to call immediately if he has any concerning symptoms in the  interval.  The patient voices understanding of current disease status and treatment options and is in agreement with the current care plan.  All questions were answered. The patient knows to call the clinic with any problems, questions or concerns. We can certainly see the patient much sooner if necessary.  Disclaimer: This note was dictated with voice recognition software. Similar sounding words can inadvertently be transcribed and may not be corrected upon review.

## 2015-07-05 ENCOUNTER — Encounter: Payer: Self-pay | Admitting: Family Medicine

## 2015-07-05 ENCOUNTER — Other Ambulatory Visit: Payer: Self-pay | Admitting: Family Medicine

## 2015-07-05 ENCOUNTER — Ambulatory Visit (INDEPENDENT_AMBULATORY_CARE_PROVIDER_SITE_OTHER): Payer: Medicare Other | Admitting: Family Medicine

## 2015-07-05 VITALS — BP 118/80 | HR 77 | Temp 98.1°F | Wt 194.1 lb

## 2015-07-05 DIAGNOSIS — E099 Drug or chemical induced diabetes mellitus without complications: Secondary | ICD-10-CM | POA: Diagnosis not present

## 2015-07-05 DIAGNOSIS — C3492 Malignant neoplasm of unspecified part of left bronchus or lung: Secondary | ICD-10-CM

## 2015-07-05 DIAGNOSIS — R609 Edema, unspecified: Secondary | ICD-10-CM

## 2015-07-05 DIAGNOSIS — G5793 Unspecified mononeuropathy of bilateral lower limbs: Secondary | ICD-10-CM

## 2015-07-05 DIAGNOSIS — E039 Hypothyroidism, unspecified: Secondary | ICD-10-CM

## 2015-07-05 DIAGNOSIS — T380X5A Adverse effect of glucocorticoids and synthetic analogues, initial encounter: Secondary | ICD-10-CM | POA: Diagnosis not present

## 2015-07-05 DIAGNOSIS — E8809 Other disorders of plasma-protein metabolism, not elsewhere classified: Secondary | ICD-10-CM | POA: Diagnosis not present

## 2015-07-05 DIAGNOSIS — G5792 Unspecified mononeuropathy of left lower limb: Secondary | ICD-10-CM

## 2015-07-05 DIAGNOSIS — G5791 Unspecified mononeuropathy of right lower limb: Secondary | ICD-10-CM

## 2015-07-05 LAB — TSH: TSH: 3.73 u[IU]/mL (ref 0.35–4.50)

## 2015-07-05 LAB — T4, FREE: Free T4: 1.01 ng/dL (ref 0.60–1.60)

## 2015-07-05 LAB — HEMOGLOBIN A1C: Hgb A1c MFr Bld: 5.4 % (ref 4.6–6.5)

## 2015-07-05 MED ORDER — GABAPENTIN 300 MG PO CAPS: 300.0000 mg | ORAL_CAPSULE | Freq: Every day | ORAL | Status: DC

## 2015-07-05 NOTE — Assessment & Plan Note (Addendum)
Continue f/u with pulm. Appreciate their care. Off chemo and steroids.

## 2015-07-05 NOTE — Assessment & Plan Note (Signed)
Anticipate neuropathic pain ?chemo related. Gabapentin helpful - will increase to '300mg'$  nightly. Pt agrees.

## 2015-07-05 NOTE — Progress Notes (Signed)
Pre visit review using our clinic review tool, if applicable. No additional management support is needed unless otherwise documented below in the visit note. 

## 2015-07-05 NOTE — Assessment & Plan Note (Addendum)
Recheck TSH and free T4 today. ?subclinical hypothyroidism.

## 2015-07-05 NOTE — Assessment & Plan Note (Signed)
Check A1c today. Then decide on continuation of amaryl. Pt agrees with plan.

## 2015-07-05 NOTE — Assessment & Plan Note (Signed)
Persistent trouble with this despite good appetite. Continue to encourage good nutrition.

## 2015-07-05 NOTE — Patient Instructions (Addendum)
Leg pain sounds like nerve pain. Let's increase gabapentin to '300mg'$  nightly. New prescription will be '300mg'$  dose - to take one pill nightly. labwork today (A1c) and that will determine about amaryl (gliempiride) Return as needed or in 4 months for follow up.

## 2015-07-05 NOTE — Assessment & Plan Note (Signed)
Leg swelling improving some. Pt back on torsemide '20mg'$  BID.  Did not use thigh high compression - advised ok to watch for now.

## 2015-07-05 NOTE — Progress Notes (Signed)
BP 118/80 mmHg  Pulse 77  Temp(Src) 98.1 F (36.7 C) (Oral)  Wt 194 lb 1.9 oz (88.052 kg)   CC: f/u visit  Subjective:    Patient ID: Shawn Espinoza, male    DOB: 05-12-1941, 74 y.o.   MRN: [id number]  HPI: Shawn Espinoza is a 74 y.o. male presenting on 07/05/2015 for Follow-up   See prior notes for details. For presistent leg edema, we trialed thigh high conpresion stockings last visit, decreased toresmide to '20mg'$  once daily - pt increased back to BID. We also started gabapentin 100-'200mg'$  nightly prn. Persistent trouble at night time with leg discomfort described as waves of paresthesias.   Lung cancer - on optivo. Off chemo and off steroid.  Pending CT scan on Friday. Stays fatigued and weak. This is progressively worsening. Predominant weakness in legs at knees. Goes to [facility] 3x/wk regularly. Notices legs get weak with going up legs.   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  Compliant with antihyperglycemic regimen which includes: amaryl '1mg'$  daily.  Denies low sugars or hypoglycemic symptoms.  + leg paresthesias. Last diabetic eye exam last week.  Pneumovax: 2007.  Prevnar: onc rec hold off on this for now.  Lab Results  Component Value Date   HGBA1C 7.3* 01/19/2015    Relevant past medical, surgical, family and social history reviewed and updated as indicated. Interim medical history since our last visit reviewed. Allergies and medications reviewed and updated. Current Outpatient Prescriptions on File Prior to Visit  Medication Sig  . albuterol (PROVENTIL HFA;VENTOLIN HFA) 108 (90 BASE) MCG/ACT inhaler Inhale 2 puffs into the lungs every 6 (six) hours as needed for wheezing or shortness of breath.  . Alum & Mag Hydroxide-Simeth (MAGIC MOUTHWASH W/LIDOCAINE) SOLN Take 5 mLs by mouth 3 (three) times daily as needed for mouth pain.  Marland Kitchen apixaban (ELIQUIS) 5 MG TABS tablet Take 1 tablet (5 mg total) by mouth 2 (two) times daily.  . cetirizine (ZYRTEC) 10 MG  tablet Take 10 mg by mouth daily as needed for allergies or rhinitis.   Marland Kitchen diphenoxylate-atropine (LOMOTIL) 2.5-0.025 MG per tablet Take 2 tablets by mouth 4 (four) times daily as needed for diarrhea or loose stools.  . folic acid (FOLVITE) 1 MG tablet TAKE 1 TABLET BY MOUTH EVERY DAY  . glimepiride (AMARYL) 1 MG tablet Take 1 mg by mouth daily with breakfast.   . glucose blood test strip Use as instructed to check blood sugar once daily and as needed. 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 HYDROcodone-acetaminophen (NORCO/VICODIN) 5-325 MG per tablet Take 1 tablet by mouth every 6 (six) hours as needed for moderate pain.  Marland Kitchen ketorolac (ACULAR) 0.5 % ophthalmic solution PLACE 1 DROP IN LEFT EYE 4 TIMES DAILY STARTING 1 WEEK PRIOR TO SURGERY  . KLOR-CON M10 10 MEQ tablet   . Lancets MISC Use to check sugar once daily and as needed Dx: Y19.5 **ONE TOUCH DELICA**  . lidocaine-prilocaine (EMLA) cream Apply 1 application topically as needed.  . metoprolol tartrate (LOPRESSOR) 25 MG tablet Take 1 tablet (25 mg total) by mouth 2 (two) times daily.  . Multiple Vitamins-Minerals (ICAPS) CAPS Take by mouth 2 (two) times daily.  Marland Kitchen ofloxacin (OCUFLOX) 0.3 % ophthalmic solution   . polyethylene glycol (MIRALAX / GLYCOLAX) packet Take 17 g by mouth daily.   Marland Kitchen torsemide (DEMADEX) 20 MG tablet Take 1 tablet (20 mg total) by mouth 2 (two) times daily.   No current facility-administered medications on file prior  to visit.    Review of Systems Per HPI unless specifically indicated above     Objective:    BP 118/80 mmHg  Pulse 77  Temp(Src) 98.1 F (36.7 C) (Oral)  Wt 194 lb 1.9 oz (88.052 kg)  Wt Readings from Last 3 Encounters:  07/05/15 194 lb 1.9 oz (88.052 kg)  06/27/15 195 lb 14.4 oz (88.86 kg)  06/14/15 198 lb 12.8 oz (90.175 kg)    Physical Exam  Constitutional: He appears well-developed and well-nourished. No distress.  Cardiovascular: Normal rate, regular rhythm, normal heart sounds and intact distal pulses.   No  murmur heard. Pulmonary/Chest: Effort normal and breath sounds normal. No respiratory distress. He has no wheezes. He has no rales.  Musculoskeletal: He exhibits edema.  Chronic hardening of lower extremities with mild nonpitting edema.  No calf pain or tenderness to palpation FROM at knees without pain  Neurological: He has normal strength.  5/5 strength BLE  Nursing note and vitals reviewed.  Results for orders placed or performed in visit on 07/05/15  HM DIABETES EYE EXAM  Result Value Ref Range   HM Diabetic Eye Exam No Retinopathy No Retinopathy      Assessment & Plan:   Problem List Items Addressed This Visit    Adenocarcinoma of left lung, stage 4    Continue f/u with pulm. Appreciate their care. Off chemo and steroids.      Relevant Medications   gabapentin (NEURONTIN) 300 MG capsule   Hypoalbuminemia    Persistent trouble with this despite good appetite. Continue to encourage good nutrition.      Hypothyroid    Recheck TSH and free T4 today. ?subclinical hypothyroidism.      Relevant Orders   T4, free   TSH   Neuropathic pain, leg, bilateral    Anticipate neuropathic pain ?chemo related. Gabapentin helpful - will increase to '300mg'$  nightly. Pt agrees.      Peripheral edema    Leg swelling improving some. Pt back on torsemide '20mg'$  BID.  Did not use thigh high compression - advised ok to watch for now.      Steroid-induced diabetes mellitus - Primary    Check A1c today. Then decide on continuation of amaryl. Pt agrees with plan.      Relevant Orders   Hemoglobin A1c       Follow up plan: Return in about 4 months (around 11/05/2015), or as needed, for follow up visit.

## 2015-07-07 ENCOUNTER — Ambulatory Visit (HOSPITAL_COMMUNITY)
Admission: RE | Admit: 2015-07-07 | Discharge: 2015-07-07 | Disposition: A | Discharge status: Home / Self Care | Payer: Medicare Other | Source: Ambulatory Visit | Attending: Internal Medicine | Admitting: Internal Medicine

## 2015-07-07 ENCOUNTER — Ambulatory Visit: Payer: Medicare Other

## 2015-07-07 ENCOUNTER — Other Ambulatory Visit (HOSPITAL_BASED_OUTPATIENT_CLINIC_OR_DEPARTMENT_OTHER): Payer: Medicare Other

## 2015-07-07 ENCOUNTER — Encounter (HOSPITAL_COMMUNITY): Payer: Self-pay

## 2015-07-07 VITALS — BP 120/77 | HR 72 | Temp 98.0°F | Resp 16

## 2015-07-07 DIAGNOSIS — Z79899 Other long term (current) drug therapy: Secondary | ICD-10-CM | POA: Diagnosis not present

## 2015-07-07 DIAGNOSIS — R590 Localized enlarged lymph nodes: Secondary | ICD-10-CM | POA: Diagnosis not present

## 2015-07-07 DIAGNOSIS — C3412 Malignant neoplasm of upper lobe, left bronchus or lung: Secondary | ICD-10-CM | POA: Diagnosis present

## 2015-07-07 DIAGNOSIS — I700 Atherosclerosis of aorta: Secondary | ICD-10-CM | POA: Diagnosis not present

## 2015-07-07 DIAGNOSIS — K769 Liver disease, unspecified: Secondary | ICD-10-CM | POA: Insufficient documentation

## 2015-07-07 DIAGNOSIS — C3492 Malignant neoplasm of unspecified part of left bronchus or lung: Secondary | ICD-10-CM | POA: Insufficient documentation

## 2015-07-07 DIAGNOSIS — N400 Enlarged prostate without lower urinary tract symptoms: Secondary | ICD-10-CM | POA: Diagnosis not present

## 2015-07-07 DIAGNOSIS — J9819 Other pulmonary collapse: Secondary | ICD-10-CM | POA: Diagnosis not present

## 2015-07-07 DIAGNOSIS — R918 Other nonspecific abnormal finding of lung field: Secondary | ICD-10-CM | POA: Diagnosis not present

## 2015-07-07 DIAGNOSIS — R530 Neoplastic (malignant) related fatigue: Secondary | ICD-10-CM

## 2015-07-07 DIAGNOSIS — Z95828 Presence of other vascular implants and grafts: Secondary | ICD-10-CM

## 2015-07-07 DIAGNOSIS — C787 Secondary malignant neoplasm of liver and intrahepatic bile duct: Secondary | ICD-10-CM

## 2015-07-07 LAB — CBC WITH DIFFERENTIAL/PLATELET
BASO%: 0.8 % (ref 0.0–2.0)
BASOS ABS: 0 10*3/uL (ref 0.0–0.1)
EOS ABS: 0.1 10*3/uL (ref 0.0–0.5)
EOS%: 2.9 % (ref 0.0–7.0)
HEMATOCRIT: 36.5 % — AB (ref 38.4–49.9)
HGB: 11.7 g/dL — ABNORMAL LOW (ref 13.0–17.1)
LYMPH%: 15.8 % (ref 14.0–49.0)
MCH: 27.9 pg (ref 27.2–33.4)
MCHC: 32 g/dL (ref 32.0–36.0)
MCV: 87.1 fL (ref 79.3–98.0)
MONO#: 0.5 10*3/uL (ref 0.1–0.9)
MONO%: 10.8 % (ref 0.0–14.0)
NEUT#: 3.3 10*3/uL (ref 1.5–6.5)
NEUT%: 69.7 % (ref 39.0–75.0)
PLATELETS: 174 10*3/uL (ref 140–400)
RBC: 4.19 10*6/uL — ABNORMAL LOW (ref 4.20–5.82)
RDW: 16.3 % — ABNORMAL HIGH (ref 11.0–14.6)
WBC: 4.7 10*3/uL (ref 4.0–10.3)
lymph#: 0.7 10*3/uL — ABNORMAL LOW (ref 0.9–3.3)

## 2015-07-07 LAB — COMPREHENSIVE METABOLIC PANEL (CC13)
ALT: 13 U/L (ref 0–55)
ANION GAP: 8 meq/L (ref 3–11)
AST: 20 U/L (ref 5–34)
Albumin: 2.9 g/dL — ABNORMAL LOW (ref 3.5–5.0)
Alkaline Phosphatase: 67 U/L (ref 40–150)
BILIRUBIN TOTAL: 0.47 mg/dL (ref 0.20–1.20)
BUN: 16.3 mg/dL (ref 7.0–26.0)
CO2: 32 mEq/L — ABNORMAL HIGH (ref 22–29)
Calcium: 8.8 mg/dL (ref 8.4–10.4)
Chloride: 100 mEq/L (ref 98–109)
Creatinine: 0.8 mg/dL (ref 0.7–1.3)
EGFR: 87 mL/min/{1.73_m2} — ABNORMAL LOW (ref >90)
GLUCOSE: 181 mg/dL — AB (ref 70–140)
Potassium: 3.7 mEq/L (ref 3.5–5.1)
Sodium: 139 mEq/L (ref 136–145)
Total Protein: 5.8 g/dL — ABNORMAL LOW (ref 6.4–8.3)

## 2015-07-07 LAB — TSH CHCC: TSH: 3.118 m(IU)/L (ref 0.320–4.118)

## 2015-07-07 LAB — MAGNESIUM (CC13): Magnesium: 2 mg/dl (ref 1.5–2.5)

## 2015-07-07 MED ORDER — SODIUM CHLORIDE 0.9 % IJ SOLN
10.0000 mL | INTRAMUSCULAR | Status: DC | PRN
  Administered 2015-07-07: 10 mL via INTRAVENOUS
  Filled 2015-07-07: qty 10

## 2015-07-07 MED ORDER — IOHEXOL 300 MG/ML  SOLN
100.0000 mL | Freq: Once | INTRAMUSCULAR | Status: AC | PRN
  Administered 2015-07-07: 100 mL via INTRAVENOUS

## 2015-07-07 NOTE — Patient Instructions (Signed)

## 2015-07-11 ENCOUNTER — Ambulatory Visit: Payer: Medicare Other

## 2015-07-11 ENCOUNTER — Other Ambulatory Visit: Payer: Medicare Other

## 2015-07-11 ENCOUNTER — Telehealth: Payer: Self-pay | Admitting: Internal Medicine

## 2015-07-11 ENCOUNTER — Other Ambulatory Visit: Payer: Self-pay | Admitting: Medical Oncology

## 2015-07-11 ENCOUNTER — Ambulatory Visit (HOSPITAL_BASED_OUTPATIENT_CLINIC_OR_DEPARTMENT_OTHER): Payer: Medicare Other | Admitting: Nurse Practitioner

## 2015-07-11 VITALS — BP 144/71 | HR 89 | Temp 98.2°F | Resp 18 | Ht 67.0 in | Wt 193.9 lb

## 2015-07-11 DIAGNOSIS — C3492 Malignant neoplasm of unspecified part of left bronchus or lung: Secondary | ICD-10-CM

## 2015-07-11 DIAGNOSIS — C787 Secondary malignant neoplasm of liver and intrahepatic bile duct: Secondary | ICD-10-CM

## 2015-07-11 DIAGNOSIS — C3412 Malignant neoplasm of upper lobe, left bronchus or lung: Secondary | ICD-10-CM | POA: Diagnosis not present

## 2015-07-11 DIAGNOSIS — R530 Neoplastic (malignant) related fatigue: Secondary | ICD-10-CM

## 2015-07-11 DIAGNOSIS — Z5111 Encounter for antineoplastic chemotherapy: Secondary | ICD-10-CM

## 2015-07-11 MED ORDER — DEXAMETHASONE 4 MG PO TABS: ORAL_TABLET | ORAL | Status: DC

## 2015-07-11 NOTE — Progress Notes (Signed)
Per Abelina Bachelor, RN Pt does not need labs today. Labs were drawn on 07/07/2015.

## 2015-07-11 NOTE — Progress Notes (Addendum)
[facility] OFFICE PROGRESS NOTE   DIAGNOSIS: Stage IIIB/IV non-small cell lung cancer, adenocarcinoma diagnosed in March of 2015.  PRIOR THERAPY:  1) Systemic chemotherapy with carboplatin for AUC of 5 and Alimta 500 mg/M2 every 3 weeks. First dose expected on 04/13/2014. Status post 6 cycles. 2) Maintenance chemotherapy with single agent Alimta 500 mg/M2 every 3 weeks. First cycle on 09/06/2014. Status post 3 cycles. 3) Second course of systemic chemotherapy with carboplatin for AUC of 5 and Alimta 500 MG/M2 every 3 weeks. First cycle 11/08/2014. Status post 6 cycles.  CURRENT THERAPY: Immunotherapy with Nivolumab 3 MG/KG every 2 weeks. First dose 05/16/2015. Status post 3 cycles.  INTERVAL HISTORY:   Shawn Espinoza returns as scheduled. He completed cycle 4 nivolumab 06/27/2015. He denies nausea/vomiting. No mouth sores. No diarrhea. No rash. He continues to have a poor energy level.  Unchanged dyspnea on exertion.He began gabapentin 2-3 weeks ago for a "pins and needles" sensation in his legs at nighttime.  Objective:  Vital signs in last 24 hours:  Blood pressure 144/71, pulse 89, temperature 98.2 F (36.8 C), temperature source Oral, resp. rate 18, height '5\' 7"'$  (1.702 m), weight 193 lb 14.4 oz (87.952 kg), SpO2 97 %.    HEENT:  No thrush or ulcers. Lymphatics:  No palpable cervical or supraclavicular lymph nodes. Resp:  Distant breath sounds. Decreased breath sounds left base. Cardio:  Regular rate and rhythm. GI:  Abdomen soft and nontender. No hepatomegaly. Vascular:  No leg edema. Chronic stasis change at the lower legs bilaterally.     Lab Results:  Lab Results  Component Value Date   WBC 4.7 07/07/2015   HGB 11.7* 07/07/2015   HCT 36.5* 07/07/2015   MCV 87.1 07/07/2015   PLT 174 07/07/2015   NEUTROABS 3.3 07/07/2015    Imaging:  No results found.  Medications: I have reviewed the patient's current medications.  Assessment/Plan: 1.   Stage IV non-small cell lung cancer, adenocarcinoma, currently being treated with immunotherapy with nivolumab. He has completed 4 cycles. Restaging CT evaluation 07/07/2015 shows evidence of progression in the lungs and liver.   Disposition: Shawn Espinoza has completed 4 cycles of nivolumab. Recent restaging CT evaluation shows evidence of progression. Dr.  [name] reviewed the CT result with Shawn Espinoza and recommends discontinuation of nivolumab. Other treatment options were discussed including docetaxel plus cyramza,  single agent gemcitabine, supportive care. Shawn Espinoza would like to proceed with docetaxel/cyramza. Potential toxicities associated with docetaxel were reviewed including alopecia, myelosuppression, nausea, nail changes, worsening of current neuropathy symptoms. Potential toxicities associated with cyramza were reviewed including bleeding, bowel perforation.  He will receive Neulasta support. Potential toxicities associated with Neulasta were reviewed including bone pain, rash, splenic rupture. He will return for cycle 1 in one week. We will see him in follow-up prior to cycle 2 in 3 weeks. He will contact the office in the interim with any problems.  Patient seen with Dr. [name].    Ned Card ANP/GNP-BC   07/11/2015  4:53 PM  ADDENDUM: Hematology/Oncology Attending: I had a face to face encounter with the patient. I recommended his care plan. This is a very pleasant 74 years old white male with metastatic non-small cell lung cancer, adenocarcinoma status post systemic chemotherapy with carboplatin and Alimta as well as course of maintenance treatment with single agent Alimta and most recently immunotherapy with Nivolumab status post 4 cycles. Unfortunately the recent CT scan of the chest, abdomen and pelvis showed evidence for disease progression.  I discussed the scan results with the patient today and I recommended for him to discontinue his treatment with immunotherapy  because of the disease progression. I discussed with the patient several options for treatment of his condition including systemic chemotherapy with docetaxel plus Cyramza versus single agent gemcitabine versus palliative care and hospice referral. After discussion of further options and the adverse effect, the patient would like to proceed with the treatment with docetaxel and Cyramza. He was reminded of the adverse effect of this treatment including but not limited to alopecia, myelosuppression, nausea and vomiting, peripheral neuropathy, liver or renal dysfunction as well as the possibility of pulmonary hemorrhage, GI perforation as well as wound healing delay. He is expected to start the first dose of this treatment next week. He would come back for follow-up visit in 4 weeks for reevaluation before starting cycle #2. The patient was also advised to take Decadron 8 mg by mouth twice a day the day before, day of and day after the chemotherapy. He was advised to call immediately if he has any concerning symptoms in the interval.  Disclaimer: This note was dictated with voice recognition software. Similar sounding words can inadvertently be transcribed and may be missed upon review. Eilleen Kempf., MD 07/16/2015

## 2015-07-11 NOTE — Telephone Encounter (Signed)
Gave adn printed appt sched and avs for pt for July and AUg

## 2015-07-16 DIAGNOSIS — Z5111 Encounter for antineoplastic chemotherapy: Secondary | ICD-10-CM | POA: Insufficient documentation

## 2015-07-18 ENCOUNTER — Other Ambulatory Visit (HOSPITAL_BASED_OUTPATIENT_CLINIC_OR_DEPARTMENT_OTHER): Payer: Medicare Other

## 2015-07-18 ENCOUNTER — Other Ambulatory Visit: Payer: Self-pay | Admitting: *Deleted

## 2015-07-18 ENCOUNTER — Ambulatory Visit: Payer: Medicare Other | Admitting: Internal Medicine

## 2015-07-18 ENCOUNTER — Telehealth: Payer: Self-pay | Admitting: Internal Medicine

## 2015-07-18 ENCOUNTER — Ambulatory Visit (HOSPITAL_BASED_OUTPATIENT_CLINIC_OR_DEPARTMENT_OTHER): Payer: Medicare Other

## 2015-07-18 ENCOUNTER — Other Ambulatory Visit: Payer: Self-pay | Admitting: Internal Medicine

## 2015-07-18 ENCOUNTER — Ambulatory Visit: Payer: Medicare Other

## 2015-07-18 DIAGNOSIS — Z5189 Encounter for other specified aftercare: Secondary | ICD-10-CM | POA: Diagnosis not present

## 2015-07-18 DIAGNOSIS — C787 Secondary malignant neoplasm of liver and intrahepatic bile duct: Secondary | ICD-10-CM

## 2015-07-18 DIAGNOSIS — C3492 Malignant neoplasm of unspecified part of left bronchus or lung: Secondary | ICD-10-CM

## 2015-07-18 DIAGNOSIS — C3411 Malignant neoplasm of upper lobe, right bronchus or lung: Secondary | ICD-10-CM

## 2015-07-18 DIAGNOSIS — D709 Neutropenia, unspecified: Secondary | ICD-10-CM

## 2015-07-18 DIAGNOSIS — Z95828 Presence of other vascular implants and grafts: Secondary | ICD-10-CM

## 2015-07-18 DIAGNOSIS — C3412 Malignant neoplasm of upper lobe, left bronchus or lung: Secondary | ICD-10-CM

## 2015-07-18 DIAGNOSIS — D61818 Other pancytopenia: Secondary | ICD-10-CM

## 2015-07-18 LAB — COMPREHENSIVE METABOLIC PANEL (CC13)
ALBUMIN: 3.2 g/dL — AB (ref 3.5–5.0)
ALK PHOS: 67 U/L (ref 40–150)
ALT: 15 U/L (ref 0–55)
AST: 14 U/L (ref 5–34)
Anion Gap: 13 mEq/L — ABNORMAL HIGH (ref 3–11)
BILIRUBIN TOTAL: 0.52 mg/dL (ref 0.20–1.20)
BUN: 33.9 mg/dL — ABNORMAL HIGH (ref 7.0–26.0)
CHLORIDE: 101 meq/L (ref 98–109)
CO2: 26 meq/L (ref 22–29)
CREATININE: 1.3 mg/dL (ref 0.7–1.3)
Calcium: 9.3 mg/dL (ref 8.4–10.4)
EGFR: 53 mL/min/{1.73_m2} — ABNORMAL LOW (ref >90)
GLUCOSE: 390 mg/dL — AB (ref 70–140)
POTASSIUM: 4.1 meq/L (ref 3.5–5.1)
Sodium: 140 mEq/L (ref 136–145)
TOTAL PROTEIN: 6.5 g/dL (ref 6.4–8.3)

## 2015-07-18 LAB — CBC WITH DIFFERENTIAL/PLATELET
BASO%: 1.6 % (ref 0.0–2.0)
BASOS ABS: 0 10*3/uL (ref 0.0–0.1)
EOS%: 0 % (ref 0.0–7.0)
Eosinophils Absolute: 0 10*3/uL (ref 0.0–0.5)
HCT: 37.7 % — ABNORMAL LOW (ref 38.4–49.9)
HGB: 12.4 g/dL — ABNORMAL LOW (ref 13.0–17.1)
LYMPH%: 35.9 % (ref 14.0–49.0)
MCH: 27.7 pg (ref 27.2–33.4)
MCHC: 32.9 g/dL (ref 32.0–36.0)
MCV: 84.3 fL (ref 79.3–98.0)
MONO#: 0.1 10*3/uL (ref 0.1–0.9)
MONO%: 17.2 % — ABNORMAL HIGH (ref 0.0–14.0)
NEUT#: 0.3 10*3/uL — CL (ref 1.5–6.5)
NEUT%: 45.3 % (ref 39.0–75.0)
Platelets: 161 10*3/uL (ref 140–400)
RBC: 4.47 10*6/uL (ref 4.20–5.82)
RDW: 14.3 % (ref 11.0–14.6)
WBC: 0.6 10*3/uL — CL (ref 4.0–10.3)
lymph#: 0.2 10*3/uL — ABNORMAL LOW (ref 0.9–3.3)
nRBC: 0 % (ref 0–0)

## 2015-07-18 MED ORDER — SODIUM CHLORIDE 0.9 % IJ SOLN
10.0000 mL | INTRAMUSCULAR | Status: DC | PRN
  Administered 2015-07-18: 10 mL via INTRAVENOUS
  Filled 2015-07-18: qty 10

## 2015-07-18 MED ORDER — HEPARIN SOD (PORK) LOCK FLUSH 100 UNIT/ML IV SOLN
500.0000 [IU] | Freq: Once | INTRAVENOUS | Status: AC
  Administered 2015-07-18: 500 [IU] via INTRAVENOUS
  Filled 2015-07-18: qty 5

## 2015-07-18 MED ORDER — TBO-FILGRASTIM 480 MCG/0.8ML ~~LOC~~ SOSY
480.0000 ug | PREFILLED_SYRINGE | Freq: Once | SUBCUTANEOUS | Status: AC
  Administered 2015-07-18: 480 ug via SUBCUTANEOUS
  Filled 2015-07-18: qty 0.8

## 2015-07-18 NOTE — Telephone Encounter (Signed)
Gave and printed appt sched adn avs fo rpt for Aug

## 2015-07-18 NOTE — Patient Instructions (Signed)

## 2015-07-18 NOTE — Progress Notes (Unsigned)
Labs reviewed with Dr. [name].  Chemo held today, to be rescheduled for next week.  Pt to get granix injection x3 days.  Reviewed with patient.  Education given about neutropenic precautions.

## 2015-07-18 NOTE — Patient Instructions (Signed)

## 2015-07-19 ENCOUNTER — Ambulatory Visit (HOSPITAL_BASED_OUTPATIENT_CLINIC_OR_DEPARTMENT_OTHER): Payer: Medicare Other

## 2015-07-19 ENCOUNTER — Ambulatory Visit: Payer: Medicare Other

## 2015-07-19 VITALS — BP 151/91 | HR 84 | Temp 97.6°F

## 2015-07-19 DIAGNOSIS — Z5189 Encounter for other specified aftercare: Secondary | ICD-10-CM | POA: Diagnosis not present

## 2015-07-19 DIAGNOSIS — C3412 Malignant neoplasm of upper lobe, left bronchus or lung: Secondary | ICD-10-CM | POA: Diagnosis present

## 2015-07-19 DIAGNOSIS — D61818 Other pancytopenia: Secondary | ICD-10-CM

## 2015-07-19 DIAGNOSIS — D709 Neutropenia, unspecified: Secondary | ICD-10-CM

## 2015-07-19 DIAGNOSIS — C787 Secondary malignant neoplasm of liver and intrahepatic bile duct: Secondary | ICD-10-CM

## 2015-07-19 MED ORDER — TBO-FILGRASTIM 480 MCG/0.8ML ~~LOC~~ SOSY
480.0000 ug | PREFILLED_SYRINGE | Freq: Once | SUBCUTANEOUS | Status: AC
  Administered 2015-07-19: 480 ug via SUBCUTANEOUS
  Filled 2015-07-19: qty 0.8

## 2015-07-19 NOTE — Patient Instructions (Signed)

## 2015-07-20 ENCOUNTER — Encounter: Payer: Self-pay | Admitting: Nurse Practitioner

## 2015-07-20 ENCOUNTER — Ambulatory Visit (HOSPITAL_BASED_OUTPATIENT_CLINIC_OR_DEPARTMENT_OTHER): Payer: Medicare Other

## 2015-07-20 ENCOUNTER — Telehealth: Payer: Self-pay | Admitting: *Deleted

## 2015-07-20 ENCOUNTER — Ambulatory Visit (HOSPITAL_BASED_OUTPATIENT_CLINIC_OR_DEPARTMENT_OTHER): Payer: Medicare Other | Admitting: Nurse Practitioner

## 2015-07-20 ENCOUNTER — Ambulatory Visit: Payer: Medicare Other

## 2015-07-20 ENCOUNTER — Other Ambulatory Visit: Payer: Self-pay | Admitting: Nurse Practitioner

## 2015-07-20 VITALS — BP 124/67 | HR 94 | Temp 98.1°F | Resp 18 | Wt 182.5 lb

## 2015-07-20 DIAGNOSIS — D61810 Antineoplastic chemotherapy induced pancytopenia: Secondary | ICD-10-CM

## 2015-07-20 DIAGNOSIS — C3412 Malignant neoplasm of upper lobe, left bronchus or lung: Secondary | ICD-10-CM

## 2015-07-20 DIAGNOSIS — D701 Agranulocytosis secondary to cancer chemotherapy: Secondary | ICD-10-CM

## 2015-07-20 DIAGNOSIS — M791 Myalgia, unspecified site: Secondary | ICD-10-CM | POA: Insufficient documentation

## 2015-07-20 DIAGNOSIS — D709 Neutropenia, unspecified: Secondary | ICD-10-CM

## 2015-07-20 DIAGNOSIS — D61818 Other pancytopenia: Secondary | ICD-10-CM

## 2015-07-20 DIAGNOSIS — C787 Secondary malignant neoplasm of liver and intrahepatic bile duct: Secondary | ICD-10-CM | POA: Diagnosis not present

## 2015-07-20 DIAGNOSIS — T451X5A Adverse effect of antineoplastic and immunosuppressive drugs, initial encounter: Secondary | ICD-10-CM

## 2015-07-20 DIAGNOSIS — C3492 Malignant neoplasm of unspecified part of left bronchus or lung: Secondary | ICD-10-CM

## 2015-07-20 MED ORDER — TBO-FILGRASTIM 480 MCG/0.8ML ~~LOC~~ SOSY
480.0000 ug | PREFILLED_SYRINGE | Freq: Once | SUBCUTANEOUS | Status: AC
  Administered 2015-07-20: 480 ug via SUBCUTANEOUS
  Filled 2015-07-20: qty 0.8

## 2015-07-20 NOTE — Progress Notes (Signed)
   Subjective:    Patient ID: Shawn Espinoza, male    DOB: 05/28/1941, 74 y.o.   MRN: [id number]  HPI Comments: "I just need to get my toenails cut"  Patient states he can't get down to cut his toenails anymore. He does have neuropathy and some swelling in his feet.  Diabetic - last A1C was 7.6  Diabetes      Review of Systems  All other systems reviewed and are negative.      Objective:   Physical Exam GENERAL APPEARANCE: Alert, conversant. Appropriately groomed. No acute distress.  VASCULAR: Pedal pulses palpable at 1/4 DP and PT bilateral.  Capillary refill time is immediate to all digits,  Proximal to distal temperature is warm to warm.   NEUROLOGIC: sensation is intact epicritically and protectively to 5.07 monofilament at 2/5 sites bilateral.  Light touch is intact bilateral, vibratory sensation decreased bilateral, achilles tendon reflex is intact bilateral.  MUSCULOSKELETAL: acceptable muscle strength, tone and stability bilateral.  Intrinsic muscluature intact bilateral.    DERMATOLOGIC: skin color, texture, and turger are within normal limits.  No preulcerative lesions are seen, no interdigital maceration noted.  No open lesions present.  Digital nails are symptomatic with yellow brown discoloration present with thickening of nail plates and mycotic appearance x 10.     Assessment & Plan:  Diabetes with neuropathy, symptomatic mycotic toenails.  Plan:  Diabetic foot evaluation carried out.  Symptomatic nails debrided without complication.  Recommend routine pallative care and foot checks every 3 months.  

## 2015-07-20 NOTE — Assessment & Plan Note (Signed)
Patient was scheduled to initiate new regimen of docetaxel/cyramza chemotherapy this past Tuesday, 07/18/2015; but it was noted at that time that the PVC was 0.6, and ANC was 0.3.  Hemoglobin and poorly count remained stable.  Decision was made to hold initiation of new chemotherapy regimen; and patient was ordered 3 days of Granix 480 microgram injections for growth factor support.  Patient is scheduled to return on 07/25/2015 for labs and for cycle of his new chemotherapy.

## 2015-07-20 NOTE — Assessment & Plan Note (Addendum)
Pt last received Nivolumab  immunotherapy on 06/27/2015.  Unfortunately, restaging scans did reveal some progression of disease.  Patient was scheduled to initiate cycle 1 of new regimen docetaxel/cyramza chemotherapy this past Tuesday, 07/18/2015; but chemotherapy was delayed by one week do to significant neutropenia.  Patient was ordered 3 days of Granix 480 g injections for growth factor support.  Patient presents back to the [facility] today for his third day of these injections.  He is complaining of some progressive achiness/myalgias to his entire upper body since undergoing these injections.  Most likely, the myalgia complaint is secondary to the injections.  Patient is scheduled to return on 07/25/2015 for labs and chemotherapy.

## 2015-07-20 NOTE — Assessment & Plan Note (Signed)
Pt last received Nivolumab  immunotherapy on 06/27/2015.  Unfortunately, restaging scans did reveal some progression of disease.  Patient was scheduled to initiate cycle 1 of new regimen docetaxel/cyramza chemotherapy this past Tuesday, 07/18/2015; but chemotherapy was delayed by one week do to significant neutropenia.  Patient was ordered 3 days of Granix 480 g injections for growth factor support.  Patient presents back to the [facility] today for his third day of these injections.  He is complaining of some progressive achiness/myalgias to his entire upper body since undergoing these injections.  Most likely, the myalgia complaint is secondary to the Granix injections.  Patient states that he took ibuprofen 800 mg last night and his myalgias.  Did greatly improve.  Patient was advised to continue the ibuprofen on an as-needed basis; and he may also try Claritin over-the-counter as well.  Patient was advised to call/return if he develops any worsening symptoms whatsoever.

## 2015-07-20 NOTE — Telephone Encounter (Signed)
Returned call to spouse Kandra Nicolas about [name].  "His counts were too low for treatment and has received two filgrastin injections.  Yesterday he began having pain to the neck, shoulder and back and is still having pain.  We called the on-call nurses who instructed [place] to call for him to be seen today.  We do not want the injection if he doesn't have to have it.  He's taken methocarbamol and it hasn't touched the pain.  He can't walk or move well.  He didn't sleep well."     Denies arthritis or bone and joint diseases.  This nurse advised he take ibuprofen, try warm compresses and yes he need today's injection.  Kittie asked if he can be seen to try something for the pain.  Will schedule with [facility] at 11:00 before today's injection at 11:30.  Has ultram and ibuprofen 800 mg on hand from previous procedures.  Will take Ibuprofen 800 mg now.

## 2015-07-20 NOTE — Progress Notes (Signed)
SYMPTOM MANAGEMENT CLINIC   HPI: Shawn Espinoza 74 y.o. male diagnosed with lung cancer.    Pt last received Nivolumab  immunotherapy on 06/27/2015.  Unfortunately, restaging scans did reveal some progression of disease.  Patient was scheduled to initiate cycle 1 of new regimen docetaxel/cyramza chemotherapy this past Tuesday, 07/18/2015; but chemotherapy was delayed by one week do to significant neutropenia.  Patient was ordered 3 days of Granix 480 g injections for growth factor support.  Patient presents back to the [place] today for his third day of these injections.  He is complaining of some progressive achiness/myalgias to his entire upper body since undergoing these injections.  Most likely, the myalgia complaint is secondary to the Granix injections.  Patient states that he took ibuprofen 800 mg last night and his myalgias.  Did greatly improve.  Patient was advised to continue the ibuprofen on an as-needed basis; and he may also try Claritin over-the-counter as well.  Patient was advised to call/return if he develops any worsening symptoms whatsoever.  HPI  ROS  Past Medical History  Diagnosis Date  . History of diabetes mellitus, type II     resolved with diet, h/o neuropathy  . Diverticulosis of colon   . Hyperlipidemia   . Hypertension   . Fatty liver 02/29/00    abd ultrasound  . Lower back pain   . Systolic murmur 6226    2Decho - normal LV fxn, EF 55%, mild AS, biatrial enlargement  . History of tobacco use quit 1990s  . 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 Personal history of colonic adenomas 07/06/2013  . Hypertensive retinopathy of both eyes 2015    mild  . Dysrhythmia     Atrial fib (found 07/2014)  . Shortness of breath   . Pneumonia   . GERD (gastroesophageal reflux disease)     occasional  . Arthritis   . Neuropathy   .  Constipation   . 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 Diabetes mellitus without complication     Type 2  . Depression   . 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 Colonoscopy  2004  . Colonoscopy  2014    tubular adenoma x1, mod diverticulosis ([place])  . Flexible bronchoscopy  02/2014    WNL  . Video bronchoscopy Bilateral 03/17/2014    Procedure: VIDEO BRONCHOSCOPY WITH FLUORO;  Surgeon: Tanda Rockers, MD;  Location: [facility];  Service: Cardiopulmonary;  Laterality: Bilateral;  . Chest tube insertion Left 08/26/2014    Procedure: INSERTION OF LEFT  PLEURAL DRAINAGE CATHETER;  Surgeon: Ivin Poot, MD;  Location: [place];  Service: Thoracic;  Laterality: Left;  . Cataract extraction Right 03/2015    Dr [name]  . Portacath placement Right 03/31/2015    Procedure: INSERTION PORT-A-CATH;  Surgeon: Ivin Poot, MD;  Location: [facility] OR;  Service: Thoracic;  Laterality: Right;  . Removal of pleural drainage catheter Left 03/31/2015    Procedure: REMOVAL OF PLEURAL DRAINAGE CATHETER;  Surgeon: Ivin Poot, MD;  Location: [place];  Service: Thoracic;  Laterality: Left;    has Steroid-induced diabetes mellitus; HLD (hyperlipidemia); History of tobacco use; Essential hypertension; Hemorrhoids; DIVERTICULOSIS, COLON; FATTY LIVER  DISEASE; Medicare annual wellness visit, subsequent; Positive hepatitis C antibody test; Systolic murmur; Lower back pain; Polycythemia; BPH (benign prostatic hypertrophy); Right knee pain; Personal history of colonic adenoma; DOE (dyspnea on exertion); Adenocarcinoma of left lung, stage 4; Liver lesion; Recurrent left pleural effusion; Skin rash; Other pancytopenia; Hypoalbuminemia; Interstitial pneumonitis; CHF (congestive heart failure); Thrombocytopenia; Peripheral edema; Protein-calorie malnutrition; Orthostatic hypotension; Neoplastic malignant  related fatigue; Atrial fibrillation, unspecified; Hypothyroid; Thrush; Hearing loss; Advanced care planning/counseling discussion; Antineoplastic chemotherapy induced pancytopenia; Neuropathic pain, leg, bilateral; Encounter for antineoplastic chemotherapy; Neutropenia; and Myalgia on his problem list.    is allergic to candesartan cilexetil; diltiazem hcl; doxazosin mesylate; nifedipine; pravastatin; red yeast rice; rosuvastatin; sertraline; and simvastatin.    Medication List       This list is accurate as of: 07/20/15 12:56 PM.  Always use your most recent med list.               albuterol 108 (90 BASE) MCG/ACT inhaler  Commonly known as:  PROVENTIL HFA;VENTOLIN HFA  Inhale 2 puffs into the lungs every 6 (six) hours as needed for wheezing or shortness of breath.     apixaban 5 MG Tabs tablet  Commonly known as:  ELIQUIS  Take 1 tablet (5 mg total) by mouth 2 (two) times daily.     cetirizine 10 MG tablet  Commonly known as:  ZYRTEC  Take 10 mg by mouth daily as needed for allergies or rhinitis.     dexamethasone 4 MG tablet  Commonly known as:  DECADRON  2 tab po bid, the day before, day of and day after chemotherapy every 3 weeks     diphenoxylate-atropine 2.5-0.025 MG per tablet  Commonly known as:  LOMOTIL  Take 2 tablets by mouth 4 (four) times daily as needed for diarrhea or loose stools.     folic acid 1 MG tablet  Commonly known as:  FOLVITE  TAKE 1 TABLET BY MOUTH EVERY DAY     gabapentin 300 MG capsule  Commonly known as:  NEURONTIN  Take 1 capsule (300 mg total) by mouth at bedtime.     glucose blood test strip  Use as instructed to check blood sugar once daily and as needed. Dx: E09.9     HYDROcodone-acetaminophen 5-325 MG per tablet  Commonly known as:  NORCO/VICODIN  Take 1 tablet by mouth every 6 (six) hours as needed for moderate pain.     ICAPS Caps  Take by mouth 2 (two) times daily.     ketorolac 0.5 % ophthalmic solution  Commonly known as:   ACULAR  PLACE 1 DROP IN LEFT EYE 4 TIMES DAILY STARTING 1 WEEK PRIOR TO SURGERY     KLOR-CON M10 10 MEQ tablet  Generic drug:  potassium chloride     Lancets Misc  Use to check sugar once daily and as needed Dx: B44.9 **ONE TOUCH DELICA**     lidocaine-prilocaine cream  Commonly known as:  EMLA  Apply 1 application topically as needed.     magic mouthwash w/lidocaine Soln  Take 5 mLs by mouth 3 (three) times daily as needed for mouth pain.     metoprolol tartrate 25 MG tablet  Commonly known as:  LOPRESSOR  Take 1 tablet (25 mg total) by mouth 2 (two) times daily.     ofloxacin 0.3 % ophthalmic solution  Commonly known as:  OCUFLOX     polyethylene glycol packet  Commonly known as:  MIRALAX / GLYCOLAX  Take 17 g by  mouth daily.     torsemide 20 MG tablet  Commonly known as:  DEMADEX  Take 1 tablet (20 mg total) by mouth 2 (two) times daily.         PHYSICAL EXAMINATION  Oncology Vitals 07/20/2015 07/19/2015 07/11/2015 07/07/2015 07/05/2015 06/27/2015 06/14/2015  Height - - 170 cm - - 170 cm -  Weight 82.781 kg - 87.952 kg - 88.052 kg 88.86 kg 90.175 kg  Weight (lbs) 182 lbs 8 oz - 193 lbs 14 oz - 194 lbs 2 oz 195 lbs 14 oz 198 lbs 13 oz  BMI (kg/m2) - - 30.37 kg/m2 - - 30.68 kg/m2 -  Temp 98.1 97.6 98.2 98 98.1 97.9 98.3  Pulse 94 84 89 72 77 102 68  Resp 18 - 18 16 - 20 -  SpO2 98 - 97 100 - 100 98  BSA (m2) - - 2.04 m2 - - 2.05 m2 -   BP Readings from Last 3 Encounters:  07/20/15 124/67  07/19/15 151/91  07/11/15 144/71    Physical Exam  Constitutional: He is oriented to person, place, and time and well-developed, well-nourished, and in no distress.  HENT:  Head: Normocephalic and atraumatic.  Eyes: Conjunctivae and EOM are normal. Pupils are equal, round, and reactive to light. Right eye exhibits no discharge. Left eye exhibits no discharge. No scleral icterus.  Neck: Normal range of motion.  Pulmonary/Chest: Effort normal. No respiratory distress.    Musculoskeletal: Normal range of motion.  Neurological: He is alert and oriented to person, place, and time. Gait normal.  Skin: Skin is warm and dry.  Psychiatric: Affect normal.  Nursing note and vitals reviewed.   LABORATORY DATA:. Appointment on 07/18/2015  Component Date Value Ref Range Status  . WBC 07/18/2015 0.6* 4.0 - 10.3 10e3/uL Final  . NEUT# 07/18/2015 0.3* 1.5 - 6.5 10e3/uL Final  . HGB 07/18/2015 12.4* 13.0 - 17.1 g/dL Final  . HCT 07/18/2015 37.7* 38.4 - 49.9 % Final  . Platelets 07/18/2015 161  140 - 400 10e3/uL Final  . MCV 07/18/2015 84.3  79.3 - 98.0 fL Final  . MCH 07/18/2015 27.7  27.2 - 33.4 pg Final  . MCHC 07/18/2015 32.9  32.0 - 36.0 g/dL Final  . RBC 07/18/2015 4.47  4.20 - 5.82 10e6/uL Final  . RDW 07/18/2015 14.3  11.0 - 14.6 % Final  . lymph# 07/18/2015 0.2* 0.9 - 3.3 10e3/uL Final  . MONO# 07/18/2015 0.1  0.1 - 0.9 10e3/uL Final  . Eosinophils Absolute 07/18/2015 0.0  0.0 - 0.5 10e3/uL Final  . Basophils Absolute 07/18/2015 0.0  0.0 - 0.1 10e3/uL Final  . NEUT% 07/18/2015 45.3  39.0 - 75.0 % Final  . LYMPH% 07/18/2015 35.9  14.0 - 49.0 % Final  . MONO% 07/18/2015 17.2* 0.0 - 14.0 % Final  . EOS% 07/18/2015 0.0  0.0 - 7.0 % Final  . BASO% 07/18/2015 1.6  0.0 - 2.0 % Final  . nRBC 07/18/2015 0  0 - 0 % Final  . Sodium 07/18/2015 140  136 - 145 mEq/L Final  . Potassium 07/18/2015 4.1  3.5 - 5.1 mEq/L Final  . Chloride 07/18/2015 101  98 - 109 mEq/L Final  . CO2 07/18/2015 26  22 - 29 mEq/L Final  . Glucose 07/18/2015 390* 70 - 140 mg/dl Final  . BUN 07/18/2015 33.9* 7.0 - 26.0 mg/dL Final  . Creatinine 07/18/2015 1.3  0.7 - 1.3 mg/dL Final  . Total Bilirubin 07/18/2015 0.52  0.20 - 1.20  mg/dL Final  . Alkaline Phosphatase 07/18/2015 67  40 - 150 U/L Final  . AST 07/18/2015 14  5 - 34 U/L Final  . ALT 07/18/2015 15  0 - 55 U/L Final  . Total Protein 07/18/2015 6.5  6.4 - 8.3 g/dL Final  . Albumin 07/18/2015 3.2* 3.5 - 5.0 g/dL Final  . Calcium  07/18/2015 9.3  8.4 - 10.4 mg/dL Final  . Anion Gap 07/18/2015 13* 3 - 11 mEq/L Final  . EGFR 07/18/2015 53* >90 ml/min/1.73 m2 Final   eGFR is calculated using the CKD-EPI Creatinine Equation (2009)     RADIOGRAPHIC STUDIES: No results found.  ASSESSMENT/PLAN:    Adenocarcinoma of left lung, stage 4 Pt last received Nivolumab  immunotherapy on 06/27/2015.  Unfortunately, restaging scans did reveal some progression of disease.  Patient was scheduled to initiate cycle 1 of new regimen docetaxel/cyramza chemotherapy this past Tuesday, 07/18/2015; but chemotherapy was delayed by one week do to significant neutropenia.  Patient was ordered 3 days of Granix 480 g injections for growth factor support.  Patient presents back to the [place] today for his third day of these injections.  He is complaining of some progressive achiness/myalgias to his entire upper body since undergoing these injections.  Most likely, the myalgia complaint is secondary to the injections.  Patient is scheduled to return on 07/25/2015 for labs and chemotherapy.  Antineoplastic chemotherapy induced pancytopenia Patient was scheduled to initiate new regimen of docetaxel/cyramza chemotherapy this past Tuesday, 07/18/2015; but it was noted at that time that the PVC was 0.6, and ANC was 0.3.  Hemoglobin and poorly count remained stable.  Decision was made to hold initiation of new chemotherapy regimen; and patient was ordered 3 days of Granix 480 microgram injections for growth factor support.  Patient is scheduled to return on 07/25/2015 for labs and for cycle of his new chemotherapy.  Myalgia Pt last received Nivolumab  immunotherapy on 06/27/2015.  Unfortunately, restaging scans did reveal some progression of disease.  Patient was scheduled to initiate cycle 1 of new regimen docetaxel/cyramza chemotherapy this past Tuesday, 07/18/2015; but chemotherapy was delayed by one week do to significant  neutropenia.  Patient was ordered 3 days of Granix 480 g injections for growth factor support.  Patient presents back to the [place] today for his third day of these injections.  He is complaining of some progressive achiness/myalgias to his entire upper body since undergoing these injections.  Most likely, the myalgia complaint is secondary to the Granix injections.  Patient states that he took ibuprofen 800 mg last night and his myalgias.  Did greatly improve.  Patient was advised to continue the ibuprofen on an as-needed basis; and he may also try Claritin over-the-counter as well.  Patient was advised to call/return if he develops any worsening symptoms whatsoever.     Patient stated understanding of all instructions; and was in agreement with this plan of care. The patient knows to call the clinic with any problems, questions or concerns.   Review/collaboration with Dr. [name] regarding all aspects of patient's visit today.   Total time spent with patient was 25 minutes;  with greater than 75 percent of that time spent in face to face counseling regarding patient's symptoms,  and coordination of care and follow up.  Disclaimer: This note was dictated with voice recognition software. Similar sounding words can inadvertently be transcribed and may not be corrected upon review.   Drue Second, NP 07/20/2015

## 2015-07-25 ENCOUNTER — Other Ambulatory Visit (HOSPITAL_BASED_OUTPATIENT_CLINIC_OR_DEPARTMENT_OTHER): Payer: Medicare Other

## 2015-07-25 ENCOUNTER — Ambulatory Visit: Payer: Medicare Other

## 2015-07-25 ENCOUNTER — Ambulatory Visit (HOSPITAL_BASED_OUTPATIENT_CLINIC_OR_DEPARTMENT_OTHER): Payer: Medicare Other

## 2015-07-25 ENCOUNTER — Other Ambulatory Visit: Payer: Self-pay | Admitting: Internal Medicine

## 2015-07-25 VITALS — BP 139/82 | HR 72 | Temp 97.5°F | Resp 22

## 2015-07-25 DIAGNOSIS — C3412 Malignant neoplasm of upper lobe, left bronchus or lung: Secondary | ICD-10-CM

## 2015-07-25 DIAGNOSIS — Z95828 Presence of other vascular implants and grafts: Secondary | ICD-10-CM

## 2015-07-25 DIAGNOSIS — C3492 Malignant neoplasm of unspecified part of left bronchus or lung: Secondary | ICD-10-CM

## 2015-07-25 DIAGNOSIS — R739 Hyperglycemia, unspecified: Secondary | ICD-10-CM | POA: Diagnosis not present

## 2015-07-25 DIAGNOSIS — C787 Secondary malignant neoplasm of liver and intrahepatic bile duct: Secondary | ICD-10-CM

## 2015-07-25 DIAGNOSIS — Z5111 Encounter for antineoplastic chemotherapy: Secondary | ICD-10-CM

## 2015-07-25 DIAGNOSIS — Z5112 Encounter for antineoplastic immunotherapy: Secondary | ICD-10-CM | POA: Diagnosis not present

## 2015-07-25 LAB — CBC WITH DIFFERENTIAL/PLATELET
BASO%: 0.5 % (ref 0.0–2.0)
Basophils Absolute: 0 10*3/uL (ref 0.0–0.1)
EOS%: 0.1 % (ref 0.0–7.0)
Eosinophils Absolute: 0 10*3/uL (ref 0.0–0.5)
HCT: 38.7 % (ref 38.4–49.9)
HGB: 12.5 g/dL — ABNORMAL LOW (ref 13.0–17.1)
LYMPH%: 9.3 % — AB (ref 14.0–49.0)
MCH: 27.2 pg (ref 27.2–33.4)
MCHC: 32.2 g/dL (ref 32.0–36.0)
MCV: 84.3 fL (ref 79.3–98.0)
MONO#: 0.2 10*3/uL (ref 0.1–0.9)
MONO%: 4.6 % (ref 0.0–14.0)
NEUT#: 2.9 10*3/uL (ref 1.5–6.5)
NEUT%: 85.5 % — ABNORMAL HIGH (ref 39.0–75.0)
Platelets: 126 10*3/uL — ABNORMAL LOW (ref 140–400)
RBC: 4.59 10*6/uL (ref 4.20–5.82)
RDW: 16.1 % — AB (ref 11.0–14.6)
WBC: 3.4 10*3/uL — AB (ref 4.0–10.3)
lymph#: 0.3 10*3/uL — ABNORMAL LOW (ref 0.9–3.3)

## 2015-07-25 LAB — COMPREHENSIVE METABOLIC PANEL (CC13)
ALT: 22 U/L (ref 0–55)
ANION GAP: 11 meq/L (ref 3–11)
AST: 15 U/L (ref 5–34)
Albumin: 3.3 g/dL — ABNORMAL LOW (ref 3.5–5.0)
Alkaline Phosphatase: 67 U/L (ref 40–150)
BUN: 28.7 mg/dL — AB (ref 7.0–26.0)
CO2: 26 mEq/L (ref 22–29)
CREATININE: 1.1 mg/dL (ref 0.7–1.3)
Calcium: 9.4 mg/dL (ref 8.4–10.4)
Chloride: 98 mEq/L (ref 98–109)
EGFR: 64 mL/min/{1.73_m2} — ABNORMAL LOW (ref >90)
Glucose: 578 mg/dl (ref 70–140)
POTASSIUM: 4.2 meq/L (ref 3.5–5.1)
SODIUM: 135 meq/L — AB (ref 136–145)
Total Bilirubin: 0.5 mg/dL (ref 0.20–1.20)
Total Protein: 6.5 g/dL (ref 6.4–8.3)

## 2015-07-25 LAB — UA PROTEIN, DIPSTICK - CHCC: Protein, ur: NEGATIVE mg/dL

## 2015-07-25 MED ORDER — ACETAMINOPHEN 325 MG PO TABS
650.0000 mg | ORAL_TABLET | Freq: Once | ORAL | Status: AC
  Administered 2015-07-25: 650 mg via ORAL

## 2015-07-25 MED ORDER — INSULIN REGULAR HUMAN 100 UNIT/ML IJ SOLN
20.0000 [IU] | Freq: Once | INTRAMUSCULAR | Status: AC
  Administered 2015-07-25: 20 [IU] via SUBCUTANEOUS
  Filled 2015-07-25: qty 0.2

## 2015-07-25 MED ORDER — DIPHENHYDRAMINE HCL 50 MG/ML IJ SOLN
50.0000 mg | Freq: Once | INTRAMUSCULAR | Status: AC
  Administered 2015-07-25: 50 mg via INTRAVENOUS

## 2015-07-25 MED ORDER — SODIUM CHLORIDE 0.9 % IJ SOLN
10.0000 mL | INTRAMUSCULAR | Status: DC | PRN
Start: 2015-07-25 — End: 2015-07-25
  Administered 2015-07-25: 10 mL via INTRAVENOUS
  Filled 2015-07-25: qty 10

## 2015-07-25 MED ORDER — SODIUM CHLORIDE 0.9 % IJ SOLN
10.0000 mL | INTRAMUSCULAR | Status: DC | PRN
  Administered 2015-07-25: 10 mL
  Filled 2015-07-25: qty 10

## 2015-07-25 MED ORDER — ACETAMINOPHEN 325 MG PO TABS
ORAL_TABLET | ORAL | Status: AC
  Filled 2015-07-25: qty 2

## 2015-07-25 MED ORDER — SODIUM CHLORIDE 0.9 % IV SOLN
Freq: Once | INTRAVENOUS | Status: AC
  Administered 2015-07-25: 15:00:00 via INTRAVENOUS
  Filled 2015-07-25: qty 4

## 2015-07-25 MED ORDER — DIPHENHYDRAMINE HCL 50 MG/ML IJ SOLN
INTRAMUSCULAR | Status: AC
  Filled 2015-07-25: qty 1

## 2015-07-25 MED ORDER — HEPARIN SOD (PORK) LOCK FLUSH 100 UNIT/ML IV SOLN
500.0000 [IU] | Freq: Once | INTRAVENOUS | Status: AC | PRN
  Administered 2015-07-25: 500 [IU]
  Filled 2015-07-25: qty 5

## 2015-07-25 MED ORDER — SODIUM CHLORIDE 0.9 % IV SOLN
10.0000 mg/kg | Freq: Once | INTRAVENOUS | Status: AC
  Administered 2015-07-25: 900 mg via INTRAVENOUS
  Filled 2015-07-25: qty 90

## 2015-07-25 MED ORDER — SODIUM CHLORIDE 0.9 % IV SOLN
Freq: Once | INTRAVENOUS | Status: AC
  Administered 2015-07-25: 13:00:00 via INTRAVENOUS

## 2015-07-25 MED ORDER — DOCETAXEL CHEMO INJECTION 160 MG/16ML
75.0000 mg/m2 | Freq: Once | INTRAVENOUS | Status: AC
  Administered 2015-07-25: 150 mg via INTRAVENOUS
  Filled 2015-07-25: qty 15

## 2015-07-25 NOTE — Progress Notes (Signed)
Instructed pt to follow up with PCP about high glucose levels to adjust medications.

## 2015-07-25 NOTE — Patient Instructions (Signed)
Ramucirumab injection What is this medicine? RAMUCIRUMAB (ra mue SIR ue mab) is a chemotherapy drug. It is used to treat stomach cancer, colorectal cancer, or lung cancer. This drug targets a specific protein receptor on cancer cells and stops the cancer cells from growing. This medicine may be used for other purposes; ask your health care provider or pharmacist if you have questions. COMMON BRAND NAME(S): Cyramza What should I tell my health care provider before I take this medicine? They need to know if you have any of these conditions: -bleeding disorders -blood clots -heart disease, including heart failure, heart attack, or chest pain (angina) -high blood pressure -infection (especially a virus infection such as chickenpox, cold sores, or herpes) -protein in your urine -recent surgery -stroke -an unusual or allergic reaction to ramucirumab, other medicines, foods, dyes, or preservatives -pregnant or trying to get pregnant -breast-feeding How should I use this medicine? This medicine is for infusion into a vein. It is given by a health care professional in a hospital or clinic setting. Talk to your pediatrician regarding the use of this medicine in children. Special care may be needed. Overdosage: If you think you've taken too much of this medicine contact a poison control center or emergency room at once. Overdosage: If you think you have taken too much of this medicine contact a poison control center or emergency room at once. NOTE: This medicine is only for you. Do not share this medicine with others. What if I miss a dose? It is important not to miss your dose. Call your doctor or health care professional if you are unable to keep an appointment. What may interact with this medicine? Interactions have not been studied. This list may not describe all possible interactions. Give your health care provider a list of all the medicines, herbs, non-prescription drugs, or dietary  supplements you use. Also tell them if you smoke, drink alcohol, or use illegal drugs. Some items may interact with your medicine. What should I watch for while using this medicine? Your condition will be monitored carefully while you are receiving this medicine. You will need to to check your blood pressure and have your blood and urine tested while you are taking this medicine. Your condition will be monitored carefully while you are receiving this medicine. This medicine may increase your risk to bruise or bleed. Call your doctor or health care professional if you notice any unusual bleeding. This medicine may rarely cause 'gastrointestinal perforation' (holes in the stomach, intestines or colon), a serious side effect requiring surgery to repair. This medicine should be started at least 28 days following major surgery and the site of the surgery should be totally healed. Check with your doctor before scheduling dental work or surgery while you are receiving this treatment. Talk to your doctor if you have recently had surgery or if you have a wound that has not healed. Do not become pregnant while taking this medicine or for 3 months after stopping it. Women should inform their doctor if they wish to become pregnant or think they might be pregnant. There is a potential for serious side effects to an unborn child. Talk to your health care professional or pharmacist for more information. What side effects may I notice from receiving this medicine? Side effects that you should report to your doctor or health care professional as soon as possible: -allergic reactions like skin rash, itching or hives, breathing problems, swelling of the face, lips, or tongue -signs of infection - fever   or chills, cough, sore throat -chest pain or chest tightness -confusion -dizziness -feeling faint or lightheaded, falls -severe abdominal pain -severe nausea, vomiting -signs and symptoms of bleeding such as bloody or  black, tarry stools; red or dark-brown urine; spitting up blood or brown material that looks like coffee grounds; red spots on the skin; unusual bruising or bleeding from the eye, gums, or nose -signs and symptoms of a blood clot such as breathing problems; changes in vision; chest pain; severe, sudden headache; pain, swelling, warmth in the leg; trouble speaking; sudden numbness or weakness of the face, arm or leg -symptoms of a stroke: change in mental awareness, inability to talk or move one side of the body -trouble walking, dizziness, loss of balance or coordination Side effects that usually do not require medical attention (Report these to your doctor or health care professional if they continue or are bothersome.): -cold, clammy skin -constipation -diarrhea -headache -nausea, vomiting -stomach pain -unusually slow heartbeat -unusually weak or tired This list may not describe all possible side effects. Call your doctor for medical advice about side effects. You may report side effects to FDA at 1-800-FDA-1088. Where should I keep my medicine? This drug is given in a hospital or clinic and will not be stored at home. NOTE: This sheet is a summary. It may not cover all possible information. If you have questions about this medicine, talk to your doctor, pharmacist, or health care provider.  2015, Elsevier/Gold Standard. (2014-04-19 13:05:27) Docetaxel injection What is this medicine? DOCETAXEL (doe se TAX el) is a chemotherapy drug. It targets fast dividing cells, like cancer cells, and causes these cells to die. This medicine is used to treat many types of cancers like breast cancer, certain stomach cancers, head and neck cancer, lung cancer, and prostate cancer. This medicine may be used for other purposes; ask your health care provider or pharmacist if you have questions. COMMON BRAND NAME(S): Docefrez, Taxotere What should I tell my health care provider before I take this  medicine? They need to know if you have any of these conditions: -infection (especially a virus infection such as chickenpox, cold sores, or herpes) -liver disease -low blood counts, like low white cell, platelet, or red cell counts -an unusual or allergic reaction to docetaxel, polysorbate 80, other chemotherapy agents, other medicines, foods, dyes, or preservatives -pregnant or trying to get pregnant -breast-feeding How should I use this medicine? This drug is given as an infusion into a vein. It is administered in a hospital or clinic by a specially trained health care professional. Talk to your pediatrician regarding the use of this medicine in children. Special care may be needed. Overdosage: If you think you have taken too much of this medicine contact a poison control center or emergency room at once. NOTE: This medicine is only for you. Do not share this medicine with others. What if I miss a dose? It is important not to miss your dose. Call your doctor or health care professional if you are unable to keep an appointment. What may interact with this medicine? -cyclosporine -erythromycin -ketoconazole -medicines to increase blood counts like filgrastim, pegfilgrastim, sargramostim -vaccines Talk to your doctor or health care professional before taking any of these medicines: -acetaminophen -aspirin -ibuprofen -ketoprofen -naproxen This list may not describe all possible interactions. Give your health care provider a list of all the medicines, herbs, non-prescription drugs, or dietary supplements you use. Also tell them if you smoke, drink alcohol, or use illegal drugs. Some items   may interact with your medicine. What should I watch for while using this medicine? Your condition will be monitored carefully while you are receiving this medicine. You will need important blood work done while you are taking this medicine. This drug may make you feel generally unwell. This is not  uncommon, as chemotherapy can affect healthy cells as well as cancer cells. Report any side effects. Continue your course of treatment even though you feel ill unless your doctor tells you to stop. In some cases, you may be given additional medicines to help with side effects. Follow all directions for their use. Call your doctor or health care professional for advice if you get a fever, chills or sore throat, or other symptoms of a cold or flu. Do not treat yourself. This drug decreases your body's ability to fight infections. Try to avoid being around people who are sick. This medicine may increase your risk to bruise or bleed. Call your doctor or health care professional if you notice any unusual bleeding. Be careful brushing and flossing your teeth or using a toothpick because you may get an infection or bleed more easily. If you have any dental work done, tell your dentist you are receiving this medicine. Avoid taking products that contain aspirin, acetaminophen, ibuprofen, naproxen, or ketoprofen unless instructed by your doctor. These medicines may hide a fever. This medicine contains an alcohol in the product. You may get drowsy or dizzy. Do not drive, use machinery, or do anything that needs mental alertness until you know how this medicine affects you. Do not stand or sit up quickly, especially if you are an older patient. This reduces the risk of dizzy or fainting spells. Avoid alcoholic drinks Do not become pregnant while taking this medicine. Women should inform their doctor if they wish to become pregnant or think they might be pregnant. There is a potential for serious side effects to an unborn child. Talk to your health care professional or pharmacist for more information. Do not breast-feed an infant while taking this medicine. What side effects may I notice from receiving this medicine? Side effects that you should report to your doctor or health care professional as soon as  possible: -allergic reactions like skin rash, itching or hives, swelling of the face, lips, or tongue -low blood counts - This drug may decrease the number of white blood cells, red blood cells and platelets. You may be at increased risk for infections and bleeding. -signs of infection - fever or chills, cough, sore throat, pain or difficulty passing urine -signs of decreased platelets or bleeding - bruising, pinpoint red spots on the skin, black, tarry stools, nosebleeds -signs of decreased red blood cells - unusually weak or tired, fainting spells, lightheadedness -breathing problems -fast or irregular heartbeat -low blood pressure -mouth sores -nausea and vomiting -pain, swelling, redness or irritation at the injection site -pain, tingling, numbness in the hands or feet -swelling of the ankle, feet, hands -weight gain Side effects that usually do not require medical attention (report to your prescriber or health care professional if they continue or are bothersome): -bone pain -complete hair loss including hair on your head, underarms, pubic hair, eyebrows, and eyelashes -diarrhea -excessive tearing -changes in the color of fingernails -loosening of the fingernails -nausea -muscle pain -red flush to skin -sweating -weak or tired This list may not describe all possible side effects. Call your doctor for medical advice about side effects. You may report side effects to FDA at 1-800-FDA-1088. Where should   I keep my medicine? This drug is given in a hospital or clinic and will not be stored at home. NOTE: This sheet is a summary. It may not cover all possible information. If you have questions about this medicine, talk to your doctor, pharmacist, or health care provider.  2015, Elsevier/Gold Standard. (2013-11-04 22:21:02)  

## 2015-07-25 NOTE — Progress Notes (Signed)
Glucose 578. Dr. [name] to infusion and aware. Orders obtained for patient to receive 20 units regular insulin. No need to recheck Glucose, but Dr. [name] would like pt to consult with his primary doctor. Beverlee Nims, Patients nurse aware.

## 2015-07-25 NOTE — Patient Instructions (Signed)

## 2015-07-26 ENCOUNTER — Telehealth: Payer: Self-pay | Admitting: Family Medicine

## 2015-07-26 ENCOUNTER — Encounter: Payer: Self-pay | Admitting: Internal Medicine

## 2015-07-26 ENCOUNTER — Ambulatory Visit: Payer: Medicare Other | Admitting: Family Medicine

## 2015-07-26 ENCOUNTER — Ambulatory Visit (INDEPENDENT_AMBULATORY_CARE_PROVIDER_SITE_OTHER): Payer: Medicare Other | Admitting: Internal Medicine

## 2015-07-26 ENCOUNTER — Other Ambulatory Visit: Payer: Medicare Other

## 2015-07-26 ENCOUNTER — Telehealth: Payer: Self-pay

## 2015-07-26 VITALS — BP 144/96 | HR 64 | Temp 98.1°F | Wt 180.5 lb

## 2015-07-26 DIAGNOSIS — T380X5A Adverse effect of glucocorticoids and synthetic analogues, initial encounter: Secondary | ICD-10-CM

## 2015-07-26 DIAGNOSIS — K648 Other hemorrhoids: Secondary | ICD-10-CM

## 2015-07-26 DIAGNOSIS — K5901 Slow transit constipation: Secondary | ICD-10-CM

## 2015-07-26 DIAGNOSIS — K644 Residual hemorrhoidal skin tags: Secondary | ICD-10-CM

## 2015-07-26 DIAGNOSIS — E099 Drug or chemical induced diabetes mellitus without complications: Secondary | ICD-10-CM

## 2015-07-26 DIAGNOSIS — K921 Melena: Secondary | ICD-10-CM | POA: Diagnosis not present

## 2015-07-26 DIAGNOSIS — R739 Hyperglycemia, unspecified: Secondary | ICD-10-CM | POA: Diagnosis not present

## 2015-07-26 MED ORDER — HYDROCORTISONE ACETATE 25 MG RE SUPP: 25.0000 mg | Freq: Two times a day (BID) | RECTAL | Status: AC

## 2015-07-26 NOTE — Patient Instructions (Signed)

## 2015-07-26 NOTE — Telephone Encounter (Signed)
Patient Name: Shawn Espinoza  DOB: Sep 28, 1941    Initial Comment caller states husband's BS is 339, has medication question and he has rectal bleeding   Nurse Assessment  Nurse: Leilani Merl, RN, Heather Date/Time (Eastern Time): 07/26/2015 9:02:07 AM  Confirm and document reason for call. If symptomatic, describe symptoms. ---Caller states that her husband got chemo yesterday and he has to take steroids the day before, the day of and the day after chemo, his BS this morning is 339 before his steroid medication. She gave him his blood sugar medication this morning and she gave him his glimepiride this morning and she gave him 2 mg instead of 1 mg. He is also having some rectal bleeding from hemorrhoids. He has also been really thirsty for the last 10 days, but his blood sugar has been in the low 100's until he started the steroids on Monday. Yesterday his blood sugar was around 500 when he got to the oncology chemo office. He also stopped the gabapentin 3 days ago because he was so out of it from the medication and it was not helping his neuropathy.  Has the patient traveled out of the country within the last 30 days? ---Not Applicable  Does the patient require triage? ---Yes  Related visit to physician within the last 2 weeks? ---No  Does the PT have any chronic conditions? (i.e. diabetes, asthma, etc.) ---Yes  List chronic conditions. ---lung CA, see MR     Guidelines    Guideline Title Affirmed Question Affirmed Notes  Diabetes - High Blood Sugar [1] Blood glucose > 300 mg/dl (16.5 mmol/l) AND [2] two or more times in a row   Rectal Bleeding Taking Coumadin (warfarin) or other strong blood thinner, or known bleeding disorder (e.g., thrombocytopenia)    Final Disposition User   Go to ED Now (or PCP triage) Standifer, RN, Heather    Comments  Caller is wondering what dosage she needs to give her husband of the glimepiride, she is wondering if a prescription medication for the hemorrhoids, the  OTC medication is not working well. She is also wondering why he has been so thirsty for the last 10 days. She also wants her husbands doctor to know that he stopped taking the gabapentin 3 days ago, due to him being "out of it". That seems to have improved.  Called office at [phone] and spoke with RN and was informed to try and encourage caller to have her husband see someone at the office today instead of trying to take care of the issues over the phone. Called caller back and she states that she will try to have her husband come in to see Webb Silversmith at 1:30 pm today.   Disagree/Comply: Comply    Disagree/Comply: Comply

## 2015-07-26 NOTE — Telephone Encounter (Signed)
lvm f/u new chemo - no nausea, able to eat, drinking plenty of fluid, able to move bowel and bladder, no fever. Call if any questions or concerns.

## 2015-07-26 NOTE — Telephone Encounter (Signed)
-----   Message from Noreene Larsson, RN sent at 07/25/2015  1:28 PM EDT ----- Regarding: Chemo follow up  [facility] 1st cyramza, taxotere

## 2015-07-26 NOTE — Progress Notes (Signed)
Pre visit review using our clinic review tool, if applicable. No additional management support is needed unless otherwise documented below in the visit note. 

## 2015-07-26 NOTE — Progress Notes (Signed)
Subjective:    Patient ID: Shawn Espinoza, male    DOB: 21-May-1941, 74 y.o.   MRN: [id number]  HPI  Pt presents to the clinic today with c/o elevated blood sugars. He has been having to take steroids the day before, the day of and the day after his chemo treatments. His blood sugar has been as high as 500. He has had some increased urinary frequency but denies other s/s of hyperglycemia. He recently had his A1C checked (07/05/15). It was 5.4 % so Dr. Danise Mina stopped his Amaryl. His wife did give him Amaryl 2 mg yesterday. He is not sure what he should do about the high blood pressures. He is not sure how long he will be getting this current chemo.  He is also have a issue with hemorrhoids. He is having small amounts of bright red blood when he wipes. His constipation has been worse since he started this new chemo. He takes 1/4 dose of Mirilax and Senna S 2 tabs BID on the day of chemo and the following day. He has been using Preparation H and Tucks pads without much relief. He denies abdominal pain.  Of note, he does want me to let his PCP know that he stopped taking the Gabapentin. It was too sedating and did nothing to help his neuropathic pain.  Review of Systems      Past Medical History  Diagnosis Date  . History of diabetes mellitus, type II     resolved with diet, h/o neuropathy  . Diverticulosis of colon   . Hyperlipidemia   . Hypertension   . Fatty liver 02/29/00    abd ultrasound  . Lower back pain   . Systolic murmur 4580    2Decho - normal LV fxn, EF 55%, mild AS, biatrial enlargement  . History of tobacco use quit 1990s  . 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 Personal history of colonic adenomas 07/06/2013  . Hypertensive retinopathy of both eyes 2015    mild  . Dysrhythmia     Atrial fib (found 07/2014)  . Shortness of breath   . Pneumonia   .  GERD (gastroesophageal reflux disease)     occasional  . Arthritis   . Neuropathy   . Constipation   . 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 Diabetes mellitus without complication     Type 2  . Depression   . 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 albuterol (PROVENTIL HFA;VENTOLIN HFA) 108 (90 BASE) MCG/ACT inhaler Inhale 2 puffs into the lungs every 6 (six) hours as needed for wheezing or shortness of breath. 1 Inhaler 0  . Alum & Mag Hydroxide-Simeth (MAGIC MOUTHWASH W/LIDOCAINE) SOLN Take 5 mLs by mouth 3 (three) times daily as needed for mouth pain. 5 mL 0  . apixaban (ELIQUIS) 5 MG TABS tablet Take 1 tablet (5 mg total) by mouth 2 (two) times daily. 60 tablet 6  . cetirizine (ZYRTEC) 10 MG tablet Take 10 mg by mouth daily as needed for allergies or rhinitis.     Marland Kitchen dexamethasone (DECADRON) 4 MG tablet 2 tab po bid, the day before, day of and day after chemotherapy every 3 weeks 40 tablet 1  .  diphenoxylate-atropine (LOMOTIL) 2.5-0.025 MG per tablet Take 2 tablets by mouth 4 (four) times daily as needed for diarrhea or loose stools. 30 tablet 0  . folic acid (FOLVITE) 1 MG tablet TAKE 1 TABLET BY MOUTH EVERY DAY 30 tablet 4  . glucose blood test strip Use as instructed to check blood sugar once daily and as needed. Dx: E09.9 200 each 3  . HYDROcodone-acetaminophen (NORCO/VICODIN) 5-325 MG per tablet Take 1 tablet by mouth every 6 (six) hours as needed for moderate pain.    Marland Kitchen KLOR-CON M10 10 MEQ tablet   10  . Lancets MISC Use to check sugar once daily and as needed Dx: V67.2 **ONE TOUCH DELICA** 094 each 3  . lidocaine-prilocaine (EMLA) cream Apply 1 application topically as needed. 30 g 1  . loratadine (CLARITIN) 10 MG tablet Take 10 mg by mouth daily as needed for allergies.    . metoprolol tartrate (LOPRESSOR) 25 MG tablet Take 1 tablet (25 mg  total) by mouth 2 (two) times daily. 60 tablet 6  . Multiple Vitamins-Minerals (ICAPS) CAPS Take by mouth 2 (two) times daily.    Marland Kitchen ofloxacin (OCUFLOX) 0.3 % ophthalmic solution   0  . polyethylene glycol (MIRALAX / GLYCOLAX) packet Take 17 g by mouth daily. 1/4 dose    . torsemide (DEMADEX) 20 MG tablet Take 1 tablet (20 mg total) by mouth 2 (two) times daily. (Patient taking differently: Take 20 mg by mouth daily. ) 60 tablet 11  . gabapentin (NEURONTIN) 300 MG capsule Take 1 capsule (300 mg total) by mouth at bedtime. (Patient not taking: Reported on 07/26/2015) 90 capsule 1  . ketorolac (ACULAR) 0.5 % ophthalmic solution PLACE 1 DROP IN LEFT EYE 4 TIMES DAILY STARTING 1 WEEK PRIOR TO SURGERY  4   No current facility-administered medications for this visit.    Allergies  Allergen Reactions  . Candesartan Cilexetil     REACTION: Muscle spasms  . Diltiazem Hcl     REACTION: Dizziness  . Doxazosin Mesylate Other (See Comments)    REACTION: H/A's  . Nifedipine     REACTION: Leg swelling  . Pravastatin Other (See Comments)    Muscle aches  . Red Yeast Rice     Muscle aches  . Rosuvastatin Other (See Comments)    REACTION: questionable: severe constipation  . Sertraline Other (See Comments)    oversedation  . Simvastatin Other (See Comments)    REACTION: Muscle aches    Family History  Problem Relation Age of Onset  . Stroke Mother     multiple mini strokes  . Hypertension Mother   . Heart disease Maternal Grandfather     MI  . Stroke Paternal Grandmother   . Colon cancer Neg Hx     History   Social History  . Marital Status: Married    Spouse Name: N/A  . Number of Children: 2  . Years of Education: N/A   Occupational History  . [employer], picks up golf balls.  [employer]    Social History Main Topics  . Smoking status: Former Smoker -- 3.00 packs/day for 30 years    Types: Cigarettes    Quit date: 12/23/1988  . Smokeless tobacco: Never Used  . Alcohol Use: 12.6  oz/week    21 Glasses of wine per week     Comment: 3-4 wine a day ( 8/03- 2 beers, 2 wines, 2 brandies daily)  . Drug Use: No  . Sexual Activity: Not on file  Other Topics Concern  . Not on file   Social History Narrative   Lives with wife.   [employer], failed eye exam, quit   Activity: active at gym regularly, golfing   Diet: healthy - oatmeal, avoids white starches      Advanced directives: Full code. Wouldn't want prolonged artificial life support. Wife would be HCproxy     Constitutional: Denies fever, malaise, fatigue, headache or abrupt weight changes.  Respiratory: Denies difficulty breathing, shortness of breath, cough or sputum production.   Cardiovascular: Denies chest pain, chest tightness, palpitations or swelling in the hands or feet.  Gastrointestinal: Pt reports constipation and hemorrhoids. Denies abdominal pain, bloating, diarrhea GU: Pt reports urinary frequency. Denies urgency, pain with urination, burning sensation, blood in urine, odor or discharge.   No other specific complaints in a complete review of systems (except as listed in HPI above).  Objective:   Physical Exam   BP 144/96 mmHg  Pulse 64  Temp(Src) 98.1 F (36.7 C) (Oral)  Wt 180 lb 8 oz (81.874 kg) Wt Readings from Last 3 Encounters:  07/26/15 180 lb 8 oz (81.874 kg)  07/20/15 182 lb 8 oz (82.781 kg)  07/11/15 193 lb 14.4 oz (87.952 kg)    General: Appears his stated age,  in NAD. Skin: Warm, dry and intact. No rashes, lesions or ulcerations noted. Cardiovascular: Normal rate and rhythm. S1,S2 noted.  No murmur, rubs or gallops noted. Pulmonary/Chest: Normal effort and positive vesicular breath sounds. No respiratory distress. No wheezes, rales or ronchi noted.  Abdomen: Distended but soft, nontender. Hypoactive bowel sounds, no bruits noted. No distention or masses noted. Rectal: External hemorrhoids noted. Normal rectal tone. No internal mass noted.  BMET    Component Value  Date/Time   NA 135* 07/25/2015 1151   NA 140 04/07/2015 1039   K 4.2 07/25/2015 1151   K 3.7 04/07/2015 1039   CL 101 04/07/2015 1039   CO2 26 07/25/2015 1151   CO2 35* 04/07/2015 1039   GLUCOSE 578* 07/25/2015 1151   GLUCOSE 92 04/07/2015 1039   BUN 28.7* 07/25/2015 1151   BUN 35* 04/07/2015 1039   CREATININE 1.1 07/25/2015 1151   CREATININE 0.80 04/07/2015 1039   CREATININE 0.83 02/22/2014 1314   CALCIUM 9.4 07/25/2015 1151   CALCIUM 8.7 04/07/2015 1039   GFRNONAA 88* 08/26/2014 1246   GFRAA >90 08/26/2014 1246    Lipid Panel     Component Value Date/Time   CHOL 155 03/22/2015 0808   TRIG 131.0 03/22/2015 0808   HDL 40.50 03/22/2015 0808   CHOLHDL 4 03/22/2015 0808   VLDL 26.2 03/22/2015 0808   LDLCALC 88 03/22/2015 0808    CBC    Component Value Date/Time   WBC 3.4* 07/25/2015 1151   WBC 6.2 03/31/2015 1330   RBC 4.59 07/25/2015 1151   RBC 3.41* 03/31/2015 1330   HGB 12.5* 07/25/2015 1151   HGB 10.6* 03/31/2015 1330   HCT 38.7 07/25/2015 1151   HCT 33.8* 03/31/2015 1330   PLT 126* 07/25/2015 1151   PLT 186 03/31/2015 1330   MCV 84.3 07/25/2015 1151   MCV 99.1 03/31/2015 1330   MCH 27.2 07/25/2015 1151   MCH 31.1 03/31/2015 1330   MCHC 32.2 07/25/2015 1151   MCHC 31.4 03/31/2015 1330   RDW 16.1* 07/25/2015 1151   RDW 19.9* 03/31/2015 1330   LYMPHSABS 0.3* 07/25/2015 1151   LYMPHSABS 0.3* 08/13/2014 1222   MONOABS 0.2 07/25/2015 1151   MONOABS 0.6 08/13/2014 1222  EOSABS 0.0 07/25/2015 1151   EOSABS 0.0 08/13/2014 1222   BASOSABS 0.0 07/25/2015 1151   BASOSABS 0.0 08/13/2014 1222    Hgb A1C Lab Results  Component Value Date   HGBA1C 5.4 07/05/2015        Assessment & Plan:   Hemorrhoids, secondary to constipation:  Continue Mirilax and Senna S as you have been doing eRx for Anusol suppositories to use as needed  Hyperglycemia:  Steroid induced Restart your Amaryl 2 mg daily- check sugars and be aware of hypoglycemia Follow up with  PCP in 3 months to recheck A1C  RTC in 3 months

## 2015-07-26 NOTE — Telephone Encounter (Signed)
Pt has appt with Webb Silversmith NP 07/26/15 at 1:30 pm.

## 2015-07-27 ENCOUNTER — Ambulatory Visit (HOSPITAL_BASED_OUTPATIENT_CLINIC_OR_DEPARTMENT_OTHER): Payer: Medicare Other

## 2015-07-27 VITALS — BP 156/85 | HR 71 | Temp 97.6°F

## 2015-07-27 DIAGNOSIS — C3492 Malignant neoplasm of unspecified part of left bronchus or lung: Secondary | ICD-10-CM

## 2015-07-27 MED ORDER — PEGFILGRASTIM INJECTION 6 MG/0.6ML ~~LOC~~
6.0000 mg | PREFILLED_SYRINGE | Freq: Once | SUBCUTANEOUS | Status: AC
  Administered 2015-07-27: 6 mg via SUBCUTANEOUS
  Filled 2015-07-27: qty 0.6

## 2015-07-28 ENCOUNTER — Ambulatory Visit: Payer: Medicare Other | Admitting: *Deleted

## 2015-07-28 DIAGNOSIS — E1149 Type 2 diabetes mellitus with other diabetic neurological complication: Secondary | ICD-10-CM

## 2015-07-28 NOTE — Progress Notes (Signed)
Patient ID: Shawn Espinoza, male   DOB: 08/03/1941, 74 y.o.   MRN: [id number] Patient presents for fitting of reordered diabetic shoes.  Shoes are tried on and are a great fit.  Patient left with one pair of [employer] X521M in men's 9.5 XW.  Patient has written break in instructions at home and will call if any concerns.

## 2015-07-28 NOTE — Patient Instructions (Signed)

## 2015-07-31 ENCOUNTER — Telehealth: Payer: Self-pay | Admitting: Medical Oncology

## 2015-07-31 ENCOUNTER — Ambulatory Visit (HOSPITAL_BASED_OUTPATIENT_CLINIC_OR_DEPARTMENT_OTHER): Payer: Medicare Other

## 2015-07-31 ENCOUNTER — Inpatient Hospital Stay (HOSPITAL_COMMUNITY)
Admission: EM | Admit: 2015-07-31 | Discharge: 2015-08-03 | DRG: 808 | Disposition: A | Discharge status: Home / Self Care | Payer: Medicare Other | Attending: Internal Medicine | Admitting: Internal Medicine

## 2015-07-31 ENCOUNTER — Ambulatory Visit (HOSPITAL_BASED_OUTPATIENT_CLINIC_OR_DEPARTMENT_OTHER): Payer: Medicare Other | Admitting: Nurse Practitioner

## 2015-07-31 ENCOUNTER — Other Ambulatory Visit: Payer: Self-pay | Admitting: Medical Oncology

## 2015-07-31 ENCOUNTER — Emergency Department (HOSPITAL_COMMUNITY): Payer: Medicare Other

## 2015-07-31 ENCOUNTER — Encounter (HOSPITAL_COMMUNITY): Payer: Self-pay | Admitting: *Deleted

## 2015-07-31 ENCOUNTER — Telehealth: Payer: Self-pay | Admitting: Internal Medicine

## 2015-07-31 ENCOUNTER — Other Ambulatory Visit: Payer: Self-pay

## 2015-07-31 ENCOUNTER — Ambulatory Visit: Payer: Medicare Other | Admitting: Family Medicine

## 2015-07-31 VITALS — BP 144/80 | HR 98 | Temp 98.4°F | Resp 22

## 2015-07-31 DIAGNOSIS — G9340 Encephalopathy, unspecified: Secondary | ICD-10-CM | POA: Diagnosis present

## 2015-07-31 DIAGNOSIS — N390 Urinary tract infection, site not specified: Secondary | ICD-10-CM | POA: Diagnosis present

## 2015-07-31 DIAGNOSIS — Z823 Family history of stroke: Secondary | ICD-10-CM | POA: Diagnosis not present

## 2015-07-31 DIAGNOSIS — E099 Drug or chemical induced diabetes mellitus without complications: Secondary | ICD-10-CM | POA: Diagnosis present

## 2015-07-31 DIAGNOSIS — K210 Gastro-esophageal reflux disease with esophagitis: Secondary | ICD-10-CM | POA: Diagnosis present

## 2015-07-31 DIAGNOSIS — D709 Neutropenia, unspecified: Secondary | ICD-10-CM | POA: Diagnosis not present

## 2015-07-31 DIAGNOSIS — D702 Other drug-induced agranulocytosis: Secondary | ICD-10-CM | POA: Diagnosis present

## 2015-07-31 DIAGNOSIS — T451X5A Adverse effect of antineoplastic and immunosuppressive drugs, initial encounter: Secondary | ICD-10-CM | POA: Diagnosis present

## 2015-07-31 DIAGNOSIS — I480 Paroxysmal atrial fibrillation: Secondary | ICD-10-CM | POA: Diagnosis present

## 2015-07-31 DIAGNOSIS — T380X5A Adverse effect of glucocorticoids and synthetic analogues, initial encounter: Secondary | ICD-10-CM | POA: Diagnosis present

## 2015-07-31 DIAGNOSIS — R4182 Altered mental status, unspecified: Secondary | ICD-10-CM | POA: Diagnosis not present

## 2015-07-31 DIAGNOSIS — E785 Hyperlipidemia, unspecified: Secondary | ICD-10-CM | POA: Diagnosis present

## 2015-07-31 DIAGNOSIS — K5900 Constipation, unspecified: Secondary | ICD-10-CM | POA: Diagnosis not present

## 2015-07-31 DIAGNOSIS — G629 Polyneuropathy, unspecified: Secondary | ICD-10-CM | POA: Diagnosis present

## 2015-07-31 DIAGNOSIS — C787 Secondary malignant neoplasm of liver and intrahepatic bile duct: Secondary | ICD-10-CM | POA: Diagnosis not present

## 2015-07-31 DIAGNOSIS — I10 Essential (primary) hypertension: Secondary | ICD-10-CM | POA: Diagnosis present

## 2015-07-31 DIAGNOSIS — Z7901 Long term (current) use of anticoagulants: Secondary | ICD-10-CM

## 2015-07-31 DIAGNOSIS — Z6826 Body mass index (BMI) 26.0-26.9, adult: Secondary | ICD-10-CM | POA: Diagnosis not present

## 2015-07-31 DIAGNOSIS — Z888 Allergy status to other drugs, medicaments and biological substances status: Secondary | ICD-10-CM | POA: Diagnosis not present

## 2015-07-31 DIAGNOSIS — C3492 Malignant neoplasm of unspecified part of left bronchus or lung: Secondary | ICD-10-CM | POA: Diagnosis not present

## 2015-07-31 DIAGNOSIS — I4891 Unspecified atrial fibrillation: Secondary | ICD-10-CM

## 2015-07-31 DIAGNOSIS — E46 Unspecified protein-calorie malnutrition: Secondary | ICD-10-CM | POA: Diagnosis present

## 2015-07-31 DIAGNOSIS — D61810 Antineoplastic chemotherapy induced pancytopenia: Secondary | ICD-10-CM

## 2015-07-31 DIAGNOSIS — K760 Fatty (change of) liver, not elsewhere classified: Secondary | ICD-10-CM | POA: Diagnosis present

## 2015-07-31 DIAGNOSIS — K649 Unspecified hemorrhoids: Secondary | ICD-10-CM | POA: Diagnosis present

## 2015-07-31 DIAGNOSIS — C3412 Malignant neoplasm of upper lobe, left bronchus or lung: Secondary | ICD-10-CM | POA: Diagnosis not present

## 2015-07-31 DIAGNOSIS — Z79899 Other long term (current) drug therapy: Secondary | ICD-10-CM | POA: Diagnosis not present

## 2015-07-31 DIAGNOSIS — F329 Major depressive disorder, single episode, unspecified: Secondary | ICD-10-CM | POA: Diagnosis present

## 2015-07-31 DIAGNOSIS — Z7952 Long term (current) use of systemic steroids: Secondary | ICD-10-CM

## 2015-07-31 DIAGNOSIS — I5032 Chronic diastolic (congestive) heart failure: Secondary | ICD-10-CM | POA: Diagnosis present

## 2015-07-31 DIAGNOSIS — M1990 Unspecified osteoarthritis, unspecified site: Secondary | ICD-10-CM | POA: Diagnosis present

## 2015-07-31 DIAGNOSIS — Z8249 Family history of ischemic heart disease and other diseases of the circulatory system: Secondary | ICD-10-CM | POA: Diagnosis not present

## 2015-07-31 DIAGNOSIS — Z885 Allergy status to narcotic agent status: Secondary | ICD-10-CM | POA: Diagnosis not present

## 2015-07-31 DIAGNOSIS — Z87891 Personal history of nicotine dependence: Secondary | ICD-10-CM

## 2015-07-31 DIAGNOSIS — H3530 Unspecified macular degeneration: Secondary | ICD-10-CM | POA: Diagnosis present

## 2015-07-31 DIAGNOSIS — Z85118 Personal history of other malignant neoplasm of bronchus and lung: Secondary | ICD-10-CM | POA: Diagnosis not present

## 2015-07-31 DIAGNOSIS — J90 Pleural effusion, not elsewhere classified: Secondary | ICD-10-CM | POA: Diagnosis not present

## 2015-07-31 LAB — COMPREHENSIVE METABOLIC PANEL (CC13)
ALBUMIN: 2.9 g/dL — AB (ref 3.5–5.0)
ALT: 23 U/L (ref 0–55)
AST: 16 U/L (ref 5–34)
Alkaline Phosphatase: 57 U/L (ref 40–150)
Anion Gap: 10 mEq/L (ref 3–11)
BILIRUBIN TOTAL: 1.34 mg/dL — AB (ref 0.20–1.20)
BUN: 19.8 mg/dL (ref 7.0–26.0)
CHLORIDE: 100 meq/L (ref 98–109)
CO2: 27 meq/L (ref 22–29)
Calcium: 8.6 mg/dL (ref 8.4–10.4)
Creatinine: 0.9 mg/dL (ref 0.7–1.3)
EGFR: 84 mL/min/{1.73_m2} — AB (ref >90)
GLUCOSE: 140 mg/dL (ref 70–140)
Potassium: 3.8 mEq/L (ref 3.5–5.1)
SODIUM: 136 meq/L (ref 136–145)
Total Protein: 6 g/dL — ABNORMAL LOW (ref 6.4–8.3)

## 2015-07-31 LAB — MANUAL DIFFERENTIAL
ALC: 0.3 10*3/uL — AB (ref 0.9–3.3)
ANC (CHCC MAN DIFF): 0 10*3/uL — AB (ref 1.5–6.5)
BAND NEUTROPHILS: 0 % (ref 0–10)
Basophil: 0 % (ref 0–2)
Blasts: 0 % (ref 0–0)
EOS: 0 % (ref 0–7)
LYMPH: 81 % — AB (ref 14–49)
MONO: 19 % — ABNORMAL HIGH (ref 0–14)
Metamyelocytes: 0 % (ref 0–0)
Myelocytes: 0 % (ref 0–0)
Other Cell: 0 % (ref 0–0)
PLT EST: DECREASED
PROMYELO: 0 % (ref 0–0)
SEG: 0 % — ABNORMAL LOW (ref 38–77)
Variant Lymph: 0 % (ref 0–0)
nRBC: 0 % (ref 0–0)

## 2015-07-31 LAB — URINALYSIS, ROUTINE W REFLEX MICROSCOPIC
Bilirubin Urine: NEGATIVE
Glucose, UA: NEGATIVE mg/dL
Hgb urine dipstick: NEGATIVE
KETONES UR: NEGATIVE mg/dL
LEUKOCYTES UA: NEGATIVE
NITRITE: NEGATIVE
PH: 5.5 (ref 5.0–8.0)
PROTEIN: NEGATIVE mg/dL
SPECIFIC GRAVITY, URINE: 1.013 (ref 1.005–1.030)
Urobilinogen, UA: 0.2 mg/dL (ref 0.0–1.0)

## 2015-07-31 LAB — CBC WITH DIFFERENTIAL/PLATELET
HEMATOCRIT: 38.1 % — AB (ref 38.4–49.9)
HGB: 12.5 g/dL — ABNORMAL LOW (ref 13.0–17.1)
MCH: 27.2 pg (ref 27.2–33.4)
MCHC: 32.8 g/dL (ref 32.0–36.0)
MCV: 83 fL (ref 79.3–98.0)
Platelets: 81 10*3/uL — ABNORMAL LOW (ref 140–400)
RBC: 4.59 10*6/uL (ref 4.20–5.82)
RDW: 14.6 % (ref 11.0–14.6)
WBC: 0.4 10*3/uL — AB (ref 4.0–10.3)

## 2015-07-31 LAB — GLUCOSE, CAPILLARY: GLUCOSE-CAPILLARY: 189 mg/dL — AB (ref 65–99)

## 2015-07-31 LAB — HOLD TUBE, BLOOD BANK

## 2015-07-31 LAB — I-STAT CG4 LACTIC ACID, ED
LACTIC ACID, VENOUS: 0.86 mmol/L (ref 0.5–2.0)
Lactic Acid, Venous: 1.23 mmol/L (ref 0.5–2.0)

## 2015-07-31 MED ORDER — ONDANSETRON HCL 4 MG/2ML IJ SOLN: 4.0000 mg | Freq: Four times a day (QID) | INTRAMUSCULAR | Status: DC | PRN

## 2015-07-31 MED ORDER — HYDROCORTISONE ACETATE 25 MG RE SUPP
25.0000 mg | Freq: Two times a day (BID) | RECTAL | Status: DC
  Administered 2015-07-31 – 2015-08-03 (×5): 25 mg via RECTAL
  Filled 2015-07-31 (×7): qty 1

## 2015-07-31 MED ORDER — ALBUTEROL SULFATE (2.5 MG/3ML) 0.083% IN NEBU: 2.5000 mg | INHALATION_SOLUTION | Freq: Four times a day (QID) | RESPIRATORY_TRACT | Status: DC | PRN

## 2015-07-31 MED ORDER — KETOROLAC TROMETHAMINE 0.5 % OP SOLN
1.0000 [drp] | Freq: Four times a day (QID) | OPHTHALMIC | Status: DC
  Administered 2015-07-31 – 2015-08-03 (×10): 1 [drp] via OPHTHALMIC
  Filled 2015-07-31: qty 3

## 2015-07-31 MED ORDER — APIXABAN 5 MG PO TABS
5.0000 mg | ORAL_TABLET | Freq: Two times a day (BID) | ORAL | Status: DC
  Administered 2015-07-31 – 2015-08-03 (×6): 5 mg via ORAL
  Filled 2015-07-31 (×7): qty 1

## 2015-07-31 MED ORDER — METOPROLOL TARTRATE 25 MG PO TABS
25.0000 mg | ORAL_TABLET | Freq: Two times a day (BID) | ORAL | Status: DC
  Administered 2015-07-31 – 2015-08-03 (×6): 25 mg via ORAL
  Filled 2015-07-31 (×7): qty 1

## 2015-07-31 MED ORDER — FOLIC ACID 1 MG PO TABS
1.0000 mg | ORAL_TABLET | Freq: Every day | ORAL | Status: DC
  Administered 2015-08-01 – 2015-08-03 (×3): 1 mg via ORAL
  Filled 2015-07-31 (×3): qty 1

## 2015-07-31 MED ORDER — POTASSIUM CHLORIDE IN NACL 20-0.9 MEQ/L-% IV SOLN
INTRAVENOUS | Status: AC
  Administered 2015-07-31: 23:00:00 via INTRAVENOUS
  Filled 2015-07-31 (×2): qty 1000

## 2015-07-31 MED ORDER — ACETAMINOPHEN 650 MG RE SUPP: 650.0000 mg | Freq: Four times a day (QID) | RECTAL | Status: DC | PRN

## 2015-07-31 MED ORDER — ONDANSETRON HCL 4 MG PO TABS: 4.0000 mg | ORAL_TABLET | Freq: Four times a day (QID) | ORAL | Status: DC | PRN

## 2015-07-31 MED ORDER — POLYETHYLENE GLYCOL 3350 17 G PO PACK: 8.5000 g | PACK | Freq: Every day | ORAL | Status: DC | PRN

## 2015-07-31 MED ORDER — INSULIN ASPART 100 UNIT/ML ~~LOC~~ SOLN
0.0000 [IU] | Freq: Three times a day (TID) | SUBCUTANEOUS | Status: DC
  Administered 2015-08-01: 1 [IU] via SUBCUTANEOUS
  Administered 2015-08-01: 2 [IU] via SUBCUTANEOUS
  Administered 2015-08-02: 1 [IU] via SUBCUTANEOUS

## 2015-07-31 MED ORDER — ACETAMINOPHEN 325 MG PO TABS
650.0000 mg | ORAL_TABLET | Freq: Four times a day (QID) | ORAL | Status: DC | PRN
  Administered 2015-08-01: 650 mg via ORAL
  Filled 2015-07-31: qty 2

## 2015-07-31 MED ORDER — POTASSIUM CHLORIDE CRYS ER 20 MEQ PO TBCR
20.0000 meq | EXTENDED_RELEASE_TABLET | Freq: Every day | ORAL | Status: DC
Start: 2015-08-01 — End: 2015-08-03
  Administered 2015-08-01 – 2015-08-03 (×3): 20 meq via ORAL
  Filled 2015-07-31 (×3): qty 1

## 2015-07-31 MED ORDER — TORSEMIDE 20 MG PO TABS
20.0000 mg | ORAL_TABLET | Freq: Every day | ORAL | Status: DC
  Administered 2015-08-01 – 2015-08-03 (×3): 20 mg via ORAL
  Filled 2015-07-31 (×3): qty 1

## 2015-07-31 MED ORDER — ICAPS PO CAPS: 1.0000 | ORAL_CAPSULE | Freq: Two times a day (BID) | ORAL | Status: DC

## 2015-07-31 MED ORDER — GLIMEPIRIDE 1 MG PO TABS
1.0000 mg | ORAL_TABLET | Freq: Every day | ORAL | Status: DC
  Administered 2015-08-01 – 2015-08-03 (×3): 1 mg via ORAL
  Filled 2015-07-31 (×5): qty 1

## 2015-07-31 MED ORDER — OCUVITE-LUTEIN PO CAPS
1.0000 | ORAL_CAPSULE | Freq: Two times a day (BID) | ORAL | Status: DC
  Administered 2015-07-31 – 2015-08-03 (×6): 1 via ORAL
  Filled 2015-07-31 (×7): qty 1

## 2015-07-31 MED ORDER — ALBUTEROL SULFATE HFA 108 (90 BASE) MCG/ACT IN AERS: 2.0000 | INHALATION_SPRAY | Freq: Four times a day (QID) | RESPIRATORY_TRACT | Status: DC | PRN

## 2015-07-31 MED ORDER — LIDOCAINE-PRILOCAINE 2.5-2.5 % EX CREA: 1.0000 "application " | TOPICAL_CREAM | CUTANEOUS | Status: DC | PRN

## 2015-07-31 NOTE — H&P (Signed)
[facility] History and Physical  Shawn Espinoza KGU:[id number] DOB: [birth year] DOA: 07/31/2015  Referring physician: Serita Grit, M.D. PCP: [name], MD   Chief Complaint: Change in mental status  HPI: Shawn Espinoza is a 74 y.o. male with a past medical history non-small cell carcinoma of the left lung, hyperlipidemia, hypertension type 2 diabetes, GERD, fatty liver (see rest of the past medical history below) who was brought from the [facility] by the nursing staff due to change in mental status and complaints of generalized weakness for the past 2 days. Apparently the patient had generalized weakness, chills, questionable low-grade fever and leg pain for 2 days at home.Marland Kitchen His wife called the cancer Center this morning and he was given at same-day appointment.   From the cancer center, he was referred to the emergency department. Workup has shown significant postchemotherapy neutropenia. The case was discussed by Dr. Serita Grit with Dr. Irene Limbo from medical oncology who recommended the patient to be admitted to be monitored and observed overnight.   The patient is currently in no acute distress and states that he feels better now.   Review of Systems:  Constitutional:   Positive weight loss, night sweats, Fevers, chills, fatigue.  HEENT:  No headaches, Difficulty swallowing,Tooth/dental problems,Sore throat,  No sneezing, itching, ear ache, nasal congestion, post nasal drip,  Cardio-vascular:  No chest pain, Orthopnea, PND, swelling in lower extremities, anasarca, dizziness, palpitations  GI:  No heartburn, indigestion, abdominal pain, nausea, vomiting, diarrhea, change in bowel habits, loss of appetite  Resp:  No shortness of breath with exertion or at rest. No excess mucus, no productive cough, No non-productive cough, No coughing up of blood.No change in color of mucus.No wheezing.No chest wall deformity  Skin:  no rash or lesions.  GU:  no dysuria,  change in color of urine, no urgency or frequency. No flank pain.  Musculoskeletal:   positive leg pain   Psychological: Decreased mentation.   Past Medical History  Diagnosis Date  . History of diabetes mellitus, type II     resolved with diet, h/o neuropathy  . Diverticulosis of colon   . Hyperlipidemia   . Hypertension   . Fatty liver 02/29/00    abd ultrasound  . Lower back pain   . Systolic murmur 6283    2Decho - normal LV fxn, EF 55%, mild AS, biatrial enlargement  . History of tobacco use quit 1990s  . 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 Personal history of colonic adenomas 07/06/2013  . Hypertensive retinopathy of both eyes 2015    mild  . Dysrhythmia     Atrial fib (found 07/2014)  . Shortness of breath   . Pneumonia   . GERD (gastroesophageal reflux disease)     occasional  . Arthritis   . Neuropathy   . Constipation   . 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 Diabetes mellitus without complication     Type 2  . Depression   . 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 Colonoscopy  2004  . Colonoscopy  2014    tubular adenoma x1, mod diverticulosis ([place])  . Flexible bronchoscopy  02/2014    WNL  . Video bronchoscopy  Bilateral 03/17/2014    Procedure: VIDEO BRONCHOSCOPY WITH FLUORO;  Surgeon: Tanda Rockers, MD;  Location: [facility];  Service: Cardiopulmonary;  Laterality: Bilateral;  . Chest tube insertion Left 08/26/2014    Procedure: INSERTION OF LEFT  PLEURAL DRAINAGE CATHETER;  Surgeon: Ivin Poot, MD;  Location: [facility];  Service: Thoracic;  Laterality: Left;  . Cataract extraction Right 03/2015    Dr [name]  . Portacath placement Right 03/31/2015    Procedure: INSERTION PORT-A-CATH;  Surgeon: Ivin Poot, MD;   Location: [place];  Service: Thoracic;  Laterality: Right;  . Removal of pleural drainage catheter Left 03/31/2015    Procedure: REMOVAL OF PLEURAL DRAINAGE CATHETER;  Surgeon: Ivin Poot, MD;  Location: [place];  Service: Thoracic;  Laterality: Left;   Social History:  reports that he quit smoking about 26 years ago. His smoking use included Cigarettes. He has a 90 pack-year smoking history. He has never used smokeless tobacco. He reports that he drinks about 12.6 oz of alcohol per week. He reports that he does not use illicit drugs.  Allergies  Allergen Reactions  . Candesartan Cilexetil     REACTION: Muscle spasms  . Diltiazem Hcl     REACTION: Dizziness  . Doxazosin Mesylate Other (See Comments)    REACTION: H/A's  . Nifedipine     REACTION: Leg swelling  . Pravastatin Other (See Comments)    Muscle aches  . Red Yeast Rice     Muscle aches  . Rosuvastatin Other (See Comments)    REACTION: questionable: severe constipation  . Sertraline Other (See Comments)    oversedation  . Simvastatin Other (See Comments)    REACTION: Muscle aches  . Hydrocodone Other (See Comments)    constipation    Family History  Problem Relation Age of Onset  . Stroke Mother     multiple mini strokes  . Hypertension Mother   . Heart disease Maternal Grandfather     MI  . Stroke Paternal Grandmother   . Colon cancer Neg Hx     Prior to Admission medications   Medication Sig Start Date End Date Taking? Authorizing Provider  albuterol (PROVENTIL HFA;VENTOLIN HFA) 108 (90 BASE) MCG/ACT inhaler Inhale 2 puffs into the lungs every 6 (six) hours as needed for wheezing or shortness of breath. 12/07/14  Yes Carlton Adam, PA-C  Alum & Mag Hydroxide-Simeth (MAGIC MOUTHWASH W/LIDOCAINE) SOLN Take 5 mLs by mouth 3 (three) times daily as needed for mouth pain. Patient taking differently: Take 5 mLs by mouth 3 (three) times daily as needed for mouth pain (sores from chemo).  03/03/15  Yes Curt Bears,  MD  apixaban (ELIQUIS) 5 MG TABS tablet Take 1 tablet (5 mg total) by mouth 2 (two) times daily. 01/23/15  Yes [name], MD  cetirizine (ZYRTEC) 10 MG tablet Take 10 mg by mouth daily as needed for allergies or rhinitis.    Yes Historical Provider, MD  dexamethasone (DECADRON) 4 MG tablet 2 tab po bid, the day before, day of and day after chemotherapy every 3 weeks 07/11/15  Yes Curt Bears, MD  diphenoxylate-atropine (LOMOTIL) 2.5-0.025 MG per tablet Take 2 tablets by mouth 4 (four) times daily as needed for diarrhea or loose stools. 01/17/15  Yes Susanne Borders, NP  folic acid (FOLVITE) 1 MG tablet TAKE 1 TABLET BY MOUTH EVERY DAY 07/25/15  Yes Curt Bears, MD  glimepiride (AMARYL) 1 MG tablet Take 1 mg by mouth daily with breakfast.  Yes Historical Provider, MD  hydrocortisone (ANUSOL-HC) 25 MG suppository Place 1 suppository (25 mg total) rectally 2 (two) times daily. 07/26/15  Yes Jearld Fenton, NP  ketorolac (ACULAR) 0.5 % ophthalmic solution PLACE 1 DROP IN LEFT EYE 4 TIMES A DAY 06/18/15  Yes Historical Provider, MD  KLOR-CON M10 10 MEQ tablet Take 20 mEq by mouth daily.  05/02/15  Yes Historical Provider, MD  lidocaine-prilocaine (EMLA) cream Apply 1 application topically as needed. 04/04/15  Yes Adrena E Johnson, PA-C  loratadine (CLARITIN) 10 MG tablet Take 10 mg by mouth as directed. 3 days before chemo and 4 days after   Yes Historical Provider, MD  metoprolol tartrate (LOPRESSOR) 25 MG tablet Take 1 tablet (25 mg total) by mouth 2 (two) times daily. 01/27/15  Yes [name], MD  Multiple Vitamins-Minerals (ICAPS) CAPS Take 1 capsule by mouth 2 (two) times daily.    Yes Historical Provider, MD  polyethylene glycol (MIRALAX / GLYCOLAX) packet Take 8.5 g by mouth daily as needed for moderate constipation.    Yes Historical Provider, MD  torsemide (DEMADEX) 20 MG tablet Take 1 tablet (20 mg total) by mouth 2 (two) times daily. Patient taking differently: Take 20 mg by mouth  daily.  05/02/15  Yes [name], MD   Physical Exam: Filed Vitals:   07/31/15 1800 07/31/15 1815 07/31/15 1830 07/31/15 1854  BP: 132/74  136/78 137/82  Pulse:   92 98  Temp:    98.2 F (36.8 C)  TempSrc:    Oral  Resp: 32 24  26  SpO2:   96% 97%    Wt Readings from Last 3 Encounters:  07/26/15 81.874 kg (180 lb 8 oz)  07/20/15 82.781 kg (182 lb 8 oz)  07/11/15 87.952 kg (193 lb 14.4 oz)    General:  Appears calm and comfortable Eyes: PERRL, normal lids, irises & conjunctiva ENT: grossly normal hearing, lips & tongue  mildly dry  Neck: no LAD, masses or thyromegaly Cardiovascular: RRR, no m/r/g. No LE edema. Telemetry: SR, no arrhythmias  Respiratory: CTA bilaterally, no w/r/r. Normal respiratory effort. Abdomen: soft, ntnd Skin: no rash or induration seen on limited exam Musculoskeletal: grossly normal tone BUE/BLE Psychiatric: grossly normal mood and affect, speech fluent and appropriate Neurologic: grossly non-focal.          Labs on Admission:  Basic Metabolic Panel:  Recent Labs Lab 07/25/15 1151 07/31/15 1407  NA 135* 136  K 4.2 3.8  CO2 26 27  GLUCOSE 578* 140  BUN 28.7* 19.8  CREATININE 1.1 0.9  CALCIUM 9.4 8.6   Liver Function Tests:  Recent Labs Lab 07/25/15 1151 07/31/15 1407  AST 15 16  ALT 22 23  ALKPHOS 67 57  BILITOT 0.50 1.34*  PROT 6.5 6.0*  ALBUMIN 3.3* 2.9*   No results for input(s): LIPASE, AMYLASE in the last 168 hours. No results for input(s): AMMONIA in the last 168 hours. CBC:  Recent Labs Lab 07/25/15 1151 07/31/15 1407  WBC 3.4* 0.4*  NEUTROABS 2.9  --   HGB 12.5* 12.5*  HCT 38.7 38.1*  MCV 84.3 83.0  PLT 126* 81*    Radiological Exams on Admission: Dg Chest 2 View  07/31/2015   CLINICAL DATA:  Weakness, altered mental status and shortness of breath today. History of metastatic lung carcinoma. Subsequent encounter.  EXAM: CHEST  2 VIEW  COMPARISON:  CT chest, abdomen and pelvis 07/07/2015. Single view of  the chest 03/31/2015.  FINDINGS: Port-A-Cath remains in place,  unchanged. Small left pleural effusion is identified as on prior studies. No consolidative process is seen. The lungs are emphysematous. Small pulmonary nodules seen on the prior CT scan are not well demonstrated on this examination. Heart size is normal. No pneumothorax.  IMPRESSION: No acute finding.  Small left pleural effusion, unchanged.  Known bilateral pulmonary nodules are not well demonstrated on plain films.   Electronically Signed   By: Inge Rise M.D.   On: 07/31/2015 15:58    EKG: Independently reviewed.  Vent. rate 92 BPM PR interval 164 ms QRS duration 92 ms QT/QTc 346/428 ms P-R-T axes 76 76 -32 Sinus rhythm Consider left atrial enlargement Borderline repolarization abnormality Baseline wander in lead(s) II III aVF   Assessment/Plan Principal Problem Neutropenia, drug-induced Active Problems:    Steroid-induced diabetes mellitus   Essential hypertension   Hemorrhoids   Adenocarcinoma of left lung, stage 4     Admit for observation. Continue IV fluids and regular home meds. Monitor temperature, blood pressure rest of the vital signs. Start IV antibiotics if fever occurs. Follow-up CBC in the morning and with oncology.   Code Status: Full Code. DVT Prophylaxis: The patient is on full dose anticoagulation. Family Communication:          Shawn Espinoza, Shawn Espinoza [phone]  [phone]   Shawn Espinoza,Shawn Espinoza Daughter [phone]  [phone]  Disposition Plan:   Time spent: Over 70 minutes.   Reubin Milan [facility] Pager [phone].

## 2015-07-31 NOTE — ED Notes (Addendum)
Pt from cancer ctr, transported by nurse, reports AMS and generalized weakness.  Went to see them today for chills, leg pain, and constipation.  Cancer ctr NP sent pt here to r/o sepsis.

## 2015-07-31 NOTE — ED Notes (Signed)
Patient transported to X-ray 

## 2015-07-31 NOTE — ED Provider Notes (Signed)
CSN: [id number]     Arrival date & time 07/31/15  1529 History   First MD Initiated Contact with Patient 07/31/15 1541     Chief Complaint  Patient presents with  . Altered Mental Status     (Consider location/radiation/quality/duration/timing/severity/associated sxs/prior Treatment) Patient is a 74 y.o. male presenting with altered mental status.  Altered Mental Status Presenting symptoms: confusion   Severity:  Moderate Most recent episode:  2 days ago Duration:  2 days Timing:  Intermittent Progression:  Waxing and waning Chronicity:  New Context comment:  History of lung cancer, getting chemo.   Associated symptoms: fever (low grade, <100.0.  )   Associated symptoms: no abdominal pain, no difficulty breathing, no nausea, no seizures and no vomiting   Associated symptoms comment:  Tremulousness/shaking in both upper extremities.  Worsening of chronic lower extremity neuropathic pain. Intermittent headaches.  Generalized weakness. Mild cough with white sputum.   Past Medical History  Diagnosis Date  . History of diabetes mellitus, type II     resolved with diet, h/o neuropathy  . Diverticulosis of colon   . Hyperlipidemia   . Hypertension   . Fatty liver 02/29/00    abd ultrasound  . Lower back pain   . Systolic murmur 5277    2Decho - normal LV fxn, EF 55%, mild AS, biatrial enlargement  . History of tobacco use quit 1990s  . 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 Personal history of colonic adenomas 07/06/2013  . Hypertensive retinopathy of both eyes 2015    mild  . Dysrhythmia     Atrial fib (found 07/2014)  . Shortness of breath   . Pneumonia   . GERD (gastroesophageal reflux disease)     occasional  . Arthritis   . Neuropathy   . Constipation   . 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 Diabetes mellitus without  complication     Type 2  . Depression   . 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 Colonoscopy  2004  . Colonoscopy  2014    tubular adenoma x1, mod diverticulosis ([place])  . Flexible bronchoscopy  02/2014    WNL  . Video bronchoscopy Bilateral 03/17/2014    Procedure: VIDEO BRONCHOSCOPY WITH FLUORO;  Surgeon: Tanda Rockers, MD;  Location: [facility];  Service: Cardiopulmonary;  Laterality: Bilateral;  . Chest tube insertion Left 08/26/2014    Procedure: INSERTION OF LEFT  PLEURAL DRAINAGE CATHETER;  Surgeon: Ivin Poot, MD;  Location: [place];  Service: Thoracic;  Laterality: Left;  . Cataract extraction Right 03/2015    Dr [name]  . Portacath placement Right 03/31/2015    Procedure: INSERTION PORT-A-CATH;  Surgeon: Ivin Poot, MD;  Location: [facility] OR;  Service: Thoracic;  Laterality: Right;  . Removal of pleural drainage catheter Left 03/31/2015    Procedure: REMOVAL OF PLEURAL DRAINAGE CATHETER;  Surgeon: Ivin Poot, MD;  Location: [facility] OR;  Service: Thoracic;  Laterality: Left;   Family History  Problem Relation Age of Onset  . Stroke Mother     multiple mini strokes  . Hypertension Mother   . Heart disease Maternal Grandfather     MI  . Stroke Paternal Grandmother   .  Colon cancer Neg Hx    History  Substance Use Topics  . Smoking status: Former Smoker -- 3.00 packs/day for 30 years    Types: Cigarettes    Quit date: 12/23/1988  . Smokeless tobacco: Never Used  . Alcohol Use: 12.6 oz/week    21 Glasses of wine per week     Comment: 3-4 wine a day ( 8/03- 2 beers, 2 wines, 2 brandies daily)    Review of Systems  Constitutional: Positive for fever (low grade, <100.0.  ).  Gastrointestinal: Negative for nausea, vomiting and abdominal pain.  Neurological: Negative for seizures.  Psychiatric/Behavioral: Positive for confusion.  All other systems reviewed and are  negative.     Allergies  Candesartan cilexetil; Diltiazem hcl; Doxazosin mesylate; Nifedipine; Pravastatin; Red yeast rice; Rosuvastatin; Sertraline; Simvastatin; and Hydrocodone  Home Medications   Prior to Admission medications   Medication Sig Start Date End Date Taking? Authorizing Provider  albuterol (PROVENTIL HFA;VENTOLIN HFA) 108 (90 BASE) MCG/ACT inhaler Inhale 2 puffs into the lungs every 6 (six) hours as needed for wheezing or shortness of breath. 12/07/14  Yes Carlton Adam, PA-C  Alum & Mag Hydroxide-Simeth (MAGIC MOUTHWASH W/LIDOCAINE) SOLN Take 5 mLs by mouth 3 (three) times daily as needed for mouth pain. Patient taking differently: Take 5 mLs by mouth 3 (three) times daily as needed for mouth pain (sores from chemo).  03/03/15  Yes Curt Bears, MD  apixaban (ELIQUIS) 5 MG TABS tablet Take 1 tablet (5 mg total) by mouth 2 (two) times daily. 01/23/15  Yes [name], MD  cetirizine (ZYRTEC) 10 MG tablet Take 10 mg by mouth daily as needed for allergies or rhinitis.    Yes Historical Provider, MD  dexamethasone (DECADRON) 4 MG tablet 2 tab po bid, the day before, day of and day after chemotherapy every 3 weeks 07/11/15  Yes Curt Bears, MD  diphenoxylate-atropine (LOMOTIL) 2.5-0.025 MG per tablet Take 2 tablets by mouth 4 (four) times daily as needed for diarrhea or loose stools. 01/17/15  Yes Susanne Borders, NP  folic acid (FOLVITE) 1 MG tablet TAKE 1 TABLET BY MOUTH EVERY DAY 07/25/15  Yes Curt Bears, MD  glimepiride (AMARYL) 1 MG tablet Take 1 mg by mouth daily with breakfast.   Yes Historical Provider, MD  hydrocortisone (ANUSOL-HC) 25 MG suppository Place 1 suppository (25 mg total) rectally 2 (two) times daily. 07/26/15  Yes Jearld Fenton, NP  ketorolac (ACULAR) 0.5 % ophthalmic solution PLACE 1 DROP IN LEFT EYE 4 TIMES A DAY 06/18/15  Yes Historical Provider, MD  KLOR-CON M10 10 MEQ tablet Take 20 mEq by mouth daily.  05/02/15  Yes Historical Provider, MD   lidocaine-prilocaine (EMLA) cream Apply 1 application topically as needed. 04/04/15  Yes Adrena E Johnson, PA-C  loratadine (CLARITIN) 10 MG tablet Take 10 mg by mouth as directed. 3 days before chemo and 4 days after   Yes Historical Provider, MD  metoprolol tartrate (LOPRESSOR) 25 MG tablet Take 1 tablet (25 mg total) by mouth 2 (two) times daily. 01/27/15  Yes [name], MD  Multiple Vitamins-Minerals (ICAPS) CAPS Take 1 capsule by mouth 2 (two) times daily.    Yes Historical Provider, MD  polyethylene glycol (MIRALAX / GLYCOLAX) packet Take 8.5 g by mouth daily as needed for moderate constipation.    Yes Historical Provider, MD  torsemide (DEMADEX) 20 MG tablet Take 1 tablet (20 mg total) by mouth 2 (two) times daily. Patient taking differently: Take 20 mg by  mouth daily.  05/02/15  Yes [name], MD   BP 141/81 mmHg  Pulse 90  Temp(Src) 98.4 F (36.9 C) (Oral)  Resp 20  SpO2 98% Physical Exam  Constitutional: He is oriented to person, place, and time. He appears well-developed and well-nourished. No distress.  HENT:  Head: Normocephalic and atraumatic.  Mouth/Throat: Oropharynx is clear and moist.  Eyes: Conjunctivae are normal. Pupils are equal, round, and reactive to light. No scleral icterus.  Neck: Neck supple.  Cardiovascular: Normal rate, regular rhythm, normal heart sounds and intact distal pulses.   No murmur heard. Pulmonary/Chest: Effort normal and breath sounds normal. No stridor. No respiratory distress. He has no wheezes. He has no rales.  Abdominal: Soft. He exhibits no distension. There is no tenderness.  Musculoskeletal: Normal range of motion. He exhibits no edema.  Neurological: He is alert and oriented to person, place, and time. Coordination normal.  No tremor or shaking.  Grossly normal strength.  Skin: Skin is warm and dry. No rash noted.  Psychiatric: He has a normal mood and affect. His behavior is normal.  Nursing note and vitals  reviewed.   ED Course  Procedures (including critical care time) Labs Review Labs Reviewed  URINALYSIS, ROUTINE W REFLEX MICROSCOPIC (NOT AT [facility]) - Abnormal; Notable for the following:    Color, Urine AMBER (*)    All other components within normal limits  CULTURE, BLOOD (ROUTINE X 2)  CULTURE, BLOOD (ROUTINE X 2)  URINE CULTURE  I-STAT CG4 LACTIC ACID, ED  I-STAT CG4 LACTIC ACID, ED    Imaging Review Dg Chest 2 View  07/31/2015   CLINICAL DATA:  Weakness, altered mental status and shortness of breath today. History of metastatic lung carcinoma. Subsequent encounter.  EXAM: CHEST  2 VIEW  COMPARISON:  CT chest, abdomen and pelvis 07/07/2015. Single view of the chest 03/31/2015.  FINDINGS: Port-A-Cath remains in place, unchanged. Small left pleural effusion is identified as on prior studies. No consolidative process is seen. The lungs are emphysematous. Small pulmonary nodules seen on the prior CT scan are not well demonstrated on this examination. Heart size is normal. No pneumothorax.  IMPRESSION: No acute finding.  Small left pleural effusion, unchanged.  Known bilateral pulmonary nodules are not well demonstrated on plain films.   Electronically Signed   By: Inge Rise M.D.   On: 07/31/2015 15:58     EKG Interpretation None      MDM   Final diagnoses:  Neutropenia    Discussed with Dr. Irene Limbo regarding symptoms over the weekend of chills, low grade fever, malaise in the setting of severe neutropenia.  He recommended observation.  Dr. Olevia Bowens to admit.      Serita Grit, MD 07/31/15 [phone]

## 2015-07-31 NOTE — Telephone Encounter (Signed)
Aware of appointment today

## 2015-07-31 NOTE — ED Notes (Signed)
Bed: WA17 Expected date:  Expected time:  Means of arrival:  Comments: [facility]

## 2015-07-31 NOTE — ED Notes (Signed)
MD at bedside. 

## 2015-07-31 NOTE — ED Notes (Signed)
Nurse in room access port

## 2015-07-31 NOTE — Telephone Encounter (Signed)
I returned call from wife. Tra "had a terrible weekend until sat - he had low grade fever, chills, legs are driving him insane painful" .  He sits in chair and "has a jerk in his hands, sometimes he answers me , sometimes he won't ". He got up this am drank miralax then went right back to bed . Wife alos reports pt is unsteady on his feet.  Per [name] appt with Cyndee today

## 2015-08-01 DIAGNOSIS — D61810 Antineoplastic chemotherapy induced pancytopenia: Secondary | ICD-10-CM

## 2015-08-01 DIAGNOSIS — D702 Other drug-induced agranulocytosis: Secondary | ICD-10-CM | POA: Diagnosis not present

## 2015-08-01 DIAGNOSIS — E46 Unspecified protein-calorie malnutrition: Secondary | ICD-10-CM

## 2015-08-01 DIAGNOSIS — I10 Essential (primary) hypertension: Secondary | ICD-10-CM | POA: Diagnosis not present

## 2015-08-01 DIAGNOSIS — C787 Secondary malignant neoplasm of liver and intrahepatic bile duct: Secondary | ICD-10-CM | POA: Diagnosis not present

## 2015-08-01 DIAGNOSIS — D709 Neutropenia, unspecified: Secondary | ICD-10-CM | POA: Diagnosis not present

## 2015-08-01 DIAGNOSIS — D696 Thrombocytopenia, unspecified: Secondary | ICD-10-CM

## 2015-08-01 DIAGNOSIS — R5383 Other fatigue: Secondary | ICD-10-CM

## 2015-08-01 DIAGNOSIS — C3412 Malignant neoplasm of upper lobe, left bronchus or lung: Secondary | ICD-10-CM | POA: Diagnosis not present

## 2015-08-01 DIAGNOSIS — C3492 Malignant neoplasm of unspecified part of left bronchus or lung: Secondary | ICD-10-CM | POA: Diagnosis not present

## 2015-08-01 DIAGNOSIS — R4182 Altered mental status, unspecified: Secondary | ICD-10-CM | POA: Diagnosis not present

## 2015-08-01 LAB — COMPREHENSIVE METABOLIC PANEL
ALBUMIN: 2.6 g/dL — AB (ref 3.5–5.0)
ALK PHOS: 45 U/L (ref 38–126)
ALT: 20 U/L (ref 17–63)
AST: 19 U/L (ref 15–41)
Anion gap: 6 (ref 5–15)
BUN: 19 mg/dL (ref 6–20)
CO2: 28 mmol/L (ref 22–32)
Calcium: 8 mg/dL — ABNORMAL LOW (ref 8.9–10.3)
Chloride: 103 mmol/L (ref 101–111)
Creatinine, Ser: 0.68 mg/dL (ref 0.61–1.24)
GFR calc Af Amer: >60 mL/min (ref >60)
GFR calc non Af Amer: >60 mL/min (ref >60)
Glucose, Bld: 152 mg/dL — ABNORMAL HIGH (ref 65–99)
Potassium: 3.8 mmol/L (ref 3.5–5.1)
SODIUM: 137 mmol/L (ref 135–145)
Total Bilirubin: 1.2 mg/dL (ref 0.3–1.2)
Total Protein: 5.4 g/dL — ABNORMAL LOW (ref 6.5–8.1)

## 2015-08-01 LAB — CBC WITH DIFFERENTIAL/PLATELET
BASOS PCT: 0 % (ref 0–1)
Basophils Absolute: 0 10*3/uL (ref 0.0–0.1)
EOS ABS: 0 10*3/uL (ref 0.0–0.7)
Eosinophils Relative: 0 % (ref 0–5)
HCT: 33.2 % — ABNORMAL LOW (ref 39.0–52.0)
Hemoglobin: 10.8 g/dL — ABNORMAL LOW (ref 13.0–17.0)
LYMPHS ABS: 0.3 10*3/uL — AB (ref 0.7–4.0)
Lymphocytes Relative: 67 % — ABNORMAL HIGH (ref 12–46)
MCH: 27.1 pg (ref 26.0–34.0)
MCHC: 32.5 g/dL (ref 30.0–36.0)
MCV: 83.2 fL (ref 78.0–100.0)
Monocytes Absolute: 0.2 10*3/uL (ref 0.1–1.0)
Monocytes Relative: 33 % — ABNORMAL HIGH (ref 3–12)
NEUTROS ABS: 0 10*3/uL — AB (ref 1.7–7.7)
Neutrophils Relative %: 0 % — ABNORMAL LOW (ref 43–77)
Platelets: 90 10*3/uL — ABNORMAL LOW (ref 150–400)
RBC: 3.99 MIL/uL — AB (ref 4.22–5.81)
RDW: 14.5 % (ref 11.5–15.5)
WBC: 0.5 10*3/uL — CL (ref 4.0–10.5)

## 2015-08-01 LAB — GLUCOSE, CAPILLARY
GLUCOSE-CAPILLARY: 137 mg/dL — AB (ref 65–99)
Glucose-Capillary: 169 mg/dL — ABNORMAL HIGH (ref 65–99)
Glucose-Capillary: 91 mg/dL (ref 65–99)
Glucose-Capillary: 128 mg/dL — ABNORMAL HIGH (ref 65–99)

## 2015-08-01 LAB — MAGNESIUM: MAGNESIUM: 1.8 mg/dL (ref 1.7–2.4)

## 2015-08-01 MED ORDER — TRAMADOL HCL 50 MG PO TABS
50.0000 mg | ORAL_TABLET | Freq: Four times a day (QID) | ORAL | Status: DC | PRN
  Administered 2015-08-01 – 2015-08-02 (×2): 50 mg via ORAL
  Filled 2015-08-01 (×2): qty 1

## 2015-08-01 MED ORDER — SENNOSIDES-DOCUSATE SODIUM 8.6-50 MG PO TABS
1.0000 | ORAL_TABLET | Freq: Two times a day (BID) | ORAL | Status: DC
  Administered 2015-08-02 – 2015-08-03 (×2): 1 via ORAL
  Filled 2015-08-01 (×5): qty 1

## 2015-08-01 MED ORDER — TBO-FILGRASTIM 480 MCG/0.8ML ~~LOC~~ SOSY
480.0000 ug | PREFILLED_SYRINGE | Freq: Every morning | SUBCUTANEOUS | Status: AC
  Administered 2015-08-01 – 2015-08-03 (×3): 480 ug via SUBCUTANEOUS
  Filled 2015-08-01 (×3): qty 0.8

## 2015-08-01 MED ORDER — SODIUM CHLORIDE 0.9 % IV SOLN
INTRAVENOUS | Status: DC
  Administered 2015-08-01 – 2015-08-02 (×2): via INTRAVENOUS

## 2015-08-01 MED ORDER — IBUPROFEN 200 MG PO TABS
400.0000 mg | ORAL_TABLET | Freq: Four times a day (QID) | ORAL | Status: DC | PRN
  Filled 2015-08-01: qty 2

## 2015-08-01 MED ORDER — LORATADINE 10 MG PO TABS
10.0000 mg | ORAL_TABLET | Freq: Every day | ORAL | Status: DC
  Administered 2015-08-01 – 2015-08-03 (×3): 10 mg via ORAL
  Filled 2015-08-01 (×3): qty 1

## 2015-08-01 NOTE — Progress Notes (Signed)
Shawn Espinoza   DOB:1941/05/10   ST#:[id number]   LGX#:[id number]  Patient Care Team: [name], MD as PCP - General (Family Medicine) Curt Bears, MD as Consulting Physician (Oncology) Monna Fam, MD as Consulting Physician (Ophthalmology) Hayden Pedro, MD as Consulting Physician (Ophthalmology)  Subjective: Shawn Espinoza is a 74 year old man with a history of Non Small Carcinoma of the left lung as detailed below, s/p C1 D1 chemo with docetaxel/cyramza on 8/2, admitted on 8/8 from the [facility] due to acute mental status changes and generalized weakness. He had low grade fever and chills and night sweats, no vision changes, no mucositis. Denies any respiratory complaints. Denies any chest pain or palpitations. Denies lower extremity swelling but did have lower extremity musculoskeletal pain due to neuropathy and recent Granix. Denies nausea, heartburn or change in bowel habits. Denies any dysuria. Denies abnormal skin rashes. Denies any bleeding issues such as epistaxis, hematemesis, hematuria or hematochezia. He has intermittent headaches. He reports feeling better since his admission, and confusion has resolved. Labs are remarkable for pancytopenia in the setting of recent chemo. CXR was negativefor acute findings. Supportive treatment was initiated.   DIAGNOSIS: Stage IIIB/IV non-small cell lung cancer, adenocarcinoma diagnosed in March of 2015.  PRIOR THERAPY:  1) Systemic chemotherapy with carboplatin for AUC of 5 and Alimta 500 mg/M2 every 3 weeks. First dose expected on 04/13/2014. Status post 6 cycles. 2) Maintenance chemotherapy with single agent Alimta 500 mg/M2 every 3 weeks. First cycle on 09/06/2014. Status post 3 cycles. 3) Second course of systemic chemotherapy with carboplatin for AUC of 5 and Alimta 500 MG/M2 every 3 weeks. First cycle 11/08/2014. Status post 6 cycles. 4)Immunotherapy with Nivolumab 3 MG/KG every 2 weeks. First dose 05/16/2015. Status  post 4 cycles.  CURRENT THERAPY:   docetaxel/cyramza D1C1 on 8/2   Scheduled Meds: . apixaban  5 mg Oral BID  . folic acid  1 mg Oral Daily  . glimepiride  1 mg Oral Q breakfast  . hydrocortisone  25 mg Rectal BID  . insulin aspart  0-9 Units Subcutaneous TID WC  . ketorolac  1 drop Left Eye QID  . metoprolol tartrate  25 mg Oral BID  . multivitamin-lutein  1 capsule Oral BID  . potassium chloride  20 mEq Oral Daily  . torsemide  20 mg Oral Daily   Continuous Infusions: . 0.9 % NaCl with KCl 20 mEq / L 75 mL/hr at 07/31/15 2234   PRN Meds:.acetaminophen **OR** acetaminophen, albuterol, lidocaine-prilocaine, ondansetron **OR** ondansetron (ZOFRAN) IV, polyethylene glycol  Objective:  Filed Vitals:   08/01/15 0500  BP: 115/71  Pulse: 105  Temp: 98.6 F (37 C)  Resp: 20     No intake or output data in the 24 hours ending 08/01/15 0749    GENERAL:alert, no distress and comfortable SKIN: skin color, texture, turgor are normal, no rashes or significant lesions EYES: normal, conjunctiva are pink and non-injected, sclera clear OROPHARYNX:no exudate, no erythema and lips, buccal mucosa, and tongue normal  NECK: supple, thyroid normal size, non-tender, without nodularity LYMPH:  no palpable lymphadenopathy in the cervical, axillary or inguinal LUNGS: clear to auscultation and percussion with normal breathing effort HEART: regular rate & rhythm and no murmurs and no lower extremity edema ABDOMEN: soft, non-tender and normal bowel sounds Musculoskeletal:no cyanosis of digits and no clubbing  PSYCH: alert & oriented x 3 with fluent speech NEURO: no focal motor/sensory deficits    CBG (last 3)   Recent Labs  07/31/15 2303  GLUCAP 189*     Labs:   Recent Labs Lab 07/25/15 1151 07/31/15 1407 08/01/15 0500  WBC 3.4* 0.4* 0.5*  HGB 12.5* 12.5* 10.8*  HCT 38.7 38.1* 33.2*  PLT 126* 81* 90*  MCV 84.3 83.0 83.2  MCH 27.2 27.2 27.1  MCHC 32.2 32.8 32.5  RDW 16.1*  14.6 14.5  LYMPHSABS 0.3*  --  0.3*  MONOABS 0.2  --  0.2  EOSABS 0.0  --  0.0  BASOSABS 0.0  --  0.0     Chemistries:    Recent Labs Lab 07/25/15 1151 07/31/15 1407 08/01/15 0500  NA 135* 136 137  K 4.2 3.8 3.8  CL  --   --  103  CO2 '26 27 28  '$ GLUCOSE 578* 140 152*  BUN 28.7* 19.8 19  CREATININE 1.1 0.9 0.68  CALCIUM 9.4 8.6 8.0*  MG  --   --  1.8  AST '15 16 19  '$ ALT '22 23 20  '$ ALKPHOS 67 57 45  BILITOT 0.50 1.34* 1.2    GFR Estimated Creatinine Clearance: 81 mL/min (by C-G formula based on Cr of 0.68).  Liver Function Tests:  Recent Labs Lab 07/25/15 1151 07/31/15 1407 08/01/15 0500  AST '15 16 19  '$ ALT '22 23 20  '$ ALKPHOS 67 57 45  BILITOT 0.50 1.34* 1.2  PROT 6.5 6.0* 5.4*  ALBUMIN 3.3* 2.9* 2.6*   Urine Studies     Component Value Date/Time   COLORURINE AMBER* 07/31/2015 1626   APPEARANCEUR CLEAR 07/31/2015 1626   LABSPEC 1.013 07/31/2015 1626   LABSPEC 1.020 10/04/2014 1059   PHURINE 5.5 07/31/2015 1626   PHURINE 6.5 10/04/2014 1059   GLUCOSEU NEGATIVE 07/31/2015 1626   GLUCOSEU Negative 10/04/2014 1059   HGBUR NEGATIVE 07/31/2015 1626   HGBUR Negative 10/04/2014 1059   BILIRUBINUR NEGATIVE 07/31/2015 1626   BILIRUBINUR Color Interference 10/04/2014 1059   KETONESUR NEGATIVE 07/31/2015 1626   KETONESUR Negative 10/04/2014 1059   PROTEINUR NEGATIVE 07/31/2015 1626   PROTEINUR 100 10/04/2014 1059   UROBILINOGEN 0.2 07/31/2015 1626   UROBILINOGEN 0.2 10/04/2014 1059   NITRITE NEGATIVE 07/31/2015 1626   NITRITE Negative 10/04/2014 1059   LEUKOCYTESUR NEGATIVE 07/31/2015 1626   LEUKOCYTESUR Negative 10/04/2014 1059    C   Imaging Studies:  Dg Chest 2 View  07/31/2015   CLINICAL DATA:  Weakness, altered mental status and shortness of breath today. History of metastatic lung carcinoma. Subsequent encounter.  EXAM: CHEST  2 VIEW  COMPARISON:  CT chest, abdomen and pelvis 07/07/2015. Single view of the chest 03/31/2015.  FINDINGS: Port-A-Cath  remains in place, unchanged. Small left pleural effusion is identified as on prior studies. No consolidative process is seen. The lungs are emphysematous. Small pulmonary nodules seen on the prior CT scan are not well demonstrated on this examination. Heart size is normal. No pneumothorax.  IMPRESSION: No acute finding.  Small left pleural effusion, unchanged.  Known bilateral pulmonary nodules are not well demonstrated on plain films.   Electronically Signed   By: Inge Rise M.D.   On: 07/31/2015 15:58    Assessment/Plan: 74 y.o.   Stage IV non-small cell lung cancer, adenocarcinoma Restaging CT evaluation 07/07/2015 shows evidence of progression in the lungs and liver.  Nivolumab was discontinued on 7/19 He is s/p C1 D1 docetaxel/cyramza on 8/2 and will resume when clinicaly stable and counts adequate.  Mental status changes Etiology unknown This is now improved Will continue to monitor, may need CT head is  this reccurs  Pancytopenia Due to recent chemotherapy on 07/25/15 Monitor counts closely  For his anemia,Transfuse blood to maintain a Hb of 8 g or if the patient is acutely bleeding  For his thrombocytopenia, Transfuse 1 unit of platelets if count is less or equal than 10,000 or 20,000 if the patient is acutely bleeding. Hold Eliquis if  platelets drop to less than 50,000 For his neutropenia, his last Granix given on 7/28. Will need Granix as ANC is 0.0, to keep it a greater than 1.0 Continue to closely monitor  Malnutrition Consider Nutrition evaluation  DVT prophylaxis On Eliquis.  Full Code   Other medical issues as per admitting team     Rondel Jumbo, PA-C 08/01/2015  7:49 AM  ADDENDUM: Hematology/Oncology Attending: The patient is seen and examined today. I agree with the above note. This is a very pleasant 74 years old white male with metastatic non-small cell lung cancer, adenocarcinoma status post several chemotherapy regimens including immunotherapy with  Nivolumab and he is currently on treatment with docetaxel and Cyramza with Neulasta injection status post 1 cycle given on 07/25/2015. The patient was admitted yesterday to [facility] with increasing fatigue and weakness as well as acute mental status change and low-grade fever and chills. His absolute neutrophil count was 0. The patient received IV hydration and he is feeling much better today. He continues to have severe neutropenia. I recommended for the patient to receive dryness 480 g subcutaneously on daily basis until his absolute neutrophil count is over 1000. Continue IV hydration. Thank you so much for taking good care of Shawn. Espinoza, I will continue to follow up the patient with you and assist in his management an as-needed basis.

## 2015-08-01 NOTE — Progress Notes (Signed)
CRITICAL VALUE ALERT  Critical value received:  WBC 0.5  Date of notification:  08/01/15  Time of notification:  0635  Critical value read back:YES  Nurse who received alert:  Lottie Dawson  MD notified (1st page):  Tylene Fantasia, NP  Time of first page:  0645  MD notified (2nd page):  Time of second page:  Responding MD:  K.KIRBY  Time MD [id number]

## 2015-08-01 NOTE — Progress Notes (Signed)
PROGRESS NOTE  Shawn Espinoza IWP:[id number] DOB: [birth year] DOA: 07/31/2015 PCP: [name], MD  HPI/Recap of past 24 hours:  Feeling better, less confused, denies pain, wife at bedside  Assessment/Plan: Principal Problem:   Neutropenia, drug-induced Active Problems:   Steroid-induced diabetes mellitus   Essential hypertension   Hemorrhoids   Adenocarcinoma of left lung, stage 4  Neutropenia: on neutropenia precaution. Low threshold to start abx if fever, G-csf injection per oncology  Pancytopenia: supportive care, no indication for transfusion.  H/o paf on eliquis, close monitor plt counts.  Confusion, improved, no focal deficit, no headache, has been on eliquis. On obvious sign of infection, lft/ lactic acid wnl. Consider imaging if worsening.  H/o diastolic chf, home meds torsemide held, currently dry, on gently hydration.  Htn: continue lopressor  Stage IV NSCLC; per oncology  Code Status: full  Family Communication: patient and wife  Disposition Plan: home when absolute neutrophil>1000   Consultants:  oncology  Procedures:  none  Antibiotics:  none   Objective: BP 109/77 mmHg  Pulse 104  Temp(Src) 99.1 F (37.3 C) (Oral)  Resp 19  Ht '5\' 9"'$  (1.753 m)  Wt 81.738 kg (180 lb 3.2 oz)  BMI 26.60 kg/m2  SpO2 98%  Intake/Output Summary (Last 24 hours) at 08/01/15 1754 Last data filed at 08/01/15 1306  Gross per 24 hour  Intake    240 ml  Output      0 ml  Net    240 ml   Filed Weights   07/31/15 2135  Weight: 81.738 kg (180 lb 3.2 oz)    Exam:   General:  NAD, no mucositis  Cardiovascular: RRR  Respiratory: diminished at bases, no obvious wheezing/rales/rhonchi  Abdomen: Soft/ND/NT, positive BS  Musculoskeletal: No Edema  Neuro: aaox4, poor short term memory, no focal findings  Skin: no rash  Data Reviewed: Basic Metabolic Panel:  Recent Labs Lab 07/31/15 1407 08/01/15 0500  NA 136 137  K 3.8 3.8  CL  --   103  CO2 27 28  GLUCOSE 140 152*  BUN 19.8 19  CREATININE 0.9 0.68  CALCIUM 8.6 8.0*  MG  --  1.8   Liver Function Tests:  Recent Labs Lab 07/31/15 1407 08/01/15 0500  AST 16 19  ALT 23 20  ALKPHOS 57 45  BILITOT 1.34* 1.2  PROT 6.0* 5.4*  ALBUMIN 2.9* 2.6*   No results for input(s): LIPASE, AMYLASE in the last 168 hours. No results for input(s): AMMONIA in the last 168 hours. CBC:  Recent Labs Lab 07/31/15 1407 08/01/15 0500  WBC 0.4* 0.5*  NEUTROABS  --  0.0*  HGB 12.5* 10.8*  HCT 38.1* 33.2*  MCV 83.0 83.2  PLT 81* 90*   Cardiac Enzymes:   No results for input(s): CKTOTAL, CKMB, CKMBINDEX, TROPONINI in the last 168 hours. BNP (last 3 results) No results for input(s): BNP in the last 8760 hours.  ProBNP (last 3 results)  Recent Labs  08/13/14 1222  PROBNP 1771.0*    CBG:  Recent Labs Lab 07/31/15 2303 08/01/15 0832 08/01/15 1144 08/01/15 1742  GLUCAP 189* 137* 169* 91    Recent Results (from the past 240 hour(s))  Culture, blood (routine x 2)     Status: None (Preliminary result)   Collection Time: 07/31/15  4:11 PM  Result Value Ref Range Status   Specimen Description BLOOD RIGHT CHEST  Final   Special Requests BOTTLES DRAWN AEROBIC AND ANAEROBIC 5 CC EACH  Final   Culture  Final    NO GROWTH < 24 HOURS Performed at [facility]    Report Status PENDING  Incomplete  Urine culture     Status: None (Preliminary result)   Collection Time: 07/31/15  4:27 PM  Result Value Ref Range Status   Specimen Description URINE, CLEAN CATCH  Final   Special Requests NONE  Final   Culture   Final    NO GROWTH < 24 HOURS Performed at [facility]    Report Status PENDING  Incomplete  Culture, blood (routine x 2)     Status: None (Preliminary result)   Collection Time: 07/31/15  8:28 PM  Result Value Ref Range Status   Specimen Description BLOOD BLOOD RIGHT HAND  Final   Special Requests BOTTLES DRAWN AEROBIC AND ANAEROBIC 5ML  Final    Culture   Final    NO GROWTH < 24 HOURS Performed at [facility]    Report Status PENDING  Incomplete     Studies: No results found.  Scheduled Meds: . apixaban  5 mg Oral BID  . folic acid  1 mg Oral Daily  . glimepiride  1 mg Oral Q breakfast  . hydrocortisone  25 mg Rectal BID  . insulin aspart  0-9 Units Subcutaneous TID WC  . ketorolac  1 drop Left Eye QID  . loratadine  10 mg Oral Daily  . metoprolol tartrate  25 mg Oral BID  . multivitamin-lutein  1 capsule Oral BID  . potassium chloride  20 mEq Oral Daily  . senna-docusate  1 tablet Oral BID  . Tbo-filgastrim (GRANIX) SQ  480 mcg Subcutaneous q morning - 10a  . torsemide  20 mg Oral Daily    Continuous Infusions: . sodium chloride 75 mL/hr at 08/01/15 1603     Time spent: 53mns  Kwesi Sangha MD, PhD  [facility] Pager [phone] If 7PM-7AM, please contact night-coverage at [email or web address], password [email or web address]/08/2015, 5:54 PM

## 2015-08-02 ENCOUNTER — Encounter: Payer: Self-pay | Admitting: Nurse Practitioner

## 2015-08-02 ENCOUNTER — Other Ambulatory Visit: Payer: Medicare Other

## 2015-08-02 DIAGNOSIS — Z79899 Other long term (current) drug therapy: Secondary | ICD-10-CM | POA: Diagnosis not present

## 2015-08-02 DIAGNOSIS — Z8249 Family history of ischemic heart disease and other diseases of the circulatory system: Secondary | ICD-10-CM | POA: Diagnosis not present

## 2015-08-02 DIAGNOSIS — I480 Paroxysmal atrial fibrillation: Secondary | ICD-10-CM | POA: Diagnosis present

## 2015-08-02 DIAGNOSIS — T380X5A Adverse effect of glucocorticoids and synthetic analogues, initial encounter: Secondary | ICD-10-CM | POA: Diagnosis not present

## 2015-08-02 DIAGNOSIS — I10 Essential (primary) hypertension: Secondary | ICD-10-CM | POA: Diagnosis not present

## 2015-08-02 DIAGNOSIS — K5900 Constipation, unspecified: Secondary | ICD-10-CM | POA: Insufficient documentation

## 2015-08-02 DIAGNOSIS — E785 Hyperlipidemia, unspecified: Secondary | ICD-10-CM | POA: Diagnosis present

## 2015-08-02 DIAGNOSIS — Z7901 Long term (current) use of anticoagulants: Secondary | ICD-10-CM | POA: Insufficient documentation

## 2015-08-02 DIAGNOSIS — K210 Gastro-esophageal reflux disease with esophagitis: Secondary | ICD-10-CM | POA: Diagnosis present

## 2015-08-02 DIAGNOSIS — I5032 Chronic diastolic (congestive) heart failure: Secondary | ICD-10-CM | POA: Diagnosis present

## 2015-08-02 DIAGNOSIS — Z6826 Body mass index (BMI) 26.0-26.9, adult: Secondary | ICD-10-CM | POA: Diagnosis not present

## 2015-08-02 DIAGNOSIS — Z888 Allergy status to other drugs, medicaments and biological substances status: Secondary | ICD-10-CM | POA: Diagnosis not present

## 2015-08-02 DIAGNOSIS — Z885 Allergy status to narcotic agent status: Secondary | ICD-10-CM | POA: Diagnosis not present

## 2015-08-02 DIAGNOSIS — K649 Unspecified hemorrhoids: Secondary | ICD-10-CM | POA: Diagnosis present

## 2015-08-02 DIAGNOSIS — E099 Drug or chemical induced diabetes mellitus without complications: Secondary | ICD-10-CM

## 2015-08-02 DIAGNOSIS — T451X5A Adverse effect of antineoplastic and immunosuppressive drugs, initial encounter: Secondary | ICD-10-CM | POA: Diagnosis present

## 2015-08-02 DIAGNOSIS — E46 Unspecified protein-calorie malnutrition: Secondary | ICD-10-CM | POA: Diagnosis present

## 2015-08-02 DIAGNOSIS — N390 Urinary tract infection, site not specified: Secondary | ICD-10-CM | POA: Diagnosis present

## 2015-08-02 DIAGNOSIS — G629 Polyneuropathy, unspecified: Secondary | ICD-10-CM | POA: Diagnosis present

## 2015-08-02 DIAGNOSIS — D702 Other drug-induced agranulocytosis: Secondary | ICD-10-CM | POA: Diagnosis not present

## 2015-08-02 DIAGNOSIS — D709 Neutropenia, unspecified: Secondary | ICD-10-CM | POA: Diagnosis not present

## 2015-08-02 DIAGNOSIS — Z87891 Personal history of nicotine dependence: Secondary | ICD-10-CM | POA: Diagnosis not present

## 2015-08-02 DIAGNOSIS — C3492 Malignant neoplasm of unspecified part of left bronchus or lung: Secondary | ICD-10-CM | POA: Diagnosis not present

## 2015-08-02 DIAGNOSIS — R4182 Altered mental status, unspecified: Secondary | ICD-10-CM | POA: Insufficient documentation

## 2015-08-02 DIAGNOSIS — K760 Fatty (change of) liver, not elsewhere classified: Secondary | ICD-10-CM | POA: Diagnosis present

## 2015-08-02 DIAGNOSIS — M1990 Unspecified osteoarthritis, unspecified site: Secondary | ICD-10-CM | POA: Diagnosis present

## 2015-08-02 DIAGNOSIS — H3530 Unspecified macular degeneration: Secondary | ICD-10-CM | POA: Diagnosis present

## 2015-08-02 DIAGNOSIS — Z823 Family history of stroke: Secondary | ICD-10-CM | POA: Diagnosis not present

## 2015-08-02 DIAGNOSIS — F329 Major depressive disorder, single episode, unspecified: Secondary | ICD-10-CM | POA: Diagnosis present

## 2015-08-02 DIAGNOSIS — G9340 Encephalopathy, unspecified: Secondary | ICD-10-CM | POA: Diagnosis present

## 2015-08-02 DIAGNOSIS — Z7952 Long term (current) use of systemic steroids: Secondary | ICD-10-CM | POA: Diagnosis not present

## 2015-08-02 LAB — CBC WITH DIFFERENTIAL/PLATELET
Basophils Absolute: 0 10*3/uL (ref 0.0–0.1)
Basophils Relative: 0 % (ref 0–1)
Eosinophils Absolute: 0 10*3/uL (ref 0.0–0.7)
Eosinophils Relative: 0 % (ref 0–5)
HCT: 34.7 % — ABNORMAL LOW (ref 39.0–52.0)
Hemoglobin: 10.7 g/dL — ABNORMAL LOW (ref 13.0–17.0)
LYMPHS PCT: 44 % (ref 12–46)
Lymphs Abs: 0.3 10*3/uL — ABNORMAL LOW (ref 0.7–4.0)
MCH: 25.7 pg — ABNORMAL LOW (ref 26.0–34.0)
MCHC: 30.8 g/dL (ref 30.0–36.0)
MCV: 83.2 fL (ref 78.0–100.0)
Monocytes Absolute: 0.2 10*3/uL (ref 0.1–1.0)
Monocytes Relative: 30 % — ABNORMAL HIGH (ref 3–12)
Neutro Abs: 0.2 10*3/uL — ABNORMAL LOW (ref 1.7–7.7)
Neutrophils Relative %: 26 % — ABNORMAL LOW (ref 43–77)
Platelets: 100 10*3/uL — ABNORMAL LOW (ref 150–400)
RBC: 4.17 MIL/uL — ABNORMAL LOW (ref 4.22–5.81)
RDW: 14.5 % (ref 11.5–15.5)
WBC: 0.7 10*3/uL — CL (ref 4.0–10.5)

## 2015-08-02 LAB — GLUCOSE, CAPILLARY
GLUCOSE-CAPILLARY: 101 mg/dL — AB (ref 65–99)
GLUCOSE-CAPILLARY: 130 mg/dL — AB (ref 65–99)
GLUCOSE-CAPILLARY: 118 mg/dL — AB (ref 65–99)
Glucose-Capillary: 90 mg/dL (ref 65–99)

## 2015-08-02 LAB — URINE CULTURE: Culture: 50000

## 2015-08-02 MED ORDER — CEFTRIAXONE SODIUM 1 G IJ SOLR
1.0000 g | Freq: Every day | INTRAMUSCULAR | Status: DC
  Filled 2015-08-02: qty 10

## 2015-08-02 MED ORDER — DEXTROSE 5 % IV SOLN
2.0000 g | Freq: Three times a day (TID) | INTRAVENOUS | Status: DC
  Administered 2015-08-02 – 2015-08-03 (×2): 2 g via INTRAVENOUS
  Filled 2015-08-02 (×3): qty 2

## 2015-08-02 MED ORDER — DM-GUAIFENESIN ER 30-600 MG PO TB12
1.0000 | ORAL_TABLET | Freq: Two times a day (BID) | ORAL | Status: DC
  Administered 2015-08-02 – 2015-08-03 (×3): 1 via ORAL
  Filled 2015-08-02 (×4): qty 1

## 2015-08-02 NOTE — Progress Notes (Addendum)
PROGRESS NOTE  Shawn Espinoza LSL:[id number] DOB: 23-Jan-1941 DOA: 07/31/2015 PCP: [name], MD  HPI: 74 y.o. 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  Subjective / 24 H Interval events - afebrile, no chest pain, shortness of breath, no abdominal pain, nausea or vomiting.    Assessment/Plan: Principal Problem:   Neutropenia, drug-induced Active Problems:   Steroid-induced diabetes mellitus   Essential hypertension   Hemorrhoids   Adenocarcinoma of left lung, stage 4   Neutropenia:  - on neutropenia precaution, very low threshold tp start antibiotics if he becomes febrile - continue G-CSF injections, I have discussed with Dr. [name] over the phone today. He recommends patient stays in house until [facility] improves further  - [place] has improved to 0.2 this morning, repeat CBC with differential tomorrow morning  Pancytopenia:  - Hb stable today - platelets stable, mild improvement - no indication for transfusion today - repeat CBC in am  H/o paroxysmal atrial fibrillation - on eliquis,  - closely monitor plt counts.  Acute encephalopathy - improved, no focal deficits no headache, has been on eliquis.  - no obvious sign of infection, lft/ lactic acid wnl.  H/o diastolic chf,  - home meds torsemide held - eating, discontinue IVF today   HTN - continue lopressor - blood pressure normal today  Stage IV NSCLC - per oncology   Diet: Diet Carb Modified Fluid consistency:: Thin; Room service appropriate?: Yes Fluids: none  DVT Prophylaxis: Eliquis  Code Status: Full Code Family Communication: no family bedside  Disposition Plan: home when ready   Consultants:  Oncology    Procedures:  None    Antibiotics  Anti-infectives    None       Studies  Dg Chest 2 View  07/31/2015   CLINICAL DATA:  Weakness, altered mental status and shortness of breath today. History of metastatic lung carcinoma. Subsequent encounter.  EXAM: CHEST  2 VIEW  COMPARISON:  CT chest, abdomen and pelvis 07/07/2015. Single view of the chest 03/31/2015.  FINDINGS: Port-A-Cath remains in place, unchanged. Small left pleural effusion is identified as on prior studies. No consolidative process is seen. The lungs are emphysematous. Small pulmonary nodules seen on the prior CT scan are not well demonstrated on this examination. Heart size is normal. No pneumothorax.  IMPRESSION: No acute finding.  Small left pleural effusion, unchanged.  Known bilateral pulmonary nodules are not well demonstrated on plain films.   Electronically Signed   By: Inge Rise M.D.   On: 07/31/2015 15:58    Objective  Filed Vitals:   08/01/15 0500 08/01/15 1306 08/01/15 2119 08/02/15 0508  BP: 115/71 109/77 98/82 110/58  Pulse: 105 104 109 100  Temp: 98.6 F (37 C) 99.1 F (37.3 C) 99.3 F (37.4 C) 98.3 F (36.8 C)  TempSrc: Oral Oral Oral Oral  Resp: '20 19 20 20  '$ Height:      Weight:      SpO2: 95% 98% 97% 100%    Intake/Output Summary (Last 24 hours) at 08/02/15 1159 Last data filed at 08/02/15 0509  Gross per 24 hour  Intake    840 ml  Output      0 ml  Net    840 ml  Filed Weights   07/31/15 2135  Weight: 81.738 kg (180 lb 3.2 oz)    Exam:  GENERAL: NAD  HEENT: Head is normocephalic and atraumatic. Extraocular muscles are intact. Pupils are equal, round, and reactive to light and accommodation. Mouth is well hydrated and without lesions. Mucous membranes are moist. Posterior pharynx clear of any exudate or lesions.  LUNGS: Clear to auscultation. No wheezing or crackles  HEART: Regular rate and rhythm without murmur. 2+ pulses, no JVD, no peripheral edema  ABDOMEN: Soft,  nontender, and nondistended. Positive bowel sounds. No hepatosplenomegaly was noted.  NEUROLOGIC: He is alert and oriented x3. Cranial nerves II through XII are grossly intact. Strength 5/5 in all 4.  SKIN: No ulceration or induration present.  Data Reviewed: Basic Metabolic Panel:  Recent Labs Lab 07/31/15 1407 08/01/15 0500  NA 136 137  K 3.8 3.8  CL  --  103  CO2 27 28  GLUCOSE 140 152*  BUN 19.8 19  CREATININE 0.9 0.68  CALCIUM 8.6 8.0*  MG  --  1.8   Liver Function Tests:  Recent Labs Lab 07/31/15 1407 08/01/15 0500  AST 16 19  ALT 23 20  ALKPHOS 57 45  BILITOT 1.34* 1.2  PROT 6.0* 5.4*  ALBUMIN 2.9* 2.6*   CBC:  Recent Labs Lab 07/31/15 1407 08/01/15 0500 08/02/15 0500  WBC 0.4* 0.5* 0.7*  NEUTROABS  --  0.0* 0.2*  HGB 12.5* 10.8* 10.7*  HCT 38.1* 33.2* 34.7*  MCV 83.0 83.2 83.2  PLT 81* 90* 100*    ProBNP (last 3 results)  Recent Labs  08/13/14 1222  PROBNP 1771.0*   CBG:  Recent Labs Lab 08/01/15 0832 08/01/15 1144 08/01/15 1742 08/01/15 2115 08/02/15 0757  GLUCAP 137* 169* 91 128* 101*    Recent Results (from the past 240 hour(s))  Culture, blood (routine x 2)     Status: None (Preliminary result)   Collection Time: 07/31/15  4:11 PM  Result Value Ref Range Status   Specimen Description BLOOD RIGHT CHEST  Final   Special Requests BOTTLES DRAWN AEROBIC AND ANAEROBIC 5 CC EACH  Final   Culture   Final    NO GROWTH < 24 HOURS Performed at [facility]    Report Status PENDING  Incomplete  Urine culture     Status: None (Preliminary result)   Collection Time: 07/31/15  4:27 PM  Result Value Ref Range Status   Specimen Description URINE, CLEAN CATCH  Final   Special Requests NONE  Final   Culture   Final    NO GROWTH < 24 HOURS Performed at [facility]    Report Status PENDING  Incomplete  Culture, blood (routine x 2)     Status: None (Preliminary result)   Collection Time: 07/31/15  8:28 PM  Result Value  Ref Range Status   Specimen Description BLOOD BLOOD RIGHT HAND  Final   Special Requests BOTTLES DRAWN AEROBIC AND ANAEROBIC 5ML  Final   Culture   Final    NO GROWTH < 24 HOURS Performed at [facility]    Report Status PENDING  Incomplete     Scheduled Meds: . apixaban  5 mg Oral BID  . dextromethorphan-guaiFENesin  1 tablet Oral BID  . folic acid  1 mg Oral Daily  . glimepiride  1 mg Oral Q breakfast  . hydrocortisone  25 mg Rectal BID  . insulin aspart  0-9 Units Subcutaneous TID WC  . ketorolac  1 drop  Left Eye QID  . loratadine  10 mg Oral Daily  . metoprolol tartrate  25 mg Oral BID  . multivitamin-lutein  1 capsule Oral BID  . potassium chloride  20 mEq Oral Daily  . senna-docusate  1 tablet Oral BID  . Tbo-filgastrim (GRANIX) SQ  480 mcg Subcutaneous q morning - 10a  . torsemide  20 mg Oral Daily   Continuous Infusions: . sodium chloride 75 mL/hr at 08/02/15 [place], MD [facility] Pager [phone]. If 7 PM - 7 AM, please contact night-coverage at [email or web address], password [facility] 08/02/2015, 11:59 AM

## 2015-08-02 NOTE — Assessment & Plan Note (Addendum)
Patient received his first cycle of Taxotere/cyramza chemotherapy on Tuesday 07/25/2015.  He received Neulasta for growth factor support on 07/27/2015.  Blood counts obtained today reveal a WBC of 0.4, ANC 0.0, hemoglobin 12.5, and platelet count down to 81.  Patient is complaining of chronic chills for the last few days; but no temperature.  He is also complaining of some altered mental status; with complaint of some mild vision changes, despite recently obtaining new glasses; chronic headache, and some dizziness.  He also had one episode of staring off into space over this past weekend that was witnessed by his wife.  Due to patient's severe neutropenia, chills, and altered level of consciousness complaints-patient will be transported to the emergency department for further evaluation and management.  Brief history report were called to emergency department charge nurse prior to transport patient to the emergency department via wheelchair per the [place].

## 2015-08-02 NOTE — Assessment & Plan Note (Signed)
Patient is complaining of chronic chills for the last few days; but no temperature.  He is also complaining of some altered mental status; with complaint of some mild vision changes, despite recently obtaining new glasses; chronic headache, and some dizziness.  He reports that he feels he is searching for his words on occasion now.  He also had one episode of staring off into space over this past weekend that was witnessed by his wife.  On exam- pt with neuro intact.    Due to patient's severe neutropenia, chills, and altered level of consciousness complaints-patient will be transported to the emergency department for further evaluation and management.  Brief history report were called to emergency department charge nurse prior to transport patient to the emergency department via wheelchair per the [place].

## 2015-08-02 NOTE — Assessment & Plan Note (Addendum)
Patient is complaining of chronic constipation; states that his hemorrhoids have flared up as well.  He continues to use stool softeners, Miralax as directed.

## 2015-08-02 NOTE — Progress Notes (Addendum)
ANTIBIOTIC CONSULT NOTE - INITIAL  Pharmacy Consult for [facility] Indication: Febrile neutropenia  Allergies  Allergen Reactions  . Candesartan Cilexetil     REACTION: Muscle spasms  . Diltiazem Hcl     REACTION: Dizziness  . Doxazosin Mesylate Other (See Comments)    REACTION: H/A's  . Nifedipine     REACTION: Leg swelling  . Pravastatin Other (See Comments)    Muscle aches  . Red Yeast Rice     Muscle aches  . Rosuvastatin Other (See Comments)    REACTION: questionable: severe constipation  . Sertraline Other (See Comments)    oversedation  . Simvastatin Other (See Comments)    REACTION: Muscle aches  . Hydrocodone Other (See Comments)    constipation    Patient Measurements: Height: '5\' 9"'$  (175.3 cm) Weight: 180 lb 6.4 oz (81.829 kg) IBW/kg (Calculated) : 70.7   Vital Signs: Temp: 100.1 F (37.8 C) (08/10 2219) Temp Source: Oral (08/10 2219) BP: 129/100 mmHg (08/10 2219) Pulse Rate: 112 (08/10 2219) Intake/Output from previous day: 08/09 0701 - 08/10 0700 In: 1080 [P.O.:1080] Out: -  Intake/Output from this shift:    Labs:  Recent Labs  07/31/15 1407 08/01/15 0500 08/02/15 0500  WBC 0.4* 0.5* 0.7*  HGB 12.5* 10.8* 10.7*  PLT 81* 90* 100*  CREATININE 0.9 0.68  --    Estimated Creatinine Clearance: 81 mL/min (by C-G formula based on Cr of 0.68). No results for input(s): VANCOTROUGH, VANCOPEAK, VANCORANDOM, GENTTROUGH, GENTPEAK, GENTRANDOM, TOBRATROUGH, TOBRAPEAK, TOBRARND, AMIKACINPEAK, AMIKACINTROU, AMIKACIN in the last 72 hours.   Microbiology: Recent Results (from the past 720 hour(s))  Culture, blood (routine x 2)     Status: None (Preliminary result)   Collection Time: 07/31/15  4:11 PM  Result Value Ref Range Status   Specimen Description BLOOD RIGHT CHEST  Final   Special Requests BOTTLES DRAWN AEROBIC AND ANAEROBIC 5 CC EACH  Final   Culture   Final    NO GROWTH 2 DAYS Performed at [facility]    Report Status PENDING  Incomplete   Urine culture     Status: None   Collection Time: 07/31/15  4:27 PM  Result Value Ref Range Status   Specimen Description URINE, CLEAN CATCH  Final   Special Requests NONE  Final   Culture   Final    50,000 COLONIES/mL GRAM POSITIVE RODS Performed at [facility]    Report Status 08/02/2015 FINAL  Final  Culture, blood (routine x 2)     Status: None (Preliminary result)   Collection Time: 07/31/15  8:28 PM  Result Value Ref Range Status   Specimen Description BLOOD BLOOD RIGHT HAND  Final   Special Requests BOTTLES DRAWN AEROBIC AND ANAEROBIC 5ML  Final   Culture   Final    NO GROWTH 2 DAYS Performed at [facility]    Report Status PENDING  Incomplete    Medical History: Past Medical History  Diagnosis Date  . History of diabetes mellitus, type II     resolved with diet, h/o neuropathy  . Diverticulosis of colon   . Hyperlipidemia   . Hypertension   . Fatty liver 02/29/00    abd ultrasound  . Lower back pain   . Systolic murmur 5573    2Decho - normal LV fxn, EF 55%, mild AS, biatrial enlargement  . History of tobacco use quit 1990s  . 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 Personal history of colonic adenomas 07/06/2013  . Hypertensive retinopathy of both eyes 2015    mild  . Dysrhythmia     Atrial fib (found 07/2014)  . Shortness of breath   . Pneumonia   . GERD (gastroesophageal reflux disease)     occasional  . Arthritis   . Neuropathy   . Constipation   . 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 Diabetes mellitus without complication     Type 2  . Depression   . Non-small cell carcinoma of left lung 02/2014    stage IIIb/IV on chemo    Medications:  Prescriptions prior to admission  Medication Sig Dispense Refill Last Dose  . albuterol (PROVENTIL HFA;VENTOLIN HFA) 108 (90 BASE) MCG/ACT inhaler  Inhale 2 puffs into the lungs every 6 (six) hours as needed for wheezing or shortness of breath. 1 Inhaler 0 Past Month at Unknown time  . Alum & Mag Hydroxide-Simeth (MAGIC MOUTHWASH W/LIDOCAINE) SOLN Take 5 mLs by mouth 3 (three) times daily as needed for mouth pain. (Patient taking differently: Take 5 mLs by mouth 3 (three) times daily as needed for mouth pain (sores from chemo). ) 5 mL 0 PRN  . apixaban (ELIQUIS) 5 MG TABS tablet Take 1 tablet (5 mg total) by mouth 2 (two) times daily. 60 tablet 6 07/31/2015 at 0800  . cetirizine (ZYRTEC) 10 MG tablet Take 10 mg by mouth daily as needed for allergies or rhinitis.    PRN  . dexamethasone (DECADRON) 4 MG tablet 2 tab po bid, the day before, day of and day after chemotherapy every 3 weeks 40 tablet 1 07/26/2015  . diphenoxylate-atropine (LOMOTIL) 2.5-0.025 MG per tablet Take 2 tablets by mouth 4 (four) times daily as needed for diarrhea or loose stools. 30 tablet 0 PRN  . folic acid (FOLVITE) 1 MG tablet TAKE 1 TABLET BY MOUTH EVERY DAY 30 tablet 4 07/31/2015 at Unknown time  . glimepiride (AMARYL) 1 MG tablet Take 1 mg by mouth daily with breakfast.   07/31/2015 at Unknown time  . hydrocortisone (ANUSOL-HC) 25 MG suppository Place 1 suppository (25 mg total) rectally 2 (two) times daily. 12 suppository 0 07/31/2015 at Unknown time  . ketorolac (ACULAR) 0.5 % ophthalmic solution PLACE 1 DROP IN LEFT EYE 4 TIMES A DAY  4 07/31/2015 at Unknown time  . KLOR-CON M10 10 MEQ tablet Take 20 mEq by mouth daily.   10 07/31/2015 at Unknown time  . lidocaine-prilocaine (EMLA) cream Apply 1 application topically as needed. 30 g 1 07/25/2015  . loratadine (CLARITIN) 10 MG tablet Take 10 mg by mouth as directed. 3 days before chemo and 4 days after   07/30/2015 at Unknown time  . metoprolol tartrate (LOPRESSOR) 25 MG tablet Take 1 tablet (25 mg total) by mouth 2 (two) times daily. 60 tablet 6 07/31/2015 at 0800  . Multiple Vitamins-Minerals (ICAPS) CAPS Take 1 capsule by mouth 2 (two)  times daily.    07/31/2015 at Unknown time  . polyethylene glycol (MIRALAX / GLYCOLAX) packet Take 8.5 g by mouth daily as needed for moderate constipation.    07/31/2015 at Unknown time  . torsemide (DEMADEX) 20 MG tablet Take 1 tablet (20 mg total) by mouth 2 (two) times daily. (Patient taking differently: Take 20 mg by mouth daily. ) 60 tablet 11 07/31/2015 at Unknown time   Scheduled:  . apixaban  5 mg Oral BID  . cefTRIAXone (ROCEPHIN)  IV  1 g Intravenous QHS  . dextromethorphan-guaiFENesin  1 tablet Oral BID  . folic acid  1 mg Oral Daily  . glimepiride  1 mg Oral Q breakfast  . hydrocortisone  25 mg Rectal BID  . insulin aspart  0-9 Units Subcutaneous TID WC  . ketorolac  1 drop Left Eye QID  . loratadine  10 mg Oral Daily  . metoprolol tartrate  25 mg Oral BID  . multivitamin-lutein  1 capsule Oral BID  . potassium chloride  20 mEq Oral Daily  . senna-docusate  1 tablet Oral BID  . Tbo-filgastrim (GRANIX) SQ  480 mcg Subcutaneous q morning - 10a  . torsemide  20 mg Oral Daily   Infusions:   Assessment: 76 yoM with hx Lung Ca admitted with generalized weakness 8/8.  Fortaz per Rx for febrile neutropenia.   Goal of Therapy:  Treat infection  Plan:   Tressie Ellis 2Gm IV q8h  F/u cultures as needed  Lawana Pai R 08/02/2015,10:29 PM

## 2015-08-02 NOTE — Progress Notes (Addendum)
Noted pt to have temp of 100.1 tonight, highest since admit. Also noted that urine culture finalized showing 50k gram positive rods. Pt currently not on any antibiotics. Paged on call NP and new orders received for antibiotics. Hortencia Conradi RN

## 2015-08-02 NOTE — Assessment & Plan Note (Signed)
Patient received his first cycle of Taxotere/cyramza chemotherapy on Tuesday 07/25/2015.  He received Neulasta for growth factor support on 07/27/2015.  Blood counts obtained today reveal a WBC of 0.4, ANC 0.0, hemoglobin 12.5, and platelet count down to 81.  Patient denies any worsening issues with either fatigue or shortness of breath.  He also denies any worsening issues with bleeding or bruising.  Due to patient's severe neutropenia, chills, and altered level of consciousness complaints-patient will be transported to the emergency department for further evaluation and management.  Brief history report were called to emergency department charge nurse prior to transport patient to the emergency department via wheelchair per the [place].

## 2015-08-02 NOTE — Assessment & Plan Note (Signed)
Pt continues to take Eliquis anticoagulant med for previous dx of a-fib.

## 2015-08-02 NOTE — Progress Notes (Signed)
SYMPTOM MANAGEMENT CLINIC   HPI: Shawn Espinoza 74 y.o. male diagnosed with lung cancer.  Currently undergoing Taxotere/Cyramza chemotherapy regimen.   Patient received his first cycle of Taxotere/cyramza chemotherapy on Tuesday 07/25/2015.  He received Neulasta for growth factor support on 07/27/2015.  Blood counts obtained today reveal a WBC of 0.4, ANC 0.0, hemoglobin 12.5, and platelet count down to 81.  Patient is complaining of chronic chills for the last few days; but no temperature.  He is also complaining of some altered mental status; with complaint of some mild vision changes, despite recently obtaining new glasses; chronic headache, and some dizziness.  He also had one episode of staring off into space over this past weekend that was witnessed by his wife.  Due to patient's severe neutropenia, chills, and altered level of consciousness complaints-patient will be transported to the emergency department for further evaluation and management.  Brief history report were called to emergency department charge nurse prior to transport patient to the emergency department via wheelchair per the [place].  HPI  ROS  Past Medical History  Diagnosis Date  . History of diabetes mellitus, type II     resolved with diet, h/o neuropathy  . Diverticulosis of colon   . Hyperlipidemia   . Hypertension   . Fatty liver 02/29/00    abd ultrasound  . Lower back pain   . Systolic murmur 6314    2Decho - normal LV fxn, EF 55%, mild AS, biatrial enlargement  . History of tobacco use quit 1990s  . 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 Personal history of colonic adenomas 07/06/2013  . Hypertensive retinopathy of both eyes 2015    mild  . Dysrhythmia     Atrial fib (found 07/2014)  . Shortness of breath   . Pneumonia   . GERD (gastroesophageal reflux disease)       occasional  . Arthritis   . Neuropathy   . Constipation   . 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 Diabetes mellitus without complication     Type 2  . Depression   . 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 Colonoscopy  2004  . Colonoscopy  2014    tubular adenoma x1, mod diverticulosis ([place])  . Flexible bronchoscopy  02/2014    WNL  . Video bronchoscopy Bilateral 03/17/2014    Procedure: VIDEO BRONCHOSCOPY WITH FLUORO;  Surgeon: Tanda Rockers, MD;  Location: [facility];  Service: Cardiopulmonary;  Laterality: Bilateral;  . Chest tube insertion Left 08/26/2014    Procedure: INSERTION OF LEFT  PLEURAL DRAINAGE CATHETER;  Surgeon: Ivin Poot, MD;  Location: [place];  Service: Thoracic;  Laterality: Left;  . Cataract extraction Right 03/2015    Dr [name]  . Portacath placement Right 03/31/2015    Procedure: INSERTION PORT-A-CATH;  Surgeon: Ivin Poot, MD;  Location: [facility];  Service: Thoracic;  Laterality: Right;  . Removal of pleural drainage catheter Left 03/31/2015    Procedure: REMOVAL OF PLEURAL DRAINAGE CATHETER;  Surgeon: Ivin Poot, MD;  Location: [place];  Service: Thoracic;  Laterality: Left;    has Steroid-induced diabetes mellitus; HLD (hyperlipidemia); History of tobacco use; Essential hypertension; Hemorrhoids; DIVERTICULOSIS,  COLON; FATTY LIVER DISEASE; Medicare annual wellness visit, subsequent; Positive hepatitis C antibody test; Systolic murmur; Lower back pain; Polycythemia; BPH (benign prostatic hypertrophy); Right knee pain; Personal history of colonic adenoma; DOE (dyspnea on exertion); Adenocarcinoma of left lung, stage 4; Liver lesion; Recurrent left pleural effusion; Skin rash; Other pancytopenia; Hypoalbuminemia; Interstitial pneumonitis; CHF (congestive heart failure); Thrombocytopenia; Peripheral edema; Protein-calorie malnutrition;  Orthostatic hypotension; Neoplastic malignant related fatigue; Atrial fibrillation, unspecified; Hypothyroid; Thrush; Hearing loss; Advanced care planning/counseling discussion; Antineoplastic chemotherapy induced pancytopenia; Neuropathic pain, leg, bilateral; Encounter for antineoplastic chemotherapy; Neutropenia; Myalgia; Neutropenia, drug-induced; Altered mental state; Constipation; and Long term current use of anticoagulant therapy on his problem list.    is allergic to candesartan cilexetil; diltiazem hcl; doxazosin mesylate; nifedipine; pravastatin; red yeast rice; rosuvastatin; sertraline; simvastatin; and hydrocodone.    Medication List       This list is accurate as of: 07/31/15  3:34 PM.  Always use your most recent med list.               albuterol 108 (90 BASE) MCG/ACT inhaler  Commonly known as:  PROVENTIL HFA;VENTOLIN HFA  Inhale 2 puffs into the lungs every 6 (six) hours as needed for wheezing or shortness of breath.     apixaban 5 MG Tabs tablet  Commonly known as:  ELIQUIS  Take 1 tablet (5 mg total) by mouth 2 (two) times daily.     cetirizine 10 MG tablet  Commonly known as:  ZYRTEC  Take 10 mg by mouth daily as needed for allergies or rhinitis.     dexamethasone 4 MG tablet  Commonly known as:  DECADRON  2 tab po bid, the day before, day of and day after chemotherapy every 3 weeks     diphenoxylate-atropine 2.5-0.025 MG per tablet  Commonly known as:  LOMOTIL  Take 2 tablets by mouth 4 (four) times daily as needed for diarrhea or loose stools.     folic acid 1 MG tablet  Commonly known as:  FOLVITE  TAKE 1 TABLET BY MOUTH EVERY DAY     glimepiride 1 MG tablet  Commonly known as:  AMARYL  Take 1 mg by mouth daily with breakfast.     glucose blood test strip  Use as instructed to check blood sugar once daily and as needed. Dx: E09.9     hydrocortisone 25 MG suppository  Commonly known as:  ANUSOL-HC  Place 1 suppository (25 mg total) rectally 2 (two)  times daily.     ICAPS Caps  Take 1 capsule by mouth 2 (two) times daily.     ketorolac 0.5 % ophthalmic solution  Commonly known as:  ACULAR  PLACE 1 DROP IN LEFT EYE 4 TIMES A DAY     KLOR-CON M10 10 MEQ tablet  Generic drug:  potassium chloride  Take 20 mEq by mouth daily.     Lancets Misc  Use to check sugar once daily and as needed Dx: A91.9 **ONE TOUCH DELICA**     lidocaine-prilocaine cream  Commonly known as:  EMLA  Apply 1 application topically as needed.     loratadine 10 MG tablet  Commonly known as:  CLARITIN  Take 10 mg by mouth as directed. 3 days before chemo and 4 days after     magic mouthwash w/lidocaine Soln  Take 5 mLs by mouth 3 (three) times daily as needed for mouth pain.     metoprolol tartrate 25 MG tablet  Commonly known as:  LOPRESSOR  Take  1 tablet (25 mg total) by mouth 2 (two) times daily.     polyethylene glycol packet  Commonly known as:  MIRALAX / GLYCOLAX  Take 8.5 g by mouth daily as needed for moderate constipation.     torsemide 20 MG tablet  Commonly known as:  DEMADEX  Take 1 tablet (20 mg total) by mouth 2 (two) times daily.         PHYSICAL EXAMINATION  Oncology Vitals 08/02/2015 08/01/2015 08/01/2015 08/01/2015 07/31/2015 07/31/2015 07/31/2015  Height - - - - 175 cm - -  Weight - - - - 81.738 kg - -  Weight (lbs) - - - - 180 lbs 3 oz - -  BMI (kg/m2) - - - - 26.61 kg/m2 - -  Temp 98.3 99.3 99.1 98.6 99.8 98.2 -  Pulse 100 109 104 105 52 98 92  Resp _0 -  SpO2 100 97 98 95 96 97 96  BSA (m2) - - - - 1.99 m2 - -   BP Readings from Last 3 Encounters:  08/02/15 110/58  07/31/15 144/80  07/27/15 156/85    Physical Exam  Constitutional: He is oriented to person, place, and time.  Patient appears fatigued, mildly weak, pale, and chronically ill.  HENT:  Head: Normocephalic and atraumatic.  Mouth/Throat: Oropharynx is clear and moist.  Eyes: Conjunctivae and EOM are normal. Pupils are equal, round, and reactive to  light. Right eye exhibits no discharge. Left eye exhibits no discharge. No scleral icterus.  Neck: Normal range of motion. Neck supple. No JVD present. No tracheal deviation present. No thyromegaly present.  Cardiovascular: Normal rate, regular rhythm, normal heart sounds and intact distal pulses.   Pulmonary/Chest: Effort normal and breath sounds normal. No respiratory distress. He has no wheezes. He has no rales. He exhibits no tenderness.  Abdominal: Soft. Bowel sounds are normal. He exhibits no distension and no mass. There is no tenderness. There is no rebound and no guarding.  Musculoskeletal: Normal range of motion. He exhibits no edema or tenderness.  Lymphadenopathy:    He has no cervical adenopathy.  Neurological: He is alert and oriented to person, place, and time. Gait normal. GCS score is 15.  Skin: Skin is warm and dry. No rash noted. No erythema. There is pallor.  Psychiatric: Affect normal.  Nursing note and vitals reviewed.   LABORATORY DATA:. Admission on 07/31/2015  Component Date Value Ref Range Status  . Specimen Description 07/31/2015 BLOOD RIGHT CHEST   Final  . Special Requests 07/31/2015 BOTTLES DRAWN AEROBIC AND ANAEROBIC 5 CC EACH   Final  . Culture 07/31/2015    Final                   Value:NO GROWTH < 24 HOURS Performed at [facility]   . Report Status 07/31/2015 PENDING   Incomplete  . Specimen Description 07/31/2015 BLOOD BLOOD RIGHT HAND   Final  . Special Requests 07/31/2015 BOTTLES DRAWN AEROBIC AND ANAEROBIC 5ML   Final  . Culture 07/31/2015    Final                   Value:NO GROWTH < 24 HOURS Performed at [facility]   . Report Status 07/31/2015 PENDING   Incomplete  . Color, Urine 07/31/2015 AMBER* YELLOW Final   BIOCHEMICALS MAY BE AFFECTED BY COLOR  . APPearance 07/31/2015 CLEAR  CLEAR Final  . Specific Gravity, Urine 07/31/2015 1.013  1.005 - 1.030  Final  . pH 07/31/2015 5.5  5.0 - 8.0 Final  . Glucose, UA 07/31/2015  NEGATIVE  NEGATIVE mg/dL Final  . Hgb urine dipstick 07/31/2015 NEGATIVE  NEGATIVE Final  . Bilirubin Urine 07/31/2015 NEGATIVE  NEGATIVE Final  . Ketones, ur 07/31/2015 NEGATIVE  NEGATIVE mg/dL Final  . Protein, ur 07/31/2015 NEGATIVE  NEGATIVE mg/dL Final  . Urobilinogen, UA 07/31/2015 0.2  0.0 - 1.0 mg/dL Final  . Nitrite 07/31/2015 NEGATIVE  NEGATIVE Final  . Leukocytes, UA 07/31/2015 NEGATIVE  NEGATIVE Final   MICROSCOPIC NOT DONE ON URINES WITH NEGATIVE PROTEIN, BLOOD, LEUKOCYTES, NITRITE, OR GLUCOSE <1000 mg/dL.  Marland Kitchen Specimen Description 07/31/2015 URINE, CLEAN CATCH   Final  . Special Requests 07/31/2015 NONE   Final  . Culture 07/31/2015    Final                   Value:NO GROWTH < 24 HOURS Performed at [facility]   . Report Status 07/31/2015 PENDING   Incomplete  . Lactic Acid, Venous 07/31/2015 1.23  0.5 - 2.0 mmol/L Final  . Lactic Acid, Venous 07/31/2015 0.86  0.5 - 2.0 mmol/L Final  . Magnesium 08/01/2015 1.8  1.7 - 2.4 mg/dL Final  . WBC 08/01/2015 0.5* 4.0 - 10.5 K/uL Final   Comment: REPEATED TO VERIFY CRITICAL RESULT CALLED TO, READ BACK BY AND VERIFIED WITH: E. ACQUAAH RN AT 0630 ON 08.09.16 BY SHUEA   . RBC 08/01/2015 3.99* 4.22 - 5.81 MIL/uL Final  . Hemoglobin 08/01/2015 10.8* 13.0 - 17.0 g/dL Final  . HCT 08/01/2015 33.2* 39.0 - 52.0 % Final  . MCV 08/01/2015 83.2  78.0 - 100.0 fL Final  . MCH 08/01/2015 27.1  26.0 - 34.0 pg Final  . MCHC 08/01/2015 32.5  30.0 - 36.0 g/dL Final  . RDW 08/01/2015 14.5  11.5 - 15.5 % Final  . Platelets 08/01/2015 90* 150 - 400 K/uL Final   Comment: SPECIMEN CHECKED FOR CLOTS REPEATED TO VERIFY PLATELET COUNT CONFIRMED BY SMEAR   . Neutrophils Relative % 08/01/2015 0* 43 - 77 % Final  . Lymphocytes Relative 08/01/2015 67* 12 - 46 % Final  . Monocytes Relative 08/01/2015 33* 3 - 12 % Final  . Eosinophils Relative 08/01/2015 0  0 - 5 % Final  . Basophils Relative 08/01/2015 0  0 - 1 % Final  . Neutro Abs 08/01/2015  0.0* 1.7 - 7.7 K/uL Final  . Lymphs Abs 08/01/2015 0.3* 0.7 - 4.0 K/uL Final  . Monocytes Absolute 08/01/2015 0.2  0.1 - 1.0 K/uL Final  . Eosinophils Absolute 08/01/2015 0.0  0.0 - 0.7 K/uL Final  . Basophils Absolute 08/01/2015 0.0  0.0 - 0.1 K/uL Final  . WBC Morphology 08/01/2015 WHITE COUNT CONFIRMED ON SMEAR   Final  . Smear Review 08/01/2015 PLATELET COUNT CONFIRMED BY SMEAR   Final  . Sodium 08/01/2015 137  135 - 145 mmol/L Final  . Potassium 08/01/2015 3.8  3.5 - 5.1 mmol/L Final  . Chloride 08/01/2015 103  101 - 111 mmol/L Final  . CO2 08/01/2015 28  22 - 32 mmol/L Final  . Glucose, Bld 08/01/2015 152* 65 - 99 mg/dL Final  . BUN 08/01/2015 19  6 - 20 mg/dL Final  . Creatinine, Ser 08/01/2015 0.68  0.61 - 1.24 mg/dL Final  . Calcium 08/01/2015 8.0* 8.9 - 10.3 mg/dL Final  . Total Protein 08/01/2015 5.4* 6.5 - 8.1 g/dL Final  . Albumin 08/01/2015 2.6* 3.5 - 5.0 g/dL Final  .  AST 08/01/2015 19  15 - 41 U/L Final  . ALT 08/01/2015 20  17 - 63 U/L Final  . Alkaline Phosphatase 08/01/2015 45  38 - 126 U/L Final  . Total Bilirubin 08/01/2015 1.2  0.3 - 1.2 mg/dL Final  . GFR calc non Af Amer 08/01/2015 >60  >60 mL/min Final  . GFR calc Af Amer 08/01/2015 >60  >60 mL/min Final   Comment: (NOTE) The eGFR has been calculated using the CKD EPI equation. This calculation has not been validated in all clinical situations. eGFR's persistently <60 mL/min signify possible Chronic Kidney Disease.   . Anion gap 08/01/2015 6  5 - 15 Final  . Glucose-Capillary 07/31/2015 189* 65 - 99 mg/dL Final  . Comment 1 07/31/2015 Notify RN   Final  . Glucose-Capillary 08/01/2015 137* 65 - 99 mg/dL Final  . Comment 1 08/01/2015 Notify RN   Final  . Glucose-Capillary 08/01/2015 169* 65 - 99 mg/dL Final  . Comment 1 08/01/2015 Notify RN   Final  . WBC 08/02/2015 0.7* 4.0 - 10.5 K/uL Final   Comment: REPEATED TO VERIFY CRITICAL VALUE NOTED.  VALUE IS CONSISTENT WITH PREVIOUSLY REPORTED AND CALLED  VALUE.   Marland Kitchen RBC 08/02/2015 4.17* 4.22 - 5.81 MIL/uL Final  . Hemoglobin 08/02/2015 10.7* 13.0 - 17.0 g/dL Final  . HCT 08/02/2015 34.7* 39.0 - 52.0 % Final  . MCV 08/02/2015 83.2  78.0 - 100.0 fL Final  . MCH 08/02/2015 25.7* 26.0 - 34.0 pg Final  . MCHC 08/02/2015 30.8  30.0 - 36.0 g/dL Final  . RDW 08/02/2015 14.5  11.5 - 15.5 % Final  . Platelets 08/02/2015 100* 150 - 400 K/uL Final   CONSISTENT WITH PREVIOUS RESULT  . Neutrophils Relative % 08/02/2015 26* 43 - 77 % Final  . Lymphocytes Relative 08/02/2015 44  12 - 46 % Final  . Monocytes Relative 08/02/2015 30* 3 - 12 % Final  . Eosinophils Relative 08/02/2015 0  0 - 5 % Final  . Basophils Relative 08/02/2015 0  0 - 1 % Final  . Neutro Abs 08/02/2015 0.2* 1.7 - 7.7 K/uL Final  . Lymphs Abs 08/02/2015 0.3* 0.7 - 4.0 K/uL Final  . Monocytes Absolute 08/02/2015 0.2  0.1 - 1.0 K/uL Final  . Eosinophils Absolute 08/02/2015 0.0  0.0 - 0.7 K/uL Final  . Basophils Absolute 08/02/2015 0.0  0.0 - 0.1 K/uL Final  . Glucose-Capillary 08/01/2015 91  65 - 99 mg/dL Final  . Comment 1 08/01/2015 Notify RN   Final  . Glucose-Capillary 08/01/2015 128* 65 - 99 mg/dL Final  . Comment 1 08/01/2015 Notify RN   Final  . Glucose-Capillary 08/02/2015 101* 65 - 99 mg/dL Final  . Comment 1 08/02/2015 Notify RN   Final  . Comment 2 08/02/2015 Document in Chart   Final  Appointment on 07/31/2015  Component Date Value Ref Range Status  . WBC 07/31/2015 0.4* 4.0 - 10.3 10e3/uL Final  . HGB 07/31/2015 12.5* 13.0 - 17.1 g/dL Final  . HCT 07/31/2015 38.1* 38.4 - 49.9 % Final  . Platelets 07/31/2015 81* 140 - 400 10e3/uL Final  . MCV 07/31/2015 83.0  79.3 - 98.0 fL Final  . MCH 07/31/2015 27.2  27.2 - 33.4 pg Final  . MCHC 07/31/2015 32.8  32.0 - 36.0 g/dL Final  . RBC 07/31/2015 4.59  4.20 - 5.82 10e6/uL Final  . RDW 07/31/2015 14.6  11.0 - 14.6 % Final  . Sodium 07/31/2015 136  136 - 145 mEq/L Final  .  Potassium 07/31/2015 3.8  3.5 - 5.1 mEq/L Final  .  Chloride 07/31/2015 100  98 - 109 mEq/L Final  . CO2 07/31/2015 27  22 - 29 mEq/L Final  . Glucose 07/31/2015 140  70 - 140 mg/dl Final  . BUN 07/31/2015 19.8  7.0 - 26.0 mg/dL Final  . Creatinine 07/31/2015 0.9  0.7 - 1.3 mg/dL Final  . Total Bilirubin 07/31/2015 1.34* 0.20 - 1.20 mg/dL Final  . Alkaline Phosphatase 07/31/2015 57  40 - 150 U/L Final  . AST 07/31/2015 16  5 - 34 U/L Final  . ALT 07/31/2015 23  0 - 55 U/L Final  . Total Protein 07/31/2015 6.0* 6.4 - 8.3 g/dL Final  . Albumin 07/31/2015 2.9* 3.5 - 5.0 g/dL Final  . Calcium 07/31/2015 8.6  8.4 - 10.4 mg/dL Final  . Anion Gap 07/31/2015 10  3 - 11 mEq/L Final  . EGFR 07/31/2015 84* >90 ml/min/1.73 m2 Final   eGFR is calculated using the CKD-EPI Creatinine Equation (2009)  . Hold Tube, Blood Bank 07/31/2015 Blood Bank Order Cancelled   Final  . ANC ([facility] manual diff) 07/31/2015 0.0* 1.5 - 6.5 10e3/uL Final  . ALC 07/31/2015 0.3* 0.9 - 3.3 10e3/uL Final  . SEG 07/31/2015 0* 38 - 77 % Final  . Band Neutrophils 07/31/2015 0  0 - 10 % Final  . LYMPH 07/31/2015 81* 14 - 49 % Final  . MONO 07/31/2015 19* 0 - 14 % Final  . EOS 07/31/2015 0  0 - 7 % Final  . Basophil 07/31/2015 0  0 - 2 % Final  . Metamyelocytes 07/31/2015 0  0 - 0 % Final  . Myelocytes 07/31/2015 0  0 - 0 % Final  . PROMYELO 07/31/2015 0  0 - 0 % Final  . Blasts 07/31/2015 0  0 - 0 % Final  . Variant Lymph 07/31/2015 0  0 - 0 % Final  . Other Cell 07/31/2015 0  0 - 0 % Final  . nRBC 07/31/2015 0  0 - 0 % Final  . Tear Drop Cells 07/31/2015 Few  Negative Final  . Ovalocytes 07/31/2015 Moderate  Negative Final  . PLT EST 07/31/2015 Decreased  Adequate Final     RADIOGRAPHIC STUDIES: Dg Chest 2 View  07/31/2015   CLINICAL DATA:  Weakness, altered mental status and shortness of breath today. History of metastatic lung carcinoma. Subsequent encounter.  EXAM: CHEST  2 VIEW  COMPARISON:  CT chest, abdomen and pelvis 07/07/2015. Single view of the chest 03/31/2015.   FINDINGS: Port-A-Cath remains in place, unchanged. Small left pleural effusion is identified as on prior studies. No consolidative process is seen. The lungs are emphysematous. Small pulmonary nodules seen on the prior CT scan are not well demonstrated on this examination. Heart size is normal. No pneumothorax.  IMPRESSION: No acute finding.  Small left pleural effusion, unchanged.  Known bilateral pulmonary nodules are not well demonstrated on plain films.   Electronically Signed   By: Inge Rise M.D.   On: 07/31/2015 15:58    ASSESSMENT/PLAN:    Adenocarcinoma of left lung, stage 4 Patient received his first cycle of Taxotere/cyramza chemotherapy on Tuesday 07/25/2015.  He received Neulasta for growth factor support on 07/27/2015.  Blood counts obtained today reveal a WBC of 0.4, ANC 0.0, hemoglobin 12.5, and platelet count down to 81.  Patient is complaining of chronic chills for the last few days; but no temperature.  He is also complaining of some altered  mental status; with complaint of some mild vision changes, despite recently obtaining new glasses; chronic headache, and some dizziness.  He also had one episode of staring off into space over this past weekend that was witnessed by his wife.  Due to patient's severe neutropenia, chills, and altered level of consciousness complaints-patient will be transported to the emergency department for further evaluation and management.  Brief history report were called to emergency department charge nurse prior to transport patient to the emergency department via wheelchair per the [place].  Altered mental state Patient is complaining of chronic chills for the last few days; but no temperature.  He is also complaining of some altered mental status; with complaint of some mild vision changes, despite recently obtaining new glasses; chronic headache, and some dizziness.  He reports that he feels he is searching for his words on occasion  now.  He also had one episode of staring off into space over this past weekend that was witnessed by his wife.  On exam- pt with neuro intact.    Due to patient's severe neutropenia, chills, and altered level of consciousness complaints-patient will be transported to the emergency department for further evaluation and management.  Brief history report were called to emergency department charge nurse prior to transport patient to the emergency department via wheelchair per the [place].   Antineoplastic chemotherapy induced pancytopenia Patient received his first cycle of Taxotere/cyramza chemotherapy on Tuesday 07/25/2015.  He received Neulasta for growth factor support on 07/27/2015.  Blood counts obtained today reveal a WBC of 0.4, ANC 0.0, hemoglobin 12.5, and platelet count down to 81.  Patient denies any worsening issues with either fatigue or shortness of breath.  He also denies any worsening issues with bleeding or bruising.  Due to patient's severe neutropenia, chills, and altered level of consciousness complaints-patient will be transported to the emergency department for further evaluation and management.  Brief history report were called to emergency department charge nurse prior to transport patient to the emergency department via wheelchair per the [place].   Constipation Patient is complaining of chronic constipation; states that his hemorrhoids have flared up as well.  He continues to use stool softeners, Miralax as directed.  Long term current use of anticoagulant therapy Pt continues to take Eliquis anticoagulant med for previous dx of a-fib.   Patient stated understanding of all instructions; and was in agreement with this plan of care. The patient knows to call the clinic with any problems, questions or concerns.   Review/collaboration with Dr. [name] regarding all aspects of patient's visit today.   Total time spent with patient was 40 minutes;   with greater than 75 percent of that time spent in face to face counseling regarding patient's symptoms,  and coordination of care and follow up.  Disclaimer: This note was dictated with voice recognition software. Similar sounding words can inadvertently be transcribed and may not be corrected upon review.   Drue Second, NP 08/02/2015

## 2015-08-03 ENCOUNTER — Ambulatory Visit: Payer: Medicare Other | Admitting: Family Medicine

## 2015-08-03 DIAGNOSIS — C3492 Malignant neoplasm of unspecified part of left bronchus or lung: Secondary | ICD-10-CM

## 2015-08-03 LAB — CBC WITH DIFFERENTIAL/PLATELET
Basophils Absolute: 0 10*3/uL (ref 0.0–0.1)
Basophils Relative: 0 % (ref 0–1)
Eosinophils Absolute: 0 10*3/uL (ref 0.0–0.7)
Eosinophils Relative: 0 % (ref 0–5)
HCT: 33.5 % — ABNORMAL LOW (ref 39.0–52.0)
Hemoglobin: 10.8 g/dL — ABNORMAL LOW (ref 13.0–17.0)
LYMPHS PCT: 19 % (ref 12–46)
Lymphs Abs: 0.4 10*3/uL — ABNORMAL LOW (ref 0.7–4.0)
MCH: 27 pg (ref 26.0–34.0)
MCHC: 32.2 g/dL (ref 30.0–36.0)
MCV: 83.8 fL (ref 78.0–100.0)
MONOS PCT: 10 % (ref 3–12)
Monocytes Absolute: 0.2 10*3/uL (ref 0.1–1.0)
Neutro Abs: 1.6 10*3/uL — ABNORMAL LOW (ref 1.7–7.7)
Neutrophils Relative %: 71 % (ref 43–77)
Platelets: 105 10*3/uL — ABNORMAL LOW (ref 150–400)
RBC: 4 MIL/uL — ABNORMAL LOW (ref 4.22–5.81)
RDW: 14.9 % (ref 11.5–15.5)
WBC: 2.2 10*3/uL — AB (ref 4.0–10.5)

## 2015-08-03 LAB — GLUCOSE, CAPILLARY: GLUCOSE-CAPILLARY: 103 mg/dL — AB (ref 65–99)

## 2015-08-03 MED ORDER — LEVOFLOXACIN 500 MG PO TABS: 500.0000 mg | ORAL_TABLET | Freq: Every day | ORAL | Status: DC

## 2015-08-03 MED ORDER — HEPARIN SOD (PORK) LOCK FLUSH 100 UNIT/ML IV SOLN
500.0000 [IU] | Freq: Once | INTRAVENOUS | Status: DC
  Filled 2015-08-03: qty 5

## 2015-08-03 NOTE — Care Management Note (Signed)
Case Management Note  Patient Details  Name: Shawn Espinoza MRN: [id number] Date of Birth: 05-13-1941  Subjective/Objective:     74 yo admitted with Neutropenia              Action/Plan: From home with spouse  Expected Discharge Date:                  Expected Discharge Plan:  Home/Self Care  In-House Referral:     Discharge planning Services  CM Consult  Post Acute Care Choice:  NA Choice offered to:  NA  DME Arranged:    DME Agency:     HH Arranged:    [place] Agency:     Status of Service:  Completed, signed off  Medicare Important Message Given:    Date Medicare IM Given:    Medicare IM give by:    Date Additional Medicare IM Given:    Additional Medicare Important Message give by:     If discussed at [place] of Stay Meetings, dates discussed:    Additional Comments: Chart reviewed and no CM needs identified.  Lynnell Catalan, RN 08/03/2015, 11:00 AM

## 2015-08-03 NOTE — Progress Notes (Signed)
RN reviewed discharge paperwork with patient and family. All questions answered.  Paperwork and prescriptions given.   NT rolled patient down to family car with all belongings.

## 2015-08-03 NOTE — Discharge Instructions (Signed)
Follow with Dr. [name] as scheduled in a week  Please get a complete blood count and chemistry panel checked by your Primary MD at your next visit, and again as instructed by your Primary MD. Please get your medications reviewed and adjusted by your Primary MD.  Please request your Primary MD to go over all Hospital Tests and Procedure/Radiological results at the follow up, please get all Hospital records sent to your Prim MD by signing hospital release before you go home.  If you had Pneumonia of Lung problems at the Hospital: Please get a 2 view Chest X ray done in 6-8 weeks after hospital discharge or sooner if instructed by your Primary MD.  If you have Congestive Heart Failure: Please call your Cardiologist or Primary MD anytime you have any of the following symptoms:  1) 3 pound weight gain in 24 hours or 5 pounds in 1 week  2) shortness of breath, with or without a dry hacking cough  3) swelling in the hands, feet or stomach  4) if you have to sleep on extra pillows at night in order to breathe  Follow cardiac low salt diet and 1.5 lit/day fluid restriction.  If you have diabetes Accuchecks 4 times/day, Once in AM empty stomach and then before each meal. Log in all results and show them to your primary doctor at your next visit. If any glucose reading is under 80 or above 300 call your primary MD immediately.  If you have Seizure/Convulsions/Epilepsy: Please do not drive, operate heavy machinery, participate in activities at heights or participate in high speed sports until you have seen by Primary MD or a Neurologist and advised to do so again.  If you had Gastrointestinal Bleeding: Please ask your Primary MD to check a complete blood count within one week of discharge or at your next visit. Your endoscopic/colonoscopic biopsies that are pending at the time of discharge, will also need to followed by your Primary MD.  Get Medicines reviewed and adjusted. Please take all your  medications with you for your next visit with your Primary MD  Please request your Primary MD to go over all hospital tests and procedure/radiological results at the follow up, please ask your Primary MD to get all Hospital records sent to his/her office.  If you experience worsening of your admission symptoms, develop shortness of breath, life threatening emergency, suicidal or homicidal thoughts you must seek medical attention immediately by calling 911 or calling your MD immediately  if symptoms less severe.  You must read complete instructions/literature along with all the possible adverse reactions/side effects for all the Medicines you take and that have been prescribed to you. Take any new Medicines after you have completely understood and accpet all the possible adverse reactions/side effects.   Do not drive or operate heavy machinery when taking Pain medications.   Do not take more than prescribed Pain, Sleep and Anxiety Medications  Special Instructions: If you have smoked or chewed Tobacco  in the last 2 yrs please stop smoking, stop any regular Alcohol  and or any Recreational drug use.  Wear Seat belts while driving.  Please note You were cared for by a hospitalist during your hospital stay. If you have any questions about your discharge medications or the care you received while you were in the hospital after you are discharged, you can call the unit and asked to speak with the hospitalist on call if the hospitalist that took care of you is not available.  Once you are discharged, your primary care physician will handle any further medical issues. Please note that NO REFILLS for any discharge medications will be authorized once you are discharged, as it is imperative that you return to your primary care physician (or establish a relationship with a primary care physician if you do not have one) for your aftercare needs so that they can reassess your need for medications and monitor your  lab values.  You can reach the hospitalist office at phone [phone] or fax [phone]   If you do not have a primary care physician, you can call [phone] for a physician referral.  Activity: As tolerated with Full fall precautions use walker/cane & assistance as needed  Diet: regular  Disposition Home

## 2015-08-03 NOTE — Discharge Summary (Signed)
Physician Discharge Summary  Shawn Espinoza EXB:[id number] DOB: Jul 09, 1941 DOA: 07/31/2015  PCP: [name], MD  Admit date: 07/31/2015 Discharge date: 08/03/2015  Time spent: > 35 minutes  Recommendations for Outpatient Follow-up:  1. [place] up with Dr. [name] in 5 days as scheduled  Discharge Diagnoses:  Principal Problem:   Neutropenia, drug-induced Active Problems:   Steroid-induced diabetes mellitus   Essential hypertension   Hemorrhoids   Adenocarcinoma of left lung, stage 4  Discharge Condition: stable  Diet recommendation: regular  Filed Weights   07/31/15 2135 08/02/15 1200  Weight: 81.738 kg (180 lb 3.2 oz) 81.829 kg (180 lb 6.4 oz)   History of present illness:  Shawn Espinoza is a 74 y.o. 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 Apparently the patient had generalized weakness, chills, questionable low-grade fever and leg pain for 2 days at home.Marland Kitchen His wife called the cancer Center this morning and he was given at same-day appointment. From the cancer center, he was referred to the emergency department. Workup has shown significant postchemotherapy neutropenia. The case was discussed by Dr. Serita Grit with Dr. Irene Limbo from medical oncology who recommended the patient to be admitted to be monitored and observed overnight. The patient is currently in no acute distress and states that he feels better now.  Hospital Course:  Neutropenia - patient was admitted to the hospital given neutropenia, his fever curve has remained stable, he had a low grade temp to 37.8, which prompted night coverage to start empiric Fortaz. 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 Patient is asymptomatic, he has remained afebrile, he will be discharged home in stable condition with 5 additional days of empiric Levofloxacin for presumed UTI. He was followed by oncology while hospitalized and has received Granix x 3. His ANC improved to 1.6K on discharge, I have discussed his care with Dr. [name] and he will continue close outpatient follow up.  Pancytopenia: - WBC improving with improvement in his ANC, Hb and platelets have remained stable. Outpatient follow up.  H/o paroxysmal atrial fibrillation- on eliquis,  Acute encephalopathy - improved, no focal deficits no headache, this has resolved shortly after admission.  H/o diastolic chf, - euvolemic, continue home medications HTN - continue lopressor Stage IV NSCLC - per oncology, outpatient follow up.  Procedures:  None    Consultations:  Oncology   Discharge Exam: Filed Vitals:   08/02/15 1200 08/02/15 1400 08/02/15 2219 08/03/15 0545  BP:  120/62 129/100 124/82  Pulse:  98 112 105  Temp:  98.1 F (36.7 C) 100.1 F (37.8 C) 98.6 F (37 C)  TempSrc:  Oral Oral Oral  Resp:  '20 18 16  '$ Height:      Weight: 81.829 kg (180 lb 6.4 oz)     SpO2:  100% 95% 98%   General: NAD Cardiovascular: RRR Respiratory: CTA biL  Discharge Instructions    Medication List    TAKE these medications        albuterol 108 (90 BASE) MCG/ACT inhaler  Commonly known as:  PROVENTIL HFA;VENTOLIN HFA  Inhale 2 puffs into the lungs every 6 (six) hours as needed for wheezing or  shortness of breath.     apixaban 5 MG Tabs tablet  Commonly known as:  ELIQUIS  Take 1 tablet (5 mg total) by mouth 2 (two) times daily.     cetirizine 10 MG tablet  Commonly known as:  ZYRTEC  Take 10 mg by mouth daily as needed for allergies or rhinitis.     dexamethasone 4 MG tablet  Commonly known as:  DECADRON  2 tab po bid, the day before, day of and day after chemotherapy every 3 weeks      diphenoxylate-atropine 2.5-0.025 MG per tablet  Commonly known as:  LOMOTIL  Take 2 tablets by mouth 4 (four) times daily as needed for diarrhea or loose stools.     folic acid 1 MG tablet  Commonly known as:  FOLVITE  TAKE 1 TABLET BY MOUTH EVERY DAY     glimepiride 1 MG tablet  Commonly known as:  AMARYL  Take 1 mg by mouth daily with breakfast.     hydrocortisone 25 MG suppository  Commonly known as:  ANUSOL-HC  Place 1 suppository (25 mg total) rectally 2 (two) times daily.     ICAPS Caps  Take 1 capsule by mouth 2 (two) times daily.     ketorolac 0.5 % ophthalmic solution  Commonly known as:  ACULAR  PLACE 1 DROP IN LEFT EYE 4 TIMES A DAY     KLOR-CON M10 10 MEQ tablet  Generic drug:  potassium chloride  Take 20 mEq by mouth daily.     levofloxacin 500 MG tablet  Commonly known as:  LEVAQUIN  Take 1 tablet (500 mg total) by mouth daily.     lidocaine-prilocaine cream  Commonly known as:  EMLA  Apply 1 application topically as needed.     loratadine 10 MG tablet  Commonly known as:  CLARITIN  Take 10 mg by mouth as directed. 3 days before chemo and 4 days after     magic mouthwash w/lidocaine Soln  Take 5 mLs by mouth 3 (three) times daily as needed for mouth pain.     metoprolol tartrate 25 MG tablet  Commonly known as:  LOPRESSOR  Take 1 tablet (25 mg total) by mouth 2 (two) times daily.     polyethylene glycol packet  Commonly known as:  MIRALAX / GLYCOLAX  Take 8.5 g by mouth daily as needed for moderate constipation.     torsemide 20 MG tablet  Commonly known as:  DEMADEX  Take 1 tablet (20 mg total) by mouth 2 (two) times daily.           Follow-up Information    Follow up with [name], MD. Schedule an appointment as soon as possible for a visit in 2 months.   Specialty:  Family Medicine   Contact information:   [place] [address] [phone]       Follow up with Eilleen Kempf., MD. Schedule an  appointment as soon as possible for a visit in 1 week.   Specialty:  Oncology   Contact information:   [address] [place] [address] [phone]       The results of significant diagnostics from this hospitalization (including imaging, microbiology, ancillary and laboratory) are listed below for reference.    Significant Diagnostic Studies: Dg Chest 2 View  07/31/2015   CLINICAL DATA:  Weakness, altered mental status and shortness of breath today. History of metastatic lung carcinoma. Subsequent encounter.  EXAM: CHEST  2 VIEW  COMPARISON:  CT chest, abdomen and pelvis 07/07/2015. Single view of the chest 03/31/2015.  FINDINGS: Port-A-Cath remains in place, unchanged. Small left pleural effusion is identified as on prior studies. No consolidative process is seen. The lungs are emphysematous. Small pulmonary nodules seen on the prior CT scan are not well demonstrated on this examination. Heart size is normal. No pneumothorax.  IMPRESSION: No acute finding.  Small left pleural effusion, unchanged.  Known bilateral pulmonary nodules are not well demonstrated on plain films.   Electronically Signed   By: Inge Rise M.D.   On: 07/31/2015 15:58   Ct Chest W Contrast  07/07/2015   CLINICAL DATA:  Subsequent encounter for lung cancer.  EXAM: CT CHEST, ABDOMEN, AND PELVIS WITH CONTRAST  TECHNIQUE: Multidetector CT imaging of the chest, abdomen and pelvis was performed following the standard protocol during bolus administration of intravenous contrast.  CONTRAST:  179m OMNIPAQUE IOHEXOL 300 MG/ML  SOLN  COMPARISON:  05/12/2015.  FINDINGS: CT CHEST FINDINGS  Mediastinum/Nodes: Right Port-A-Cath tip is positioned at the junction of the SVC and RA. There is no axillary lymphadenopathy. No mediastinal lymphadenopathy. No hilar lymphadenopathy. Mild circumferential wall thickening in the distal esophagus is stable. Heart is upper limits of normal for size. No pericardial effusion.  Lungs/Pleura:  New 8 mm right middle lobe pulmonary nodule is seen on image 37 series 5. Trace biapical pleural-parenchymal scarring is evident. There is a new peripheral 12 mm nodule in the left upper lobe on image 22 series 5. Interval progression of interstitial and airspace disease in the left lower lobe is associated with increasing volume loss. 10 mm left lower lobe nodule on image 38 series 5 is not substantially changed in the interval. 9 mm left lower lobe nodule on image 42 appears to be new in the interval.  Musculoskeletal: Bone windows reveal no worrisome lytic or sclerotic osseous lesions.  CT ABDOMEN AND PELVIS FINDINGS  Hepatobiliary: Lesion in the dome of the right liver measures 6.9 x 5.5 cm today when measuring at the same level that it was measured at previously. The lesion was 6.2 x 4.3 cm previously. This lesion was also measured on sagittal imaging previously at 5.2 x 3.3 and measuring at the same level on sagittal imaging today (image 36 series 603) the lesion is 6.0 x 5.0 cm.  There is no evidence for gallstones, gallbladder wall thickening, or pericholecystic fluid. No intrahepatic or extrahepatic biliary dilation.  Pancreas: No focal mass lesion. No dilatation of the main duct. No intraparenchymal cyst. No peripancreatic edema.  Spleen: No splenomegaly. No focal mass lesion.  Adrenals/Urinary Tract: Mild thickening of adrenal glands bilaterally without nodule or mass. No enhancing lesion in either kidney. No hydroureter. Urinary bladder is unremarkable.  Stomach/Bowel: Stomach is nondistended. No gastric wall thickening. No evidence of outlet obstruction. Duodenum is normally positioned as is the ligament of Treitz. No small bowel wall thickening. No small bowel dilatation. Terminal ileum is normal. Appendix is normal. Mild diverticular changes seen in the left colon without diverticulitis.  Vascular/Lymphatic: There is abdominal aortic atherosclerosis without aneurysm. Small lymph nodes in the  gastrohepatic ligament are not substantially changed. Borderline lymphadenopathy in the hepatoduodenal ligament is stable. No evidence for pelvic sidewall lymphadenopathy.  Reproductive: Prostate gland is enlarged. Seminal vesicles are unremarkable.  Other: No intraperitoneal free fluid.  Musculoskeletal: Bone windows reveal no worrisome lytic or sclerotic osseous lesions.  IMPRESSION: 1. New bilateral pulmonary nodules, highly concerning for interval development of pulmonary metastatic disease. 2. Worsening  left lower lobe collapse/consolidation. 3. Dominant heterogeneously enhancing lesion in the dome of the liver measures larger on today's study suggesting interval progression. 4. Stable appearance of circumferential wall thickening in the distal esophagus, presumably related esophagitis. 5. Abdominal aortic atherosclerosis.   Electronically Signed   By: Misty Stanley M.D.   On: 07/07/2015 15:08   Ct Abdomen Pelvis W Contrast  07/07/2015   CLINICAL DATA:  Subsequent encounter for lung cancer.  EXAM: CT CHEST, ABDOMEN, AND PELVIS WITH CONTRAST  TECHNIQUE: Multidetector CT imaging of the chest, abdomen and pelvis was performed following the standard protocol during bolus administration of intravenous contrast.  CONTRAST:  144m OMNIPAQUE IOHEXOL 300 MG/ML  SOLN  COMPARISON:  05/12/2015.  FINDINGS: CT CHEST FINDINGS  Mediastinum/Nodes: Right Port-A-Cath tip is positioned at the junction of the SVC and RA. There is no axillary lymphadenopathy. No mediastinal lymphadenopathy. No hilar lymphadenopathy. Mild circumferential wall thickening in the distal esophagus is stable. Heart is upper limits of normal for size. No pericardial effusion.  Lungs/Pleura: New 8 mm right middle lobe pulmonary nodule is seen on image 37 series 5. Trace biapical pleural-parenchymal scarring is evident. There is a new peripheral 12 mm nodule in the left upper lobe on image 22 series 5. Interval progression of interstitial and airspace  disease in the left lower lobe is associated with increasing volume loss. 10 mm left lower lobe nodule on image 38 series 5 is not substantially changed in the interval. 9 mm left lower lobe nodule on image 42 appears to be new in the interval.  Musculoskeletal: Bone windows reveal no worrisome lytic or sclerotic osseous lesions.  CT ABDOMEN AND PELVIS FINDINGS  Hepatobiliary: Lesion in the dome of the right liver measures 6.9 x 5.5 cm today when measuring at the same level that it was measured at previously. The lesion was 6.2 x 4.3 cm previously. This lesion was also measured on sagittal imaging previously at 5.2 x 3.3 and measuring at the same level on sagittal imaging today (image 36 series 603) the lesion is 6.0 x 5.0 cm.  There is no evidence for gallstones, gallbladder wall thickening, or pericholecystic fluid. No intrahepatic or extrahepatic biliary dilation.  Pancreas: No focal mass lesion. No dilatation of the main duct. No intraparenchymal cyst. No peripancreatic edema.  Spleen: No splenomegaly. No focal mass lesion.  Adrenals/Urinary Tract: Mild thickening of adrenal glands bilaterally without nodule or mass. No enhancing lesion in either kidney. No hydroureter. Urinary bladder is unremarkable.  Stomach/Bowel: Stomach is nondistended. No gastric wall thickening. No evidence of outlet obstruction. Duodenum is normally positioned as is the ligament of Treitz. No small bowel wall thickening. No small bowel dilatation. Terminal ileum is normal. Appendix is normal. Mild diverticular changes seen in the left colon without diverticulitis.  Vascular/Lymphatic: There is abdominal aortic atherosclerosis without aneurysm. Small lymph nodes in the gastrohepatic ligament are not substantially changed. Borderline lymphadenopathy in the hepatoduodenal ligament is stable. No evidence for pelvic sidewall lymphadenopathy.  Reproductive: Prostate gland is enlarged. Seminal vesicles are unremarkable.  Other: No  intraperitoneal free fluid.  Musculoskeletal: Bone windows reveal no worrisome lytic or sclerotic osseous lesions.  IMPRESSION: 1. New bilateral pulmonary nodules, highly concerning for interval development of pulmonary metastatic disease. 2. Worsening left lower lobe collapse/consolidation. 3. Dominant heterogeneously enhancing lesion in the dome of the liver measures larger on today's study suggesting interval progression. 4. Stable appearance of circumferential wall thickening in the distal esophagus, presumably related esophagitis. 5. Abdominal aortic atherosclerosis.  Electronically Signed   By: Misty Stanley M.D.   On: 07/07/2015 15:08    Microbiology: Recent Results (from the past 240 hour(s))  Culture, blood (routine x 2)     Status: None (Preliminary result)   Collection Time: 07/31/15  4:11 PM  Result Value Ref Range Status   Specimen Description BLOOD RIGHT CHEST  Final   Special Requests BOTTLES DRAWN AEROBIC AND ANAEROBIC 5 CC EACH  Final   Culture   Final    NO GROWTH 3 DAYS Performed at [facility]    Report Status PENDING  Incomplete  Urine culture     Status: None   Collection Time: 07/31/15  4:27 PM  Result Value Ref Range Status   Specimen Description URINE, CLEAN CATCH  Final   Special Requests NONE  Final   Culture   Final    50,000 COLONIES/mL GRAM POSITIVE RODS Performed at [facility]    Report Status 08/02/2015 FINAL  Final  Culture, blood (routine x 2)     Status: None (Preliminary result)   Collection Time: 07/31/15  8:28 PM  Result Value Ref Range Status   Specimen Description BLOOD BLOOD RIGHT HAND  Final   Special Requests BOTTLES DRAWN AEROBIC AND ANAEROBIC 5ML  Final   Culture   Final    NO GROWTH 3 DAYS Performed at [facility]    Report Status PENDING  Incomplete     Labs: Basic Metabolic Panel:  Recent Labs Lab 07/31/15 1407 08/01/15 0500  NA 136 137  K 3.8 3.8  CL  --  103  CO2 27 28  GLUCOSE 140 152*  BUN  19.8 19  CREATININE 0.9 0.68  CALCIUM 8.6 8.0*  MG  --  1.8   Liver Function Tests:  Recent Labs Lab 07/31/15 1407 08/01/15 0500  AST 16 19  ALT 23 20  ALKPHOS 57 45  BILITOT 1.34* 1.2  PROT 6.0* 5.4*  ALBUMIN 2.9* 2.6*   CBC:  Recent Labs Lab 07/31/15 1407 08/01/15 0500 08/02/15 0500 08/03/15 0500  WBC 0.4* 0.5* 0.7* 2.2*  NEUTROABS  --  0.0* 0.2* 1.6*  HGB 12.5* 10.8* 10.7* 10.8*  HCT 38.1* 33.2* 34.7* 33.5*  MCV 83.0 83.2 83.2 83.8  PLT 81* 90* 100* 105*    ProBNP (last 3 results)  Recent Labs  08/13/14 1222  PROBNP 1771.0*    CBG:  Recent Labs Lab 08/02/15 0757 08/02/15 1312 08/02/15 1735 08/02/15 2147 08/03/15 0743  GLUCAP 101* 130* 90 118* 103*    Signed:  Forrest Demuro  [facility] 08/03/2015, 1:52 PM

## 2015-08-04 ENCOUNTER — Telehealth: Payer: Self-pay | Admitting: Internal Medicine

## 2015-08-04 ENCOUNTER — Telehealth: Payer: Self-pay | Admitting: *Deleted

## 2015-08-04 ENCOUNTER — Other Ambulatory Visit: Payer: Self-pay | Admitting: *Deleted

## 2015-08-04 NOTE — Telephone Encounter (Signed)
Received call from pt's wife from [place] in scheduling. Pt has been discharged from hospital yesterday and was told her husband needed to be seen here within 1 week of discharge. Pt is scheduled for labs on 08/08/15 @ 11:30 am wife is hopeful that her husband can be seen that day. Please advise.

## 2015-08-04 NOTE — Telephone Encounter (Signed)
Per wife patient just d/c from hosp and she was told to call for an appointment. Wife forwarded to triage.

## 2015-08-04 NOTE — Telephone Encounter (Signed)
Transition Care Management Follow-up Telephone Call   Date discharged? 08/03/15   How have you been since you were released from the hospital? Feeling weak with poor appetite, but improved.   Do you understand why you were in the hospital? yes, neutropenia and UTI   Do you understand the discharge instructions? yes   Where were you discharged to? Home   Items Reviewed:  Medications reviewed: yes  Allergies reviewed: yes  Dietary changes reviewed: no  Referrals reviewed: yes, appointment with oncology scheduled   Functional Questionnaire:   Activities of Daily Living (ADLs):   He states they are independent in the following: ambulation, bathing and hygiene, feeding, continence, grooming, toileting and dressing States they require assistance with the following: None   Any transportation issues/concerns?: no   Any patient concerns? yes, patient's wife concerned with continued weakness and lack of appetite   Confirmed importance and date/time of follow-up visits scheduled yes, 08/17/15 @ 1215  Provider Appointment booked with [name], MD  Confirmed with patient if condition begins to worsen call PCP or go to the ER.  Patient was given the office number and encouraged to call back with question or concerns.  : yes

## 2015-08-05 LAB — CULTURE, BLOOD (ROUTINE X 2)
Culture: NO GROWTH
Culture: NO GROWTH

## 2015-08-05 NOTE — Telephone Encounter (Signed)
This is just routine hospital discharge. No need to see him until his next scheduled visit.

## 2015-08-07 ENCOUNTER — Telehealth: Payer: Self-pay | Admitting: *Deleted

## 2015-08-07 ENCOUNTER — Telehealth: Payer: Self-pay | Admitting: Nurse Practitioner

## 2015-08-07 NOTE — Telephone Encounter (Signed)
Patient was recently in the hospital due to neutropenia. Patient was given 3x granix injections. Patient is currently feeling fatigued, not eating, and very hard to get up. Patient wife would like him to be seen with symptom management after his lab appointment 08/08/15. POF sent for symptom management appointment.

## 2015-08-07 NOTE — Telephone Encounter (Signed)
Lft msg for pt confirming labs/flush/ov with NP/CB per 08/15 POF..... KJ

## 2015-08-08 ENCOUNTER — Ambulatory Visit (HOSPITAL_BASED_OUTPATIENT_CLINIC_OR_DEPARTMENT_OTHER): Payer: Medicare Other | Admitting: Nurse Practitioner

## 2015-08-08 ENCOUNTER — Ambulatory Visit: Payer: Medicare Other

## 2015-08-08 ENCOUNTER — Other Ambulatory Visit (HOSPITAL_BASED_OUTPATIENT_CLINIC_OR_DEPARTMENT_OTHER): Payer: Medicare Other

## 2015-08-08 ENCOUNTER — Ambulatory Visit: Payer: Medicare Other | Admitting: Internal Medicine

## 2015-08-08 ENCOUNTER — Other Ambulatory Visit: Payer: Medicare Other

## 2015-08-08 ENCOUNTER — Encounter: Payer: Self-pay | Admitting: Nurse Practitioner

## 2015-08-08 VITALS — BP 118/70 | HR 93 | Temp 98.0°F | Resp 24

## 2015-08-08 DIAGNOSIS — R0609 Other forms of dyspnea: Secondary | ICD-10-CM | POA: Diagnosis not present

## 2015-08-08 DIAGNOSIS — C787 Secondary malignant neoplasm of liver and intrahepatic bile duct: Secondary | ICD-10-CM

## 2015-08-08 DIAGNOSIS — K209 Esophagitis, unspecified without bleeding: Secondary | ICD-10-CM

## 2015-08-08 DIAGNOSIS — Z79899 Other long term (current) drug therapy: Secondary | ICD-10-CM

## 2015-08-08 DIAGNOSIS — E860 Dehydration: Secondary | ICD-10-CM

## 2015-08-08 DIAGNOSIS — I4891 Unspecified atrial fibrillation: Secondary | ICD-10-CM

## 2015-08-08 DIAGNOSIS — R530 Neoplastic (malignant) related fatigue: Secondary | ICD-10-CM

## 2015-08-08 DIAGNOSIS — C3492 Malignant neoplasm of unspecified part of left bronchus or lung: Secondary | ICD-10-CM

## 2015-08-08 DIAGNOSIS — T451X5A Adverse effect of antineoplastic and immunosuppressive drugs, initial encounter: Principal | ICD-10-CM

## 2015-08-08 DIAGNOSIS — C3412 Malignant neoplasm of upper lobe, left bronchus or lung: Secondary | ICD-10-CM

## 2015-08-08 DIAGNOSIS — Z95828 Presence of other vascular implants and grafts: Secondary | ICD-10-CM

## 2015-08-08 DIAGNOSIS — D61810 Antineoplastic chemotherapy induced pancytopenia: Secondary | ICD-10-CM

## 2015-08-08 DIAGNOSIS — Z7901 Long term (current) use of anticoagulants: Secondary | ICD-10-CM

## 2015-08-08 LAB — CBC WITH DIFFERENTIAL/PLATELET
BASO%: 0.8 % (ref 0.0–2.0)
Basophils Absolute: 0.1 10*3/uL (ref 0.0–0.1)
EOS%: 0.2 % (ref 0.0–7.0)
Eosinophils Absolute: 0 10*3/uL (ref 0.0–0.5)
HCT: 38.6 % (ref 38.4–49.9)
HEMOGLOBIN: 12.4 g/dL — AB (ref 13.0–17.1)
LYMPH%: 4.8 % — ABNORMAL LOW (ref 14.0–49.0)
MCH: 26.3 pg — AB (ref 27.2–33.4)
MCHC: 32.1 g/dL (ref 32.0–36.0)
MCV: 81.9 fL (ref 79.3–98.0)
MONO#: 0.6 10*3/uL (ref 0.1–0.9)
MONO%: 6 % (ref 0.0–14.0)
NEUT%: 88.2 % — ABNORMAL HIGH (ref 39.0–75.0)
NEUTROS ABS: 8.4 10*3/uL — AB (ref 1.5–6.5)
Platelets: 239 10*3/uL (ref 140–400)
RBC: 4.71 10*6/uL (ref 4.20–5.82)
RDW: 16.7 % — AB (ref 11.0–14.6)
WBC: 9.5 10*3/uL (ref 4.0–10.3)
lymph#: 0.5 10*3/uL — ABNORMAL LOW (ref 0.9–3.3)

## 2015-08-08 LAB — COMPREHENSIVE METABOLIC PANEL (CC13)
ALT: 31 U/L (ref 0–55)
ANION GAP: 13 meq/L — AB (ref 3–11)
AST: 47 U/L — AB (ref 5–34)
Albumin: 2.4 g/dL — ABNORMAL LOW (ref 3.5–5.0)
Alkaline Phosphatase: 74 U/L (ref 40–150)
BUN: 20.2 mg/dL (ref 7.0–26.0)
CHLORIDE: 98 meq/L (ref 98–109)
CO2: 27 mEq/L (ref 22–29)
CREATININE: 1.3 mg/dL (ref 0.7–1.3)
Calcium: 8.5 mg/dL (ref 8.4–10.4)
EGFR: 56 mL/min/{1.73_m2} — ABNORMAL LOW (ref >90)
Glucose: 117 mg/dl (ref 70–140)
POTASSIUM: 4.2 meq/L (ref 3.5–5.1)
Sodium: 137 mEq/L (ref 136–145)
Total Bilirubin: 0.7 mg/dL (ref 0.20–1.20)
Total Protein: 5.8 g/dL — ABNORMAL LOW (ref 6.4–8.3)

## 2015-08-08 LAB — TSH CHCC: TSH: 5.421 m[IU]/L — AB (ref 0.320–4.118)

## 2015-08-08 LAB — MAGNESIUM (CC13): MAGNESIUM: 1.8 mg/dL (ref 1.5–2.5)

## 2015-08-08 MED ORDER — MORPHINE SULFATE (PF) 4 MG/ML IV SOLN
INTRAVENOUS | Status: AC
  Filled 2015-08-08: qty 1

## 2015-08-08 MED ORDER — SODIUM CHLORIDE 0.9 % IV SOLN
INTRAVENOUS | Status: AC
  Administered 2015-08-08: 13:00:00 via INTRAVENOUS

## 2015-08-08 MED ORDER — SUCRALFATE 1 G PO TABS: 1.0000 g | ORAL_TABLET | Freq: Three times a day (TID) | ORAL | Status: DC

## 2015-08-08 MED ORDER — SODIUM CHLORIDE 0.9 % IJ SOLN
10.0000 mL | INTRAMUSCULAR | Status: DC | PRN
  Administered 2015-08-08: 10 mL via INTRAVENOUS
  Filled 2015-08-08: qty 10

## 2015-08-08 MED ORDER — MORPHINE SULFATE 4 MG/ML IJ SOLN
2.0000 mg | Freq: Once | INTRAMUSCULAR | Status: AC
  Administered 2015-08-08: 2 mg via INTRAVENOUS
  Filled 2015-08-08: qty 1

## 2015-08-08 MED ORDER — TRAMADOL HCL 50 MG PO TABS: 50.0000 mg | ORAL_TABLET | Freq: Four times a day (QID) | ORAL | Status: DC | PRN

## 2015-08-08 MED ORDER — HEPARIN SOD (PORK) LOCK FLUSH 100 UNIT/ML IV SOLN
500.0000 [IU] | Freq: Once | INTRAVENOUS | Status: AC
  Administered 2015-08-08: 500 [IU] via INTRAVENOUS
  Filled 2015-08-08: qty 5

## 2015-08-08 NOTE — Assessment & Plan Note (Signed)
Blood counts obtained today revealed WBC 9.5, ANC 8.4, hemoglobin 12.4, platelet count 239.  Patient denies any recent fever; but does have some intermittent chills.  Patient just completed his Levaquin anti-biotic just yesterday.  We'll continue to monitor closely.

## 2015-08-08 NOTE — Assessment & Plan Note (Signed)
Patient received his first cycle of Taxotere/cyramza chemotherapy on Tuesday 07/25/2015.  He received Neulasta for growth factor support on 07/27/2015.  Patient was admitted to the hospital last week on 07/31/2015 for neutropenia; and increased pleural effusions.  He was discharged within the last few days; and states that he just completed his Levaquin anti-biotics yesterday 08/07/2015.  Patient continues to complain of fatigue/weakness, and increased dyspnea as well.  He has been having to use home oxygen on a more regular basis.  Within the past few days.  He feels further dehydrated today as well.  He denies any recent fevers; but does complain of some intermittent significant chilling.  Patient is scheduled to return on 08/15/2015 for labs, visit, and his next cycle of chemotherapy.

## 2015-08-08 NOTE — Assessment & Plan Note (Signed)
Patient has history of atrial fib; and continues to take Eliquis on a daily basis.

## 2015-08-08 NOTE — Assessment & Plan Note (Signed)
Patient reports minimal appetite, poor oral intake, and feeling more dehydrated within the past few days.  Is also complaining of increased fatigue/generalized weakness as well.  On exam.  Patient does appear fatigued, weak, frail, and chronically ill.  Patient will receive 1 L normal saline IV fluid rehydration while cancer Center today.  He will also receive morphine 2 mg IV for complain of some generalized achiness; most likely from his most recent growth factor support.  Injection.  Patient was also encouraged to push fluids at home is much as possible.

## 2015-08-08 NOTE — Progress Notes (Signed)
SYMPTOM MANAGEMENT CLINIC   HPI: Shawn Espinoza 74 y.o. male diagnosed with lung cancer.  Currently undergoing Taxotere/Cyramza chemotherapy regimen.  Patient received his first cycle of Taxotere/cyramza chemotherapy on Tuesday 07/25/2015.  He received Neulasta for growth factor support on 07/27/2015.  Patient was admitted to the hospital last week on 07/31/2015 for neutropenia; and increased pleural effusions.  He was discharged within the last few days; and states that he just completed his Levaquin anti-biotics yesterday 08/07/2015.  Patient continues to complain of fatigue/weakness, and increased dyspnea as well.  He has been having to use home oxygen on a more regular basis.  Within the past few days.  He feels further dehydrated today as well.  He denies any recent fevers; but does complain of some intermittent significant chilling.  Shortness of Breath    Review of Systems  Respiratory: Positive for shortness of breath.     Past Medical History  Diagnosis Date  . History of diabetes mellitus, type II     resolved with diet, h/o neuropathy  . Diverticulosis of colon   . Hyperlipidemia   . Hypertension   . Fatty liver 02/29/00    abd ultrasound  . Lower back pain   . Systolic murmur 5726    2Decho - normal LV fxn, EF 55%, mild AS, biatrial enlargement  . History of tobacco use quit 1990s  . 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 Personal history of colonic adenomas 07/06/2013  . Hypertensive retinopathy of both eyes 2015    mild  . Dysrhythmia     Atrial fib (found 07/2014)  . Shortness of breath   . Pneumonia   . GERD (gastroesophageal reflux disease)     occasional  . Arthritis   . Neuropathy   . Constipation   . 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 Diabetes mellitus without complication     Type 2  .  Depression   . 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 Colonoscopy  2004  . Colonoscopy  2014    tubular adenoma x1, mod diverticulosis ([place])  . Flexible bronchoscopy  02/2014    WNL  . Video bronchoscopy Bilateral 03/17/2014    Procedure: VIDEO BRONCHOSCOPY WITH FLUORO;  Surgeon: Tanda Rockers, MD;  Location: [facility];  Service: Cardiopulmonary;  Laterality: Bilateral;  . Chest tube insertion Left 08/26/2014    Procedure: INSERTION OF LEFT  PLEURAL DRAINAGE CATHETER;  Surgeon: Ivin Poot, MD;  Location: [place];  Service: Thoracic;  Laterality: Left;  . Cataract extraction Right 03/2015    Dr [name]  . Portacath placement Right 03/31/2015    Procedure: INSERTION PORT-A-CATH;  Surgeon: Ivin Poot, MD;  Location[facility];  Service: Thoracic;  Laterality: Right;  . Removal of pleural drainage catheter Left 03/31/2015    Procedure: REMOVAL OF PLEURAL DRAINAGE CATHETER;  Surgeon: Ivin Poot, MD;  Location: [place];  Service: Thoracic;  Laterality: Left;    has Steroid-induced diabetes mellitus; HLD (hyperlipidemia); History of tobacco use; Essential hypertension; Hemorrhoids; DIVERTICULOSIS, COLON; FATTY LIVER DISEASE; Medicare annual wellness visit, subsequent; Positive hepatitis C antibody test; Systolic murmur; Lower back pain; Polycythemia; BPH (benign prostatic hypertrophy); Right knee pain; Personal history  of colonic adenoma; DOE (dyspnea on exertion); Adenocarcinoma of left lung, stage 4; Liver lesion; Recurrent left pleural effusion; Skin rash; Other pancytopenia; Hypoalbuminemia; Interstitial pneumonitis; CHF (congestive heart failure); Thrombocytopenia; Peripheral edema; Protein-calorie malnutrition; Orthostatic hypotension; Neoplastic malignant related fatigue; Atrial fibrillation, unspecified; Hypothyroid; Thrush; Hearing loss; Advanced care planning/counseling discussion; Antineoplastic  chemotherapy induced pancytopenia; Neuropathic pain, leg, bilateral; Encounter for antineoplastic chemotherapy; Neutropenia; Myalgia; Neutropenia, drug-induced; Altered mental state; Constipation; Long term current use of anticoagulant therapy; Esophagitis; and Dehydration on his problem list.    is allergic to candesartan cilexetil; diltiazem hcl; doxazosin mesylate; nifedipine; pravastatin; red yeast rice; rosuvastatin; sertraline; simvastatin; and hydrocodone.    Medication List       This list is accurate as of: 08/08/15 12:32 PM.  Always use your most recent med list.               albuterol 108 (90 BASE) MCG/ACT inhaler  Commonly known as:  PROVENTIL HFA;VENTOLIN HFA  Inhale 2 puffs into the lungs every 6 (six) hours as needed for wheezing or shortness of breath.     apixaban 5 MG Tabs tablet  Commonly known as:  ELIQUIS  Take 1 tablet (5 mg total) by mouth 2 (two) times daily.     cetirizine 10 MG tablet  Commonly known as:  ZYRTEC  Take 10 mg by mouth daily as needed for allergies or rhinitis.     dexamethasone 4 MG tablet  Commonly known as:  DECADRON  2 tab po bid, the day before, day of and day after chemotherapy every 3 weeks     diphenoxylate-atropine 2.5-0.025 MG per tablet  Commonly known as:  LOMOTIL  Take 2 tablets by mouth 4 (four) times daily as needed for diarrhea or loose stools.     folic acid 1 MG tablet  Commonly known as:  FOLVITE  TAKE 1 TABLET BY MOUTH EVERY DAY     glimepiride 1 MG tablet  Commonly known as:  AMARYL  Take 1 mg by mouth daily with breakfast.     hydrocortisone 25 MG suppository  Commonly known as:  ANUSOL-HC  Place 1 suppository (25 mg total) rectally 2 (two) times daily.     ICAPS Caps  Take 1 capsule by mouth 2 (two) times daily.     ketorolac 0.5 % ophthalmic solution  Commonly known as:  ACULAR  PLACE 1 DROP IN LEFT EYE 4 TIMES A DAY     KLOR-CON M10 10 MEQ tablet  Generic drug:  potassium chloride  Take 20 mEq by  mouth daily.     lidocaine-prilocaine cream  Commonly known as:  EMLA  Apply 1 application topically as needed.     loratadine 10 MG tablet  Commonly known as:  CLARITIN  Take 10 mg by mouth as directed. 3 days before chemo and 4 days after     magic mouthwash w/lidocaine Soln  Take 5 mLs by mouth 3 (three) times daily as needed for mouth pain.     metoprolol tartrate 25 MG tablet  Commonly known as:  LOPRESSOR  Take 1 tablet (25 mg total) by mouth 2 (two) times daily.     polyethylene glycol packet  Commonly known as:  MIRALAX / GLYCOLAX  Take 8.5 g by mouth daily as needed for moderate constipation.     sucralfate 1 G tablet  Commonly known as:  CARAFATE  Take 1 tablet (1 g total) by mouth 4 (four) times daily -  with meals and at bedtime.  torsemide 20 MG tablet  Commonly known as:  DEMADEX  Take 1 tablet (20 mg total) by mouth 2 (two) times daily.         PHYSICAL EXAMINATION  Oncology Vitals 08/08/2015 08/03/2015 08/02/2015 08/02/2015 08/02/2015 08/02/2015 08/01/2015  Height - - - - - - -  Weight - - - - 81.829 kg - -  Weight (lbs) - - - - 180 lbs 6 oz - -  BMI (kg/m2) - - - - 26.64 kg/m2 - -  Temp 98 98.6 100.1 98.1 - 98.3 99.3  Pulse 93 105 112 98 - 100 109  Resp _0 - 20 20  SpO2 94 98 95 100 - 100 97  BSA (m2) - - - - 2 m2 - -   BP Readings from Last 3 Encounters:  08/08/15 118/70  08/03/15 124/82  07/31/15 144/80    Physical Exam  Constitutional: He is oriented to person, place, and time.  Patient appears fatigued, mildly weak, pale, and chronically ill.  HENT:  Head: Normocephalic and atraumatic.  Mouth/Throat: Oropharynx is clear and moist.  Eyes: Conjunctivae and EOM are normal. Pupils are equal, round, and reactive to light. Right eye exhibits no discharge. Left eye exhibits no discharge. No scleral icterus.  Neck: Normal range of motion. Neck supple. No JVD present. No tracheal deviation present. No thyromegaly present.  Cardiovascular:  Normal rate, regular rhythm, normal heart sounds and intact distal pulses.   Pulmonary/Chest: Effort normal and breath sounds normal. No respiratory distress. He has no wheezes. He has no rales. He exhibits no tenderness.  Abdominal: Soft. Bowel sounds are normal. He exhibits no distension and no mass. There is no tenderness. There is no rebound and no guarding.  Musculoskeletal: Normal range of motion. He exhibits no edema or tenderness.  Lymphadenopathy:    He has no cervical adenopathy.  Neurological: He is alert and oriented to person, place, and time. Gait normal. GCS score is 15.  Skin: Skin is warm and dry. No rash noted. No erythema. There is pallor.  Psychiatric: Affect normal.  Nursing note and vitals reviewed.   LABORATORY DATA:. Appointment on 08/08/2015  Component Date Value Ref Range Status  . WBC 08/08/2015 9.5  4.0 - 10.3 10e3/uL Final  . NEUT# 08/08/2015 8.4* 1.5 - 6.5 10e3/uL Final  . HGB 08/08/2015 12.4* 13.0 - 17.1 g/dL Final  . HCT 08/08/2015 38.6  38.4 - 49.9 % Final  . Platelets 08/08/2015 239  140 - 400 10e3/uL Final  . MCV 08/08/2015 81.9  79.3 - 98.0 fL Final  . MCH 08/08/2015 26.3* 27.2 - 33.4 pg Final  . MCHC 08/08/2015 32.1  32.0 - 36.0 g/dL Final  . RBC 08/08/2015 4.71  4.20 - 5.82 10e6/uL Final  . RDW 08/08/2015 16.7* 11.0 - 14.6 % Final  . lymph# 08/08/2015 0.5* 0.9 - 3.3 10e3/uL Final  . MONO# 08/08/2015 0.6  0.1 - 0.9 10e3/uL Final  . Eosinophils Absolute 08/08/2015 0.0  0.0 - 0.5 10e3/uL Final  . Basophils Absolute 08/08/2015 0.1  0.0 - 0.1 10e3/uL Final  . NEUT% 08/08/2015 88.2* 39.0 - 75.0 % Final  . LYMPH% 08/08/2015 4.8* 14.0 - 49.0 % Final  . MONO% 08/08/2015 6.0  0.0 - 14.0 % Final  . EOS% 08/08/2015 0.2  0.0 - 7.0 % Final  . BASO% 08/08/2015 0.8  0.0 - 2.0 % Final  . Magnesium 08/08/2015 1.8  1.5 - 2.5 mg/dl Final  . Sodium 08/08/2015 137  136 -  145 mEq/L Final  . Potassium 08/08/2015 4.2  3.5 - 5.1 mEq/L Final  . Chloride 08/08/2015 98   98 - 109 mEq/L Final  . CO2 08/08/2015 27  22 - 29 mEq/L Final  . Glucose 08/08/2015 117  70 - 140 mg/dl Final  . BUN 08/08/2015 20.2  7.0 - 26.0 mg/dL Final  . Creatinine 08/08/2015 1.3  0.7 - 1.3 mg/dL Final  . Total Bilirubin 08/08/2015 0.70  0.20 - 1.20 mg/dL Final  . Alkaline Phosphatase 08/08/2015 74  40 - 150 U/L Final  . AST 08/08/2015 47* 5 - 34 U/L Final  . ALT 08/08/2015 31  0 - 55 U/L Final  . Total Protein 08/08/2015 5.8* 6.4 - 8.3 g/dL Final  . Albumin 08/08/2015 2.4* 3.5 - 5.0 g/dL Final  . Calcium 08/08/2015 8.5  8.4 - 10.4 mg/dL Final  . Anion Gap 08/08/2015 13* 3 - 11 mEq/L Final  . EGFR 08/08/2015 56* >90 ml/min/1.73 m2 Final   eGFR is calculated using the CKD-EPI Creatinine Equation (2009)     RADIOGRAPHIC STUDIES: No results found.  ASSESSMENT/PLAN:    Adenocarcinoma of left lung, stage 4 Patient received his first cycle of Taxotere/cyramza chemotherapy on Tuesday 07/25/2015.  He received Neulasta for growth factor support on 07/27/2015.  Patient was admitted to the hospital last week on 07/31/2015 for neutropenia; and increased pleural effusions.  He was discharged within the last few days; and states that he just completed his Levaquin anti-biotics yesterday 08/07/2015.  Patient continues to complain of fatigue/weakness, and increased dyspnea as well.  He has been having to use home oxygen on a more regular basis.  Within the past few days.  He feels further dehydrated today as well.  He denies any recent fevers; but does complain of some intermittent significant chilling.  Patient is scheduled to return on 08/15/2015 for labs, visit, and his next cycle of chemotherapy.        Antineoplastic chemotherapy induced pancytopenia Blood counts obtained today revealed WBC 9.5, ANC 8.4, hemoglobin 12.4, platelet count 239.  Patient denies any recent fever; but does have some intermittent chills.  Patient just completed his Levaquin anti-biotic just yesterday.  We'll  continue to monitor closely.  Dehydration Patient reports minimal appetite, poor oral intake, and feeling more dehydrated within the past few days.  Is also complaining of increased fatigue/generalized weakness as well.  On exam.  Patient does appear fatigued, weak, frail, and chronically ill.  Patient will receive 1 L normal saline IV fluid rehydration while cancer Center today.  He will also receive morphine 2 mg IV for complain of some generalized achiness; most likely from his most recent growth factor support.  Injection.  Patient was also encouraged to push fluids at home is much as possible.  Esophagitis Patient is now complaining of some increased esophagitis/heartburn symptoms.  Is requesting a refill of his Carafate.  Refill.  Called in.  Long term current use of anticoagulant therapy Patient has history of atrial fib; and continues to take Eliquis on a daily basis.  Neoplastic malignant related fatigue Patient is complaining of increased generalized fatigue and weakness for the past few days.  Blood counts have actually improved her blood draw today.  Most likely, increased fatigue and weakness is secondary to dehydration.  Hopefully, the IV fluid rehydration will help the patient to feel better.   Patient stated understanding of all instructions; and was in agreement with this plan of care. The patient knows to call the clinic  with any problems, questions or concerns.   This was a shared visit with Dr. [name] today.  Total time spent with patient was 25 minutes;  with greater than 75 percent of that time spent in face to face counseling regarding patient's symptoms,  and coordination of care and follow up.  Disclaimer: This note was dictated with voice recognition software. Similar sounding words can inadvertently be transcribed and may not be corrected upon review.   Drue Second, NP 08/08/2015   ADDENDUM: Hematology/Oncology Attending: I had a face to face encounter  with the patient. I recommended his care plan. This is a pleasant 74 years old white male with metastatic non-small cell lung cancer, adenocarcinoma status post several chemotherapy regimens as well as immunotherapy. The patient is currently undergoing systemic chemotherapy with docetaxel and Cyramza status post 1 cycle. He was recently admitted to [facility] with significant neutropenia as well as weakness and fatigue and mental status change. He is feeling a little bit better but continues to have lack of appetite and dehydration as well as esophagitis. We will arrange for the patient to receive IV hydration with normal saline today. For the esophagitis he was given a refill of Carafate. He will come back for follow-up visit as previously scheduled for evaluation before starting the second cycle of his systemic chemotherapy. The patient was advised to call immediately if he has any concerning symptoms in the interval.  Disclaimer: This note was dictated with voice recognition software. Similar sounding words can inadvertently be transcribed and may be missed upon review. Eilleen Kempf., MD 08/12/2015

## 2015-08-08 NOTE — Assessment & Plan Note (Signed)
Patient is now complaining of some increased esophagitis/heartburn symptoms.  Is requesting a refill of his Carafate.  Refill.  Called in.

## 2015-08-08 NOTE — Assessment & Plan Note (Signed)
Patient is complaining of increased generalized fatigue and weakness for the past few days.  Blood counts have actually improved her blood draw today.  Most likely, increased fatigue and weakness is secondary to dehydration.  Hopefully, the IV fluid rehydration will help the patient to feel better.

## 2015-08-09 ENCOUNTER — Ambulatory Visit: Payer: Medicare Other

## 2015-08-10 ENCOUNTER — Ambulatory Visit: Payer: Medicare Other | Admitting: Podiatry

## 2015-08-15 ENCOUNTER — Ambulatory Visit (HOSPITAL_BASED_OUTPATIENT_CLINIC_OR_DEPARTMENT_OTHER): Payer: Medicare Other

## 2015-08-15 ENCOUNTER — Telehealth: Payer: Self-pay | Admitting: Internal Medicine

## 2015-08-15 ENCOUNTER — Encounter: Payer: Self-pay | Admitting: Internal Medicine

## 2015-08-15 ENCOUNTER — Other Ambulatory Visit (HOSPITAL_BASED_OUTPATIENT_CLINIC_OR_DEPARTMENT_OTHER): Payer: Medicare Other

## 2015-08-15 ENCOUNTER — Ambulatory Visit (HOSPITAL_BASED_OUTPATIENT_CLINIC_OR_DEPARTMENT_OTHER): Payer: Medicare Other | Admitting: Internal Medicine

## 2015-08-15 ENCOUNTER — Ambulatory Visit: Payer: Medicare Other

## 2015-08-15 VITALS — BP 108/75 | HR 77 | Resp 19 | Ht 69.0 in | Wt 173.8 lb

## 2015-08-15 DIAGNOSIS — C787 Secondary malignant neoplasm of liver and intrahepatic bile duct: Secondary | ICD-10-CM

## 2015-08-15 DIAGNOSIS — C3412 Malignant neoplasm of upper lobe, left bronchus or lung: Secondary | ICD-10-CM | POA: Diagnosis not present

## 2015-08-15 DIAGNOSIS — C3492 Malignant neoplasm of unspecified part of left bronchus or lung: Secondary | ICD-10-CM

## 2015-08-15 DIAGNOSIS — Z5111 Encounter for antineoplastic chemotherapy: Secondary | ICD-10-CM

## 2015-08-15 DIAGNOSIS — I4891 Unspecified atrial fibrillation: Secondary | ICD-10-CM | POA: Diagnosis not present

## 2015-08-15 DIAGNOSIS — Z5112 Encounter for antineoplastic immunotherapy: Secondary | ICD-10-CM

## 2015-08-15 DIAGNOSIS — R0609 Other forms of dyspnea: Secondary | ICD-10-CM | POA: Diagnosis not present

## 2015-08-15 DIAGNOSIS — Z95828 Presence of other vascular implants and grafts: Secondary | ICD-10-CM

## 2015-08-15 DIAGNOSIS — R609 Edema, unspecified: Secondary | ICD-10-CM

## 2015-08-15 LAB — CBC WITH DIFFERENTIAL/PLATELET
BASO%: 0 % (ref 0.0–2.0)
Basophils Absolute: 0 10*3/uL (ref 0.0–0.1)
EOS ABS: 0 10*3/uL (ref 0.0–0.5)
EOS%: 0 % (ref 0.0–7.0)
HCT: 35.1 % — ABNORMAL LOW (ref 38.4–49.9)
HEMOGLOBIN: 11.4 g/dL — AB (ref 13.0–17.1)
LYMPH%: 4.3 % — ABNORMAL LOW (ref 14.0–49.0)
MCH: 26.6 pg — AB (ref 27.2–33.4)
MCHC: 32.5 g/dL (ref 32.0–36.0)
MCV: 82 fL (ref 79.3–98.0)
MONO#: 0.3 10*3/uL (ref 0.1–0.9)
MONO%: 3.3 % (ref 0.0–14.0)
NEUT#: 7.4 10*3/uL — ABNORMAL HIGH (ref 1.5–6.5)
NEUT%: 92.4 % — ABNORMAL HIGH (ref 39.0–75.0)
PLATELETS: 259 10*3/uL (ref 140–400)
RBC: 4.28 10*6/uL (ref 4.20–5.82)
RDW: 15.7 % — AB (ref 11.0–14.6)
WBC: 8 10*3/uL (ref 4.0–10.3)
lymph#: 0.3 10*3/uL — ABNORMAL LOW (ref 0.9–3.3)

## 2015-08-15 LAB — COMPREHENSIVE METABOLIC PANEL (CC13)
ALBUMIN: 2.4 g/dL — AB (ref 3.5–5.0)
ALT: 21 U/L (ref 0–55)
ANION GAP: 11 meq/L (ref 3–11)
AST: 19 U/L (ref 5–34)
Alkaline Phosphatase: 61 U/L (ref 40–150)
BILIRUBIN TOTAL: 0.4 mg/dL (ref 0.20–1.20)
BUN: 25.7 mg/dL (ref 7.0–26.0)
CALCIUM: 8.6 mg/dL (ref 8.4–10.4)
CO2: 25 mEq/L (ref 22–29)
Chloride: 105 mEq/L (ref 98–109)
Creatinine: 1.1 mg/dL (ref 0.7–1.3)
EGFR: 68 mL/min/{1.73_m2} — ABNORMAL LOW (ref >90)
Glucose: 308 mg/dl — ABNORMAL HIGH (ref 70–140)
POTASSIUM: 4.4 meq/L (ref 3.5–5.1)
Sodium: 140 mEq/L (ref 136–145)
TOTAL PROTEIN: 5.5 g/dL — AB (ref 6.4–8.3)

## 2015-08-15 MED ORDER — DIPHENHYDRAMINE HCL 50 MG/ML IJ SOLN
INTRAMUSCULAR | Status: AC
  Filled 2015-08-15: qty 1

## 2015-08-15 MED ORDER — ACETAMINOPHEN 325 MG PO TABS
650.0000 mg | ORAL_TABLET | Freq: Once | ORAL | Status: AC
  Administered 2015-08-15: 650 mg via ORAL

## 2015-08-15 MED ORDER — SODIUM CHLORIDE 0.9 % IJ SOLN
10.0000 mL | INTRAMUSCULAR | Status: DC | PRN
  Administered 2015-08-15: 10 mL via INTRAVENOUS
  Filled 2015-08-15: qty 10

## 2015-08-15 MED ORDER — DOCETAXEL CHEMO INJECTION 160 MG/16ML
60.0000 mg/m2 | Freq: Once | INTRAVENOUS | Status: AC
  Administered 2015-08-15: 120 mg via INTRAVENOUS
  Filled 2015-08-15: qty 12

## 2015-08-15 MED ORDER — SODIUM CHLORIDE 0.9 % IJ SOLN
10.0000 mL | INTRAMUSCULAR | Status: DC | PRN
  Administered 2015-08-15: 10 mL
  Filled 2015-08-15: qty 10

## 2015-08-15 MED ORDER — SODIUM CHLORIDE 0.9 % IV SOLN
Freq: Once | INTRAVENOUS | Status: AC
  Administered 2015-08-15: 14:00:00 via INTRAVENOUS
  Filled 2015-08-15: qty 4

## 2015-08-15 MED ORDER — SODIUM CHLORIDE 0.9 % IV SOLN
Freq: Once | INTRAVENOUS | Status: AC
  Administered 2015-08-15: 12:00:00 via INTRAVENOUS

## 2015-08-15 MED ORDER — DIPHENHYDRAMINE HCL 50 MG/ML IJ SOLN
50.0000 mg | Freq: Once | INTRAMUSCULAR | Status: AC
Start: 2015-08-15 — End: 2015-08-15
  Administered 2015-08-15: 50 mg via INTRAVENOUS

## 2015-08-15 MED ORDER — ACETAMINOPHEN 325 MG PO TABS
ORAL_TABLET | ORAL | Status: AC
  Filled 2015-08-15: qty 2

## 2015-08-15 MED ORDER — HEPARIN SOD (PORK) LOCK FLUSH 100 UNIT/ML IV SOLN
500.0000 [IU] | Freq: Once | INTRAVENOUS | Status: AC | PRN
  Administered 2015-08-15: 500 [IU]
  Filled 2015-08-15: qty 5

## 2015-08-15 MED ORDER — SODIUM CHLORIDE 0.9 % IV SOLN
800.0000 mg | Freq: Once | INTRAVENOUS | Status: AC
  Administered 2015-08-15: 800 mg via INTRAVENOUS
  Filled 2015-08-15: qty 80

## 2015-08-15 NOTE — Telephone Encounter (Signed)
Gave adn printed aptp sched and avs fo rpt for Aug thru OCT

## 2015-08-15 NOTE — Patient Instructions (Signed)
[facility] Discharge Instructions for Patients Receiving Chemotherapy  Today you received the following chemotherapy agents: Cyramza and Taxotere.  To help prevent nausea and vomiting after your treatment, we encourage you to take your nausea medication: Compazine. Take one every 6 hours as needed.   If you develop nausea and vomiting that is not controlled by your nausea medication, call the clinic.   BELOW ARE SYMPTOMS THAT SHOULD BE REPORTED IMMEDIATELY:  *FEVER GREATER THAN 100.5 F  *CHILLS WITH OR WITHOUT FEVER  NAUSEA AND VOMITING THAT IS NOT CONTROLLED WITH YOUR NAUSEA MEDICATION  *UNUSUAL SHORTNESS OF BREATH  *UNUSUAL BRUISING OR BLEEDING  TENDERNESS IN MOUTH AND THROAT WITH OR WITHOUT PRESENCE OF ULCERS  *URINARY PROBLEMS  *BOWEL PROBLEMS  UNUSUAL RASH Items with * indicate a potential emergency and should be followed up as soon as possible.  Feel free to call the clinic should you have any questions or concerns. The clinic phone number is [phone].  Please show the [facility] at check-in to the Emergency Department and triage nurse.

## 2015-08-15 NOTE — Progress Notes (Signed)
[place] [phone]   Fax:([phone] [place], [place] [address]  DIAGNOSIS: Stage IIIB/IV non-small cell lung cancer, adenocarcinoma diagnosed in March of 2015.  PRIOR THERAPY:  1) Systemic chemotherapy with carboplatin for AUC of 5 and Alimta 500 mg/M2 every 3 weeks. First dose expected on 04/13/2014. Status post 6 cycles. 2)  Maintenance chemotherapy with single agent Alimta 500 mg/M2 every 3 weeks. First cycle on 09/06/2014. Status post 3 cycles. 3) Second course of systemic chemotherapy with carboplatin for AUC of 5 and Alimta 500 MG/M2 every 3 weeks. First cycle 11/08/2014. Status post 6 cycles. 4)  Immunotherapy with Nivolumab 3 MG/KG every 2 weeks. First dose 05/24/2015. Status post 3 cycles.  CURRENT THERAPY: Systemic chemotherapy with docetaxel 75 MG/M2 and Cyramza 10 MG/KG every 3 weeks status post 1 cycle.  CODE STATUS: No CODE BLUE .  INTERVAL HISTORY: KUTLER VANVRANKEN 74 y.o. male returns to the clinic today for follow up visit accompanied by his wife. He is currently undergoing systemic chemotherapy with docetaxel and Cyramza status post 1 cycle. He tolerated the first cycle of his treatment well except for severe neutropenia and he was admitted to [facility] for evaluation and treatment. He continues to have few dizzy spells but the patient is currently on metoprolol in addition to [facility] for diuresis. He denied having any significant chest pain but continues to have shortness of breath with exertion and mild cough with no hemoptysis. He has no significant fever or chills, no nausea or vomiting. He is here today to start cycle #2 of his systemic chemotherapy.  MEDICAL HISTORY: Past Medical History  Diagnosis Date  . History of diabetes mellitus, type II     resolved with diet, h/o neuropathy  . Diverticulosis of colon   . Hyperlipidemia   . Hypertension   .  Fatty liver 02/29/00    abd ultrasound  . Lower back pain   . Systolic murmur 6144    2Decho - normal LV fxn, EF 55%, mild AS, biatrial enlargement  . History of tobacco use quit 1990s  . 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 Personal history of colonic adenomas 07/06/2013  . Hypertensive retinopathy of both eyes 2015    mild  . Dysrhythmia     Atrial fib (found 07/2014)  . Shortness of breath   . Pneumonia   . GERD (gastroesophageal reflux disease)     occasional  . Arthritis   . Neuropathy   . Constipation   . 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 Diabetes mellitus without complication     Type 2  . Depression   . Non-small cell carcinoma of left lung 02/2014    stage IIIb/IV on chemo    ALLERGIES:  is allergic to candesartan cilexetil; diltiazem hcl; doxazosin mesylate; nifedipine; pravastatin; red yeast rice; rosuvastatin; sertraline; simvastatin; and hydrocodone.  MEDICATIONS:  Current Outpatient Prescriptions  Medication Sig Dispense Refill  . albuterol (PROVENTIL HFA;VENTOLIN HFA) 108 (90 BASE) MCG/ACT inhaler Inhale 2 puffs into the lungs every 6 (six) hours as needed for wheezing or shortness of breath. 1 Inhaler 0  . Alum & Mag Hydroxide-Simeth (MAGIC MOUTHWASH W/LIDOCAINE) SOLN Take 5 mLs by mouth 3 (three) times daily  as needed for mouth pain. 5 mL 0  . apixaban (ELIQUIS) 5 MG TABS tablet Take 1 tablet (5 mg total) by mouth 2 (two) times daily. 60 tablet 6  . cetirizine (ZYRTEC) 10 MG tablet Take 10 mg by mouth daily as needed for allergies or rhinitis.     Marland Kitchen dexamethasone (DECADRON) 4 MG tablet 2 tab po bid, the day before, day of and day after chemotherapy every 3 weeks 40 tablet 1  . diphenoxylate-atropine (LOMOTIL) 2.5-0.025 MG per tablet Take 2 tablets by mouth 4 (four) times daily as needed for diarrhea or loose  stools. 30 tablet 0  . folic acid (FOLVITE) 1 MG tablet TAKE 1 TABLET BY MOUTH EVERY DAY 30 tablet 4  . glimepiride (AMARYL) 1 MG tablet Take 1 mg by mouth daily with breakfast.    . hydrocortisone (ANUSOL-HC) 25 MG suppository Place 1 suppository (25 mg total) rectally 2 (two) times daily. 12 suppository 0  . ketorolac (ACULAR) 0.5 % ophthalmic solution PLACE 1 DROP IN LEFT EYE 4 TIMES A DAY  4  . KLOR-CON M10 10 MEQ tablet Take 20 mEq by mouth daily.   10  . levofloxacin (LEVAQUIN) 500 MG tablet   0  . lidocaine-prilocaine (EMLA) cream Apply 1 application topically as needed. 30 g 1  . loratadine (CLARITIN) 10 MG tablet Take 10 mg by mouth as directed. 3 days before chemo and 4 days after    . metoprolol tartrate (LOPRESSOR) 25 MG tablet Take 1 tablet (25 mg total) by mouth 2 (two) times daily. 60 tablet 6  . Multiple Vitamins-Minerals (ICAPS) CAPS Take 1 capsule by mouth 2 (two) times daily.     . polyethylene glycol (MIRALAX / GLYCOLAX) packet Take 8.5 g by mouth daily as needed for moderate constipation.     . sucralfate (CARAFATE) 1 G tablet Take 1 tablet (1 g total) by mouth 4 (four) times daily -  with meals and at bedtime. 50 tablet 1  . torsemide (DEMADEX) 20 MG tablet Take 1 tablet (20 mg total) by mouth 2 (two) times daily. (Patient taking differently: Take 20 mg by mouth daily. ) 60 tablet 11  . traMADol (ULTRAM) 50 MG tablet Take 1 tablet (50 mg total) by mouth every 6 (six) hours as needed. 45 tablet 0   No current facility-administered medications for this visit.    SURGICAL HISTORY:  Past Surgical History  Procedure Laterality Date  . Colonoscopy  2004  . Colonoscopy  2014    tubular adenoma x1, mod diverticulosis ([place])  . Flexible bronchoscopy  02/2014    WNL  . Video bronchoscopy Bilateral 03/17/2014    Procedure: VIDEO BRONCHOSCOPY WITH FLUORO;  Surgeon: Tanda Rockers, MD;  Location: [facility];  Service: Cardiopulmonary;  Laterality: Bilateral;  . Chest tube  insertion Left 08/26/2014    Procedure: INSERTION OF LEFT  PLEURAL DRAINAGE CATHETER;  Surgeon: Ivin Poot, MD;  Location: [facility];  Service: Thoracic;  Laterality: Left;  . Cataract extraction Right 03/2015    Dr [name]  . Portacath placement Right 03/31/2015    Procedure: INSERTION PORT-A-CATH;  Surgeon: Ivin Poot, MD;  Location: [facility];  Service: Thoracic;  Laterality: Right;  . Removal of pleural drainage catheter Left 03/31/2015    Procedure: REMOVAL OF PLEURAL DRAINAGE CATHETER;  Surgeon: Ivin Poot, MD;  Location: [facility];  Service: Thoracic;  Laterality: Left;    REVIEW OF SYSTEMS:  Constitutional: positive for fatigue Eyes: negative Ears, nose, mouth,  throat, and face: negative Respiratory: positive for cough and dyspnea on exertion Cardiovascular: negative Gastrointestinal: negative Genitourinary:negative Integument/breast: negative Hematologic/lymphatic: negative Musculoskeletal:negative Neurological: negative Behavioral/Psych: negative Endocrine: negative Allergic/Immunologic: negative   PHYSICAL EXAMINATION: General appearance: alert, cooperative, fatigued and no distress Head: Normocephalic, without obvious abnormality, atraumatic Neck: no adenopathy, no JVD, supple, symmetrical, trachea midline and thyroid not enlarged, symmetric, no tenderness/mass/nodules Lymph nodes: Cervical, supraclavicular, and axillary nodes normal. Resp: diminished breath sounds LLL and dullness to percussion LLL Back: symmetric, no curvature. ROM normal. No CVA tenderness. Cardio: regular rate and rhythm, S1, S2 normal, no murmur, click, rub or gallop GI: soft, non-tender; bowel sounds normal; no masses,  no organomegaly Extremities: edema 2+ Neurologic: Alert and oriented X 3, normal strength and tone. Normal symmetric reflexes. Normal coordination and gait  ECOG PERFORMANCE STATUS: 1 - Symptomatic but completely ambulatory  Blood pressure 108/75, pulse 77, resp. rate 19, height 5'  9" (1.753 m), weight 173 lb 12.8 oz (78.835 kg), SpO2 99 %.  LABORATORY DATA: Lab Results  Component Value Date   WBC 8.0 08/15/2015   HGB 11.4* 08/15/2015   HCT 35.1* 08/15/2015   MCV 82.0 08/15/2015   PLT 259 08/15/2015      Chemistry      Component Value Date/Time   NA 140 08/15/2015 0931   NA 137 08/01/2015 0500   K 4.4 08/15/2015 0931   K 3.8 08/01/2015 0500   CL 103 08/01/2015 0500   CO2 25 08/15/2015 0931   CO2 28 08/01/2015 0500   BUN 25.7 08/15/2015 0931   BUN 19 08/01/2015 0500   CREATININE 1.1 08/15/2015 0931   CREATININE 0.68 08/01/2015 0500   CREATININE 0.83 02/22/2014 1314      Component Value Date/Time   CALCIUM 8.6 08/15/2015 0931   CALCIUM 8.0* 08/01/2015 0500   ALKPHOS 61 08/15/2015 0931   ALKPHOS 45 08/01/2015 0500   AST 19 08/15/2015 0931   AST 19 08/01/2015 0500   ALT 21 08/15/2015 0931   ALT 20 08/01/2015 0500   BILITOT 0.40 08/15/2015 0931   BILITOT 1.2 08/01/2015 0500       RADIOGRAPHIC STUDIES: Dg Chest 2 View  07/31/2015   CLINICAL DATA:  Weakness, altered mental status and shortness of breath today. History of metastatic lung carcinoma. Subsequent encounter.  EXAM: CHEST  2 VIEW  COMPARISON:  CT chest, abdomen and pelvis 07/07/2015. Single view of the chest 03/31/2015.  FINDINGS: Port-A-Cath remains in place, unchanged. Small left pleural effusion is identified as on prior studies. No consolidative process is seen. The lungs are emphysematous. Small pulmonary nodules seen on the prior CT scan are not well demonstrated on this examination. Heart size is normal. No pneumothorax.  IMPRESSION: No acute finding.  Small left pleural effusion, unchanged.  Known bilateral pulmonary nodules are not well demonstrated on plain films.   Electronically Signed   By: Inge Rise M.D.   On: 07/31/2015 15:58   ASSESSMENT AND PLAN: this is a very pleasant 74 years old white male with:  1) Stage IV non-small cell lung cancer, adenocarcinoma presenting with  large right upper lobe lung mass in addition to mediastinal and bilateral hilar lymphadenopathy as well as cervical lymphadenopathy and left pleural effusion. The patient completed a course systemic chemotherapy with carboplatin and Alimta status post 6 cycles. This was followed by 3 cycles of maintenance chemotherapy with single agent Alimta. The patient tolerated his treatment well. He had evidence for disease progression and the patient was started on treatment  again with carboplatin and Alimta status post 6 cycles. He was treated with 4 cycles of immunotherapy with Nivolumab discontinued secondary to disease progression. The patient is currently on systemic chemotherapy with docetaxel and Cyramza status post 1 cycle and tolerated the treatment well except for severe neutropenia. He will continue with his treatment as a scheduled but I will reduce the dose of docetaxel to 60 MG/M2 and the patient will continue on prophylactic Neulasta.  2) Atrial fibrillation: continue Eliquis.  3) lower extremity edema: Improved. He will continue on Demadex but every other day.  The patient would come back for follow-up visit in 3 weeks for reevaluation and management of any adverse effect of his treatment before starting cycle #3. He was advised to call immediately if he has any concerning symptoms in the interval.  The patient voices understanding of current disease status and treatment options and is in agreement with the current care plan.  All questions were answered. The patient knows to call the clinic with any problems, questions or concerns. We can certainly see the patient much sooner if necessary.  Disclaimer: This note was dictated with voice recognition software. Similar sounding words can inadvertently be transcribed and may not be corrected upon review.

## 2015-08-15 NOTE — Patient Instructions (Signed)

## 2015-08-16 ENCOUNTER — Encounter: Payer: Self-pay | Admitting: Podiatry

## 2015-08-16 ENCOUNTER — Ambulatory Visit (INDEPENDENT_AMBULATORY_CARE_PROVIDER_SITE_OTHER): Payer: Medicare Other | Admitting: Podiatry

## 2015-08-16 VITALS — BP 128/67 | HR 75 | Resp 16

## 2015-08-16 DIAGNOSIS — M79676 Pain in unspecified toe(s): Secondary | ICD-10-CM | POA: Diagnosis not present

## 2015-08-16 DIAGNOSIS — B351 Tinea unguium: Secondary | ICD-10-CM | POA: Diagnosis not present

## 2015-08-16 DIAGNOSIS — R609 Edema, unspecified: Secondary | ICD-10-CM

## 2015-08-16 DIAGNOSIS — E1149 Type 2 diabetes mellitus with other diabetic neurological complication: Secondary | ICD-10-CM

## 2015-08-16 DIAGNOSIS — E1140 Type 2 diabetes mellitus with diabetic neuropathy, unspecified: Secondary | ICD-10-CM

## 2015-08-16 NOTE — Progress Notes (Signed)
   Subjective:    Patient ID: Shawn Espinoza, male    DOB: [birth year], 74 y.o.   MRN: [id number]  HPI Comments: "I just need to get my toenails cut"  Patient states he can't get down to cut his toenails anymore. He does have neuropathy and some swelling in his feet. His feet are swollen.  He is diabetic and under cancer treatmen      Review of Systems  All other systems reviewed and are negative.      Objective:   Physical Exam GENERAL APPEARANCE: Alert, conversant. Appropriately groomed. No acute distress.  VASCULAR: Pedal pulses  Not palpable due to swelling feet. bilateral.  Capillary refill time is immediate to all digits,  Cold feet noted.   NEUROLOGIC: sensation is intact epicritically and protectively to 5.07 monofilament at 2/5 sites bilateral.  Light touch is intact bilateral, vibratory sensation decreased bilateral, achilles tendon reflex is intact bilateral.  MUSCULOSKELETAL: acceptable muscle strength, tone and stability bilateral.  Intrinsic muscluature intact bilateral.    DERMATOLOGIC: skin color, texture, and turger are within normal limits.  No preulcerative lesions are seen, no interdigital maceration noted.  No open lesions present.  Digital nails are symptomatic with yellow brown discoloration present with thickening of nail plates and mycotic appearance x 10.     Assessment & Plan:  Diabetes with neuropathy, symptomatic mycotic toenails.  Plan:  Diabetic foot evaluation carried out.  Symptomatic nails debrided without complication.  Recommend routine pallative care and foot checks every 3 months.

## 2015-08-17 ENCOUNTER — Ambulatory Visit: Payer: Medicare Other | Admitting: Family Medicine

## 2015-08-17 ENCOUNTER — Ambulatory Visit (HOSPITAL_BASED_OUTPATIENT_CLINIC_OR_DEPARTMENT_OTHER): Payer: Medicare Other

## 2015-08-17 VITALS — BP 112/77 | HR 68 | Temp 97.5°F | Resp 24

## 2015-08-17 DIAGNOSIS — C3492 Malignant neoplasm of unspecified part of left bronchus or lung: Secondary | ICD-10-CM

## 2015-08-17 DIAGNOSIS — C3412 Malignant neoplasm of upper lobe, left bronchus or lung: Secondary | ICD-10-CM

## 2015-08-17 DIAGNOSIS — Z5189 Encounter for other specified aftercare: Secondary | ICD-10-CM | POA: Diagnosis not present

## 2015-08-17 MED ORDER — PEGFILGRASTIM INJECTION 6 MG/0.6ML ~~LOC~~
6.0000 mg | PREFILLED_SYRINGE | Freq: Once | SUBCUTANEOUS | Status: AC
  Administered 2015-08-17: 6 mg via SUBCUTANEOUS
  Filled 2015-08-17: qty 0.6

## 2015-08-17 NOTE — Progress Notes (Signed)
Shawn Espinoza continues to have SOB on exertion, doesn't have any problem when sitting around not doing anything.  When he first gets up to walk.  O2 Sat 100%.  States that he is going to see his PCP tomorrow.  Knows to call 911 if it get worse

## 2015-08-18 ENCOUNTER — Encounter: Payer: Self-pay | Admitting: Family Medicine

## 2015-08-18 ENCOUNTER — Ambulatory Visit (INDEPENDENT_AMBULATORY_CARE_PROVIDER_SITE_OTHER): Payer: Medicare Other | Admitting: Family Medicine

## 2015-08-18 VITALS — BP 100/70 | HR 85 | Temp 97.8°F | Wt 179.0 lb

## 2015-08-18 DIAGNOSIS — C3492 Malignant neoplasm of unspecified part of left bronchus or lung: Secondary | ICD-10-CM | POA: Diagnosis not present

## 2015-08-18 DIAGNOSIS — T380X5A Adverse effect of glucocorticoids and synthetic analogues, initial encounter: Secondary | ICD-10-CM

## 2015-08-18 DIAGNOSIS — D702 Other drug-induced agranulocytosis: Secondary | ICD-10-CM

## 2015-08-18 DIAGNOSIS — R0609 Other forms of dyspnea: Secondary | ICD-10-CM

## 2015-08-18 DIAGNOSIS — R600 Localized edema: Secondary | ICD-10-CM

## 2015-08-18 DIAGNOSIS — E099 Drug or chemical induced diabetes mellitus without complications: Secondary | ICD-10-CM

## 2015-08-18 DIAGNOSIS — R609 Edema, unspecified: Secondary | ICD-10-CM | POA: Diagnosis not present

## 2015-08-18 DIAGNOSIS — M79604 Pain in right leg: Secondary | ICD-10-CM

## 2015-08-18 DIAGNOSIS — I4891 Unspecified atrial fibrillation: Secondary | ICD-10-CM

## 2015-08-18 DIAGNOSIS — M79605 Pain in left leg: Secondary | ICD-10-CM

## 2015-08-18 LAB — BASIC METABOLIC PANEL
BUN: 26 mg/dL — ABNORMAL HIGH (ref 6–23)
CALCIUM: 8.3 mg/dL — AB (ref 8.4–10.5)
CO2: 28 meq/L (ref 19–32)
Chloride: 106 mEq/L (ref 96–112)
Creatinine, Ser: 0.81 mg/dL (ref 0.40–1.50)
GFR: 98.8 mL/min (ref >60.00)
Glucose, Bld: 94 mg/dL (ref 70–99)
POTASSIUM: 4.3 meq/L (ref 3.5–5.1)
SODIUM: 141 meq/L (ref 135–145)

## 2015-08-18 LAB — CBC WITH DIFFERENTIAL/PLATELET
BASOS ABS: 0 10*3/uL (ref 0.0–0.1)
Basophils Relative: 0.1 % (ref 0.0–3.0)
EOS PCT: 0 % (ref 0.0–5.0)
Eosinophils Absolute: 0 10*3/uL (ref 0.0–0.7)
HEMATOCRIT: 37.9 % — AB (ref 39.0–52.0)
Hemoglobin: 12.2 g/dL — ABNORMAL LOW (ref 13.0–17.0)
LYMPHS PCT: 3.8 % — AB (ref 12.0–46.0)
Lymphs Abs: 0.5 10*3/uL — ABNORMAL LOW (ref 0.7–4.0)
MCHC: 32.1 g/dL (ref 30.0–36.0)
MCV: 81.4 fl (ref 78.0–100.0)
MONOS PCT: 0.1 % — AB (ref 3.0–12.0)
Monocytes Absolute: 0 10*3/uL — ABNORMAL LOW (ref 0.1–1.0)
NEUTROS ABS: 11.5 10*3/uL — AB (ref 1.4–7.7)
Neutrophils Relative %: 96 % — ABNORMAL HIGH (ref 43.0–77.0)
Platelets: 228 10*3/uL (ref 150.0–400.0)
RBC: 4.66 Mil/uL (ref 4.22–5.81)
RDW: 17.5 % — ABNORMAL HIGH (ref 11.5–15.5)
WBC: 12 10*3/uL — ABNORMAL HIGH (ref 4.0–10.5)

## 2015-08-18 LAB — BRAIN NATRIURETIC PEPTIDE: Pro B Natriuretic peptide (BNP): 222 pg/mL — ABNORMAL HIGH (ref 0.0–100.0)

## 2015-08-18 NOTE — Assessment & Plan Note (Addendum)
Progressive and worsening over last few days s/p 2nd chemo cycle received 3 d ago. No fever. Presented with O2 sat 90% after ambulation, not currently using home oxygen. Will recommend restart oxygen he already has set up at home. Lungs clear today, improved pedal edema - doubt CHF related or worsening pleural effusions, doubt infection. Check BNP, BMP and CBC to further eval dyspnea. To ER if fever or worsening dyspnea or cough over weekend. Pt/wife agree with plan.

## 2015-08-18 NOTE — Assessment & Plan Note (Addendum)
Marked improvement off prior chemo regimen. Continue lower torsemide dose ('20mg'$  QOD)

## 2015-08-18 NOTE — Assessment & Plan Note (Signed)
Higher dose gabapentin ('300mg'$  nightly) intolerable. Doing well with tramadol prn.

## 2015-08-18 NOTE — Assessment & Plan Note (Signed)
Continue amaryl '1mg'$  daily with 2nd mg if takes steroids.

## 2015-08-18 NOTE — Progress Notes (Signed)
Pre visit review using our clinic review tool, if applicable. No additional management support is needed unless otherwise documented below in the visit note. 

## 2015-08-18 NOTE — Patient Instructions (Signed)
labwork today Try more regular use of oxygen at home. If fever or worsening please seek urgent care over the weekend.

## 2015-08-18 NOTE — Assessment & Plan Note (Signed)
Appreciate ongoing onc care of patient. Now on taxotere/cyramza + neulasta.

## 2015-08-18 NOTE — Assessment & Plan Note (Addendum)
Possible UTI treated with levaquin, has completed course. WBC nadir to 0.7 earlier this month, latest WBC 8. Now on neutrophil colony stimulating agent.

## 2015-08-18 NOTE — Assessment & Plan Note (Signed)
Sounds regular today.  ?

## 2015-08-18 NOTE — Progress Notes (Signed)
BP 100/70 mmHg  Pulse 85  Temp(Src) 97.8 F (36.6 C) (Tympanic)  Wt 179 lb (81.194 kg)  SpO2 90%   CC: hosp f/u visit  Subjective:    Patient ID: Shawn Espinoza, male    DOB: August 04, 1941, 74 y.o.   MRN: [id number]  HPI: Shawn Espinoza is a 74 y.o. male presenting on 08/18/2015 for Hospitalization Follow-up   Presents with wife today.  Recent hospitalization for malaise and chemo induced neutropenia, treated with levaquin. Lung cancer (non small cell) stage IIIB/IV dx 02/2014 - on chemo infusion (docetaxel + ramuciromab). No ppx neulasta colony stimulating factor. Received 2nd cycle on Wednesday. Persistent and worsening dyspnea on exertion, worsening cough worse at night time productive of white foamy phlegm. + chills. No fevers.   Has supplemental O2 at home but does not regularly use. Today on presentation O2 sat 90% off oxygen. Rises to 98% on 2L [place].   Leg pain/swelling - gabapentin 1100m may have helped but higher dose 3032mnightly was too sedating so pt stopped. Takes torsemide 2064mid.   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  Admit date: 07/31/2015 Discharge date: 08/03/2015 F/u phone call: [phone]/12/12016  Recommendations for Outpatient Follow-up:  1. Folllow up with Dr. MohaJulien Nordmann5 days as scheduled  Discharge Diagnoses:  Principal Problem:  Neutropenia, drug-induced Active Problems:  Steroid-induced diabetes mellitus  Essential hypertension  Hemorrhoids  Adenocarcinoma of left lung, stage 4  Discharge Condition: stable Diet recommendation: regular  Relevant past medical, surgical, family and social history reviewed and updated as indicated. Interim medical history since our last visit reviewed. Allergies and medications reviewed and updated. Current Outpatient  Prescriptions on File Prior to Visit  Medication Sig  . albuterol (PROVENTIL HFA;VENTOLIN HFA) 108 (90 BASE) MCG/ACT inhaler Inhale 2 puffs into the lungs every 6 (six) hours as needed for wheezing or shortness of breath.  . Alum & Mag Hydroxide-Simeth (MAGIC MOUTHWASH W/LIDOCAINE) SOLN Take 5 mLs by mouth 3 (three) times daily as needed for mouth pain.  . apMarland Kitchenxaban (ELIQUIS) 5 MG TABS tablet Take 1 tablet (5 mg total) by mouth 2 (two) times daily.  . deMarland Kitchenamethasone (DECADRON) 4 MG tablet 2 tab po bid, the day before, day of and day after chemotherapy every 3 weeks  . diphenoxylate-atropine (LOMOTIL) 2.5-0.025 MG per tablet Take 2 tablets by mouth 4 (four) times daily as needed for diarrhea or loose stools.  . folic acid (FOLVITE) 1 MG tablet TAKE 1 TABLET BY MOUTH EVERY DAY  . glimepiride (AMARYL) 1 MG tablet Take 1 mg by mouth daily with breakfast.  . hydrocortisone (ANUSOL-HC) 25 MG suppository Place 1 suppository (25 mg total) rectally 2 (two) times daily.  . keMarland Kitchenorolac (ACULAR) 0.5 % ophthalmic solution PLACE 1 DROP IN LEFT EYE 4 TIMES A DAY  . KLOR-CON M10 10 MEQ tablet Take 20 mEq by mouth daily as needed (with torsemide).   . liMarland Kitchenocaine-prilocaine (EMLA) cream Apply 1 application topically as needed.  . loratadine (CLARITIN) 10 MG tablet Take 10 mg by mouth as directed. 3 days before chemo and 4 days after  . metoprolol tartrate (LOPRESSOR) 25 MG tablet Take 1 tablet (25 mg total) by mouth 2 (two) times daily.  . Multiple Vitamins-Minerals (ICAPS) CAPS Take 1 capsule by mouth  2 (two) times daily.   . polyethylene glycol (MIRALAX / GLYCOLAX) packet Take 8.5 g by mouth daily as needed for moderate constipation.   . sucralfate (CARAFATE) 1 G tablet Take 1 tablet (1 g total) by mouth 4 (four) times daily -  with meals and at bedtime.  . traMADol (ULTRAM) 50 MG tablet Take 1 tablet (50 mg total) by mouth every 6 (six) hours as needed.   No current facility-administered medications on file prior to  visit.    Review of Systems Per HPI unless specifically indicated above     Objective:    BP 100/70 mmHg  Pulse 85  Temp(Src) 97.8 F (36.6 C) (Tympanic)  Wt 179 lb (81.194 kg)  SpO2 90%  Wt Readings from Last 3 Encounters:  08/18/15 179 lb (81.194 kg)  08/15/15 173 lb 12.8 oz (78.835 kg)  08/02/15 180 lb 6.4 oz (81.829 kg)    Physical Exam  Constitutional: He appears well-developed and well-nourished. No distress.  HENT:  Mouth/Throat: Oropharynx is clear and moist. No oropharyngeal exudate.  Neck: Normal range of motion. Neck supple.  Cardiovascular: Normal rate, regular rhythm, normal heart sounds and intact distal pulses.   No murmur heard. Pulmonary/Chest: Effort normal and breath sounds normal. No respiratory distress. He has no wheezes. He has no rales.  Lungs clear today  Musculoskeletal: He exhibits edema (nonpitting, woody BLE).  Skin: Skin is warm and dry. No rash noted.  Psychiatric: He has a normal mood and affect.  Nursing note and vitals reviewed.  Results for orders placed or performed in visit on 08/15/15  CBC with Differential  Result Value Ref Range   WBC 8.0 4.0 - 10.3 10e3/uL   NEUT# 7.4 (H) 1.5 - 6.5 10e3/uL   HGB 11.4 (L) 13.0 - 17.1 g/dL   HCT 35.1 (L) 38.4 - 49.9 %   Platelets 259 140 - 400 10e3/uL   MCV 82.0 79.3 - 98.0 fL   MCH 26.6 (L) 27.2 - 33.4 pg   MCHC 32.5 32.0 - 36.0 g/dL   RBC 4.28 4.20 - 5.82 10e6/uL   RDW 15.7 (H) 11.0 - 14.6 %   lymph# 0.3 (L) 0.9 - 3.3 10e3/uL   MONO# 0.3 0.1 - 0.9 10e3/uL   Eosinophils Absolute 0.0 0.0 - 0.5 10e3/uL   Basophils Absolute 0.0 0.0 - 0.1 10e3/uL   NEUT% 92.4 (H) 39.0 - 75.0 %   LYMPH% 4.3 (L) 14.0 - 49.0 %   MONO% 3.3 0.0 - 14.0 %   EOS% 0.0 0.0 - 7.0 %   BASO% 0.0 0.0 - 2.0 %  Comprehensive metabolic panel  Result Value Ref Range   Sodium 140 136 - 145 mEq/L   Potassium 4.4 3.5 - 5.1 mEq/L   Chloride 105 98 - 109 mEq/L   CO2 25 22 - 29 mEq/L   Glucose 308 (H) 70 - 140 mg/dl   BUN  25.7 7.0 - 26.0 mg/dL   Creatinine 1.1 0.7 - 1.3 mg/dL   Total Bilirubin 0.40 0.20 - 1.20 mg/dL   Alkaline Phosphatase 61 40 - 150 U/L   AST 19 5 - 34 U/L   ALT 21 0 - 55 U/L   Total Protein 5.5 (L) 6.4 - 8.3 g/dL   Albumin 2.4 (L) 3.5 - 5.0 g/dL   Calcium 8.6 8.4 - 10.4 mg/dL   Anion Gap 11 3 - 11 mEq/L   EGFR 68 (L) >90 ml/min/1.73 m2      Assessment & Plan:   Problem  List Items Addressed This Visit    Steroid-induced diabetes mellitus    Continue amaryl 100m daily with 2nd mg if takes steroids.      DOE (dyspnea on exertion) - Primary    Progressive and worsening over last few days s/p 2nd chemo cycle received 3 d ago. No fever. Presented with O2 sat 90% after ambulation, not currently using home oxygen. Will recommend restart oxygen he already has set up at home. Lungs clear today, improved pedal edema - doubt CHF related or worsening pleural effusions, doubt infection. Check BNP, BMP and CBC to further eval dyspnea. To ER if fever or worsening dyspnea or cough over weekend. Pt/wife agree with plan.      Relevant Orders   Basic metabolic panel   CBC with Differential/Platelet   Brain natriuretic peptide   Adenocarcinoma of left lung, stage 4    Appreciate ongoing onc care of patient. Now on taxotere/cyramza + neulasta.       Relevant Orders   Basic metabolic panel   CBC with Differential/Platelet   Brain natriuretic peptide   Peripheral edema    Marked improvement off prior chemo regimen. Continue lower torsemide dose (254mQOD)      Atrial fibrillation, unspecified    Sounds regular today.      Relevant Medications   torsemide (DEMADEX) 20 MG tablet   Bilateral leg pain    Higher dose gabapentin (30031mightly) intolerable. Doing well with tramadol prn.      Neutropenia, drug-induced    Possible UTI treated with levaquin, has completed course. WBC nadir to 0.7 earlier this month, latest WBC 8. Now on neutrophil colony stimulating agent.          Follow  up plan: Return in about 3 months (around 11/18/2015), or if symptoms worsen or fail to improve, for follow up visit.

## 2015-08-21 ENCOUNTER — Telehealth: Payer: Self-pay | Admitting: Medical Oncology

## 2015-08-21 NOTE — Telephone Encounter (Signed)
Temp 98.7 and pulse oxygen is 97%. He got up and ate jello.He is not having chills or shakes now. I instructed [name] to keep his appt tomorrow or call 911 today  if his symptoms recur. She would like a [facility] appt tomorrow.

## 2015-08-21 NOTE — Telephone Encounter (Signed)
Chemo on 08/15/15  and neulasta on 08/17/15. Wife said she will take temperature and call me back. She is concerned his WBC may be low

## 2015-08-22 ENCOUNTER — Encounter: Payer: Self-pay | Admitting: Nurse Practitioner

## 2015-08-22 ENCOUNTER — Other Ambulatory Visit (HOSPITAL_BASED_OUTPATIENT_CLINIC_OR_DEPARTMENT_OTHER): Payer: Medicare Other

## 2015-08-22 ENCOUNTER — Ambulatory Visit (HOSPITAL_BASED_OUTPATIENT_CLINIC_OR_DEPARTMENT_OTHER): Payer: Medicare Other | Admitting: Nurse Practitioner

## 2015-08-22 VITALS — BP 119/73 | HR 102 | Temp 97.6°F | Resp 19 | Ht 69.0 in | Wt 174.0 lb

## 2015-08-22 DIAGNOSIS — B379 Candidiasis, unspecified: Secondary | ICD-10-CM

## 2015-08-22 DIAGNOSIS — R040 Epistaxis: Secondary | ICD-10-CM | POA: Diagnosis not present

## 2015-08-22 DIAGNOSIS — B370 Candidal stomatitis: Secondary | ICD-10-CM

## 2015-08-22 DIAGNOSIS — E860 Dehydration: Secondary | ICD-10-CM | POA: Insufficient documentation

## 2015-08-22 DIAGNOSIS — R0609 Other forms of dyspnea: Secondary | ICD-10-CM

## 2015-08-22 DIAGNOSIS — C3412 Malignant neoplasm of upper lobe, left bronchus or lung: Secondary | ICD-10-CM | POA: Diagnosis not present

## 2015-08-22 DIAGNOSIS — Z7901 Long term (current) use of anticoagulants: Secondary | ICD-10-CM | POA: Diagnosis not present

## 2015-08-22 DIAGNOSIS — C3492 Malignant neoplasm of unspecified part of left bronchus or lung: Secondary | ICD-10-CM

## 2015-08-22 DIAGNOSIS — R530 Neoplastic (malignant) related fatigue: Secondary | ICD-10-CM | POA: Diagnosis not present

## 2015-08-22 DIAGNOSIS — D709 Neutropenia, unspecified: Secondary | ICD-10-CM

## 2015-08-22 DIAGNOSIS — R630 Anorexia: Secondary | ICD-10-CM | POA: Diagnosis not present

## 2015-08-22 LAB — COMPREHENSIVE METABOLIC PANEL (CC13)
ALT: 21 U/L (ref 0–55)
ANION GAP: 12 meq/L — AB (ref 3–11)
AST: 19 U/L (ref 5–34)
Albumin: 2.6 g/dL — ABNORMAL LOW (ref 3.5–5.0)
Alkaline Phosphatase: 70 U/L (ref 40–150)
BUN: 16.8 mg/dL (ref 7.0–26.0)
CHLORIDE: 103 meq/L (ref 98–109)
CO2: 24 meq/L (ref 22–29)
CREATININE: 1 mg/dL (ref 0.7–1.3)
Calcium: 8.6 mg/dL (ref 8.4–10.4)
EGFR: 72 mL/min/{1.73_m2} — ABNORMAL LOW (ref >90)
Glucose: 135 mg/dl (ref 70–140)
Potassium: 4.4 mEq/L (ref 3.5–5.1)
SODIUM: 139 meq/L (ref 136–145)
Total Bilirubin: 1.75 mg/dL — ABNORMAL HIGH (ref 0.20–1.20)
Total Protein: 5.8 g/dL — ABNORMAL LOW (ref 6.4–8.3)

## 2015-08-22 LAB — CBC WITH DIFFERENTIAL/PLATELET
BASO%: 0.9 % (ref 0.0–2.0)
Basophils Absolute: 0 10*3/uL (ref 0.0–0.1)
EOS%: 0.4 % (ref 0.0–7.0)
Eosinophils Absolute: 0 10*3/uL (ref 0.0–0.5)
HCT: 36.6 % — ABNORMAL LOW (ref 38.4–49.9)
HGB: 11.7 g/dL — ABNORMAL LOW (ref 13.0–17.1)
LYMPH%: 37.7 % (ref 14.0–49.0)
MCH: 26.4 pg — ABNORMAL LOW (ref 27.2–33.4)
MCHC: 32 g/dL (ref 32.0–36.0)
MCV: 82.6 fL (ref 79.3–98.0)
MONO#: 0.6 10*3/uL (ref 0.1–0.9)
MONO%: 28.3 % — AB (ref 0.0–14.0)
NEUT%: 32.7 % — ABNORMAL LOW (ref 39.0–75.0)
NEUTROS ABS: 0.7 10*3/uL — AB (ref 1.5–6.5)
PLATELETS: 134 10*3/uL — AB (ref 140–400)
RBC: 4.43 10*6/uL (ref 4.20–5.82)
RDW: 16.2 % — ABNORMAL HIGH (ref 11.0–14.6)
WBC: 2.2 10*3/uL — AB (ref 4.0–10.3)
lymph#: 0.8 10*3/uL — ABNORMAL LOW (ref 0.9–3.3)
nRBC: 2 % — ABNORMAL HIGH (ref 0–0)

## 2015-08-22 MED ORDER — FLUCONAZOLE 100 MG PO TABS
100.0000 mg | ORAL_TABLET | Freq: Every day | ORAL | Status: DC
Start: 2015-08-22 — End: 2015-10-16

## 2015-08-22 NOTE — Assessment & Plan Note (Signed)
Patient has a mild white coating to his tongue.  He is also complaining of increased sensitivity to his entire mouth when eating specific foods.  Patient also states that he has Magic mouthwash with lidocaine at home to use on a regular basis.  Will prescribe Diflucan 100 mg to take on a daily basis for treatment of mild thrush.

## 2015-08-22 NOTE — Progress Notes (Signed)
SYMPTOM MANAGEMENT CLINIC   HPI: Shawn Espinoza 74 y.o. male diagnosed with lung cancer.  Currently undergoing Taxotere and Cyramza chemotherapy.  Patient received his last cycle of Taxotere/cyramza chemotherapy on 08/15/2015.  He received Neulasta for growth factor support following his chemo.  Patient was admitted to the hospital approx 2 -3 weeks ago for neutropenia; and increased pleural effusions.  He completed all of his antibiotics following discharge.   Patient continues to complain of progressive fatigue/weakness, and increased dyspnea as well.  He has been having to use home oxygen on a more regular basis; but dislikes using it.  He feels further dehydrated today as well.  He denies any recent fevers; but does complain of some intermittent significant chilling.  Both patient and his wife report that patient is literally living in his bedroom now; and only occasionally walking about the house.  When questioned-patient states that he becomes increasingly dyspneic with any ambulation whatsoever.  He feels slightly unsteady on his feet.  Patient states that it is too difficult for him to turn his oxygen concentrator on an use his oxygen via nasal cannula.  HPI  ROS  Past Medical History  Diagnosis Date  . History of diabetes mellitus, type II     resolved with diet, h/o neuropathy  . Diverticulosis of colon   . Hyperlipidemia   . Hypertension   . Fatty liver 02/29/00    abd ultrasound  . Lower back pain   . Systolic murmur 8182    2Decho - normal LV fxn, EF 55%, mild AS, biatrial enlargement  . History of tobacco use quit 1990s  . 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 Personal history of colonic adenomas 07/06/2013  . Hypertensive retinopathy of both eyes 2015    mild  . Dysrhythmia     Atrial fib (found 07/2014)  . Shortness of breath   . Pneumonia   .  GERD (gastroesophageal reflux disease)     occasional  . Arthritis   . Neuropathy   . Constipation   . 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 Diabetes mellitus without complication     Type 2  . Depression   . 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 Colonoscopy  2004  . Colonoscopy  2014    tubular adenoma x1, mod diverticulosis ([place])  . Flexible bronchoscopy  02/2014    WNL  . Video bronchoscopy Bilateral 03/17/2014    Procedure: VIDEO BRONCHOSCOPY WITH FLUORO;  Surgeon: Tanda Rockers, MD;  Location: [facility];  Service: Cardiopulmonary;  Laterality: Bilateral;  . Chest tube insertion Left 08/26/2014    Procedure: INSERTION OF LEFT  PLEURAL DRAINAGE CATHETER;  Surgeon: Ivin Poot, MD;  Location: [facility];  Service: Thoracic;  Laterality: Left;  . Cataract extraction Right 03/2015    Dr [name]  . Portacath placement Right 03/31/2015    Procedure: INSERTION PORT-A-CATH;  Surgeon: Ivin Poot, MD;  Location: [facility];  Service: Thoracic;  Laterality: Right;  . Removal of pleural drainage catheter Left 03/31/2015    Procedure: REMOVAL OF PLEURAL DRAINAGE CATHETER;  Surgeon: Ivin Poot, MD;  Location: [facility];  Service: Thoracic;  Laterality: Left;    has  Steroid-induced diabetes mellitus; HLD (hyperlipidemia); History of tobacco use; Essential hypertension; Hemorrhoids; DIVERTICULOSIS, COLON; FATTY LIVER DISEASE; Medicare annual wellness visit, subsequent; Positive hepatitis C antibody test; Systolic murmur; Lower back pain; Polycythemia; BPH (benign prostatic hypertrophy); Right knee pain; Personal history of colonic adenoma; DOE (dyspnea on exertion); Adenocarcinoma of left lung, stage 4; Liver lesion; Recurrent left pleural effusion; Skin rash; Other pancytopenia; Interstitial pneumonitis; CHF (congestive heart failure); Thrombocytopenia; Peripheral edema;  Protein-calorie malnutrition; Orthostatic hypotension; Neoplastic malignant related fatigue; Atrial fibrillation, unspecified; Hypothyroid; Thrush; Hearing loss; Advanced care planning/counseling discussion; Antineoplastic chemotherapy induced pancytopenia; Bilateral leg pain; Encounter for antineoplastic chemotherapy; Myalgia; Neutropenia, drug-induced; Altered mental state; Constipation; Long term current use of anticoagulant therapy; Esophagitis; Epistaxis; and Dehydration on his problem list.    is allergic to candesartan cilexetil; diltiazem hcl; doxazosin mesylate; nifedipine; pravastatin; red yeast rice; rosuvastatin; sertraline; simvastatin; and hydrocodone.    Medication List       This list is accurate as of: 08/22/15  6:08 PM.  Always use your most recent med list.               albuterol 108 (90 BASE) MCG/ACT inhaler  Commonly known as:  PROVENTIL HFA;VENTOLIN HFA  Inhale 2 puffs into the lungs every 6 (six) hours as needed for wheezing or shortness of breath.     apixaban 5 MG Tabs tablet  Commonly known as:  ELIQUIS  Take 1 tablet (5 mg total) by mouth 2 (two) times daily.     dexamethasone 4 MG tablet  Commonly known as:  DECADRON  2 tab po bid, the day before, day of and day after chemotherapy every 3 weeks     diphenoxylate-atropine 2.5-0.025 MG per tablet  Commonly known as:  LOMOTIL  Take 2 tablets by mouth 4 (four) times daily as needed for diarrhea or loose stools.     fluconazole 100 MG tablet  Commonly known as:  DIFLUCAN  Take 1 tablet (100 mg total) by mouth daily.     folic acid 1 MG tablet  Commonly known as:  FOLVITE  TAKE 1 TABLET BY MOUTH EVERY DAY     glimepiride 1 MG tablet  Commonly known as:  AMARYL  Take 1 mg by mouth daily with breakfast.     guaiFENesin 600 MG 12 hr tablet  Commonly known as:  MUCINEX  Take 600 mg by mouth 2 (two) times daily as needed.     hydrocortisone 25 MG suppository  Commonly known as:  ANUSOL-HC  Place 1  suppository (25 mg total) rectally 2 (two) times daily.     ICAPS Caps  Take 1 capsule by mouth 2 (two) times daily.     ketorolac 0.5 % ophthalmic solution  Commonly known as:  ACULAR  PLACE 1 DROP IN LEFT EYE 4 TIMES A DAY     KLOR-CON M10 10 MEQ tablet  Generic drug:  potassium chloride  Take 20 mEq by mouth daily as needed (with torsemide).     lidocaine-prilocaine cream  Commonly known as:  EMLA  Apply 1 application topically as needed.     loratadine 10 MG tablet  Commonly known as:  CLARITIN  Take 10 mg by mouth as directed. 3 days before chemo and 4 days after     magic mouthwash w/lidocaine Soln  Take 5 mLs by mouth 3 (three) times daily as needed for mouth pain.     metoprolol tartrate 25 MG tablet  Commonly known as:  LOPRESSOR  Take 1 tablet (  25 mg total) by mouth 2 (two) times daily.     polyethylene glycol packet  Commonly known as:  MIRALAX / GLYCOLAX  Take 8.5 g by mouth daily as needed for moderate constipation.     sucralfate 1 G tablet  Commonly known as:  CARAFATE  Take 1 tablet (1 g total) by mouth 4 (four) times daily -  with meals and at bedtime.     torsemide 20 MG tablet  Commonly known as:  DEMADEX  Take 1 tablet (20 mg total) by mouth every other day.     traMADol 50 MG tablet  Commonly known as:  ULTRAM  Take 1 tablet (50 mg total) by mouth every 6 (six) hours as needed.         PHYSICAL EXAMINATION  Oncology Vitals 08/22/2015 08/18/2015 08/17/2015 08/16/2015 08/15/2015 08/08/2015 08/03/2015  Height 175 cm - - - 175 cm - -  Weight 78.926 kg 81.194 kg - - 78.835 kg - -  Weight (lbs) 174 lbs 179 lbs - - 173 lbs 13 oz - -  BMI (kg/m2) 25.7 kg/m2 - - - 25.67 kg/m2 - -  Temp 97.6 97.8 97.5 - - 98 98.6  Pulse 102 85 68 75 77 93 105  Resp 19 - 24 16 19 24 16   SpO2 85 90 100 - 99 94 98  BSA (m2) 1.96 m2 - - - 1.96 m2 - -   BP Readings from Last 3 Encounters:  08/22/15 119/73  08/18/15 100/70  08/17/15 112/77    Physical Exam    Constitutional: He is oriented to person, place, and time. Vital signs are normal. He appears malnourished and dehydrated. He appears unhealthy. He appears cachectic.  HENT:  Head: Normocephalic and atraumatic.  Thin white coating to tongue.  No obvious oral lesions noted.  Eyes: Conjunctivae and EOM are normal. Pupils are equal, round, and reactive to light. Right eye exhibits no discharge. Left eye exhibits no discharge. No scleral icterus.  Neck: Normal range of motion. Neck supple.  Pulmonary/Chest: Effort normal. No respiratory distress.  Musculoskeletal: Normal range of motion. He exhibits no edema or tenderness.  Neurological: He is alert and oriented to person, place, and time.  Skin: Skin is warm and dry. No rash noted. No erythema. There is pallor.  Psychiatric: Affect normal.  Nursing note and vitals reviewed.   LABORATORY DATA:. Appointment on 08/22/2015  Component Date Value Ref Range Status  . WBC 08/22/2015 2.2* 4.0 - 10.3 10e3/uL Final  . NEUT# 08/22/2015 0.7* 1.5 - 6.5 10e3/uL Final  . HGB 08/22/2015 11.7* 13.0 - 17.1 g/dL Final  . HCT 08/22/2015 36.6* 38.4 - 49.9 % Final  . Platelets 08/22/2015 134* 140 - 400 10e3/uL Final  . MCV 08/22/2015 82.6  79.3 - 98.0 fL Final  . MCH 08/22/2015 26.4* 27.2 - 33.4 pg Final  . MCHC 08/22/2015 32.0  32.0 - 36.0 g/dL Final  . RBC 08/22/2015 4.43  4.20 - 5.82 10e6/uL Final  . RDW 08/22/2015 16.2* 11.0 - 14.6 % Final  . lymph# 08/22/2015 0.8* 0.9 - 3.3 10e3/uL Final  . MONO# 08/22/2015 0.6  0.1 - 0.9 10e3/uL Final  . Eosinophils Absolute 08/22/2015 0.0  0.0 - 0.5 10e3/uL Final  . Basophils Absolute 08/22/2015 0.0  0.0 - 0.1 10e3/uL Final  . NEUT% 08/22/2015 32.7* 39.0 - 75.0 % Final  . LYMPH% 08/22/2015 37.7  14.0 - 49.0 % Final  . MONO% 08/22/2015 28.3* 0.0 - 14.0 % Final  . EOS% 08/22/2015  0.4  0.0 - 7.0 % Final  . BASO% 08/22/2015 0.9  0.0 - 2.0 % Final  . nRBC 08/22/2015 2* 0 - 0 % Final  . Sodium 08/22/2015 139  136 - 145  mEq/L Final  . Potassium 08/22/2015 4.4  3.5 - 5.1 mEq/L Final  . Chloride 08/22/2015 103  98 - 109 mEq/L Final  . CO2 08/22/2015 24  22 - 29 mEq/L Final  . Glucose 08/22/2015 135  70 - 140 mg/dl Final  . BUN 08/22/2015 16.8  7.0 - 26.0 mg/dL Final  . Creatinine 08/22/2015 1.0  0.7 - 1.3 mg/dL Final  . Total Bilirubin 08/22/2015 1.75* 0.20 - 1.20 mg/dL Final  . Alkaline Phosphatase 08/22/2015 70  40 - 150 U/L Final  . AST 08/22/2015 19  5 - 34 U/L Final  . ALT 08/22/2015 21  0 - 55 U/L Final  . Total Protein 08/22/2015 5.8* 6.4 - 8.3 g/dL Final  . Albumin 08/22/2015 2.6* 3.5 - 5.0 g/dL Final  . Calcium 08/22/2015 8.6  8.4 - 10.4 mg/dL Final  . Anion Gap 08/22/2015 12* 3 - 11 mEq/L Final  . EGFR 08/22/2015 72* >90 ml/min/1.73 m2 Final   eGFR is calculated using the CKD-EPI Creatinine Equation (2009)     RADIOGRAPHIC STUDIES: No results found.  ASSESSMENT/PLAN:    Adenocarcinoma of left lung, stage 4 Patient received his last cycle of Taxotere/cyramza chemotherapy on 08/15/2015.  He received Neulasta for growth factor support following his chemo.  Patient was admitted to the hospital approx 2 -3 weeks ago for neutropenia; and increased pleural effusions.  He completed all of his antibiotics following discharge.   Patient continues to complain of progressive fatigue/weakness, and increased dyspnea as well.  He has been having to use home oxygen on a more regular basis; but dislikes using it.  He feels further dehydrated today as well.  He denies any recent fevers; but does complain of some intermittent significant chilling.  On exam today.  Patient does appear increasingly fatigued and weak.  He also appears to be very pale.   Vital signs essentially stable; and patient is afebrile.  Labs obtained today did reveal a WBC of 2.2, ANC 0.7, hemoglobin 11.7, platelet count 134.  Patient is scheduled to return on 09/05/2015 for labs, visit, his next cycle of chemotherapy.  He will also  continue with weekly labs.          Dehydration Patient admits to decreased oral intake within the past few days; and feeling dehydrated.  However, patient refused IV fluid rehydration today.  He stated he would prefer to return home and push fluids is much as possible.  Patient and his wife were both advised that patient can call and receive IV fluid rehydration at any time.  Epistaxis Patient reports intermittent trace epistaxis.  Most likely, this is secondary to mild thrombocytopenia; and possibly due to dryness of nose when using his oxygen.  There was no obvious bleeding from the naris on exam.  Patient was advised to try using some Aquaphor to his nostrils to relieve dryness.  Long term current use of anticoagulant therapy Patient has history of atrial fib; and continues to take Eliquis on a daily basis.    Neoplastic malignant related fatigue Patient is complaining of increased fatigue and generalized weakness within the past few weeks.  Both patient and his wife report that patient is literally living in his bedroom now; and only occasionally walking about the house.  When questioned-patient states  that he becomes increasingly dyspneic with any ambulation whatsoever.  He feels slightly unsteady on his feet.  Patient states that it is too difficult for him to turn his oxygen concentrator on an use his oxygen via nasal cannula.  Advised patient that he should keep his oxygen concentrator on; and to place the nasal cannula in the bed with him.-So he can apply it prior to ambulating.  Long talk with both patient and his wife regarding the need to push protein and fluids at home.  Also, advised patient he needs to remain as active as possible; preferably walking about his home for 15-20 minutes every evening.  Thrush Patient has a mild white coating to his tongue.  He is also complaining of increased sensitivity to his entire mouth when eating specific foods.  Patient also  states that he has Magic mouthwash with lidocaine at home to use on a regular basis.  Will prescribe Diflucan 100 mg to take on a daily basis for treatment of mild thrush.  Patient stated understanding of all instructions; and was in agreement with this plan of care. The patient knows to call the clinic with any problems, questions or concerns.   This was shared visit with Dr. [name] today.   Total time spent with patient was 25 minutes;  with greater than 75 percent of that time spent in face to face counseling regarding patient's symptoms,  and coordination of care and follow up.  Disclaimer:This dictation was prepared with Dragon/digital dictation along with [employer]. Any transcriptional errors that result from this process are unintentional.  Drue Second, NP 08/22/2015   ADDENDUM: Hematology/Oncology Attending: I had a face to face encounter with the patient. I recommended his care plan. This is a very pleasant 74 years old white male with metastatic non-small cell lung cancer, adenocarcinoma status post several chemotherapy regimens and he is currently undergoing treatment with docetaxel and symptoms is status post  cycles. The patient came to the clinic today complaining of increasing fatigue and weakness as well as lack of appetite and dehydration. He also has oral thrush. We offered the patient IV hydration but he declined and he would like to increase his oral intake at home. He was started on treatment with Diflucan for the oral thrush. His absolute neutrophil count is low secondary to his recent treatment with docetaxel and Cyramza but the patient received Neulasta injection. We will continue to monitor his count closely. The patient would come back for follow-up visit in 2 weeks for reevaluation before starting cycle #3 of his treatment. He was advised to call immediately if he has any concerning symptoms in the interval.  Disclaimer: This note was dictated with  voice recognition software. Similar sounding words can inadvertently be transcribed and may be missed upon review.  Eilleen Kempf., MD 08/23/2015

## 2015-08-22 NOTE — Assessment & Plan Note (Signed)
Patient reports intermittent trace epistaxis.  Most likely, this is secondary to mild thrombocytopenia; and possibly due to dryness of nose when using his oxygen.  There was no obvious bleeding from the naris on exam.  Patient was advised to try using some Aquaphor to his nostrils to relieve dryness.

## 2015-08-22 NOTE — Assessment & Plan Note (Signed)
Patient is complaining of increased fatigue and generalized weakness within the past few weeks.  Both patient and his wife report that patient is literally living in his bedroom now; and only occasionally walking about the house.  When questioned-patient states that he becomes increasingly dyspneic with any ambulation whatsoever.  He feels slightly unsteady on his feet.  Patient states that it is too difficult for him to turn his oxygen concentrator on an use his oxygen via nasal cannula.  Advised patient that he should keep his oxygen concentrator on; and to place the nasal cannula in the bed with him.-So he can apply it prior to ambulating.  Long talk with both patient and his wife regarding the need to push protein and fluids at home.  Also, advised patient he needs to remain as active as possible; preferably walking about his home for 15-20 minutes every evening.

## 2015-08-22 NOTE — Assessment & Plan Note (Signed)
Patient admits to decreased oral intake within the past few days; and feeling dehydrated.  However, patient refused IV fluid rehydration today.  He stated he would prefer to return home and push fluids is much as possible.  Patient and his wife were both advised that patient can call and receive IV fluid rehydration at any time.

## 2015-08-22 NOTE — Assessment & Plan Note (Signed)
Patient received his last cycle of Taxotere/cyramza chemotherapy on 08/15/2015.  He received Neulasta for growth factor support following his chemo.  Patient was admitted to the hospital approx 2 -3 weeks ago for neutropenia; and increased pleural effusions.  He completed all of his antibiotics following discharge.   Patient continues to complain of progressive fatigue/weakness, and increased dyspnea as well.  He has been having to use home oxygen on a more regular basis; but dislikes using it.  He feels further dehydrated today as well.  He denies any recent fevers; but does complain of some intermittent significant chilling.  On exam today.  Patient does appear increasingly fatigued and weak.  He also appears to be very pale.   Vital signs essentially stable; and patient is afebrile.  Labs obtained today did reveal a WBC of 2.2, ANC 0.7, hemoglobin 11.7, platelet count 134.  Patient is scheduled to return on 09/05/2015 for labs, visit, his next cycle of chemotherapy.  He will also continue with weekly labs.

## 2015-08-22 NOTE — Assessment & Plan Note (Signed)
Patient has history of atrial fib; and continues to take Eliquis on a daily basis.

## 2015-08-23 ENCOUNTER — Other Ambulatory Visit: Payer: Self-pay | Admitting: Medical Oncology

## 2015-08-23 ENCOUNTER — Telehealth: Payer: Self-pay | Admitting: *Deleted

## 2015-08-23 DIAGNOSIS — C3490 Malignant neoplasm of unspecified part of unspecified bronchus or lung: Secondary | ICD-10-CM

## 2015-08-23 NOTE — Telephone Encounter (Signed)
Wife  Kittie called and left message re:  Pt had seen Retta Mac, NP yesterday for symptom management.  NP had ordered portable oxygen from [facility] for pt.  Pt was advised by [facility] that they need an order for pulse dose evaluation.  Kittie stated the order can be faxed to   [facility]     [phone]. Kittie's  Phone    [phone].

## 2015-08-24 ENCOUNTER — Telehealth: Payer: Self-pay

## 2015-08-24 NOTE — Telephone Encounter (Signed)
lvm we did fax order for pulse dose evaluation to [facility]. Also to see if how pt is doing after [facility] visit on 30th.

## 2015-08-29 ENCOUNTER — Other Ambulatory Visit (HOSPITAL_BASED_OUTPATIENT_CLINIC_OR_DEPARTMENT_OTHER): Payer: Medicare Other

## 2015-08-29 DIAGNOSIS — C3412 Malignant neoplasm of upper lobe, left bronchus or lung: Secondary | ICD-10-CM | POA: Diagnosis present

## 2015-08-29 DIAGNOSIS — C3492 Malignant neoplasm of unspecified part of left bronchus or lung: Secondary | ICD-10-CM

## 2015-08-29 LAB — COMPREHENSIVE METABOLIC PANEL (CC13)
ALT: 10 U/L (ref 0–55)
ANION GAP: 9 meq/L (ref 3–11)
AST: 18 U/L (ref 5–34)
Albumin: 2.5 g/dL — ABNORMAL LOW (ref 3.5–5.0)
Alkaline Phosphatase: 82 U/L (ref 40–150)
BILIRUBIN TOTAL: 0.59 mg/dL (ref 0.20–1.20)
BUN: 11.4 mg/dL (ref 7.0–26.0)
CHLORIDE: 108 meq/L (ref 98–109)
CO2: 27 meq/L (ref 22–29)
Calcium: 8.1 mg/dL — ABNORMAL LOW (ref 8.4–10.4)
Creatinine: 0.9 mg/dL (ref 0.7–1.3)
EGFR: 85 mL/min/{1.73_m2} — AB (ref >90)
GLUCOSE: 116 mg/dL (ref 70–140)
Potassium: 3.7 mEq/L (ref 3.5–5.1)
SODIUM: 144 meq/L (ref 136–145)
TOTAL PROTEIN: 5.1 g/dL — AB (ref 6.4–8.3)

## 2015-08-29 LAB — CBC WITH DIFFERENTIAL/PLATELET
BASO%: 0.8 % (ref 0.0–2.0)
Basophils Absolute: 0.1 10*3/uL (ref 0.0–0.1)
EOS ABS: 0 10*3/uL (ref 0.0–0.5)
EOS%: 0.2 % (ref 0.0–7.0)
HCT: 36.5 % — ABNORMAL LOW (ref 38.4–49.9)
HGB: 11.5 g/dL — ABNORMAL LOW (ref 13.0–17.1)
LYMPH%: 7.6 % — AB (ref 14.0–49.0)
MCH: 26.1 pg — ABNORMAL LOW (ref 27.2–33.4)
MCHC: 31.5 g/dL — AB (ref 32.0–36.0)
MCV: 82.9 fL (ref 79.3–98.0)
MONO#: 0.5 10*3/uL (ref 0.1–0.9)
MONO%: 4.3 % (ref 0.0–14.0)
NEUT%: 87.1 % — AB (ref 39.0–75.0)
NEUTROS ABS: 9.4 10*3/uL — AB (ref 1.5–6.5)
PLATELETS: 154 10*3/uL (ref 140–400)
RBC: 4.41 10*6/uL (ref 4.20–5.82)
RDW: 19.6 % — ABNORMAL HIGH (ref 11.0–14.6)
WBC: 10.7 10*3/uL — AB (ref 4.0–10.3)
lymph#: 0.8 10*3/uL — ABNORMAL LOW (ref 0.9–3.3)

## 2015-09-04 ENCOUNTER — Other Ambulatory Visit: Payer: Self-pay | Admitting: Family Medicine

## 2015-09-05 ENCOUNTER — Ambulatory Visit (HOSPITAL_BASED_OUTPATIENT_CLINIC_OR_DEPARTMENT_OTHER): Payer: Medicare Other | Admitting: Physician Assistant

## 2015-09-05 ENCOUNTER — Ambulatory Visit: Payer: Medicare Other

## 2015-09-05 ENCOUNTER — Telehealth: Payer: Self-pay | Admitting: Internal Medicine

## 2015-09-05 ENCOUNTER — Encounter: Payer: Self-pay | Admitting: Physician Assistant

## 2015-09-05 ENCOUNTER — Ambulatory Visit (HOSPITAL_BASED_OUTPATIENT_CLINIC_OR_DEPARTMENT_OTHER): Payer: Medicare Other

## 2015-09-05 ENCOUNTER — Other Ambulatory Visit (HOSPITAL_BASED_OUTPATIENT_CLINIC_OR_DEPARTMENT_OTHER): Payer: Medicare Other

## 2015-09-05 VITALS — BP 138/71 | HR 76 | Temp 97.0°F | Resp 19 | Ht 69.0 in | Wt 181.9 lb

## 2015-09-05 DIAGNOSIS — C787 Secondary malignant neoplasm of liver and intrahepatic bile duct: Secondary | ICD-10-CM | POA: Diagnosis not present

## 2015-09-05 DIAGNOSIS — C3492 Malignant neoplasm of unspecified part of left bronchus or lung: Secondary | ICD-10-CM | POA: Diagnosis not present

## 2015-09-05 DIAGNOSIS — C3412 Malignant neoplasm of upper lobe, left bronchus or lung: Secondary | ICD-10-CM

## 2015-09-05 DIAGNOSIS — Z79899 Other long term (current) drug therapy: Secondary | ICD-10-CM | POA: Diagnosis not present

## 2015-09-05 DIAGNOSIS — Z95828 Presence of other vascular implants and grafts: Secondary | ICD-10-CM

## 2015-09-05 DIAGNOSIS — Z5112 Encounter for antineoplastic immunotherapy: Secondary | ICD-10-CM

## 2015-09-05 DIAGNOSIS — R530 Neoplastic (malignant) related fatigue: Secondary | ICD-10-CM

## 2015-09-05 LAB — MAGNESIUM (CC13): MAGNESIUM: 1.6 mg/dL (ref 1.5–2.5)

## 2015-09-05 LAB — CBC WITH DIFFERENTIAL/PLATELET
BASO%: 0.2 % (ref 0.0–2.0)
Basophils Absolute: 0 10*3/uL (ref 0.0–0.1)
EOS%: 0 % (ref 0.0–7.0)
Eosinophils Absolute: 0 10*3/uL (ref 0.0–0.5)
HCT: 35.5 % — ABNORMAL LOW (ref 38.4–49.9)
HGB: 11.2 g/dL — ABNORMAL LOW (ref 13.0–17.1)
LYMPH%: 3.2 % — AB (ref 14.0–49.0)
MCH: 26.8 pg — ABNORMAL LOW (ref 27.2–33.4)
MCHC: 31.4 g/dL — AB (ref 32.0–36.0)
MCV: 85.4 fL (ref 79.3–98.0)
MONO#: 0.2 10*3/uL (ref 0.1–0.9)
MONO%: 1.6 % (ref 0.0–14.0)
NEUT%: 95 % — AB (ref 39.0–75.0)
NEUTROS ABS: 12 10*3/uL — AB (ref 1.5–6.5)
Platelets: 176 10*3/uL (ref 140–400)
RBC: 4.16 10*6/uL — AB (ref 4.20–5.82)
RDW: 22.6 % — ABNORMAL HIGH (ref 11.0–14.6)
WBC: 12.7 10*3/uL — AB (ref 4.0–10.3)
lymph#: 0.4 10*3/uL — ABNORMAL LOW (ref 0.9–3.3)

## 2015-09-05 LAB — TSH CHCC: TSH: 2.017 m[IU]/L (ref 0.320–4.118)

## 2015-09-05 LAB — COMPREHENSIVE METABOLIC PANEL (CC13)
ALT: 11 U/L (ref 0–55)
AST: 13 U/L (ref 5–34)
Albumin: 2.7 g/dL — ABNORMAL LOW (ref 3.5–5.0)
Alkaline Phosphatase: 79 U/L (ref 40–150)
Anion Gap: 10 mEq/L (ref 3–11)
BUN: 15.3 mg/dL (ref 7.0–26.0)
CHLORIDE: 107 meq/L (ref 98–109)
CO2: 24 meq/L (ref 22–29)
CREATININE: 0.9 mg/dL (ref 0.7–1.3)
Calcium: 8.2 mg/dL — ABNORMAL LOW (ref 8.4–10.4)
EGFR: 85 mL/min/{1.73_m2} — ABNORMAL LOW (ref >90)
Glucose: 326 mg/dl — ABNORMAL HIGH (ref 70–140)
POTASSIUM: 4.4 meq/L (ref 3.5–5.1)
Sodium: 141 mEq/L (ref 136–145)
Total Bilirubin: 0.47 mg/dL (ref 0.20–1.20)
Total Protein: 5.4 g/dL — ABNORMAL LOW (ref 6.4–8.3)

## 2015-09-05 MED ORDER — SODIUM CHLORIDE 0.9 % IJ SOLN
10.0000 mL | INTRAMUSCULAR | Status: DC | PRN
  Administered 2015-09-05: 10 mL via INTRAVENOUS
  Filled 2015-09-05: qty 10

## 2015-09-05 MED ORDER — SODIUM CHLORIDE 0.9 % IV SOLN
Freq: Once | INTRAVENOUS | Status: AC
  Administered 2015-09-05: 13:00:00 via INTRAVENOUS

## 2015-09-05 MED ORDER — DIPHENHYDRAMINE HCL 50 MG/ML IJ SOLN
50.0000 mg | Freq: Once | INTRAMUSCULAR | Status: AC
  Administered 2015-09-05: 50 mg via INTRAVENOUS

## 2015-09-05 MED ORDER — SODIUM CHLORIDE 0.9 % IJ SOLN
10.0000 mL | INTRAMUSCULAR | Status: DC | PRN
  Administered 2015-09-05: 10 mL
  Filled 2015-09-05: qty 10

## 2015-09-05 MED ORDER — SODIUM CHLORIDE 0.9 % IV SOLN
Freq: Once | INTRAVENOUS | Status: AC
  Administered 2015-09-05: 15:00:00 via INTRAVENOUS
  Filled 2015-09-05: qty 4

## 2015-09-05 MED ORDER — HEPARIN SOD (PORK) LOCK FLUSH 100 UNIT/ML IV SOLN
500.0000 [IU] | Freq: Once | INTRAVENOUS | Status: AC | PRN
  Administered 2015-09-05: 500 [IU]
  Filled 2015-09-05: qty 5

## 2015-09-05 MED ORDER — ACETAMINOPHEN 325 MG PO TABS
650.0000 mg | ORAL_TABLET | Freq: Once | ORAL | Status: AC
  Administered 2015-09-05: 650 mg via ORAL

## 2015-09-05 MED ORDER — ACETAMINOPHEN 325 MG PO TABS
ORAL_TABLET | ORAL | Status: AC
  Filled 2015-09-05: qty 2

## 2015-09-05 MED ORDER — DIPHENHYDRAMINE HCL 50 MG/ML IJ SOLN
INTRAMUSCULAR | Status: AC
  Filled 2015-09-05: qty 1

## 2015-09-05 MED ORDER — RAMUCIRUMAB CHEMO INJECTION 500 MG/50ML
9.5000 mg/kg | Freq: Once | INTRAVENOUS | Status: AC
  Administered 2015-09-05: 800 mg via INTRAVENOUS
  Filled 2015-09-05: qty 80

## 2015-09-05 MED ORDER — DOCETAXEL CHEMO INJECTION 160 MG/16ML: 60.0000 mg/m2 | Freq: Once | INTRAVENOUS | Status: DC

## 2015-09-05 MED ORDER — SODIUM CHLORIDE 0.9 % IV SOLN
60.0000 mg/m2 | Freq: Once | INTRAVENOUS | Status: AC
  Administered 2015-09-05: 120 mg via INTRAVENOUS
  Filled 2015-09-05: qty 12

## 2015-09-05 NOTE — Patient Instructions (Signed)
[facility] Discharge Instructions for Patients Receiving Chemotherapy  Today you received the following chemotherapy agents cyramza, taxotere  To help prevent nausea and vomiting after your treatment, we encourage you to take your nausea medication    If you develop nausea and vomiting that is not controlled by your nausea medication, call the clinic.   BELOW ARE SYMPTOMS THAT SHOULD BE REPORTED IMMEDIATELY:  *FEVER GREATER THAN 100.5 F  *CHILLS WITH OR WITHOUT FEVER  NAUSEA AND VOMITING THAT IS NOT CONTROLLED WITH YOUR NAUSEA MEDICATION  *UNUSUAL SHORTNESS OF BREATH  *UNUSUAL BRUISING OR BLEEDING  TENDERNESS IN MOUTH AND THROAT WITH OR WITHOUT PRESENCE OF ULCERS  *URINARY PROBLEMS  *BOWEL PROBLEMS  UNUSUAL RASH Items with * indicate a potential emergency and should be followed up as soon as possible.  Feel free to call the clinic you have any questions or concerns. The clinic phone number is [phone].  Please show the [facility] at check-in to the Emergency Department and triage nurse.

## 2015-09-05 NOTE — Progress Notes (Addendum)
[place] Telephone:([phone]   Fax:([phone]) [place], [place] [address]  DIAGNOSIS: Stage IIIB/IV non-small cell lung cancer, adenocarcinoma diagnosed in March of 2015.  PRIOR THERAPY:  1) Systemic chemotherapy with carboplatin for AUC of 5 and Alimta 500 mg/M2 every 3 weeks. First dose expected on 04/13/2014. Status post 6 cycles. 2)  Maintenance chemotherapy with single agent Alimta 500 mg/M2 every 3 weeks. First cycle on 09/06/2014. Status post 3 cycles. 3) Second course of systemic chemotherapy with carboplatin for AUC of 5 and Alimta 500 MG/M2 every 3 weeks. First cycle 11/08/2014. Status post 6 cycles. 4)  Immunotherapy with Nivolumab 3 MG/KG every 2 weeks. First dose 05/24/2015. Status post 3 cycles.  CURRENT THERAPY: Systemic chemotherapy with docetaxel 75 MG/M2 and Cyramza 10 MG/KG every 3 weeks status post 2 cycles. Docetaxel was reduced to 60 mg/m2 starting with cycle #2 secondary to neutropenia.  CODE STATUS: No CODE BLUE .  INTERVAL HISTORY: Shawn Espinoza 74 y.o. male returns to the clinic today for follow up visit accompanied by his wife. He is currently undergoing systemic chemotherapy with docetaxel and Cyramza status post 2 cycles. He is tolerating his treatment well except for severe neutropenia. He denied having any significant chest pain but continues to have shortness of breath with exertion and mild cough with no hemoptysis. He has no significant fever or chills, no nausea or vomiting. He is here today to start cycle #3 of his systemic chemotherapy.  MEDICAL HISTORY: Past Medical History  Diagnosis Date  . History of diabetes mellitus, type II     resolved with diet, h/o neuropathy  . Diverticulosis of colon   . Hyperlipidemia   . Hypertension   . Fatty liver 02/29/00    abd ultrasound  . Lower back pain   . Systolic murmur 3664    2Decho - normal LV fxn, EF 55%,  mild AS, biatrial enlargement  . History of tobacco use quit 1990s  . 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 Personal history of colonic adenomas 07/06/2013  . Hypertensive retinopathy of both eyes 2015    mild  . Dysrhythmia     Atrial fib (found 07/2014)  . Shortness of breath   . Pneumonia   . GERD (gastroesophageal reflux disease)     occasional  . Arthritis   . Neuropathy   . Constipation   . 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 Diabetes mellitus without complication     Type 2  . Depression   . Non-small cell carcinoma of left lung 02/2014    stage IIIb/IV on chemo    ALLERGIES:  is allergic to candesartan cilexetil; diltiazem hcl; doxazosin mesylate; nifedipine; pravastatin; red yeast rice; rosuvastatin; sertraline; simvastatin; and hydrocodone.  MEDICATIONS:  Current Outpatient Prescriptions  Medication Sig Dispense Refill  . albuterol (PROVENTIL HFA;VENTOLIN HFA) 108 (90 BASE) MCG/ACT inhaler Inhale 2 puffs into the lungs every 6 (six) hours as needed for wheezing or shortness of breath. 1 Inhaler 0  . Alum & Mag Hydroxide-Simeth (MAGIC MOUTHWASH W/LIDOCAINE) SOLN Take 5 mLs by mouth 3 (three) times daily as needed for mouth pain. 5 mL 0  . apixaban (ELIQUIS) 5 MG TABS tablet Take 1 tablet (5 mg total)  by mouth 2 (two) times daily. 60 tablet 6  . dexamethasone (DECADRON) 4 MG tablet 2 tab po bid, the day before, day of and day after chemotherapy every 3 weeks 40 tablet 1  . diphenoxylate-atropine (LOMOTIL) 2.5-0.025 MG per tablet Take 2 tablets by mouth 4 (four) times daily as needed for diarrhea or loose stools. 30 tablet 0  . fluconazole (DIFLUCAN) 100 MG tablet Take 1 tablet (100 mg total) by mouth daily. 21 tablet 0  . folic acid (FOLVITE) 1 MG tablet TAKE 1 TABLET BY MOUTH EVERY DAY 30 tablet 4  . glimepiride (AMARYL) 1 MG  tablet Take 1 mg by mouth daily with breakfast.    . guaiFENesin (MUCINEX) 600 MG 12 hr tablet Take 600 mg by mouth 2 (two) times daily as needed.    . hydrocortisone (ANUSOL-HC) 25 MG suppository Place 1 suppository (25 mg total) rectally 2 (two) times daily. 12 suppository 0  . ketorolac (ACULAR) 0.5 % ophthalmic solution PLACE 1 DROP IN LEFT EYE 4 TIMES A DAY  4  . KLOR-CON M10 10 MEQ tablet Take 20 mEq by mouth daily as needed (with torsemide).   10  . lidocaine-prilocaine (EMLA) cream Apply 1 application topically as needed. 30 g 1  . loratadine (CLARITIN) 10 MG tablet Take 10 mg by mouth as directed. 3 days before chemo and 4 days after    . metoprolol tartrate (LOPRESSOR) 25 MG tablet TAKE 1 TABLET TWICE A DAY 60 tablet 3  . Multiple Vitamins-Minerals (ICAPS) CAPS Take 1 capsule by mouth 2 (two) times daily.     . polyethylene glycol (MIRALAX / GLYCOLAX) packet Take 8.5 g by mouth daily as needed for moderate constipation.     . sucralfate (CARAFATE) 1 G tablet Take 1 tablet (1 g total) by mouth 4 (four) times daily -  with meals and at bedtime. 50 tablet 1  . torsemide (DEMADEX) 20 MG tablet Take 1 tablet (20 mg total) by mouth every other day.    . traMADol (ULTRAM) 50 MG tablet Take 1 tablet (50 mg total) by mouth every 6 (six) hours as needed. 45 tablet 0   No current facility-administered medications for this visit.   Facility-Administered Medications Ordered in Other Visits  Medication Dose Route Frequency Provider Last Rate Last Dose  . sodium chloride 0.9 % injection 10 mL  10 mL Intracatheter PRN Curt Bears, MD   10 mL at 09/05/15 1649    SURGICAL HISTORY:  Past Surgical History  Procedure Laterality Date  . Colonoscopy  2004  . Colonoscopy  2014    tubular adenoma x1, mod diverticulosis ([place])  . Flexible bronchoscopy  02/2014    WNL  . Video bronchoscopy Bilateral 03/17/2014    Procedure: VIDEO BRONCHOSCOPY WITH FLUORO;  Surgeon: Tanda Rockers, MD;  Location: [facility]  ENDOSCOPY;  Service: Cardiopulmonary;  Laterality: Bilateral;  . Chest tube insertion Left 08/26/2014    Procedure: INSERTION OF LEFT  PLEURAL DRAINAGE CATHETER;  Surgeon: Ivin Poot, MD;  Location: [facility];  Service: Thoracic;  Laterality: Left;  . Cataract extraction Right 03/2015    Dr [name]  . Portacath placement Right 03/31/2015    Procedure: INSERTION PORT-A-CATH;  Surgeon: Ivin Poot, MD;  Location: [facility];  Service: Thoracic;  Laterality: Right;  . Removal of pleural drainage catheter Left 03/31/2015    Procedure: REMOVAL OF PLEURAL DRAINAGE CATHETER;  Surgeon: Ivin Poot, MD;  Location: [facility];  Service: Thoracic;  Laterality: Left;  REVIEW OF SYSTEMS:  Constitutional: positive for fatigue Eyes: negative Ears, nose, mouth, throat, and face: negative Respiratory: positive for cough and dyspnea on exertion Cardiovascular: negative Gastrointestinal: negative Genitourinary:negative Integument/breast: negative Hematologic/lymphatic: negative Musculoskeletal:negative Neurological: negative Behavioral/Psych: negative Endocrine: negative Allergic/Immunologic: negative   PHYSICAL EXAMINATION: General appearance: alert, cooperative, fatigued and no distress Head: Normocephalic, without obvious abnormality, atraumatic Neck: no adenopathy, no JVD, supple, symmetrical, trachea midline and thyroid not enlarged, symmetric, no tenderness/mass/nodules Lymph nodes: Cervical, supraclavicular, and axillary nodes normal. Resp: diminished breath sounds LLL and dullness to percussion LLL Back: symmetric, no curvature. ROM normal. No CVA tenderness. Cardio: regular rate and rhythm, S1, S2 normal, no murmur, click, rub or gallop GI: soft, non-tender; bowel sounds normal; no masses,  no organomegaly Extremities: edema 2+ Neurologic: Alert and oriented X 3, normal strength and tone. Normal symmetric reflexes. Normal coordination and gait  ECOG PERFORMANCE STATUS: 1 - Symptomatic but  completely ambulatory  Blood pressure 138/71, pulse 76, temperature 97 F (36.1 C), temperature source Oral, resp. rate 19, height '5\' 9"'$  (1.753 m), weight 181 lb 14.4 oz (82.509 kg), SpO2 99 %.  LABORATORY DATA: Lab Results  Component Value Date   WBC 12.7* 09/05/2015   HGB 11.2* 09/05/2015   HCT 35.5* 09/05/2015   MCV 85.4 09/05/2015   PLT 176 09/05/2015      Chemistry      Component Value Date/Time   NA 141 09/05/2015 1123   NA 141 08/18/2015 1245   K 4.4 09/05/2015 1123   K 4.3 08/18/2015 1245   CL 106 08/18/2015 1245   CO2 24 09/05/2015 1123   CO2 28 08/18/2015 1245   BUN 15.3 09/05/2015 1123   BUN 26* 08/18/2015 1245   CREATININE 0.9 09/05/2015 1123   CREATININE 0.81 08/18/2015 1245   CREATININE 0.83 02/22/2014 1314      Component Value Date/Time   CALCIUM 8.2* 09/05/2015 1123   CALCIUM 8.3* 08/18/2015 1245   ALKPHOS 79 09/05/2015 1123   ALKPHOS 45 08/01/2015 0500   AST 13 09/05/2015 1123   AST 19 08/01/2015 0500   ALT 11 09/05/2015 1123   ALT 20 08/01/2015 0500   BILITOT 0.47 09/05/2015 1123   BILITOT 1.2 08/01/2015 0500       RADIOGRAPHIC STUDIES: No results found. ASSESSMENT AND PLAN: this is a very pleasant 74 years old white male with:  1) Stage IV non-small cell lung cancer, adenocarcinoma presenting with large right upper lobe lung mass in addition to mediastinal and bilateral hilar lymphadenopathy as well as cervical lymphadenopathy and left pleural effusion. The patient completed a course systemic chemotherapy with carboplatin and Alimta status post 6 cycles. This was followed by 3 cycles of maintenance chemotherapy with single agent Alimta. The patient tolerated his treatment well. He had evidence for disease progression and the patient was started on treatment again with carboplatin and Alimta status post 6 cycles. He was treated with 4 cycles of immunotherapy with Nivolumab discontinued secondary to disease progression. The patient is currently  on systemic chemotherapy with docetaxel and Cyramza status post 2 cycles and tolerated the treatment well except for severe neutropenia. He will continue with cycle #3 today as a scheduled with dose reduced docetaxel and Cyramza. The patient was discussed with and also seen by Dr. [name]. He will continue with weekly labs as scheduled. He will return in 3 weeks prior to cycle #4 with a restaging CT scan of the chest, abdomen and pelvis to re-evaluate his disease.The patient will continue on prophylactic  Neulasta.  2) Atrial fibrillation: continue Eliquis.  3) lower extremity edema: Improved. He will continue on Demadex but every other day.  The patient would come back for follow-up visit in 3 weeks for reevaluation and management of any adverse effect of his treatment before starting cycle #3. He was advised to call immediately if he has any concerning symptoms in the interval.  The patient voices understanding of current disease status and treatment options and is in agreement with the current care plan.  All questions were answered. The patient knows to call the clinic with any problems, questions or concerns. We can certainly see the patient much sooner if necessary.  Carlton Adam, PA-C 09/05/2015   ADDENDUM: Hematology/oncology Attending: I had a face to face encounter with the patient. I recommended his care plan. This is a very pleasant 74 years old white male with metastatic non-small cell lung cancer, adenocarcinoma status post several chemotherapy regimens and he is currently undergoing treatment with docetaxel and Cyramza status post 2 cycles and tolerating the treatment well except for neutropenia after his chemotherapy. He is currently on Neulasta. The patient is feeling better today. I recommended for him to proceed with cycle #3 as a scheduled. He would come back for follow-up visit in 3 weeks for evaluation after repeating CT scan of the chest, abdomen and pelvis for  restaging of his disease before starting cycle #4. The patient was advised to call immediately if he has any concerning symptoms in the interval.  Disclaimer: This note was dictated with voice recognition software. Similar sounding words can inadvertently be transcribed and may be missed upon review. Eilleen Kempf., MD 09/20/2015

## 2015-09-05 NOTE — Patient Instructions (Signed)

## 2015-09-05 NOTE — Telephone Encounter (Signed)
Pt confirmed labs/ov per 09/13 POF, gave pt AVS and Calendar.... KJ, chemo already added.Marland KitchenMarland KitchenMarland Kitchen

## 2015-09-07 ENCOUNTER — Ambulatory Visit (HOSPITAL_BASED_OUTPATIENT_CLINIC_OR_DEPARTMENT_OTHER): Payer: Medicare Other

## 2015-09-07 VITALS — BP 141/79 | HR 85 | Temp 97.6°F

## 2015-09-07 DIAGNOSIS — D709 Neutropenia, unspecified: Secondary | ICD-10-CM

## 2015-09-07 DIAGNOSIS — C3492 Malignant neoplasm of unspecified part of left bronchus or lung: Secondary | ICD-10-CM

## 2015-09-07 MED ORDER — PEGFILGRASTIM INJECTION 6 MG/0.6ML ~~LOC~~
6.0000 mg | PREFILLED_SYRINGE | Freq: Once | SUBCUTANEOUS | Status: AC
  Administered 2015-09-07: 6 mg via SUBCUTANEOUS
  Filled 2015-09-07: qty 0.6

## 2015-09-08 ENCOUNTER — Telehealth: Payer: Self-pay | Admitting: Family Medicine

## 2015-09-08 ENCOUNTER — Other Ambulatory Visit: Payer: Self-pay | Admitting: Physician Assistant

## 2015-09-08 DIAGNOSIS — C3492 Malignant neoplasm of unspecified part of left bronchus or lung: Secondary | ICD-10-CM

## 2015-09-08 MED ORDER — ALBUTEROL SULFATE HFA 108 (90 BASE) MCG/ACT IN AERS: 2.0000 | INHALATION_SPRAY | Freq: Four times a day (QID) | RESPIRATORY_TRACT | Status: AC | PRN

## 2015-09-08 MED ORDER — CLOTRIMAZOLE 1 % EX CREA: 1.0000 "application " | TOPICAL_CREAM | Freq: Two times a day (BID) | CUTANEOUS | Status: DC

## 2015-09-08 NOTE — Telephone Encounter (Signed)
plz notify med sent to pharmacy. Clotrimazole cream antifungal.

## 2015-09-08 NOTE — Telephone Encounter (Signed)
PLEASE NOTE: All timestamps contained within this report are represented as [place] Standard Time. CONFIDENTIALTY NOTICE: This fax transmission is intended only for the addressee. It contains information that is legally privileged, confidential or otherwise protected from use or disclosure. If you are not the intended recipient, you are strictly prohibited from reviewing, disclosing, copying using or disseminating any of this information or taking any action in reliance on or regarding this information. If you have received this fax in error, please notify us immediately by telephone so that we can arrange for its return to [place]. Phone: [phone], Toll-Free: [phone], Fax: [phone] Page: 1 of 1 Call Id: [id number] [place] Patient Name: Shawn Espinoza DOB: 02/01/1941 Initial Comment Caller states husband needs medication for a yeast infection Nurse Assessment Nurse: Marcelline Deist, RN, Lynda Date/Time (Eastern Time): 09/08/2015 11:02:47 AM Confirm and document reason for call. If symptomatic, describe symptoms. ---Caller states husband needs medication - cream for a yeast infection around genital area/penis. He was also given a Fluconazole tablet in Jan. But didn't take it as the cream worked well for similar symptoms. Has redness on penis & burning, usually after chemo rxs. Had one on Tuesday. Does not hurt when he urinates. Uses [facility] [phone]. NKDA, some rxs that he doesn't tolerate - statins. Has the patient traveled out of the country within the last 30 days? ---Not Applicable Does the patient require triage? ---Yes Related visit to physician within the last 2 weeks? ---No Does the PT have any chronic conditions? (i.e. diabetes, asthma, etc.) ---Yes List chronic conditions. ---stage 4 lung cancer, on BP rx, diabetes Guidelines Guideline Title Affirmed Question Affirmed  Notes Penis and Scrotum Symptoms Pain or burning with passing urine Final Disposition User See Physician within [facility], RN, Kermit Balo Comments Caller states her husband needs to rest today after his last chemo rx, so doesn't think he will be able to come in to be seen. Requesting to have refill on the cream he used in January for the yeast infection on genitals. Please contact caller at this # when refill is called in. Referrals REFERRED TO PCP OFFICE Disagree/Comply: Comply

## 2015-09-08 NOTE — Telephone Encounter (Signed)
Patients wife notified

## 2015-09-12 ENCOUNTER — Other Ambulatory Visit (HOSPITAL_BASED_OUTPATIENT_CLINIC_OR_DEPARTMENT_OTHER): Payer: Medicare Other

## 2015-09-12 ENCOUNTER — Telehealth: Payer: Self-pay | Admitting: Medical Oncology

## 2015-09-12 DIAGNOSIS — C3412 Malignant neoplasm of upper lobe, left bronchus or lung: Secondary | ICD-10-CM | POA: Diagnosis present

## 2015-09-12 DIAGNOSIS — C3492 Malignant neoplasm of unspecified part of left bronchus or lung: Secondary | ICD-10-CM

## 2015-09-12 LAB — COMPREHENSIVE METABOLIC PANEL (CC13)
ALBUMIN: 2.6 g/dL — AB (ref 3.5–5.0)
ALK PHOS: 66 U/L (ref 40–150)
ALT: 13 U/L (ref 0–55)
AST: 15 U/L (ref 5–34)
Anion Gap: 11 mEq/L (ref 3–11)
BUN: 17.4 mg/dL (ref 7.0–26.0)
CALCIUM: 8.6 mg/dL (ref 8.4–10.4)
CO2: 27 mEq/L (ref 22–29)
Chloride: 103 mEq/L (ref 98–109)
Creatinine: 0.9 mg/dL (ref 0.7–1.3)
EGFR: 82 mL/min/{1.73_m2} — AB (ref >90)
Glucose: 159 mg/dl — ABNORMAL HIGH (ref 70–140)
POTASSIUM: 4.1 meq/L (ref 3.5–5.1)
Sodium: 141 mEq/L (ref 136–145)
Total Bilirubin: 1.56 mg/dL — ABNORMAL HIGH (ref 0.20–1.20)
Total Protein: 5.3 g/dL — ABNORMAL LOW (ref 6.4–8.3)

## 2015-09-12 LAB — CBC WITH DIFFERENTIAL/PLATELET
BASO%: 1.7 % (ref 0.0–2.0)
BASOS ABS: 0 10*3/uL (ref 0.0–0.1)
EOS ABS: 0 10*3/uL (ref 0.0–0.5)
EOS%: 0 % (ref 0.0–7.0)
HEMATOCRIT: 33.3 % — AB (ref 38.4–49.9)
HEMOGLOBIN: 10.6 g/dL — AB (ref 13.0–17.1)
LYMPH#: 0.6 10*3/uL — AB (ref 0.9–3.3)
LYMPH%: 46.6 % (ref 14.0–49.0)
MCH: 27.7 pg (ref 27.2–33.4)
MCHC: 31.8 g/dL — ABNORMAL LOW (ref 32.0–36.0)
MCV: 87.2 fL (ref 79.3–98.0)
MONO#: 0.3 10*3/uL (ref 0.1–0.9)
MONO%: 28.8 % — ABNORMAL HIGH (ref 0.0–14.0)
NEUT#: 0.3 10*3/uL — CL (ref 1.5–6.5)
NEUT%: 22.9 % — AB (ref 39.0–75.0)
Platelets: 87 10*3/uL — ABNORMAL LOW (ref 140–400)
RBC: 3.82 10*6/uL — ABNORMAL LOW (ref 4.20–5.82)
RDW: 19.8 % — ABNORMAL HIGH (ref 11.0–14.6)
WBC: 1.2 10*3/uL — ABNORMAL LOW (ref 4.0–10.3)
nRBC: 0 % (ref 0–0)

## 2015-09-12 NOTE — Telephone Encounter (Signed)
I left message on pts voice mail.

## 2015-09-12 NOTE — Progress Notes (Signed)
Quick Note:  Call patient with the result and provide neutropenic precaution ______

## 2015-09-12 NOTE — Telephone Encounter (Signed)
-----   Message from Curt Bears, MD sent at 09/12/2015 12:42 PM EDT ----- Call patient with the result and provide neutropenic precaution

## 2015-09-15 ENCOUNTER — Telehealth: Payer: Self-pay | Admitting: Medical Oncology

## 2015-09-15 NOTE — Telephone Encounter (Signed)
Wife stated pt had a nose bleed that lasted 20 minutes this am . He applied pressure and it stopped. I called pt back and he was in bathroom so I spoke to wife . Per Cyndee I told her that if he has another  nosebleed to apply pressure again . If it recurs and last longer than 20 minutes to go to ED.

## 2015-09-19 ENCOUNTER — Other Ambulatory Visit (HOSPITAL_BASED_OUTPATIENT_CLINIC_OR_DEPARTMENT_OTHER): Payer: Medicare Other

## 2015-09-19 DIAGNOSIS — C3412 Malignant neoplasm of upper lobe, left bronchus or lung: Secondary | ICD-10-CM | POA: Diagnosis present

## 2015-09-19 DIAGNOSIS — C787 Secondary malignant neoplasm of liver and intrahepatic bile duct: Secondary | ICD-10-CM

## 2015-09-19 DIAGNOSIS — C3492 Malignant neoplasm of unspecified part of left bronchus or lung: Secondary | ICD-10-CM

## 2015-09-19 LAB — CBC WITH DIFFERENTIAL/PLATELET
BASO%: 0.7 % (ref 0.0–2.0)
BASOS ABS: 0.1 10*3/uL (ref 0.0–0.1)
EOS ABS: 0 10*3/uL (ref 0.0–0.5)
EOS%: 0.1 % (ref 0.0–7.0)
HCT: 33.3 % — ABNORMAL LOW (ref 38.4–49.9)
HGB: 10.4 g/dL — ABNORMAL LOW (ref 13.0–17.1)
LYMPH%: 9.1 % — AB (ref 14.0–49.0)
MCH: 27.5 pg (ref 27.2–33.4)
MCHC: 31.4 g/dL — ABNORMAL LOW (ref 32.0–36.0)
MCV: 87.6 fL (ref 79.3–98.0)
MONO#: 0.4 10*3/uL (ref 0.1–0.9)
MONO%: 3.9 % (ref 0.0–14.0)
NEUT#: 8.5 10*3/uL — ABNORMAL HIGH (ref 1.5–6.5)
NEUT%: 86.2 % — AB (ref 39.0–75.0)
PLATELETS: 119 10*3/uL — AB (ref 140–400)
RBC: 3.8 10*6/uL — AB (ref 4.20–5.82)
RDW: 22.2 % — ABNORMAL HIGH (ref 11.0–14.6)
WBC: 9.9 10*3/uL (ref 4.0–10.3)
lymph#: 0.9 10*3/uL (ref 0.9–3.3)

## 2015-09-19 LAB — COMPREHENSIVE METABOLIC PANEL (CC13)
ALBUMIN: 2.6 g/dL — AB (ref 3.5–5.0)
ALT: 9 U/L (ref 0–55)
AST: 13 U/L (ref 5–34)
Alkaline Phosphatase: 88 U/L (ref 40–150)
Anion Gap: 9 mEq/L (ref 3–11)
BUN: 10.2 mg/dL (ref 7.0–26.0)
CALCIUM: 8.2 mg/dL — AB (ref 8.4–10.4)
CO2: 25 mEq/L (ref 22–29)
CREATININE: 0.7 mg/dL (ref 0.7–1.3)
Chloride: 110 mEq/L — ABNORMAL HIGH (ref 98–109)
EGFR: >90 mL/min/{1.73_m2} (ref >90)
GLUCOSE: 150 mg/dL — AB (ref 70–140)
POTASSIUM: 3.6 meq/L (ref 3.5–5.1)
SODIUM: 144 meq/L (ref 136–145)
TOTAL PROTEIN: 5.2 g/dL — AB (ref 6.4–8.3)
Total Bilirubin: 0.56 mg/dL (ref 0.20–1.20)

## 2015-09-19 NOTE — Patient Instructions (Signed)
Continue weekly labs as scheduled Follow-up in 3 weeks prior to your next scheduled cycle of chemotherapy with a restaging CT scan of your chest, abdomen and pelvis to reevaluate your disease 

## 2015-09-20 ENCOUNTER — Telehealth: Payer: Self-pay | Admitting: Family Medicine

## 2015-09-20 NOTE — Telephone Encounter (Signed)
received request for pleurX cath supplies but I'm pretty sure this was removed earlier this year - plz verify with patient. Will place form in Kim's box tomorrow.

## 2015-09-20 NOTE — Telephone Encounter (Signed)
Spoke with patient's wife and confirmed that he no longer has cath.

## 2015-09-22 ENCOUNTER — Ambulatory Visit (HOSPITAL_BASED_OUTPATIENT_CLINIC_OR_DEPARTMENT_OTHER): Payer: Medicare Other

## 2015-09-22 ENCOUNTER — Encounter (HOSPITAL_COMMUNITY): Payer: Self-pay

## 2015-09-22 ENCOUNTER — Ambulatory Visit (HOSPITAL_COMMUNITY)
Admission: RE | Admit: 2015-09-22 | Discharge: 2015-09-22 | Disposition: A | Discharge status: Home / Self Care | Payer: Medicare Other | Source: Ambulatory Visit | Attending: Physician Assistant | Admitting: Physician Assistant

## 2015-09-22 DIAGNOSIS — C787 Secondary malignant neoplasm of liver and intrahepatic bile duct: Secondary | ICD-10-CM

## 2015-09-22 DIAGNOSIS — J90 Pleural effusion, not elsewhere classified: Secondary | ICD-10-CM | POA: Insufficient documentation

## 2015-09-22 DIAGNOSIS — R918 Other nonspecific abnormal finding of lung field: Secondary | ICD-10-CM | POA: Diagnosis not present

## 2015-09-22 DIAGNOSIS — K769 Liver disease, unspecified: Secondary | ICD-10-CM | POA: Insufficient documentation

## 2015-09-22 DIAGNOSIS — C3412 Malignant neoplasm of upper lobe, left bronchus or lung: Secondary | ICD-10-CM

## 2015-09-22 DIAGNOSIS — Z452 Encounter for adjustment and management of vascular access device: Secondary | ICD-10-CM | POA: Diagnosis present

## 2015-09-22 DIAGNOSIS — I700 Atherosclerosis of aorta: Secondary | ICD-10-CM | POA: Diagnosis not present

## 2015-09-22 DIAGNOSIS — C3490 Malignant neoplasm of unspecified part of unspecified bronchus or lung: Secondary | ICD-10-CM | POA: Diagnosis not present

## 2015-09-22 DIAGNOSIS — R599 Enlarged lymph nodes, unspecified: Secondary | ICD-10-CM | POA: Diagnosis not present

## 2015-09-22 DIAGNOSIS — C770 Secondary and unspecified malignant neoplasm of lymph nodes of head, face and neck: Secondary | ICD-10-CM | POA: Insufficient documentation

## 2015-09-22 DIAGNOSIS — C3492 Malignant neoplasm of unspecified part of left bronchus or lung: Secondary | ICD-10-CM

## 2015-09-22 DIAGNOSIS — Z95828 Presence of other vascular implants and grafts: Secondary | ICD-10-CM

## 2015-09-22 MED ORDER — HEPARIN SOD (PORK) LOCK FLUSH 100 UNIT/ML IV SOLN
500.0000 [IU] | Freq: Once | INTRAVENOUS | Status: AC
  Administered 2015-09-22: 500 [IU] via INTRAVENOUS
  Filled 2015-09-22: qty 5

## 2015-09-22 MED ORDER — IOHEXOL 300 MG/ML  SOLN
100.0000 mL | Freq: Once | INTRAMUSCULAR | Status: AC | PRN
  Administered 2015-09-22: 100 mL via INTRAVENOUS

## 2015-09-22 MED ORDER — SODIUM CHLORIDE 0.9 % IJ SOLN
10.0000 mL | INTRAMUSCULAR | Status: DC | PRN
  Administered 2015-09-22: 10 mL via INTRAVENOUS
  Filled 2015-09-22: qty 10

## 2015-09-22 NOTE — Patient Instructions (Signed)

## 2015-09-26 ENCOUNTER — Ambulatory Visit (HOSPITAL_BASED_OUTPATIENT_CLINIC_OR_DEPARTMENT_OTHER): Payer: Medicare Other

## 2015-09-26 ENCOUNTER — Ambulatory Visit: Payer: Medicare Other

## 2015-09-26 ENCOUNTER — Telehealth: Payer: Self-pay | Admitting: Internal Medicine

## 2015-09-26 ENCOUNTER — Encounter: Payer: Self-pay | Admitting: Internal Medicine

## 2015-09-26 ENCOUNTER — Other Ambulatory Visit (HOSPITAL_BASED_OUTPATIENT_CLINIC_OR_DEPARTMENT_OTHER): Payer: Medicare Other

## 2015-09-26 ENCOUNTER — Ambulatory Visit (HOSPITAL_BASED_OUTPATIENT_CLINIC_OR_DEPARTMENT_OTHER): Payer: Medicare Other | Admitting: Internal Medicine

## 2015-09-26 ENCOUNTER — Telehealth: Payer: Self-pay | Admitting: *Deleted

## 2015-09-26 VITALS — BP 141/84 | HR 90 | Temp 97.9°F | Resp 18 | Ht 69.0 in | Wt 187.6 lb

## 2015-09-26 DIAGNOSIS — R530 Neoplastic (malignant) related fatigue: Secondary | ICD-10-CM

## 2015-09-26 DIAGNOSIS — C3412 Malignant neoplasm of upper lobe, left bronchus or lung: Secondary | ICD-10-CM

## 2015-09-26 DIAGNOSIS — I4891 Unspecified atrial fibrillation: Secondary | ICD-10-CM | POA: Diagnosis not present

## 2015-09-26 DIAGNOSIS — R0609 Other forms of dyspnea: Secondary | ICD-10-CM

## 2015-09-26 DIAGNOSIS — D709 Neutropenia, unspecified: Secondary | ICD-10-CM | POA: Diagnosis not present

## 2015-09-26 DIAGNOSIS — C3492 Malignant neoplasm of unspecified part of left bronchus or lung: Secondary | ICD-10-CM

## 2015-09-26 DIAGNOSIS — Z5111 Encounter for antineoplastic chemotherapy: Secondary | ICD-10-CM | POA: Diagnosis not present

## 2015-09-26 DIAGNOSIS — Z79899 Other long term (current) drug therapy: Secondary | ICD-10-CM

## 2015-09-26 DIAGNOSIS — Z95828 Presence of other vascular implants and grafts: Secondary | ICD-10-CM

## 2015-09-26 DIAGNOSIS — Z5112 Encounter for antineoplastic immunotherapy: Secondary | ICD-10-CM | POA: Diagnosis present

## 2015-09-26 DIAGNOSIS — R5383 Other fatigue: Secondary | ICD-10-CM | POA: Diagnosis not present

## 2015-09-26 DIAGNOSIS — Z789 Other specified health status: Secondary | ICD-10-CM

## 2015-09-26 HISTORY — DX: Other specified health status: Z78.9

## 2015-09-26 LAB — MAGNESIUM (CC13): Magnesium: 1.8 mg/dl (ref 1.5–2.5)

## 2015-09-26 LAB — CBC WITH DIFFERENTIAL/PLATELET
BASO%: 0.6 % (ref 0.0–2.0)
BASOS ABS: 0.1 10*3/uL (ref 0.0–0.1)
EOS%: 0 % (ref 0.0–7.0)
Eosinophils Absolute: 0 10*3/uL (ref 0.0–0.5)
HCT: 32.3 % — ABNORMAL LOW (ref 38.4–49.9)
HGB: 10.3 g/dL — ABNORMAL LOW (ref 13.0–17.1)
LYMPH%: 3.6 % — AB (ref 14.0–49.0)
MCH: 28.6 pg (ref 27.2–33.4)
MCHC: 31.9 g/dL — AB (ref 32.0–36.0)
MCV: 89.6 fL (ref 79.3–98.0)
MONO#: 0.2 10*3/uL (ref 0.1–0.9)
MONO%: 2.1 % (ref 0.0–14.0)
NEUT#: 9.6 10*3/uL — ABNORMAL HIGH (ref 1.5–6.5)
NEUT%: 93.7 % — AB (ref 39.0–75.0)
PLATELETS: 149 10*3/uL (ref 140–400)
RBC: 3.6 10*6/uL — AB (ref 4.20–5.82)
RDW: 22.1 % — ABNORMAL HIGH (ref 11.0–14.6)
WBC: 10.3 10*3/uL (ref 4.0–10.3)
lymph#: 0.4 10*3/uL — ABNORMAL LOW (ref 0.9–3.3)

## 2015-09-26 LAB — COMPREHENSIVE METABOLIC PANEL (CC13)
ALT: 10 U/L (ref 0–55)
AST: 11 U/L (ref 5–34)
Albumin: 2.6 g/dL — ABNORMAL LOW (ref 3.5–5.0)
Alkaline Phosphatase: 84 U/L (ref 40–150)
Anion Gap: 7 mEq/L (ref 3–11)
BUN: 9.7 mg/dL (ref 7.0–26.0)
CALCIUM: 8.3 mg/dL — AB (ref 8.4–10.4)
CHLORIDE: 110 meq/L — AB (ref 98–109)
CO2: 25 meq/L (ref 22–29)
CREATININE: 0.7 mg/dL (ref 0.7–1.3)
EGFR: >90 mL/min/{1.73_m2} (ref >90)
Glucose: 264 mg/dl — ABNORMAL HIGH (ref 70–140)
Potassium: 4.1 mEq/L (ref 3.5–5.1)
Sodium: 142 mEq/L (ref 136–145)
Total Bilirubin: 0.36 mg/dL (ref 0.20–1.20)
Total Protein: 5.1 g/dL — ABNORMAL LOW (ref 6.4–8.3)

## 2015-09-26 LAB — TSH CHCC: TSH: 2.104 m[IU]/L (ref 0.320–4.118)

## 2015-09-26 MED ORDER — ACETAMINOPHEN 325 MG PO TABS
ORAL_TABLET | ORAL | Status: AC
  Filled 2015-09-26: qty 2

## 2015-09-26 MED ORDER — HEPARIN SOD (PORK) LOCK FLUSH 100 UNIT/ML IV SOLN
250.0000 [IU] | Freq: Once | INTRAVENOUS | Status: DC | PRN
  Filled 2015-09-26: qty 5

## 2015-09-26 MED ORDER — DIPHENHYDRAMINE HCL 50 MG/ML IJ SOLN
50.0000 mg | Freq: Once | INTRAMUSCULAR | Status: AC
  Administered 2015-09-26: 50 mg via INTRAVENOUS

## 2015-09-26 MED ORDER — DIPHENHYDRAMINE HCL 50 MG/ML IJ SOLN
INTRAMUSCULAR | Status: AC
  Filled 2015-09-26: qty 1

## 2015-09-26 MED ORDER — SODIUM CHLORIDE 0.9 % IV SOLN
Freq: Once | INTRAVENOUS | Status: AC
  Administered 2015-09-26: 12:00:00 via INTRAVENOUS

## 2015-09-26 MED ORDER — ACETAMINOPHEN 325 MG PO TABS
650.0000 mg | ORAL_TABLET | Freq: Once | ORAL | Status: AC
  Administered 2015-09-26: 650 mg via ORAL

## 2015-09-26 MED ORDER — SODIUM CHLORIDE 0.9 % IJ SOLN
10.0000 mL | INTRAMUSCULAR | Status: DC | PRN
  Administered 2015-09-26: 10 mL
  Filled 2015-09-26: qty 10

## 2015-09-26 MED ORDER — SODIUM CHLORIDE 0.9 % IV SOLN
10.0000 mg/kg | Freq: Once | INTRAVENOUS | Status: AC
  Administered 2015-09-26: 800 mg via INTRAVENOUS
  Filled 2015-09-26: qty 70

## 2015-09-26 MED ORDER — SODIUM CHLORIDE 0.9 % IV SOLN
60.0000 mg/m2 | Freq: Once | INTRAVENOUS | Status: AC
  Administered 2015-09-26: 120 mg via INTRAVENOUS
  Filled 2015-09-26: qty 12

## 2015-09-26 MED ORDER — SODIUM CHLORIDE 0.9 % IJ SOLN
3.0000 mL | INTRAMUSCULAR | Status: DC | PRN
  Filled 2015-09-26: qty 10

## 2015-09-26 MED ORDER — ALTEPLASE 2 MG IJ SOLR
2.0000 mg | Freq: Once | INTRAMUSCULAR | Status: DC | PRN
  Filled 2015-09-26: qty 2

## 2015-09-26 MED ORDER — SODIUM CHLORIDE 0.9 % IV SOLN
Freq: Once | INTRAVENOUS | Status: AC
  Administered 2015-09-26: 14:00:00 via INTRAVENOUS
  Filled 2015-09-26: qty 4

## 2015-09-26 MED ORDER — SODIUM CHLORIDE 0.9 % IJ SOLN
10.0000 mL | INTRAMUSCULAR | Status: DC | PRN
  Administered 2015-09-26: 10 mL via INTRAVENOUS
  Filled 2015-09-26: qty 10

## 2015-09-26 MED ORDER — HEPARIN SOD (PORK) LOCK FLUSH 100 UNIT/ML IV SOLN
500.0000 [IU] | Freq: Once | INTRAVENOUS | Status: AC | PRN
  Administered 2015-09-26: 500 [IU]
  Filled 2015-09-26: qty 5

## 2015-09-26 NOTE — Telephone Encounter (Signed)
Per staff message and POF I have scheduled appts. Advised scheduler of appts. JMW  

## 2015-09-26 NOTE — Progress Notes (Signed)
[facility] Telephone:([phone]) [phone]   Fax:(336) [place], [place] [address]  DIAGNOSIS: Stage IIIB/IV non-small cell lung cancer, adenocarcinoma diagnosed in March of 2015.  PRIOR THERAPY:  1) Systemic chemotherapy with carboplatin for AUC of 5 and Alimta 500 mg/M2 every 3 weeks. First dose expected on 04/13/2014. Status post 6 cycles. 2)  Maintenance chemotherapy with single agent Alimta 500 mg/M2 every 3 weeks. First cycle on 09/06/2014. Status post 3 cycles. 3) Second course of systemic chemotherapy with carboplatin for AUC of 5 and Alimta 500 MG/M2 every 3 weeks. First cycle 11/08/2014. Status post 6 cycles. 4)  Immunotherapy with Nivolumab 3 MG/KG every 2 weeks. First dose 05/24/2015. Status post 3 cycles.  CURRENT THERAPY: Systemic chemotherapy with docetaxel 60 MG/M2 and Cyramza 10 MG/KG every 3 weeks status post 3 cycles.  INTERVAL HISTORY: Shawn Espinoza 74 y.o. male returns to the clinic today for follow up visit accompanied by his wife. He is currently undergoing systemic chemotherapy with docetaxel and Cyramza status post 3 cycles. He is rating his treatment well except for the fatigue for around 7 days after the Neulasta injection. His last week was very good and the patient was very active. He denied having any significant chest pain but continues to have shortness of breath with exertion and mild cough with no hemoptysis. He has no significant fever or chills, no nausea or vomiting. The patient had repeat CT scan of the chest, abdomen and pelvis performed recently and he is here for evaluation and discussion of his scan results.  MEDICAL HISTORY: Past Medical History  Diagnosis Date  . History of diabetes mellitus, type II     resolved with diet, h/o neuropathy  . Diverticulosis of colon   . Hyperlipidemia   . Hypertension   . Fatty liver 02/29/00    abd ultrasound  . Lower back  pain   . Systolic murmur 7948    2Decho - normal LV fxn, EF 55%, mild AS, biatrial enlargement  . History of tobacco use quit 1990s  . 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 Personal history of colonic adenomas 07/06/2013  . Hypertensive retinopathy of both eyes 2015    mild  . Dysrhythmia     Atrial fib (found 07/2014)  . Shortness of breath   . Pneumonia   . GERD (gastroesophageal reflux disease)     occasional  . Arthritis   . Neuropathy ([place])   . Constipation   . 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 Diabetes mellitus without complication (HCC)     Type 2  . Depression   . Non-small cell carcinoma of left lung ([place]) 02/2014    stage IIIb/IV on chemo    ALLERGIES:  is allergic to candesartan cilexetil; diltiazem hcl; doxazosin mesylate; nifedipine; pravastatin; red yeast rice; rosuvastatin; sertraline; simvastatin; and hydrocodone.  MEDICATIONS:  Current Outpatient Prescriptions  Medication Sig Dispense Refill  . albuterol (PROVENTIL HFA;VENTOLIN HFA) 108 (90 BASE) MCG/ACT inhaler Inhale 2 puffs into the lungs every 6 (six) hours as needed for wheezing or shortness of breath. 1 Inhaler 3  . apixaban (ELIQUIS) 5 MG TABS tablet Take 1 tablet (5 mg total) by mouth 2 (two) times daily. 60 tablet 6  .  clotrimazole (LOTRIMIN) 1 % cream Apply 1 application topically 2 (two) times daily. 60 g 0  . dexamethasone (DECADRON) 4 MG tablet 2 tab po bid, the day before, day of and day after chemotherapy every 3 weeks 40 tablet 1  . glimepiride (AMARYL) 1 MG tablet Take 1 mg by mouth daily with breakfast.    . guaiFENesin (MUCINEX) 600 MG 12 hr tablet Take 600 mg by mouth 2 (two) times daily as needed.    . hydrocortisone (ANUSOL-HC) 25 MG suppository Place 1 suppository (25 mg total) rectally 2 (two) times daily. 12 suppository 0  . ketorolac  (ACULAR) 0.5 % ophthalmic solution PLACE 1 DROP IN LEFT EYE 4 TIMES A DAY  4  . lidocaine-prilocaine (EMLA) cream Apply 1 application topically as needed. 30 g 1  . loratadine (CLARITIN) 10 MG tablet Take 10 mg by mouth as directed. 3 days before chemo and 4 days after    . metoprolol tartrate (LOPRESSOR) 25 MG tablet TAKE 1 TABLET TWICE A DAY 60 tablet 3  . Multiple Vitamins-Minerals (ICAPS) CAPS Take 1 capsule by mouth 2 (two) times daily.     . polyethylene glycol (MIRALAX / GLYCOLAX) packet Take 8.5 g by mouth daily as needed for moderate constipation.     . Alum & Mag Hydroxide-Simeth (MAGIC MOUTHWASH W/LIDOCAINE) SOLN Take 5 mLs by mouth 3 (three) times daily as needed for mouth pain. 5 mL 0  . diphenoxylate-atropine (LOMOTIL) 2.5-0.025 MG per tablet Take 2 tablets by mouth 4 (four) times daily as needed for diarrhea or loose stools. 30 tablet 0  . fluconazole (DIFLUCAN) 100 MG tablet Take 1 tablet (100 mg total) by mouth daily. 21 tablet 0  . folic acid (FOLVITE) 1 MG tablet TAKE 1 TABLET BY MOUTH EVERY DAY 30 tablet 4  . KLOR-CON M10 10 MEQ tablet Take 20 mEq by mouth daily as needed (with torsemide).   10  . sucralfate (CARAFATE) 1 G tablet Take 1 tablet (1 g total) by mouth 4 (four) times daily -  with meals and at bedtime. 50 tablet 1  . torsemide (DEMADEX) 20 MG tablet Take 1 tablet (20 mg total) by mouth every other day.    . traMADol (ULTRAM) 50 MG tablet Take 1 tablet (50 mg total) by mouth every 6 (six) hours as needed. 45 tablet 0   No current facility-administered medications for this visit.    SURGICAL HISTORY:  Past Surgical History  Procedure Laterality Date  . Colonoscopy  2004  . Colonoscopy  2014    tubular adenoma x1, mod diverticulosis ([place])  . Flexible bronchoscopy  02/2014    WNL  . Video bronchoscopy Bilateral 03/17/2014    Procedure: VIDEO BRONCHOSCOPY WITH FLUORO;  Surgeon: Tanda Rockers, MD;  Location: [facility];  Service: Cardiopulmonary;  Laterality:  Bilateral;  . Chest tube insertion Left 08/26/2014    Procedure: INSERTION OF LEFT  PLEURAL DRAINAGE CATHETER;  Surgeon: Ivin Poot, MD;  Location: [facility];  Service: Thoracic;  Laterality: Left;  . Cataract extraction Right 03/2015    Dr [name]  . Portacath placement Right 03/31/2015    Procedure: INSERTION PORT-A-CATH;  Surgeon: Ivin Poot, MD;  Location: [facility];  Service: Thoracic;  Laterality: Right;  . Removal of pleural drainage catheter Left 03/31/2015    Procedure: REMOVAL OF PLEURAL DRAINAGE CATHETER;  Surgeon: Ivin Poot, MD;  Location: [facility];  Service: Thoracic;  Laterality: Left;    REVIEW OF SYSTEMS:  Constitutional: positive  for fatigue Eyes: negative Ears, nose, mouth, throat, and face: negative Respiratory: positive for dyspnea on exertion Cardiovascular: negative Gastrointestinal: negative Genitourinary:negative Integument/breast: negative Hematologic/lymphatic: negative Musculoskeletal:negative Neurological: negative Behavioral/Psych: negative Endocrine: negative Allergic/Immunologic: negative   PHYSICAL EXAMINATION: General appearance: alert, cooperative, fatigued and no distress Head: Normocephalic, without obvious abnormality, atraumatic Neck: no adenopathy, no JVD, supple, symmetrical, trachea midline and thyroid not enlarged, symmetric, no tenderness/mass/nodules Lymph nodes: Cervical, supraclavicular, and axillary nodes normal. Resp: diminished breath sounds LLL and dullness to percussion LLL Back: symmetric, no curvature. ROM normal. No CVA tenderness. Cardio: regular rate and rhythm, S1, S2 normal, no murmur, click, rub or gallop GI: soft, non-tender; bowel sounds normal; no masses,  no organomegaly Extremities: edema 2+ Neurologic: Alert and oriented X 3, normal strength and tone. Normal symmetric reflexes. Normal coordination and gait  ECOG PERFORMANCE STATUS: 1 - Symptomatic but completely ambulatory  Blood pressure 141/84, pulse 90,  temperature 97.9 F (36.6 C), temperature source Oral, resp. rate 18, height '5\' 9"'$  (1.753 m), weight 187 lb 9.6 oz (85.095 kg), SpO2 100 %.  LABORATORY DATA: Lab Results  Component Value Date   WBC 10.3 09/26/2015   HGB 10.3* 09/26/2015   HCT 32.3* 09/26/2015   MCV 89.6 09/26/2015   PLT 149 09/26/2015      Chemistry      Component Value Date/Time   NA 142 09/26/2015 1048   NA 141 08/18/2015 1245   K 4.1 09/26/2015 1048   K 4.3 08/18/2015 1245   CL 106 08/18/2015 1245   CO2 25 09/26/2015 1048   CO2 28 08/18/2015 1245   BUN 9.7 09/26/2015 1048   BUN 26* 08/18/2015 1245   CREATININE 0.7 09/26/2015 1048   CREATININE 0.81 08/18/2015 1245   CREATININE 0.83 02/22/2014 1314      Component Value Date/Time   CALCIUM 8.3* 09/26/2015 1048   CALCIUM 8.3* 08/18/2015 1245   ALKPHOS 84 09/26/2015 1048   ALKPHOS 45 08/01/2015 0500   AST 11 09/26/2015 1048   AST 19 08/01/2015 0500   ALT 10 09/26/2015 1048   ALT 20 08/01/2015 0500   BILITOT 0.36 09/26/2015 1048   BILITOT 1.2 08/01/2015 0500       RADIOGRAPHIC STUDIES: Ct Chest W Contrast  09/22/2015   CLINICAL DATA:  Subsequent encounter for lung cancer with metastatic disease to the hilum and cervical lymph nodes.  EXAM: CT CHEST, ABDOMEN, AND PELVIS WITH CONTRAST  TECHNIQUE: Multidetector CT imaging of the chest, abdomen and pelvis was performed following the standard protocol during bolus administration of intravenous contrast.  CONTRAST:  160m OMNIPAQUE IOHEXOL 300 MG/ML  SOLN  COMPARISON:  07/07/2015.  FINDINGS: CT CHEST FINDINGS  Mediastinum/Lymph Nodes: Right Port-A-Cath tip is at the junction of the SVC and RA. There is no axillary lymphadenopathy. No mediastinal lymphadenopathy. There is no hilar lymphadenopathy.  Mild circumferential wall thickening is noted in the distal esophagus, slightly progressed in the interval. Heart size is normal. Trace pericardial effusion/thickening is unchanged.  Lungs/Pleura: Hyperexpansion is  consistent with emphysema. 12 mm nodule in the subpleural aspect of the lateral left upper lobe seen previously has resolved in the interval. The 10 mm peripheral nodule in the left lower lobe noted on the prior study is now 8 mm. The 8 mm right middle lobe pulmonary nodule seen previously has resolved in the interval. 9 mm left lower lobe nodule seen previously has resolved. Tiny bilateral pleural effusions are evident with some trace fluid tracking in the left major fissure. No pulmonary  edema or focal airspace consolidation.  Musculoskeletal: Bone windows reveal no worrisome lytic or sclerotic osseous lesions.  CT ABDOMEN PELVIS FINDINGS  Hepatobiliary: Dominant right hepatic lesion measures 5.7 x 6.8 cm today compared to 5.5 x 6.9 cm previously. No new liver lesion is evident. Layering high attenuation material in the gallbladder lumen likely related to layering sludge. No intrahepatic or extrahepatic biliary dilation.  Pancreas: No focal mass lesion. No dilatation of the main duct. No intraparenchymal cyst. No peripancreatic edema.  Spleen: No splenomegaly. No focal mass lesion.  Adrenals/Urinary Tract: No adrenal nodule or mass. No enhancing lesion in either kidney. No hydroureteronephrosis The urinary bladder appears normal for the degree of distention.  Stomach/Bowel: Stomach is nondistended. No gastric wall thickening. No evidence of outlet obstruction. Duodenum is normally positioned as is the ligament of Treitz. No small bowel wall thickening. No small bowel dilatation. The terminal ileum is normal. The appendix is normal. Diverticular changes are noted in the left colon without evidence of diverticulitis.  Vascular/Lymphatic: There is abdominal aortic atherosclerosis without aneurysm. There is no gastrohepatic or hepatoduodenal ligament lymphadenopathy. No intraperitoneal or retroperitoneal lymphadenopy. 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  Reproductive:  Prostate gland is mildly enlarged. Seminal vesicles unremarkable.  Other: Small volume intraperitoneal free fluid noted.  Musculoskeletal: Bone windows reveal no worrisome lytic or sclerotic osseous lesions.  IMPRESSION: 1. Interval decrease/resolution of pulmonary nodules. 2. Stable appearance of the heterogeneously enhancing liver lesion. No additional abnormality within the liver parenchyma on today's exam. 3. Persistent circumferential wall thickening in the distal esophagus.   Electronically Signed   By: Misty Stanley M.D.   On: 09/22/2015 12:44   Ct Abdomen Pelvis W Contrast  09/22/2015   CLINICAL DATA:  Subsequent encounter for lung cancer with metastatic disease to the hilum and cervical lymph nodes.  EXAM: CT CHEST, ABDOMEN, AND PELVIS WITH CONTRAST  TECHNIQUE: Multidetector CT imaging of the chest, abdomen and pelvis was performed following the standard protocol during bolus administration of intravenous contrast.  CONTRAST:  141m OMNIPAQUE IOHEXOL 300 MG/ML  SOLN  COMPARISON:  07/07/2015.  FINDINGS: CT CHEST FINDINGS  Mediastinum/Lymph Nodes: Right Port-A-Cath tip is at the junction of the SVC and RA. There is no axillary lymphadenopathy. No mediastinal lymphadenopathy. There is no hilar lymphadenopathy.  Mild circumferential wall thickening is noted in the distal esophagus, slightly progressed in the interval. Heart size is normal. Trace pericardial effusion/thickening is unchanged.  Lungs/Pleura: Hyperexpansion is consistent with emphysema. 12 mm nodule in the subpleural aspect of the lateral left upper lobe seen previously has resolved in the interval. The 10 mm peripheral nodule in the left lower lobe noted on the prior study is now 8 mm. The 8 mm right middle lobe pulmonary nodule seen previously has resolved in the interval. 9 mm left lower lobe nodule seen previously has resolved. Tiny bilateral pleural effusions are evident with some trace fluid tracking in the left major fissure. No pulmonary  edema or focal airspace consolidation.  Musculoskeletal: Bone windows reveal no worrisome lytic or sclerotic osseous lesions.  CT ABDOMEN PELVIS FINDINGS  Hepatobiliary: Dominant right hepatic lesion measures 5.7 x 6.8 cm today compared to 5.5 x 6.9 cm previously. No new liver lesion is evident. Layering high attenuation material in the gallbladder lumen likely related to layering sludge. No intrahepatic or extrahepatic biliary dilation.  Pancreas: No focal mass lesion. No dilatation of the main duct. No intraparenchymal cyst. No peripancreatic edema.  Spleen: No splenomegaly.  No focal mass lesion.  Adrenals/Urinary Tract: No adrenal nodule or mass. No enhancing lesion in either kidney. No hydroureteronephrosis The urinary bladder appears normal for the degree of distention.  Stomach/Bowel: Stomach is nondistended. No gastric wall thickening. No evidence of outlet obstruction. Duodenum is normally positioned as is the ligament of Treitz. No small bowel wall thickening. No small bowel dilatation. The terminal ileum is normal. The appendix is normal. Diverticular changes are noted in the left colon without evidence of diverticulitis.  Vascular/Lymphatic: There is abdominal aortic atherosclerosis without aneurysm. There is no gastrohepatic or hepatoduodenal ligament lymphadenopathy. No intraperitoneal or retroperitoneal lymphadenopy. 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  Reproductive: Prostate gland is mildly enlarged. Seminal vesicles unremarkable.  Other: Small volume intraperitoneal free fluid noted.  Musculoskeletal: Bone windows reveal no worrisome lytic or sclerotic osseous lesions.  IMPRESSION: 1. Interval decrease/resolution of pulmonary nodules. 2. Stable appearance of the heterogeneously enhancing liver lesion. No additional abnormality within the liver parenchyma on today's exam. 3. Persistent circumferential wall thickening in the distal esophagus.   Electronically  Signed   By: Misty Stanley M.D.   On: 09/22/2015 12:44   ASSESSMENT AND PLAN: this is a very pleasant 74 years old white male with:  1) Stage IV non-small cell lung cancer, adenocarcinoma presenting with large right upper lobe lung mass in addition to mediastinal and bilateral hilar lymphadenopathy as well as cervical lymphadenopathy and left pleural effusion. The patient completed a course systemic chemotherapy with carboplatin and Alimta status post 6 cycles. This was followed by 3 cycles of maintenance chemotherapy with single agent Alimta. The patient tolerated his treatment well. He had evidence for disease progression and the patient was started on treatment again with carboplatin and Alimta status post 6 cycles. He was treated with 4 cycles of immunotherapy with Nivolumab discontinued secondary to disease progression. The patient is currently on systemic chemotherapy with docetaxel and Cyramza status post 3 cycles and tolerated the treatment well except for neutropenia and fatigue. His neutropenia had improved after I reduce the dose of docetaxel to 60 MG/M2 with continuous Neulasta support.  The recent CT scan of the chest, abdomen and pelvis showed interval decrease/resolution of pulmonary nodules with no other evidence of disease progression. I discussed the scan results with the patient and his wife. I recommended for him to continue his current treatment with docetaxel and Cyramza. He will receive cycle #4 today.  2) Atrial fibrillation: continue Eliquis.  3) lower extremity edema: Improved.   4) CODE STATUS: Discussed again with the patient and his wife. He would like initial resuscitation but no prolonged support Carafate) vegetative state.   The patient would come back for follow-up visit in 3 weeks for reevaluation and management of any adverse effect of his treatment before starting cycle #5. He was advised to call immediately if he has any concerning symptoms in the  interval.  The patient voices understanding of current disease status and treatment options and is in agreement with the current care plan.  All questions were answered. The patient knows to call the clinic with any problems, questions or concerns. We can certainly see the patient much sooner if necessary.  Disclaimer: This note was dictated with voice recognition software. Similar sounding words can inadvertently be transcribed and may not be corrected upon review.

## 2015-09-26 NOTE — Telephone Encounter (Signed)
per pofto sch pt appt-sent MW email to sch pt trmt-pt to get updated copy of sch 10/6

## 2015-09-26 NOTE — Patient Instructions (Signed)

## 2015-09-28 ENCOUNTER — Ambulatory Visit (HOSPITAL_BASED_OUTPATIENT_CLINIC_OR_DEPARTMENT_OTHER): Payer: Medicare Other

## 2015-09-28 VITALS — BP 162/85 | HR 84 | Temp 97.7°F | Resp 16

## 2015-09-28 DIAGNOSIS — D709 Neutropenia, unspecified: Secondary | ICD-10-CM

## 2015-09-28 DIAGNOSIS — C3492 Malignant neoplasm of unspecified part of left bronchus or lung: Secondary | ICD-10-CM

## 2015-09-28 MED ORDER — PEGFILGRASTIM INJECTION 6 MG/0.6ML ~~LOC~~
6.0000 mg | PREFILLED_SYRINGE | Freq: Once | SUBCUTANEOUS | Status: AC
  Administered 2015-09-28: 6 mg via SUBCUTANEOUS
  Filled 2015-09-28: qty 0.6

## 2015-10-03 ENCOUNTER — Other Ambulatory Visit (HOSPITAL_BASED_OUTPATIENT_CLINIC_OR_DEPARTMENT_OTHER): Payer: Medicare Other

## 2015-10-03 DIAGNOSIS — C3492 Malignant neoplasm of unspecified part of left bronchus or lung: Secondary | ICD-10-CM | POA: Diagnosis present

## 2015-10-03 LAB — CBC WITH DIFFERENTIAL/PLATELET
BASO%: 1.3 % (ref 0.0–2.0)
Basophils Absolute: 0 10*3/uL (ref 0.0–0.1)
EOS ABS: 0 10*3/uL (ref 0.0–0.5)
EOS%: 0.1 % (ref 0.0–7.0)
HCT: 29.1 % — ABNORMAL LOW (ref 38.4–49.9)
HGB: 9.3 g/dL — ABNORMAL LOW (ref 13.0–17.1)
LYMPH%: 39.1 % (ref 14.0–49.0)
MCH: 28.8 pg (ref 27.2–33.4)
MCHC: 32 g/dL (ref 32.0–36.0)
MCV: 90.1 fL (ref 79.3–98.0)
MONO#: 0.2 10*3/uL (ref 0.1–0.9)
MONO%: 16.4 % — AB (ref 0.0–14.0)
NEUT%: 43.1 % (ref 39.0–75.0)
NEUTROS ABS: 0.5 10*3/uL — AB (ref 1.5–6.5)
Platelets: 59 10*3/uL — ABNORMAL LOW (ref 140–400)
RBC: 3.23 10*6/uL — AB (ref 4.20–5.82)
RDW: 19.5 % — ABNORMAL HIGH (ref 11.0–14.6)
WBC: 1.2 10*3/uL — AB (ref 4.0–10.3)
lymph#: 0.5 10*3/uL — ABNORMAL LOW (ref 0.9–3.3)

## 2015-10-03 LAB — COMPREHENSIVE METABOLIC PANEL (CC13)
ALT: 13 U/L (ref 0–55)
AST: 13 U/L (ref 5–34)
Albumin: 2.5 g/dL — ABNORMAL LOW (ref 3.5–5.0)
Alkaline Phosphatase: 73 U/L (ref 40–150)
Anion Gap: 9 mEq/L (ref 3–11)
BUN: 13.8 mg/dL (ref 7.0–26.0)
CALCIUM: 8.3 mg/dL — AB (ref 8.4–10.4)
CHLORIDE: 104 meq/L (ref 98–109)
CO2: 27 mEq/L (ref 22–29)
Creatinine: 0.7 mg/dL (ref 0.7–1.3)
EGFR: >90 mL/min/{1.73_m2} (ref >90)
GLUCOSE: 178 mg/dL — AB (ref 70–140)
Potassium: 3.8 mEq/L (ref 3.5–5.1)
SODIUM: 140 meq/L (ref 136–145)
Total Bilirubin: 1.02 mg/dL (ref 0.20–1.20)
Total Protein: 5 g/dL — ABNORMAL LOW (ref 6.4–8.3)

## 2015-10-04 ENCOUNTER — Other Ambulatory Visit: Payer: Self-pay | Admitting: Family Medicine

## 2015-10-05 ENCOUNTER — Telehealth: Payer: Self-pay | Admitting: Family Medicine

## 2015-10-05 NOTE — Telephone Encounter (Signed)
Ronalee Belts at [facility] called.  He will be faxing a detailed written order.  It's for date of service 01/06/15.  Ronalee Belts is asking for this to be signed and returned.

## 2015-10-06 NOTE — Telephone Encounter (Signed)
Signed and in Kim's box. 

## 2015-10-06 NOTE — Telephone Encounter (Signed)
Form faxed

## 2015-10-09 ENCOUNTER — Other Ambulatory Visit: Payer: Self-pay | Admitting: Medical Oncology

## 2015-10-09 ENCOUNTER — Telehealth: Payer: Self-pay | Admitting: Medical Oncology

## 2015-10-09 DIAGNOSIS — Z23 Encounter for immunization: Secondary | ICD-10-CM

## 2015-10-09 NOTE — Telephone Encounter (Signed)
I notified wife that it is okay to get flu vaccine . Per [name] it is okay for flu vaccine and ok for pneumonia vaccine.

## 2015-10-10 ENCOUNTER — Ambulatory Visit (HOSPITAL_BASED_OUTPATIENT_CLINIC_OR_DEPARTMENT_OTHER): Payer: Medicare Other

## 2015-10-10 ENCOUNTER — Other Ambulatory Visit (HOSPITAL_BASED_OUTPATIENT_CLINIC_OR_DEPARTMENT_OTHER): Payer: Medicare Other

## 2015-10-10 VITALS — BP 126/76 | HR 89 | Temp 98.1°F

## 2015-10-10 DIAGNOSIS — Z23 Encounter for immunization: Secondary | ICD-10-CM

## 2015-10-10 DIAGNOSIS — C3412 Malignant neoplasm of upper lobe, left bronchus or lung: Secondary | ICD-10-CM | POA: Diagnosis present

## 2015-10-10 DIAGNOSIS — C3492 Malignant neoplasm of unspecified part of left bronchus or lung: Secondary | ICD-10-CM

## 2015-10-10 DIAGNOSIS — C787 Secondary malignant neoplasm of liver and intrahepatic bile duct: Secondary | ICD-10-CM

## 2015-10-10 LAB — CBC WITH DIFFERENTIAL/PLATELET
BASO%: 0.1 % (ref 0.0–2.0)
Basophils Absolute: 0 10*3/uL (ref 0.0–0.1)
EOS%: 0.1 % (ref 0.0–7.0)
Eosinophils Absolute: 0 10*3/uL (ref 0.0–0.5)
HEMATOCRIT: 35.4 % — AB (ref 38.4–49.9)
HEMOGLOBIN: 11.1 g/dL — AB (ref 13.0–17.1)
LYMPH#: 1 10*3/uL (ref 0.9–3.3)
LYMPH%: 7.7 % — ABNORMAL LOW (ref 14.0–49.0)
MCH: 29.5 pg (ref 27.2–33.4)
MCHC: 31.4 g/dL — ABNORMAL LOW (ref 32.0–36.0)
MCV: 94.1 fL (ref 79.3–98.0)
MONO#: 0.5 10*3/uL (ref 0.1–0.9)
MONO%: 3.9 % (ref 0.0–14.0)
NEUT%: 88.2 % — AB (ref 39.0–75.0)
NEUTROS ABS: 11.2 10*3/uL — AB (ref 1.5–6.5)
PLATELETS: 93 10*3/uL — AB (ref 140–400)
RBC: 3.76 10*6/uL — ABNORMAL LOW (ref 4.20–5.82)
RDW: 18 % — AB (ref 11.0–14.6)
WBC: 12.7 10*3/uL — AB (ref 4.0–10.3)

## 2015-10-10 LAB — COMPREHENSIVE METABOLIC PANEL (CC13)
ANION GAP: 6 meq/L (ref 3–11)
AST: 11 U/L (ref 5–34)
Albumin: 2.7 g/dL — ABNORMAL LOW (ref 3.5–5.0)
Alkaline Phosphatase: 93 U/L (ref 40–150)
BILIRUBIN TOTAL: 0.71 mg/dL (ref 0.20–1.20)
BUN: 11.1 mg/dL (ref 7.0–26.0)
CALCIUM: 8.4 mg/dL (ref 8.4–10.4)
CO2: 29 mEq/L (ref 22–29)
CREATININE: 0.8 mg/dL (ref 0.7–1.3)
Chloride: 106 mEq/L (ref 98–109)
EGFR: 87 mL/min/{1.73_m2} — ABNORMAL LOW (ref >90)
Glucose: 163 mg/dl — ABNORMAL HIGH (ref 70–140)
Potassium: 4.3 mEq/L (ref 3.5–5.1)
Sodium: 141 mEq/L (ref 136–145)
TOTAL PROTEIN: 5.4 g/dL — AB (ref 6.4–8.3)

## 2015-10-10 MED ORDER — INFLUENZA VAC SPLIT QUAD 0.5 ML IM SUSY
0.5000 mL | PREFILLED_SYRINGE | Freq: Once | INTRAMUSCULAR | Status: AC
  Administered 2015-10-10: 0.5 mL via INTRAMUSCULAR
  Filled 2015-10-10: qty 0.5

## 2015-10-11 ENCOUNTER — Telehealth: Payer: Self-pay | Admitting: Internal Medicine

## 2015-10-11 DIAGNOSIS — M3501 Sicca syndrome with keratoconjunctivitis: Secondary | ICD-10-CM | POA: Diagnosis not present

## 2015-10-11 DIAGNOSIS — H0259 Other disorders affecting eyelid function: Secondary | ICD-10-CM | POA: Diagnosis not present

## 2015-10-11 DIAGNOSIS — H04123 Dry eye syndrome of bilateral lacrimal glands: Secondary | ICD-10-CM | POA: Diagnosis not present

## 2015-10-11 NOTE — Telephone Encounter (Signed)
cld pt to adv of appt chge of time-adv moved to 9:15 lab and rerst of appts to follow

## 2015-10-16 ENCOUNTER — Encounter: Payer: Self-pay | Admitting: Nurse Practitioner

## 2015-10-16 ENCOUNTER — Telehealth: Payer: Self-pay | Admitting: Internal Medicine

## 2015-10-16 ENCOUNTER — Encounter: Payer: Self-pay | Admitting: *Deleted

## 2015-10-16 ENCOUNTER — Telehealth: Payer: Self-pay | Admitting: *Deleted

## 2015-10-16 ENCOUNTER — Other Ambulatory Visit (HOSPITAL_BASED_OUTPATIENT_CLINIC_OR_DEPARTMENT_OTHER): Payer: Medicare Other

## 2015-10-16 ENCOUNTER — Ambulatory Visit (HOSPITAL_BASED_OUTPATIENT_CLINIC_OR_DEPARTMENT_OTHER): Payer: Medicare Other | Admitting: Nurse Practitioner

## 2015-10-16 VITALS — BP 108/70 | HR 106 | Temp 97.5°F | Resp 20 | Ht 69.0 in

## 2015-10-16 DIAGNOSIS — R509 Fever, unspecified: Secondary | ICD-10-CM | POA: Diagnosis not present

## 2015-10-16 DIAGNOSIS — R5383 Other fatigue: Secondary | ICD-10-CM

## 2015-10-16 DIAGNOSIS — C3412 Malignant neoplasm of upper lobe, left bronchus or lung: Secondary | ICD-10-CM | POA: Diagnosis not present

## 2015-10-16 DIAGNOSIS — C3492 Malignant neoplasm of unspecified part of left bronchus or lung: Secondary | ICD-10-CM

## 2015-10-16 DIAGNOSIS — R05 Cough: Secondary | ICD-10-CM | POA: Diagnosis not present

## 2015-10-16 DIAGNOSIS — J40 Bronchitis, not specified as acute or chronic: Secondary | ICD-10-CM

## 2015-10-16 DIAGNOSIS — E46 Unspecified protein-calorie malnutrition: Secondary | ICD-10-CM | POA: Diagnosis not present

## 2015-10-16 DIAGNOSIS — C3490 Malignant neoplasm of unspecified part of unspecified bronchus or lung: Secondary | ICD-10-CM | POA: Diagnosis not present

## 2015-10-16 DIAGNOSIS — E8809 Other disorders of plasma-protein metabolism, not elsewhere classified: Secondary | ICD-10-CM | POA: Diagnosis not present

## 2015-10-16 DIAGNOSIS — R530 Neoplastic (malignant) related fatigue: Secondary | ICD-10-CM

## 2015-10-16 DIAGNOSIS — Z7901 Long term (current) use of anticoagulants: Secondary | ICD-10-CM | POA: Diagnosis not present

## 2015-10-16 DIAGNOSIS — R531 Weakness: Secondary | ICD-10-CM | POA: Diagnosis not present

## 2015-10-16 LAB — COMPREHENSIVE METABOLIC PANEL (CC13)
ALBUMIN: 2.3 g/dL — AB (ref 3.5–5.0)
ALK PHOS: 72 U/L (ref 40–150)
ALT: <9 U/L (ref 0–55)
AST: 11 U/L (ref 5–34)
Anion Gap: 11 mEq/L (ref 3–11)
BUN: 19.9 mg/dL (ref 7.0–26.0)
CALCIUM: 8.8 mg/dL (ref 8.4–10.4)
CO2: 25 mEq/L (ref 22–29)
CREATININE: 1.2 mg/dL (ref 0.7–1.3)
Chloride: 100 mEq/L (ref 98–109)
EGFR: 59 mL/min/{1.73_m2} — ABNORMAL LOW (ref >90)
Glucose: 119 mg/dl (ref 70–140)
POTASSIUM: 4.3 meq/L (ref 3.5–5.1)
Sodium: 135 mEq/L — ABNORMAL LOW (ref 136–145)
Total Bilirubin: 0.9 mg/dL (ref 0.20–1.20)
Total Protein: 5.6 g/dL — ABNORMAL LOW (ref 6.4–8.3)

## 2015-10-16 LAB — CBC WITH DIFFERENTIAL/PLATELET
BASO%: 0.4 % (ref 0.0–2.0)
BASOS ABS: 0.1 10*3/uL (ref 0.0–0.1)
EOS ABS: 0 10*3/uL (ref 0.0–0.5)
EOS%: 0 % (ref 0.0–7.0)
HEMATOCRIT: 33 % — AB (ref 38.4–49.9)
HEMOGLOBIN: 10.5 g/dL — AB (ref 13.0–17.1)
LYMPH#: 0.9 10*3/uL (ref 0.9–3.3)
LYMPH%: 5 % — ABNORMAL LOW (ref 14.0–49.0)
MCH: 28.6 pg (ref 27.2–33.4)
MCHC: 31.8 g/dL — ABNORMAL LOW (ref 32.0–36.0)
MCV: 89.8 fL (ref 79.3–98.0)
MONO#: 1.2 10*3/uL — AB (ref 0.1–0.9)
MONO%: 6.4 % (ref 0.0–14.0)
NEUT#: 15.9 10*3/uL — ABNORMAL HIGH (ref 1.5–6.5)
NEUT%: 88.2 % — ABNORMAL HIGH (ref 39.0–75.0)
Platelets: 212 10*3/uL (ref 140–400)
RBC: 3.68 10*6/uL — ABNORMAL LOW (ref 4.20–5.82)
RDW: 17 % — AB (ref 11.0–14.6)
WBC: 18.1 10*3/uL — ABNORMAL HIGH (ref 4.0–10.3)

## 2015-10-16 LAB — URINALYSIS, MICROSCOPIC - CHCC
BILIRUBIN (URINE): NEGATIVE
BLOOD: NEGATIVE
GLUCOSE UR CHCC: NEGATIVE mg/dL
KETONES: NEGATIVE mg/dL
Leukocyte Esterase: NEGATIVE
Nitrite: NEGATIVE
PH: 6 (ref 4.6–8.0)
Protein: NEGATIVE mg/dL
Specific Gravity, Urine: 1.01 (ref 1.003–1.035)
Urobilinogen, UR: 0.2 mg/dL (ref 0.2–1)

## 2015-10-16 MED ORDER — LEVOFLOXACIN 500 MG PO TABS: 500.0000 mg | ORAL_TABLET | Freq: Every day | ORAL | Status: DC

## 2015-10-16 MED ORDER — SUCRALFATE 1 GM/10ML PO SUSP: 1.0000 g | Freq: Three times a day (TID) | ORAL | Status: DC

## 2015-10-16 MED ORDER — TRAMADOL HCL 50 MG PO TABS: 50.0000 mg | ORAL_TABLET | Freq: Four times a day (QID) | ORAL | Status: DC | PRN

## 2015-10-16 NOTE — Progress Notes (Signed)
SYMPTOM MANAGEMENT CLINIC   HPI: Shawn Espinoza 74 y.o. male diagnosed with lung cancer.  He is currently undergoing Taxotere/Cyramza chemotherapy regimen.   Patient presents to the [facility] today with complaint of increased productive cough with thick yellow/brown secretions; and some occasional blood in the secretions as well.  He also states that he had a fever over the weekend to maximum 100.7.  He denies any nausea, vomiting, diarrhea, or constipation.  He denies any UTI symptoms.  He denies any chest pain, chest pressure, or pain with inspiration.  He is using home O2 at 2 L via nasal cannula today due to his progressively worsening weakness and mild shortness of breath.  Exam reveals slightly decreased breath sounds bilaterally; and some mild rhonchi.  There is no wheezing.  Patient has a congested cough with some thick yellow/brown/blood tinged secretions.  Patient is afebrile while at the [facility]; and all vital signs are stable.  HPI  ROS  Past Medical History  Diagnosis Date  . History of diabetes mellitus, type II     resolved with diet, h/o neuropathy  . Diverticulosis of colon   . Hyperlipidemia   . Hypertension   . Fatty liver 02/29/00    abd ultrasound  . Lower back pain   . Systolic murmur 3785    2Decho - normal LV fxn, EF 55%, mild AS, biatrial enlargement  . History of tobacco use quit 1990s  . 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 Personal history of colonic adenomas 07/06/2013  . Hypertensive retinopathy of both eyes 2015    mild  . Dysrhythmia     Atrial fib (found 07/2014)  . Shortness of breath   . Pneumonia   . GERD (gastroesophageal reflux disease)     occasional  . Arthritis   . Neuropathy ([place])   . Constipation   . 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 Diabetes mellitus without  complication (HCC)     Type 2  . Depression   . 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 Full code status 09/26/2015    Past Surgical History  Procedure Laterality Date  . Colonoscopy  2004  . Colonoscopy  2014    tubular adenoma x1, mod diverticulosis ([place])  . Flexible bronchoscopy  02/2014    WNL  . Video bronchoscopy Bilateral 03/17/2014    Procedure: VIDEO BRONCHOSCOPY WITH FLUORO;  Surgeon: Tanda Rockers, MD;  Location: [facility];  Service: Cardiopulmonary;  Laterality: Bilateral;  . Chest tube insertion Left 08/26/2014    Procedure: INSERTION OF LEFT  PLEURAL DRAINAGE CATHETER;  Surgeon: Ivin Poot, MD;  Location: [facility];  Service: Thoracic;  Laterality: Left;  . Cataract extraction Right 03/2015    Dr [name]  . Portacath placement Right 03/31/2015    Procedure: INSERTION PORT-A-CATH;  Surgeon: Ivin Poot, MD;  Location: [facility];  Service: Thoracic;  Laterality: Right;  . 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[place]); HLD (hyperlipidemia); History of tobacco use; Essential hypertension; Hemorrhoids; DIVERTICULOSIS, COLON; FATTY LIVER DISEASE; Medicare annual wellness visit,  subsequent; Positive hepatitis C antibody test; Systolic murmur; Lower back pain; Polycythemia; BPH (benign prostatic hypertrophy); Right knee pain; Personal history of colonic adenoma; DOE (dyspnea on exertion); Adenocarcinoma of left lung, stage 4 ([place]); Liver lesion; Recurrent left pleural effusion; Skin rash; Other pancytopenia ([place]); 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[place]); CHF (congestive heart failure) ([place]); Thrombocytopenia ([place]); Peripheral edema; Protein-calorie malnutrition ([place]); Orthostatic hypotension; Neoplastic malignant related fatigue; Atrial fibrillation, unspecified;  Hypothyroid; Thrush; Hearing loss; Advanced care planning/counseling discussion; Antineoplastic chemotherapy induced pancytopenia ([place]); Bilateral leg pain; Encounter for antineoplastic chemotherapy; Myalgia; Neutropenia, drug-induced ([place]); Altered mental state; Constipation; Long term current use of anticoagulant therapy; Esophagitis; Epistaxis; Dehydration; Full code status; Bronchitis; and Hypoalbuminemia due to protein-calorie malnutrition ([place]) on his problem list.    is allergic to candesartan cilexetil; diltiazem hcl; doxazosin mesylate; nifedipine; pravastatin; red yeast rice; rosuvastatin; sertraline; simvastatin; and hydrocodone.    Medication List       This list is accurate as of: 10/16/15  3:02 PM.  Always use your most recent med list.               albuterol 108 (90 BASE) MCG/ACT inhaler  Commonly known as:  PROVENTIL HFA;VENTOLIN HFA  Inhale 2 puffs into the lungs every 6 (six) hours as needed for wheezing or shortness of breath.     clotrimazole 1 % cream  Commonly known as:  LOTRIMIN  Apply 1 application topically 2 (two) times daily.     dexamethasone 4 MG tablet  Commonly known as:  DECADRON  2 tab po bid, the day before, day of and day after chemotherapy every 3 weeks     diphenoxylate-atropine 2.5-0.025 MG tablet  Commonly known as:  LOMOTIL  Take 2 tablets by mouth 4 (four) times daily as needed for diarrhea or loose stools.     ELIQUIS 5 MG Tabs tablet  Generic drug:  apixaban  TAKE 1 TABLET (5 MG TOTAL) BY MOUTH 2 (TWO) TIMES DAILY.     folic acid 1 MG tablet  Commonly known as:  FOLVITE  TAKE 1 TABLET BY MOUTH EVERY DAY     glimepiride 1 MG tablet  Commonly known as:  AMARYL  Take 1 mg by mouth daily with breakfast.     guaiFENesin 600 MG 12 hr tablet  Commonly known as:  MUCINEX  Take 600 mg by mouth 2 (two) times daily as needed.     hydrocortisone 25 MG suppository  Commonly known as:  ANUSOL-HC  Place 1 suppository (25 mg total) rectally  2 (two) times daily.     ICAPS Caps  Take 1 capsule by mouth 2 (two) times daily.     ketorolac 0.5 % ophthalmic solution  Commonly known as:  ACULAR  PLACE 1 DROP IN LEFT EYE 4 TIMES A DAY     KLOR-CON M10 10 MEQ tablet  Generic drug:  potassium chloride  Take 20 mEq by mouth daily as needed (with torsemide).     levofloxacin 500 MG tablet  Commonly known as:  LEVAQUIN  Take 1 tablet (500 mg total) by mouth daily.     lidocaine-prilocaine cream  Commonly known as:  EMLA  Apply 1 application topically as needed.     loratadine 10 MG tablet  Commonly known as:  CLARITIN  Take 10 mg by mouth as directed. 3 days before chemo and 4 days after     magic mouthwash w/lidocaine Soln  Take 5 mLs by mouth 3 (three) times daily as needed for mouth pain.  metoprolol tartrate 25 MG tablet  Commonly known as:  LOPRESSOR  TAKE 1 TABLET TWICE A DAY     polyethylene glycol packet  Commonly known as:  MIRALAX / GLYCOLAX  Take 8.5 g by mouth daily as needed for moderate constipation.     sucralfate 1 GM/10ML suspension  Commonly known as:  CARAFATE  Take 10 mLs (1 g total) by mouth 4 (four) times daily -  with meals and at bedtime.     torsemide 20 MG tablet  Commonly known as:  DEMADEX  Take 1 tablet (20 mg total) by mouth every other day.     traMADol 50 MG tablet  Commonly known as:  ULTRAM  Take 1 tablet (50 mg total) by mouth every 6 (six) hours as needed.         PHYSICAL EXAMINATION  Oncology Vitals 10/16/2015 10/10/2015 09/28/2015 09/26/2015 09/07/2015 09/05/2015 08/22/2015  Height 175 cm - - 175 cm - 175 cm 175 cm  Weight (No Data) - - 85.095 kg - 82.509 kg 78.926 kg  Weight (lbs) (No Data) - - 187 lbs 10 oz - 181 lbs 14 oz 174 lbs  BMI (kg/m2) - - - 27.7 kg/m2 - 26.86 kg/m2 25.7 kg/m2  Temp 97.5 98.1 97.7 97.9 97.6 97 97.6  Pulse 106 89 84 90 85 76 102  Resp 20 - 16 18 - 19 19  SpO2 88 - - 100 - 99 85  BSA (m2) - - - 2.04 m2 - 2 m2 1.96 m2   BP Readings from Last  3 Encounters:  10/16/15 108/70  10/10/15 126/76  09/28/15 162/85    Physical Exam  Constitutional: He is oriented to person, place, and time.  Patient appears fatigued, weak, frail, and chronically ill.  HENT:  Head: Normocephalic and atraumatic.  Mouth/Throat: Oropharynx is clear and moist.  Eyes: Conjunctivae and EOM are normal. Pupils are equal, round, and reactive to light. Right eye exhibits no discharge. Left eye exhibits no discharge. No scleral icterus.  Neck: Normal range of motion. Neck supple. No JVD present. No tracheal deviation present. No thyromegaly present.  Cardiovascular: Normal rate, regular rhythm, normal heart sounds and intact distal pulses.   Pulmonary/Chest: No respiratory distress. He has no wheezes. He has rales. He exhibits no tenderness.  Patient with some coarse breath sounds bilaterally; and mild shortness of breath.  Wearing O2 via nasal cannula at 2 L.  Abdominal: Soft. Bowel sounds are normal. He exhibits no distension and no mass. There is no tenderness. There is no rebound and no guarding.  Musculoskeletal: Normal range of motion. He exhibits tenderness. He exhibits no edema.  Complain of some very mild low left back pain with palpation and movement.  Denies any numbness, tingling, or incontinence issues.  Lymphadenopathy:    He has no cervical adenopathy.  Neurological: He is alert and oriented to person, place, and time.  Skin: Skin is warm and dry. No rash noted. No erythema. There is pallor.  Psychiatric: Affect normal.  Nursing note and vitals reviewed.   LABORATORY DATA:. Appointment on 10/16/2015  Component Date Value Ref Range Status  . WBC 10/16/2015 18.1* 4.0 - 10.3 10e3/uL Final  . NEUT# 10/16/2015 15.9* 1.5 - 6.5 10e3/uL Final  . HGB 10/16/2015 10.5* 13.0 - 17.1 g/dL Final  . HCT 10/16/2015 33.0* 38.4 - 49.9 % Final  . Platelets 10/16/2015 212  140 - 400 10e3/uL Final  . MCV 10/16/2015 89.8  79.3 - 98.0 fL Final  . MCH  10/16/2015  28.6  27.2 - 33.4 pg Final  . MCHC 10/16/2015 31.8* 32.0 - 36.0 g/dL Final  . RBC 10/16/2015 3.68* 4.20 - 5.82 10e6/uL Final  . RDW 10/16/2015 17.0* 11.0 - 14.6 % Final  . lymph# 10/16/2015 0.9  0.9 - 3.3 10e3/uL Final  . MONO# 10/16/2015 1.2* 0.1 - 0.9 10e3/uL Final  . Eosinophils Absolute 10/16/2015 0.0  0.0 - 0.5 10e3/uL Final  . Basophils Absolute 10/16/2015 0.1  0.0 - 0.1 10e3/uL Final  . NEUT% 10/16/2015 88.2* 39.0 - 75.0 % Final  . LYMPH% 10/16/2015 5.0* 14.0 - 49.0 % Final  . MONO% 10/16/2015 6.4  0.0 - 14.0 % Final  . EOS% 10/16/2015 0.0  0.0 - 7.0 % Final  . BASO% 10/16/2015 0.4  0.0 - 2.0 % Final  . Sodium 10/16/2015 135* 136 - 145 mEq/L Final  . Potassium 10/16/2015 4.3  3.5 - 5.1 mEq/L Final  . Chloride 10/16/2015 100  98 - 109 mEq/L Final  . CO2 10/16/2015 25  22 - 29 mEq/L Final  . Glucose 10/16/2015 119  70 - 140 mg/dl Final   Glucose reference range is for nonfasting patients. Fasting glucose reference range is 70- 100.  Marland Kitchen BUN 10/16/2015 19.9  7.0 - 26.0 mg/dL Final  . Creatinine 10/16/2015 1.2  0.7 - 1.3 mg/dL Final  . Total Bilirubin 10/16/2015 0.90  0.20 - 1.20 mg/dL Final  . Alkaline Phosphatase 10/16/2015 72  40 - 150 U/L Final  . AST 10/16/2015 11  5 - 34 U/L Final  . ALT 10/16/2015 <9  0 - 55 U/L Final  . Total Protein 10/16/2015 5.6* 6.4 - 8.3 g/dL Final  . Albumin 10/16/2015 2.3* 3.5 - 5.0 g/dL Final  . Calcium 10/16/2015 8.8  8.4 - 10.4 mg/dL Final  . Anion Gap 10/16/2015 11  3 - 11 mEq/L Final  . EGFR 10/16/2015 59* >90 ml/min/1.73 m2 Final   eGFR is calculated using the CKD-EPI Creatinine Equation (2009)  . Glucose 10/16/2015 Negative  Negative mg/dL Final  . Bilirubin (Urine) 10/16/2015 Negative  Negative Final  . Ketones 10/16/2015 Negative  Negative mg/dL Final  . Specific Gravity, Urine 10/16/2015 1.010  1.003 - 1.035 Final  . Blood 10/16/2015 Negative  Negative Final  . pH 10/16/2015 6.0  4.6 - 8.0 Final  . Protein 10/16/2015 Negative  Negative-  <30 mg/dL Final  . Urobilinogen, UR 10/16/2015 0.2  0.2 - 1 mg/dL Final  . Nitrite 10/16/2015 Negative  Negative Final  . Leukocyte Esterase 10/16/2015 Negative  Negative Final  . RBC / HPF 10/16/2015 0-2  0 - 2 Final  . WBC, UA 10/16/2015 0-2  0 - 2 Final  . Casts 10/16/2015 Hyaline  Negative Final     RADIOGRAPHIC STUDIES: No results found.  ASSESSMENT/PLAN:    Adenocarcinoma of left lung, stage 4 Patient received cycle 4 of his Taxotere/cyramza chemotherapy on 09/26/2015.  He received his Neulasta injection for growth factor support on 09/28/2015.  Patient presents to the [facility] today with complaint of increased productive cough with thick yellow/brown secretions; and some occasional blood in the secretions as well.  He also states that he had a fever over the weekend to maximum 100.7.  He denies any nausea, vomiting, diarrhea, or constipation.  He denies any UTI symptoms.  Please see further notes regarding fever workup.  Patient was initially scheduled to return tomorrow for a visit in chemotherapy; and a Neulasta injection on October 27.-But will cancel both of these appointments;  and hold all chemotherapy till next week.  Will need to schedule patient for labs, visit, and cycle 5 of his chemotherapy on 10/24/2015.  Neoplastic malignant related fatigue Patient feels that his chronic fatigue and deconditioning has actually progressed within the past several days.  He states that he has been feeling poorly; with a fever and increased productive cough since this past weekend.  Patient was encouraged to get out of bed; remain as active as possible.  He is also encouraged to push fluids is much as he could.  Long term current use of anticoagulant therapy Patient has history of atrial fib; and continues to take Eliquis as directed.  Bronchitis Patient presents to the [facility] today with complaint of increased productive cough with thick yellow/brown secretions; and some  occasional blood in the secretions as well.  He also states that he had a fever over the weekend to maximum 100.7.  He denies any nausea, vomiting, diarrhea, or constipation.  He denies any UTI symptoms.  He denies any chest pain, chest pressure, or pain with inspiration.  He is using home O2 at 2 L via nasal cannula today due to his progressively worsening weakness and mild shortness of breath.  Exam reveals slightly decreased breath sounds bilaterally; and some mild rhonchi.  There is no wheezing.  Patient has a congested cough with some thick yellow/brown/blood tinged secretions.  Patient is afebrile while at the [facility]; and all vital signs are stable.  Will treat patient with Levaquin antibiotics for probable bronchitis.  Patient and his wife both were advised to call/return or directly to the emergency department for any worsening symptoms whatsoever.   Hypoalbuminemia due to protein-calorie malnutrition ([place]) Albumen has decreased down to 2.3 today.  Patient was encouraged to push protein in his diet is much as possible.  Patient stated understanding of all instructions; and was in agreement with this plan of care. The patient knows to call the clinic with any problems, questions or concerns.   This was a shared visit with Dr. [name] today.   Total time spent with patient was 25 minutes;  with greater than 85 percent of that time spent in face to face counseling regarding patient's symptoms,  and coordination of care and follow up.  Disclaimer:This dictation was prepared with Dragon/digital dictation along with [employer]. Any transcriptional errors that result from this process are unintentional.  Drue Second, NP 10/16/2015   ADDENDUM: Hematology/Oncology Attending: I had a face to face encounter with the patient today. I recommended his care plan. This is a very pleasant 74 years old white male with metastatic non-small cell lung cancer, adenocarcinoma  currently undergoing systemic chemotherapy with docetaxel and Cyramza status post 4 cycles. He has been tolerating his treatment well and he has evidence of improvement of his disease on this regimen. He was supposed to start cycle #5 tomorrow. The patient presented today for evaluation after he had fever at home with a temp of 100.7 in addition to sore throat and cough productive of yellowish sputum. This is most likely acute bronchitis. We will start the patient on Levaquin 500 mg by mouth daily for 7 days. I will delay the start of cycle #5 by 1 week to give him more time to recover from his acute bronchitis. I also advised the patient to go immediately to the emergency department if there is any worsening or no improvement in his condition. He would come back for follow-up visit in one week for reevaluation before  starting cycle #5. He was advised to call if he has any concerning symptoms in the interval.  Disclaimer: This note was dictated with voice recognition software. Similar sounding words can inadvertently be transcribed and may be missed upon review. Eilleen Kempf., MD 10/16/2015

## 2015-10-16 NOTE — Assessment & Plan Note (Addendum)
Patient presents to the [facility] today with complaint of increased productive cough with thick yellow/brown secretions; and some occasional blood in the secretions as well.  He also states that he had a fever over the weekend to maximum 100.7.  He denies any nausea, vomiting, diarrhea, or constipation.  He denies any UTI symptoms.  He denies any chest pain, chest pressure, or pain with inspiration.  He is using home O2 at 2 L via nasal cannula today due to his progressively worsening weakness and mild shortness of breath.  Exam reveals slightly decreased breath sounds bilaterally; and some mild rhonchi.  There is no wheezing.  Patient has a congested cough with some thick yellow/brown/blood tinged secretions.  Patient is afebrile while at the [facility]; and all vital signs are stable.  Will treat patient with Levaquin antibiotics for probable bronchitis.  Patient and his wife both were advised to call/return or directly to the emergency department for any worsening symptoms whatsoever.

## 2015-10-16 NOTE — Telephone Encounter (Signed)
VM message received from wife, Kittie @ 8:15 am stating her husband has been sick all weekend with cough, fever, chills.  Return call to wife. She reviewed the events of the weekend which included fever to 100.7 on Saturday, worsening cough with dark yellow sputum, chills with and without a fever. He has not been out of bed much over the weekend. He is not eating much but is drinking some fluids.  Passing his urine w/o difficulty.  He refuses to go to ED. As pt refuses to go to ED, advised wife to bring her husband here to be seen in [facility] after having lab work done.  She is agreeable to this and she will have her husband here by 10:30 for labs and 10:45 for [place], Selena Lesser, NP

## 2015-10-16 NOTE — Assessment & Plan Note (Signed)
Albumen has decreased down to 2.3 today.  Patient was encouraged to push protein in his diet is much as possible.

## 2015-10-16 NOTE — Assessment & Plan Note (Signed)
Patient feels that his chronic fatigue and deconditioning has actually progressed within the past several days.  He states that he has been feeling poorly; with a fever and increased productive cough since this past weekend.  Patient was encouraged to get out of bed; remain as active as possible.  He is also encouraged to push fluids is much as he could.

## 2015-10-16 NOTE — Assessment & Plan Note (Signed)
Patient received cycle 4 of his Taxotere/cyramza chemotherapy on 09/26/2015.  He received his Neulasta injection for growth factor support on 09/28/2015.  Patient presents to the [facility] today with complaint of increased productive cough with thick yellow/brown secretions; and some occasional blood in the secretions as well.  He also states that he had a fever over the weekend to maximum 100.7.  He denies any nausea, vomiting, diarrhea, or constipation.  He denies any UTI symptoms.  Please see further notes regarding fever workup.  Patient was initially scheduled to return tomorrow for a visit in chemotherapy; and a Neulasta injection on October 27.-But will cancel both of these appointments; and hold all chemotherapy till next week.  Will need to schedule patient for labs, visit, and cycle 5 of his chemotherapy on 10/24/2015.

## 2015-10-16 NOTE — Telephone Encounter (Signed)
Spoke with patients wife and she is aware of his appointments

## 2015-10-16 NOTE — Assessment & Plan Note (Addendum)
Patient has history of atrial fib; and continues to take Eliquis as directed.

## 2015-10-17 ENCOUNTER — Ambulatory Visit: Payer: Medicare Other

## 2015-10-17 ENCOUNTER — Other Ambulatory Visit: Payer: Medicare Other

## 2015-10-17 ENCOUNTER — Ambulatory Visit: Payer: Medicare Other | Admitting: Internal Medicine

## 2015-10-17 ENCOUNTER — Inpatient Hospital Stay (HOSPITAL_COMMUNITY)
Admission: EM | Admit: 2015-10-17 | Discharge: 2015-10-20 | DRG: 871 | Disposition: A | Discharge status: Home / Self Care | Payer: Medicare Other | Attending: Internal Medicine | Admitting: Internal Medicine

## 2015-10-17 ENCOUNTER — Emergency Department (HOSPITAL_COMMUNITY): Payer: Medicare Other

## 2015-10-17 ENCOUNTER — Other Ambulatory Visit (HOSPITAL_COMMUNITY): Payer: Self-pay

## 2015-10-17 ENCOUNTER — Encounter (HOSPITAL_COMMUNITY): Payer: Self-pay | Admitting: Emergency Medicine

## 2015-10-17 ENCOUNTER — Other Ambulatory Visit: Payer: Self-pay

## 2015-10-17 DIAGNOSIS — A419 Sepsis, unspecified organism: Secondary | ICD-10-CM | POA: Diagnosis not present

## 2015-10-17 DIAGNOSIS — I4891 Unspecified atrial fibrillation: Secondary | ICD-10-CM | POA: Diagnosis present

## 2015-10-17 DIAGNOSIS — K760 Fatty (change of) liver, not elsewhere classified: Secondary | ICD-10-CM | POA: Diagnosis present

## 2015-10-17 DIAGNOSIS — D6481 Anemia due to antineoplastic chemotherapy: Secondary | ICD-10-CM | POA: Diagnosis present

## 2015-10-17 DIAGNOSIS — J181 Lobar pneumonia, unspecified organism: Secondary | ICD-10-CM | POA: Diagnosis present

## 2015-10-17 DIAGNOSIS — Z888 Allergy status to other drugs, medicaments and biological substances status: Secondary | ICD-10-CM

## 2015-10-17 DIAGNOSIS — Z7901 Long term (current) use of anticoagulants: Secondary | ICD-10-CM | POA: Diagnosis not present

## 2015-10-17 DIAGNOSIS — E860 Dehydration: Secondary | ICD-10-CM

## 2015-10-17 DIAGNOSIS — R7309 Other abnormal glucose: Secondary | ICD-10-CM | POA: Diagnosis not present

## 2015-10-17 DIAGNOSIS — Z823 Family history of stroke: Secondary | ICD-10-CM

## 2015-10-17 DIAGNOSIS — Z8249 Family history of ischemic heart disease and other diseases of the circulatory system: Secondary | ICD-10-CM | POA: Diagnosis not present

## 2015-10-17 DIAGNOSIS — T451X5A Adverse effect of antineoplastic and immunosuppressive drugs, initial encounter: Secondary | ICD-10-CM | POA: Diagnosis present

## 2015-10-17 DIAGNOSIS — I5030 Unspecified diastolic (congestive) heart failure: Secondary | ICD-10-CM | POA: Diagnosis not present

## 2015-10-17 DIAGNOSIS — M1990 Unspecified osteoarthritis, unspecified site: Secondary | ICD-10-CM | POA: Diagnosis present

## 2015-10-17 DIAGNOSIS — B1920 Unspecified viral hepatitis C without hepatic coma: Secondary | ICD-10-CM | POA: Diagnosis present

## 2015-10-17 DIAGNOSIS — E162 Hypoglycemia, unspecified: Secondary | ICD-10-CM | POA: Diagnosis not present

## 2015-10-17 DIAGNOSIS — I700 Atherosclerosis of aorta: Secondary | ICD-10-CM | POA: Diagnosis present

## 2015-10-17 DIAGNOSIS — I509 Heart failure, unspecified: Secondary | ICD-10-CM | POA: Diagnosis present

## 2015-10-17 DIAGNOSIS — H3530 Unspecified macular degeneration: Secondary | ICD-10-CM | POA: Diagnosis present

## 2015-10-17 DIAGNOSIS — E871 Hypo-osmolality and hyponatremia: Secondary | ICD-10-CM | POA: Diagnosis present

## 2015-10-17 DIAGNOSIS — R739 Hyperglycemia, unspecified: Secondary | ICD-10-CM | POA: Diagnosis not present

## 2015-10-17 DIAGNOSIS — K5900 Constipation, unspecified: Secondary | ICD-10-CM | POA: Diagnosis present

## 2015-10-17 DIAGNOSIS — C3492 Malignant neoplasm of unspecified part of left bronchus or lung: Secondary | ICD-10-CM | POA: Diagnosis not present

## 2015-10-17 DIAGNOSIS — Z87891 Personal history of nicotine dependence: Secondary | ICD-10-CM | POA: Diagnosis not present

## 2015-10-17 DIAGNOSIS — J449 Chronic obstructive pulmonary disease, unspecified: Secondary | ICD-10-CM | POA: Diagnosis present

## 2015-10-17 DIAGNOSIS — Z9981 Dependence on supplemental oxygen: Secondary | ICD-10-CM

## 2015-10-17 DIAGNOSIS — Z885 Allergy status to narcotic agent status: Secondary | ICD-10-CM

## 2015-10-17 DIAGNOSIS — R05 Cough: Secondary | ICD-10-CM | POA: Diagnosis not present

## 2015-10-17 DIAGNOSIS — I10 Essential (primary) hypertension: Secondary | ICD-10-CM | POA: Diagnosis present

## 2015-10-17 DIAGNOSIS — I110 Hypertensive heart disease with heart failure: Secondary | ICD-10-CM | POA: Diagnosis present

## 2015-10-17 DIAGNOSIS — N179 Acute kidney failure, unspecified: Secondary | ICD-10-CM | POA: Diagnosis not present

## 2015-10-17 DIAGNOSIS — I350 Nonrheumatic aortic (valve) stenosis: Secondary | ICD-10-CM | POA: Diagnosis not present

## 2015-10-17 DIAGNOSIS — E785 Hyperlipidemia, unspecified: Secondary | ICD-10-CM | POA: Diagnosis present

## 2015-10-17 DIAGNOSIS — R509 Fever, unspecified: Secondary | ICD-10-CM | POA: Diagnosis not present

## 2015-10-17 DIAGNOSIS — E861 Hypovolemia: Secondary | ICD-10-CM | POA: Diagnosis present

## 2015-10-17 DIAGNOSIS — L89153 Pressure ulcer of sacral region, stage 3: Secondary | ICD-10-CM | POA: Diagnosis present

## 2015-10-17 DIAGNOSIS — Z79891 Long term (current) use of opiate analgesic: Secondary | ICD-10-CM

## 2015-10-17 DIAGNOSIS — K219 Gastro-esophageal reflux disease without esophagitis: Secondary | ICD-10-CM | POA: Diagnosis present

## 2015-10-17 DIAGNOSIS — E11649 Type 2 diabetes mellitus with hypoglycemia without coma: Secondary | ICD-10-CM | POA: Diagnosis present

## 2015-10-17 DIAGNOSIS — L89159 Pressure ulcer of sacral region, unspecified stage: Secondary | ICD-10-CM

## 2015-10-17 DIAGNOSIS — Z79899 Other long term (current) drug therapy: Secondary | ICD-10-CM | POA: Diagnosis not present

## 2015-10-17 DIAGNOSIS — Z23 Encounter for immunization: Secondary | ICD-10-CM

## 2015-10-17 LAB — I-STAT CHEM 8, ED
BUN: 24 mg/dL — AB (ref 6–20)
CALCIUM ION: 1.09 mmol/L — AB (ref 1.13–1.30)
CREATININE: 1.4 mg/dL — AB (ref 0.61–1.24)
Chloride: 101 mmol/L (ref 101–111)
Glucose, Bld: 49 mg/dL — ABNORMAL LOW (ref 65–99)
HEMATOCRIT: 28 % — AB (ref 39.0–52.0)
HEMOGLOBIN: 9.5 g/dL — AB (ref 13.0–17.0)
Potassium: 3.8 mmol/L (ref 3.5–5.1)
SODIUM: 136 mmol/L (ref 135–145)
TCO2: 20 mmol/L (ref 0–100)

## 2015-10-17 LAB — COMPREHENSIVE METABOLIC PANEL
ALBUMIN: 2.2 g/dL — AB (ref 3.5–5.0)
ALK PHOS: 59 U/L (ref 38–126)
ALT: 8 U/L — AB (ref 17–63)
AST: 24 U/L (ref 15–41)
Anion gap: 8 (ref 5–15)
BUN: 27 mg/dL — ABNORMAL HIGH (ref 6–20)
CALCIUM: 7.7 mg/dL — AB (ref 8.9–10.3)
CHLORIDE: 102 mmol/L (ref 101–111)
CO2: 27 mmol/L (ref 22–32)
CREATININE: 1.41 mg/dL — AB (ref 0.61–1.24)
GFR calc Af Amer: 55 mL/min — ABNORMAL LOW (ref >60)
GFR calc non Af Amer: 48 mL/min — ABNORMAL LOW (ref >60)
GLUCOSE: 71 mg/dL (ref 65–99)
Potassium: 3.6 mmol/L (ref 3.5–5.1)
SODIUM: 137 mmol/L (ref 135–145)
Total Bilirubin: 0.6 mg/dL (ref 0.3–1.2)
Total Protein: 4.9 g/dL — ABNORMAL LOW (ref 6.5–8.1)

## 2015-10-17 LAB — URINE CULTURE

## 2015-10-17 LAB — URINALYSIS, ROUTINE W REFLEX MICROSCOPIC
BILIRUBIN URINE: NEGATIVE
Glucose, UA: NEGATIVE mg/dL
HGB URINE DIPSTICK: NEGATIVE
Ketones, ur: NEGATIVE mg/dL
Leukocytes, UA: NEGATIVE
Nitrite: NEGATIVE
PH: 5.5 (ref 5.0–8.0)
Protein, ur: NEGATIVE mg/dL
SPECIFIC GRAVITY, URINE: 1.012 (ref 1.005–1.030)
Urobilinogen, UA: 0.2 mg/dL (ref 0.0–1.0)

## 2015-10-17 LAB — CBG MONITORING, ED
GLUCOSE-CAPILLARY: 39 mg/dL — AB (ref 65–99)
GLUCOSE-CAPILLARY: 46 mg/dL — AB (ref 65–99)
Glucose-Capillary: 47 mg/dL — ABNORMAL LOW (ref 65–99)
Glucose-Capillary: 62 mg/dL — ABNORMAL LOW (ref 65–99)

## 2015-10-17 LAB — I-STAT TROPONIN, ED: Troponin i, poc: 0 ng/mL (ref 0.00–0.08)

## 2015-10-17 LAB — CBC WITH DIFFERENTIAL/PLATELET
BASOS ABS: 0 10*3/uL (ref 0.0–0.1)
Basophils Relative: 0 %
Eosinophils Absolute: 0 10*3/uL (ref 0.0–0.7)
Eosinophils Relative: 0 %
HCT: 27.6 % — ABNORMAL LOW (ref 39.0–52.0)
HEMOGLOBIN: 8.8 g/dL — AB (ref 13.0–17.0)
LYMPHS PCT: 4 %
Lymphs Abs: 0.4 10*3/uL — ABNORMAL LOW (ref 0.7–4.0)
MCH: 29.3 pg (ref 26.0–34.0)
MCHC: 31.9 g/dL (ref 30.0–36.0)
MCV: 92 fL (ref 78.0–100.0)
MONOS PCT: 5 %
Monocytes Absolute: 0.5 10*3/uL (ref 0.1–1.0)
NEUTROS ABS: 8.5 10*3/uL — AB (ref 1.7–7.7)
NEUTROS PCT: 91 %
PLATELETS: 115 10*3/uL — AB (ref 150–400)
RBC: 3 MIL/uL — AB (ref 4.22–5.81)
RDW: 15.9 % — ABNORMAL HIGH (ref 11.5–15.5)
WBC: 9.4 10*3/uL (ref 4.0–10.5)

## 2015-10-17 LAB — I-STAT CG4 LACTIC ACID, ED: Lactic Acid, Venous: 2.11 mmol/L (ref 0.5–2.0)

## 2015-10-17 MED ORDER — DEXTROSE 5 % IV SOLN
Freq: Once | INTRAVENOUS | Status: AC
  Administered 2015-10-17: 23:00:00 via INTRAVENOUS

## 2015-10-17 MED ORDER — DEXTROSE 50 % IV SOLN
1.0000 | Freq: Once | INTRAVENOUS | Status: AC
  Administered 2015-10-17: 50 mL via INTRAVENOUS
  Filled 2015-10-17: qty 50

## 2015-10-17 MED ORDER — SODIUM CHLORIDE 0.9 % IV BOLUS (SEPSIS)
1000.0000 mL | Freq: Once | INTRAVENOUS | Status: AC
  Administered 2015-10-17: 1000 mL via INTRAVENOUS

## 2015-10-17 MED ORDER — GLUCAGON HCL RDNA (DIAGNOSTIC) 1 MG IJ SOLR
1.0000 mg | Freq: Once | INTRAMUSCULAR | Status: DC
Start: 2015-10-17 — End: 2015-10-17

## 2015-10-17 NOTE — ED Notes (Signed)
Patient ambulatory to restroom without assistance, steady gait.

## 2015-10-17 NOTE — ED Notes (Signed)
Bed: EQ33 Expected date:  Expected time:  Means of arrival:  Comments: Ems-hypoglycemia

## 2015-10-17 NOTE — ED Notes (Signed)
Pts lactid acid is 2.11 Dr. Betsey [facility] made aware

## 2015-10-17 NOTE — ED Notes (Signed)
Pt given 8oz of orange juice, currently tolerating PO fluids well. A&Ox4, denies pain, not diaphoretic.

## 2015-10-17 NOTE — ED Provider Notes (Signed)
CSN: [id number]     Arrival date & time 10/17/15  1933 History   First MD Initiated Contact with Patient 10/17/15 2028     Chief complaint: Hypoglycemia   HPI   Mr. Celmer is a 74 yo M PMH DMII, hyperlipidemia, HTN, lung CA (stage IIIb/IV on chemo) BIB EMS pw hypoglycemia and seizure like activity. Per EMS, patient's blood glucose was 34 upon arrival. Patient was given 25 ml D50. Patient's wife is at bedside, and she explains that the patient has had productive cough, fevers, chills, as well as decreased appetite for 3 days. She gave him glimepiride last night, and was concerned when he started shaking at home this morning.  Past Medical History  Diagnosis Date  . History of diabetes mellitus, type II     resolved with diet, h/o neuropathy  . Diverticulosis of colon   . Hyperlipidemia   . Hypertension   . Fatty liver 02/29/00    abd ultrasound  . Lower back pain   . Systolic murmur 5621    2Decho - normal LV fxn, EF 55%, mild AS, biatrial enlargement  . History of tobacco use quit 1990s  . 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 Personal history of colonic adenomas 07/06/2013  . Hypertensive retinopathy of both eyes 2015    mild  . Dysrhythmia     Atrial fib (found 07/2014)  . Shortness of breath   . Pneumonia   . GERD (gastroesophageal reflux disease)     occasional  . Arthritis   . Neuropathy ([place])   . Constipation   . 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 Diabetes mellitus without complication (HCC)     Type 2  . Depression   . 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 Full code status 09/26/2015   Past Surgical History  Procedure Laterality Date  . Colonoscopy  2004  . Colonoscopy  2014    tubular adenoma x1, mod diverticulosis ([place])  . Flexible bronchoscopy  02/2014    WNL  .  Video bronchoscopy Bilateral 03/17/2014    Procedure: VIDEO BRONCHOSCOPY WITH FLUORO;  Surgeon: Tanda Rockers, MD;  Location: [facility];  Service: Cardiopulmonary;  Laterality: Bilateral;  . Chest tube insertion Left 08/26/2014    Procedure: INSERTION OF LEFT  PLEURAL DRAINAGE CATHETER;  Surgeon: Ivin Poot, MD;  Location: [facility];  Service: Thoracic;  Laterality: Left;  . Cataract extraction Right 03/2015    Dr [name]  . Portacath placement Right 03/31/2015    Procedure: INSERTION PORT-A-CATH;  Surgeon: Ivin Poot, MD;  Location: [facility] OR;  Service: Thoracic;  Laterality: Right;  . Removal of pleural drainage catheter Left 03/31/2015    Procedure: REMOVAL OF PLEURAL DRAINAGE CATHETER;  Surgeon: Ivin Poot, MD;  Location: [facility] OR;  Service: Thoracic;  Laterality: Left;   Family History  Problem Relation Age of Onset  . Stroke Mother     multiple mini strokes  . Hypertension Mother   . Heart disease Maternal Grandfather     MI  . Stroke Paternal Grandmother   . Colon cancer Neg Hx    Social History  Substance Use Topics  . Smoking status: Former Smoker -- 3.00 packs/day for 30 years    Types: Cigarettes  Quit date: 12/23/1988  . Smokeless tobacco: Never Used  . Alcohol Use: 12.6 oz/week    21 Glasses of wine per week     Comment: 3-4 wine a day ( 8/03- 2 beers, 2 wines, 2 brandies daily)    Review of Systems  Constitutional: Positive for fever, chills, appetite change and fatigue. Negative for diaphoresis, activity change and unexpected weight change.  HENT: Negative.   Eyes: Negative.   Respiratory: Positive for cough. Negative for apnea, choking, chest tightness, shortness of breath, wheezing and stridor.   Cardiovascular: Negative.   Gastrointestinal: Negative.   Endocrine: Negative.   Genitourinary: Negative.   Musculoskeletal: Negative.   Skin: Negative.   Allergic/Immunologic: Negative.   Neurological: Negative.   Hematological: Negative.    Psychiatric/Behavioral: Negative.       Allergies  Candesartan cilexetil; Diltiazem hcl; Doxazosin mesylate; Nifedipine; Pravastatin; Red yeast rice; Rosuvastatin; Sertraline; Simvastatin; and Hydrocodone  Home Medications   Prior to Admission medications   Medication Sig Start Date End Date Taking? Authorizing Provider  albuterol (PROVENTIL HFA;VENTOLIN HFA) 108 (90 BASE) MCG/ACT inhaler Inhale 2 puffs into the lungs every 6 (six) hours as needed for wheezing or shortness of breath. 09/08/15  Yes Carlton Adam, PA-C  Alum & Mag Hydroxide-Simeth (MAGIC MOUTHWASH W/LIDOCAINE) SOLN Take 5 mLs by mouth 3 (three) times daily as needed for mouth pain. 03/03/15  Yes Curt Bears, MD  clotrimazole (LOTRIMIN) 1 % cream Apply 1 application topically 2 (two) times daily. 09/08/15  Yes [name], MD  dexamethasone (DECADRON) 4 MG tablet 2 tab po bid, the day before, day of and day after chemotherapy every 3 weeks 07/11/15  Yes Curt Bears, MD  ELIQUIS 5 MG TABS tablet TAKE 1 TABLET (5 MG TOTAL) BY MOUTH 2 (TWO) TIMES DAILY. 10/04/15  Yes [name], MD  fluorometholone (FML) 0.1 % ophthalmic suspension Place 1 drop into both eyes 2 (two) times daily. 10/11/15  Yes Historical Provider, MD  folic acid (FOLVITE) 1 MG tablet TAKE 1 TABLET BY MOUTH EVERY DAY 07/25/15  Yes Curt Bears, MD  glimepiride (AMARYL) 1 MG tablet Take 1 mg by mouth daily with breakfast.   Yes Historical Provider, MD  guaiFENesin (MUCINEX) 600 MG 12 hr tablet Take 600 mg by mouth 2 (two) times daily as needed for cough or to loosen phlegm.    Yes Historical Provider, MD  KLOR-CON M10 10 MEQ tablet Take 20 mEq by mouth daily as needed (with torsemide).  05/02/15  Yes Historical Provider, MD  levofloxacin (LEVAQUIN) 500 MG tablet Take 1 tablet (500 mg total) by mouth daily. 10/16/15  Yes Susanne Borders, NP  lidocaine-prilocaine (EMLA) cream Apply 1 application topically as needed. 04/04/15  Yes Adrena E Johnson,  PA-C  loratadine (CLARITIN) 10 MG tablet Take 10 mg by mouth as directed. 3 days before chemo and 4 days after   Yes Historical Provider, MD  metoprolol tartrate (LOPRESSOR) 25 MG tablet TAKE 1 TABLET TWICE A DAY 09/04/15  Yes [name], MD  Multiple Vitamins-Minerals (ICAPS) CAPS Take 1 capsule by mouth 2 (two) times daily.    Yes Historical Provider, MD  polyethylene glycol (MIRALAX / GLYCOLAX) packet Take 8.5 g by mouth daily as needed for moderate constipation.    Yes Historical Provider, MD  sucralfate (CARAFATE) 1 GM/10ML suspension Take 10 mLs (1 g total) by mouth 4 (four) times daily -  with meals and at bedtime. 10/16/15  Yes Susanne Borders, NP  torsemide (DEMADEX) 20 MG  tablet Take 20 mg by mouth 2 (two) times daily. Takes one tablet daily and if swelling continues he takes a second pill 08/18/15  Yes [name], MD  traMADol (ULTRAM) 50 MG tablet Take 1 tablet (50 mg total) by mouth every 6 (six) hours as needed. Patient taking differently: Take 50 mg by mouth at bedtime.  10/16/15  Yes Susanne Borders, NP  diphenoxylate-atropine (LOMOTIL) 2.5-0.025 MG per tablet Take 2 tablets by mouth 4 (four) times daily as needed for diarrhea or loose stools. Patient not taking: Reported on 10/16/2015 01/17/15   Susanne Borders, NP  hydrocortisone (ANUSOL-HC) 25 MG suppository Place 1 suppository (25 mg total) rectally 2 (two) times daily. Patient not taking: Reported on 10/16/2015 07/26/15   Jearld Fenton, NP   BP 93/69 mmHg  Pulse 86  Temp(Src) 97.2 F (36.2 C) (Oral)  Resp 20  SpO2 100% Physical Exam  Constitutional: He appears well-developed and well-nourished. No distress.  HENT:  Head: Normocephalic and atraumatic.  Right Ear: External ear normal.  Left Ear: External ear normal.  Nose: Nose normal.  Mouth/Throat: Oropharynx is clear and moist. No oropharyngeal exudate.  Eyes: Conjunctivae are normal. Pupils are equal, round, and reactive to light. Right eye exhibits no  discharge. Left eye exhibits no discharge. No scleral icterus.  Neck: Normal range of motion. No tracheal deviation present.  Cardiovascular: Normal rate, regular rhythm, normal heart sounds and intact distal pulses.  Exam reveals no gallop and no friction rub.   No murmur heard. Pulmonary/Chest: Effort normal and breath sounds normal. No respiratory distress. He has no wheezes. He has no rales. He exhibits no tenderness.  Abdominal: Soft. Bowel sounds are normal. He exhibits no distension and no mass. There is no tenderness. There is no rebound and no guarding.  Musculoskeletal: Normal range of motion. He exhibits edema. He exhibits no tenderness.  Edema LLE  Lymphadenopathy:    He has no cervical adenopathy.  Neurological: He is alert. No cranial nerve deficit. Coordination normal.  Skin: Skin is warm and dry. No rash noted. He is not diaphoretic. No erythema.  Psychiatric: He has a normal mood and affect. His behavior is normal.  Nursing note and vitals reviewed.   ED Course  Procedures (including critical care time) Labs Review Labs Reviewed  CBG MONITORING, ED - Abnormal; Notable for the following:    Glucose-Capillary 62 (*)    All other components within normal limits  I-STAT CG4 LACTIC ACID, ED - Abnormal; Notable for the following:    Lactic Acid, Venous 2.11 (*)    All other components within normal limits  CBC WITH DIFFERENTIAL/PLATELET  COMPREHENSIVE METABOLIC PANEL  URINALYSIS, ROUTINE W REFLEX MICROSCOPIC (NOT AT [facility])  Randolm Idol, ED    Imaging Review DG Chest 2 View (Final result) Result time: 10/18/15 00:10:55   Final result by Rad Results In Interface (10/18/15 00:10:55)   Narrative:   CLINICAL DATA: Acute onset of cough and fever. Initial encounter.  EXAM: CHEST 2 VIEW  COMPARISON: Chest radiograph performed 07/31/2015, and CT of the chest performed 09/22/2015  FINDINGS: The lungs are well-aerated. A small left pleural effusion is  noted. Mild left perihilar and left basilar airspace opacities may reflect pneumonia, given the patient's symptoms. The right lung appears relatively clear. No pneumothorax is seen.  The heart is borderline normal in size. A right-sided chest port is noted ending about the distal SVC. No acute osseous abnormalities are seen.  IMPRESSION: Small left pleural effusion noted.  Mild left perihilar and left basilar airspace opacities raise concern for pneumonia, given the patient's symptoms.   Electronically Signed By: Garald Balding M.D. On: 10/18/2015 00:10   I have personally reviewed and evaluated these images and lab results as part of my medical decision-making.  MDM   Final diagnoses:  Hypoglycemia  Dehydration  Sepsis ([place])   Patient hypotensive. Will give IVF and monitor BP.   Unsure of hypoglycemia etiology. Likely dehydration and decreased appetite, but will evaluate for other causes. Will order CBC, lactic acid, troponin, CMP, urinalysis, monitor CBG, EKG, CXR.  Patient continues to be hypoglycemic despite 2 amps of D50 and D5. Patient switched to D10. Patient tolerating sandwich, orange juice, sugar gummies (from home).    CXR shows possible pneumonia. Patient took Levaquin this morning.   Jeannett Senior, PA-C, examined patient as well. Dr. Betsey Holiday advises hospital admission for hypoglycemia monitoring.   Hospitalist consulted and will admit. Patient and family understanding and in agreement with plan.   Waterflow Lions, PA-C 10/23/15 2346  Orpah Greek, MD 10/26/15 [phone]

## 2015-10-17 NOTE — ED Notes (Signed)
Patient presents from home via EMS for hypoglycemia. Per EMS cbg 34, 25 D50 given on site, Hx of DM, cold like symptoms x3 days.   20g right AC 500cc NS in route  Last VS 101/61, 84hr, 96%ra, cbg 209

## 2015-10-17 NOTE — ED Notes (Signed)
Patient given applesauce and orange juice. Encouraged to eat something else. Denies same.

## 2015-10-18 ENCOUNTER — Inpatient Hospital Stay (HOSPITAL_COMMUNITY): Payer: Medicare Other

## 2015-10-18 DIAGNOSIS — A419 Sepsis, unspecified organism: Secondary | ICD-10-CM | POA: Diagnosis present

## 2015-10-18 DIAGNOSIS — L89153 Pressure ulcer of sacral region, stage 3: Secondary | ICD-10-CM | POA: Diagnosis present

## 2015-10-18 DIAGNOSIS — J449 Chronic obstructive pulmonary disease, unspecified: Secondary | ICD-10-CM | POA: Diagnosis present

## 2015-10-18 DIAGNOSIS — L89159 Pressure ulcer of sacral region, unspecified stage: Secondary | ICD-10-CM

## 2015-10-18 DIAGNOSIS — Z823 Family history of stroke: Secondary | ICD-10-CM | POA: Diagnosis not present

## 2015-10-18 DIAGNOSIS — I5030 Unspecified diastolic (congestive) heart failure: Secondary | ICD-10-CM

## 2015-10-18 DIAGNOSIS — Z87891 Personal history of nicotine dependence: Secondary | ICD-10-CM | POA: Diagnosis not present

## 2015-10-18 DIAGNOSIS — I350 Nonrheumatic aortic (valve) stenosis: Secondary | ICD-10-CM

## 2015-10-18 DIAGNOSIS — T451X5A Adverse effect of antineoplastic and immunosuppressive drugs, initial encounter: Secondary | ICD-10-CM | POA: Diagnosis present

## 2015-10-18 DIAGNOSIS — E785 Hyperlipidemia, unspecified: Secondary | ICD-10-CM

## 2015-10-18 DIAGNOSIS — I110 Hypertensive heart disease with heart failure: Secondary | ICD-10-CM | POA: Diagnosis present

## 2015-10-18 DIAGNOSIS — K5900 Constipation, unspecified: Secondary | ICD-10-CM | POA: Diagnosis present

## 2015-10-18 DIAGNOSIS — C3492 Malignant neoplasm of unspecified part of left bronchus or lung: Secondary | ICD-10-CM | POA: Diagnosis not present

## 2015-10-18 DIAGNOSIS — R509 Fever, unspecified: Secondary | ICD-10-CM | POA: Diagnosis not present

## 2015-10-18 DIAGNOSIS — E11649 Type 2 diabetes mellitus with hypoglycemia without coma: Secondary | ICD-10-CM | POA: Diagnosis present

## 2015-10-18 DIAGNOSIS — E162 Hypoglycemia, unspecified: Secondary | ICD-10-CM

## 2015-10-18 DIAGNOSIS — J181 Lobar pneumonia, unspecified organism: Secondary | ICD-10-CM

## 2015-10-18 DIAGNOSIS — Z7901 Long term (current) use of anticoagulants: Secondary | ICD-10-CM | POA: Diagnosis not present

## 2015-10-18 DIAGNOSIS — E860 Dehydration: Secondary | ICD-10-CM | POA: Diagnosis present

## 2015-10-18 DIAGNOSIS — Z79891 Long term (current) use of opiate analgesic: Secondary | ICD-10-CM | POA: Diagnosis not present

## 2015-10-18 DIAGNOSIS — K760 Fatty (change of) liver, not elsewhere classified: Secondary | ICD-10-CM | POA: Diagnosis present

## 2015-10-18 DIAGNOSIS — Z8249 Family history of ischemic heart disease and other diseases of the circulatory system: Secondary | ICD-10-CM | POA: Diagnosis not present

## 2015-10-18 DIAGNOSIS — Z885 Allergy status to narcotic agent status: Secondary | ICD-10-CM | POA: Diagnosis not present

## 2015-10-18 DIAGNOSIS — R05 Cough: Secondary | ICD-10-CM | POA: Diagnosis not present

## 2015-10-18 DIAGNOSIS — D6481 Anemia due to antineoplastic chemotherapy: Secondary | ICD-10-CM | POA: Diagnosis not present

## 2015-10-18 DIAGNOSIS — Z888 Allergy status to other drugs, medicaments and biological substances status: Secondary | ICD-10-CM | POA: Diagnosis not present

## 2015-10-18 DIAGNOSIS — I4891 Unspecified atrial fibrillation: Secondary | ICD-10-CM | POA: Diagnosis present

## 2015-10-18 DIAGNOSIS — E871 Hypo-osmolality and hyponatremia: Secondary | ICD-10-CM | POA: Diagnosis present

## 2015-10-18 DIAGNOSIS — H3530 Unspecified macular degeneration: Secondary | ICD-10-CM | POA: Diagnosis present

## 2015-10-18 DIAGNOSIS — N179 Acute kidney failure, unspecified: Secondary | ICD-10-CM | POA: Diagnosis not present

## 2015-10-18 DIAGNOSIS — M1990 Unspecified osteoarthritis, unspecified site: Secondary | ICD-10-CM | POA: Diagnosis present

## 2015-10-18 DIAGNOSIS — Z79899 Other long term (current) drug therapy: Secondary | ICD-10-CM | POA: Diagnosis not present

## 2015-10-18 DIAGNOSIS — Z9981 Dependence on supplemental oxygen: Secondary | ICD-10-CM | POA: Diagnosis not present

## 2015-10-18 DIAGNOSIS — I10 Essential (primary) hypertension: Secondary | ICD-10-CM | POA: Diagnosis not present

## 2015-10-18 DIAGNOSIS — I700 Atherosclerosis of aorta: Secondary | ICD-10-CM | POA: Diagnosis present

## 2015-10-18 DIAGNOSIS — B1920 Unspecified viral hepatitis C without hepatic coma: Secondary | ICD-10-CM | POA: Diagnosis present

## 2015-10-18 DIAGNOSIS — K219 Gastro-esophageal reflux disease without esophagitis: Secondary | ICD-10-CM | POA: Diagnosis present

## 2015-10-18 DIAGNOSIS — I509 Heart failure, unspecified: Secondary | ICD-10-CM | POA: Diagnosis present

## 2015-10-18 DIAGNOSIS — E861 Hypovolemia: Secondary | ICD-10-CM | POA: Diagnosis present

## 2015-10-18 HISTORY — DX: Lobar pneumonia, unspecified organism: J18.1

## 2015-10-18 LAB — GLUCOSE, CAPILLARY
GLUCOSE-CAPILLARY: 71 mg/dL (ref 65–99)
GLUCOSE-CAPILLARY: 39 mg/dL — AB (ref 65–99)
GLUCOSE-CAPILLARY: 43 mg/dL — AB (ref 65–99)
Glucose-Capillary: 118 mg/dL — ABNORMAL HIGH (ref 65–99)
Glucose-Capillary: 43 mg/dL — CL (ref 65–99)
Glucose-Capillary: 100 mg/dL — ABNORMAL HIGH (ref 65–99)
Glucose-Capillary: 163 mg/dL — ABNORMAL HIGH (ref 65–99)
Glucose-Capillary: 235 mg/dL — ABNORMAL HIGH (ref 65–99)

## 2015-10-18 LAB — CBC WITH DIFFERENTIAL/PLATELET
BASOS ABS: 0 10*3/uL (ref 0.0–0.1)
BASOS PCT: 0 %
EOS ABS: 0 10*3/uL (ref 0.0–0.7)
Eosinophils Relative: 0 %
HEMATOCRIT: 23 % — AB (ref 39.0–52.0)
HEMOGLOBIN: 7.3 g/dL — AB (ref 13.0–17.0)
Lymphocytes Relative: 8 %
Lymphs Abs: 0.5 10*3/uL — ABNORMAL LOW (ref 0.7–4.0)
MCH: 28.9 pg (ref 26.0–34.0)
MCHC: 31.7 g/dL (ref 30.0–36.0)
MCV: 90.9 fL (ref 78.0–100.0)
MONOS PCT: 6 %
Monocytes Absolute: 0.4 10*3/uL (ref 0.1–1.0)
NEUTROS ABS: 5.6 10*3/uL (ref 1.7–7.7)
NEUTROS PCT: 86 %
Platelets: 88 10*3/uL — ABNORMAL LOW (ref 150–400)
RBC: 2.53 MIL/uL — ABNORMAL LOW (ref 4.22–5.81)
RDW: 15.8 % — ABNORMAL HIGH (ref 11.5–15.5)
WBC: 6.6 10*3/uL (ref 4.0–10.5)

## 2015-10-18 LAB — COMPREHENSIVE METABOLIC PANEL
ALBUMIN: 1.8 g/dL — AB (ref 3.5–5.0)
ALK PHOS: 45 U/L (ref 38–126)
ALT: 8 U/L — ABNORMAL LOW (ref 17–63)
ALT: 11 U/L — ABNORMAL LOW (ref 17–63)
ANION GAP: 6 (ref 5–15)
ANION GAP: 12 (ref 5–15)
AST: 22 U/L (ref 15–41)
AST: 33 U/L (ref 15–41)
Albumin: 2.3 g/dL — ABNORMAL LOW (ref 3.5–5.0)
Alkaline Phosphatase: 64 U/L (ref 38–126)
BUN: 27 mg/dL — AB (ref 6–20)
BUN: 26 mg/dL — ABNORMAL HIGH (ref 6–20)
CALCIUM: 7.2 mg/dL — AB (ref 8.9–10.3)
CHLORIDE: 101 mmol/L (ref 101–111)
CO2: 25 mmol/L (ref 22–32)
CO2: 23 mmol/L (ref 22–32)
CREATININE: 1.22 mg/dL (ref 0.61–1.24)
Calcium: 7.8 mg/dL — ABNORMAL LOW (ref 8.9–10.3)
Chloride: 101 mmol/L (ref 101–111)
Creatinine, Ser: 1.35 mg/dL — ABNORMAL HIGH (ref 0.61–1.24)
GFR calc Af Amer: 58 mL/min — ABNORMAL LOW (ref >60)
GFR calc non Af Amer: 50 mL/min — ABNORMAL LOW (ref >60)
GFR, EST NON AFRICAN AMERICAN: 57 mL/min — AB (ref >60)
GLUCOSE: 171 mg/dL — AB (ref 65–99)
Glucose, Bld: 61 mg/dL — ABNORMAL LOW (ref 65–99)
POTASSIUM: 3.7 mmol/L (ref 3.5–5.1)
POTASSIUM: 3.9 mmol/L (ref 3.5–5.1)
SODIUM: 132 mmol/L — AB (ref 135–145)
SODIUM: 136 mmol/L (ref 135–145)
Total Bilirubin: 0.6 mg/dL (ref 0.3–1.2)
Total Bilirubin: 0.8 mg/dL (ref 0.3–1.2)
Total Protein: 4 g/dL — ABNORMAL LOW (ref 6.5–8.1)
Total Protein: 5.5 g/dL — ABNORMAL LOW (ref 6.5–8.1)

## 2015-10-18 LAB — CBC
HCT: 30.7 % — ABNORMAL LOW (ref 39.0–52.0)
HEMOGLOBIN: 9.8 g/dL — AB (ref 13.0–17.0)
MCH: 29 pg (ref 26.0–34.0)
MCHC: 31.9 g/dL (ref 30.0–36.0)
MCV: 90.8 fL (ref 78.0–100.0)
PLATELETS: 146 10*3/uL — AB (ref 150–400)
RBC: 3.38 MIL/uL — AB (ref 4.22–5.81)
RDW: 15.6 % — ABNORMAL HIGH (ref 11.5–15.5)
WBC: 11.9 10*3/uL — AB (ref 4.0–10.5)

## 2015-10-18 LAB — TROPONIN I

## 2015-10-18 LAB — LACTIC ACID, PLASMA
Lactic Acid, Venous: 2 mmol/L (ref 0.5–2.0)
Lactic Acid, Venous: 2.7 mmol/L (ref 0.5–2.0)

## 2015-10-18 LAB — CORTISOL-AM, BLOOD: CORTISOL - AM: 15.9 ug/dL (ref 6.7–22.6)

## 2015-10-18 LAB — PROTIME-INR
INR: 1.92 — ABNORMAL HIGH (ref 0.00–1.49)
Prothrombin Time: 21.9 seconds — ABNORMAL HIGH (ref 11.6–15.2)

## 2015-10-18 LAB — CBG MONITORING, ED
GLUCOSE-CAPILLARY: 45 mg/dL — AB (ref 65–99)
GLUCOSE-CAPILLARY: 110 mg/dL — AB (ref 65–99)
Glucose-Capillary: 69 mg/dL (ref 65–99)

## 2015-10-18 LAB — TSH: TSH: 8.566 u[IU]/mL — AB (ref 0.350–4.500)

## 2015-10-18 LAB — APTT: APTT: 51 s — AB (ref 24–37)

## 2015-10-18 LAB — MRSA PCR SCREENING: MRSA BY PCR: NEGATIVE

## 2015-10-18 LAB — PROCALCITONIN: PROCALCITONIN: 1.02 ng/mL

## 2015-10-18 MED ORDER — POLYETHYLENE GLYCOL 3350 17 G PO PACK
17.0000 g | PACK | Freq: Every day | ORAL | Status: AC
  Administered 2015-10-19: 17 g via ORAL
  Filled 2015-10-18: qty 1

## 2015-10-18 MED ORDER — ACETAMINOPHEN 650 MG RE SUPP
650.0000 mg | Freq: Four times a day (QID) | RECTAL | Status: DC | PRN
Start: 2015-10-18 — End: 2015-10-20

## 2015-10-18 MED ORDER — METOPROLOL TARTRATE 25 MG PO TABS
25.0000 mg | ORAL_TABLET | Freq: Two times a day (BID) | ORAL | Status: DC
  Administered 2015-10-18 – 2015-10-20 (×5): 25 mg via ORAL
  Filled 2015-10-18: qty 1
  Filled 2015-10-18: qty 2
  Filled 2015-10-18 (×4): qty 1

## 2015-10-18 MED ORDER — CLOTRIMAZOLE 1 % EX CREA
1.0000 "application " | TOPICAL_CREAM | Freq: Two times a day (BID) | CUTANEOUS | Status: DC
  Administered 2015-10-19: 1 via TOPICAL
  Filled 2015-10-18: qty 15

## 2015-10-18 MED ORDER — SODIUM CHLORIDE 0.9 % IV BOLUS (SEPSIS)
1000.0000 mL | INTRAVENOUS | Status: AC
  Administered 2015-10-18: 1000 mL via INTRAVENOUS

## 2015-10-18 MED ORDER — ACETAMINOPHEN 325 MG PO TABS
650.0000 mg | ORAL_TABLET | Freq: Four times a day (QID) | ORAL | Status: DC | PRN
  Administered 2015-10-18: 650 mg via ORAL
  Filled 2015-10-18: qty 2

## 2015-10-18 MED ORDER — DEXTROSE 50 % IV SOLN
1.0000 | Freq: Once | INTRAVENOUS | Status: AC
  Administered 2015-10-18: 50 mL via INTRAVENOUS
  Filled 2015-10-18: qty 50

## 2015-10-18 MED ORDER — DEXTROSE 50 % IV SOLN
1.0000 | INTRAVENOUS | Status: DC | PRN
  Administered 2015-10-18: 50 mL via INTRAVENOUS
  Filled 2015-10-18: qty 50

## 2015-10-18 MED ORDER — MAGIC MOUTHWASH W/LIDOCAINE
5.0000 mL | Freq: Three times a day (TID) | ORAL | Status: DC | PRN
  Filled 2015-10-18: qty 5

## 2015-10-18 MED ORDER — APIXABAN 5 MG PO TABS
5.0000 mg | ORAL_TABLET | Freq: Two times a day (BID) | ORAL | Status: DC
  Administered 2015-10-18 – 2015-10-20 (×5): 5 mg via ORAL
  Filled 2015-10-18 (×5): qty 1
  Filled 2015-10-18: qty 2

## 2015-10-18 MED ORDER — POLYETHYLENE GLYCOL 3350 17 G PO PACK: 8.5000 g | PACK | Freq: Every day | ORAL | Status: DC | PRN

## 2015-10-18 MED ORDER — FOLIC ACID 1 MG PO TABS: 1.0000 mg | ORAL_TABLET | Freq: Every day | ORAL | Status: DC

## 2015-10-18 MED ORDER — FLUOROMETHOLONE 0.1 % OP SUSP
1.0000 [drp] | Freq: Two times a day (BID) | OPHTHALMIC | Status: DC
  Administered 2015-10-18 – 2015-10-20 (×5): 1 [drp] via OPHTHALMIC
  Filled 2015-10-18: qty 5

## 2015-10-18 MED ORDER — DEXTROSE 5 % IV SOLN
500.0000 mg | INTRAVENOUS | Status: DC
  Administered 2015-10-18 – 2015-10-19 (×2): 500 mg via INTRAVENOUS
  Filled 2015-10-18 (×2): qty 500

## 2015-10-18 MED ORDER — ALBUTEROL SULFATE (2.5 MG/3ML) 0.083% IN NEBU: 3.0000 mL | INHALATION_SOLUTION | Freq: Four times a day (QID) | RESPIRATORY_TRACT | Status: DC | PRN

## 2015-10-18 MED ORDER — VITAMIN B-1 100 MG PO TABS
100.0000 mg | ORAL_TABLET | Freq: Every day | ORAL | Status: DC
  Administered 2015-10-18 – 2015-10-20 (×3): 100 mg via ORAL
  Filled 2015-10-18 (×3): qty 1

## 2015-10-18 MED ORDER — ADULT MULTIVITAMIN W/MINERALS CH
1.0000 | ORAL_TABLET | Freq: Every day | ORAL | Status: DC
  Administered 2015-10-18 – 2015-10-20 (×3): 1 via ORAL
  Filled 2015-10-18 (×3): qty 1

## 2015-10-18 MED ORDER — ONDANSETRON HCL 4 MG PO TABS: 4.0000 mg | ORAL_TABLET | Freq: Four times a day (QID) | ORAL | Status: DC | PRN

## 2015-10-18 MED ORDER — GUAIFENESIN ER 600 MG PO TB12: 600.0000 mg | ORAL_TABLET | Freq: Two times a day (BID) | ORAL | Status: DC | PRN

## 2015-10-18 MED ORDER — OCUVITE PO TABS
1.0000 | ORAL_TABLET | Freq: Two times a day (BID) | ORAL | Status: DC
  Administered 2015-10-18 – 2015-10-19 (×4): 1 via ORAL
  Filled 2015-10-18 (×7): qty 1

## 2015-10-18 MED ORDER — DEXTROSE 50 % IV SOLN
INTRAVENOUS | Status: AC
  Administered 2015-10-18: 25 mL
  Filled 2015-10-18: qty 50

## 2015-10-18 MED ORDER — CEFTRIAXONE SODIUM 1 G IJ SOLR
1.0000 g | INTRAMUSCULAR | Status: DC
  Administered 2015-10-18 – 2015-10-19 (×2): 1 g via INTRAVENOUS
  Filled 2015-10-18 (×2): qty 10

## 2015-10-18 MED ORDER — SODIUM CHLORIDE 0.9 % IV BOLUS (SEPSIS): 1000.0000 mL | Freq: Once | INTRAVENOUS | Status: DC

## 2015-10-18 MED ORDER — SUCRALFATE 1 GM/10ML PO SUSP
1.0000 g | Freq: Three times a day (TID) | ORAL | Status: DC
  Administered 2015-10-18 – 2015-10-20 (×9): 1 g via ORAL
  Filled 2015-10-18 (×13): qty 10

## 2015-10-18 MED ORDER — DEXTROSE 10 % IV SOLN
INTRAVENOUS | Status: DC
Start: 2015-10-18 — End: 2015-10-18
  Administered 2015-10-18: 04:00:00 via INTRAVENOUS
  Filled 2015-10-18: qty 1000

## 2015-10-18 MED ORDER — DEXTROSE 10 % IV SOLN
INTRAVENOUS | Status: DC
  Administered 2015-10-18: 09:00:00 via INTRAVENOUS

## 2015-10-18 MED ORDER — METHYLPREDNISOLONE SODIUM SUCC 125 MG IJ SOLR
50.0000 mg | Freq: Once | INTRAMUSCULAR | Status: AC
  Administered 2015-10-18: 50 mg via INTRAVENOUS
  Filled 2015-10-18: qty 2

## 2015-10-18 MED ORDER — LORATADINE 10 MG PO TABS: 10.0000 mg | ORAL_TABLET | ORAL | Status: DC

## 2015-10-18 MED ORDER — ONDANSETRON HCL 4 MG/2ML IJ SOLN: 4.0000 mg | Freq: Four times a day (QID) | INTRAMUSCULAR | Status: DC | PRN

## 2015-10-18 MED ORDER — INSULIN ASPART 100 UNIT/ML ~~LOC~~ SOLN: 0.0000 [IU] | SUBCUTANEOUS | Status: DC

## 2015-10-18 MED ORDER — DEXTROSE 5 % IV SOLN
INTRAVENOUS | Status: AC
  Administered 2015-10-18 (×2): via INTRAVENOUS

## 2015-10-18 MED ORDER — DEXTROSE-NACL 5-0.9 % IV SOLN
INTRAVENOUS | Status: DC
  Administered 2015-10-18: 06:00:00 via INTRAVENOUS

## 2015-10-18 MED ORDER — FOLIC ACID 1 MG PO TABS
1.0000 mg | ORAL_TABLET | Freq: Every day | ORAL | Status: DC
  Administered 2015-10-18 – 2015-10-20 (×3): 1 mg via ORAL
  Filled 2015-10-18 (×3): qty 1

## 2015-10-18 MED ORDER — TRAMADOL HCL 50 MG PO TABS
50.0000 mg | ORAL_TABLET | Freq: Every day | ORAL | Status: DC
  Administered 2015-10-18 – 2015-10-19 (×2): 50 mg via ORAL
  Filled 2015-10-18 (×2): qty 1

## 2015-10-18 MED ORDER — SODIUM CHLORIDE 0.9 % IV BOLUS (SEPSIS)
500.0000 mL | Freq: Once | INTRAVENOUS | Status: AC
  Administered 2015-10-18: 500 mL via INTRAVENOUS

## 2015-10-18 MED ORDER — SODIUM CHLORIDE 0.9 % IJ SOLN
3.0000 mL | Freq: Two times a day (BID) | INTRAMUSCULAR | Status: DC
  Administered 2015-10-18 (×2): 3 mL via INTRAVENOUS

## 2015-10-18 NOTE — ED Provider Notes (Signed)
12:50 AM Patient also seen and examined by me. Patient presented to emergency department for hypoglycemia. CBG initially was 34. D50 given on the side. Patient has had cold symptoms for several days, placed on Levaquin yesterday. Has had 2 doses so far. He denies any fever or chills, denies any worsening symptoms. Patient does admit to neck eating as much since his last chemotherapy which was 5 days ago.  Patient continues to take his medications including Amaryl despite not eating much.    patient's blood work showed slightly worsening creatinine consistent with dehydration. He is afebrile. Nontoxic appearing. Patient's blood sugar is not improving despite 2 amps of D50, and D5  Infusion. Will switch to D10. White blood cell is normal at this time. He is afebrile. Doubt sepsis.He is mentating well. Eating sandwitch and drinking orange juice.  Pt already received levaquin dose this morning. Will leave up to the hospitalist whether to give more antibiotics at this time. Will admit.   Spoke with hospitalist who will admit pt.   Filed Vitals:   10/17/15 1951 10/17/15 1953 10/17/15 2220 10/17/15 2315  BP: 93/69 93/69 118/68 99/65  Pulse: 85 86 87 86  Temp: 97.3 F (36.3 C) 97.2 F (36.2 C)    TempSrc: Oral Oral    Resp: '22 20 20 21  '$ SpO2: 99% 100% 96% 100%     Jeannett Senior, PA-C 10/18/15 0106  Orpah Greek, MD 10/20/15 [phone]

## 2015-10-18 NOTE — H&P (Signed)
[facility] History and Physical  Shawn Espinoza EVO:[id number] DOB: 04-Oct-1941 DOA: 10/17/2015  Referring physician: Alisia Ferrari, PA PCP: [name], MD   Chief Complaint: Pneumonia  HPI: Shawn Espinoza is a 74 y.o. male with history of DM Stage 4 Lung cancer HTN HLD Hep C CHF GERD presents with hypoglycemia. He sates that he has been having increased cough and sputum production since last Friday. He was levaquin from the clinic and had taken two doses so far. He came into the ER because his blood glucose became low and he had seizure like activity. He is now awake and alert. He also states that he was dehydrated. He has had fever to 100.7. Patient states that he has been having chills also. He was supposed to have chemo today but since he felt sick so his wife called EMS. In the ED he had a CXR showing infiltrates c/w pneumonia. Patient has COPD and is on chronic oxygen at home. Patient also states his DM is usually under good control.   Review of Systems:  Complete 12 point ROS performed and is unremarkable.   Past Medical History  Diagnosis Date  . History of diabetes mellitus, type II     resolved with diet, h/o neuropathy  . Diverticulosis of colon   . Hyperlipidemia   . Hypertension   . Fatty liver 02/29/00    abd ultrasound  . Lower back pain   . Systolic murmur 2993    2Decho - normal LV fxn, EF 55%, mild AS, biatrial enlargement  . History of tobacco use quit 1990s  . 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 Personal history of colonic adenomas 07/06/2013  . Hypertensive retinopathy of both eyes 2015    mild  . Dysrhythmia     Atrial fib (found 07/2014)  . Shortness of breath   . Pneumonia   . GERD (gastroesophageal reflux disease)     occasional  . Arthritis   . Neuropathy ([place])   . Constipation   . 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 Diabetes mellitus without complication (HCC)     Type 2  . Depression   . 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 Full code status 09/26/2015   Past Surgical History  Procedure Laterality Date  . Colonoscopy  2004  . Colonoscopy  2014    tubular adenoma x1, mod diverticulosis ([place])  . Flexible bronchoscopy  02/2014    WNL  . Video bronchoscopy Bilateral 03/17/2014    Procedure: VIDEO BRONCHOSCOPY WITH FLUORO;  Surgeon: Tanda Rockers, MD;  Location: [facility];  Service: Cardiopulmonary;  Laterality: Bilateral;  . Chest tube insertion Left 08/26/2014    Procedure: INSERTION OF LEFT  PLEURAL DRAINAGE CATHETER;  Surgeon: Ivin Poot, MD;  Location: [place];  Service: Thoracic;  Laterality: Left;  . Cataract extraction Right 03/2015    Dr [name]  . Portacath placement Right 03/31/2015    Procedure: INSERTION PORT-A-CATH;  Surgeon: Ivin Poot, MD;  Location: [place];  Service: Thoracic;  Laterality: Right;  . Removal of pleural drainage catheter Left 03/31/2015    Procedure: REMOVAL OF PLEURAL DRAINAGE CATHETER;  Surgeon: Ivin Poot, MD;  Location: [place];  Service: Thoracic;  Laterality: Left;  Social History:  reports that he quit smoking about 26 years ago. His smoking use included Cigarettes. He has a 90 pack-year smoking history. He has never used smokeless tobacco. He reports that he drinks about 12.6 oz of alcohol per week. He reports that he does not use illicit drugs.  Allergies  Allergen Reactions  . Candesartan Cilexetil     REACTION: Muscle spasms  . Diltiazem Hcl     REACTION: Dizziness  . Doxazosin Mesylate Other (See Comments)    REACTION: H/A's  . Nifedipine     REACTION: Leg swelling  . Pravastatin Other (See Comments)    Muscle aches  . Red Yeast Rice     Muscle aches  . Rosuvastatin Other (See Comments)    REACTION: questionable: severe constipation  . Sertraline Other (See  Comments)    oversedation  . Simvastatin Other (See Comments)    REACTION: Muscle aches  . Hydrocodone Other (See Comments)    constipation    Family History  Problem Relation Age of Onset  . Stroke Mother     multiple mini strokes  . Hypertension Mother   . Heart disease Maternal Grandfather     MI  . Stroke Paternal Grandmother   . Colon cancer Neg Hx      Prior to Admission medications   Medication Sig Start Date End Date Taking? Authorizing Provider  albuterol (PROVENTIL HFA;VENTOLIN HFA) 108 (90 BASE) MCG/ACT inhaler Inhale 2 puffs into the lungs every 6 (six) hours as needed for wheezing or shortness of breath. 09/08/15  Yes Carlton Adam, PA-C  Alum & Mag Hydroxide-Simeth (MAGIC MOUTHWASH W/LIDOCAINE) SOLN Take 5 mLs by mouth 3 (three) times daily as needed for mouth pain. 03/03/15  Yes Curt Bears, MD  clotrimazole (LOTRIMIN) 1 % cream Apply 1 application topically 2 (two) times daily. 09/08/15  Yes [name], MD  dexamethasone (DECADRON) 4 MG tablet 2 tab po bid, the day before, day of and day after chemotherapy every 3 weeks 07/11/15  Yes Curt Bears, MD  ELIQUIS 5 MG TABS tablet TAKE 1 TABLET (5 MG TOTAL) BY MOUTH 2 (TWO) TIMES DAILY. 10/04/15  Yes [name], MD  fluorometholone (FML) 0.1 % ophthalmic suspension Place 1 drop into both eyes 2 (two) times daily. 10/11/15  Yes Historical Provider, MD  folic acid (FOLVITE) 1 MG tablet TAKE 1 TABLET BY MOUTH EVERY DAY 07/25/15  Yes Curt Bears, MD  glimepiride (AMARYL) 1 MG tablet Take 1 mg by mouth daily with breakfast.   Yes Historical Provider, MD  guaiFENesin (MUCINEX) 600 MG 12 hr tablet Take 600 mg by mouth 2 (two) times daily as needed for cough or to loosen phlegm.    Yes Historical Provider, MD  KLOR-CON M10 10 MEQ tablet Take 20 mEq by mouth daily as needed (with torsemide).  05/02/15  Yes Historical Provider, MD  levofloxacin (LEVAQUIN) 500 MG tablet Take 1 tablet (500 mg total) by mouth daily.  10/16/15  Yes Susanne Borders, NP  lidocaine-prilocaine (EMLA) cream Apply 1 application topically as needed. 04/04/15  Yes Adrena E Johnson, PA-C  loratadine (CLARITIN) 10 MG tablet Take 10 mg by mouth as directed. 3 days before chemo and 4 days after   Yes Historical Provider, MD  metoprolol tartrate (LOPRESSOR) 25 MG tablet TAKE 1 TABLET TWICE A DAY 09/04/15  Yes [name], MD  Multiple Vitamins-Minerals (ICAPS) CAPS Take 1 capsule by mouth 2 (two) times daily.    Yes Historical Provider, MD  polyethylene  glycol (MIRALAX / GLYCOLAX) packet Take 8.5 g by mouth daily as needed for moderate constipation.    Yes Historical Provider, MD  sucralfate (CARAFATE) 1 GM/10ML suspension Take 10 mLs (1 g total) by mouth 4 (four) times daily -  with meals and at bedtime. 10/16/15  Yes Susanne Borders, NP  torsemide (DEMADEX) 20 MG tablet Take 20 mg by mouth 2 (two) times daily. Takes one tablet daily and if swelling continues he takes a second pill 08/18/15  Yes [name], MD  traMADol (ULTRAM) 50 MG tablet Take 1 tablet (50 mg total) by mouth every 6 (six) hours as needed. Patient taking differently: Take 50 mg by mouth at bedtime.  10/16/15  Yes Susanne Borders, NP  diphenoxylate-atropine (LOMOTIL) 2.5-0.025 MG per tablet Take 2 tablets by mouth 4 (four) times daily as needed for diarrhea or loose stools. Patient not taking: Reported on 10/16/2015 01/17/15   Susanne Borders, NP  hydrocortisone (ANUSOL-HC) 25 MG suppository Place 1 suppository (25 mg total) rectally 2 (two) times daily. Patient not taking: Reported on 10/16/2015 07/26/15   Jearld Fenton, NP   Physical Exam: Filed Vitals:   10/17/15 1951 10/17/15 1953 10/17/15 2220 10/17/15 2315  BP: 93/69 93/69 118/68 99/65  Pulse: 85 86 87 86  Temp: 97.3 F (36.3 C) 97.2 F (36.2 C)    TempSrc: Oral Oral    Resp: '22 20 20 21  '$ SpO2: 99% 100% 96% 100%    Wt Readings from Last 3 Encounters:  09/26/15 85.095 kg (187 lb 9.6 oz)  09/05/15  82.509 kg (181 lb 14.4 oz)  08/22/15 78.926 kg (174 lb)    General:  Appears calm and comfortable Eyes: PERRL, normal lids, irises & conjunctiva ENT: grossly normal hearing, lips & tongue Neck: no LAD, masses or thyromegaly Cardiovascular: RRR, no m/r/g. +LE edema. Respiratory: few scattered ronchi. Normal respiratory effort. Abdomen: soft, ntnd Skin: chronic skin changes in LE Musculoskeletal: grossly normal tone BUE/BLE Psychiatric: grossly normal mood and affect Neurologic: grossly non-focal.          Labs on Admission:  Basic Metabolic Panel:  Recent Labs Lab 10/16/15 1050 10/17/15 2152 10/17/15 2356  NA 135* 137 136  K 4.3 3.6 3.8  CL  --  102 101  CO2 25 27  --   GLUCOSE 119 71 49*  BUN 19.9 27* 24*  CREATININE 1.2 1.41* 1.40*  CALCIUM 8.8 7.7*  --    Liver Function Tests:  Recent Labs Lab 10/16/15 1050 10/17/15 2152  AST 11 24  ALT <9 8*  ALKPHOS 72 59  BILITOT 0.90 0.6  PROT 5.6* 4.9*  ALBUMIN 2.3* 2.2*   No results for input(s): LIPASE, AMYLASE in the last 168 hours. No results for input(s): AMMONIA in the last 168 hours. CBC:  Recent Labs Lab 10/16/15 1050 10/17/15 2152 10/17/15 2356  WBC 18.1* 9.4  --   NEUTROABS 15.9* 8.5*  --   HGB 10.5* 8.8* 9.5*  HCT 33.0* 27.6* 28.0*  MCV 89.8 92.0  --   PLT 212 115*  --    Cardiac Enzymes: No results for input(s): CKTOTAL, CKMB, CKMBINDEX, TROPONINI in the last 168 hours.  BNP (last 3 results) No results for input(s): BNP in the last 8760 hours.  ProBNP (last 3 results)  Recent Labs  08/18/15 1245  PROBNP 222.0*    CBG:  Recent Labs Lab 10/17/15 1946 10/17/15 2216 10/17/15 2218 10/17/15 2339  GLUCAP 62* 46* 39* 47*  Radiological Exams on Admission: Dg Chest 2 View  10/18/2015  CLINICAL DATA:  Acute onset of cough and fever.  Initial encounter. EXAM: CHEST  2 VIEW COMPARISON:  Chest radiograph performed 07/31/2015, and CT of the chest performed 09/22/2015 FINDINGS: The lungs  are well-aerated. A small left pleural effusion is noted. Mild left perihilar and left basilar airspace opacities may reflect pneumonia, given the patient's symptoms. The right lung appears relatively clear. No pneumothorax is seen. The heart is borderline normal in size. A right-sided chest port is noted ending about the distal SVC. No acute osseous abnormalities are seen. IMPRESSION: Small left pleural effusion noted. Mild left perihilar and left basilar airspace opacities raise concern for pneumonia, given the patient's symptoms. Electronically Signed   By: Garald Balding M.D.   On: 10/18/2015 00:10      Assessment/Plan Active Problems:   HLD (hyperlipidemia)   Essential hypertension   Adenocarcinoma of left lung, stage 4 (HCC)   CHF (congestive heart failure) (HCC)   Hypoglycemia   AKI (acute kidney injury) ([place])   1. Lobar Pneumonia -will get blood cultures -ordered sputum cultures -check strep antigen and legionella ag -will start on rocephin and azithromycin  2. Hypoglycemia -unclear etiology ?early sepsis due to pneumonia -will place on D5NS -monitor FSBS  3. CHF -holding torsemide for now -will monitor fluid status  4. Stage 4 adenocarcinoma of the lung -being followed by Oncology -Last seen 10/4 and per notes appears that there was some decrease in his nodules size  5. AKI -will hydrate -hold torsemide for now  6. HTN -continue with antihypertensives -monitor pressures  7. HLD -will continue with   8. COPD -will continue with albuterol inhalers  9. DM II with hypoglycemia -will hold amaryl for now -place on FSBS and SSI as needed -continue with D10 and adjust based on FSBS     Code Status: full code (must indicate code status--if unknown or must be presumed, indicate so) DVT Prophylaxis:on eliquis Family Communication: none (indicate person spoken with, if applicable, with phone number if by telephone) Disposition Plan: home (indicate anticipated  LOS)    [facility] Pager [phone]

## 2015-10-18 NOTE — Progress Notes (Addendum)
Hypoglycemic Event  0743 CBG: 37/39; 0813 CBG: 43; 0837 CBG: 43; 0857 CBG: 117  Treatment: 15 GM carbohydrate snack (two containers of orange juice and breakfast: english muffin with sausage, also a chocolate ensure was given) After 0837 Pt was given 25 cc of Dextrose 50%.  Symptoms: Sweaty and Shaky  Follow-up CBG: [id number] CBG Result: 117  Possible Reasons for Event & Comments/MD notified: MD aware of gliprimide medication taken yesterday as well as decrease in PO intake. MD changed IV fluids to better suit pt and is continuing to follow up. Will continue to monitor Blood sugar closely.    Lynnell Dike

## 2015-10-18 NOTE — ED Notes (Signed)
Dr. Althea Grimmer is aware of patients blood sugar has dropped again to '69mg'$ /dL and  Additional dextrose was going to be given.

## 2015-10-18 NOTE — ED Notes (Signed)
Patient ambulated to restroom with standby assistance and back to bed. Pt was short of breath when returning.

## 2015-10-18 NOTE — Care Management Note (Signed)
Case Management Note  Patient Details  Name: Shawn Espinoza MRN: [id number] Date of Birth: [birth year]  Subjective/Objective:        Hypoglycemia in the presence of pna and aki            Action/Plan:Date: October 18, 2015 Chart reviewed for concurrent status and case management needs. Will continue to follow patient for changes and needs: Velva Harman, RN, BSN, [place]   [phone]  Expected Discharge Date:                  Expected Discharge Plan:  Home/Self Care  In-House Referral:  NA  Discharge planning Services  CM Consult  Post Acute Care Choice:  NA Choice offered to:  NA  DME Arranged:    DME Agency:     HH Arranged:    HH Agency:     Status of Service:  In process, will continue to follow  Medicare Important Message Given:    Date Medicare IM Given:    Medicare IM give by:    Date Additional Medicare IM Given:    Additional Medicare Important Message give by:     If discussed at [place] of Stay Meetings, dates discussed:    Additional Comments:  Leeroy Cha, RN 10/18/2015, 9:53 AM

## 2015-10-18 NOTE — Progress Notes (Signed)
CRITICAL VALUE ALERT  Critical value received:  Lactic acid 2.7   Date of notification:  10/18/2015  Time of notification:  0940  Critical value read back:Yes.    Nurse who received alert:  Javier Glazier  MD notified (1st page):  [place]  Time of first page:  [phone]  MD notified (2nd page):  Time of second page:  Responding MD: Aileen Fass  Time MD responded:  [phone]

## 2015-10-18 NOTE — Consult Note (Signed)
WOC wound consult note Reason for Consult: Full thickness wound in the perianal area, Stage 3 Wound type:Full thickness, etiology moisture plus pressure plus diminished circulation caused by sitting on foam "donut" Pressure Ulcer POA: Yes Measurement:1.5cm x 0.5cm x 0.4cm with undermining at 12 o'clock measuring 0.4cm Wound CHE:KBTCY, pink, pale, moist Drainage (amount, consistency, odor) scant serous Periwound:intact, erythematous, no induration or warmth Dressing procedure/placement/frequency: I will provide patient with a pressure relieving chair cushion; he is taught about foam rubber ring donut use and why it is not recommended.  Care (topica) will entail cleansing and filling of defect/protection of periwound tissue with a clear zinc oxide preparation, Critic Aid Clear, available OTC and within Nursing domain.orders are provided for Nursing. [place] nursing team will not follow, but will remain available to this patient, the nursing and medical teams.  Please re-consult if needed. Thanks, Maudie Flakes, MSN, RN, [place], [place], [place] [phone])

## 2015-10-18 NOTE — Progress Notes (Addendum)
[facility] PROGRESS NOTE    Progress Note   Shawn Espinoza:[id number] DOB: [birth year] DOA: 10/17/2015 PCP: [name], MD   Brief Narrative:   Shawn Espinoza is an 74 y.o. male  With a history of diabetes mellitus and stage IV lung cancer, hypertension and hep C who presents to the hospital with hypoglycemia, with a diagnosis of community-acquired pneumonia.  Assessment/Plan:   Sepsis due to Lobar pneumonia ([place]): - Started empirically on community-acquired antibiotic regimen. - Legionella pending, blood cultures and sputum cultures are pending. - He has remained afebrile since admission. And his leukocytosis resolved. - Lactic acid improved after IV hydration.  Severe Hypoglycemia - Multifactorial due to infectious etiology including Glimepiride, which has a half-life of greater than 24 hours. - Cortisol level was 15, Change IV fluids to D10 Continue CBGs Checked. - Hold Glimepiride and all insulins. Continue hypoglycemia protocol.  Hyponatremia: - Likely due to prerenal azotemia, continue IV fluid hydration recheck a basic metabolic panel in the morning.  Essential hypertension - Continue to hold antihypertensive medication.  Adenocarcinoma of left lung, stage 4 ([place]) - Will defer chemotherapy cycle due to his infectious etiology.  AKI (acute kidney injury) ([place]) - Likely prerenal due to hypovolemia continue IV fluid hydration B check a basic metabolic panel in the morning. - we'll have to be judicious about IV fluids as he has moderate aortic stenosis, monitor strict I's and O's.  Aortic stenosis, moderate  Chemotherapy-induced anemia; - Mild drop in hemoglobin, likely hemodilution, he denies any bright red blood per rectum or melanotic stools. - We'll continue check CBCs and transfuse if hemoglobin less than 7 or symptomatic. - Up out of bed and walking    DVT Prophylaxis - Lovenox ordered.  Family Communication: none Disposition Plan:  Home when stable. Code Status: Transfer to [place] floor    Code Status Orders        Start     Ordered   10/18/15 0527  Full code   Continuous     10/18/15 0527    Advance Directive Documentation        Espinoza Recent Value   Type of Advance Directive  Healthcare Power of Attorney, Living will   Pre-existing out of facility DNR order (yellow form or pink Espinoza form)     "Espinoza" Form in Place?          IV Access:    Peripheral IV   Procedures and diagnostic studies:   Dg Chest 2 View  10/18/2015  CLINICAL DATA:  Acute onset of cough and fever.  Initial encounter. EXAM: CHEST  2 VIEW COMPARISON:  Chest radiograph performed 07/31/2015, and CT of the chest performed 09/22/2015 FINDINGS: The lungs are well-aerated. A small left pleural effusion is noted. Mild left perihilar and left basilar airspace opacities may reflect pneumonia, given the patient's symptoms. The right lung appears relatively clear. No pneumothorax is seen. The heart is borderline normal in size. A right-sided chest port is noted ending about the distal SVC. No acute osseous abnormalities are seen. IMPRESSION: Small left pleural effusion noted. Mild left perihilar and left basilar airspace opacities raise concern for pneumonia, given the patient's symptoms. Electronically Signed   By: Garald Balding M.D.   On: 10/18/2015 00:10     Medical Consultants:    None.  Anti-Infectives:   Anti-infectives    Start     Dose/Rate Route Frequency Ordered Stop   10/18/15 0800  azithromycin (ZITHROMAX) 500 mg in dextrose 5 %  250 mL IVPB     500 mg 250 mL/hr over 60 Minutes Intravenous Every 24 hours 10/18/15 0527 10/25/15 0759   10/18/15 0600  cefTRIAXone (ROCEPHIN) 1 g in dextrose 5 % 50 mL IVPB     1 g 100 mL/hr over 30 Minutes Intravenous Every 24 hours 10/18/15 0527 10/25/15 0559      Subjective:    Shawn Espinoza he relates no appetite, slightly improved compared to yesterday.  Still weak  though.  Objective:    Filed Vitals:   10/18/15 0430 10/18/15 0531 10/18/15 0631 10/18/15 0800  BP: 99/60 138/71 136/77   Pulse: 83 95 95   Temp:  97.4 F (36.3 C)  97.6 F (36.4 C)  TempSrc:  Rectal  Oral  Resp: '22 22 21   '$ Height:  '5\' 9"'$  (1.753 m)    Weight:  84.9 kg (187 lb 2.7 oz)    SpO2: 100% 99% 97%     Intake/Output Summary (Last 24 hours) at 10/18/15 0840 Last data filed at 10/18/15 0730  Gross per 24 hour  Intake 1143.33 ml  Output      0 ml  Net 1143.33 ml   Filed Weights   10/18/15 0531  Weight: 84.9 kg (187 lb 2.7 oz)    Exam: Gen:  NAD Cardiovascular:  RRR, No M/R/G Chest and lungs:   CTAB Abdomen:  Abdomen soft, NT/ND, + BS Extremities:  No C/E/C   Data Reviewed:    Labs: Basic Metabolic Panel:  Recent Labs Lab 10/16/15 1050  10/17/15 2152 10/17/15 2356 10/18/15 0352  NA 135*  --  137 136 132*  K 4.3  < > 3.6 3.8 3.7  CL  --   --  102 101 101  CO2 25  --  27  --  25  GLUCOSE 119  --  71 49* 171*  BUN 19.9  --  27* 24* 27*  CREATININE 1.2  --  1.41* 1.40* 1.35*  CALCIUM 8.8  --  7.7*  --  7.2*  < > = values in this interval not displayed. GFR Estimated Creatinine Clearance: 51.9 mL/min (by C-G formula based on Cr of 1.35). Liver Function Tests:  Recent Labs Lab 10/16/15 1050 10/17/15 2152 10/18/15 0352  AST '11 24 22  '$ ALT <9 8* 8*  ALKPHOS 72 59 45  BILITOT 0.90 0.6 0.6  PROT 5.6* 4.9* 4.0*  ALBUMIN 2.3* 2.2* 1.8*   No results for input(s): LIPASE, AMYLASE in the last 168 hours. No results for input(s): AMMONIA in the last 168 hours. Coagulation profile  Recent Labs Lab 10/18/15 0352  INR 1.92*    CBC:  Recent Labs Lab 10/16/15 1050 10/17/15 2152 10/17/15 2356 10/18/15 0352  WBC 18.1* 9.4  --  6.6  NEUTROABS 15.9* 8.5*  --  5.6  HGB 10.5* 8.8* 9.5* 7.3*  HCT 33.0* 27.6* 28.0* 23.0*  MCV 89.8 92.0  --  90.9  PLT 212 115*  --  88*   Cardiac Enzymes: No results for input(s): CKTOTAL, CKMB, CKMBINDEX,  TROPONINI in the last 168 hours. BNP (last 3 results)  Recent Labs  08/18/15 1245  PROBNP 222.0*   CBG:  Recent Labs Lab 10/18/15 0401 10/18/15 0445 10/18/15 0530 10/18/15 0743 10/18/15 0744  GLUCAP 110* 69 71 37* 39*   D-Dimer: No results for input(s): DDIMER in the last 72 hours. Hgb A1c: No results for input(s): HGBA1C in the last 72 hours. Lipid Profile: No results for input(s): CHOL, HDL, LDLCALC, TRIG, CHOLHDL,  LDLDIRECT in the last 72 hours. Thyroid function studies: No results for input(s): TSH, T4TOTAL, T3FREE, THYROIDAB in the last 72 hours.  Invalid input(s): FREET3 Anemia work up: No results for input(s): VITAMINB12, FOLATE, FERRITIN, TIBC, IRON, RETICCTPCT in the last 72 hours. Sepsis Labs:  Recent Labs Lab 10/16/15 1050 10/17/15 2138 10/17/15 2152 10/18/15 0352  PROCALCITON  --   --   --  1.02  WBC 18.1*  --  9.4 6.6  LATICACIDVEN  --  2.11*  --  2.0   Microbiology Recent Results (from the past 240 hour(s))  Urine Culture     Status: None   Collection Time: 10/16/15 10:51 AM  Result Value Ref Range Status   Urine Culture, Routine Culture, Urine  Final    Comment: Final - ===== COLONY COUNT: ===== NO GROWTH NO GROWTH   Culture, blood (x 2)     Status: None (Preliminary result)   Collection Time: 10/18/15  3:50 AM  Result Value Ref Range Status   Specimen Description   Final    BLOOD LEFT ANTECUBITAL Performed at [facility]    Special Requests BOTTLES DRAWN AEROBIC AND ANAEROBIC 5CC  Final   Culture PENDING  Incomplete   Report Status PENDING  Incomplete  Culture, blood (x 2)     Status: None (Preliminary result)   Collection Time: 10/18/15  3:54 AM  Result Value Ref Range Status   Specimen Description   Final    BLOOD LEFT HAND Performed at [facility]    Special Requests BOTTLES DRAWN AEROBIC AND ANAEROBIC 5CC  Final   Culture PENDING  Incomplete   Report Status PENDING  Incomplete  MRSA PCR Screening     Status:  None   Collection Time: 10/18/15  6:10 AM  Result Value Ref Range Status   MRSA by PCR NEGATIVE NEGATIVE Final    Comment:        The GeneXpert MRSA Assay (FDA approved for NASAL specimens only), is one component of a comprehensive MRSA colonization surveillance program. It is not intended to diagnose MRSA infection nor to guide or monitor treatment for MRSA infections.      Medications:   . apixaban  5 mg Oral BID  . azithromycin  500 mg Intravenous Q24H  . beta carotene w/minerals  1 tablet Oral BID  . cefTRIAXone (ROCEPHIN)  IV  1 g Intravenous Q24H  . clotrimazole  1 application Topical BID  . fluorometholone  1 drop Both Eyes BID  . folic acid  1 mg Oral Daily  . insulin aspart  0-9 Units Subcutaneous 6 times per day  . metoprolol tartrate  25 mg Oral BID  . multivitamin with minerals  1 tablet Oral Daily  . sodium chloride  3 mL Intravenous Q12H  . sucralfate  1 g Oral TID WC & HS  . thiamine  100 mg Oral Daily  . traMADol  50 mg Oral QHS   Continuous Infusions: . dextrose      Time spent: 25 min   LOS: 0 days   Charlynne Cousins  [facility] Pager [phone]  *Please refer to [email or web address], password [email or web address] to get updated schedule on who will round on this patient, as hospitalists switch teams weekly. If 7PM-7AM, please contact night-coverage at [email or web address], password [email or web address] for any overnight needs.  10/18/2015, 8:40 AM

## 2015-10-18 NOTE — ED Notes (Addendum)
Since taking blood sugar at shift change, pt has been given and encouraged to drink orange juice. Pt is alert and oriented. Pt reports he does not feel like his blood sugar is low. Skin is warm and dry. Informed patient of the plan of care. Notified Dr. Althea Grimmer of patients condition. New orders obtained.

## 2015-10-18 NOTE — ED Notes (Signed)
Cecille Rubin, RN (A/C) was acquiring about if patient was appropriate for telemetry bed assignment. Spoke with Dr. Althea Grimmer about patients condition and inquiring about a step down ICU bed assignment. Dr. Althea Grimmer feels patient may have adrenal insufficiency and has ordered for Solu-Medrol and cortisone-am blood test. Furthermore, pt needs more intense and bedside manner than telemetry. Pt is being assisgned to step down ICU by Dr. Althea Grimmer. Informed Lori, RN of change in bed assignment.

## 2015-10-18 NOTE — Progress Notes (Signed)
Pharmacy: Rx may adjust abx for renal function  Patient's a 74 y.o M with stage 4 lung cancer currently undergoing chemotherapy treatment. He presented to the ED on 10/25 with c/o cough and hypoglycemia.  CXR is suspicious for PNA.  Ceftriaxone and azithromycin started in the ED for suspected PNA.   Plan: - continue azithromycin 500 mg IV q24h and 1gm IV q24h - pharmacy will sign off since no renal adjustment is needed for the two current abx - reconsult [place] if need further assistance  Thank you for asking pharmacy to participate in this patient's care.  Dia Sitter, PharmD, BCPS 10/18/2015 10:32 AM

## 2015-10-19 ENCOUNTER — Ambulatory Visit: Payer: Medicare Other

## 2015-10-19 ENCOUNTER — Telehealth: Payer: Self-pay | Admitting: *Deleted

## 2015-10-19 DIAGNOSIS — A419 Sepsis, unspecified organism: Principal | ICD-10-CM

## 2015-10-19 DIAGNOSIS — T451X5A Adverse effect of antineoplastic and immunosuppressive drugs, initial encounter: Secondary | ICD-10-CM

## 2015-10-19 DIAGNOSIS — D6481 Anemia due to antineoplastic chemotherapy: Secondary | ICD-10-CM

## 2015-10-19 DIAGNOSIS — I10 Essential (primary) hypertension: Secondary | ICD-10-CM

## 2015-10-19 LAB — HEMOGLOBIN A1C
Hgb A1c MFr Bld: 4.9 % (ref 4.8–5.6)
Mean Plasma Glucose: 94 mg/dL

## 2015-10-19 LAB — GLUCOSE, CAPILLARY
GLUCOSE-CAPILLARY: 197 mg/dL — AB (ref 65–99)
GLUCOSE-CAPILLARY: 297 mg/dL — AB (ref 65–99)
GLUCOSE-CAPILLARY: 37 mg/dL — AB (ref 65–99)
GLUCOSE-CAPILLARY: 372 mg/dL — AB (ref 65–99)
Glucose-Capillary: 39 mg/dL — CL (ref 65–99)
Glucose-Capillary: 308 mg/dL — ABNORMAL HIGH (ref 65–99)
Glucose-Capillary: 250 mg/dL — ABNORMAL HIGH (ref 65–99)
Glucose-Capillary: 330 mg/dL — ABNORMAL HIGH (ref 65–99)

## 2015-10-19 LAB — STREP PNEUMONIAE URINARY ANTIGEN: Strep Pneumo Urinary Antigen: NEGATIVE

## 2015-10-19 LAB — EXPECTORATED SPUTUM ASSESSMENT W REFEX TO RESP CULTURE

## 2015-10-19 LAB — EXPECTORATED SPUTUM ASSESSMENT W GRAM STAIN, RFLX TO RESP C

## 2015-10-19 MED ORDER — INFLUENZA VAC SPLIT QUAD 0.5 ML IM SUSY
0.5000 mL | PREFILLED_SYRINGE | INTRAMUSCULAR | Status: DC
  Filled 2015-10-19: qty 0.5

## 2015-10-19 MED ORDER — LEVOFLOXACIN 500 MG PO TABS
500.0000 mg | ORAL_TABLET | ORAL | Status: DC
  Administered 2015-10-19: 500 mg via ORAL
  Filled 2015-10-19 (×2): qty 1

## 2015-10-19 MED ORDER — INSULIN ASPART 100 UNIT/ML ~~LOC~~ SOLN: 0.0000 [IU] | Freq: Every day | SUBCUTANEOUS | Status: DC

## 2015-10-19 MED ORDER — INSULIN ASPART 100 UNIT/ML ~~LOC~~ SOLN
0.0000 [IU] | Freq: Three times a day (TID) | SUBCUTANEOUS | Status: DC
  Administered 2015-10-19: 11 [IU] via SUBCUTANEOUS

## 2015-10-19 MED ORDER — INSULIN DETEMIR 100 UNIT/ML ~~LOC~~ SOLN
5.0000 [IU] | Freq: Two times a day (BID) | SUBCUTANEOUS | Status: DC
  Filled 2015-10-19: qty 0.05

## 2015-10-19 MED ORDER — DIPHENHYDRAMINE HCL 25 MG PO CAPS
25.0000 mg | ORAL_CAPSULE | Freq: Once | ORAL | Status: AC
  Administered 2015-10-19: 25 mg via ORAL
  Filled 2015-10-19: qty 1

## 2015-10-19 MED ORDER — INSULIN DETEMIR 100 UNIT/ML ~~LOC~~ SOLN
5.0000 [IU] | Freq: Every day | SUBCUTANEOUS | Status: DC
  Administered 2015-10-19 – 2015-10-20 (×2): 5 [IU] via SUBCUTANEOUS
  Filled 2015-10-19 (×2): qty 0.05

## 2015-10-19 MED ORDER — INSULIN ASPART 100 UNIT/ML ~~LOC~~ SOLN
3.0000 [IU] | Freq: Three times a day (TID) | SUBCUTANEOUS | Status: DC
  Administered 2015-10-19 – 2015-10-20 (×2): 3 [IU] via SUBCUTANEOUS

## 2015-10-19 NOTE — Progress Notes (Signed)
Inpatient Diabetes Program Recommendations  AACE/ADA: New Consensus Statement on Inpatient Glycemic Control (2015)  Target Ranges:  Prepandial:   less than 140 mg/dL      Peak postprandial:   less than 180 mg/dL (1-2 hours)      Critically ill patients:  140 - 180 mg/dL   Review of Glycemic Control  Results for Shawn Espinoza, Shawn Espinoza (MRN [id number]) as of 10/19/2015 09:23  Ref. Range 10/18/2015 19:31 10/19/2015 00:34 10/19/2015 05:23 10/19/2015 07:34  Glucose-Capillary Latest Ref Range: 65-99 mg/dL 235 (H) 297 (H) 308 (H) 250 (H)    Inpatient Diabetes Program Recommendations:     Consider addition of Novolog sensitive tidwc. HgbA1C likely not accurate with low H/H  Will continue to follow. Thank you. Lorenda Peck, RD, LDN, CDE Inpatient Diabetes Coordinator [phone]

## 2015-10-19 NOTE — Telephone Encounter (Signed)
Pt currently in hospital due to seizure from low blood sugar, pt's wife states she thinks he is septic but unsure if this is correct. She is sure they( the staff) told her he has pneumonia. Wife is concerned about his appt on Tuesday and if he will receive chemo. Discussed with wife pt to keep appt 11/1 and MD will discuss treatment plan at that time. Wife would like clarification on if pt should take Decadron '4mg'$  BID day prior to his treatment 11/1. Will review with MD and notify wife with additional instructions. No further concerns at this time.

## 2015-10-19 NOTE — Progress Notes (Signed)
[facility] PROGRESS NOTE    Progress Note   PRANIT OWENSBY ZOX:[id number] DOB: [birth year] DOA: 10/17/2015 PCP: [name], MD   Brief Narrative:   Shawn Espinoza is an 74 y.o. male  With a history of diabetes mellitus and stage IV lung cancer, hypertension and hep C who presents to the hospital with hypoglycemia, with a diagnosis of community-acquired pneumonia.  Assessment/Plan:   Sepsis due to Lobar pneumonia ([place]): - Started empirically on community-acquired antibiotic regimen. - Urine Legionella was negative blood cultures and sputum cultures are negative - He is quite antibiotic treatment to Levaquin, start floor store.  Severe Hypoglycemia: - Multifactorial due to infectious etiology including Glimepiride, which has a half-life of greater than 24 hours. - Resolved, BG now significantly high, start low dose Lantus BID plus SSI. - He is on glipizide at home as he takes Decadron with chemotherapy, I will change him to glipizide for home use when he takes the Decadron. - We'll need to follow-up with PCP in one week.  Hyponatremia: - Likely due to prerenal azotemia, continue IV fluid hydration recheck a basic metabolic panel in the morning.  Essential hypertension - Continue to hold antihypertensive medication.  Adenocarcinoma of left lung, stage 4 ([place]) - Will defer chemotherapy cycle due to his infectious etiology.  AKI (acute kidney injury) ([place]) - Likely prerenal due to hypovolemia. - Resolved with IV hydration.  Aortic stenosis, moderate  Chemotherapy-induced anemia; - Mild drop in hemoglobin, likely hemodilution, he denies any bright red blood per rectum or melanotic stools. - Up out of bed and walking    DVT Prophylaxis - Lovenox ordered.  Family Communication: none Disposition Plan: Home in am Code Status:     Code Status Orders        Start     Ordered   10/18/15 0527  Full code   Continuous     10/18/15 0527    Advance  Directive Documentation        Most Recent Value   Type of Advance Directive  Healthcare Power of Attorney, Living will   Pre-existing out of facility DNR order (yellow form or pink MOST form)     "MOST" Form in Place?          IV Access:    Peripheral IV   Procedures and diagnostic studies:   Dg Chest 2 View  10/18/2015  CLINICAL DATA:  Acute onset of cough and fever.  Initial encounter. EXAM: CHEST  2 VIEW COMPARISON:  Chest radiograph performed 07/31/2015, and CT of the chest performed 09/22/2015 FINDINGS: The lungs are well-aerated. A small left pleural effusion is noted. Mild left perihilar and left basilar airspace opacities may reflect pneumonia, given the patient's symptoms. The right lung appears relatively clear. No pneumothorax is seen. The heart is borderline normal in size. A right-sided chest port is noted ending about the distal SVC. No acute osseous abnormalities are seen. IMPRESSION: Small left pleural effusion noted. Mild left perihilar and left basilar airspace opacities raise concern for pneumonia, given the patient's symptoms. Electronically Signed   By: Garald Balding M.D.   On: 10/18/2015 00:10     Medical Consultants:    None.  Anti-Infectives:   Anti-infectives    Start     Dose/Rate Route Frequency Ordered Stop   10/18/15 0800  azithromycin (ZITHROMAX) 500 mg in dextrose 5 % 250 mL IVPB     500 mg 250 mL/hr over 60 Minutes Intravenous Every 24 hours 10/18/15 0527 10/25/15 0759  10/18/15 0600  cefTRIAXone (ROCEPHIN) 1 g in dextrose 5 % 50 mL IVPB     1 g 100 mL/hr over 30 Minutes Intravenous Every 24 hours 10/18/15 0527 10/25/15 0559      Subjective:    Elisabeth Most he relates no appetite, slightly improved compared to yesterday.  Still weak though.  Objective:    Filed Vitals:   10/18/15 2100 10/18/15 2200 10/18/15 2347 10/19/15 0640  BP: 122/85 136/74 142/94 124/78  Pulse: 92 89 103 88  Temp:   97.5 F (36.4 C) 97.2 F (36.2 C)   TempSrc:   Oral Oral  Resp: '29 29 27 22  '$ Height:      Weight:      SpO2: 99% 6% 100% 100%    Intake/Output Summary (Last 24 hours) at 10/19/15 1216 Last data filed at 10/19/15 0900  Gross per 24 hour  Intake   1080 ml  Output      0 ml  Net   1080 ml   Filed Weights   10/18/15 0531  Weight: 84.9 kg (187 lb 2.7 oz)    Exam: Gen:  NAD Cardiovascular:  RRR, No M/R/G Chest and lungs:   CTAB Abdomen:  Abdomen soft, NT/ND, + BS Extremities:  No C/E/C   Data Reviewed:    Labs: Basic Metabolic Panel:  Recent Labs Lab 10/16/15 1050  10/17/15 2152 10/17/15 2356 10/18/15 0352 10/18/15 0815  NA 135*  --  137 136 132* 136  K 4.3  < > 3.6 3.8 3.7 3.9  CL  --   --  102 101 101 101  CO2 25  --  27  --  25 23  GLUCOSE 119  --  71 49* 171* 61*  BUN 19.9  --  27* 24* 27* 26*  CREATININE 1.2  --  1.41* 1.40* 1.35* 1.22  CALCIUM 8.8  --  7.7*  --  7.2* 7.8*  < > = values in this interval not displayed. GFR Estimated Creatinine Clearance: 57.4 mL/min (by C-G formula based on Cr of 1.22). Liver Function Tests:  Recent Labs Lab 10/16/15 1050 10/17/15 2152 10/18/15 0352 10/18/15 0815  AST '11 24 22 '$ 33  ALT <9 8* 8* 11*  ALKPHOS 72 59 45 64  BILITOT 0.90 0.6 0.6 0.8  PROT 5.6* 4.9* 4.0* 5.5*  ALBUMIN 2.3* 2.2* 1.8* 2.3*   No results for input(s): LIPASE, AMYLASE in the last 168 hours. No results for input(s): AMMONIA in the last 168 hours. Coagulation profile  Recent Labs Lab 10/18/15 0352  INR 1.92*    CBC:  Recent Labs Lab 10/16/15 1050 10/17/15 2152 10/17/15 2356 10/18/15 0352 10/18/15 0815  WBC 18.1* 9.4  --  6.6 11.9*  NEUTROABS 15.9* 8.5*  --  5.6  --   HGB 10.5* 8.8* 9.5* 7.3* 9.8*  HCT 33.0* 27.6* 28.0* 23.0* 30.7*  MCV 89.8 92.0  --  90.9 90.8  PLT 212 115*  --  88* 146*   Cardiac Enzymes:  Recent Labs Lab 10/18/15 0815  TROPONINI <0.03   BNP (last 3 results)  Recent Labs  08/18/15 1245  PROBNP 222.0*   CBG:  Recent Labs Lab  10/18/15 1602 10/18/15 1931 10/19/15 0034 10/19/15 0523 10/19/15 0734  GLUCAP 163* 235* 297* 308* 250*   D-Dimer: No results for input(s): DDIMER in the last 72 hours. Hgb A1c:  Recent Labs  10/18/15 0815  HGBA1C 4.9   Lipid Profile: No results for input(s): CHOL, HDL, LDLCALC, TRIG, CHOLHDL, LDLDIRECT  in the last 72 hours. Thyroid function studies:  Recent Labs  10/18/15 0815  TSH 8.566*   Anemia work up: No results for input(s): VITAMINB12, FOLATE, FERRITIN, TIBC, IRON, RETICCTPCT in the last 72 hours. Sepsis Labs:  Recent Labs Lab 10/16/15 1050 10/17/15 2138 10/17/15 2152 10/18/15 0352 10/18/15 0815  PROCALCITON  --   --   --  1.02  --   WBC 18.1*  --  9.4 6.6 11.9*  LATICACIDVEN  --  2.11*  --  2.0 2.7*   Microbiology Recent Results (from the past 240 hour(s))  Urine Culture     Status: None   Collection Time: 10/16/15 10:51 AM  Result Value Ref Range Status   Urine Culture, Routine Culture, Urine  Final    Comment: Final - ===== COLONY COUNT: ===== NO GROWTH NO GROWTH   Culture, blood (x 2)     Status: None (Preliminary result)   Collection Time: 10/18/15  3:50 AM  Result Value Ref Range Status   Specimen Description BLOOD LEFT ANTECUBITAL  Final   Special Requests BOTTLES DRAWN AEROBIC AND ANAEROBIC 5CC  Final   Culture   Final    NO GROWTH 1 DAY Performed at [facility]    Report Status PENDING  Incomplete  Culture, blood (x 2)     Status: None (Preliminary result)   Collection Time: 10/18/15  3:54 AM  Result Value Ref Range Status   Specimen Description BLOOD LEFT HAND  Final   Special Requests BOTTLES DRAWN AEROBIC AND ANAEROBIC 5CC  Final   Culture   Final    NO GROWTH 1 DAY Performed at [facility]    Report Status PENDING  Incomplete  MRSA PCR Screening     Status: None   Collection Time: 10/18/15  6:10 AM  Result Value Ref Range Status   MRSA by PCR NEGATIVE NEGATIVE Final    Comment:        The GeneXpert MRSA  Assay (FDA approved for NASAL specimens only), is one component of a comprehensive MRSA colonization surveillance program. It is not intended to diagnose MRSA infection nor to guide or monitor treatment for MRSA infections.   Culture, sputum-assessment     Status: None   Collection Time: 10/19/15 12:59 AM  Result Value Ref Range Status   Specimen Description SPUTUM  Final   Special Requests NONE  Final   Sputum evaluation   Final    THIS SPECIMEN IS ACCEPTABLE. RESPIRATORY CULTURE REPORT TO FOLLOW.   Report Status 10/19/2015 FINAL  Final     Medications:   . apixaban  5 mg Oral BID  . azithromycin  500 mg Intravenous Q24H  . beta carotene w/minerals  1 tablet Oral BID  . cefTRIAXone (ROCEPHIN)  IV  1 g Intravenous Q24H  . clotrimazole  1 application Topical BID  . fluorometholone  1 drop Both Eyes BID  . folic acid  1 mg Oral Daily  . metoprolol tartrate  25 mg Oral BID  . multivitamin with minerals  1 tablet Oral Daily  . polyethylene glycol  17 g Oral Daily  . sodium chloride  3 mL Intravenous Q12H  . sucralfate  1 g Oral TID WC & HS  . thiamine  100 mg Oral Daily  . traMADol  50 mg Oral QHS   Continuous Infusions:    Time spent: 25 min   LOS: 1 day   Charlynne Cousins  [facility] Pager [phone]  *Please refer to  [email or web address], password [email or web address] to get updated schedule on who will round on this patient, as hospitalists switch teams weekly. If 7PM-7AM, please contact night-coverage at [email or web address], password [email or web address] for any overnight needs.  10/19/2015, 12:16 PM

## 2015-10-19 NOTE — Progress Notes (Signed)
Patient latest blood sugar  Is 297, on call provider notified and no further orders given. We  will continue to monitor.

## 2015-10-20 DIAGNOSIS — I350 Nonrheumatic aortic (valve) stenosis: Secondary | ICD-10-CM

## 2015-10-20 DIAGNOSIS — C3492 Malignant neoplasm of unspecified part of left bronchus or lung: Secondary | ICD-10-CM

## 2015-10-20 LAB — GLUCOSE, CAPILLARY
GLUCOSE-CAPILLARY: 83 mg/dL (ref 65–99)
Glucose-Capillary: 72 mg/dL (ref 65–99)
Glucose-Capillary: 91 mg/dL (ref 65–99)

## 2015-10-20 LAB — LEGIONELLA PNEUMOPHILA SEROGP 1 UR AG: L. pneumophila Serogp 1 Ur Ag: NEGATIVE

## 2015-10-20 MED ORDER — SACCHAROMYCES BOULARDII 250 MG PO CAPS: 250.0000 mg | ORAL_CAPSULE | Freq: Two times a day (BID) | ORAL | Status: DC

## 2015-10-20 MED ORDER — SACCHAROMYCES BOULARDII 250 MG PO CAPS
250.0000 mg | ORAL_CAPSULE | Freq: Two times a day (BID) | ORAL | Status: DC
  Filled 2015-10-20 (×2): qty 1

## 2015-10-20 MED ORDER — HEPARIN SOD (PORK) LOCK FLUSH 100 UNIT/ML IV SOLN
500.0000 [IU] | INTRAVENOUS | Status: DC
  Filled 2015-10-20: qty 5

## 2015-10-20 MED ORDER — HEPARIN SOD (PORK) LOCK FLUSH 100 UNIT/ML IV SOLN
500.0000 [IU] | INTRAVENOUS | Status: DC
  Administered 2015-10-20: 500 [IU]
  Filled 2015-10-20: qty 5

## 2015-10-20 MED ORDER — LEVOFLOXACIN 500 MG PO TABS: 500.0000 mg | ORAL_TABLET | Freq: Every day | ORAL | Status: DC

## 2015-10-20 MED ORDER — GLIPIZIDE 5 MG PO TABS: 5.0000 mg | ORAL_TABLET | Freq: Every day | ORAL | Status: DC

## 2015-10-20 MED ORDER — HEPARIN SOD (PORK) LOCK FLUSH 100 UNIT/ML IV SOLN
500.0000 [IU] | INTRAVENOUS | Status: DC | PRN
  Filled 2015-10-20: qty 5

## 2015-10-20 MED ORDER — SODIUM CHLORIDE 0.9 % IJ SOLN: 10.0000 mL | Freq: Two times a day (BID) | INTRAMUSCULAR | Status: DC

## 2015-10-20 MED ORDER — SODIUM CHLORIDE 0.9 % IJ SOLN
10.0000 mL | INTRAMUSCULAR | Status: DC | PRN
Start: 2015-10-20 — End: 2015-10-20
  Administered 2015-10-20 (×2): 10 mL
  Filled 2015-10-20: qty 40

## 2015-10-20 NOTE — Care Management Important Message (Signed)
Important Message  Patient Details  Name: Shawn Espinoza MRN: [id number] Date of Birth: May 16, 1941   Medicare Important Message Given:  Yes-second notification given    Shelda Altes 10/20/2015, 3:43 Con[place]pc[place] Message  Patient Details  Name: Shawn Espinoza MRN: [id number] Date of Birth: 04-20-1941   Medicare Important Message Given:  Yes-second notification given    Shelda Altes 10/20/2015, 3:42 PM

## 2015-10-20 NOTE — Discharge Summary (Addendum)
Physician Discharge Summary  Shawn Espinoza:[id number] DOB: 1941/11/29 DOA: 10/17/2015  PCP: [name], MD  Admit date: 10/17/2015 Discharge date: 10/20/2015  Time spent: 35 minutes  Recommendations for Outpatient Follow-up:  1. Follow with primary care doctor in 1 week reviewed his CBGs, he was changed from glimepiride to glipizide as it has a shorter half-life.  Discharge Diagnoses:  Active Problems:   Lobar pneumonia (HCC)   Essential hypertension   Adenocarcinoma of left lung, stage 4 (HCC)   Hypoglycemia   AKI (acute kidney injury) ([place])   Aortic stenosis, moderate   Antineoplastic chemotherapy induced anemia   Pressure ulcer   Sacral pressure ulcer   Sepsis ([place])   Discharge Condition: stable  Diet recommendation: regular  Filed Weights   10/18/15 0531  Weight: 84.9 kg (187 lb 2.7 oz)    History of present illness:  74 year old with past medical history of diabetes 17, stage IV lung cancer that comes in with severe hypoglycemia, increased cough that is productive with a mild temperature of 100.7.  Hospital Course:  Sepsis due to community-acquired pneumonia: He was started on empiric treatment with Rocephin and azithromycin once he defervesced and his leukocytosis resolved he was changed to Levaquin. He will continue Levaquin for 4 additional days. He was started on Florastor start empirically, due to his high risk of developing C. Difficile.  Hypoglycemia: Multifactorial in the setting of infectious etiology and glimepiride. After being on D10 for 24 hours is resolved. He relates he only take the glimepiride when he take his Decadron with chemotherapy. His Glimepiride was changed to glipizide which has a shorter half-life, he will check his CBGs before meals and at bedtime and taken to his primary care doctor in 1 week.  Hyponatremia: Likely prerenal and resolved with IV fluid hydration.  Essential hypertension: His antihypertensive  medications were held on admission these will resume after discharge.  Adenocarcinoma of the left lung stage IV: Follow-up with oncology as an outpatient.  Acute kidney injury: Likely due to prerenal etiology did resolve with IV fluid hydration.  Chemotherapy-induced anemia: Hbg has remained stable.   Procedures:  CXR  Consultations:  none  Discharge Exam: Filed Vitals:   10/20/15 0700  BP: 135/80  Pulse: 91  Temp: 97.3 F (36.3 C)  Resp: 24    General: A&O x3 Cardiovascular: RRR Respiratory: good air movement CTA B/L  Discharge Instructions   Discharge Instructions    Diet - low sodium heart healthy    Complete by:  As directed      Increase activity slowly    Complete by:  As directed           Current Discharge Medication List    START taking these medications   Details  glipiZIDE (GLUCOTROL) 5 MG tablet Take 1 tablet (5 mg total) by mouth daily with breakfast. Qty: 10 tablet, Refills: 0    saccharomyces boulardii (FLORASTOR) 250 MG capsule Take 1 capsule (250 mg total) by mouth 2 (two) times daily. Qty: 30 capsule, Refills: 0      CONTINUE these medications which have CHANGED   Details  levofloxacin (LEVAQUIN) 500 MG tablet Take 1 tablet (500 mg total) by mouth daily. Qty: 5 tablet, Refills: 0   Associated Diagnoses: Adenocarcinoma of left lung, stage 4 (HCC)      CONTINUE these medications which have NOT CHANGED   Details  albuterol (PROVENTIL HFA;VENTOLIN HFA) 108 (90 BASE) MCG/ACT inhaler Inhale 2 puffs into the lungs every 6 (six) hours  as needed for wheezing or shortness of breath. Qty: 1 Inhaler, Refills: 3   Associated Diagnoses: Adenocarcinoma of left lung, stage 4 (HCC)    Alum & Mag Hydroxide-Simeth (MAGIC MOUTHWASH W/LIDOCAINE) SOLN Take 5 mLs by mouth 3 (three) times daily as needed for mouth pain. Qty: 5 mL, Refills: 0   Associated Diagnoses: Personal history of malignant neoplasm of bronchus and lung    clotrimazole (LOTRIMIN)  1 % cream Apply 1 application topically 2 (two) times daily. Qty: 60 g, Refills: 0    dexamethasone (DECADRON) 4 MG tablet 2 tab po bid, the day before, day of and day after chemotherapy every 3 weeks Qty: 40 tablet, Refills: 1    ELIQUIS 5 MG TABS tablet TAKE 1 TABLET (5 MG TOTAL) BY MOUTH 2 (TWO) TIMES DAILY. Qty: 60 tablet, Refills: 11    fluorometholone (FML) 0.1 % ophthalmic suspension Place 1 drop into both eyes 2 (two) times daily. Refills: 0    folic acid (FOLVITE) 1 MG tablet TAKE 1 TABLET BY MOUTH EVERY DAY Qty: 30 tablet, Refills: 4    guaiFENesin (MUCINEX) 600 MG 12 hr tablet Take 600 mg by mouth 2 (two) times daily as needed for cough or to loosen phlegm.     KLOR-CON M10 10 MEQ tablet Take 20 mEq by mouth daily as needed (with torsemide).  Refills: 10    lidocaine-prilocaine (EMLA) cream Apply 1 application topically as needed. Qty: 30 g, Refills: 1    loratadine (CLARITIN) 10 MG tablet Take 10 mg by mouth as directed. 3 days before chemo and 4 days after    metoprolol tartrate (LOPRESSOR) 25 MG tablet TAKE 1 TABLET TWICE A DAY Qty: 60 tablet, Refills: 3    Multiple Vitamins-Minerals (ICAPS) CAPS Take 1 capsule by mouth 2 (two) times daily.     polyethylene glycol (MIRALAX / GLYCOLAX) packet Take 8.5 g by mouth daily as needed for moderate constipation.     sucralfate (CARAFATE) 1 GM/10ML suspension Take 10 mLs (1 g total) by mouth 4 (four) times daily -  with meals and at bedtime. Qty: 420 mL, Refills: 0    torsemide (DEMADEX) 20 MG tablet Take 20 mg by mouth 2 (two) times daily. Takes one tablet daily and if swelling continues he takes a second pill    traMADol (ULTRAM) 50 MG tablet Take 1 tablet (50 mg total) by mouth every 6 (six) hours as needed. Qty: 45 tablet, Refills: 0    diphenoxylate-atropine (LOMOTIL) 2.5-0.025 MG per tablet Take 2 tablets by mouth 4 (four) times daily as needed for diarrhea or loose stools. Qty: 30 tablet, Refills: 0   Associated  Diagnoses: Diarrhea    hydrocortisone (ANUSOL-HC) 25 MG suppository Place 1 suppository (25 mg total) rectally 2 (two) times daily. Qty: 12 suppository, Refills: 0      STOP taking these medications     glimepiride (AMARYL) 1 MG tablet        Allergies  Allergen Reactions  . Candesartan Cilexetil     REACTION: Muscle spasms  . Diltiazem Hcl     REACTION: Dizziness  . Doxazosin Mesylate Other (See Comments)    REACTION: H/A's  . Nifedipine     REACTION: Leg swelling  . Pravastatin Other (See Comments)    Muscle aches  . Red Yeast Rice     Muscle aches  . Rosuvastatin Other (See Comments)    REACTION: questionable: severe constipation  . Sertraline Other (See Comments)    oversedation  .  Simvastatin Other (See Comments)    REACTION: Muscle aches  . Hydrocodone Other (See Comments)    constipation   Follow-up Information    Follow up with [name], MD In 1 week.   Specialty:  Family Medicine   Contact information:   [place] [id number] [phone]        The results of significant diagnostics from this hospitalization (including imaging, microbiology, ancillary and laboratory) are listed below for reference.    Significant Diagnostic Studies: Dg Chest 2 View  10/18/2015  CLINICAL DATA:  Acute onset of cough and fever.  Initial encounter. EXAM: CHEST  2 VIEW COMPARISON:  Chest radiograph performed 07/31/2015, and CT of the chest performed 09/22/2015 FINDINGS: The lungs are well-aerated. A small left pleural effusion is noted. Mild left perihilar and left basilar airspace opacities may reflect pneumonia, given the patient's symptoms. The right lung appears relatively clear. No pneumothorax is seen. The heart is borderline normal in size. A right-sided chest port is noted ending about the distal SVC. No acute osseous abnormalities are seen. IMPRESSION: Small left pleural effusion noted. Mild left perihilar and left basilar airspace  opacities raise concern for pneumonia, given the patient's symptoms. Electronically Signed   By: Garald Balding M.D.   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 EXAM: CT CHEST, ABDOMEN, AND PELVIS WITH CONTRAST TECHNIQUE: Multidetector CT imaging of the chest, abdomen and pelvis was performed following the standard protocol during bolus administration of intravenous contrast. CONTRAST:  160m OMNIPAQUE IOHEXOL 300 MG/ML  SOLN COMPARISON:  07/07/2015. FINDINGS: CT CHEST FINDINGS Mediastinum/Lymph Nodes: Right Port-A-Cath tip is at the junction of the SVC and RA. There is no axillary lymphadenopathy. No mediastinal lymphadenopathy. There is no hilar lymphadenopathy. Mild circumferential wall thickening is noted in the distal esophagus, slightly progressed in the interval. Heart size is normal. Trace pericardial effusion/thickening is unchanged. Lungs/Pleura: Hyperexpansion is consistent with emphysema. 12 mm nodule in the subpleural aspect of the lateral left upper lobe seen previously has resolved in the interval. The 10 mm peripheral nodule in the left lower lobe noted on the prior study is now 8 mm. The 8 mm right middle lobe pulmonary nodule seen previously has resolved in the interval. 9 mm left lower lobe nodule seen previously has resolved. Tiny bilateral pleural effusions are evident with some trace fluid tracking in the left major fissure. No pulmonary edema or focal airspace consolidation. Musculoskeletal: Bone windows reveal no worrisome lytic or sclerotic osseous lesions. CT ABDOMEN PELVIS FINDINGS Hepatobiliary: Dominant right hepatic lesion measures 5.7 x 6.8 cm today compared to 5.5 x 6.9 cm previously. No new liver lesion is evident. Layering high attenuation material in the gallbladder lumen likely related to layering sludge. No intrahepatic or extrahepatic biliary  dilation. Pancreas: No focal mass lesion. No dilatation of the main duct. No intraparenchymal cyst. No peripancreatic edema. Spleen: No splenomegaly. No focal mass lesion. Adrenals/Urinary Tract: No adrenal nodule or mass. No enhancing lesion in either kidney. No hydroureteronephrosis The urinary bladder appears normal for the degree of distention. Stomach/Bowel: Stomach is nondistended. No gastric wall thickening. No evidence of outlet obstruction. Duodenum is normally positioned as is the ligament of Treitz. No small bowel wall thickening. No small bowel dilatation. The terminal ileum is normal. The appendix is normal. Diverticular changes are noted in the left colon without evidence  of diverticulitis. Vascular/Lymphatic: There is abdominal aortic atherosclerosis without aneurysm. There is no gastrohepatic or hepatoduodenal ligament lymphadenopathy. No intraperitoneal or retroperitoneal lymphadenopy. 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 Reproductive: Prostate gland is mildly enlarged. Seminal vesicles unremarkable. Other: Small volume intraperitoneal free fluid noted. Musculoskeletal: Bone windows reveal no worrisome lytic or sclerotic osseous lesions. IMPRESSION: 1. Interval decrease/resolution of pulmonary nodules. 2. Stable appearance of the heterogeneously enhancing liver lesion. No additional abnormality within the liver parenchyma on today's exam. 3. Persistent circumferential wall thickening in the distal esophagus. Electronically Signed   By: Misty Stanley M.D.   On: 09/22/2015 12:44   Ct Abdomen Pelvis W Contrast  09/22/2015  CLINICAL DATA:  Subsequent encounter for lung cancer with metastatic disease to the hilum and cervical lymph nodes. EXAM: CT CHEST, ABDOMEN, AND PELVIS WITH CONTRAST TECHNIQUE: Multidetector CT imaging of the chest, abdomen and pelvis was performed following the standard protocol during bolus administration of intravenous contrast.  CONTRAST:  141m OMNIPAQUE IOHEXOL 300 MG/ML  SOLN COMPARISON:  07/07/2015. FINDINGS: CT CHEST FINDINGS Mediastinum/Lymph Nodes: Right Port-A-Cath tip is at the junction of the SVC and RA. There is no axillary lymphadenopathy. No mediastinal lymphadenopathy. There is no hilar lymphadenopathy. Mild circumferential wall thickening is noted in the distal esophagus, slightly progressed in the interval. Heart size is normal. Trace pericardial effusion/thickening is unchanged. Lungs/Pleura: Hyperexpansion is consistent with emphysema. 12 mm nodule in the subpleural aspect of the lateral left upper lobe seen previously has resolved in the interval. The 10 mm peripheral nodule in the left lower lobe noted on the prior study is now 8 mm. The 8 mm right middle lobe pulmonary nodule seen previously has resolved in the interval. 9 mm left lower lobe nodule seen previously has resolved. Tiny bilateral pleural effusions are evident with some trace fluid tracking in the left major fissure. No pulmonary edema or focal airspace consolidation. Musculoskeletal: Bone windows reveal no worrisome lytic or sclerotic osseous lesions. CT ABDOMEN PELVIS FINDINGS Hepatobiliary: Dominant right hepatic lesion measures 5.7 x 6.8 cm today compared to 5.5 x 6.9 cm previously. No new liver lesion is evident. Layering high attenuation material in the gallbladder lumen likely related to layering sludge. No intrahepatic or extrahepatic biliary dilation. Pancreas: No focal mass lesion. No dilatation of the main duct. No intraparenchymal cyst. No peripancreatic edema. Spleen: No splenomegaly. No focal mass lesion. Adrenals/Urinary Tract: No adrenal nodule or mass. No enhancing lesion in either kidney. No hydroureteronephrosis The urinary bladder appears normal for the degree of distention. Stomach/Bowel: Stomach is nondistended. No gastric wall thickening. No evidence of outlet obstruction. Duodenum is normally positioned as is the ligament of Treitz.  No small bowel wall thickening. No small bowel dilatation. The terminal ileum is normal. The appendix is normal. Diverticular changes are noted in the left colon without evidence of diverticulitis. Vascular/Lymphatic: There is abdominal aortic atherosclerosis without aneurysm. There is no gastrohepatic or hepatoduodenal ligament lymphadenopathy. No intraperitoneal or retroperitoneal lymphadenopy. 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 Reproductive: Prostate gland is mildly enlarged. Seminal vesicles unremarkable. Other: Small volume intraperitoneal free fluid noted. Musculoskeletal: Bone windows reveal no worrisome lytic or sclerotic osseous lesions. IMPRESSION: 1. Interval decrease/resolution of pulmonary nodules. 2. Stable appearance of the heterogeneously enhancing liver lesion. No additional abnormality within the liver parenchyma on today's exam. 3. Persistent circumferential wall thickening in the distal esophagus. Electronically Signed   By: EMisty StanleyM.D.   On: 09/22/2015 12:44  Microbiology: Recent Results (from the past 240 hour(s))  Urine Culture     Status: None   Collection Time: 10/16/15 10:51 AM  Result Value Ref Range Status   Urine Culture, Routine Culture, Urine  Final    Comment: Final - ===== COLONY COUNT: ===== NO GROWTH NO GROWTH   Culture, blood (x 2)     Status: None (Preliminary result)   Collection Time: 10/18/15  3:50 AM  Result Value Ref Range Status   Specimen Description BLOOD LEFT ANTECUBITAL  Final   Special Requests BOTTLES DRAWN AEROBIC AND ANAEROBIC 5CC  Final   Culture   Final    NO GROWTH 1 DAY Performed at [facility]    Report Status PENDING  Incomplete  Culture, blood (x 2)     Status: None (Preliminary result)   Collection Time: 10/18/15  3:54 AM  Result Value Ref Range Status   Specimen Description BLOOD LEFT HAND  Final   Special Requests BOTTLES DRAWN AEROBIC AND ANAEROBIC 5CC  Final    Culture   Final    NO GROWTH 1 DAY Performed at [facility]    Report Status PENDING  Incomplete  MRSA PCR Screening     Status: None   Collection Time: 10/18/15  6:10 AM  Result Value Ref Range Status   MRSA by PCR NEGATIVE NEGATIVE Final    Comment:        The GeneXpert MRSA Assay (FDA approved for NASAL specimens only), is one component of a comprehensive MRSA colonization surveillance program. It is not intended to diagnose MRSA infection nor to guide or monitor treatment for MRSA infections.   Culture, sputum-assessment     Status: None   Collection Time: 10/19/15 12:59 AM  Result Value Ref Range Status   Specimen Description SPUTUM  Final   Special Requests NONE  Final   Sputum evaluation   Final    THIS SPECIMEN IS ACCEPTABLE. RESPIRATORY CULTURE REPORT TO FOLLOW.   Report Status 10/19/2015 FINAL  Final  Culture, respiratory (NON-Expectorated)     Status: None (Preliminary result)   Collection Time: 10/19/15 12:59 AM  Result Value Ref Range Status   Specimen Description SPUTUM  Final   Special Requests NONE  Final   Gram Stain PENDING  Incomplete   Culture   Final    Culture reincubated for better growth Performed at [facility]    Report Status PENDING  Incomplete     Labs: Basic Metabolic Panel:  Recent Labs Lab 10/16/15 1050 10/17/15 2152 10/17/15 2356 10/18/15 0352 10/18/15 0815  NA 135* 137 136 132* 136  K 4.3 3.6 3.8 3.7 3.9  CL  --  102 101 101 101  CO2 25 27  --  25 23  GLUCOSE 119 71 49* 171* 61*  BUN 19.9 27* 24* 27* 26*  CREATININE 1.2 1.41* 1.40* 1.35* 1.22  CALCIUM 8.8 7.7*  --  7.2* 7.8*   Liver Function Tests:  Recent Labs Lab 10/16/15 1050 10/17/15 2152 10/18/15 0352 10/18/15 0815  AST '11 24 22 '$ 33  ALT <9 8* 8* 11*  ALKPHOS 72 59 45 64  BILITOT 0.90 0.6 0.6 0.8  PROT 5.6* 4.9* 4.0* 5.5*  ALBUMIN 2.3* 2.2* 1.8* 2.3*   No results for input(s): LIPASE, AMYLASE in the last 168 hours. No results for  input(s): AMMONIA in the last 168 hours. CBC:  Recent Labs Lab 10/16/15 1050 10/17/15 2152 10/17/15 2356 10/18/15 0352 10/18/15 0815  WBC 18.1* 9.4  --  6.6 11.9*  NEUTROABS 15.9* 8.5*  --  5.6  --   HGB 10.5* 8.8* 9.5* 7.3* 9.8*  HCT 33.0* 27.6* 28.0* 23.0* 30.7*  MCV 89.8 92.0  --  90.9 90.8  PLT 212 115*  --  88* 146*   Cardiac Enzymes:  Recent Labs Lab 10/18/15 0815  TROPONINI <0.03   BNP: BNP (last 3 results) No results for input(s): BNP in the last 8760 hours.  ProBNP (last 3 results)  Recent Labs  08/18/15 1245  PROBNP 222.0*    CBG:  Recent Labs Lab 10/19/15 1641 10/19/15 2006 10/20/15 0025 10/20/15 0409 10/20/15 0731  GLUCAP 330* 197* 91 72 83       Signed:  FELIZ Espinoza, Shawn Espinoza  [facility] 10/20/2015, 11:56 AM

## 2015-10-22 LAB — CULTURE, BLOOD (SINGLE)

## 2015-10-23 ENCOUNTER — Telehealth: Payer: Self-pay | Admitting: *Deleted

## 2015-10-23 ENCOUNTER — Other Ambulatory Visit: Payer: Self-pay | Admitting: Medical Oncology

## 2015-10-23 ENCOUNTER — Encounter: Payer: Self-pay | Admitting: Family Medicine

## 2015-10-23 ENCOUNTER — Ambulatory Visit (INDEPENDENT_AMBULATORY_CARE_PROVIDER_SITE_OTHER): Payer: Medicare Other | Admitting: Family Medicine

## 2015-10-23 ENCOUNTER — Telehealth: Payer: Self-pay | Admitting: Medical Oncology

## 2015-10-23 ENCOUNTER — Telehealth: Payer: Self-pay | Admitting: Internal Medicine

## 2015-10-23 VITALS — BP 112/66 | HR 84 | Temp 97.2°F | Wt 183.8 lb

## 2015-10-23 DIAGNOSIS — R0609 Other forms of dyspnea: Secondary | ICD-10-CM | POA: Diagnosis not present

## 2015-10-23 DIAGNOSIS — T380X5A Adverse effect of glucocorticoids and synthetic analogues, initial encounter: Secondary | ICD-10-CM

## 2015-10-23 DIAGNOSIS — I10 Essential (primary) hypertension: Secondary | ICD-10-CM

## 2015-10-23 DIAGNOSIS — L89159 Pressure ulcer of sacral region, unspecified stage: Secondary | ICD-10-CM

## 2015-10-23 DIAGNOSIS — E099 Drug or chemical induced diabetes mellitus without complications: Secondary | ICD-10-CM | POA: Diagnosis not present

## 2015-10-23 DIAGNOSIS — R609 Edema, unspecified: Secondary | ICD-10-CM

## 2015-10-23 DIAGNOSIS — J181 Lobar pneumonia, unspecified organism: Secondary | ICD-10-CM

## 2015-10-23 DIAGNOSIS — E46 Unspecified protein-calorie malnutrition: Secondary | ICD-10-CM

## 2015-10-23 DIAGNOSIS — C3492 Malignant neoplasm of unspecified part of left bronchus or lung: Secondary | ICD-10-CM | POA: Diagnosis not present

## 2015-10-23 LAB — CULTURE, BLOOD (ROUTINE X 2)
CULTURE: NO GROWTH
CULTURE: NO GROWTH

## 2015-10-23 NOTE — Assessment & Plan Note (Signed)
With recent hypoglycemia from glimepiride and poor PO intake while sick.  Discussed new regimen of glipizide '5mg'$  daily, advised to only take on days he takes decadron in preparation for chemo. Advised monitor sugars and if persistently uncontrolled on once daily glipizide may increase to BID - but only on decadron days

## 2015-10-23 NOTE — Assessment & Plan Note (Signed)
Discussed barrier cream use

## 2015-10-23 NOTE — Assessment & Plan Note (Signed)
See above. Discussed torsemide dosing

## 2015-10-23 NOTE — Progress Notes (Signed)
Pre visit review using our clinic review tool, if applicable. No additional management support is needed unless otherwise documented below in the visit note. 

## 2015-10-23 NOTE — Telephone Encounter (Signed)
I left a message for pt and wife that his appts for this week are cancelled and will be r/s for next week

## 2015-10-23 NOTE — Telephone Encounter (Signed)
Per staff phone call and POF I have schedueld appts. Scheduler advised of appts.  JMW  

## 2015-10-23 NOTE — Assessment & Plan Note (Signed)
Remains with marked DOE. Consider changing metoprolol to 12.'5mg'$  bid.

## 2015-10-23 NOTE — Telephone Encounter (Signed)
Returned call to patient wife re cancellation of 11/1 appointments. Left message on home phone and wife's cell [phone]) confirming all appointments for 11/1 @ MM's office have been cancelled. Confirmed next appointments for 11/8 and 11/9. Appointments for the week of 11/15 remain as scheduled in case patient is not able to have tx 11/8. See also 11/1 pof. Schedule mailed.

## 2015-10-23 NOTE — Telephone Encounter (Signed)
VM message from pt's wife received @ 8:39 am regarding pt's upcoming chemo therapy on 10/24/15. Pt was discharged from hospital on 10/21/15. He had pneumonia and hypoglycemia. Wife is asking if patient will be receiving chemo this week and if he should go ahead and start his decadron. Spoke with [employer], RN-nurse with Dr. Benay Spice. She will discuss with Dr. Benay Spice and call pt's wife back.

## 2015-10-23 NOTE — Telephone Encounter (Signed)
Onc tx request sent for r/s

## 2015-10-23 NOTE — Assessment & Plan Note (Signed)
Discussed glucerna use vs ensure.

## 2015-10-23 NOTE — Patient Instructions (Addendum)
Try glipizide once daily while on decadron. If sugars consistently >200, increase to glipizide twice daily with meal.  Orthostatic vital signs today.  Take torsemide twice daily for 2 days then try once daily dosing to see if that will control swelling.  Take glucerna 1 supplement daily 30 min prior to lunch time.  Keep appointment in 2 weeks. Nice to see you today, call us with questions.

## 2015-10-23 NOTE — Assessment & Plan Note (Signed)
See above. Remains dyspneic.

## 2015-10-23 NOTE — Assessment & Plan Note (Signed)
Seems resolving. Completing levaquin dose.

## 2015-10-23 NOTE — Assessment & Plan Note (Signed)
Appreciate onc care.  

## 2015-10-23 NOTE — Progress Notes (Signed)
BP 112/66 mmHg  Pulse 84  Temp(Src) 97.2 F (36.2 C) (Oral)  Wt 183 lb 12 oz (83.348 kg)   CC: transitional care visit  Subjective:    Patient ID: Shawn Espinoza, male    DOB: 02-Aug-1941, 74 y.o.   MRN: [id number]  HPI: Shawn Espinoza is a 74 y.o. male presenting on 10/23/2015 for Follow-up   Pleasant 74 yo with stage IV lung cancer presents for hosp f/u visit after recent admission with hypoglycemia to 31 with seizure, pneumonia with sepsis and acute kidney injury that resolved with IVF rehydration. Treated with rocephin/azithro and transitioned to levaquin oral. Started on empiric florastor proboitic. Persistent productive cough with white phlegm. Has 2 more days of levaquin left. Denies fevers, chest pain. Stays dyspneic on minimal exertion at baseline. Chronically on 2L by [place].   He does have pressure ulcer on sacrum. Not painful. Treats with barrier cream.  For hypoglycemia - glipmepiride was changed to glipizide with a shorter half-life. Sugar 145 fasting this morning, 200 after dinner last night.  For pedal edema - asks about torsemide dosing. In hospital off torsemide. Now has restarted '20mg'$  BID.   F/u phone call: 10/23/2015 Admit date: 10/17/2015 Discharge date: 10/20/2015  Recommendations for Outpatient Follow-up:  1. Follow with primary care doctor in 1 week reviewed his CBGs, he was changed from glimepiride to glipizide as it has a shorter half-life.  Discharge Diagnoses:  Active Problems:  Lobar pneumonia ([place])  Essential hypertension  Adenocarcinoma of left lung, stage 4 (HCC)  Hypoglycemia  AKI (acute kidney injury) ([place])  Aortic stenosis, moderate  Antineoplastic chemotherapy induced anemia  Pressure ulcer  Sacral pressure ulcer  Sepsis ([place])  Discharge Condition: stable  Diet recommendation: regular  Relevant past medical, surgical, family and social history reviewed and updated as indicated. Interim medical history since our last  visit reviewed. Allergies and medications reviewed and updated. Current Outpatient Prescriptions on File Prior to Visit  Medication Sig  . albuterol (PROVENTIL HFA;VENTOLIN HFA) 108 (90 BASE) MCG/ACT inhaler Inhale 2 puffs into the lungs every 6 (six) hours as needed for wheezing or shortness of breath.  . Alum & Mag Hydroxide-Simeth (MAGIC MOUTHWASH W/LIDOCAINE) SOLN Take 5 mLs by mouth 3 (three) times daily as needed for mouth pain.  . clotrimazole (LOTRIMIN) 1 % cream Apply 1 application topically 2 (two) times daily.  Marland Kitchen dexamethasone (DECADRON) 4 MG tablet 2 tab po bid, the day before, day of and day after chemotherapy every 3 weeks  . ELIQUIS 5 MG TABS tablet TAKE 1 TABLET (5 MG TOTAL) BY MOUTH 2 (TWO) TIMES DAILY.  . fluorometholone (FML) 0.1 % ophthalmic suspension Place 1 drop into both eyes 2 (two) times daily.  . folic acid (FOLVITE) 1 MG tablet TAKE 1 TABLET BY MOUTH EVERY DAY  . glipiZIDE (GLUCOTROL) 5 MG tablet Take 1 tablet (5 mg total) by mouth daily with breakfast.  . guaiFENesin (MUCINEX) 600 MG 12 hr tablet Take 600 mg by mouth 2 (two) times daily as needed for cough or to loosen phlegm.   Marland Kitchen KLOR-CON M10 10 MEQ tablet Take 20 mEq by mouth daily as needed (with torsemide).   Marland Kitchen levofloxacin (LEVAQUIN) 500 MG tablet Take 1 tablet (500 mg total) by mouth daily.  Marland Kitchen lidocaine-prilocaine (EMLA) cream Apply 1 application topically as needed.  . loratadine (CLARITIN) 10 MG tablet Take 10 mg by mouth as directed. 3 days before chemo and 4 days after  . metoprolol tartrate (LOPRESSOR) 25 MG  tablet TAKE 1 TABLET TWICE A DAY  . Multiple Vitamins-Minerals (ICAPS) CAPS Take 1 capsule by mouth 2 (two) times daily.   . polyethylene glycol (MIRALAX / GLYCOLAX) packet Take 8.5 g by mouth daily as needed for moderate constipation.   . saccharomyces boulardii (FLORASTOR) 250 MG capsule Take 1 capsule (250 mg total) by mouth 2 (two) times daily.  . sucralfate (CARAFATE) 1 GM/10ML suspension Take 10  mLs (1 g total) by mouth 4 (four) times daily -  with meals and at bedtime.  . torsemide (DEMADEX) 20 MG tablet Take 20 mg by mouth 2 (two) times daily. Takes one tablet daily and if swelling continues he takes a second pill  . traMADol (ULTRAM) 50 MG tablet Take 1 tablet (50 mg total) by mouth every 6 (six) hours as needed. (Patient taking differently: Take 50 mg by mouth at bedtime. )  . diphenoxylate-atropine (LOMOTIL) 2.5-0.025 MG per tablet Take 2 tablets by mouth 4 (four) times daily as needed for diarrhea or loose stools. (Patient not taking: Reported on 10/16/2015)  . hydrocortisone (ANUSOL-HC) 25 MG suppository Place 1 suppository (25 mg total) rectally 2 (two) times daily. (Patient not taking: Reported on 10/16/2015)   No current facility-administered medications on file prior to visit.    Review of Systems Per HPI unless specifically indicated in ROS section     Objective:    BP 112/66 mmHg  Pulse 84  Temp(Src) 97.2 F (36.2 C) (Oral)  Wt 183 lb 12 oz (83.348 kg)  Wt Readings from Last 3 Encounters:  10/23/15 183 lb 12 oz (83.348 kg)  10/18/15 187 lb 2.7 oz (84.9 kg)  09/26/15 187 lb 9.6 oz (85.095 kg)    Physical Exam  Constitutional: He appears well-developed and well-nourished. No distress.  HENT:  Head: Normocephalic and atraumatic.  Mouth/Throat: Oropharynx is clear and moist. No oropharyngeal exudate.  Eyes: Conjunctivae and EOM are normal. Pupils are equal, round, and reactive to light. No scleral icterus.  Neck: Normal range of motion. Neck supple.  Cardiovascular: Normal rate, regular rhythm, normal heart sounds and intact distal pulses.   No murmur heard. Pulmonary/Chest: Effort normal and breath sounds normal. No respiratory distress. He has no wheezes. He has no rales.  Lungs clear  Genitourinary: Rectal exam shows external hemorrhoid.  External hemorrhoids present. Posterior to anus small opening filled with barrier cream  Musculoskeletal: He exhibits  edema (woody slightly pitting edema throughout legs into waist).  Skin: Skin is warm and dry. Ecchymosis (small throughout forearms) noted.  Nursing note and vitals reviewed.   Blood cultures x2 NG final.  CHEST 2 VIEW COMPARISON: Chest radiograph performed 07/31/2015, and CT of the chest performed 09/22/2015 FINDINGS: The lungs are well-aerated. A small left pleural effusion is noted. Mild left perihilar and left basilar airspace opacities may reflect pneumonia, given the patient's symptoms. The right lung appears relatively clear. No pneumothorax is seen. The heart is borderline normal in size. A right-sided chest port is noted ending about the distal SVC. No acute osseous abnormalities are seen. IMPRESSION: Small left pleural effusion noted. Mild left perihilar and left basilar airspace opacities raise concern for pneumonia, given the patient's symptoms. Electronically Signed  By: Garald Balding M.D.  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  Discussed new regimen of glipizide '5mg'$  daily, advised to  only take on days he takes decadron in preparation for chemo. Advised monitor sugars and if persistently uncontrolled on once daily glipizide may increase to BID - but only on decadron days      Sacral pressure ulcer    Discussed barrier cream use      Protein-calorie malnutrition (HCC)    Discussed glucerna use vs ensure.      Peripheral edema    See above. Discussed torsemide dosing      Lobar pneumonia ([place]) - Primary    Seems resolving. Completing levaquin dose.      Essential hypertension    Remains with marked DOE. Consider changing metoprolol to 12.'5mg'$  bid.      DOE (dyspnea on exertion)    See above. Remains dyspneic.      Adenocarcinoma of left lung, stage 4 ([place])    Appreciate onc care.             Follow up plan: Return in about 2 weeks (around 11/06/2015), or as needed, for follow up visit.

## 2015-10-23 NOTE — Telephone Encounter (Signed)
Transition Care Management Follow-up Telephone Call   Date discharged? 10/20/15   How have you been since you were released from the hospital? Doing well   Do you understand why you were in the hospital? yes   Do you understand the discharge instructions? yes   Where were you discharged to? Home   Items Reviewed:  Medications reviewed: yes  Allergies reviewed: yes  Dietary changes reviewed: no  Referrals reviewed: no   Functional Questionnaire:   Activities of Daily Living (ADLs):   He states they are independent in the following: ambulation, bathing and hygiene, feeding, continence, grooming, toileting and dressing States they require assistance with the following: None   Any transportation issues/concerns?: no   Any patient concerns? yes, medication changes   Confirmed importance and date/time of follow-up visits scheduled yes, 10/23/15 @ 12  Provider Appointment booked with [name], MD  Confirmed with patient if condition begins to worsen call PCP or go to the ER.  Patient was given the office number and encouraged to call back with question or concerns.  : yes

## 2015-10-23 NOTE — Telephone Encounter (Signed)
Please reschedule for next week.

## 2015-10-24 ENCOUNTER — Telehealth: Payer: Self-pay | Admitting: Internal Medicine

## 2015-10-24 ENCOUNTER — Other Ambulatory Visit: Payer: Medicare Other

## 2015-10-24 ENCOUNTER — Ambulatory Visit: Payer: Medicare Other | Admitting: Physician Assistant

## 2015-10-24 ENCOUNTER — Ambulatory Visit: Payer: Medicare Other

## 2015-10-24 NOTE — Telephone Encounter (Signed)
returned call and lvm confirming 11.8 appt per pt request.

## 2015-10-24 NOTE — Telephone Encounter (Signed)
returned wifes call and advised that we have no available later appt time

## 2015-10-25 ENCOUNTER — Telehealth: Payer: Self-pay | Admitting: *Deleted

## 2015-10-25 NOTE — Telephone Encounter (Signed)
Spoke to Shawn Espinoza. Pt is doing better, sleeping through much of the day. Pt has a little fluid built up since leaving the hospital. Seems to be slowly resolving with use of prescribed diuretics. Pt has been pushing fluids and eating well. No concerns with bowel or bladder function. Denies any uncontrolled pain. Wife reports a general improvement overall. Discussed short low impact activities to help pt regain mobility. Pt is going to ambulate a few times a day as tolerated. Advised wife to call this office if any concerns or questions.

## 2015-10-26 ENCOUNTER — Ambulatory Visit: Payer: Medicare Other

## 2015-10-31 ENCOUNTER — Telehealth: Payer: Self-pay | Admitting: Internal Medicine

## 2015-10-31 ENCOUNTER — Other Ambulatory Visit (HOSPITAL_BASED_OUTPATIENT_CLINIC_OR_DEPARTMENT_OTHER): Payer: Medicare Other

## 2015-10-31 ENCOUNTER — Other Ambulatory Visit: Payer: Medicare Other

## 2015-10-31 ENCOUNTER — Ambulatory Visit (HOSPITAL_BASED_OUTPATIENT_CLINIC_OR_DEPARTMENT_OTHER): Payer: Medicare Other | Admitting: Physician Assistant

## 2015-10-31 ENCOUNTER — Telehealth: Payer: Self-pay

## 2015-10-31 ENCOUNTER — Ambulatory Visit (HOSPITAL_BASED_OUTPATIENT_CLINIC_OR_DEPARTMENT_OTHER): Payer: Medicare Other

## 2015-10-31 ENCOUNTER — Ambulatory Visit: Payer: Medicare Other

## 2015-10-31 VITALS — BP 140/78 | HR 84 | Temp 97.5°F | Resp 20 | Ht 69.0 in | Wt 184.2 lb

## 2015-10-31 DIAGNOSIS — R05 Cough: Secondary | ICD-10-CM | POA: Diagnosis not present

## 2015-10-31 DIAGNOSIS — Z5112 Encounter for antineoplastic immunotherapy: Secondary | ICD-10-CM | POA: Diagnosis not present

## 2015-10-31 DIAGNOSIS — I4891 Unspecified atrial fibrillation: Secondary | ICD-10-CM | POA: Diagnosis not present

## 2015-10-31 DIAGNOSIS — R0602 Shortness of breath: Secondary | ICD-10-CM

## 2015-10-31 DIAGNOSIS — Z5111 Encounter for antineoplastic chemotherapy: Secondary | ICD-10-CM | POA: Diagnosis not present

## 2015-10-31 DIAGNOSIS — R609 Edema, unspecified: Secondary | ICD-10-CM

## 2015-10-31 DIAGNOSIS — Z95828 Presence of other vascular implants and grafts: Secondary | ICD-10-CM

## 2015-10-31 DIAGNOSIS — C3492 Malignant neoplasm of unspecified part of left bronchus or lung: Secondary | ICD-10-CM

## 2015-10-31 DIAGNOSIS — C3411 Malignant neoplasm of upper lobe, right bronchus or lung: Secondary | ICD-10-CM

## 2015-10-31 DIAGNOSIS — H1132 Conjunctival hemorrhage, left eye: Secondary | ICD-10-CM | POA: Diagnosis not present

## 2015-10-31 DIAGNOSIS — D709 Neutropenia, unspecified: Secondary | ICD-10-CM | POA: Diagnosis not present

## 2015-10-31 DIAGNOSIS — R530 Neoplastic (malignant) related fatigue: Secondary | ICD-10-CM

## 2015-10-31 DIAGNOSIS — Z79899 Other long term (current) drug therapy: Secondary | ICD-10-CM | POA: Diagnosis not present

## 2015-10-31 LAB — COMPREHENSIVE METABOLIC PANEL (CC13)
ALBUMIN: 2.5 g/dL — AB (ref 3.5–5.0)
AST: 8 U/L (ref 5–34)
Alkaline Phosphatase: 71 U/L (ref 40–150)
Anion Gap: 11 mEq/L (ref 3–11)
BUN: 18.2 mg/dL (ref 7.0–26.0)
CHLORIDE: 106 meq/L (ref 98–109)
CO2: 24 mEq/L (ref 22–29)
CREATININE: 0.8 mg/dL (ref 0.7–1.3)
Calcium: 8.8 mg/dL (ref 8.4–10.4)
EGFR: 87 mL/min/{1.73_m2} — ABNORMAL LOW (ref >90)
GLUCOSE: 244 mg/dL — AB (ref 70–140)
POTASSIUM: 4.4 meq/L (ref 3.5–5.1)
SODIUM: 141 meq/L (ref 136–145)
Total Bilirubin: 0.32 mg/dL (ref 0.20–1.20)
Total Protein: 5.3 g/dL — ABNORMAL LOW (ref 6.4–8.3)

## 2015-10-31 LAB — CBC WITH DIFFERENTIAL/PLATELET
BASO%: 0.1 % (ref 0.0–2.0)
Basophils Absolute: 0 10*3/uL (ref 0.0–0.1)
EOS ABS: 0 10*3/uL (ref 0.0–0.5)
EOS%: 0 % (ref 0.0–7.0)
HCT: 31.6 % — ABNORMAL LOW (ref 38.4–49.9)
HEMOGLOBIN: 10.1 g/dL — AB (ref 13.0–17.1)
LYMPH#: 0.5 10*3/uL — AB (ref 0.9–3.3)
LYMPH%: 5.3 % — AB (ref 14.0–49.0)
MCH: 28.7 pg (ref 27.2–33.4)
MCHC: 31.8 g/dL — ABNORMAL LOW (ref 32.0–36.0)
MCV: 90.1 fL (ref 79.3–98.0)
MONO#: 0.1 10*3/uL (ref 0.1–0.9)
MONO%: 0.9 % (ref 0.0–14.0)
NEUT%: 93.7 % — ABNORMAL HIGH (ref 39.0–75.0)
NEUTROS ABS: 8 10*3/uL — AB (ref 1.5–6.5)
PLATELETS: 156 10*3/uL (ref 140–400)
RBC: 3.51 10*6/uL — AB (ref 4.20–5.82)
RDW: 17.1 % — AB (ref 11.0–14.6)
WBC: 8.5 10*3/uL (ref 4.0–10.3)

## 2015-10-31 LAB — MAGNESIUM (CC13): Magnesium: 1.8 mg/dl (ref 1.5–2.5)

## 2015-10-31 LAB — UA PROTEIN, DIPSTICK - CHCC: Protein, ur: NEGATIVE mg/dL

## 2015-10-31 LAB — TSH CHCC: TSH: 1.723 m[IU]/L (ref 0.320–4.118)

## 2015-10-31 MED ORDER — DIPHENHYDRAMINE HCL 50 MG/ML IJ SOLN
50.0000 mg | Freq: Once | INTRAMUSCULAR | Status: AC
  Administered 2015-10-31: 50 mg via INTRAVENOUS

## 2015-10-31 MED ORDER — HEPARIN SOD (PORK) LOCK FLUSH 100 UNIT/ML IV SOLN
500.0000 [IU] | Freq: Once | INTRAVENOUS | Status: AC | PRN
  Administered 2015-10-31: 500 [IU]
  Filled 2015-10-31: qty 5

## 2015-10-31 MED ORDER — ACETAMINOPHEN 325 MG PO TABS
ORAL_TABLET | ORAL | Status: AC
  Filled 2015-10-31: qty 2

## 2015-10-31 MED ORDER — DIPHENHYDRAMINE HCL 50 MG/ML IJ SOLN
INTRAMUSCULAR | Status: AC
  Filled 2015-10-31: qty 1

## 2015-10-31 MED ORDER — SODIUM CHLORIDE 0.9 % IJ SOLN
10.0000 mL | INTRAMUSCULAR | Status: DC | PRN
  Administered 2015-10-31: 10 mL via INTRAVENOUS
  Filled 2015-10-31: qty 10

## 2015-10-31 MED ORDER — SODIUM CHLORIDE 0.9 % IV SOLN
Freq: Once | INTRAVENOUS | Status: AC
  Administered 2015-10-31: 11:00:00 via INTRAVENOUS

## 2015-10-31 MED ORDER — SODIUM CHLORIDE 0.9 % IV SOLN
Freq: Once | INTRAVENOUS | Status: AC
  Administered 2015-10-31: 14:00:00 via INTRAVENOUS
  Filled 2015-10-31: qty 4

## 2015-10-31 MED ORDER — SODIUM CHLORIDE 0.9 % IV SOLN
60.0000 mg/m2 | Freq: Once | INTRAVENOUS | Status: AC
  Administered 2015-10-31: 120 mg via INTRAVENOUS
  Filled 2015-10-31: qty 12

## 2015-10-31 MED ORDER — SODIUM CHLORIDE 0.9 % IJ SOLN
10.0000 mL | INTRAMUSCULAR | Status: DC | PRN
  Administered 2015-10-31: 10 mL
  Filled 2015-10-31: qty 10

## 2015-10-31 MED ORDER — ACETAMINOPHEN 325 MG PO TABS
650.0000 mg | ORAL_TABLET | Freq: Once | ORAL | Status: AC
  Administered 2015-10-31: 650 mg via ORAL

## 2015-10-31 MED ORDER — SODIUM CHLORIDE 0.9 % IV SOLN
10.0000 mg/kg | Freq: Once | INTRAVENOUS | Status: AC
  Administered 2015-10-31: 800 mg via INTRAVENOUS
  Filled 2015-10-31: qty 70

## 2015-10-31 NOTE — Progress Notes (Signed)
[facility] [phone]   [phone] [place], [place] [phone]  DIAGNOSIS: Stage IIIB/IV non-small cell lung cancer, adenocarcinoma diagnosed in March of 2015.  PRIOR THERAPY:  1) Systemic chemotherapy with carboplatin for AUC of 5 and Alimta 500 mg/M2 every 3 weeks. First dose expected on 04/13/2014. Status post 6 cycles. 2)  Maintenance chemotherapy with single agent Alimta 500 mg/M2 every 3 weeks. First cycle on 09/06/2014. Status post 3 cycles. 3) Second course of systemic chemotherapy with carboplatin for AUC of 5 and Alimta 500 MG/M2 every 3 weeks. First cycle 11/08/2014. Status post 6 cycles. 4)  Immunotherapy with Nivolumab 3 MG/KG every 2 weeks. First dose 05/24/2015. Status post 3 cycles.  CURRENT THERAPY: Systemic chemotherapy with docetaxel 60 MG/M2 and Cyramza 10 MG/KG every 3 weeks status post 4 cycles (last cycle on 10/4).  INTERVAL HISTORY: Shawn Espinoza 74 y.o. male returns to the clinic today for follow up visit accompanied by his wife. He is currently undergoing systemic chemotherapy with docetaxel and Cyramza status post 4 cycles.  He is here for cycle 5. He is rating his treatment well except for the fatigue for around 7 days after the Neulasta injection. His last week was very good and the patient was very active. He denied having any significant chest pain but continues to have shortness of breath with exertion and mild cough with no hemoptysis. He has no significant fever or chills, no nausea or vomiting.He had one episode of diarrhea, self resolved, with no recurrence. He denies any bleeding issues, with the exception of the development of a subconjunctival hemorrhage, after forceful coughing, without producing any vision changes. This is recurrent, however, he is to see an ophthalmologist this week, for further checkup. No other complaints are expressed by the  patient. MEDICAL HISTORY: Past Medical History  Diagnosis Date  . History of diabetes mellitus, type II     resolved with diet, h/o neuropathy  . Diverticulosis of colon   . Hyperlipidemia   . Hypertension   . Fatty liver 02/29/00    abd ultrasound  . Lower back pain   . Systolic murmur 1324    2Decho - normal LV fxn, EF 55%, mild AS, biatrial enlargement  . History of tobacco use quit 1990s  . 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 Personal history of colonic adenomas 07/06/2013  . Hypertensive retinopathy of both eyes 2015    mild  . Dysrhythmia     Atrial fib (found 07/2014)  . Shortness of breath   . Pneumonia   . GERD (gastroesophageal reflux disease)     occasional  . Arthritis   . Neuropathy ([place])   . Constipation   . 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 Diabetes mellitus without complication (HCC)     Type 2  . Depression   . Non-small cell carcinoma of left lung ([place]) 02/2014    stage IIIb/IV on chemo  . Full code status 09/26/2015    ALLERGIES:  is allergic to candesartan cilexetil; diltiazem hcl; doxazosin mesylate; nifedipine; pravastatin; red yeast rice; rosuvastatin; sertraline; simvastatin; and hydrocodone.  MEDICATIONS:  Current Outpatient Prescriptions  Medication Sig Dispense Refill  . albuterol (PROVENTIL HFA;VENTOLIN HFA) 108 (90 BASE) MCG/ACT  inhaler Inhale 2 puffs into the lungs every 6 (six) hours as needed for wheezing or shortness of breath. 1 Inhaler 3  . Alum & Mag Hydroxide-Simeth (MAGIC MOUTHWASH W/LIDOCAINE) SOLN Take 5 mLs by mouth 3 (three) times daily as needed for mouth pain. 5 mL 0  . clotrimazole (LOTRIMIN) 1 % cream Apply 1 application topically 2 (two) times daily. 60 g 0  . dexamethasone (DECADRON) 4 MG tablet 2 tab po bid, the day before, day of and day after chemotherapy every 3 weeks 40  tablet 1  . diphenoxylate-atropine (LOMOTIL) 2.5-0.025 MG per tablet Take 2 tablets by mouth 4 (four) times daily as needed for diarrhea or loose stools. (Patient not taking: Reported on 10/16/2015) 30 tablet 0  . ELIQUIS 5 MG TABS tablet TAKE 1 TABLET (5 MG TOTAL) BY MOUTH 2 (TWO) TIMES DAILY. 60 tablet 11  . fluorometholone (FML) 0.1 % ophthalmic suspension Place 1 drop into both eyes 2 (two) times daily.  0  . folic acid (FOLVITE) 1 MG tablet TAKE 1 TABLET BY MOUTH EVERY DAY 30 tablet 4  . glipiZIDE (GLUCOTROL) 5 MG tablet Take 1 tablet (5 mg total) by mouth daily with breakfast. 10 tablet 0  . guaiFENesin (MUCINEX) 600 MG 12 hr tablet Take 600 mg by mouth 2 (two) times daily as needed for cough or to loosen phlegm.     . hydrocortisone (ANUSOL-HC) 25 MG suppository Place 1 suppository (25 mg total) rectally 2 (two) times daily. (Patient not taking: Reported on 10/16/2015) 12 suppository 0  . KLOR-CON M10 10 MEQ tablet Take 20 mEq by mouth daily as needed (with torsemide).   10  . levofloxacin (LEVAQUIN) 500 MG tablet Take 1 tablet (500 mg total) by mouth daily. 5 tablet 0  . lidocaine-prilocaine (EMLA) cream Apply 1 application topically as needed. 30 g 1  . loratadine (CLARITIN) 10 MG tablet Take 10 mg by mouth as directed. 3 days before chemo and 4 days after    . metoprolol tartrate (LOPRESSOR) 25 MG tablet TAKE 1 TABLET TWICE A DAY 60 tablet 3  . Multiple Vitamins-Minerals (ICAPS) CAPS Take 1 capsule by mouth 2 (two) times daily.     . polyethylene glycol (MIRALAX / GLYCOLAX) packet Take 8.5 g by mouth daily as needed for moderate constipation.     . saccharomyces boulardii (FLORASTOR) 250 MG capsule Take 1 capsule (250 mg total) by mouth 2 (two) times daily. 30 capsule 0  . sucralfate (CARAFATE) 1 GM/10ML suspension Take 10 mLs (1 g total) by mouth 4 (four) times daily -  with meals and at bedtime. 420 mL 0  . torsemide (DEMADEX) 20 MG tablet Take 20 mg by mouth 2 (two) times daily. Takes  one tablet daily and if swelling continues he takes a second pill    . traMADol (ULTRAM) 50 MG tablet Take 1 tablet (50 mg total) by mouth every 6 (six) hours as needed. (Patient taking differently: Take 50 mg by mouth at bedtime. ) 45 tablet 0   No current facility-administered medications for this visit.   Facility-Administered Medications Ordered in Other Visits  Medication Dose Route Frequency Provider Last Rate Last Dose  . sodium chloride 0.9 % injection 10 mL  10 mL Intravenous PRN Curt Bears, MD   10 mL at 10/31/15 0957    SURGICAL HISTORY:  Past Surgical History  Procedure Laterality Date  . Colonoscopy  2004  . Colonoscopy  2014    tubular adenoma x1, mod diverticulosis ([place])  .  Flexible bronchoscopy  02/2014    WNL  . Video bronchoscopy Bilateral 03/17/2014    Procedure: VIDEO BRONCHOSCOPY WITH FLUORO;  Surgeon: Tanda Rockers, MD;  Location: [facility];  Service: Cardiopulmonary;  Laterality: Bilateral;  . Chest tube insertion Left 08/26/2014    Procedure: INSERTION OF LEFT  PLEURAL DRAINAGE CATHETER;  Surgeon: Ivin Poot, MD;  Location: [place] [facility];  Service: Thoracic;  Laterality: Left;  . Cataract extraction Right 03/2015    Dr [name]  . Portacath placement Right 03/31/2015    Procedure: INSERTION PORT-A-CATH;  Surgeon: Ivin Poot, MD;  Location: [place];  Service: Thoracic;  Laterality: Right;  . Removal of pleural drainage catheter Left 03/31/2015    Procedure: REMOVAL OF PLEURAL DRAINAGE CATHETER;  Surgeon: Ivin Poot, MD;  Location: [place];  Service: Thoracic;  Laterality: Left;    REVIEW OF SYSTEMS:  Constitutional: positive for fatigue Eyes: left subconjunctival hemorrhage as above Ears, nose, mouth, throat, and face: negative Respiratory: positive for dyspnea on exertion Cardiovascular: negative Gastrointestinal: negative Genitourinary:negative Integument/breast: negative Hematologic/lymphatic: negative Musculoskeletal:negative Neurological:  negative Behavioral/Psych: negative Endocrine: negative Allergic/Immunologic: negative   PHYSICAL EXAMINATION: General appearance: alert, cooperative, fatigued and no distress Head: Normocephalic, without obvious abnormality, atraumatic, except for left subconjucntival hemorrhage Neck: no adenopathy, no JVD, supple, symmetrical, trachea midline and thyroid not enlarged, symmetric, no tenderness/mass/nodules Lymph nodes: Cervical, supraclavicular, and axillary nodes normal. Resp: diminished breath sounds LLL and dullness to percussion LLL Back: symmetric, no curvature. ROM normal. No CVA tenderness. Cardio: regular rate and rhythm, S1, S2 normal, no murmur, click, rub or gallop GI: soft, non-tender; bowel sounds normal; no masses,  no organomegaly Extremities: edema 1+ Neurologic: Alert and oriented X 3, normal strength and tone. Normal symmetric reflexes. Normal coordination and gait  ECOG PERFORMANCE STATUS: 1 - Symptomatic but completely ambulatory  There were no vitals taken for this visit. CBC Latest Ref Rng 10/31/2015 10/18/2015 10/18/2015  WBC 4.0 - 10.3 10e3/uL 8.5 11.9(H) 6.6  Hemoglobin 13.0 - 17.1 g/dL 10.1(L) 9.8(L) 7.3(L)  Hematocrit 38.4 - 49.9 % 31.6(L) 30.7(L) 23.0(L)  Platelets 140 - 400 10e3/uL 156 146(L) 88(L)     CMP Latest Ref Rng 10/18/2015 10/18/2015 10/17/2015  Glucose 65 - 99 mg/dL 61(L) 171(H) 49(L)  BUN 6 - 20 mg/dL 26(H) 27(H) 24(H)  Creatinine 0.61 - 1.24 mg/dL 1.22 1.35(H) 1.40(H)  Sodium 135 - 145 mmol/L 136 132(L) 136  Potassium 3.5 - 5.1 mmol/L 3.9 3.7 3.8  Chloride 101 - 111 mmol/L 101 101 101  CO2 22 - 32 mmol/L 23 25 -  Calcium 8.9 - 10.3 mg/dL 7.8(L) 7.2(L) -  Total Protein 6.5 - 8.1 g/dL 5.5(L) 4.0(L) -  Total Bilirubin 0.3 - 1.2 mg/dL 0.8 0.6 -  Alkaline Phos 38 - 126 U/L 64 45 -  AST 15 - 41 U/L 33 22 -  ALT 17 - 63 U/L 11(L) 8(L) -     RADIOGRAPHIC STUDIES: Dg Chest 2 View  10/18/2015  CLINICAL DATA:  Acute onset of cough and fever.   Initial encounter. EXAM: CHEST  2 VIEW COMPARISON:  Chest radiograph performed 07/31/2015, and CT of the chest performed 09/22/2015 FINDINGS: The lungs are well-aerated. A small left pleural effusion is noted. Mild left perihilar and left basilar airspace opacities may reflect pneumonia, given the patient's symptoms. The right lung appears relatively clear. No pneumothorax is seen. The heart is borderline normal in size. A right-sided chest port is noted ending about the distal SVC. No acute osseous  abnormalities are seen. IMPRESSION: Small left pleural effusion noted. Mild left perihilar and left basilar airspace opacities raise concern for pneumonia, given the patient's symptoms. Electronically Signed   By: Garald Balding M.D.   On: 10/18/2015 00:10   ASSESSMENT AND PLAN: this is a very pleasant 74 years old white male with:  1) Stage IV non-small cell lung cancer, adenocarcinoma presenting with large right upper lobe lung mass in addition to mediastinal and bilateral hilar lymphadenopathy as well as cervical lymphadenopathy and left pleural effusion. The patient completed a course systemic chemotherapy with carboplatin and Alimta status post 6 cycles. This was followed by 3 cycles of maintenance chemotherapy with single agent Alimta. The patient tolerated his treatment well. He had evidence for disease progression and the patient was started on treatment again with carboplatin and Alimta status post 6 cycles. He was treated with 4 cycles of immunotherapy with Nivolumab discontinued secondary to disease progression. The patient is currently on systemic chemotherapy with docetaxel and Cyramza status post 4 cycles and tolerated the treatment well except for neutropenia and fatigue. His neutropenia had improved after I reduce the dose of docetaxel to 60 MG/M2 with continuous Neulasta support.  The most recent CT scan of the chest, abdomen and pelvis showed interval decrease/resolution of pulmonary nodules  with no other evidence of disease progression. He is ready to proceed with cycle #5  2) Atrial fibrillation: continue Eliquis.  3) lower extremity edema: Improved.   4) CODE STATUS: Discussed again with the patient and his wife. He would like initial resuscitation but no prolonged support for vegetative state.  5. Subjunctival hemorrhage The patient developed hemorrhage in the left scleral area, without any vision changes. He is to follow-up with his ophthalmologist. No other bleeding issues are reported. This may be likely related to forceful coughing, however, chemotherapy may have played a role of extravasation in the area.   The patient would come back for follow-up for the next cycle for reevaluation and management of any adverse effect of his treatment before starting cycle #6. He was advised to call immediately if he has any concerning symptoms in the interval. We will repeat scans after one more cycle of chemotherapy.    The patient voices understanding of current disease status and treatment options and is in agreement with the current care plan.  All questions were answered. The patient knows to call the clinic with any problems, questions or concerns. We can certainly see the patient much sooner if necessary.  ADDENDUM: Hematology/Oncology Attending: I had a face to face encounter with the patient. I recommended his care plan. This is a very pleasant 74 years old white male with metastatic non-small cell lung cancer, adenocarcinoma was currently undergoing systemic chemotherapy with docetaxel and Cyramza status post 4 cycles and has been tolerating his treatment lately much better. The patient has some conjunctival hemorrhage on the left eye earlier today. He will get an appointment with his ophthalmologist for evaluation later today. I recommended for the patient to continue on his current treatment and he will start cycle #5 today as a scheduled. He would come back for  follow-up visit in 3 weeks for reevaluation before starting cycle #6. The patient was advised to call if he has any other concerning symptoms in the interval.  Disclaimer: This note was dictated with voice recognition software. Similar sounding words can inadvertently be transcribed and may be missed upon review. Eilleen Kempf., MD 10/31/2015

## 2015-10-31 NOTE — Telephone Encounter (Signed)
Gave and printed new sched with [facility] appt for lab

## 2015-10-31 NOTE — Telephone Encounter (Signed)
Does he have good UOP with '20mg'$  torsemide? Ok to continue torsemide '20mg'$  BID. Update if swelling not improving with this dosing.

## 2015-10-31 NOTE — Patient Instructions (Signed)

## 2015-10-31 NOTE — Telephone Encounter (Signed)
Gave and printed appt sched adn avs for pt for NOV and DEC

## 2015-10-31 NOTE — Telephone Encounter (Signed)
Gave and printd appt sched and avs for pt for NOV and DEC.Marland KitchenMarland KitchenMarland Kitchen

## 2015-10-31 NOTE — Patient Instructions (Signed)
[facility] Discharge Instructions for Patients Receiving Chemotherapy  Today you received the following chemotherapy agents cyramza, taxotere  To help prevent nausea and vomiting after your treatment, we encourage you to take your nausea medication   If you develop nausea and vomiting that is not controlled by your nausea medication, call the clinic.   BELOW ARE SYMPTOMS THAT SHOULD BE REPORTED IMMEDIATELY:  *FEVER GREATER THAN 100.5 F  *CHILLS WITH OR WITHOUT FEVER  NAUSEA AND VOMITING THAT IS NOT CONTROLLED WITH YOUR NAUSEA MEDICATION  *UNUSUAL SHORTNESS OF BREATH  *UNUSUAL BRUISING OR BLEEDING  TENDERNESS IN MOUTH AND THROAT WITH OR WITHOUT PRESENCE OF ULCERS  *URINARY PROBLEMS  *BOWEL PROBLEMS  UNUSUAL RASH Items with * indicate a potential emergency and should be followed up as soon as possible.  Feel free to call the clinic you have any questions or concerns. The clinic phone number is [phone].  Please show the [place] at check-in to the Emergency Department and triage nurse.

## 2015-10-31 NOTE — Telephone Encounter (Signed)
Spoke with patient's wife. She said his UOP is the same as it's been. She will have him continue the BID dosing and will call if no improvement.

## 2015-10-31 NOTE — Telephone Encounter (Signed)
Kittie pt wife (DPR signed) left v/m; pt was seen 10/23/15; now pt having problem with fluid retention, feet and legs are swollen and stomach is swollen as well; pt always has SOB, and Kittie thinks SOB is the same as usual. No chest pain.Kittie wants to know if pt can take a 2nd torsemide today. Pt has f/u appt with Dr Darnell Level on 11/06/15. Advised Kittie from 10/23/15 office visit pt is to take torsemide 20 mg bid. Kittie voiced understanding and will cb if swelling does not improve. To Dr Darnell Level as Juluis Rainier and any further instructions.

## 2015-11-01 ENCOUNTER — Ambulatory Visit (HOSPITAL_BASED_OUTPATIENT_CLINIC_OR_DEPARTMENT_OTHER): Payer: Medicare Other

## 2015-11-01 VITALS — BP 144/97 | HR 90 | Temp 97.9°F | Resp 20

## 2015-11-01 DIAGNOSIS — D709 Neutropenia, unspecified: Secondary | ICD-10-CM

## 2015-11-01 DIAGNOSIS — C3492 Malignant neoplasm of unspecified part of left bronchus or lung: Secondary | ICD-10-CM

## 2015-11-01 MED ORDER — PEGFILGRASTIM INJECTION 6 MG/0.6ML ~~LOC~~
6.0000 mg | PREFILLED_SYRINGE | Freq: Once | SUBCUTANEOUS | Status: AC
  Administered 2015-11-01: 6 mg via SUBCUTANEOUS
  Filled 2015-11-01: qty 0.6

## 2015-11-02 DIAGNOSIS — H04123 Dry eye syndrome of bilateral lacrimal glands: Secondary | ICD-10-CM | POA: Diagnosis not present

## 2015-11-02 DIAGNOSIS — M3501 Sicca syndrome with keratoconjunctivitis: Secondary | ICD-10-CM | POA: Diagnosis not present

## 2015-11-02 DIAGNOSIS — H02051 Trichiasis without entropian right upper eyelid: Secondary | ICD-10-CM | POA: Diagnosis not present

## 2015-11-06 ENCOUNTER — Encounter: Payer: Self-pay | Admitting: Family Medicine

## 2015-11-06 ENCOUNTER — Ambulatory Visit (INDEPENDENT_AMBULATORY_CARE_PROVIDER_SITE_OTHER): Payer: Medicare Other | Admitting: Family Medicine

## 2015-11-06 VITALS — BP 110/70 | HR 80 | Temp 97.4°F | Wt 182.5 lb

## 2015-11-06 DIAGNOSIS — T380X5A Adverse effect of glucocorticoids and synthetic analogues, initial encounter: Secondary | ICD-10-CM

## 2015-11-06 DIAGNOSIS — E099 Drug or chemical induced diabetes mellitus without complications: Secondary | ICD-10-CM | POA: Diagnosis not present

## 2015-11-06 DIAGNOSIS — R609 Edema, unspecified: Secondary | ICD-10-CM

## 2015-11-06 DIAGNOSIS — R0609 Other forms of dyspnea: Secondary | ICD-10-CM | POA: Diagnosis not present

## 2015-11-06 DIAGNOSIS — J181 Lobar pneumonia, unspecified organism: Secondary | ICD-10-CM

## 2015-11-06 DIAGNOSIS — C3492 Malignant neoplasm of unspecified part of left bronchus or lung: Secondary | ICD-10-CM

## 2015-11-06 DIAGNOSIS — R600 Localized edema: Secondary | ICD-10-CM

## 2015-11-06 DIAGNOSIS — I4891 Unspecified atrial fibrillation: Secondary | ICD-10-CM | POA: Diagnosis not present

## 2015-11-06 DIAGNOSIS — E8809 Other disorders of plasma-protein metabolism, not elsewhere classified: Secondary | ICD-10-CM

## 2015-11-06 DIAGNOSIS — I499 Cardiac arrhythmia, unspecified: Secondary | ICD-10-CM

## 2015-11-06 DIAGNOSIS — E46 Unspecified protein-calorie malnutrition: Secondary | ICD-10-CM

## 2015-11-06 NOTE — Assessment & Plan Note (Addendum)
Recheck EKG today. Recent EKGs sinus rhythm. ?dyspnea etiology. Reviewed echo 02/2015. Continue eliquis, metoprolol '25mg'$  bid. Has not seen cards in the past - will refer today for further recs for afib.  EKG - diffuse T wave flattening, atrial fibrillation without clear P waves

## 2015-11-06 NOTE — Assessment & Plan Note (Addendum)
Recent stable readings. Continue glipizide '5mg'$  with breakfast on decadron days.  Lab Results  Component Value Date   HGBA1C 4.9 10/18/2015

## 2015-11-06 NOTE — Assessment & Plan Note (Signed)
?  chemotherapy related as it seems to correlate with chemo cycle, vs afib related - check EKG today.

## 2015-11-06 NOTE — Assessment & Plan Note (Addendum)
Await albumin results pending tomorrow. Pt taking glucerna daily.

## 2015-11-06 NOTE — Assessment & Plan Note (Addendum)
RTC 1 wk prevnar. Pt states ok by onc.

## 2015-11-06 NOTE — Assessment & Plan Note (Signed)
Pt doing better with regular glucerna use.

## 2015-11-06 NOTE — Assessment & Plan Note (Signed)
Increase tosemide to '20mg'$  bid.

## 2015-11-06 NOTE — Progress Notes (Addendum)
BP 110/70 mmHg  Pulse 80  Temp(Src) 97.4 F (36.3 C) (Oral)  Wt 182 lb 8 oz (82.781 kg)   CC: f/u visit  Subjective:    Patient ID: Shawn Espinoza, male    DOB: 01-30-1941, 74 y.o.   MRN: [id number]  HPI: Shawn Espinoza is a 74 y.o. male presenting on 11/06/2015 for Follow-up   Known stage IV lung cancer dx 02/2014, on chemo docetaxel and cyramza Q3 wks + neulasta.   See prior note for details - recent hospital admission for hypoglycemia and pneumonia with sepsis. Treated with rocephin/azithro/levaquin. On chronic 2L by [place].   Now on glipizide 38m daily with breakfast PRN on decadron days. cbg fasting this morning 171. Usually 130-135. No sugars >200 off steroids. On steroids up to 300s. Doing well on glucerna.   Back on torsemide 267mQD - could not tolerate BID dosing. Noticing swelling at waist. Persistent fluctuating dyspnea on exertion - notices this cycles along with chemotherapy sessions. Staying very cold - despite layering, blankets, and heater at 80. Ongoing for several months. Endorses chills that knock him out.   Advised ok to get pneumonia shots.   Relevant past medical, surgical, family and social history reviewed and updated as indicated. Interim medical history since our last visit reviewed. Allergies and medications reviewed and updated. Current Outpatient Prescriptions on File Prior to Visit  Medication Sig  . albuterol (PROVENTIL HFA;VENTOLIN HFA) 108 (90 BASE) MCG/ACT inhaler Inhale 2 puffs into the lungs every 6 (six) hours as needed for wheezing or shortness of breath.  . Alum & Mag Hydroxide-Simeth (MAGIC MOUTHWASH W/LIDOCAINE) SOLN Take 5 mLs by mouth 3 (three) times daily as needed for mouth pain.  . clotrimazole (LOTRIMIN) 1 % cream Apply 1 application topically 2 (two) times daily.  . Marland Kitchenexamethasone (DECADRON) 4 MG tablet 2 tab po bid, the day before, day of and day after chemotherapy every 3 weeks  . ELIQUIS 5 MG TABS tablet TAKE 1 TABLET (5 MG  TOTAL) BY MOUTH 2 (TWO) TIMES DAILY.  . fluorometholone (FML) 0.1 % ophthalmic suspension Place 1 drop into both eyes 2 (two) times daily.  . folic acid (FOLVITE) 1 MG tablet TAKE 1 TABLET BY MOUTH EVERY DAY  . glipiZIDE (GLUCOTROL) 5 MG tablet Take 1 tablet (5 mg total) by mouth daily with breakfast.  . guaiFENesin (MUCINEX) 600 MG 12 hr tablet Take 600 mg by mouth 2 (two) times daily as needed for cough or to loosen phlegm.   . Marland KitchenLOR-CON M10 10 MEQ tablet Take 20 mEq by mouth daily as needed (with torsemide).   . Marland Kitchenidocaine-prilocaine (EMLA) cream Apply 1 application topically as needed.  . loratadine (CLARITIN) 10 MG tablet Take 10 mg by mouth as directed. 3 days before chemo and 4 days after  . metoprolol tartrate (LOPRESSOR) 25 MG tablet TAKE 1 TABLET TWICE A DAY  . Multiple Vitamins-Minerals (ICAPS) CAPS Take 1 capsule by mouth 2 (two) times daily.   . polyethylene glycol (MIRALAX / GLYCOLAX) packet Take 8.5 g by mouth daily as needed for moderate constipation.   . saccharomyces boulardii (FLORASTOR) 250 MG capsule Take 1 capsule (250 mg total) by mouth 2 (two) times daily.  . sucralfate (CARAFATE) 1 GM/10ML suspension Take 10 mLs (1 g total) by mouth 4 (four) times daily -  with meals and at bedtime.  . torsemide (DEMADEX) 20 MG tablet Take 20 mg by mouth 2 (two) times daily. Takes one tablet daily and if swelling continues  he takes a second pill  . traMADol (ULTRAM) 50 MG tablet Take 1 tablet (50 mg total) by mouth every 6 (six) hours as needed. (Patient taking differently: Take 50 mg by mouth at bedtime. )  . diphenoxylate-atropine (LOMOTIL) 2.5-0.025 MG per tablet Take 2 tablets by mouth 4 (four) times daily as needed for diarrhea or loose stools. (Patient not taking: Reported on 10/16/2015)  . hydrocortisone (ANUSOL-HC) 25 MG suppository Place 1 suppository (25 mg total) rectally 2 (two) times daily. (Patient not taking: Reported on 10/16/2015)   No current facility-administered  medications on file prior to visit.    Review of Systems Per HPI unless specifically indicated in ROS section     Objective:    BP 110/70 mmHg  Pulse 80  Temp(Src) 97.4 F (36.3 C) (Oral)  Wt 182 lb 8 oz (82.781 kg)  Wt Readings from Last 3 Encounters:  11/06/15 182 lb 8 oz (82.781 kg)  10/31/15 184 lb 3.2 oz (83.553 kg)  10/23/15 183 lb 12 oz (83.348 kg)    Physical Exam  Constitutional: He appears well-developed and well-nourished. No distress.  Walks with cane  HENT:  Mouth/Throat: Oropharynx is clear and moist. No oropharyngeal exudate.  Cardiovascular: Normal heart sounds and intact distal pulses.  An irregularly irregular rhythm present. Tachycardia present.   No murmur heard. Pulmonary/Chest: Effort normal and breath sounds normal. No respiratory distress. He has no wheezes. He has no rales.  Abdominal: Soft. Bowel sounds are normal. He exhibits no distension and no mass. There is no tenderness. There is no rebound and no guarding.  Mild pitting edema lower abdomen  Musculoskeletal: He exhibits edema.  Woody pedal edema. Able to get on exam table without assistance.  Skin: Skin is warm and dry. No erythema.  Psychiatric: He has a normal mood and affect.  Nursing note and vitals reviewed.  Results for orders placed or performed in visit on 10/31/15  CBC with Differential  Result Value Ref Range   WBC 8.5 4.0 - 10.3 10e3/uL   NEUT# 8.0 (H) 1.5 - 6.5 10e3/uL   HGB 10.1 (L) 13.0 - 17.1 g/dL   HCT 31.6 (L) 38.4 - 49.9 %   Platelets 156 140 - 400 10e3/uL   MCV 90.1 79.3 - 98.0 fL   MCH 28.7 27.2 - 33.4 pg   MCHC 31.8 (L) 32.0 - 36.0 g/dL   RBC 3.51 (L) 4.20 - 5.82 10e6/uL   RDW 17.1 (H) 11.0 - 14.6 %   lymph# 0.5 (L) 0.9 - 3.3 10e3/uL   MONO# 0.1 0.1 - 0.9 10e3/uL   Eosinophils Absolute 0.0 0.0 - 0.5 10e3/uL   Basophils Absolute 0.0 0.0 - 0.1 10e3/uL   NEUT% 93.7 (H) 39.0 - 75.0 %   LYMPH% 5.3 (L) 14.0 - 49.0 %   MONO% 0.9 0.0 - 14.0 %   EOS% 0.0 0.0 - 7.0 %     BASO% 0.1 0.0 - 2.0 %  TSH-Thyroid Stimulating Hormone  Result Value Ref Range   TSH 1.723 0.320 - 4.118 m(IU)/L  UA Protein, Dipstick - [facility]  Result Value Ref Range   Protein, ur Negative Negative- <30 mg/dL  Comprehensive metabolic panel  Result Value Ref Range   Sodium 141 136 - 145 mEq/L   Potassium 4.4 3.5 - 5.1 mEq/L   Chloride 106 98 - 109 mEq/L   CO2 24 22 - 29 mEq/L   Glucose 244 (H) 70 - 140 mg/dl   BUN 18.2 7.0 - 26.0 mg/dL  Creatinine 0.8 0.7 - 1.3 mg/dL   Total Bilirubin 0.32 0.20 - 1.20 mg/dL   Alkaline Phosphatase 71 40 - 150 U/L   AST 8 5 - 34 U/L   ALT <9 0 - 55 U/L   Total Protein 5.3 (L) 6.4 - 8.3 g/dL   Albumin 2.5 (L) 3.5 - 5.0 g/dL   Calcium 8.8 8.4 - 10.4 mg/dL   Anion Gap 11 3 - 11 mEq/L   EGFR 87 (L) >90 ml/min/1.73 m2  Magnesium  Result Value Ref Range   Magnesium 1.8 1.5 - 2.5 mg/dl      Assessment & Plan:   Problem List Items Addressed This Visit    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 Continue glipizide 64m with breakfast on decadron days.  Lab Results  Component Value Date   HGBA1C 4.9 10/18/2015         Protein-calorie malnutrition ([facility]    Pt doing better with regular glucerna use.      Peripheral edema    Increase tosemide to 247mbid.      Lobar pneumonia ([facility]   RTC 1 wk prevnar. Pt states ok by onc.      Hypoalbuminemia due to protein-calorie malnutrition ([place]   Await albumin results pending tomorrow. Pt taking glucerna daily.      DOE (dyspnea on exertion)    ?chemotherapy related as it seems to correlate with chemo cycle, vs afib related - check EKG today.      Relevant Orders   Ambulatory referral to Cardiology   Atrial fibrillation, unspecified - Primary    Recheck EKG today. Recent EKGs sinus rhythm. ?dyspnea etiology. Reviewed echo 02/2015. Continue eliquis, metoprolol 2536mid. Has not seen cards in the past - will refer today for further recs for afib.  EKG - diffuse T wave  flattening, atrial fibrillation without clear P waves      Relevant Orders   Ambulatory referral to Cardiology   Adenocarcinoma of left lung, stage 4 ([facility]  Other Visit Diagnoses    Irregular heart beat        Relevant Orders    EKG 12-Lead (Completed)        Follow up plan: Return in about 4 weeks (around 12/04/2015), or as needed, for follow up visit.

## 2015-11-06 NOTE — Progress Notes (Signed)
Pre visit review using our clinic review tool, if applicable. No additional management support is needed unless otherwise documented below in the visit note. 

## 2015-11-06 NOTE — Patient Instructions (Addendum)
Return in 1 week for prevnar (nurse visit).  Increase torsemide to '20mg'$  twice daily.  EKG today for irregular heart.  Return to see me in 1 month.

## 2015-11-07 ENCOUNTER — Ambulatory Visit: Payer: Medicare Other

## 2015-11-07 ENCOUNTER — Other Ambulatory Visit (HOSPITAL_BASED_OUTPATIENT_CLINIC_OR_DEPARTMENT_OTHER): Payer: Medicare Other

## 2015-11-07 ENCOUNTER — Ambulatory Visit: Payer: Medicare Other | Admitting: Internal Medicine

## 2015-11-07 ENCOUNTER — Other Ambulatory Visit: Payer: Medicare Other

## 2015-11-07 DIAGNOSIS — C3492 Malignant neoplasm of unspecified part of left bronchus or lung: Secondary | ICD-10-CM

## 2015-11-07 DIAGNOSIS — C3411 Malignant neoplasm of upper lobe, right bronchus or lung: Secondary | ICD-10-CM

## 2015-11-07 LAB — CBC WITH DIFFERENTIAL/PLATELET
BASO%: 0 % (ref 0.0–2.0)
BASOS ABS: 0 10*3/uL (ref 0.0–0.1)
EOS%: 1 % (ref 0.0–7.0)
Eosinophils Absolute: 0 10*3/uL (ref 0.0–0.5)
HEMATOCRIT: 29.9 % — AB (ref 38.4–49.9)
HEMOGLOBIN: 9.4 g/dL — AB (ref 13.0–17.1)
LYMPH#: 0.7 10*3/uL — AB (ref 0.9–3.3)
LYMPH%: 33.2 % (ref 14.0–49.0)
MCH: 28.4 pg (ref 27.2–33.4)
MCHC: 31.4 g/dL — ABNORMAL LOW (ref 32.0–36.0)
MCV: 90.3 fL (ref 79.3–98.0)
MONO#: 0.3 10*3/uL (ref 0.1–0.9)
MONO%: 12.7 % (ref 0.0–14.0)
NEUT%: 53.1 % (ref 39.0–75.0)
NEUTROS ABS: 1.1 10*3/uL — AB (ref 1.5–6.5)
Platelets: 49 10*3/uL — ABNORMAL LOW (ref 140–400)
RBC: 3.31 10*6/uL — ABNORMAL LOW (ref 4.20–5.82)
RDW: 15.5 % — AB (ref 11.0–14.6)
WBC: 2.1 10*3/uL — AB (ref 4.0–10.3)

## 2015-11-07 LAB — COMPREHENSIVE METABOLIC PANEL (CC13)
ALBUMIN: 2.4 g/dL — AB (ref 3.5–5.0)
ALK PHOS: 66 U/L (ref 40–150)
ALT: 13 U/L (ref 0–55)
AST: 11 U/L (ref 5–34)
Anion Gap: 9 mEq/L (ref 3–11)
BILIRUBIN TOTAL: 0.72 mg/dL (ref 0.20–1.20)
BUN: 17.3 mg/dL (ref 7.0–26.0)
CALCIUM: 8.1 mg/dL — AB (ref 8.4–10.4)
CO2: 27 mEq/L (ref 22–29)
CREATININE: 0.8 mg/dL (ref 0.7–1.3)
Chloride: 103 mEq/L (ref 98–109)
EGFR: 89 mL/min/{1.73_m2} — ABNORMAL LOW (ref >90)
GLUCOSE: 186 mg/dL — AB (ref 70–140)
POTASSIUM: 4.1 meq/L (ref 3.5–5.1)
SODIUM: 139 meq/L (ref 136–145)
TOTAL PROTEIN: 5 g/dL — AB (ref 6.4–8.3)

## 2015-11-09 ENCOUNTER — Ambulatory Visit: Payer: Medicare Other

## 2015-11-09 ENCOUNTER — Ambulatory Visit: Payer: Medicare Other | Admitting: Podiatry

## 2015-11-09 DIAGNOSIS — L720 Epidermal cyst: Secondary | ICD-10-CM | POA: Diagnosis not present

## 2015-11-09 NOTE — Addendum Note (Signed)
Addended by: [name] on: 11/09/2015 08:37 AM   Modules accepted: Orders

## 2015-11-10 ENCOUNTER — Encounter: Payer: Self-pay | Admitting: Podiatry

## 2015-11-10 ENCOUNTER — Ambulatory Visit (INDEPENDENT_AMBULATORY_CARE_PROVIDER_SITE_OTHER): Payer: Medicare Other | Admitting: Podiatry

## 2015-11-10 DIAGNOSIS — M79676 Pain in unspecified toe(s): Secondary | ICD-10-CM

## 2015-11-10 DIAGNOSIS — E1149 Type 2 diabetes mellitus with other diabetic neurological complication: Secondary | ICD-10-CM

## 2015-11-10 DIAGNOSIS — B351 Tinea unguium: Secondary | ICD-10-CM

## 2015-11-10 NOTE — Progress Notes (Signed)
Patient ID: Shawn Espinoza, male   DOB: [birth year], 74 y.o.   MRN: [id number] Complaint:  Visit Type: Patient returns to my office for continued preventative foot care services. Complaint: Patient states" my nails have grown long and thick and become painful to walk and wear shoes" Patient has been diagnosed with DM with no foot complications. The patient presents for preventative foot care services. No changes to ROS  Podiatric Exam: Vascular: dorsalis pedis and posterior tibial pulses are not  palpable bilateral. Capillary return is immediate. Cold feet noted.  Sensorium: Diminished Semmes Weinstein monofilament test. Normal tactile sensation bilaterally. Nail Exam: Pt has thick disfigured discolored nails with subungual debris noted bilateral entire nail hallux through fifth toenails Ulcer Exam: There is no evidence of ulcer or pre-ulcerative changes or infection. Orthopedic Exam: Muscle tone and strength are WNL. No limitations in general ROM. No crepitus or effusions noted. Foot type and digits show no abnormalities. Bony prominences are unremarkable. Skin: No Porokeratosis. No infection or ulcers  Diagnosis:  Onychomycosis, , Pain in right toe, pain in left toes  Treatment & Plan Procedures and Treatment: Consent by patient was obtained for treatment procedures. The patient understood the discussion of treatment and procedures well. All questions were answered thoroughly reviewed. Debridement of mycotic and hypertrophic toenails, 1 through 5 bilateral and clearing of subungual debris. No ulceration, no infection noted.  Return Visit-Office Procedure: Patient instructed to return to the office for a follow up visit 3 months for continued evaluation and treatment. To reschedule in [place] in 3 months.    Gardiner Barefoot DPM

## 2015-11-14 ENCOUNTER — Other Ambulatory Visit (HOSPITAL_BASED_OUTPATIENT_CLINIC_OR_DEPARTMENT_OTHER): Payer: Medicare Other

## 2015-11-14 DIAGNOSIS — C3492 Malignant neoplasm of unspecified part of left bronchus or lung: Secondary | ICD-10-CM

## 2015-11-14 LAB — COMPREHENSIVE METABOLIC PANEL (CC13)
ALT: 11 U/L (ref 0–55)
AST: 19 U/L (ref 5–34)
Albumin: 2.6 g/dL — ABNORMAL LOW (ref 3.5–5.0)
Alkaline Phosphatase: 87 U/L (ref 40–150)
Anion Gap: 8 mEq/L (ref 3–11)
BILIRUBIN TOTAL: 0.7 mg/dL (ref 0.20–1.20)
BUN: 8.7 mg/dL (ref 7.0–26.0)
CO2: 28 meq/L (ref 22–29)
Calcium: 8.4 mg/dL (ref 8.4–10.4)
Chloride: 105 mEq/L (ref 98–109)
Creatinine: 0.8 mg/dL (ref 0.7–1.3)
EGFR: 87 mL/min/{1.73_m2} — AB (ref >90)
GLUCOSE: 177 mg/dL — AB (ref 70–140)
Potassium: 3.9 mEq/L (ref 3.5–5.1)
SODIUM: 142 meq/L (ref 136–145)
TOTAL PROTEIN: 5.4 g/dL — AB (ref 6.4–8.3)

## 2015-11-14 LAB — CBC WITH DIFFERENTIAL/PLATELET
BASO%: 1.3 % (ref 0.0–2.0)
Basophils Absolute: 0.1 10*3/uL (ref 0.0–0.1)
EOS ABS: 0 10*3/uL (ref 0.0–0.5)
EOS%: 0.1 % (ref 0.0–7.0)
HCT: 36 % — ABNORMAL LOW (ref 38.4–49.9)
HGB: 11.2 g/dL — ABNORMAL LOW (ref 13.0–17.1)
LYMPH%: 12.2 % — AB (ref 14.0–49.0)
MCH: 28.3 pg (ref 27.2–33.4)
MCHC: 31.1 g/dL — ABNORMAL LOW (ref 32.0–36.0)
MCV: 90.9 fL (ref 79.3–98.0)
MONO#: 0.3 10*3/uL (ref 0.1–0.9)
MONO%: 3.5 % (ref 0.0–14.0)
NEUT%: 82.9 % — ABNORMAL HIGH (ref 39.0–75.0)
NEUTROS ABS: 7.2 10*3/uL — AB (ref 1.5–6.5)
Platelets: 125 10*3/uL — ABNORMAL LOW (ref 140–400)
RBC: 3.96 10*6/uL — AB (ref 4.20–5.82)
RDW: 18 % — ABNORMAL HIGH (ref 11.0–14.6)
WBC: 8.7 10*3/uL (ref 4.0–10.3)
lymph#: 1.1 10*3/uL (ref 0.9–3.3)

## 2015-11-15 ENCOUNTER — Ambulatory Visit (INDEPENDENT_AMBULATORY_CARE_PROVIDER_SITE_OTHER): Payer: Medicare Other

## 2015-11-15 DIAGNOSIS — Z23 Encounter for immunization: Secondary | ICD-10-CM | POA: Diagnosis not present

## 2015-11-21 ENCOUNTER — Other Ambulatory Visit (HOSPITAL_BASED_OUTPATIENT_CLINIC_OR_DEPARTMENT_OTHER): Payer: Medicare Other

## 2015-11-21 ENCOUNTER — Ambulatory Visit (HOSPITAL_BASED_OUTPATIENT_CLINIC_OR_DEPARTMENT_OTHER): Payer: Medicare Other | Admitting: Internal Medicine

## 2015-11-21 ENCOUNTER — Ambulatory Visit (HOSPITAL_BASED_OUTPATIENT_CLINIC_OR_DEPARTMENT_OTHER): Payer: Medicare Other

## 2015-11-21 ENCOUNTER — Encounter: Payer: Self-pay | Admitting: Internal Medicine

## 2015-11-21 ENCOUNTER — Telehealth: Payer: Self-pay | Admitting: Internal Medicine

## 2015-11-21 ENCOUNTER — Ambulatory Visit: Payer: Medicare Other

## 2015-11-21 ENCOUNTER — Ambulatory Visit: Payer: Medicare Other | Admitting: Cardiovascular Disease

## 2015-11-21 VITALS — BP 132/72 | HR 86 | Temp 97.5°F | Resp 18 | Ht 69.0 in | Wt 179.5 lb

## 2015-11-21 DIAGNOSIS — R0609 Other forms of dyspnea: Secondary | ICD-10-CM

## 2015-11-21 DIAGNOSIS — Z95828 Presence of other vascular implants and grafts: Secondary | ICD-10-CM

## 2015-11-21 DIAGNOSIS — I4891 Unspecified atrial fibrillation: Secondary | ICD-10-CM | POA: Diagnosis not present

## 2015-11-21 DIAGNOSIS — C3411 Malignant neoplasm of upper lobe, right bronchus or lung: Secondary | ICD-10-CM | POA: Diagnosis not present

## 2015-11-21 DIAGNOSIS — C3492 Malignant neoplasm of unspecified part of left bronchus or lung: Secondary | ICD-10-CM

## 2015-11-21 DIAGNOSIS — Z5111 Encounter for antineoplastic chemotherapy: Secondary | ICD-10-CM | POA: Diagnosis not present

## 2015-11-21 DIAGNOSIS — Z5112 Encounter for antineoplastic immunotherapy: Secondary | ICD-10-CM

## 2015-11-21 DIAGNOSIS — R05 Cough: Secondary | ICD-10-CM

## 2015-11-21 DIAGNOSIS — R5383 Other fatigue: Secondary | ICD-10-CM

## 2015-11-21 DIAGNOSIS — R530 Neoplastic (malignant) related fatigue: Secondary | ICD-10-CM

## 2015-11-21 LAB — CBC WITH DIFFERENTIAL/PLATELET
BASO%: 0.2 % (ref 0.0–2.0)
BASOS ABS: 0 10*3/uL (ref 0.0–0.1)
EOS ABS: 0 10*3/uL (ref 0.0–0.5)
EOS%: 0 % (ref 0.0–7.0)
HEMATOCRIT: 36 % — AB (ref 38.4–49.9)
HEMOGLOBIN: 11.2 g/dL — AB (ref 13.0–17.1)
LYMPH%: 4.5 % — ABNORMAL LOW (ref 14.0–49.0)
MCH: 28 pg (ref 27.2–33.4)
MCHC: 31 g/dL — ABNORMAL LOW (ref 32.0–36.0)
MCV: 90.3 fL (ref 79.3–98.0)
MONO#: 0.2 10*3/uL (ref 0.1–0.9)
MONO%: 1.6 % (ref 0.0–14.0)
NEUT#: 11.2 10*3/uL — ABNORMAL HIGH (ref 1.5–6.5)
NEUT%: 93.7 % — ABNORMAL HIGH (ref 39.0–75.0)
PLATELETS: 148 10*3/uL (ref 140–400)
RBC: 3.99 10*6/uL — ABNORMAL LOW (ref 4.20–5.82)
RDW: 18.9 % — AB (ref 11.0–14.6)
WBC: 11.9 10*3/uL — ABNORMAL HIGH (ref 4.0–10.3)
lymph#: 0.5 10*3/uL — ABNORMAL LOW (ref 0.9–3.3)

## 2015-11-21 LAB — COMPREHENSIVE METABOLIC PANEL (CC13)
ALBUMIN: 2.8 g/dL — AB (ref 3.5–5.0)
ALK PHOS: 73 U/L (ref 40–150)
ALT: <9 U/L (ref 0–55)
ANION GAP: 10 meq/L (ref 3–11)
AST: 10 U/L (ref 5–34)
BUN: 19.1 mg/dL (ref 7.0–26.0)
CALCIUM: 8.6 mg/dL (ref 8.4–10.4)
CO2: 25 mEq/L (ref 22–29)
Chloride: 106 mEq/L (ref 98–109)
Creatinine: 0.9 mg/dL (ref 0.7–1.3)
EGFR: 79 mL/min/{1.73_m2} — AB (ref >90)
Glucose: 194 mg/dl — ABNORMAL HIGH (ref 70–140)
POTASSIUM: 4.4 meq/L (ref 3.5–5.1)
SODIUM: 141 meq/L (ref 136–145)
Total Bilirubin: 0.44 mg/dL (ref 0.20–1.20)
Total Protein: 5.5 g/dL — ABNORMAL LOW (ref 6.4–8.3)

## 2015-11-21 LAB — CULTURE, RESPIRATORY W GRAM STAIN

## 2015-11-21 LAB — CULTURE, RESPIRATORY: CULTURE: NORMAL

## 2015-11-21 MED ORDER — ACETAMINOPHEN 325 MG PO TABS
ORAL_TABLET | ORAL | Status: AC
  Filled 2015-11-21: qty 2

## 2015-11-21 MED ORDER — DOCETAXEL CHEMO INJECTION 160 MG/16ML
60.0000 mg/m2 | Freq: Once | INTRAVENOUS | Status: AC
  Administered 2015-11-21: 120 mg via INTRAVENOUS
  Filled 2015-11-21: qty 12

## 2015-11-21 MED ORDER — SODIUM CHLORIDE 0.9 % IV SOLN
Freq: Once | INTRAVENOUS | Status: AC
  Administered 2015-11-21: 16:00:00 via INTRAVENOUS
  Filled 2015-11-21: qty 4

## 2015-11-21 MED ORDER — SODIUM CHLORIDE 0.9 % IJ SOLN
10.0000 mL | INTRAMUSCULAR | Status: DC | PRN
  Administered 2015-11-21: 10 mL via INTRAVENOUS
  Filled 2015-11-21: qty 10

## 2015-11-21 MED ORDER — HEPARIN SOD (PORK) LOCK FLUSH 100 UNIT/ML IV SOLN
500.0000 [IU] | Freq: Once | INTRAVENOUS | Status: AC | PRN
  Administered 2015-11-21: 500 [IU]
  Filled 2015-11-21: qty 5

## 2015-11-21 MED ORDER — SODIUM CHLORIDE 0.9 % IV SOLN
Freq: Once | INTRAVENOUS | Status: AC
  Administered 2015-11-21: 14:00:00 via INTRAVENOUS

## 2015-11-21 MED ORDER — DIPHENHYDRAMINE HCL 50 MG/ML IJ SOLN
50.0000 mg | Freq: Once | INTRAMUSCULAR | Status: AC
  Administered 2015-11-21: 50 mg via INTRAVENOUS

## 2015-11-21 MED ORDER — SODIUM CHLORIDE 0.9 % IV SOLN
10.0000 mg/kg | Freq: Once | INTRAVENOUS | Status: AC
  Administered 2015-11-21: 800 mg via INTRAVENOUS
  Filled 2015-11-21: qty 20

## 2015-11-21 MED ORDER — SODIUM CHLORIDE 0.9 % IJ SOLN
10.0000 mL | INTRAMUSCULAR | Status: DC | PRN
  Administered 2015-11-21: 10 mL
  Filled 2015-11-21: qty 10

## 2015-11-21 MED ORDER — DIPHENHYDRAMINE HCL 50 MG/ML IJ SOLN
INTRAMUSCULAR | Status: AC
  Filled 2015-11-21: qty 1

## 2015-11-21 MED ORDER — ACETAMINOPHEN 325 MG PO TABS
650.0000 mg | ORAL_TABLET | Freq: Once | ORAL | Status: AC
  Administered 2015-11-21: 650 mg via ORAL

## 2015-11-21 NOTE — Patient Instructions (Signed)
[facility] Discharge Instructions for Patients Receiving Chemotherapy  Today you received the following chemotherapy agents Cyramza and Taxotere To help prevent nausea and vomiting after your treatment, we encourage you to take your nausea medication as directed   If you develop nausea and vomiting that is not controlled by your nausea medication, call the clinic.   BELOW ARE SYMPTOMS THAT SHOULD BE REPORTED IMMEDIATELY:  *FEVER GREATER THAN 100.5 F  *CHILLS WITH OR WITHOUT FEVER  NAUSEA AND VOMITING THAT IS NOT CONTROLLED WITH YOUR NAUSEA MEDICATION  *UNUSUAL SHORTNESS OF BREATH  *UNUSUAL BRUISING OR BLEEDING  TENDERNESS IN MOUTH AND THROAT WITH OR WITHOUT PRESENCE OF ULCERS  *URINARY PROBLEMS  *BOWEL PROBLEMS  UNUSUAL RASH Items with * indicate a potential emergency and should be followed up as soon as possible.  Feel free to call the clinic you have any questions or concerns. The clinic phone number is [phone].  Please show the CHEMO ALERT CARD at check-in to the Emergency Department and triage nurse.   

## 2015-11-21 NOTE — Progress Notes (Signed)
[facility] [phone]   Fax:([phone]) [place], [place] [phone]  DIAGNOSIS: Stage IIIB/IV non-small cell lung cancer, adenocarcinoma diagnosed in March of 2015.  PRIOR THERAPY:  1) Systemic chemotherapy with carboplatin for AUC of 5 and Alimta 500 mg/M2 every 3 weeks. First dose expected on 04/13/2014. Status post 6 cycles. 2)  Maintenance chemotherapy with single agent Alimta 500 mg/M2 every 3 weeks. First cycle on 09/06/2014. Status post 3 cycles. 3) Second course of systemic chemotherapy with carboplatin for AUC of 5 and Alimta 500 MG/M2 every 3 weeks. First cycle 11/08/2014. Status post 6 cycles. 4)  Immunotherapy with Nivolumab 3 MG/KG every 2 weeks. First dose 05/24/2015. Status post 3 cycles.  CURRENT THERAPY: Systemic chemotherapy with docetaxel 60 MG/M2 and Cyramza 10 MG/KG every 3 weeks status post 5 cycles.  INTERVAL HISTORY: Shawn Espinoza 74 y.o. male returns to the clinic today for follow up visit accompanied by his wife. He is currently undergoing systemic chemotherapy with docetaxel and Cyramza status post 5 cycles. He continues to have fatigue and aching pain after the Neulasta injection. He denied having any significant chest pain but continues to have shortness of breath with exertion and mild cough with no hemoptysis. He has no significant fever or chills, no nausea or vomiting. He continues to have swelling of the lower extremities and he is currently on torsemide. He is here today to start cycle #6 of his systemic chemotherapy.  MEDICAL HISTORY: Past Medical History  Diagnosis Date  . History of diabetes mellitus, type II     resolved with diet, h/o neuropathy  . Diverticulosis of colon   . Hyperlipidemia   . Hypertension   . Fatty liver 02/29/00    abd ultrasound  . Lower back pain   . Systolic murmur 9163    2Decho - normal LV fxn, EF 55%, mild AS, biatrial  enlargement  . History of tobacco use quit 1990s  . 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 Personal history of colonic adenomas 07/06/2013  . Hypertensive retinopathy of both eyes 2015    mild  . Dysrhythmia     Atrial fib (found 07/2014)  . Shortness of breath   . Pneumonia   . GERD (gastroesophageal reflux disease)     occasional  . Arthritis   . Neuropathy ([place])   . Constipation   . 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 Diabetes mellitus without complication (HCC)     Type 2  . Depression   . Non-small cell carcinoma of left lung ([place]) 02/2014    stage IIIb/IV on chemo  . Full code status 09/26/2015    ALLERGIES:  is allergic to candesartan cilexetil; diltiazem hcl; doxazosin mesylate; nifedipine; pravastatin; red yeast rice; rosuvastatin; sertraline; simvastatin; and hydrocodone.  MEDICATIONS:  Current Outpatient Prescriptions  Medication Sig Dispense Refill  . albuterol (PROVENTIL HFA;VENTOLIN HFA) 108 (90 BASE) MCG/ACT inhaler Inhale 2 puffs into the lungs every 6 (six) hours as needed for wheezing or shortness of breath. 1 Inhaler 3  . Alum & Mag Hydroxide-[place] (MAGIC MOUTHWASH W/LIDOCAINE) SOLN Take 5 mLs by mouth 3 (three) times daily as needed for mouth pain. 5 mL 0  . clotrimazole (LOTRIMIN) 1 % cream Apply  1 application topically 2 (two) times daily. 60 g 0  . dexamethasone (DECADRON) 4 MG tablet 2 tab po bid, the day before, day of and day after chemotherapy every 3 weeks 40 tablet 1  . ELIQUIS 5 MG TABS tablet TAKE 1 TABLET (5 MG TOTAL) BY MOUTH 2 (TWO) TIMES DAILY. 60 tablet 11  . fluorometholone (FML) 0.1 % ophthalmic suspension Place 1 drop into both eyes 2 (two) times daily.  0  . folic acid (FOLVITE) 1 MG tablet TAKE 1 TABLET BY MOUTH EVERY DAY 30 tablet 4  . glipiZIDE (GLUCOTROL) 5 MG tablet Take 1 tablet (5 mg  total) by mouth daily with breakfast. 10 tablet 0  . guaiFENesin (MUCINEX) 600 MG 12 hr tablet Take 600 mg by mouth 2 (two) times daily as needed for cough or to loosen phlegm.     . hydrocortisone (ANUSOL-HC) 25 MG suppository Place 1 suppository (25 mg total) rectally 2 (two) times daily. 12 suppository 0  . KLOR-CON M10 10 MEQ tablet Take 20 mEq by mouth daily as needed (with torsemide).   10  . lidocaine-prilocaine (EMLA) cream Apply 1 application topically as needed. 30 g 1  . loratadine (CLARITIN) 10 MG tablet Take 10 mg by mouth as directed. 3 days before chemo and 4 days after    . metoprolol tartrate (LOPRESSOR) 25 MG tablet TAKE 1 TABLET TWICE A DAY 60 tablet 3  . Multiple Vitamins-Minerals (ICAPS) CAPS Take 1 capsule by mouth 2 (two) times daily.     . sucralfate (CARAFATE) 1 GM/10ML suspension Take 10 mLs (1 g total) by mouth 4 (four) times daily -  with meals and at bedtime. 420 mL 0  . torsemide (DEMADEX) 20 MG tablet Take 20 mg by mouth 2 (two) times daily. Takes one tablet daily and if swelling continues he takes a second pill    . traMADol (ULTRAM) 50 MG tablet Take 1 tablet (50 mg total) by mouth every 6 (six) hours as needed. (Patient taking differently: Take 50 mg by mouth at bedtime. ) 45 tablet 0  . diphenoxylate-atropine (LOMOTIL) 2.5-0.025 MG per tablet Take 2 tablets by mouth 4 (four) times daily as needed for diarrhea or loose stools. (Patient not taking: Reported on 11/21/2015) 30 tablet 0  . polyethylene glycol (MIRALAX / GLYCOLAX) packet Take 8.5 g by mouth daily as needed for moderate constipation.     . saccharomyces boulardii (FLORASTOR) 250 MG capsule Take 1 capsule (250 mg total) by mouth 2 (two) times daily. (Patient not taking: Reported on 11/21/2015) 30 capsule 0   No current facility-administered medications for this visit.    SURGICAL HISTORY:  Past Surgical History  Procedure Laterality Date  . Colonoscopy  2004  . Colonoscopy  2014    tubular adenoma  x1, mod diverticulosis ([place])  . Flexible bronchoscopy  02/2014    WNL  . Video bronchoscopy Bilateral 03/17/2014    Procedure: VIDEO BRONCHOSCOPY WITH FLUORO;  Surgeon: Tanda Rockers, MD;  Location: [facility];  Service: Cardiopulmonary;  Laterality: Bilateral;  . Chest tube insertion Left 08/26/2014    Procedure: INSERTION OF LEFT  PLEURAL DRAINAGE CATHETER;  Surgeon: Ivin Poot, MD;  Location: [facility];  Service: Thoracic;  Laterality: Left;  . Cataract extraction Right 03/2015    Dr [name]  . Portacath placement Right 03/31/2015    Procedure: INSERTION PORT-A-CATH;  Surgeon: Ivin Poot, MD;  Location: [facility];  Service: Thoracic;  Laterality: Right;  . Removal of pleural  drainage catheter Left 03/31/2015    Procedure: REMOVAL OF PLEURAL DRAINAGE CATHETER;  Surgeon: Ivin Poot, MD;  Location: [facility];  Service: Thoracic;  Laterality: Left;    REVIEW OF SYSTEMS:  Constitutional: positive for fatigue Eyes: negative Ears, nose, mouth, throat, and face: negative Respiratory: positive for dyspnea on exertion Cardiovascular: negative Gastrointestinal: negative Genitourinary:negative Integument/breast: negative Hematologic/lymphatic: negative Musculoskeletal:negative Neurological: negative Behavioral/Psych: negative Endocrine: negative Allergic/Immunologic: negative   PHYSICAL EXAMINATION: General appearance: alert, cooperative, fatigued and no distress Head: Normocephalic, without obvious abnormality, atraumatic Neck: no adenopathy, no JVD, supple, symmetrical, trachea midline and thyroid not enlarged, symmetric, no tenderness/mass/nodules Lymph nodes: Cervical, supraclavicular, and axillary nodes normal. Resp: diminished breath sounds LLL and dullness to percussion LLL Back: symmetric, no curvature. ROM normal. No CVA tenderness. Cardio: regular rate and rhythm, S1, S2 normal, no murmur, click, rub or gallop GI: soft, non-tender; bowel sounds normal; no masses,  no  organomegaly Extremities: edema 2+ Neurologic: Alert and oriented X 3, normal strength and tone. Normal symmetric reflexes. Normal coordination and gait  ECOG PERFORMANCE STATUS: 1 - Symptomatic but completely ambulatory  Blood pressure 132/72, pulse 86, temperature 97.5 F (36.4 C), temperature source Oral, resp. rate 18, height '5\' 9"'$  (1.753 m), weight 179 lb 8 oz (81.421 kg), SpO2 97 %.  LABORATORY DATA: Lab Results  Component Value Date   WBC 11.9* 11/21/2015   HGB 11.2* 11/21/2015   HCT 36.0* 11/21/2015   MCV 90.3 11/21/2015   PLT 148 11/21/2015      Chemistry      Component Value Date/Time   NA 142 11/14/2015 1145   NA 136 10/18/2015 0815   K 3.9 11/14/2015 1145   K 3.9 10/18/2015 0815   CL 101 10/18/2015 0815   CO2 28 11/14/2015 1145   CO2 23 10/18/2015 0815   BUN 8.7 11/14/2015 1145   BUN 26* 10/18/2015 0815   CREATININE 0.8 11/14/2015 1145   CREATININE 1.22 10/18/2015 0815   CREATININE 0.83 02/22/2014 1314      Component Value Date/Time   CALCIUM 8.4 11/14/2015 1145   CALCIUM 7.8* 10/18/2015 0815   ALKPHOS 87 11/14/2015 1145   ALKPHOS 64 10/18/2015 0815   AST 19 11/14/2015 1145   AST 33 10/18/2015 0815   ALT 11 11/14/2015 1145   ALT 11* 10/18/2015 0815   BILITOT 0.70 11/14/2015 1145   BILITOT 0.8 10/18/2015 0815       RADIOGRAPHIC STUDIES: No results found. ASSESSMENT AND PLAN: this is a very pleasant 74 years old white male with:  1) Stage IV non-small cell lung cancer, adenocarcinoma presenting with large right upper lobe lung mass in addition to mediastinal and bilateral hilar lymphadenopathy as well as cervical lymphadenopathy and left pleural effusion. The patient completed a course systemic chemotherapy with carboplatin and Alimta status post 6 cycles. This was followed by 3 cycles of maintenance chemotherapy with single agent Alimta. The patient tolerated his treatment well. He had evidence for disease progression and the patient was started on  treatment again with carboplatin and Alimta status post 6 cycles. He was treated with 4 cycles of immunotherapy with Nivolumab discontinued secondary to disease progression. The patient is currently on systemic chemotherapy with docetaxel and Cyramza status post 5 cycles and tolerated the treatment well except for fatigue.  I recommended for him to continue his current treatment with docetaxel and Cyramza. He will receive cycle #6 today. The patient would come back for follow-up visit in 3 weeks for evaluation after repeating CT  scan of the chest, abdomen and pelvis for restaging of his disease.  2) Atrial fibrillation: continue Eliquis.  3) lower extremity edema: Improved.   4) CODE STATUS: Discussed again with the patient and his wife. He would like initial resuscitation but no prolonged support Carafate) vegetative state.  He was advised to call immediately if he has any concerning symptoms in the interval.  The patient voices understanding of current disease status and treatment options and is in agreement with the current care plan.  All questions were answered. The patient knows to call the clinic with any problems, questions or concerns. We can certainly see the patient much sooner if necessary.  Disclaimer: This note was dictated with voice recognition software. Similar sounding words can inadvertently be transcribed and may not be corrected upon review.

## 2015-11-21 NOTE — Patient Instructions (Signed)

## 2015-11-21 NOTE — Telephone Encounter (Signed)
Gave and printed appt sched and avs for pt for DEC...gv barium

## 2015-11-23 ENCOUNTER — Ambulatory Visit: Payer: Medicare Other

## 2015-11-23 ENCOUNTER — Telehealth: Payer: Self-pay | Admitting: *Deleted

## 2015-11-23 NOTE — Telephone Encounter (Signed)
Wife called stating that patient has started to develop nausea/diarrhea with no fever. RN recommended for him to take nausea medication as directed and imodium. Patient was scheduled for neulasta injection today but rescheduled for tomorrow due to symptoms. Wife developed stomach virus Thanksgiving Day is afraid patient may have it. Wife wanted to know if she should still keep neulasta appointment tomorrow or delay it? Message forwarded to [facility].

## 2015-11-24 ENCOUNTER — Ambulatory Visit (HOSPITAL_BASED_OUTPATIENT_CLINIC_OR_DEPARTMENT_OTHER): Payer: Medicare Other

## 2015-11-24 ENCOUNTER — Other Ambulatory Visit: Payer: Self-pay | Admitting: Nurse Practitioner

## 2015-11-24 VITALS — BP 102/52 | HR 105 | Temp 97.8°F

## 2015-11-24 DIAGNOSIS — C3492 Malignant neoplasm of unspecified part of left bronchus or lung: Secondary | ICD-10-CM | POA: Diagnosis not present

## 2015-11-24 DIAGNOSIS — Z5189 Encounter for other specified aftercare: Secondary | ICD-10-CM

## 2015-11-24 MED ORDER — PEGFILGRASTIM INJECTION 6 MG/0.6ML ~~LOC~~
6.0000 mg | PREFILLED_SYRINGE | Freq: Once | SUBCUTANEOUS | Status: AC
  Administered 2015-11-24: 6 mg via SUBCUTANEOUS
  Filled 2015-11-24: qty 0.6

## 2015-11-27 ENCOUNTER — Emergency Department (HOSPITAL_COMMUNITY): Payer: Medicare Other

## 2015-11-27 ENCOUNTER — Other Ambulatory Visit: Payer: Self-pay

## 2015-11-27 ENCOUNTER — Inpatient Hospital Stay (HOSPITAL_COMMUNITY)
Admission: EM | Admit: 2015-11-27 | Discharge: 2015-12-04 | DRG: 871 | Disposition: A | Discharge status: Skilled Nursing Facility | Payer: Medicare Other | Source: Ambulatory Visit | Attending: Internal Medicine | Admitting: Internal Medicine

## 2015-11-27 ENCOUNTER — Encounter (HOSPITAL_COMMUNITY): Payer: Self-pay | Admitting: Emergency Medicine

## 2015-11-27 DIAGNOSIS — Z91018 Allergy to other foods: Secondary | ICD-10-CM

## 2015-11-27 DIAGNOSIS — R05 Cough: Secondary | ICD-10-CM | POA: Diagnosis present

## 2015-11-27 DIAGNOSIS — H3530 Unspecified macular degeneration: Secondary | ICD-10-CM | POA: Diagnosis present

## 2015-11-27 DIAGNOSIS — Z8249 Family history of ischemic heart disease and other diseases of the circulatory system: Secondary | ICD-10-CM

## 2015-11-27 DIAGNOSIS — E119 Type 2 diabetes mellitus without complications: Secondary | ICD-10-CM | POA: Diagnosis present

## 2015-11-27 DIAGNOSIS — J9811 Atelectasis: Secondary | ICD-10-CM | POA: Diagnosis not present

## 2015-11-27 DIAGNOSIS — B1920 Unspecified viral hepatitis C without hepatic coma: Secondary | ICD-10-CM | POA: Diagnosis not present

## 2015-11-27 DIAGNOSIS — W19XXXA Unspecified fall, initial encounter: Secondary | ICD-10-CM | POA: Diagnosis present

## 2015-11-27 DIAGNOSIS — E872 Acidosis: Secondary | ICD-10-CM | POA: Diagnosis present

## 2015-11-27 DIAGNOSIS — R531 Weakness: Secondary | ICD-10-CM | POA: Diagnosis not present

## 2015-11-27 DIAGNOSIS — E876 Hypokalemia: Secondary | ICD-10-CM | POA: Diagnosis present

## 2015-11-27 DIAGNOSIS — R6521 Severe sepsis with septic shock: Secondary | ICD-10-CM | POA: Diagnosis present

## 2015-11-27 DIAGNOSIS — I482 Chronic atrial fibrillation: Secondary | ICD-10-CM | POA: Diagnosis present

## 2015-11-27 DIAGNOSIS — Z7901 Long term (current) use of anticoagulants: Secondary | ICD-10-CM

## 2015-11-27 DIAGNOSIS — R509 Fever, unspecified: Secondary | ICD-10-CM | POA: Diagnosis not present

## 2015-11-27 DIAGNOSIS — K219 Gastro-esophageal reflux disease without esophagitis: Secondary | ICD-10-CM | POA: Diagnosis present

## 2015-11-27 DIAGNOSIS — T451X5A Adverse effect of antineoplastic and immunosuppressive drugs, initial encounter: Secondary | ICD-10-CM | POA: Diagnosis present

## 2015-11-27 DIAGNOSIS — D701 Agranulocytosis secondary to cancer chemotherapy: Secondary | ICD-10-CM | POA: Diagnosis present

## 2015-11-27 DIAGNOSIS — Y92009 Unspecified place in unspecified non-institutional (private) residence as the place of occurrence of the external cause: Secondary | ICD-10-CM | POA: Diagnosis not present

## 2015-11-27 DIAGNOSIS — D61818 Other pancytopenia: Secondary | ICD-10-CM

## 2015-11-27 DIAGNOSIS — K760 Fatty (change of) liver, not elsewhere classified: Secondary | ICD-10-CM | POA: Diagnosis present

## 2015-11-27 DIAGNOSIS — Z79899 Other long term (current) drug therapy: Secondary | ICD-10-CM | POA: Diagnosis not present

## 2015-11-27 DIAGNOSIS — Z7952 Long term (current) use of systemic steroids: Secondary | ICD-10-CM | POA: Diagnosis not present

## 2015-11-27 DIAGNOSIS — Z888 Allergy status to other drugs, medicaments and biological substances status: Secondary | ICD-10-CM

## 2015-11-27 DIAGNOSIS — C3492 Malignant neoplasm of unspecified part of left bronchus or lung: Secondary | ICD-10-CM | POA: Diagnosis present

## 2015-11-27 DIAGNOSIS — E46 Unspecified protein-calorie malnutrition: Secondary | ICD-10-CM | POA: Diagnosis present

## 2015-11-27 DIAGNOSIS — B379 Candidiasis, unspecified: Secondary | ICD-10-CM | POA: Diagnosis not present

## 2015-11-27 DIAGNOSIS — Z87891 Personal history of nicotine dependence: Secondary | ICD-10-CM

## 2015-11-27 DIAGNOSIS — R1314 Dysphagia, pharyngoesophageal phase: Secondary | ICD-10-CM | POA: Diagnosis not present

## 2015-11-27 DIAGNOSIS — I10 Essential (primary) hypertension: Secondary | ICD-10-CM | POA: Diagnosis present

## 2015-11-27 DIAGNOSIS — D61810 Antineoplastic chemotherapy induced pancytopenia: Secondary | ICD-10-CM | POA: Diagnosis not present

## 2015-11-27 DIAGNOSIS — Z79891 Long term (current) use of opiate analgesic: Secondary | ICD-10-CM

## 2015-11-27 DIAGNOSIS — Z7984 Long term (current) use of oral hypoglycemic drugs: Secondary | ICD-10-CM | POA: Diagnosis not present

## 2015-11-27 DIAGNOSIS — R61 Generalized hyperhidrosis: Secondary | ICD-10-CM | POA: Diagnosis present

## 2015-11-27 DIAGNOSIS — Z823 Family history of stroke: Secondary | ICD-10-CM

## 2015-11-27 DIAGNOSIS — D703 Neutropenia due to infection: Secondary | ICD-10-CM | POA: Diagnosis present

## 2015-11-27 DIAGNOSIS — N179 Acute kidney failure, unspecified: Secondary | ICD-10-CM | POA: Diagnosis present

## 2015-11-27 DIAGNOSIS — J9601 Acute respiratory failure with hypoxia: Secondary | ICD-10-CM | POA: Diagnosis present

## 2015-11-27 DIAGNOSIS — I480 Paroxysmal atrial fibrillation: Secondary | ICD-10-CM | POA: Diagnosis present

## 2015-11-27 DIAGNOSIS — Y95 Nosocomial condition: Secondary | ICD-10-CM | POA: Diagnosis present

## 2015-11-27 DIAGNOSIS — A419 Sepsis, unspecified organism: Principal | ICD-10-CM | POA: Diagnosis present

## 2015-11-27 DIAGNOSIS — Z885 Allergy status to narcotic agent status: Secondary | ICD-10-CM

## 2015-11-27 DIAGNOSIS — R0602 Shortness of breath: Secondary | ICD-10-CM | POA: Diagnosis not present

## 2015-11-27 DIAGNOSIS — J189 Pneumonia, unspecified organism: Secondary | ICD-10-CM | POA: Diagnosis not present

## 2015-11-27 DIAGNOSIS — M6281 Muscle weakness (generalized): Secondary | ICD-10-CM | POA: Diagnosis not present

## 2015-11-27 DIAGNOSIS — K209 Esophagitis, unspecified: Secondary | ICD-10-CM | POA: Diagnosis not present

## 2015-11-27 DIAGNOSIS — T458X5A Adverse effect of other primarily systemic and hematological agents, initial encounter: Secondary | ICD-10-CM | POA: Diagnosis present

## 2015-11-27 DIAGNOSIS — E785 Hyperlipidemia, unspecified: Secondary | ICD-10-CM | POA: Diagnosis present

## 2015-11-27 DIAGNOSIS — R404 Transient alteration of awareness: Secondary | ICD-10-CM | POA: Diagnosis not present

## 2015-11-27 DIAGNOSIS — R768 Other specified abnormal immunological findings in serum: Secondary | ICD-10-CM | POA: Diagnosis present

## 2015-11-27 DIAGNOSIS — M1990 Unspecified osteoarthritis, unspecified site: Secondary | ICD-10-CM | POA: Diagnosis present

## 2015-11-27 DIAGNOSIS — B370 Candidal stomatitis: Secondary | ICD-10-CM | POA: Diagnosis present

## 2015-11-27 HISTORY — DX: Pneumonia, unspecified organism: J18.9

## 2015-11-27 HISTORY — DX: Unspecified atrial fibrillation: I48.91

## 2015-11-27 LAB — URINALYSIS, ROUTINE W REFLEX MICROSCOPIC
Bilirubin Urine: NEGATIVE
GLUCOSE, UA: NEGATIVE mg/dL
HGB URINE DIPSTICK: NEGATIVE
Ketones, ur: NEGATIVE mg/dL
Leukocytes, UA: NEGATIVE
Nitrite: NEGATIVE
Protein, ur: NEGATIVE mg/dL
SPECIFIC GRAVITY, URINE: 1.012 (ref 1.005–1.030)
pH: 5.5 (ref 5.0–8.0)

## 2015-11-27 LAB — BASIC METABOLIC PANEL
ANION GAP: 7 (ref 5–15)
BUN: 42 mg/dL — ABNORMAL HIGH (ref 6–20)
CALCIUM: 7.3 mg/dL — AB (ref 8.9–10.3)
CHLORIDE: 105 mmol/L (ref 101–111)
CO2: 23 mmol/L (ref 22–32)
CREATININE: 1.32 mg/dL — AB (ref 0.61–1.24)
GFR calc non Af Amer: 51 mL/min — ABNORMAL LOW (ref >60)
GFR, EST AFRICAN AMERICAN: 59 mL/min — AB (ref >60)
Glucose, Bld: 219 mg/dL — ABNORMAL HIGH (ref 65–99)
Potassium: 5.1 mmol/L (ref 3.5–5.1)
SODIUM: 135 mmol/L (ref 135–145)

## 2015-11-27 LAB — COMPREHENSIVE METABOLIC PANEL
ALBUMIN: 1.3 g/dL — AB (ref 3.5–5.0)
ALK PHOS: 24 U/L — AB (ref 38–126)
ALT: 15 U/L — AB (ref 17–63)
AST: 14 U/L — AB (ref 15–41)
Anion gap: 7 (ref 5–15)
BILIRUBIN TOTAL: 0.9 mg/dL (ref 0.3–1.2)
BUN: 28 mg/dL — AB (ref 6–20)
CALCIUM: 4.4 mg/dL — AB (ref 8.9–10.3)
CO2: 13 mmol/L — ABNORMAL LOW (ref 22–32)
CREATININE: 0.75 mg/dL (ref 0.61–1.24)
Chloride: 122 mmol/L — ABNORMAL HIGH (ref 101–111)
GFR calc Af Amer: >60 mL/min (ref >60)
GFR calc non Af Amer: >60 mL/min (ref >60)
GLUCOSE: 93 mg/dL (ref 65–99)
Potassium: 2.1 mmol/L — CL (ref 3.5–5.1)
Sodium: 142 mmol/L (ref 135–145)

## 2015-11-27 LAB — CBC WITH DIFFERENTIAL/PLATELET
Basophils Absolute: 0 10*3/uL (ref 0.0–0.1)
Basophils Relative: 6 %
EOS PCT: 0 %
Eosinophils Absolute: 0 10*3/uL (ref 0.0–0.7)
HEMATOCRIT: 19.1 % — AB (ref 39.0–52.0)
HEMOGLOBIN: 6.1 g/dL — AB (ref 13.0–17.0)
LYMPHS ABS: 0.2 10*3/uL — AB (ref 0.7–4.0)
Lymphocytes Relative: 64 %
MCH: 29.6 pg (ref 26.0–34.0)
MCHC: 31.9 g/dL (ref 30.0–36.0)
MCV: 92.7 fL (ref 78.0–100.0)
MONOS PCT: 24 %
Monocytes Absolute: 0 10*3/uL — ABNORMAL LOW (ref 0.1–1.0)
Neutro Abs: 0 10*3/uL — ABNORMAL LOW (ref 1.7–7.7)
Neutrophils Relative %: 6 %
Platelets: 18 10*3/uL — CL (ref 150–400)
RBC: 2.06 MIL/uL — AB (ref 4.22–5.81)
RDW: 17.3 % — ABNORMAL HIGH (ref 11.5–15.5)
WBC: 0.2 10*3/uL — AB (ref 4.0–10.5)

## 2015-11-27 LAB — STREP PNEUMONIAE URINARY ANTIGEN: STREP PNEUMO URINARY ANTIGEN: NEGATIVE

## 2015-11-27 LAB — GLUCOSE, CAPILLARY
GLUCOSE-CAPILLARY: 183 mg/dL — AB (ref 65–99)
Glucose-Capillary: 128 mg/dL — ABNORMAL HIGH (ref 65–99)
Glucose-Capillary: 215 mg/dL — ABNORMAL HIGH (ref 65–99)

## 2015-11-27 LAB — TROPONIN I
Troponin I: <0.03 ng/mL (ref <0.031)
Troponin I: <0.03 ng/mL (ref <0.031)

## 2015-11-27 LAB — PREPARE RBC (CROSSMATCH)

## 2015-11-27 LAB — TSH: TSH: 2.034 u[IU]/mL (ref 0.350–4.500)

## 2015-11-27 LAB — MRSA PCR SCREENING: MRSA BY PCR: NEGATIVE

## 2015-11-27 LAB — LACTIC ACID, PLASMA
Lactic Acid, Venous: 2.8 mmol/L (ref 0.5–2.0)
Lactic Acid, Venous: 2.7 mmol/L (ref 0.5–2.0)

## 2015-11-27 LAB — MAGNESIUM: MAGNESIUM: 0.8 mg/dL — AB (ref 1.7–2.4)

## 2015-11-27 LAB — BRAIN NATRIURETIC PEPTIDE: B Natriuretic Peptide: 128.7 pg/mL — ABNORMAL HIGH (ref 0.0–100.0)

## 2015-11-27 MED ORDER — PHENYLEPHRINE HCL 10 MG/ML IJ SOLN
25.0000 ug/min | INTRAVENOUS | Status: DC
  Filled 2015-11-27: qty 1

## 2015-11-27 MED ORDER — POTASSIUM CHLORIDE CRYS ER 20 MEQ PO TBCR
40.0000 meq | EXTENDED_RELEASE_TABLET | Freq: Once | ORAL | Status: AC
  Administered 2015-11-27: 40 meq via ORAL
  Filled 2015-11-27: qty 2

## 2015-11-27 MED ORDER — POTASSIUM CHLORIDE 10 MEQ/100ML IV SOLN
10.0000 meq | INTRAVENOUS | Status: AC
  Administered 2015-11-27 (×2): 10 meq via INTRAVENOUS
  Filled 2015-11-27: qty 100

## 2015-11-27 MED ORDER — HYDROCORTISONE NA SUCCINATE PF 100 MG IJ SOLR
50.0000 mg | Freq: Four times a day (QID) | INTRAMUSCULAR | Status: DC
  Administered 2015-11-27 – 2015-11-28 (×4): 50 mg via INTRAVENOUS
  Filled 2015-11-27 (×4): qty 2

## 2015-11-27 MED ORDER — SODIUM CHLORIDE 0.9 % IV SOLN
1.0000 g | Freq: Once | INTRAVENOUS | Status: AC
  Administered 2015-11-27: 1 g via INTRAVENOUS
  Filled 2015-11-27: qty 10

## 2015-11-27 MED ORDER — AMIODARONE HCL IN DEXTROSE 360-4.14 MG/200ML-% IV SOLN
30.0000 mg/h | INTRAVENOUS | Status: DC
  Administered 2015-11-27 – 2015-11-28 (×2): 30 mg/h via INTRAVENOUS
  Filled 2015-11-27 (×2): qty 200

## 2015-11-27 MED ORDER — VANCOMYCIN HCL IN DEXTROSE 1-5 GM/200ML-% IV SOLN
1000.0000 mg | Freq: Two times a day (BID) | INTRAVENOUS | Status: DC
  Administered 2015-11-27 – 2015-11-28 (×2): 1000 mg via INTRAVENOUS
  Filled 2015-11-27 (×3): qty 200

## 2015-11-27 MED ORDER — SODIUM CHLORIDE 0.9 % IV SOLN: 250.0000 mL | INTRAVENOUS | Status: DC | PRN

## 2015-11-27 MED ORDER — NOREPINEPHRINE BITARTRATE 1 MG/ML IV SOLN
0.0000 ug/min | Freq: Once | INTRAVENOUS | Status: DC
  Filled 2015-11-27: qty 4

## 2015-11-27 MED ORDER — SODIUM CHLORIDE 0.9 % IV SOLN
INTRAVENOUS | Status: DC
  Administered 2015-11-27: 10:00:00 via INTRAVENOUS

## 2015-11-27 MED ORDER — SODIUM CHLORIDE 0.9 % IV SOLN
Freq: Once | INTRAVENOUS | Status: AC
  Administered 2015-11-27: 08:00:00 via INTRAVENOUS

## 2015-11-27 MED ORDER — PIPERACILLIN-TAZOBACTAM 3.375 G IVPB
3.3750 g | Freq: Once | INTRAVENOUS | Status: AC
  Administered 2015-11-27: 3.375 g via INTRAVENOUS
  Filled 2015-11-27: qty 50

## 2015-11-27 MED ORDER — POTASSIUM CHLORIDE 10 MEQ/100ML IV SOLN
INTRAVENOUS | Status: AC
  Administered 2015-11-27: 10 meq
  Filled 2015-11-27: qty 100

## 2015-11-27 MED ORDER — ACETAMINOPHEN 325 MG PO TABS
650.0000 mg | ORAL_TABLET | ORAL | Status: DC | PRN
  Administered 2015-12-02 – 2015-12-04 (×2): 650 mg via ORAL
  Filled 2015-11-27 (×3): qty 2

## 2015-11-27 MED ORDER — AMIODARONE LOAD VIA INFUSION: 150.0000 mg | Freq: Once | INTRAVENOUS | Status: DC

## 2015-11-27 MED ORDER — VANCOMYCIN HCL IN DEXTROSE 1-5 GM/200ML-% IV SOLN
1000.0000 mg | Freq: Once | INTRAVENOUS | Status: AC
  Administered 2015-11-27: 1000 mg via INTRAVENOUS
  Filled 2015-11-27: qty 200

## 2015-11-27 MED ORDER — SODIUM CHLORIDE 0.9 % IV BOLUS (SEPSIS)
2000.0000 mL | Freq: Once | INTRAVENOUS | Status: AC
  Administered 2015-11-27: 2000 mL via INTRAVENOUS

## 2015-11-27 MED ORDER — ALBUTEROL SULFATE (2.5 MG/3ML) 0.083% IN NEBU
2.5000 mg | INHALATION_SOLUTION | RESPIRATORY_TRACT | Status: DC | PRN
  Administered 2015-11-29 (×3): 2.5 mg via RESPIRATORY_TRACT
  Filled 2015-11-27 (×3): qty 3

## 2015-11-27 MED ORDER — PANTOPRAZOLE SODIUM 40 MG IV SOLR
40.0000 mg | Freq: Every day | INTRAVENOUS | Status: DC
  Administered 2015-11-27: 40 mg via INTRAVENOUS
  Filled 2015-11-27: qty 40

## 2015-11-27 MED ORDER — POTASSIUM CHLORIDE 20 MEQ/15ML (10%) PO SOLN
40.0000 meq | ORAL | Status: DC
Start: 2015-11-27 — End: 2015-11-27
  Administered 2015-11-27 (×2): 40 meq via ORAL
  Filled 2015-11-27 (×2): qty 30

## 2015-11-27 MED ORDER — LACTATED RINGERS IV SOLN
INTRAVENOUS | Status: DC
  Administered 2015-11-27 – 2015-11-29 (×2): via INTRAVENOUS

## 2015-11-27 MED ORDER — PIPERACILLIN-TAZOBACTAM 3.375 G IVPB
3.3750 g | Freq: Three times a day (TID) | INTRAVENOUS | Status: DC
  Administered 2015-11-27 – 2015-12-04 (×21): 3.375 g via INTRAVENOUS
  Filled 2015-11-27 (×21): qty 50

## 2015-11-27 MED ORDER — AMIODARONE HCL IN DEXTROSE 360-4.14 MG/200ML-% IV SOLN
60.0000 mg/h | INTRAVENOUS | Status: AC
  Administered 2015-11-27: 60 mg/h via INTRAVENOUS
  Filled 2015-11-27: qty 200

## 2015-11-27 MED ORDER — SODIUM CHLORIDE 0.9 % IV BOLUS (SEPSIS)
500.0000 mL | Freq: Once | INTRAVENOUS | Status: AC
  Administered 2015-11-27: 500 mL via INTRAVENOUS

## 2015-11-27 MED ORDER — POTASSIUM CHLORIDE 20 MEQ/15ML (10%) PO SOLN
40.0000 meq | Freq: Once | ORAL | Status: AC
  Administered 2015-11-27: 40 meq via ORAL
  Filled 2015-11-27: qty 30

## 2015-11-27 MED ORDER — ONDANSETRON HCL 4 MG/2ML IJ SOLN
4.0000 mg | Freq: Four times a day (QID) | INTRAMUSCULAR | Status: DC | PRN
  Administered 2015-11-27: 4 mg via INTRAVENOUS
  Filled 2015-11-27: qty 2

## 2015-11-27 MED ORDER — SODIUM CHLORIDE 0.9 % IV SOLN
Freq: Once | INTRAVENOUS | Status: AC
  Administered 2015-11-27: 12:00:00 via INTRAVENOUS

## 2015-11-27 MED ORDER — AMIODARONE IV BOLUS ONLY 150 MG/100ML
150.0000 mg | Freq: Once | INTRAVENOUS | Status: AC
  Administered 2015-11-27: 150 mg via INTRAVENOUS
  Filled 2015-11-27: qty 100

## 2015-11-27 MED ORDER — LIDOCAINE-PRILOCAINE 2.5-2.5 % EX CREA
TOPICAL_CREAM | Freq: Once | CUTANEOUS | Status: AC
  Administered 2015-11-27: 08:00:00 via TOPICAL
  Filled 2015-11-27: qty 5

## 2015-11-27 MED ORDER — SODIUM CHLORIDE 0.9 % IV BOLUS (SEPSIS)
1000.0000 mL | Freq: Once | INTRAVENOUS | Status: DC
  Administered 2015-11-27: 1000 mL via INTRAVENOUS

## 2015-11-27 NOTE — ED Notes (Signed)
Pt arrived from home via EMS after being called by wife after falling, He hit the top of his head. He also has a skin tear to the right forearm. No LOC, but lethargic upon EMS arrival. He was placed on a non rebreather due to  Hypoxia oxygen saturation of 86 %. Pt was placed on the monitor, tachycardia, high as 170. Pt has history of A.fib. And cancer. He received his 3 chemotherapy treatment on Wednesday, since then he has been weak, lethargic, and more pain. Per EMS, left side rhonchi.

## 2015-11-27 NOTE — ED Notes (Signed)
Per family request, they would like port accessed.

## 2015-11-27 NOTE — ED Notes (Signed)
Per EDP. Pt to stay in ED until seen by critical care provider

## 2015-11-27 NOTE — ED Provider Notes (Signed)
CSN: [id number]     Arrival date & time 11/27/15  0543 History   First MD Initiated Contact with Patient 11/27/15 [phone]     Chief Complaint  Patient presents with  . Atrial Fibrillation  . Fall      HPI Patient is brought to the emergency department after falling this morning with increased generalized weakness of the past several days.  He is a patient currently being treated for his lung cancer with chemotherapy.  He received Neulasta on Friday but is continued feeling weaker per his family.  His had mild decreased oral intake.  He had some diarrhea yesterday.  Patient denies active chest pain at this time.  He reports feeling short of breath and weak.  He is found to be hypoxic by EMS.  He does have a history of paroxysmal A. fib and continues on chronic anticoagulation for this.  He is normally in sinus rhythm.  He presents in atrial fibrillation.  Telemetry reports no fevers at home.  No new rash.  Patient denies abdominal pain.   Past Medical History  Diagnosis Date  . History of diabetes mellitus, type II     resolved with diet, h/o neuropathy  . Diverticulosis of colon   . Hyperlipidemia   . Hypertension   . Fatty liver 02/29/00    abd ultrasound  . Lower back pain   . Systolic murmur 7026    2Decho - normal LV fxn, EF 55%, mild AS, biatrial enlargement  . History of tobacco use quit 1990s  . 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 Personal history of colonic adenomas 07/06/2013  . Hypertensive retinopathy of both eyes 2015    mild  . Dysrhythmia     Atrial fib (found 07/2014)  . Shortness of breath   . Pneumonia   . GERD (gastroesophageal reflux disease)     occasional  . Arthritis   . Neuropathy ([place])   . Constipation   . 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 Diabetes mellitus without complication (HCC)     Type 2  .  Depression   . 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 Full code status 09/26/2015   Past Surgical History  Procedure Laterality Date  . Colonoscopy  2004  . Colonoscopy  2014    tubular adenoma x1, mod diverticulosis ([place])  . Flexible bronchoscopy  02/2014    WNL  . Video bronchoscopy Bilateral 03/17/2014    Procedure: VIDEO BRONCHOSCOPY WITH FLUORO;  Surgeon: Tanda Rockers, MD;  Location: [facility];  Service: Cardiopulmonary;  Laterality: Bilateral;  . Chest tube insertion Left 08/26/2014    Procedure: INSERTION OF LEFT  PLEURAL DRAINAGE CATHETER;  Surgeon: Ivin Poot, MD;  Location: [place];  Service: Thoracic;  Laterality: Left;  . Cataract extraction Right 03/2015    Dr [name]  . Portacath placement Right 03/31/2015    Procedure: INSERTION PORT-A-CATH;  Surgeon: Ivin Poot, MD;  Location: [facility] OR;  Service: Thoracic;  Laterality: Right;  . Removal of pleural drainage catheter Left 03/31/2015    Procedure: REMOVAL OF PLEURAL DRAINAGE CATHETER;  Surgeon: Ivin Poot, MD;  Location: [facility] OR;  Service: Thoracic;  Laterality: Left;   Family History  Problem Relation Age of Onset  .  Stroke Mother     multiple mini strokes  . Hypertension Mother   . Heart disease Maternal Grandfather     MI  . Stroke Paternal Grandmother   . Colon cancer Neg Hx    Social History  Substance Use Topics  . Smoking status: Former Smoker -- 3.00 packs/day for 30 years    Types: Cigarettes    Quit date: 12/23/1988  . Smokeless tobacco: Never Used  . Alcohol Use: 12.6 oz/week    21 Glasses of wine per week     Comment: 3-4 wine a day ( 8/03- 2 beers, 2 wines, 2 brandies daily)    Review of Systems  All other systems reviewed and are negative.     Allergies  Candesartan cilexetil; Diltiazem hcl; Doxazosin mesylate; Nifedipine; Pravastatin; Red yeast rice; Rosuvastatin; Sertraline; Simvastatin; and Hydrocodone  Home Medications   Prior to  Admission medications   Medication Sig Start Date End Date Taking? Authorizing Provider  albuterol (PROVENTIL HFA;VENTOLIN HFA) 108 (90 BASE) MCG/ACT inhaler Inhale 2 puffs into the lungs every 6 (six) hours as needed for wheezing or shortness of breath. 09/08/15  Yes Carlton Adam, PA-C  Alum & Mag Hydroxide-Simeth (MAGIC MOUTHWASH W/LIDOCAINE) SOLN Take 5 mLs by mouth 3 (three) times daily as needed for mouth pain. 03/03/15  Yes Curt Bears, MD  dexamethasone (DECADRON) 4 MG tablet 2 tab po bid, the day before, day of and day after chemotherapy every 3 weeks 07/11/15  Yes Curt Bears, MD  diphenoxylate-atropine (LOMOTIL) 2.5-0.025 MG per tablet Take 2 tablets by mouth 4 (four) times daily as needed for diarrhea or loose stools. 01/17/15  Yes Susanne Borders, NP  ELIQUIS 5 MG TABS tablet TAKE 1 TABLET (5 MG TOTAL) BY MOUTH 2 (TWO) TIMES DAILY. 10/04/15  Yes [name], MD  glipiZIDE (GLUCOTROL) 5 MG tablet Take 1 tablet (5 mg total) by mouth daily with breakfast. Patient taking differently: Take 5 mg by mouth as directed. The day before, the day of, and the day after chemo. 10/20/15  Yes Charlynne Cousins, MD  guaiFENesin (MUCINEX) 600 MG 12 hr tablet Take 600 mg by mouth 2 (two) times daily as needed for cough or to loosen phlegm.    Yes Historical Provider, MD  hydrocortisone (ANUSOL-HC) 25 MG suppository Place 1 suppository (25 mg total) rectally 2 (two) times daily. Patient taking differently: Place 25 mg rectally 2 (two) times daily as needed for hemorrhoids.  07/26/15  Yes Jearld Fenton, NP  KLOR-CON M10 10 MEQ tablet Take 20 mEq by mouth daily as needed (with torsemide).  05/02/15  Yes Historical Provider, MD  lidocaine-prilocaine (EMLA) cream Apply 1 application topically as needed. 04/04/15  Yes Adrena E Johnson, PA-C  loratadine (CLARITIN) 10 MG tablet Take 10 mg by mouth as directed. 3 days before chemo and 4 days after   Yes Historical Provider, MD  metoprolol tartrate  (LOPRESSOR) 25 MG tablet TAKE 1 TABLET TWICE A DAY 09/04/15  Yes [name], MD  Multiple Vitamins-Minerals (ICAPS) CAPS Take 1 capsule by mouth 2 (two) times daily.    Yes Historical Provider, MD  polyethylene glycol (MIRALAX / GLYCOLAX) packet Take 8.5 g by mouth daily as needed for moderate constipation.    Yes Historical Provider, MD  sucralfate (CARAFATE) 1 GM/10ML suspension Take 10 mLs (1 g total) by mouth 4 (four) times daily -  with meals and at bedtime. 10/16/15  Yes Susanne Borders, NP  torsemide (DEMADEX) 20 MG tablet Take  20 mg by mouth 2 (two) times daily. Takes one tablet daily and if swelling continues he takes a second pill 08/18/15  Yes [name], MD  traMADol (ULTRAM) 50 MG tablet Take 1 tablet (50 mg total) by mouth every 6 (six) hours as needed. Patient taking differently: Take 50 mg by mouth at bedtime.  10/16/15  Yes Susanne Borders, NP  clotrimazole (LOTRIMIN) 1 % cream Apply 1 application topically 2 (two) times daily. Patient not taking: Reported on 11/27/2015 09/08/15   [name], MD  folic acid (FOLVITE) 1 MG tablet TAKE 1 TABLET BY MOUTH EVERY DAY Patient not taking: Reported on 11/27/2015 07/25/15   Curt Bears, MD  saccharomyces boulardii (FLORASTOR) 250 MG capsule Take 1 capsule (250 mg total) by mouth 2 (two) times daily. Patient not taking: Reported on 11/21/2015 10/20/15   Charlynne Cousins, MD   BP 98/61 mmHg  Pulse 148  Temp(Src) 98.5 F (36.9 C) (Oral)  Resp 36  SpO2 90% Physical Exam  Constitutional: He is oriented to person, place, and time. He appears well-developed and well-nourished.  Ill-appearing.  Tachypnea  HENT:  Head: Normocephalic and atraumatic.  Eyes: EOM are normal.  Neck: Normal range of motion.  Cardiovascular: Intact distal pulses.   Irregularly irregular.  Tachycardic rate.  Pulmonary/Chest:  Rhonchi bilaterally.  Tachypnea  Abdominal: Soft. He exhibits no distension. There is no tenderness.  Musculoskeletal:  Normal range of motion.  Neurological: He is alert and oriented to person, place, and time.  Skin: Skin is warm and dry. There is pallor.  Psychiatric: He has a normal mood and affect. Judgment normal.  Nursing note and vitals reviewed.   ED Course  Procedures (including critical care time)  CRITICAL CARE Performed by: Hoy Morn Total critical care time: 40 minutes Critical care time was exclusive of separately billable procedures and treating other patients. Critical care was necessary to treat or prevent imminent or life-threatening deterioration. Critical care was time spent personally by me on the following activities: development of treatment plan with patient and/or surrogate as well as nursing, discussions with consultants, evaluation of patient's response to treatment, examination of patient, obtaining history from patient or surrogate, ordering and performing treatments and interventions, ordering and review of laboratory studies, ordering and review of radiographic studies, pulse oximetry and re-evaluation of patient's condition.   Labs Review Labs Reviewed  CBC WITH DIFFERENTIAL/PLATELET - Abnormal; Notable for the following:    WBC 0.2 (*)    RBC 2.06 (*)    Hemoglobin 6.1 (*)    HCT 19.1 (*)    RDW 17.3 (*)    Platelets 18 (*)    Neutro Abs 0.0 (*)    Lymphs Abs 0.2 (*)    Monocytes Absolute 0.0 (*)    All other components within normal limits  COMPREHENSIVE METABOLIC PANEL - Abnormal; Notable for the following:    Potassium 2.1 (*)    Chloride 122 (*)    CO2 13 (*)    BUN 28 (*)    Calcium 4.4 (*)    Total Protein <3.0 (*)    Albumin 1.3 (*)    AST 14 (*)    ALT 15 (*)    Alkaline Phosphatase 24 (*)    All other components within normal limits  BRAIN NATRIURETIC PEPTIDE - Abnormal; Notable for the following:    B Natriuretic Peptide 128.7 (*)    All other components within normal limits  LACTIC ACID, PLASMA - Abnormal; Notable for the following:  Lactic Acid, Venous 2.8 (*)    All other components within normal limits  URINALYSIS, ROUTINE W REFLEX MICROSCOPIC (NOT AT [facility]) - Abnormal; Notable for the following:    Color, Urine AMBER (*)    All other components within normal limits  CULTURE, BLOOD (ROUTINE X 2)  CULTURE, BLOOD (ROUTINE X 2)  URINE CULTURE  TROPONIN I  PREPARE RBC (CROSSMATCH)    Imaging Review Dg Chest Portable 1 View  11/27/2015  CLINICAL DATA:  74 year old male with shortness of breath, hypoxia and tachycardia EXAM: PORTABLE CHEST 1 VIEW COMPARISON:  Chest radiograph dated 10/17/2015 FINDINGS: Single portable view of the chest demonstrate patchy area of ground-glass and nodular airspace opacity predominantly involving the left mid and lower lung field. There has been interval progression of the airspace opacity compared to prior study. 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 Trace left pleural effusion may present. The cardiac silhouette is within normal limits. Right pectoral Port-A-Cath again noted in stable positioning. There is degenerative changes of the osseous structures. No acute fracture. IMPRESSION: Left mid and lower lung field ground-glass and nodular airspace opacity with interval progression compared to the prior study. Findings concerning for pneumonia. Clinical correlation and follow-up recommended. Electronically Signed   By: Anner Crete M.D.   On: 11/27/2015 06:16   I have personally reviewed and evaluated these images and lab results as part of my medical decision-making.   EKG Interpretation   Date/Time:  Monday November 27 2015 05:57:24 EST Ventricular Rate:  140 PR Interval:    QRS Duration: 92 QT Interval:  319 QTC Calculation: 487 R Axis:   80 Text Interpretation:  Atrial fibrillation Ventricular premature complex  Borderline low voltage, extremity leads Repolarization abnormality, prob  rate related Borderline prolonged QT interval afib is new  since prior  tracing on Oct 17 2015 Confirmed by Xion Debruyne  MD, Lennette Bihari ([id number]) on 11/27/2015  6:25:04 AM      MDM   Final diagnoses:  HCAP (healthcare-associated pneumonia)  Sepsis, due to unspecified organism ([place])  Pancytopenia ([place])  Hypokalemia  Hypocalcemia    Patient with healthcare associated pneumonia and sepsis.  Patient given 30 cc/kg normal saline bolus.  Patient be started on vancomycin and Zosyn.  Lactate is elevated 2.8.  He is in A. fib with RVR and at this time we will see how he responds to IV resuscitation with IV fluids at this time.  He is hypotensive and I likely think this is secondary to sepsis.  At this time I do not believe he needs to be cardioverted.  He is on chronic anticoagulation and therefore if things were to change or worsen this is an option.  He appears to be pancytopenic.  He'll be transfused 2 units of blood at this time for hemoglobin of 6.1.  Also has severe hypokalemia with a critical potassium of 2.1.  I spoke with the intensive care unit team who will evaluate the patient in the emergency department.  PCCM: Dr Jannifer Rodney, MD 11/27/15 [phone]

## 2015-11-27 NOTE — ED Notes (Signed)
Critical Potassium : 2.1 Critical Calcium : 4.4  Critical Lactic Acid : 2.8  Received from Lab at 7:04 AM

## 2015-11-27 NOTE — H&P (Signed)
PULMONARY / CRITICAL CARE MEDICINE   Name: Shawn Espinoza MRN: [id number] DOB: 1941-09-30    ADMISSION DATE:  11/27/2015 CONSULTATION DATE:  12/5  REFERRING MD:  [name]   CHIEF COMPLAINT:  Acute hypoxic respiratory failure   HISTORY OF PRESENT ILLNESS:   This is a 74 year old male w/ h/o stage IV adenocarcinoma of the lung. Now undergoing treatment w/ Docetaxel and Cyramza for disease progression w/ [name]. He was in his usual state of health until around 12/3 when he felt fairly sudden onset of nasal congestion, fatigue, fever, chills, cough productive of yellow sputum, and upper airway congestion. This progressed over the following day and he was noting increasing dyspnea on 12/4 and eventually presented to the ED on 12/5 after he fell at home and had worsening dyspnea. On initial eval he was hypoxic, w/ room air sats 86%, he was febrile and hypotensive w/ initial lactic acid of 2.8. He was given 30 ml/kg crystalloid, cultures sent, abx initiated but remained hypotensive w/ AF w/ RVR. He was therefore admitted to the intensive care under critical care services.   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  PAST SURGICAL HISTORY: He  has past surgical history that  includes Colonoscopy (2004); Colonoscopy (2014); Flexible bronchoscopy (02/2014); Video bronchoscopy (Bilateral, 03/17/2014); Chest tube insertion (Left, 08/26/2014); Cataract extraction (Right, 03/2015); Portacath placement (Right, 03/31/2015); and Removal of pleural drainage catheter (Left, 03/31/2015).  Allergies  Allergen Reactions  . Candesartan Cilexetil     REACTION: Muscle spasms  . Diltiazem Hcl     REACTION: Dizziness  . Doxazosin Mesylate Other (See Comments)    REACTION: H/A's  . Nifedipine     REACTION: Leg swelling  . Pravastatin Other (See Comments)    Muscle aches  . Red Yeast Rice     Muscle aches  . Rosuvastatin Other (See Comments)    REACTION: questionable: severe constipation  . Sertraline Other (See Comments)    oversedation  . Simvastatin Other (See Comments)    REACTION: Muscle aches  . Hydrocodone Other (See Comments)    constipation    No current facility-administered medications on file prior to encounter.   Current Outpatient Prescriptions on File Prior to Encounter  Medication Sig  . albuterol (PROVENTIL HFA;VENTOLIN HFA) 108 (90 BASE) MCG/ACT inhaler Inhale 2 puffs into the lungs every 6 (six) hours as needed for wheezing or shortness of breath.  . Alum & Mag Hydroxide-Simeth (MAGIC MOUTHWASH W/LIDOCAINE) SOLN Take 5 mLs by mouth 3 (three) times daily as needed for mouth pain.  Marland Kitchen dexamethasone (DECADRON) 4 MG tablet 2 tab po bid, the day before, day of and day after chemotherapy every 3 weeks  . diphenoxylate-atropine (LOMOTIL) 2.5-0.025 MG per tablet Take 2 tablets by mouth 4 (four) times daily as needed for diarrhea or loose stools.  Marland Kitchen ELIQUIS 5 MG TABS tablet TAKE 1 TABLET (5 MG TOTAL) BY MOUTH 2 (TWO) TIMES DAILY.  Marland Kitchen  glipiZIDE (GLUCOTROL) 5 MG tablet Take 1 tablet (5 mg total) by mouth daily with breakfast. (Patient taking differently: Take 5 mg by mouth as directed. The day before, the day of, and the day after chemo.)  . guaiFENesin (MUCINEX) 600 MG 12  hr tablet Take 600 mg by mouth 2 (two) times daily as needed for cough or to loosen phlegm.   . hydrocortisone (ANUSOL-HC) 25 MG suppository Place 1 suppository (25 mg total) rectally 2 (two) times daily. (Patient taking differently: Place 25 mg rectally 2 (two) times daily as needed for hemorrhoids. )  . KLOR-CON M10 10 MEQ tablet Take 20 mEq by mouth daily as needed (with torsemide).   Marland Kitchen lidocaine-prilocaine (EMLA) cream Apply 1 application topically as needed.  . loratadine (CLARITIN) 10 MG tablet Take 10 mg by mouth as directed. 3 days before chemo and 4 days after  . metoprolol tartrate (LOPRESSOR) 25 MG tablet TAKE 1 TABLET TWICE A DAY  . Multiple Vitamins-Minerals (ICAPS) CAPS Take 1 capsule by mouth 2 (two) times daily.   . polyethylene glycol (MIRALAX / GLYCOLAX) packet Take 8.5 g by mouth daily as needed for moderate constipation.   . sucralfate (CARAFATE) 1 GM/10ML suspension Take 10 mLs (1 g total) by mouth 4 (four) times daily -  with meals and at bedtime.  . torsemide (DEMADEX) 20 MG tablet Take 20 mg by mouth 2 (two) times daily. Takes one tablet daily and if swelling continues he takes a second pill  . traMADol (ULTRAM) 50 MG tablet Take 1 tablet (50 mg total) by mouth every 6 (six) hours as needed. (Patient taking differently: Take 50 mg by mouth at bedtime. )  . clotrimazole (LOTRIMIN) 1 % cream Apply 1 application topically 2 (two) times daily. (Patient not taking: Reported on 11/27/2015)  . folic acid (FOLVITE) 1 MG tablet TAKE 1 TABLET BY MOUTH EVERY DAY (Patient not taking: Reported on 11/27/2015)  . saccharomyces boulardii (FLORASTOR) 250 MG capsule Take 1 capsule (250 mg total) by mouth 2 (two) times daily. (Patient not taking: Reported on 11/21/2015)    FAMILY HISTORY:  His indicated that his mother is deceased. He indicated that his father is deceased.   SOCIAL HISTORY: He  reports that he quit smoking about 26 years ago. His smoking use included Cigarettes. He has a 90  pack-year smoking history. He has never used smokeless tobacco. He reports that he drinks about 12.6 oz of alcohol per week. He reports that he does not use illicit drugs.  REVIEW OF SYSTEMS:   General: + weakness, + fatigue, + anorexia, + fever and chills. Denied sick exposure, initially but did have GI "bug" in house the week prior to onset.  HENT: + nasal congestion + post-nasal gtt, + sore throat. Pulm: increased exertional dyspnea-->now resting shortness of breath. + cough yellow sputum. + chest pain-->pleuritic in nature involving left chest, no wheeze, audible chest congestion. Card: no palps, + did fall, No CP. Ext: chronic LE edema, no sig change, no pain. Abd: no N/V, some change in stool consistency. + fecal incont w/ cough GU: no issues. Endo: glucose stable. Neuro: no focal issues. M/S: worsening weakness.   SUBJECTIVE:  Fatigued, short of breath   VITAL SIGNS: BP 110/57 mmHg  Pulse 130  Temp(Src) 98.5 F (36.9 C) (Oral)  Resp 36  Ht '5\' 9"'$  (1.753 m)  Wt 81.2 kg (179 lb 0.2 oz)  BMI 26.42 kg/m2  SpO2 97% 3 liters [place] HEMODYNAMICS:    VENTILATOR SETTINGS:  INTAKE / OUTPUT:    PHYSICAL EXAMINATION: General:  Toxic appearing white male, + accessory muscle use w/ mild resting SOB Neuro:  Awake, oriented X3, no focal def  HEENT:  NCAT, + upper airway rhonchi, no JVD  Cardiovascular:  Tachy irreg irreg  Lungs:  Scattered rhonchi, + crackles both bases, paradoxical efforts  Abdomen:  Soft, not tender + bowel sounds  Musculoskeletal:  Generalized weakness. Good tone  Skin:  Warm, dry intact, good pulses.   LABS:  BMET  Recent Labs Lab 11/21/15 1139 11/27/15 0550  NA 141 142  K 4.4 2.1*  CL  --  122*  CO2 25 13*  BUN 19.1 28*  CREATININE 0.9 0.75  GLUCOSE 194* 93    Electrolytes  Recent Labs Lab 11/21/15 1139 11/27/15 0550  CALCIUM 8.6 4.4*    CBC  Recent Labs Lab 11/21/15 1139 11/27/15 0550  WBC 11.9* 0.2*  HGB 11.2* 6.1*  HCT 36.0* 19.1*   PLT 148 18*    Coag's No results for input(s): APTT, INR in the last 168 hours.  Sepsis Markers  Recent Labs Lab 11/27/15 0610  LATICACIDVEN 2.8*    ABG No results for input(s): PHART, PCO2ART, PO2ART in the last 168 hours.  Liver Enzymes  Recent Labs Lab 11/21/15 1139 11/27/15 0550  AST 10 14*  ALT <9 15*  ALKPHOS 73 24*  BILITOT 0.44 0.9  ALBUMIN 2.8* 1.3*    Cardiac Enzymes  Recent Labs Lab 11/27/15 0550  TROPONINI <0.03    Glucose No results for input(s): GLUCAP in the last 168 hours.  Imaging Dg Chest Portable 1 View  11/27/2015  CLINICAL DATA:  74 year old male with shortness of breath, hypoxia and tachycardia EXAM: PORTABLE CHEST 1 VIEW COMPARISON:  Chest radiograph dated 10/17/2015 FINDINGS: Single portable view of the chest demonstrate patchy area of ground-glass and nodular airspace opacity predominantly involving the left mid and lower lung field. There has been interval progression of the airspace opacity compared to prior study. 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 Trace left pleural effusion may present. The cardiac silhouette is within normal limits. Right pectoral Port-A-Cath again noted in stable positioning. There is degenerative changes of the osseous structures. No acute fracture. IMPRESSION: Left mid and lower lung field ground-glass and nodular airspace opacity with interval progression compared to the prior study. Findings concerning for pneumonia. Clinical correlation and follow-up recommended. Electronically Signed   By: Anner Crete M.D.   On: 11/27/2015 06:16   LLL airspace disease   STUDIES:    CULTURES: BCX2 12/5>>> UC 12/5>>> U strep 12/5>>> U legionella 12/5>>> RVP 12/5>>>  ANTIBIOTICS: vanc 12/5>>> Zosyn 12/5>>> SIGNIFICANT EVENTS:   LINES/TUBES: Chronic port   DISCUSSION: 74 year old male w/ stage IV adenocarcinoma of the lung. Pancytopenic/Neutropenic s/p systemic chemo.  Admitting w/ working dx of PNA and sepsis, complicated by AF w/ RVR, metabolic acidosis and shock. Will admit to ICU, cont aggressive resuscitation efforts, broad spec abx, stress dose steroids and supportive care. He is high risk for intubation given his WOB. Discussed goals of care w/ pt and his wife. Would want short term intubation if required. If further access needed we will need to transfuse plts prior to procedure.   ASSESSMENT / PLAN:  PULMONARY A: Acute hypoxic respiratory failure in setting of LLL CAP (in neutropenic host) Stage IV adenocarcinoma of lung  P:   Admit to ICU Pulse ox pulm hygiene  PRN BDs See ID section  High risk  for intubation   CARDIOVASCULAR A:  SIRS/septic shock AF w/ RVR w/ known h/o PAF S/p 30 ml/kg IVF bolus. Remains hypotensive w/ RVR P:  Repeat LA Transfuse (see below) Amiodarone bolus and  Gtt Repeat 12 lead and cycle CEs  Consider additional central access Neo gtt Hold eliquis given thrombocytopenia   RENAL A:   Azotemia  NAG metabolic acidosis in setting of bicarb loss from diarrhea Mild lactic acidosis Severe hypokalemia  Hypocalcemia  P:   IVFs -->change to LR -->avoid bicarb given profound hypokalemia  Aggressively replace KCL  Replace calcium  Ensure adequate volume resuscitation  MAP goal > 65 Pharmacy to adjust for renal fxn    GASTROINTESTINAL A:   Diarrhea  P:   Cl liq w/ meds   HEMATOLOGIC A:   Pancytopenia s/p recent chemo (hgb 6.1; platelets 18) P:  Hold anticoagulation  Transfuse 2 units PRBC  Resume eliquis when plt improve Consider PLT transfusion if procedures required   INFECTIOUS A:   Neutropenic sepsis  LLL PNA  P:   Pan culture  Empiric vanc and zosyn  F/u urine antigens   ENDOCRINE A:  DM P:   ssi  Hold orals   NEUROLOGIC A:   Weakness  P:   RASS goal: n/a Supportive care   FAMILY  - Updates: 12/5  - Inter-disciplinary family meet or Palliative Care meeting due by:   12/12  60 minutes critical care time   Erick Colace ACNP-BC [place] Pager # [phone] OR # [phone] if no answer   11/27/2015, 9:50 AM

## 2015-11-27 NOTE — ED Provider Notes (Signed)
This is a 74 year old man neutropenia who presented earlier today with sepsis. Patient was initially cared for by Dr. Venora Maples. He has received 30 mL per acute oh of IV fluid, antibiotics, and is receiving potassium. On my recheck patient continues tachycardic in the 120s and is becoming increasingly hypotensive. At 08 100 blood pressures 86/54 and at 07/24/1929 blood pressure is 76/40  Sepsis - Repeat Assessment  Performed at:    8:41 AM   Vitals     Blood pressure 86/54, pulse 124, temperature 100.4 F (38 C), temperature source Rectal, resp. rate 30, SpO2 99 %.  Heart:     Irregular rate and rhythm and Tachycardic  Lungs:    decreased bs throughout  Capillary Refill:   <2 sec  Peripheral Pulse:   Radial pulse palpable  Skin:     Pale   Patient to be receiving blood- ordered at 0640 Discussed code status with wife and daughter- full code at this time.  Plan norepi with close monitoring of hr and respiratory status.   Nursing discussed with lab and type and screen is going now. Patient to be transfused 2 units packed red blood cells as soon as possible. Dr. Elsworth Soho is on call for hospitalist and is in the ICU. The patient has an ICU bed ready and will be transported  Pattricia Boss, MD 11/27/15 [phone]

## 2015-11-27 NOTE — Progress Notes (Addendum)
ANTIBIOTIC CONSULT NOTE - INITIAL  Pharmacy Consult for vancomycin, Zosyn Indication: pneumonia  Allergies  Allergen Reactions  . Candesartan Cilexetil     REACTION: Muscle spasms  . Diltiazem Hcl     REACTION: Dizziness  . Doxazosin Mesylate Other (See Comments)    REACTION: H/A's  . Nifedipine     REACTION: Leg swelling  . Pravastatin Other (See Comments)    Muscle aches  . Red Yeast Rice     Muscle aches  . Rosuvastatin Other (See Comments)    REACTION: questionable: severe constipation  . Sertraline Other (See Comments)    oversedation  . Simvastatin Other (See Comments)    REACTION: Muscle aches  . Hydrocodone Other (See Comments)    constipation    Patient Measurements: Height: '5\' 9"'$  (175.3 cm) Weight: 179 lb 0.2 oz (81.2 kg) IBW/kg (Calculated) : 70.7 Vital Signs: Temp: 98.5 F (36.9 C) (12/05 0923) Temp Source: Oral (12/05 0923) BP: 110/57 mmHg (12/05 0945) Pulse Rate: 130 (12/05 0945) Intake/Output from previous day:   Intake/Output from this shift: Total I/O In: 1050 [IV Piggyback:1050] Out: -   Labs:  Recent Labs  11/27/15 0550  WBC 0.2*  HGB 6.1*  PLT 18*  CREATININE 0.75   Estimated Creatinine Clearance: 79.8 mL/min (by C-G formula based on Cr of 0.75). No results for input(s): VANCOTROUGH, VANCOPEAK, VANCORANDOM, GENTTROUGH, GENTPEAK, GENTRANDOM, TOBRATROUGH, TOBRAPEAK, TOBRARND, AMIKACINPEAK, AMIKACINTROU, AMIKACIN in the last 72 hours.   Microbiology: No results found for this or any previous visit (from the past 720 hour(s)).  Medical History: Past Medical History  Diagnosis Date  . History of diabetes mellitus, type II     resolved with diet, h/o neuropathy  . Diverticulosis of colon   . Hyperlipidemia   . Hypertension   . Fatty liver 02/29/00    abd ultrasound  . Lower back pain   . Systolic murmur 5053    2Decho - normal LV fxn, EF 55%, mild AS, biatrial enlargement  . History of tobacco use quit 1990s  . Positive hepatitis  C antibody test 2013    HCV RNA negative - ?cleared infection  . 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 Personal history of colonic adenomas 07/06/2013  . Hypertensive retinopathy of both eyes 2015    mild  . Dysrhythmia     Atrial fib (found 07/2014)  . Shortness of breath   . Pneumonia   . GERD (gastroesophageal reflux disease)     occasional  . Arthritis   . Neuropathy ([place])   . Constipation   . 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 Diabetes mellitus without complication ([facility])     Type 2  . Depression   . Non-small cell carcinoma of left lung ([place]) 02/2014    stage IIIb/IV on chemo  . Full code status 09/26/2015  . Atrial fibrillation ([facility])     Medications:  Scheduled:  . norepinephrine (LEVOPHED) Adult infusion  0-40 mcg/min Intravenous Once  . pantoprazole (PROTONIX) IV  40 mg Intravenous QHS  . piperacillin-tazobactam (ZOSYN)  IV  3.375 g Intravenous Once   Infusions:  . sodium chloride 100 mL/hr at 11/27/15 0944  . sodium chloride 10 mL/hr at 11/27/15 0933  . phenylephrine (NEO-SYNEPHRINE) Adult infusion Stopped (11/27/15 0946)   Assessment: 74 yo male presented to ER now in ICU with acute hypoxic respiratory failure, fever, sputum production. Patient with stage IV NSCLC currently completed 6  cycles of docetaxol/Cyramza. Now to start vancomycin and Zosyn for presumed HAP and febrile neutropenia. Baseline labs: Tmax 100.4, WBC 0.2 with ANC of 0 (Note Neulasta given 12/2) and SCr 0.75 with est CrCl 80 ml/min CG  Goal of Therapy:  Vancomycin trough level 15-20 mcg/ml  Plan:  1) Vancomycin 1g IV x 1 then 1g IV q12 based on current renal function and weight 2) Zosyn 3.375g IV q8 (extended interval infusion) for CrCl > 20 ml/min   Adrian Saran, PharmD, BCPS Pager [phone] 11/27/2015 10:04 AM

## 2015-11-27 NOTE — ED Notes (Signed)
EDP at bedside  

## 2015-11-27 NOTE — Progress Notes (Signed)
[facility] Progress Note Patient Name: Shawn Espinoza DOB: 1941-01-31 MRN: [id number]   Date of Service  11/27/2015  HPI/Events of Note  Does not like liquid potassium.  eICU Interventions  K-dur ordered.     Intervention Category Major Interventions: Other:  YACOUB,WESAM 11/27/2015, 4:32 PM

## 2015-11-28 ENCOUNTER — Other Ambulatory Visit: Payer: Medicare Other

## 2015-11-28 ENCOUNTER — Inpatient Hospital Stay (HOSPITAL_COMMUNITY): Payer: Medicare Other

## 2015-11-28 DIAGNOSIS — D61810 Antineoplastic chemotherapy induced pancytopenia: Secondary | ICD-10-CM

## 2015-11-28 LAB — BASIC METABOLIC PANEL
ANION GAP: 7 (ref 5–15)
BUN: 41 mg/dL — ABNORMAL HIGH (ref 6–20)
CALCIUM: 7.6 mg/dL — AB (ref 8.9–10.3)
CO2: 24 mmol/L (ref 22–32)
CREATININE: 1.1 mg/dL (ref 0.61–1.24)
Chloride: 106 mmol/L (ref 101–111)
Glucose, Bld: 195 mg/dL — ABNORMAL HIGH (ref 65–99)
Potassium: 5.2 mmol/L — ABNORMAL HIGH (ref 3.5–5.1)
SODIUM: 137 mmol/L (ref 135–145)

## 2015-11-28 LAB — CBC
HEMATOCRIT: 33.6 % — AB (ref 39.0–52.0)
Hemoglobin: 10.9 g/dL — ABNORMAL LOW (ref 13.0–17.0)
MCH: 29.3 pg (ref 26.0–34.0)
MCHC: 32.4 g/dL (ref 30.0–36.0)
MCV: 90.3 fL (ref 78.0–100.0)
Platelets: 27 10*3/uL — CL (ref 150–400)
RBC: 3.72 MIL/uL — AB (ref 4.22–5.81)
RDW: 17.2 % — AB (ref 11.5–15.5)
WBC: 0.1 10*3/uL — AB (ref 4.0–10.5)

## 2015-11-28 LAB — TYPE AND SCREEN
ABO/RH(D): A NEG
ANTIBODY SCREEN: NEGATIVE
Unit division: 0
Unit division: 0

## 2015-11-28 LAB — TROPONIN I: Troponin I: <0.03 ng/mL (ref <0.031)

## 2015-11-28 LAB — PREPARE PLATELET PHERESIS: Unit division: 0

## 2015-11-28 LAB — RESPIRATORY VIRUS PANEL
Adenovirus: NEGATIVE
INFLUENZA A: NEGATIVE
INFLUENZA B 1: NEGATIVE
Metapneumovirus: NEGATIVE
PARAINFLUENZA 1 A: NEGATIVE
PARAINFLUENZA 2 A: NEGATIVE
Parainfluenza 3: NEGATIVE
RESPIRATORY SYNCYTIAL VIRUS A: NEGATIVE
RESPIRATORY SYNCYTIAL VIRUS B: NEGATIVE
Rhinovirus: NEGATIVE

## 2015-11-28 LAB — GLUCOSE, CAPILLARY
GLUCOSE-CAPILLARY: 186 mg/dL — AB (ref 65–99)
GLUCOSE-CAPILLARY: 203 mg/dL — AB (ref 65–99)
Glucose-Capillary: 158 mg/dL — ABNORMAL HIGH (ref 65–99)
Glucose-Capillary: 160 mg/dL — ABNORMAL HIGH (ref 65–99)
Glucose-Capillary: 190 mg/dL — ABNORMAL HIGH (ref 65–99)

## 2015-11-28 LAB — URINE CULTURE: Culture: NO GROWTH

## 2015-11-28 LAB — PHOSPHORUS: PHOSPHORUS: 2.5 mg/dL (ref 2.5–4.6)

## 2015-11-28 LAB — MAGNESIUM: MAGNESIUM: 1.5 mg/dL — AB (ref 1.7–2.4)

## 2015-11-28 LAB — VANCOMYCIN, TROUGH: Vancomycin Tr: 23 ug/mL — ABNORMAL HIGH (ref 10.0–20.0)

## 2015-11-28 MED ORDER — CETYLPYRIDINIUM CHLORIDE 0.05 % MT LIQD
7.0000 mL | Freq: Two times a day (BID) | OROMUCOSAL | Status: DC
  Administered 2015-11-28 – 2015-12-03 (×10): 7 mL via OROMUCOSAL

## 2015-11-28 MED ORDER — METOPROLOL TARTRATE 1 MG/ML IV SOLN: 2.5000 mg | INTRAVENOUS | Status: DC | PRN

## 2015-11-28 MED ORDER — MAGNESIUM SULFATE 4 GM/100ML IV SOLN
4.0000 g | Freq: Once | INTRAVENOUS | Status: AC
  Administered 2015-11-28: 4 g via INTRAVENOUS
  Filled 2015-11-28: qty 100

## 2015-11-28 MED ORDER — METOPROLOL TARTRATE 25 MG PO TABS
12.5000 mg | ORAL_TABLET | Freq: Two times a day (BID) | ORAL | Status: DC
  Administered 2015-11-28 – 2015-12-04 (×13): 12.5 mg via ORAL
  Filled 2015-11-28 (×13): qty 1

## 2015-11-28 MED ORDER — TORSEMIDE 20 MG PO TABS
20.0000 mg | ORAL_TABLET | Freq: Every day | ORAL | Status: DC
  Administered 2015-11-28 – 2015-11-30 (×3): 20 mg via ORAL
  Filled 2015-11-28 (×4): qty 1

## 2015-11-28 MED ORDER — PANTOPRAZOLE SODIUM 40 MG PO TBEC
40.0000 mg | DELAYED_RELEASE_TABLET | Freq: Every day | ORAL | Status: DC
  Administered 2015-11-28 – 2015-12-04 (×7): 40 mg via ORAL
  Filled 2015-11-28 (×7): qty 1

## 2015-11-28 NOTE — Progress Notes (Signed)
Inpatient Diabetes Program Recommendations  AACE/ADA: New Consensus Statement on Inpatient Glycemic Control (2015)  Target Ranges:  Prepandial:   less than 140 mg/dL      Peak postprandial:   less than 180 mg/dL (1-2 hours)      Critically ill patients:  140 - 180 mg/dL   Results for Shawn Espinoza, Shawn Espinoza (MRN [id number]) as of 11/28/2015 07:56  Ref. Range 11/27/2015 12:17 11/27/2015 16:03 11/27/2015 19:44 11/28/2015 00:24  Glucose-Capillary Latest Ref Range: 65-99 mg/dL 128 (H) 183 (H) 215 (H) 203 (H)    Admit with: Respiratory Failure/ Sepsis  History: DM, Stage 4 Lung Cancer  Home DM Meds: Glipizide 5 mg (the day before, the day of, and the day after chemotherapy)  Current Insulin Orders: None      -Note patient currently receiving IV Solucortef 50 mg Q6 hours.  -No orders for CBG checks or Novolog SSI at present.     MD- Please start Novolog Moderate SSI (0-15 units) Q4 hours      --Will follow patient during hospitalization--  Wyn Quaker RN, MSN, CDE Diabetes Coordinator Inpatient Glycemic Control Team Team Pager: [phone] (8a-5p)

## 2015-11-28 NOTE — Progress Notes (Signed)
ANTIBIOTIC CONSULT NOTE - follow up  Pharmacy Consult for vancomycin, Zosyn Indication: pneumonia  Allergies  Allergen Reactions  . Candesartan Cilexetil     REACTION: Muscle spasms  . Diltiazem Hcl     REACTION: Dizziness  . Doxazosin Mesylate Other (See Comments)    REACTION: H/A's  . Nifedipine     REACTION: Leg swelling  . Pravastatin Other (See Comments)    Muscle aches  . Red Yeast Rice     Muscle aches  . Rosuvastatin Other (See Comments)    REACTION: questionable: severe constipation  . Sertraline Other (See Comments)    oversedation  . Simvastatin Other (See Comments)    REACTION: Muscle aches  . Hydrocodone Other (See Comments)    constipation    Patient Measurements: Height: '5\' 9"'$  (175.3 cm) Weight: 185 lb 10 oz (84.2 kg) IBW/kg (Calculated) : 70.7 Vital Signs: Temp: 97.4 F (36.3 C) (12/06 0747) Temp Source: Axillary (12/06 0747) BP: 108/91 mmHg (12/06 0800) Pulse Rate: 94 (12/06 0800) Intake/Output from previous day: 12/05 0701 - 12/06 0700 In: 7947.7 [P.O.:240; I.V.:5178.2; Blood:1117; IV Piggyback:1412.5] Out: 525 [Urine:525] Intake/Output from this shift: Total I/O In: 581.7 [P.O.:240; I.V.:91.7; IV Piggyback:250] Out: 75 [Urine:75]  Labs:  Recent Labs  11/27/15 0550 11/27/15 1654 11/28/15 0740  WBC 0.2*  --  0.1*  HGB 6.1*  --  10.9*  PLT 18*  --  27*  CREATININE 0.75 1.32* 1.10   Estimated Creatinine Clearance: 58 mL/min (by C-G formula based on Cr of 1.1). No results for input(s): VANCOTROUGH, VANCOPEAK, VANCORANDOM, GENTTROUGH, GENTPEAK, GENTRANDOM, TOBRATROUGH, TOBRAPEAK, TOBRARND, AMIKACINPEAK, AMIKACINTROU, AMIKACIN in the last 72 hours.   Microbiology: Recent Results (from the past 720 hour(s))  Urine culture     Status: None   Collection Time: 11/27/15  5:57 AM  Result Value Ref Range Status   Specimen Description URINE, CATHETERIZED  Final   Special Requests NONE  Final   Culture   Final    NO GROWTH 1 DAY Performed at  [facility]    Report Status 11/28/2015 FINAL  Final  MRSA PCR Screening     Status: None   Collection Time: 11/27/15  9:25 AM  Result Value Ref Range Status   MRSA by PCR NEGATIVE NEGATIVE Final    Comment:        The GeneXpert MRSA Assay (FDA approved for NASAL specimens only), is one component of a comprehensive MRSA colonization surveillance program. It is not intended to diagnose MRSA infection nor to guide or monitor treatment for MRSA infections.     Medical History: Past Medical History  Diagnosis Date  . History of diabetes mellitus, type II     resolved with diet, h/o neuropathy  . Diverticulosis of colon   . Hyperlipidemia   . Hypertension   . Fatty liver 02/29/00    abd ultrasound  . Lower back pain   . Systolic murmur 6269    2Decho - normal LV fxn, EF 55%, mild AS, biatrial enlargement  . History of tobacco use quit 1990s  . 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 Personal history of colonic adenomas 07/06/2013  . Hypertensive retinopathy of both eyes 2015    mild  . Dysrhythmia     Atrial fib (found 07/2014)  . Shortness of breath   . Pneumonia   . GERD (gastroesophageal reflux  disease)     occasional  . Arthritis   . Neuropathy ([place])   . Constipation   . 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 Diabetes mellitus without complication ([facility])     Type 2  . Depression   . Non-small cell carcinoma of left lung ([place]) 02/2014    stage IIIb/IV on chemo  . Full code status 09/26/2015  . Atrial fibrillation ([facility])     Medications:  Scheduled:  . antiseptic oral rinse  7 mL Mouth Rinse BID  . hydrocortisone sod succinate (SOLU-CORTEF) inj  50 mg Intravenous Q6H  . pantoprazole (PROTONIX) IV  40 mg Intravenous QHS  . piperacillin-tazobactam (ZOSYN)  IV  3.375 g Intravenous Q8H  . vancomycin  1,000 mg  Intravenous Q12H   Infusions:  . sodium chloride 10 mL/hr at 11/27/15 0933  . amiodarone 30 mg/hr (11/28/15 0525)  . lactated ringers 1,000 mL (11/27/15 2017)  . phenylephrine (NEO-SYNEPHRINE) Adult infusion Stopped (11/27/15 1405)   Assessment: 74 yo male presented to ER now in ICU with acute hypoxic respiratory failure, fever, sputum production. Patient with stage IV NSCLC currently completed 6 cycles of docetaxol/Cyramza. Now to start vancomycin and Zosyn for presumed HAP and febrile neutropenia. Baseline labs: Tmax 100.4, WBC 0.2 with ANC of 0 (Note Neulasta given 12/2) and SCr 0.75 with est CrCl 80 ml/min CG  - Tmax 100.9 on 12/5 afeb since - WBC 0.1 - SCr elevated compared to when vanc was started with est CrCl now 58 ml/min  Goal of Therapy:  Vancomycin trough level 15-20 mcg/ml  Plan:  1) Continue vanc 1g q12 for now but will check trough prior to next dose at 8pm (prior to 4th dose) due to rise in SCr 2) Zosyn 3.375g IV q8 (extended interval infusion) for CrCl > 20 ml/min   Adrian Saran, PharmD, BCPS Pager [phone] 11/28/2015 9:37 AM

## 2015-11-28 NOTE — Progress Notes (Signed)
PHARMACY - VANCOMYCIN (brief note)  Patient on IV Vancomycin 1gm IV q12h and Zosyn 3.375gm IV q8h for treatment of pneumonia (Day #2).  Scr has fluctuated over the past 2 days:  12/5 @ 05:50 = 0.75  12/5 @ 16:54 = 1.32  12/6 @ 07:40 - 1.10  Due to the bump in Scr, Vancomycin trough level drawn tonight prior to the 4th dose.  Vancomycin trough level = 23 mcg/ml (Goal 15-20 mcg/ml) Vancomycin 1gm dose was started @ 20:45.  Lab resulted @ 20:51 and RN contacted to stop current Vancomycin dose due to elevated level  Plan:  Hold Vancomycin            Check random vancomycin level in the AM            Resume vancomycin once level < 20 mcg/ml  Leone Haven, PharmD 11/28/15 @ 21:06

## 2015-11-28 NOTE — Progress Notes (Signed)
PULMONARY / CRITICAL CARE MEDICINE   Name: Shawn Espinoza MRN: [id number] DOB: 1941-04-19    ADMISSION DATE:  11/27/2015 CONSULTATION DATE:  12/5  REFERRING MD:  [name]   CHIEF COMPLAINT:  Acute hypoxic respiratory failure   Subjective  74 YO WM with a h/o stage IV adenocarcinoma of the lung admitted with acute respiratory failure and CAP. Reports feeling much better today. Still get dyspneic with mild-moderate exertion. Reports persistent productive cough with yellow sputum. Denies fever, chill, chest pain and hemoptysis. His oxygen saturation was stable overnight between 96-98% on 2L [place]. BP and HR much improved. He is off pressor support. Appetite is still poor but he is tolerating liquids.   REVIEW OF SYSTEMS:   General: + weakness, + fatigue, + anorexia, but negative for pain fever and chills. HENT: Reports improved nasal congestion  Pulm: Persistent exertional dyspnea and cough  Ext: chronic LE edema, no sig change.  GI: no N/V, no BM x 24h.  Endo: Denies polyuria and poyldipsia  Neuro: Denies headache, visual and speech changes.  M/S: Persistent  weakness.   SUBJECTIVE:  Lying in bed, mild respiratory distress at rest   VITAL SIGNS: BP 108/91 mmHg  Pulse 94  Temp(Src) 97.4 F (36.3 C) (Axillary)  Resp 28  Ht '5\' 9"'$  (1.753 m)  Wt 84.2 kg (185 lb 10 oz)  BMI 27.40 kg/m2  SpO2 97% 3 liters [place] HEMODYNAMICS:    VENTILATOR SETTINGS: n/a    INTAKE / OUTPUT: I/O last 3 completed shifts: In: 7947.7 [P.O.:240; I.V.:5178.2; Blood:1117; IV Piggyback:1412.5] Out: 525 [Urine:525]  PHYSICAL EXAMINATION: General: Appear acutely ill, +accessory muscle use w/ mild resting SOB Neuro:  Awake, oriented X3, moves all 4 extremities; no focal def  HEENT:  NCAT, + upper airway rhonchi, no JVD  Cardiovascular:  HR 98 bmp; irregular; S1/S2; no murmur; +2 pulses; +1 edema Lungs:  Scattered rhonchi, + crackles both bases, paradoxical efforts  Abdomen:  Mild abdominal tenderness on  palpation, + bowel sounds  Musculoskeletal:  Generalized weakness. Good tone; no visible deformities  Skin:  Warm, dry intact, good pulses.   LABS:  BMET  Recent Labs Lab 11/27/15 0550 11/27/15 1654 11/28/15 0740  NA 142 135 137  K 2.1* 5.1 5.2*  CL 122* 105 106  CO2 13* 23 24  BUN 28* 42* 41*  CREATININE 0.75 1.32* 1.10  GLUCOSE 93 219* 195*    Electrolytes  Recent Labs Lab 11/27/15 0550 11/27/15 1654 11/28/15 0740  CALCIUM 4.4* 7.3* 7.6*  MG 0.8*  --  1.5*  PHOS  --   --  2.5    CBC  Recent Labs Lab 11/21/15 1139 11/27/15 0550 11/28/15 0740  WBC 11.9* 0.2* 0.1*  HGB 11.2* 6.1* 10.9*  HCT 36.0* 19.1* 33.6*  PLT 148 18* 27*    Coag's No results for input(s): APTT, INR in the last 168 hours.  Sepsis Markers  Recent Labs Lab 11/27/15 0610 11/27/15 0909  LATICACIDVEN 2.8* 2.7*    ABG No results for input(s): PHART, PCO2ART, PO2ART in the last 168 hours.  Liver Enzymes  Recent Labs Lab 11/21/15 1139 11/27/15 0550  AST 10 14*  ALT <9 15*  ALKPHOS 73 24*  BILITOT 0.44 0.9  ALBUMIN 2.8* 1.3*    Cardiac Enzymes  Recent Labs Lab 11/27/15 0550 11/27/15 1654 11/27/15 2244  TROPONINI <0.03 <0.03 <0.03    Glucose  Recent Labs Lab 11/27/15 1217 11/27/15 1603 11/27/15 1944 11/28/15 0024  GLUCAP 128* 183* 215* 203*  Imaging Dg Chest Port 1 View  11/28/2015  CLINICAL DATA:  Pneumonia.  Hypoxia. EXAM: PORTABLE CHEST 1 VIEW COMPARISON:  11/27/2015. FINDINGS: Interim placement of right IJ sheath. Power port catheter stable position. Heart size stable. Left mid lung and bibasilar pulmonary infiltrates are present. Low lung volumes with bibasilar atelectasis. Mild left pleural effusion. No pneumothorax. IMPRESSION: 1. Interim placement of right IJ sheath. PowerPort catheter in stable position. 2. Left mid lung field and bibasilar pulmonary infiltrates most consistent with pneumonia. Pulmonary edema cannot be excluded. Small left pleural  effusion. 3. Lung volumes with bibasilar atelectasis. Electronically Signed   By: Marcello Moores  Register   On: 11/28/2015 07:07    CULTURES: Results pending BCX2 12/5>>> UC 12/5>>> U strep 12/5>>> U legionella 12/5>>> RVP 12/5>>>  ANTIBIOTICS: vanc 12/5>>> Zosyn 12/5>>> SIGNIFICANT EVENTS:   LINES/TUBES: Chronic port accessed 12/5  DISCUSSION: 74 year old male w/ stage IV adenocarcinoma of the lung s/p systemic chemo admitted  with CAP, sepsis and pancytopenic/Neutropenic. Overall, patient is hemodynamically stable this morning with moderate improvement in WOB. Remains on empiric antibiotics. Cultures pending. Will continue broad spec abx, stress dose steroids and supportive care.   ASSESSMENT / PLAN:  PULMONARY A: Acute hypoxic respiratory failure in setting of LLL CAP (in neutropenic host) Stage IV adenocarcinoma of lung  P:   Pulse ox pulm hygiene  PRN BDs See ID section  High risk for intubation See oncology section  CARDIOVASCULAR A:  SIRS/septic shock AF w/ RVR w/ known h/o PAF  P:  Repeat LA Continue Amiodarone Gtt Repeat 12 lead and cycle CEs   Hold eliquis given thrombocytopenia  Continue IV fluids Off pressor support; BP stable  RENAL A:   Azotemia  NAG metabolic acidosis in setting of bicarb loss from diarrhea Mild lactic acidosis Severe hypokalemia  Hypocalcemia  P:   IVFs -->change to LR -->avoid bicarb given profound hypokalemia  Aggressively replace K+, magnesium and calcium Ensure adequate volume resuscitation  MAP goal > 65 Pharmacy to adjust antibiotics for renal fxn    GASTROINTESTINAL A:   Diarrhea-Resolved P:   Progress diet as tolerated   HEMATOLOGIC A:   Pancytopenia s/p recent chemo (hgb 6.1; platelets 18) P:  Hold anticoagulation  Transfused 2 units PRBC; Hg 10.9 Resume eliquis when plt improve Consider PLT transfusion if procedures required   INFECTIOUS A:   Neutropenic sepsis  LLL PNA  P:   Pan cultures  pending Continue empiric vanc and zosyn  F/u urine antigens   ENDOCRINE A:  DM P:   ssi  Hold orals  Until oral intake is optimal  NEUROLOGIC A:   Weakness  P:   RASS goal: n/a Supportive care   FAMILY  - Updates: 12/5  - Inter-disciplinary family meet or Palliative Care meeting due by:  12/12   Magdalene S. Patria Mane  Pager [phone]   ATTENDING NOTE: I have personally reviewed patient's available data, including medical history, events of note, physical examination and test results as part of my evaluation. I have discussed with resident/NP and other careteam providers such as pharmacist, RN and RRT & co-ordinated with consultants.   He has stage IV adenocarcinoma diagnosed in 02/2014. He had disease progression despite first round chemotherapy and immunotherapy. He received 6 cycles of second line chemotherapy-last was on 11/29 with Neulasta injection on 12/2. He has chronic atrial fibrillation on anticoagulation. He was admitted with sudden onset fever and chills and productive cough, hypoxia and the left lower lobe infiltrate, labs showed pancytopenia  and a lactate of 2.8  Exam- more alert, non-focal, crackles left base, no rhonchi, no edema, S1 and S2 tachycardia, irregular on amiodarone drip  Impression/plan- Septic shock- resolved, off pressors HCAP - broad-spectrum antibiotics, treat for 10 days-pro-calcitonin not useful in this setting Pancytopenia- receive Neulasta on 12/2 Chronic atrial fibrillation- can use Lopressor for rate control now that blood pressure improved, discontinue amiodarone  Rest per NP/medical resident whose note is outlined above and that I agree with and edited in full.   The patient is critically ill with multiple organ systems failure and requires high complexity decision making for assessment and support, frequent evaluation and titration of therapies, application of advanced monitoring technologies and extensive interpretation of multiple  databases. Critical Care Time devoted to patient care services described in this note independent of APP time is 31 minutes.   Rigoberto Noel MD  11/28/2015, 9:50 AM

## 2015-11-28 NOTE — Care Management Note (Signed)
Case Management Note  Patient Details  Name: Shawn Espinoza MRN: [id number] Date of Birth: 08-12-1941  Subjective/Objective:      A.fib and hypotensive state              Action/Plan:Date: November 28, 2015 Chart reviewed for concurrent status and case management needs. Will continue to follow patient for changes and needs: Velva Harman, RN, BSN, [place]   [phone]   Expected Discharge Date:   (UNKNOWN)               Expected Discharge Plan:  Home/Self Care  In-House Referral:  NA  Discharge planning Services  CM Consult  Post Acute Care Choice:  NA Choice offered to:  NA  DME Arranged:    DME Agency:     HH Arranged:    [place] Agency:     Status of Service:  Completed, signed off  Medicare Important Message Given:    Date Medicare IM Given:    Medicare IM give by:    Date Additional Medicare IM Given:    Additional Medicare Important Message give by:     If discussed at [place] of Stay Meetings, dates discussed:    Additional Comments:  Leeroy Cha, RN 11/28/2015, 10:24 AM

## 2015-11-29 DIAGNOSIS — C3492 Malignant neoplasm of unspecified part of left bronchus or lung: Secondary | ICD-10-CM

## 2015-11-29 DIAGNOSIS — B370 Candidal stomatitis: Secondary | ICD-10-CM

## 2015-11-29 DIAGNOSIS — R894 Abnormal immunological findings in specimens from other organs, systems and tissues: Secondary | ICD-10-CM

## 2015-11-29 DIAGNOSIS — Z7901 Long term (current) use of anticoagulants: Secondary | ICD-10-CM

## 2015-11-29 LAB — VANCOMYCIN, RANDOM: VANCOMYCIN RM: 31 ug/mL

## 2015-11-29 LAB — BASIC METABOLIC PANEL
ANION GAP: 7 (ref 5–15)
BUN: 44 mg/dL — ABNORMAL HIGH (ref 6–20)
CALCIUM: 7.9 mg/dL — AB (ref 8.9–10.3)
CHLORIDE: 106 mmol/L (ref 101–111)
CO2: 24 mmol/L (ref 22–32)
Creatinine, Ser: 1.27 mg/dL — ABNORMAL HIGH (ref 0.61–1.24)
GFR calc non Af Amer: 54 mL/min — ABNORMAL LOW (ref >60)
Glucose, Bld: 181 mg/dL — ABNORMAL HIGH (ref 65–99)
Potassium: 4.3 mmol/L (ref 3.5–5.1)
Sodium: 137 mmol/L (ref 135–145)

## 2015-11-29 LAB — CBC WITH DIFFERENTIAL/PLATELET
BASOS ABS: 0 10*3/uL (ref 0.0–0.1)
Basophils Relative: 1 %
EOS PCT: 0 %
Eosinophils Absolute: 0 10*3/uL (ref 0.0–0.7)
HCT: 34.3 % — ABNORMAL LOW (ref 39.0–52.0)
Hemoglobin: 11.3 g/dL — ABNORMAL LOW (ref 13.0–17.0)
Lymphocytes Relative: 14 %
Lymphs Abs: 0.2 10*3/uL — ABNORMAL LOW (ref 0.7–4.0)
MCH: 29 pg (ref 26.0–34.0)
MCHC: 32.9 g/dL (ref 30.0–36.0)
MCV: 88.2 fL (ref 78.0–100.0)
MONO ABS: 0.3 10*3/uL (ref 0.1–1.0)
Monocytes Relative: 20 %
NEUTROS PCT: 65 %
Neutro Abs: 0.9 10*3/uL — ABNORMAL LOW (ref 1.7–7.7)
PLATELETS: 37 10*3/uL — AB (ref 150–400)
RBC: 3.89 MIL/uL — AB (ref 4.22–5.81)
RDW: 16.6 % — ABNORMAL HIGH (ref 11.5–15.5)
WBC: 1.4 10*3/uL — AB (ref 4.0–10.5)

## 2015-11-29 LAB — MAGNESIUM: Magnesium: 2.2 mg/dL (ref 1.7–2.4)

## 2015-11-29 MED ORDER — SODIUM CHLORIDE 0.9 % IJ SOLN
10.0000 mL | INTRAMUSCULAR | Status: DC | PRN
  Administered 2015-11-29 (×2): 10 mL
  Administered 2015-11-30: 20 mL
  Administered 2015-12-01 – 2015-12-03 (×2): 10 mL
  Filled 2015-11-29 (×5): qty 40

## 2015-11-29 MED ORDER — TRAMADOL HCL 50 MG PO TABS
50.0000 mg | ORAL_TABLET | Freq: Four times a day (QID) | ORAL | Status: AC | PRN
  Administered 2015-11-30 – 2015-12-01 (×2): 50 mg via ORAL
  Filled 2015-11-29 (×2): qty 1

## 2015-11-29 MED ORDER — ALBUTEROL SULFATE (2.5 MG/3ML) 0.083% IN NEBU
2.5000 mg | INHALATION_SOLUTION | Freq: Three times a day (TID) | RESPIRATORY_TRACT | Status: DC
  Administered 2015-11-29 – 2015-11-30 (×2): 2.5 mg via RESPIRATORY_TRACT
  Filled 2015-11-29 (×4): qty 3

## 2015-11-29 MED ORDER — MAGIC MOUTHWASH W/LIDOCAINE
5.0000 mL | Freq: Three times a day (TID) | ORAL | Status: DC
  Administered 2015-11-29 (×3): 5 mL via ORAL
  Filled 2015-11-29 (×18): qty 5

## 2015-11-29 NOTE — Progress Notes (Signed)
[facility] PROGRESS NOTE   Shawn Espinoza BJY:[id number] DOB: [birth year] DOA: 11/27/2015 PCP: [name], MD  HPI/Subjective: Feels better, denies any shortness of breath. Has cough with minimal yellow sputum production. Transferred from the ICU 12/6, [facility] to assume care on 12/7.  Assessment/Plan: Principal Problem:   Septic shock (HCC) Active Problems:   Positive hepatitis C antibody test   Adenocarcinoma of left lung, stage 4 (HCC)   Thrush   Antineoplastic chemotherapy induced pancytopenia ([place])   Long term current use of anticoagulant therapy   HCAP (healthcare-associated pneumonia)    Severe sepsis Admitted initially to the ICU with low blood pressure of 88/47, has fever and HCAP. Did not require this pressor support, the hypotension improved after IV fluid hydration. This is treated with IV antibiotics and aggressive hydration with IV fluids. Sepsis physiology resolved.  HCAP Healthcare associated pneumonia with associated neutropenia/lymphopenia. Chest x-ray showed left-sided and bibasilar infiltrates consistent with pneumonia. Patient started on vancomycin and Zosyn on admission to the hospital. He is improving, still has SOB and purulent sputum.  Pancytopenia Secondary to chemotherapy, patient receiving chemotherapy for stage IV lung cancer. Presented with WBC of 0.2, hemoglobin of 6.1 and platelets of 18. Overall improving. Patient received Neulasta after the chemotherapy.  Leukopenia With neutropenia and lymphopenia, secondary to recent chemotherapy. Patient received Neulasta after the chemotherapy, ANC was 0 on admission currently 0.9. Check CBC with differential in a.m.  Atrial fibrillation Patient has paroxysmal atrial fibrillation, had brief RVR on admission. Patient was on amiodarone drip, likely this was secondary to hypotension and sepsis. Patient is on Eliquis, currently on hold because of thrombocytopenia, will restart  anticoagulation after platelets above 50. This patients CHA2DS2-VASc Score and unadjusted Ischemic Stroke Rate (% per year) is equal to 3.2 % stroke rate/year from a score of 3 for diabetes and 2 points for age >87. Above score calculated as 1 point each if present [CHF, HTN, DM, Vascular=MI/PAD/Aortic Plaque, Age if 65-74, or Male] Above score calculated as 2 points each if present [Age > 75, or Stroke/TIA/TE]  Stage IV adenocarcinoma of the lung Patient follows with Dr. [name], currently receiving palliative chemotherapy.   Code Status: Full Code Family Communication: Plan discussed with the patient. Disposition Plan: Remains inpatient Diet: Diet Carb Modified Fluid consistency:: Thin; Room service appropriate?: Yes  Consultants:  None  Procedures:  None  Antibiotics:  None   Objective: Filed Vitals:   11/29/15 0512 11/29/15 1124  BP: 151/54 118/71  Pulse: 98 83  Temp: 97.6 F (36.4 C)   Resp: 28     Intake/Output Summary (Last 24 hours) at 11/29/15 1410 Last data filed at 11/29/15 0900  Gross per 24 hour  Intake 757.67 ml  Output    575 ml  Net 182.67 ml   Filed Weights   11/27/15 0923 11/28/15 0440 11/29/15 0512  Weight: 81.2 kg (179 lb 0.2 oz) 84.2 kg (185 lb 10 oz) 84.2 kg (185 lb 10 oz)    Exam: General: Alert and awake, oriented x3, not in any acute distress. HEENT: anicteric sclera, pupils reactive to light and accommodation, EOMI CVS: S1-S2 clear, no murmur rubs or gallops Chest: clear to auscultation bilaterally, no wheezing, rales or rhonchi Abdomen: soft nontender, nondistended, normal bowel sounds, no organomegaly Extremities: no cyanosis, clubbing or edema noted bilaterally Neuro: Cranial nerves II-XII intact, no focal neurological deficits  Data Reviewed: Basic Metabolic Panel:  Recent Labs Lab 11/27/15 0550 11/27/15 1654 11/28/15 0740 11/29/15 0600  NA 142 135 137  137  K 2.1* 5.1 5.2* 4.3  CL 122* 105 106 106  CO2 13* '23 24 24    '$ GLUCOSE 93 219* 195* 181*  BUN 28* 42* 41* 44*  CREATININE 0.75 1.32* 1.10 1.27*  CALCIUM 4.4* 7.3* 7.6* 7.9*  MG 0.8*  --  1.5* 2.2  PHOS  --   --  2.5  --    Liver Function Tests:  Recent Labs Lab 11/27/15 0550  AST 14*  ALT 15*  ALKPHOS 24*  BILITOT 0.9  PROT <3.0*  ALBUMIN 1.3*   No results for input(s): LIPASE, AMYLASE in the last 168 hours. No results for input(s): AMMONIA in the last 168 hours. CBC:  Recent Labs Lab 11/27/15 0550 11/28/15 0740 11/29/15 0600  WBC 0.2* 0.1* 1.4*  NEUTROABS 0.0*  --  0.9*  HGB 6.1* 10.9* 11.3*  HCT 19.1* 33.6* 34.3*  MCV 92.7 90.3 88.2  PLT 18* 27* 37*   Cardiac Enzymes:  Recent Labs Lab 11/27/15 0550 11/27/15 1654 11/27/15 2244  TROPONINI <0.03 <0.03 <0.03   BNP (last 3 results)  Recent Labs  11/27/15 0550  BNP 128.7*    ProBNP (last 3 results)  Recent Labs  08/18/15 1245  PROBNP 222.0*    CBG:  Recent Labs Lab 11/28/15 0024 11/28/15 0612 11/28/15 0744 11/28/15 1228 11/28/15 1704  GLUCAP 203* 158* 160* 190* 186*    Micro Recent Results (from the past 240 hour(s))  Blood culture (routine x 2)     Status: None (Preliminary result)   Collection Time: 11/27/15  5:50 AM  Result Value Ref Range Status   Specimen Description BLOOD LEFT ANTECUBITAL  Final   Special Requests BOTTLES DRAWN AEROBIC AND ANAEROBIC 5ML  Final   Culture   Final    NO GROWTH 2 DAYS Performed at [facility]    Report Status PENDING  Incomplete  Urine culture     Status: None   Collection Time: 11/27/15  5:57 AM  Result Value Ref Range Status   Specimen Description URINE, CATHETERIZED  Final   Special Requests NONE  Final   Culture   Final    NO GROWTH 1 DAY Performed at [facility]    Report Status 11/28/2015 FINAL  Final  Blood culture (routine x 2)     Status: None (Preliminary result)   Collection Time: 11/27/15  9:00 AM  Result Value Ref Range Status   Specimen Description BLOOD RIGHT PORTA  CATH  Final   Special Requests BOTTLES DRAWN AEROBIC AND ANAEROBIC 5ML  Final   Culture   Final    NO GROWTH 2 DAYS Performed at [facility]    Report Status PENDING  Incomplete  MRSA PCR Screening     Status: None   Collection Time: 11/27/15  9:25 AM  Result Value Ref Range Status   MRSA by PCR NEGATIVE NEGATIVE Final    Comment:        The GeneXpert MRSA Assay (FDA approved for NASAL specimens only), is one component of a comprehensive MRSA colonization surveillance program. It is not intended to diagnose MRSA infection nor to guide or monitor treatment for MRSA infections.   Respiratory virus panel     Status: None   Collection Time: 11/27/15 11:21 AM  Result Value Ref Range Status   Source - RVPAN NOSE  Corrected   Respiratory Syncytial Virus A Negative Negative Final   Respiratory Syncytial Virus B Negative Negative Final   Influenza A Negative Negative Final  Influenza B Negative Negative Final   Parainfluenza 1 Negative Negative Final   Parainfluenza 2 Negative Negative Final   Parainfluenza 3 Negative Negative Final   Metapneumovirus Negative Negative Final   Rhinovirus Negative Negative Final   Adenovirus Negative Negative Final    Comment: (NOTE) Performed At: [facility] [address] [place], [place] [id number] Lindon Romp MD WH:[id number]      Studies: Dg Chest Port 1 View  11/28/2015  CLINICAL DATA:  Pneumonia.  Hypoxia. EXAM: PORTABLE CHEST 1 VIEW COMPARISON:  11/27/2015. FINDINGS: Interim placement of right IJ sheath. Power port catheter stable position. Heart size stable. Left mid lung and bibasilar pulmonary infiltrates are present. Low lung volumes with bibasilar atelectasis. Mild left pleural effusion. No pneumothorax. IMPRESSION: 1. Interim placement of right IJ sheath. PowerPort catheter in stable position. 2. Left mid lung field and bibasilar pulmonary infiltrates most consistent with pneumonia. Pulmonary edema cannot be  excluded. Small left pleural effusion. 3. Lung volumes with bibasilar atelectasis. Electronically Signed   By: Marcello Moores  Register   On: 11/28/2015 07:07    Scheduled Meds: . albuterol  2.5 mg Nebulization TID  . antiseptic oral rinse  7 mL Mouth Rinse BID  . magic mouthwash w/lidocaine  5 mL Oral TID  . metoprolol tartrate  12.5 mg Oral BID  . pantoprazole  40 mg Oral Daily  . piperacillin-tazobactam (ZOSYN)  IV  3.375 g Intravenous Q8H  . torsemide  20 mg Oral Daily   Continuous Infusions: . lactated ringers 10 mL/hr at 11/29/15 0517       Time spent: 35 minutes    [facility] [address]  [facility] Pager [phone] If 7PM-7AM, please contact night-coverage at [email or web address], password [facility] 11/29/2015, 2:10 PM  LOS: 2 days

## 2015-11-29 NOTE — Progress Notes (Signed)
PHARMACY - VANCOMYCIN and ZOSYN  Patient on IV Vancomycin (currently on hold for elevated troughs) and Zosyn 3.375gm IV q8h for treatment of pneumonia (Day #3).  Scr has fluctuated over the past 2 days:  12/5 @ 05:50 = 0.75  12/5 @ 16:54 = 1.32  12/6 @ 07:40 = 1.10  12/7 @ 06:00 = 1.27  12/5 >> vanc >> 12/5 >> Zosyn >>  12/2 Neulasta '6mg'$   12/5 blood: ngtd 12/5 urine: NGF 12/5 RSV panel: neg 12/5 MRSA PCR: neg 12/5 urine strep: neg  Dose changes/levels: 12/6 Vt at 1900 = 23 on 1g q12 - hold, check AM 12/7 Random Vanc @ 0500 = 31 - hold, check AM 12/8 Random Vanc @ 0500 = __  Plan:    Continue to Hold Vancomycin  Check random vancomycin level in the AM  Resume vancomycin once level < 20 mcg/ml  Continue Zosyn 3.375gm IV q8h (4hr extended infusions)  Peggyann Juba, PharmD, BCPS Pager: [phone] 11/29/2015 8:04 AM

## 2015-11-29 NOTE — Progress Notes (Signed)
RT called for PRN breathing treatment per patient request.  Lind Guest, RN

## 2015-11-30 ENCOUNTER — Ambulatory Visit: Payer: Medicare Other | Admitting: Cardiology

## 2015-11-30 LAB — CBC WITH DIFFERENTIAL/PLATELET
BASOS PCT: 0 %
Basophils Absolute: 0 10*3/uL (ref 0.0–0.1)
EOS PCT: 0 %
Eosinophils Absolute: 0 10*3/uL (ref 0.0–0.7)
HEMATOCRIT: 32.5 % — AB (ref 39.0–52.0)
HEMOGLOBIN: 10.3 g/dL — AB (ref 13.0–17.0)
Lymphocytes Relative: 9 %
Lymphs Abs: 0.5 10*3/uL — ABNORMAL LOW (ref 0.7–4.0)
MCH: 28.1 pg (ref 26.0–34.0)
MCHC: 31.7 g/dL (ref 30.0–36.0)
MCV: 88.8 fL (ref 78.0–100.0)
MONO ABS: 0.1 10*3/uL (ref 0.1–1.0)
Monocytes Relative: 2 %
NEUTROS PCT: 89 %
Neutro Abs: 4.4 10*3/uL (ref 1.7–7.7)
PLATELETS: 38 10*3/uL — AB (ref 150–400)
RBC: 3.66 MIL/uL — ABNORMAL LOW (ref 4.22–5.81)
RDW: 16.7 % — ABNORMAL HIGH (ref 11.5–15.5)
WBC: 5 10*3/uL (ref 4.0–10.5)

## 2015-11-30 LAB — BASIC METABOLIC PANEL
Anion gap: 5 (ref 5–15)
BUN: 40 mg/dL — ABNORMAL HIGH (ref 6–20)
CHLORIDE: 108 mmol/L (ref 101–111)
CO2: 25 mmol/L (ref 22–32)
CREATININE: 1.24 mg/dL (ref 0.61–1.24)
Calcium: 7.7 mg/dL — ABNORMAL LOW (ref 8.9–10.3)
GFR calc non Af Amer: 55 mL/min — ABNORMAL LOW (ref >60)
Glucose, Bld: 140 mg/dL — ABNORMAL HIGH (ref 65–99)
Potassium: 3.7 mmol/L (ref 3.5–5.1)
Sodium: 138 mmol/L (ref 135–145)

## 2015-11-30 LAB — VANCOMYCIN, RANDOM: Vancomycin Rm: 12 ug/mL

## 2015-11-30 MED ORDER — POTASSIUM CHLORIDE CRYS ER 20 MEQ PO TBCR
40.0000 meq | EXTENDED_RELEASE_TABLET | Freq: Once | ORAL | Status: AC
  Administered 2015-11-30: 40 meq via ORAL
  Filled 2015-11-30: qty 2

## 2015-11-30 MED ORDER — GUAIFENESIN ER 600 MG PO TB12
1200.0000 mg | ORAL_TABLET | Freq: Two times a day (BID) | ORAL | Status: DC
  Administered 2015-11-30 – 2015-12-04 (×9): 1200 mg via ORAL
  Filled 2015-11-30 (×9): qty 2

## 2015-11-30 MED ORDER — MAGNESIUM HYDROXIDE 400 MG/5ML PO SUSP
30.0000 mL | Freq: Once | ORAL | Status: AC
  Administered 2015-11-30: 30 mL via ORAL
  Filled 2015-11-30: qty 30

## 2015-11-30 MED ORDER — POLYETHYLENE GLYCOL 3350 17 G PO PACK
17.0000 g | PACK | Freq: Every day | ORAL | Status: DC
  Administered 2015-11-30 – 2015-12-03 (×2): 17 g via ORAL
  Filled 2015-11-30 (×5): qty 1

## 2015-11-30 MED ORDER — VANCOMYCIN HCL IN DEXTROSE 750-5 MG/150ML-% IV SOLN
750.0000 mg | Freq: Two times a day (BID) | INTRAVENOUS | Status: DC
  Administered 2015-11-30 – 2015-12-01 (×3): 750 mg via INTRAVENOUS
  Filled 2015-11-30 (×5): qty 150

## 2015-11-30 MED ORDER — GUAIFENESIN-DM 100-10 MG/5ML PO SYRP
5.0000 mL | ORAL_SOLUTION | ORAL | Status: DC | PRN
  Administered 2015-12-01: 5 mL via ORAL
  Filled 2015-11-30: qty 10

## 2015-11-30 NOTE — Progress Notes (Signed)
Pt refused his 14:00 neb tx. No distress noted at this time,

## 2015-11-30 NOTE — Progress Notes (Signed)
ANTIBIOTIC CONSULT NOTE - INITIAL  Pharmacy Consult for Vancomycin Indication: PNA  Allergies  Allergen Reactions  . Candesartan Cilexetil     REACTION: Muscle spasms  . Diltiazem Hcl     REACTION: Dizziness  . Doxazosin Mesylate Other (See Comments)    REACTION: H/A's  . Nifedipine     REACTION: Leg swelling  . Pravastatin Other (See Comments)    Muscle aches  . Red Yeast Rice     Muscle aches  . Rosuvastatin Other (See Comments)    REACTION: questionable: severe constipation  . Sertraline Other (See Comments)    oversedation  . Simvastatin Other (See Comments)    REACTION: Muscle aches  . Hydrocodone Other (See Comments)    constipation    Patient Measurements: Height: '5\' 9"'$  (175.3 cm) Weight: 190 lb 7.6 oz (86.4 kg) IBW/kg (Calculated) : 70.7 Adjusted Body Weight:   Vital Signs: Temp: 97.5 F (36.4 C) (12/08 0558) Temp Source: Oral (12/08 0558) BP: 111/67 mmHg (12/08 0558) Pulse Rate: 89 (12/08 0558) Intake/Output from previous day: 12/07 0701 - 12/08 0700 In: 580 [P.O.:480; IV Piggyback:100] Out: 700 [Urine:700] Intake/Output from this shift: Total I/O In: 170 [P.O.:120; IV Piggyback:50] Out: 375 [Urine:375]  Labs:  Recent Labs  11/28/15 0740 11/29/15 0600 11/30/15 0350  WBC 0.1* 1.4* 5.0  HGB 10.9* 11.3* 10.3*  PLT 27* 37* 38*  CREATININE 1.10 1.27* 1.24   Estimated Creatinine Clearance: 56.1 mL/min (by C-G formula based on Cr of 1.24).  Recent Labs  11/28/15 1935 11/29/15 0600 11/30/15 0350  VANCOTROUGH 23*  --   --   VANCORANDOM  --  31 12     Microbiology: Recent Results (from the past 720 hour(s))  Blood culture (routine x 2)     Status: None (Preliminary result)   Collection Time: 11/27/15  5:50 AM  Result Value Ref Range Status   Specimen Description BLOOD LEFT ANTECUBITAL  Final   Special Requests BOTTLES DRAWN AEROBIC AND ANAEROBIC 5ML  Final   Culture   Final    NO GROWTH 2 DAYS Performed at [facility]     Report Status PENDING  Incomplete  Urine culture     Status: None   Collection Time: 11/27/15  5:57 AM  Result Value Ref Range Status   Specimen Description URINE, CATHETERIZED  Final   Special Requests NONE  Final   Culture   Final    NO GROWTH 1 DAY Performed at [facility]    Report Status 11/28/2015 FINAL  Final  Blood culture (routine x 2)     Status: None (Preliminary result)   Collection Time: 11/27/15  9:00 AM  Result Value Ref Range Status   Specimen Description BLOOD RIGHT PORTA CATH  Final   Special Requests BOTTLES DRAWN AEROBIC AND ANAEROBIC 5ML  Final   Culture   Final    NO GROWTH 2 DAYS Performed at [facility]    Report Status PENDING  Incomplete  MRSA PCR Screening     Status: None   Collection Time: 11/27/15  9:25 AM  Result Value Ref Range Status   MRSA by PCR NEGATIVE NEGATIVE Final    Comment:        The GeneXpert MRSA Assay (FDA approved for NASAL specimens only), is one component of a comprehensive MRSA colonization surveillance program. It is not intended to diagnose MRSA infection nor to guide or monitor treatment for MRSA infections.   Respiratory virus panel     Status:  None   Collection Time: 11/27/15 11:21 AM  Result Value Ref Range Status   Source - RVPAN NOSE  Corrected   Respiratory Syncytial Virus A Negative Negative Final   Respiratory Syncytial Virus B Negative Negative Final   Influenza A Negative Negative Final   Influenza B Negative Negative Final   Parainfluenza 1 Negative Negative Final   Parainfluenza 2 Negative Negative Final   Parainfluenza 3 Negative Negative Final   Metapneumovirus Negative Negative Final   Rhinovirus Negative Negative Final   Adenovirus Negative Negative Final    Comment: (NOTE) Performed At: [facility] [address] [place], [place] [id number] Lindon Romp MD NU:[id number]     Medical History: Past Medical History  Diagnosis Date  . History of diabetes mellitus,  type II     resolved with diet, h/o neuropathy  . Diverticulosis of colon   . Hyperlipidemia   . Hypertension   . Fatty liver 02/29/00    abd ultrasound  . Lower back pain   . Systolic murmur 3474    2Decho - normal LV fxn, EF 55%, mild AS, biatrial enlargement  . History of tobacco use quit 1990s  . 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 Personal history of colonic adenomas 07/06/2013  . Hypertensive retinopathy of both eyes 2015    mild  . Dysrhythmia     Atrial fib (found 07/2014)  . Shortness of breath   . Pneumonia   . GERD (gastroesophageal reflux disease)     occasional  . Arthritis   . Neuropathy ([place])   . Constipation   . 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 Diabetes mellitus without complication (HCC)     Type 2  . Depression   . 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 Full code status 09/26/2015  . Atrial fibrillation ([place])     Medications:  Anti-infectives    Start     Dose/Rate Route Frequency Ordered Stop   11/30/15 0800  vancomycin (VANCOCIN) IVPB 750 mg/150 ml premix     750 mg 150 mL/hr over 60 Minutes Intravenous Every 12 hours 11/30/15 0637     11/27/15 2000  vancomycin (VANCOCIN) IVPB 1000 mg/200 mL premix  Status:  Discontinued     1,000 mg 200 mL/hr over 60 Minutes Intravenous Every 12 hours 11/27/15 1006 11/28/15 2056   11/27/15 1600  piperacillin-tazobactam (ZOSYN) IVPB 3.375 g     3.375 g 12.5 mL/hr over 240 Minutes Intravenous Every 8 hours 11/27/15 1006     11/27/15 0630  piperacillin-tazobactam (ZOSYN) IVPB 3.375 g     3.375 g 12.5 mL/hr over 240 Minutes Intravenous  Once 11/27/15 0621 11/27/15 1111   11/27/15 0630  vancomycin (VANCOCIN) IVPB 1000 mg/200 mL premix     1,000 mg 200 mL/hr over 60 Minutes Intravenous  Once 11/27/15 2595 11/27/15 6387      Assessment: Patient with vancomycin level below goal.  Will restart dosing.  Renal function improved but not to baseline yet.  Goal of Therapy:  Vancomycin trough level 15-20 mcg/ml  Plan:  Measure antibiotic drug levels at steady state Follow up culture results Vancomycin '750mg'$  iv q12hr  Tyler Deis, Shea Stakes [place] 11/30/2015,6:37 AM

## 2015-11-30 NOTE — Evaluation (Signed)
Physical Therapy Evaluation Patient Details Name: Shawn Espinoza MRN: [id number] DOB: 01-Jun-1941 Today's Date: 11/30/2015   History of Present Illness  74 yo male admitted with septic shock, history significant for stage IV adenocarcinoma of the lung, diabetes mellitus, type II; Diverticulosis of colon; Hyperlipidemia; Hypertension; Fatty liver; Lower back pain; and Systolic murmur.  Clinical Impression  Pt admitted with above diagnosis. Pt currently with functional limitations due to the deficits listed below (see PT Problem List). Pt will benefit from skilled PT to increase their independence and safety with mobility to allow discharge to the venue listed below. Pt did well transferring and ambulating with physical therapy today, however, limited due to weakness and fatigue. Recommend HHPT for continuous rehab; no equipment needs at this time, pt states he has a walker and a cane at home. Pt is on 2L oxygen at home delivered via nasal cannula.         Follow Up Recommendations Home health PT;Supervision/Assistance - 24 hour    Equipment Recommendations       Recommendations for Other Services       Precautions / Restrictions Precautions Precautions: Fall Precaution Comments: on home oxygen 2L [place] Restrictions Weight Bearing Restrictions: No      Mobility  Bed Mobility Overal bed mobility: Needs Assistance Bed Mobility: Supine to Sit;Sit to Supine     Supine to sit: HOB elevated;Min guard Sit to supine: Min assist   General bed mobility comments: increased time, multiple multi-modal cues for use of UE's for correct technique, min assist with both LEs sit to supine   Transfers Overall transfer level: Needs assistance Equipment used: Rolling walker (2 wheeled) Transfers: Sit to/from [facility] Sit to Stand: Min guard Stand pivot transfers: Min assist       General transfer comment: cues for using hands to push off from the bed, min assist with stand  pivot transfer for steadying  Ambulation/Gait Ambulation/Gait assistance: Min guard Ambulation Distance (Feet): 25 Feet Assistive device: Rolling walker (2 wheeled) Gait Pattern/deviations: Step-through pattern;Decreased stride length Gait velocity: decreased   General Gait Details: min cues for RW progression, attemted ambulation without supplemental oxygen, however, pt's oxygen level dropped to 87% during transfer and [place] 2 L was applied; provided education to pt and his wife on the importance of having his oxygen on during activity    Stairs            Wheelchair Mobility    Modified Rankin (Stroke Patients Only)       Balance Overall balance assessment: Needs assistance   Sitting balance-Leahy Scale: Fair     Standing balance support: Bilateral upper extremity supported;During functional activity Standing balance-Leahy Scale: Poor                               Pertinent Vitals/Pain Pain Assessment: No/denies pain (pt states he is "just not feeling good")    Home Living Family/patient expects to be discharged to:: Private residence Living Arrangements: Spouse/significant other;Children (daughter ) Available Help at Discharge: Family Type of Home: House Home Access: Level entry     Home Layout: One level Home Equipment: Environmental consultant - 2 wheels;Cane - single point      Prior Function Level of Independence: Independent with assistive device(s)         Comments: uses cane for ambulating outside, does not use anAD at home; history obtained from his wife     Hand Dominance  Extremity/Trunk Assessment   Upper Extremity Assessment: Overall WFL for tasks assessed           Lower Extremity Assessment: Overall WFL for tasks assessed      Cervical / Trunk Assessment: Normal  Communication   Communication: No difficulties  Cognition Arousal/Alertness: Awake/alert Behavior During Therapy: WFL for tasks assessed/performed Overall  Cognitive Status: Within Functional Limits for tasks assessed                      General Comments      Exercises General Exercises - Lower Extremity Ankle Circles/Pumps: AROM;Both;10 reps      Assessment/Plan    PT Assessment Patient needs continued PT services  PT Diagnosis Difficulty walking;Generalized weakness   PT Problem List Decreased strength;Decreased activity tolerance;Decreased balance;Decreased mobility;Decreased knowledge of precautions;Decreased safety awareness;Decreased knowledge of use of DME  PT Treatment Interventions DME instruction;Gait training;Functional mobility training;Therapeutic activities;Therapeutic exercise;Patient/family education   PT Goals (Current goals can be found in the Care Plan section) Acute Rehab PT Goals Patient Stated Goal: to feel better PT Goal Formulation: With patient/family Time For Goal Achievement: 12/14/15 Potential to Achieve Goals: Good    Frequency Min 3X/week   Barriers to discharge        Co-evaluation               End of Session Equipment Utilized During Treatment: Gait belt;Oxygen Activity Tolerance: Patient limited by fatigue Patient left: in bed;with call bell/phone within reach;with bed alarm set;with family/visitor present (initially left pt in recliner, but he wasn't comfortable and requested to be transferred to bed)           Time: 8099-8338 PT Time Calculation (min) (ACUTE ONLY): 23 min   Charges:   PT Evaluation $Initial PT Evaluation Tier I: 1 Procedure     PT G Codes:        Shawn Espinoza, SPT December 11, 2015, 1:26 PM

## 2015-11-30 NOTE — Evaluation (Signed)
Occupational Therapy Evaluation Patient Details Name: Shawn Espinoza MRN: [id number] DOB: Oct 25, 1941 Today's Date: 11/30/2015    History of Present Illness 74 yo male admitted with septic shock, history significant for stage IV adenocarcinoma of the lung, diabetes mellitus, type II; Diverticulosis of colon; Hyperlipidemia; Hypertension; Fatty liver; Lower back pain; and Systolic murmur.   Clinical Impression   Pt was admitted for the above.  Pt states that since being on chemo, he has been using a cane and sometimes needs assistance with adls. He feels generally weaker at this time. Will follow in acute setting with supervision level goals.      Follow Up Recommendations  Supervision/Assistance - 24 hour    Equipment Recommendations  None recommended by OT    Recommendations for Other Services       Precautions / Restrictions Precautions Precautions: Fall Precaution Comments: on home oxygen 2L [place] Restrictions Weight Bearing Restrictions: No      Mobility Bed Mobility Overal bed mobility: Needs Assistance Bed Mobility: Supine to Sit;Sit to Supine     Supine to sit: Min assist Sit to supine: Min guard   General bed mobility comments: light min A to sit up; hob raised; guarded lines when pt laid down  Transfers Overall transfer level: Needs assistance Equipment used: Rolling walker (2 wheeled) Transfers: Sit to/from Stand Sit to Stand: Min guard        General transfer comment: used bedrail for support    Balance Overall balance assessment: Needs assistance   Sitting balance-Leahy Scale: Fair                                    ADL Overall ADL's : Needs assistance/impaired     Grooming: Set up;Sitting   Upper Body Bathing: Set up;Sitting   Lower Body Bathing: Minimal assistance;Sit to/from stand   Upper Body Dressing : Minimal assistance;Sitting (lines)   Lower Body Dressing: Moderate assistance;Sit to/from stand                 General ADL Comments: pt seen for evaluation:  did not need to use commode and did not wish to sit up in chair.  Wife assists as needed with ADLs.  Pt has been going through chemo.  Pt feels generally weaker.  Educated on energy conservation and short bursts of activity.  He has worked with theraband in the past; will drop this off (level 1) later today     Vision     Perception     Praxis      Pertinent Vitals/Pain Pain Assessment: No/denies pain     Hand Dominance     Extremity/Trunk Assessment Upper Extremity Assessment Upper Extremity Assessment: Generalized weakness (grossly 3+/5)   Lower Extremity Assessment Lower Extremity Assessment: Overall WFL for tasks assessed   Cervical / Trunk Assessment Cervical / Trunk Assessment: Normal   Communication Communication Communication: No difficulties   Cognition Arousal/Alertness: Awake/alert Behavior During Therapy: WFL for tasks assessed/performed Overall Cognitive Status: Within Functional Limits for tasks assessed                     General Comments       Exercises       Shoulder Instructions      Home Living Family/patient expects to be discharged to:: Private residence Living Arrangements: Spouse/significant other;Children Available Help at Discharge: Family (daughter) Type of Home: House Home Access: Level entry  Home Layout: One level     Bathroom Shower/Tub: Occupational psychologist: Standard     Home Equipment: Shower seat;Bedside commode          Prior Functioning/Environment Level of Independence: Independent with assistive device(s)        Comments: uses cane for ambulating outside, does not use anAD at home; history obtained from his wife    OT Diagnosis: Generalized weakness   OT Problem List: Decreased strength;Decreased activity tolerance;Cardiopulmonary status limiting activity;Impaired balance (sitting and/or standing)   OT Treatment/Interventions:  Self-care/ADL training;DME and/or AE instruction;Patient/family education;Therapeutic activities;Balance training    OT Goals(Current goals can be found in the care plan section) Acute Rehab OT Goals Patient Stated Goal: to feel better OT Goal Formulation: With patient Time For Goal Achievement: 12/14/15 Potential to Achieve Goals: Good ADL Goals Pt Will Perform Grooming: with supervision;standing Pt Will Transfer to Toilet: with supervision;ambulating;bedside commode Additional ADL Goal #1: pt will perform bil shoulder strengthening with level one theraband at supervision level with handout  OT Frequency: Min 2X/week   Barriers to D/C:            Co-evaluation              End of Session Nurse Communication:  (unable to reset bed alarm: (NT in room))  Activity Tolerance: Patient limited by fatigue Patient left: in bed;with call bell/phone within reach;with nursing/sitter in room   Time: 3295-1884 OT Time Calculation (min): 12 min Charges:  OT General Charges $OT Visit: 1 Procedure OT Evaluation $Initial OT Evaluation Tier I: 1 Procedure G-Codes:    Qiara Minetti 12-10-2015, 2:05 PM  Lesle Chris, OTR/L [phone] December 10, 2015

## 2015-11-30 NOTE — Care Management Important Message (Signed)
Important Message  Patient Details  Name: Shawn Espinoza MRN: [id number] Date of Birth: 1941/01/04   Medicare Important Message Given:  Yes    Camillo Flaming 11/30/2015, 11:56 AMImportant Message  Patient Details  Name: Shawn Espinoza MRN: [id number] Date of Birth: [birth year]   Medicare Important Message Given:  Yes    Camillo Flaming 11/30/2015, 11:56 AM

## 2015-11-30 NOTE — Progress Notes (Signed)
OT Note:  Pt asleep.  Dropped off written UE HEP for shoulder flexion and horizontal abduction with level one theraband.  Explained to wife; will go over this with pt on next visit.  Rayne, OTR/L [phone] 11/30/2015

## 2015-11-30 NOTE — Progress Notes (Signed)
[facility] PROGRESS NOTE   Shawn Espinoza UDJ:[id number] DOB: 1941/04/30 DOA: 11/27/2015 PCP: [name], MD  HPI/Subjective: Feels better, complains about constipation, given MOM and MiraLAX.. SOB, cough and sputum production much better than yesterday.  Assessment/Plan: Principal Problem:   Septic shock (HCC) Active Problems:   Positive hepatitis C antibody test   Adenocarcinoma of left lung, stage 4 (HCC)   Thrush   Antineoplastic chemotherapy induced pancytopenia ([place])   Long term current use of anticoagulant therapy   HCAP (healthcare-associated pneumonia)    Severe sepsis Admitted initially to the ICU with low blood pressure of 88/47, has fever and HCAP. Did not require this pressor support, the hypotension improved after IV fluid hydration. This is treated with IV antibiotics and aggressive hydration with IV fluids. Sepsis physiology resolved.  HCAP Healthcare associated pneumonia with associated neutropenia/lymphopenia. Chest x-ray showed left-sided and bibasilar infiltrates consistent with pneumonia. Patient started on vancomycin and Zosyn on admission to the hospital. Continue supportive management with bronchodilators, mucolytics, antitussives and oxygen as needed.  Pancytopenia Secondary to chemotherapy, patient receiving chemotherapy for stage IV lung cancer. Presented with WBC of 0.2, hemoglobin of 6.1 and platelets of 18. Overall improving. Patient received Neulasta after the chemotherapy.  Leukopenia With neutropenia and lymphopenia, secondary to recent chemotherapy. Patient received Neulasta after the chemotherapy, ANC was 0 on admission currently 4.4.  Atrial fibrillation Patient has paroxysmal atrial fibrillation, had brief RVR on admission. Patient was on amiodarone drip, likely this was secondary to hypotension and sepsis. Patient is on Eliquis, currently on hold because of thrombocytopenia, will restart anticoagulation when  platelets >50. This patients CHA2DS2-VASc Score and unadjusted Ischemic Stroke Rate (% per year) is equal to 3.2 % stroke rate/year from a score of 3 for diabetes and 2 points for age >70. Above score calculated as 1 point each if present [CHF, HTN, DM, Vascular=MI/PAD/Aortic Plaque, Age if 65-74, or Male] Above score calculated as 2 points each if present [Age > 75, or Stroke/TIA/TE]  Stage IV adenocarcinoma of the lung Patient follows with Dr. [name], currently receiving palliative chemotherapy.   Code Status: Full Code Family Communication: Plan discussed with the patient. Disposition Plan: Remains inpatient Diet: Diet Carb Modified Fluid consistency:: Thin; Room service appropriate?: Yes  Consultants:  None  Procedures:  None  Antibiotics:  None   Objective: Filed Vitals:   11/29/15 2119 11/30/15 0558  BP: 109/54 111/67  Pulse: 105 89  Temp: 97.7 F (36.5 C) 97.5 F (36.4 C)  Resp: 24 26    Intake/Output Summary (Last 24 hours) at 11/30/15 1051 Last data filed at 11/30/15 0602  Gross per 24 hour  Intake    340 ml  Output    700 ml  Net   -360 ml   Filed Weights   11/28/15 0440 11/29/15 0512 11/30/15 0558  Weight: 84.2 kg (185 lb 10 oz) 84.2 kg (185 lb 10 oz) 86.4 kg (190 lb 7.6 oz)    Exam: General: Alert and awake, oriented x3, not in any acute distress. HEENT: anicteric sclera, pupils reactive to light and accommodation, EOMI CVS: S1-S2 clear, no murmur rubs or gallops Chest: clear to auscultation bilaterally, no wheezing, rales or rhonchi Abdomen: soft nontender, nondistended, normal bowel sounds, no organomegaly Extremities: no cyanosis, clubbing or edema noted bilaterally Neuro: Cranial nerves II-XII intact, no focal neurological deficits  Data Reviewed: Basic Metabolic Panel:  Recent Labs Lab 11/27/15 0550 11/27/15 1654 11/28/15 0740 11/29/15 0600 11/30/15 0350  NA 142 135 137 137 138  K 2.1* 5.1 5.2* 4.3 3.7  CL 122* 105 106 106 108   CO2 13* '23 24 24 25  '$ GLUCOSE 93 219* 195* 181* 140*  BUN 28* 42* 41* 44* 40*  CREATININE 0.75 1.32* 1.10 1.27* 1.24  CALCIUM 4.4* 7.3* 7.6* 7.9* 7.7*  MG 0.8*  --  1.5* 2.2  --   PHOS  --   --  2.5  --   --    Liver Function Tests:  Recent Labs Lab 11/27/15 0550  AST 14*  ALT 15*  ALKPHOS 24*  BILITOT 0.9  PROT <3.0*  ALBUMIN 1.3*   No results for input(s): LIPASE, AMYLASE in the last 168 hours. No results for input(s): AMMONIA in the last 168 hours. CBC:  Recent Labs Lab 11/27/15 0550 11/28/15 0740 11/29/15 0600 11/30/15 0350  WBC 0.2* 0.1* 1.4* 5.0  NEUTROABS 0.0*  --  0.9* 4.4  HGB 6.1* 10.9* 11.3* 10.3*  HCT 19.1* 33.6* 34.3* 32.5*  MCV 92.7 90.3 88.2 88.8  PLT 18* 27* 37* 38*   Cardiac Enzymes:  Recent Labs Lab 11/27/15 0550 11/27/15 1654 11/27/15 2244  TROPONINI <0.03 <0.03 <0.03   BNP (last 3 results)  Recent Labs  11/27/15 0550  BNP 128.7*    ProBNP (last 3 results)  Recent Labs  08/18/15 1245  PROBNP 222.0*    CBG:  Recent Labs Lab 11/28/15 0024 11/28/15 0612 11/28/15 0744 11/28/15 1228 11/28/15 1704  GLUCAP 203* 158* 160* 190* 186*    Micro Recent Results (from the past 240 hour(s))  Blood culture (routine x 2)     Status: None (Preliminary result)   Collection Time: 11/27/15  5:50 AM  Result Value Ref Range Status   Specimen Description BLOOD LEFT ANTECUBITAL  Final   Special Requests BOTTLES DRAWN AEROBIC AND ANAEROBIC 5ML  Final   Culture   Final    NO GROWTH 2 DAYS Performed at [facility] [place]    Report Status PENDING  Incomplete  Urine culture     Status: None   Collection Time: 11/27/15  5:57 AM  Result Value Ref Range Status   Specimen Description URINE, CATHETERIZED  Final   Special Requests NONE  Final   Culture   Final    NO GROWTH 1 DAY Performed at [facility]    Report Status 11/28/2015 FINAL  Final  Blood culture (routine x 2)     Status: None (Preliminary result)   Collection Time:  11/27/15  9:00 AM  Result Value Ref Range Status   Specimen Description BLOOD RIGHT PORTA CATH  Final   Special Requests BOTTLES DRAWN AEROBIC AND ANAEROBIC 5ML  Final   Culture   Final    NO GROWTH 2 DAYS Performed at [facility]    Report Status PENDING  Incomplete  MRSA PCR Screening     Status: None   Collection Time: 11/27/15  9:25 AM  Result Value Ref Range Status   MRSA by PCR NEGATIVE NEGATIVE Final    Comment:        The GeneXpert MRSA Assay (FDA approved for NASAL specimens only), is one component of a comprehensive MRSA colonization surveillance program. It is not intended to diagnose MRSA infection nor to guide or monitor treatment for MRSA infections.   Respiratory virus panel     Status: None   Collection Time: 11/27/15 11:21 AM  Result Value Ref Range Status   Source - RVPAN NOSE  Corrected   Respiratory Syncytial Virus A Negative  Negative Final   Respiratory Syncytial Virus B Negative Negative Final   Influenza A Negative Negative Final   Influenza B Negative Negative Final   Parainfluenza 1 Negative Negative Final   Parainfluenza 2 Negative Negative Final   Parainfluenza 3 Negative Negative Final   Metapneumovirus Negative Negative Final   Rhinovirus Negative Negative Final   Adenovirus Negative Negative Final    Comment: (NOTE) Performed At: [facility] [place], [place] [id number] Lindon Romp MD [id number]      Studies: No results found.  Scheduled Meds: . albuterol  2.5 mg Nebulization TID  . antiseptic oral rinse  7 mL Mouth Rinse BID  . magic mouthwash w/lidocaine  5 mL Oral TID  . metoprolol tartrate  12.5 mg Oral BID  . pantoprazole  40 mg Oral Daily  . piperacillin-tazobactam (ZOSYN)  IV  3.375 g Intravenous Q8H  . polyethylene glycol  17 g Oral Daily  . torsemide  20 mg Oral Daily  . vancomycin  750 mg Intravenous Q12H   Continuous Infusions:       Time spent: 35  minutes    [facility] A  [facility] Pager [phone] If 7PM-7AM, please contact night-coverage at [email or web address], password [facility] 11/30/2015, 10:51 AM  LOS: 3 days

## 2015-12-01 LAB — CBC WITH DIFFERENTIAL/PLATELET
BASOS ABS: 0 10*3/uL (ref 0.0–0.1)
Basophils Relative: 0 %
EOS ABS: 0 10*3/uL (ref 0.0–0.7)
Eosinophils Relative: 0 %
HCT: 32.5 % — ABNORMAL LOW (ref 39.0–52.0)
HEMOGLOBIN: 10.3 g/dL — AB (ref 13.0–17.0)
LYMPHS PCT: 5 %
Lymphs Abs: 0.6 10*3/uL — ABNORMAL LOW (ref 0.7–4.0)
MCH: 28.9 pg (ref 26.0–34.0)
MCHC: 31.7 g/dL (ref 30.0–36.0)
MCV: 91.3 fL (ref 78.0–100.0)
Monocytes Absolute: 0.6 10*3/uL (ref 0.1–1.0)
Monocytes Relative: 5 %
NEUTROS PCT: 90 %
Neutro Abs: 10.3 10*3/uL — ABNORMAL HIGH (ref 1.7–7.7)
Platelets: 26 10*3/uL — CL (ref 150–400)
RBC: 3.56 MIL/uL — ABNORMAL LOW (ref 4.22–5.81)
RDW: 16.8 % — ABNORMAL HIGH (ref 11.5–15.5)
WBC: 11.5 10*3/uL — ABNORMAL HIGH (ref 4.0–10.5)

## 2015-12-01 LAB — BASIC METABOLIC PANEL
ANION GAP: 6 (ref 5–15)
BUN: 39 mg/dL — ABNORMAL HIGH (ref 6–20)
CALCIUM: 7.7 mg/dL — AB (ref 8.9–10.3)
CO2: 25 mmol/L (ref 22–32)
Chloride: 105 mmol/L (ref 101–111)
Creatinine, Ser: 1.37 mg/dL — ABNORMAL HIGH (ref 0.61–1.24)
GFR, EST AFRICAN AMERICAN: 57 mL/min — AB (ref >60)
GFR, EST NON AFRICAN AMERICAN: 49 mL/min — AB (ref >60)
Glucose, Bld: 116 mg/dL — ABNORMAL HIGH (ref 65–99)
Potassium: 4.3 mmol/L (ref 3.5–5.1)
Sodium: 136 mmol/L (ref 135–145)

## 2015-12-01 LAB — VANCOMYCIN, TROUGH: VANCOMYCIN TR: 21 ug/mL — AB (ref 10.0–20.0)

## 2015-12-01 MED ORDER — SODIUM CHLORIDE 0.9 % IV SOLN
INTRAVENOUS | Status: DC
  Administered 2015-12-01 – 2015-12-02 (×3): via INTRAVENOUS

## 2015-12-01 MED ORDER — VANCOMYCIN HCL IN DEXTROSE 750-5 MG/150ML-% IV SOLN
750.0000 mg | INTRAVENOUS | Status: DC
  Administered 2015-12-02 – 2015-12-04 (×3): 750 mg via INTRAVENOUS
  Filled 2015-12-01 (×4): qty 150

## 2015-12-01 NOTE — Progress Notes (Signed)
Physical Therapy Treatment Patient Details Name: Shawn Espinoza MRN: [id number] DOB: 1941-07-05 Today's Date: 28-Dec-2015    History of Present Illness 74 yo male admitted with septic shock, history significant for stage IV adenocarcinoma of the lung, diabetes mellitus, type II; Diverticulosis of colon; Hyperlipidemia; Hypertension; Fatty liver; Lower back pain; and Systolic murmur.    PT Comments    Pt assisted OOB to ambulate in hallway and then to sit up in recliner.  Encouraged pt to remain OOB for awhile and to use flutter (spouse states he has not been using this).  Spouse reports possible d/c home tomorrow.  Follow Up Recommendations  Home health PT;Supervision/Assistance - 24 hour     Equipment Recommendations  None recommended by PT    Recommendations for Other Services       Precautions / Restrictions Precautions Precautions: Fall Precaution Comments: on home oxygen 2L [place]    Mobility  Bed Mobility Overal bed mobility: Needs Assistance Bed Mobility: Supine to Sit     Supine to sit: HOB elevated;Min guard     General bed mobility comments: increased time, multiple multi-modal cues for use of UE's for correct technique  Transfers Overall transfer level: Needs assistance Equipment used: Rolling walker (2 wheeled) Transfers: Sit to/from Stand Sit to Stand: Min guard         General transfer comment: cues for using hands to push off from the bed  Ambulation/Gait Ambulation/Gait assistance: Min guard Ambulation Distance (Feet): 60 Feet Assistive device: Rolling walker (2 wheeled) Gait Pattern/deviations: Step-through pattern;Trunk flexed;Decreased stride length Gait velocity: decreased   General Gait Details: min cues for RW progression, remained on 2L O2 [place], verbal cues for taking rest breaks for breathing with SOB   Stairs            Wheelchair Mobility    Modified Rankin (Stroke Patients Only)       Balance                                     Cognition Arousal/Alertness: Awake/alert Behavior During Therapy: WFL for tasks assessed/performed Overall Cognitive Status: Within Functional Limits for tasks assessed                      Exercises      General Comments        Pertinent Vitals/Pain Pain Assessment: No/denies pain    Home Living                      Prior Function            PT Goals (current goals can now be found in the care plan section) Progress towards PT goals: Progressing toward goals    Frequency  Min 3X/week    PT Plan Current plan remains appropriate    Co-evaluation             End of Session Equipment Utilized During Treatment: Gait belt;Oxygen Activity Tolerance: Patient limited by fatigue Patient left: in chair;with call bell/phone within reach;with chair alarm set;with nursing/sitter in room;with family/visitor present     Time: 1345-1405 PT Time Calculation (min) (ACUTE ONLY): 20 min  Charges:  $Gait Training: 8-22 mins                    G Codes:      Shawn Espinoza,Shawn Espinoza 2015/12/28, 2:40 PM Shawn Espinoza, PT,  DPT 12/01/2015 Pager: [phone]

## 2015-12-01 NOTE — Progress Notes (Signed)
Pharmacy Consult Note - Vancomycin Follow Up  Labs: vanc trough 21  A/P: Vanc trough supratherapeutic (goal 15-20) on vanc '750mg'$  IV q12. Will change vanc from '750mg'$  IV q12 to '750mg'$  IV q24. Will recheck trough as necessary  Adrian Saran, PharmD, BCPS Pager [phone] 12/01/2015 8:05 PM

## 2015-12-01 NOTE — Progress Notes (Signed)
PHARMACY - VANCOMYCIN and ZOSYN  Patient on IV Vancomycin and Zosyn 3.375gm IV q8h for treatment of pneumonia (Day #5).  Scr has fluctuated over the past several days:  12/5 @ 05:50 = 0.75  12/5 @ 16:54 = 1.32  12/6 @ 07:40 = 1.10  12/7 @ 06:00 = 1.27  12/8 @ 0350 =1.24  12/9 @ 0403 =1.37  12/5 >> vanc >> 12/5 >> Zosyn >>  12/2 Neulasta '6mg'$   12/5 blood: ngtd 12/5 urine: NGF 12/5 RSV panel: neg 12/5 MRSA PCR: neg 12/5 urine strep: neg  Dose changes/levels: 12/6 Vt at 1900 = 23 on 1g q12 - hold, check AM 12/7 Random Vanc @ 0500 = 31 - hold, check AM 12/8 Random Vanc @ 0500 = 12, Vanc restarted at '750mg'$  q12h 12/9 Vt at 1900=____  Plan:    Check vanc trough tonight and adjust dose accordingly  Continue Zosyn 3.375gm IV q8h (4hr extended infusions)  Dolly Rias RPh 12/01/2015, 11:54 AM Pager [phone]

## 2015-12-01 NOTE — Progress Notes (Signed)
CRITICAL VALUE ALERT  Critical value received:  Platelets 26  Date of notification:  12/01/2015  Time of notification:  8381  Critical value read back:Yes.    Nurse who received alert:  Thedora Hinders, RN  MD notified (1st page):  L. [place]  Time of first page:  0453  MD notified (2nd page):  Time of second page:  Responding MD:  Roger Shelter  Time MD responded:  8403, no new orders given. Will continue to monitor.

## 2015-12-01 NOTE — Progress Notes (Signed)
Patient has been refusing nebulizer treatments despite RT education efforts. He asks that scheduled nebulizers be stopped. He is noted to use HFA albuterol when needed outside the hospital settings. RT protocol completed. Acuity score appropriate for TID routine schedule. However, the patient does not wish to continue this regimen. Orders changed to PRN per patient request.

## 2015-12-01 NOTE — Progress Notes (Signed)
[facility] PROGRESS NOTE   Shawn Espinoza [id number] DOB: [birth year] DOA: 11/27/2015 PCP: [name], MD  HPI/Subjective: Feels better, still has some cough with minimal sputum production. Discussed with wife, patient is still weak, not sure is alive can take care of him at home. Hold Demadex and give IV fluids for today.  Assessment/Plan: Principal Problem:   Septic shock (HCC) Active Problems:   Positive hepatitis C antibody test   Adenocarcinoma of left lung, stage 4 (HCC)   Thrush   Antineoplastic chemotherapy induced pancytopenia ([place])   Long term current use of anticoagulant therapy   HCAP (healthcare-associated pneumonia)    Severe sepsis Admitted initially to the ICU with low blood pressure of 88/47, has fever and HCAP. Did not require this pressor support, the hypotension improved after IV fluid hydration. This is treated with IV antibiotics and aggressive hydration with IV fluids. Sepsis physiology resolved.  HCAP Healthcare associated pneumonia with associated neutropenia/lymphopenia. Chest x-ray showed left-sided and bibasilar infiltrates consistent with pneumonia. Patient started on vancomycin and Zosyn on admission to the hospital. Continue supportive management with bronchodilators, mucolytics, antitussives and oxygen as needed. No changes to the respiratory regimen.  Pancytopenia Secondary to chemotherapy, patient receiving chemotherapy for stage IV lung cancer. Presented with WBC of 0.2, hemoglobin of 6.1 and platelets of 18. Overall improving. Patient received Neulasta after the chemotherapy.  Leukopenia With neutropenia and lymphopenia, secondary to recent chemotherapy. Patient received Neulasta after the chemotherapy, ANC was 0 on admission currently 4.4.  Atrial fibrillation Patient has paroxysmal atrial fibrillation, had brief RVR on admission. Patient was on amiodarone drip, likely this was secondary to hypotension and  sepsis. Patient is on Eliquis, currently on hold because of thrombocytopenia, will restart anticoagulation when platelets >50. This patients CHA2DS2-VASc Score and unadjusted Ischemic Stroke Rate (% per year) is equal to 3.2 % stroke rate/year from a score of 3 for diabetes and 2 points for age >55. Above score calculated as 1 point each if present [CHF, HTN, DM, Vascular=MI/PAD/Aortic Plaque, Age if 65-74, or Male] Above score calculated as 2 points each if present [Age > 75, or Stroke/TIA/TE]  Stage IV adenocarcinoma of the lung Patient follows with Dr. [name], currently receiving palliative chemotherapy.  Acute kidney injury Slow but progressive worsening of creatinine, hold Demadex and started IV fluids. Check BMP in a.m.  Code Status: Full Code Family Communication: Plan discussed with the patient. Disposition Plan:   Discharge on Saturday versus Sunday morning.  Diet: Diet Carb Modified Fluid consistency:: Thin; Room service appropriate?: Yes  Consultants:  None  Procedures:  None  Antibiotics:  None   Objective: Filed Vitals:   12/01/15 0520 12/01/15 1409  BP: 119/64 109/68  Pulse: 81 80  Temp: 97.9 F (36.6 C) 97.7 F (36.5 C)  Resp: 20 22    Intake/Output Summary (Last 24 hours) at 12/01/15 1625 Last data filed at 12/01/15 1442  Gross per 24 hour  Intake    810 ml  Output   1075 ml  Net   -265 ml   Filed Weights   11/29/15 0512 11/30/15 0558 12/01/15 0520  Weight: 84.2 kg (185 lb 10 oz) 86.4 kg (190 lb 7.6 oz) 86 kg (189 lb 9.5 oz)    Exam: General: Alert and awake, oriented x3, not in any acute distress. HEENT: anicteric sclera, pupils reactive to light and accommodation, EOMI CVS: S1-S2 clear, no murmur rubs or gallops Chest: clear to auscultation bilaterally, no wheezing, rales or rhonchi Abdomen: soft nontender, nondistended,  normal bowel sounds, no organomegaly Extremities: no cyanosis, clubbing or edema noted bilaterally Neuro: Cranial  nerves II-XII intact, no focal neurological deficits  Data Reviewed: Basic Metabolic Panel:  Recent Labs Lab 11/27/15 0550 11/27/15 1654 11/28/15 0740 11/29/15 0600 11/30/15 0350 12/01/15 0403  NA 142 135 137 137 138 136  K 2.1* 5.1 5.2* 4.3 3.7 4.3  CL 122* 105 106 106 108 105  CO2 13* '23 24 24 25 25  '$ GLUCOSE 93 219* 195* 181* 140* 116*  BUN 28* 42* 41* 44* 40* 39*  CREATININE 0.75 1.32* 1.10 1.27* 1.24 1.37*  CALCIUM 4.4* 7.3* 7.6* 7.9* 7.7* 7.7*  MG 0.8*  --  1.5* 2.2  --   --   PHOS  --   --  2.5  --   --   --    Liver Function Tests:  Recent Labs Lab 11/27/15 0550  AST 14*  ALT 15*  ALKPHOS 24*  BILITOT 0.9  PROT <3.0*  ALBUMIN 1.3*   No results for input(s): LIPASE, AMYLASE in the last 168 hours. No results for input(s): AMMONIA in the last 168 hours. CBC:  Recent Labs Lab 11/27/15 0550 11/28/15 0740 11/29/15 0600 11/30/15 0350 12/01/15 0403  WBC 0.2* 0.1* 1.4* 5.0 11.5*  NEUTROABS 0.0*  --  0.9* 4.4 10.3*  HGB 6.1* 10.9* 11.3* 10.3* 10.3*  HCT 19.1* 33.6* 34.3* 32.5* 32.5*  MCV 92.7 90.3 88.2 88.8 91.3  PLT 18* 27* 37* 38* 26*   Cardiac Enzymes:  Recent Labs Lab 11/27/15 0550 11/27/15 1654 11/27/15 2244  TROPONINI <0.03 <0.03 <0.03   BNP (last 3 results)  Recent Labs  11/27/15 0550  BNP 128.7*    ProBNP (last 3 results)  Recent Labs  08/18/15 1245  PROBNP 222.0*    CBG:  Recent Labs Lab 11/28/15 0024 11/28/15 0612 11/28/15 0744 11/28/15 1228 11/28/15 1704  GLUCAP 203* 158* 160* 190* 186*    Micro Recent Results (from the past 240 hour(s))  Blood culture (routine x 2)     Status: None (Preliminary result)   Collection Time: 11/27/15  5:50 AM  Result Value Ref Range Status   Specimen Description BLOOD LEFT ANTECUBITAL  Final   Special Requests BOTTLES DRAWN AEROBIC AND ANAEROBIC 5ML  Final   Culture   Final    NO GROWTH 4 DAYS Performed at [facility]    Report Status PENDING  Incomplete  Urine culture      Status: None   Collection Time: 11/27/15  5:57 AM  Result Value Ref Range Status   Specimen Description URINE, CATHETERIZED  Final   Special Requests NONE  Final   Culture   Final    NO GROWTH 1 DAY Performed at [facility]    Report Status 11/28/2015 FINAL  Final  Blood culture (routine x 2)     Status: None (Preliminary result)   Collection Time: 11/27/15  9:00 AM  Result Value Ref Range Status   Specimen Description BLOOD RIGHT PORTA CATH  Final   Special Requests BOTTLES DRAWN AEROBIC AND ANAEROBIC 5ML  Final   Culture   Final    NO GROWTH 4 DAYS Performed at [facility]    Report Status PENDING  Incomplete  MRSA PCR Screening     Status: None   Collection Time: 11/27/15  9:25 AM  Result Value Ref Range Status   MRSA by PCR NEGATIVE NEGATIVE Final    Comment:        The GeneXpert  MRSA Assay (FDA approved for NASAL specimens only), is one component of a comprehensive MRSA colonization surveillance program. It is not intended to diagnose MRSA infection nor to guide or monitor treatment for MRSA infections.   Respiratory virus panel     Status: None   Collection Time: 11/27/15 11:21 AM  Result Value Ref Range Status   Source - RVPAN NOSE  Corrected   Respiratory Syncytial Virus A Negative Negative Final   Respiratory Syncytial Virus B Negative Negative Final   Influenza A Negative Negative Final   Influenza B Negative Negative Final   Parainfluenza 1 Negative Negative Final   Parainfluenza 2 Negative Negative Final   Parainfluenza 3 Negative Negative Final   Metapneumovirus Negative Negative Final   Rhinovirus Negative Negative Final   Adenovirus Negative Negative Final    Comment: (NOTE) Performed At: [facility] [place], [place] [id number] Lindon Romp MD [id number]      Studies: No results found.  Scheduled Meds: . antiseptic oral rinse  7 mL Mouth Rinse BID  . guaiFENesin  1,200 mg Oral BID  . magic  mouthwash w/lidocaine  5 mL Oral TID  . metoprolol tartrate  12.5 mg Oral BID  . pantoprazole  40 mg Oral Daily  . piperacillin-tazobactam (ZOSYN)  IV  3.375 g Intravenous Q8H  . polyethylene glycol  17 g Oral Daily  . vancomycin  750 mg Intravenous Q12H   Continuous Infusions: . sodium chloride 100 mL/hr at 12/01/15 0932       Time spent: 35 minutes    [facility] A  [facility] Pager [phone] If 7PM-7AM, please contact night-coverage at [email or web address], password [facility] 12/01/2015, 4:25 PM  LOS: 4 days

## 2015-12-02 LAB — BASIC METABOLIC PANEL
ANION GAP: 6 (ref 5–15)
BUN: 25 mg/dL — ABNORMAL HIGH (ref 6–20)
CALCIUM: 7.5 mg/dL — AB (ref 8.9–10.3)
CO2: 26 mmol/L (ref 22–32)
Chloride: 108 mmol/L (ref 101–111)
Creatinine, Ser: 1.08 mg/dL (ref 0.61–1.24)
GLUCOSE: 131 mg/dL — AB (ref 65–99)
POTASSIUM: 4.1 mmol/L (ref 3.5–5.1)
SODIUM: 140 mmol/L (ref 135–145)

## 2015-12-02 LAB — CBC WITH DIFFERENTIAL/PLATELET
BASOS ABS: 0 10*3/uL (ref 0.0–0.1)
Basophils Relative: 0 %
EOS ABS: 0 10*3/uL (ref 0.0–0.7)
Eosinophils Relative: 0 %
HEMATOCRIT: 33.8 % — AB (ref 39.0–52.0)
HEMOGLOBIN: 10.5 g/dL — AB (ref 13.0–17.0)
LYMPHS PCT: 5 %
Lymphs Abs: 0.9 10*3/uL (ref 0.7–4.0)
MCH: 29.1 pg (ref 26.0–34.0)
MCHC: 31.1 g/dL (ref 30.0–36.0)
MCV: 93.6 fL (ref 78.0–100.0)
MONOS PCT: 4 %
Monocytes Absolute: 0.7 10*3/uL (ref 0.1–1.0)
NEUTROS ABS: 16.2 10*3/uL — AB (ref 1.7–7.7)
NEUTROS PCT: 91 %
Platelets: 31 10*3/uL — ABNORMAL LOW (ref 150–400)
RBC: 3.61 MIL/uL — AB (ref 4.22–5.81)
RDW: 16.8 % — ABNORMAL HIGH (ref 11.5–15.5)
WBC: 17.8 10*3/uL — ABNORMAL HIGH (ref 4.0–10.5)

## 2015-12-02 LAB — CULTURE, BLOOD (ROUTINE X 2)
CULTURE: NO GROWTH
Culture: NO GROWTH

## 2015-12-02 MED ORDER — TRAMADOL HCL 50 MG PO TABS
50.0000 mg | ORAL_TABLET | Freq: Once | ORAL | Status: AC
Start: 2015-12-02 — End: 2015-12-02
  Administered 2015-12-02: 50 mg via ORAL
  Filled 2015-12-02: qty 1

## 2015-12-02 NOTE — Clinical Social Work Note (Signed)
Clinical Social Work Assessment  Patient Details  Name: Shawn Espinoza MRN: [id number] Date of Birth: 1941-04-14  Date of referral:  12/02/15               Reason for consult:  Facility Placement                Permission sought to share information with:  Chartered certified accountant granted to share information::  Yes, Verbal Permission Granted  Name::        Agency::     Relationship::     Contact Information:     Housing/Transportation Living arrangements for the past 2 months:  Single Family Home Source of Information:  Spouse Patient Interpreter Needed:  None Criminal Activity/Legal Involvement Pertinent to Current Situation/Hospitalization:  No - Comment as needed Significant Relationships:  Spouse Lives with:  Spouse Do you feel safe going back to the place where you live?  No Need for family participation in patient care:  No (Coment)  Care giving concerns: Wife does not feel comfortable re: taking pt home until he gets rehab in a SNF.  She explains that he does not have "adequate care" at home and needs to be more independent prior to returning home.  Social Worker assessment / plan: CSW met with pt/wife to discuss the role of CSW/discharge planning.  Pt/wife agreeable to SNF search in [place]., although they prefer [place].  CSW will begin bed search and facilitate NH tx as appropriate. Employment status:  Retired Health visitor PT Recommendations:  Home with [place] / Referral to community resources:  [place]  Patient/Family's Response to care: Appreciative of CSW's "perfect timing" on visit and willingness to discuss the placement process with her as she "doesn't do this very often."   Patient/Family's Understanding of and Emotional Response to Diagnosis, Current Treatment, and Prognosis: Pt/wife realize that pt will need to be stronger once he returns home in order to increase his independence.   Wife realizes that she is limited in her ability to provide care for pt and is nervous about him coming home with only [place] in place. Emotional Assessment Appearance:  Appears stated age Attitude/Demeanor/Rapport:  Unresponsive Affect (typically observed):  Quiet Orientation:  Oriented to Self, Oriented to Place, Oriented to  Time, Oriented to Situation Alcohol / Substance use:  Not Applicable Psych involvement (Current and /or in the community):  No (Comment)  Discharge Needs  Concerns to be addressed:  Discharge Planning Concerns Readmission within the last 30 days:  No Current discharge risk:  None Barriers to Discharge:  No Barriers Identified   Arlind Klingerman M, LCSW 12/02/2015, 3:02 PM

## 2015-12-02 NOTE — Progress Notes (Signed)
Patient refused to eat breakfast. Poor PO intake. Will continue to monitor and encourage PO intake.

## 2015-12-02 NOTE — Progress Notes (Signed)
[facility] PROGRESS NOTE   Shawn Espinoza RCV:[id number] DOB: 04/07/1941 DOA: 11/27/2015 PCP: [name], MD  HPI/Subjective: Still complains about cough and sputum production, total WBC 17.8 likely secondary to Neulasta Seen with wife at bedside, she is very concerned about discharging him home with home health service and asked for nursing home. I agree that patient needs SNF, I do not think he is ready to go home or his wife can't handle him at home.   Assessment/Plan: Principal Problem:   Septic shock (HCC) Active Problems:   Positive hepatitis C antibody test   Adenocarcinoma of left lung, stage 4 (HCC)   Thrush   Antineoplastic chemotherapy induced pancytopenia ([place])   Long term current use of anticoagulant therapy   HCAP (healthcare-associated pneumonia)    Severe sepsis Admitted initially to the ICU with low blood pressure of 88/47, has fever and HCAP. Did not require this pressor support, the hypotension improved after IV fluid hydration. This is treated with IV antibiotics and aggressive hydration with IV fluids. Sepsis physiology resolved.  HCAP Healthcare associated pneumonia with associated neutropenia/lymphopenia. Chest x-ray showed left-sided and bibasilar infiltrates consistent with pneumonia. Patient started on vancomycin and Zosyn on admission to the hospital. Continue supportive management with bronchodilators, mucolytics, antitussives and oxygen as needed. No changes to the respiratory regimen.  Pancytopenia Secondary to chemotherapy, patient receiving chemotherapy for stage IV lung cancer. Presented with WBC of 0.2, hemoglobin of 6.1 and platelets of 18. Overall improving. Patient received Neulasta after the chemotherapy.  Leukopenia With neutropenia and lymphopenia, secondary to recent chemotherapy. Patient received Neulasta after the chemotherapy, ANC was 0 on admission currently 16.2.  Atrial fibrillation Patient has  paroxysmal atrial fibrillation, had brief RVR on admission. Patient was on amiodarone drip, likely this was secondary to hypotension and sepsis. Patient is on Eliquis, currently on hold because of thrombocytopenia, will restart anticoagulation when platelets >50. This patients CHA2DS2-VASc Score and unadjusted Ischemic Stroke Rate (% per year) is equal to 3.2 % stroke rate/year from a score of 3 for diabetes and 2 points for age >42. Above score calculated as 1 point each if present [CHF, HTN, DM, Vascular=MI/PAD/Aortic Plaque, Age if 65-74, or Male] Above score calculated as 2 points each if present [Age > 75, or Stroke/TIA/TE]  Stage IV adenocarcinoma of the lung Patient follows with Dr. [name], currently receiving palliative chemotherapy.  Acute kidney injury Slow but progressive worsening of creatinine, hold Demadex and started IV fluids. Check BMP in a.m.  Code Status: Full Code Family Communication: Plan discussed with the patient. Disposition Plan:   Discharge on Saturday versus Sunday morning.  Diet: Diet Carb Modified Fluid consistency:: Thin; Room service appropriate?: Yes  Consultants:  None  Procedures:  None  Antibiotics:  None   Objective: Filed Vitals:   12/01/15 2159 12/02/15 0519  BP: 132/69 123/74  Pulse: 89 84  Temp:  98.4 F (36.9 C)  Resp:  20    Intake/Output Summary (Last 24 hours) at 12/02/15 1059 Last data filed at 12/02/15 1000  Gross per 24 hour  Intake   3525 ml  Output    950 ml  Net   2575 ml   Filed Weights   11/30/15 0558 12/01/15 0520 12/02/15 0519  Weight: 86.4 kg (190 lb 7.6 oz) 86 kg (189 lb 9.5 oz) 86.2 kg (190 lb 0.6 oz)    Exam: General: Alert and awake, oriented x3, not in any acute distress. HEENT: anicteric sclera, pupils reactive to light and accommodation, EOMI  CVS: S1-S2 clear, no murmur rubs or gallops Chest: clear to auscultation bilaterally, no wheezing, rales or rhonchi Abdomen: soft nontender,  nondistended, normal bowel sounds, no organomegaly Extremities: no cyanosis, clubbing or edema noted bilaterally Neuro: Cranial nerves II-XII intact, no focal neurological deficits  Data Reviewed: Basic Metabolic Panel:  Recent Labs Lab 11/27/15 0550  11/28/15 0740 11/29/15 0600 11/30/15 0350 12/01/15 0403 12/02/15 0620  NA 142  < > 137 137 138 136 140  K 2.1*  < > 5.2* 4.3 3.7 4.3 4.1  CL 122*  < > 106 106 108 105 108  CO2 13*  < > '24 24 25 25 26  '$ GLUCOSE 93  < > 195* 181* 140* 116* 131*  BUN 28*  < > 41* 44* 40* 39* 25*  CREATININE 0.75  < > 1.10 1.27* 1.24 1.37* 1.08  CALCIUM 4.4*  < > 7.6* 7.9* 7.7* 7.7* 7.5*  MG 0.8*  --  1.5* 2.2  --   --   --   PHOS  --   --  2.5  --   --   --   --   < > = values in this interval not displayed. Liver Function Tests:  Recent Labs Lab 11/27/15 0550  AST 14*  ALT 15*  ALKPHOS 24*  BILITOT 0.9  PROT <3.0*  ALBUMIN 1.3*   No results for input(s): LIPASE, AMYLASE in the last 168 hours. No results for input(s): AMMONIA in the last 168 hours. CBC:  Recent Labs Lab 11/27/15 0550 11/28/15 0740 11/29/15 0600 11/30/15 0350 12/01/15 0403 12/02/15 0620  WBC 0.2* 0.1* 1.4* 5.0 11.5* 17.8*  NEUTROABS 0.0*  --  0.9* 4.4 10.3* 16.2*  HGB 6.1* 10.9* 11.3* 10.3* 10.3* 10.5*  HCT 19.1* 33.6* 34.3* 32.5* 32.5* 33.8*  MCV 92.7 90.3 88.2 88.8 91.3 93.6  PLT 18* 27* 37* 38* 26* 31*   Cardiac Enzymes:  Recent Labs Lab 11/27/15 0550 11/27/15 1654 11/27/15 2244  TROPONINI <0.03 <0.03 <0.03   BNP (last 3 results)  Recent Labs  11/27/15 0550  BNP 128.7*    ProBNP (last 3 results)  Recent Labs  08/18/15 1245  PROBNP 222.0*    CBG:  Recent Labs Lab 11/28/15 0024 11/28/15 0612 11/28/15 0744 11/28/15 1228 11/28/15 1704  GLUCAP 203* 158* 160* 190* 186*    Micro Recent Results (from the past 240 hour(s))  Blood culture (routine x 2)     Status: None (Preliminary result)   Collection Time: 11/27/15  5:50 AM  Result  Value Ref Range Status   Specimen Description BLOOD LEFT ANTECUBITAL  Final   Special Requests BOTTLES DRAWN AEROBIC AND ANAEROBIC 5ML  Final   Culture   Final    NO GROWTH 4 DAYS Performed at [facility]    Report Status PENDING  Incomplete  Urine culture     Status: None   Collection Time: 11/27/15  5:57 AM  Result Value Ref Range Status   Specimen Description URINE, CATHETERIZED  Final   Special Requests NONE  Final   Culture   Final    NO GROWTH 1 DAY Performed at [facility]    Report Status 11/28/2015 FINAL  Final  Blood culture (routine x 2)     Status: None (Preliminary result)   Collection Time: 11/27/15  9:00 AM  Result Value Ref Range Status   Specimen Description BLOOD RIGHT PORTA CATH  Final   Special Requests BOTTLES DRAWN AEROBIC AND ANAEROBIC 5ML  Final  Culture   Final    NO GROWTH 4 DAYS Performed at [facility]    Report Status PENDING  Incomplete  MRSA PCR Screening     Status: None   Collection Time: 11/27/15  9:25 AM  Result Value Ref Range Status   MRSA by PCR NEGATIVE NEGATIVE Final    Comment:        The GeneXpert MRSA Assay (FDA approved for NASAL specimens only), is one component of a comprehensive MRSA colonization surveillance program. It is not intended to diagnose MRSA infection nor to guide or monitor treatment for MRSA infections.   Respiratory virus panel     Status: None   Collection Time: 11/27/15 11:21 AM  Result Value Ref Range Status   Source - RVPAN NOSE  Corrected   Respiratory Syncytial Virus A Negative Negative Final   Respiratory Syncytial Virus B Negative Negative Final   Influenza A Negative Negative Final   Influenza B Negative Negative Final   Parainfluenza 1 Negative Negative Final   Parainfluenza 2 Negative Negative Final   Parainfluenza 3 Negative Negative Final   Metapneumovirus Negative Negative Final   Rhinovirus Negative Negative Final   Adenovirus Negative Negative Final     Comment: (NOTE) Performed At: [facility] [place], [place] [id number] Lindon Romp MD HW:[id number]      Studies: No results found.  Scheduled Meds: . antiseptic oral rinse  7 mL Mouth Rinse BID  . guaiFENesin  1,200 mg Oral BID  . magic mouthwash w/lidocaine  5 mL Oral TID  . metoprolol tartrate  12.5 mg Oral BID  . pantoprazole  40 mg Oral Daily  . piperacillin-tazobactam (ZOSYN)  IV  3.375 g Intravenous Q8H  . polyethylene glycol  17 g Oral Daily  . vancomycin  750 mg Intravenous Q24H   Continuous Infusions:       Time spent: 35 minutes    [facility]  [facility] Pager [phone] If 7PM-7AM, please contact night-coverage at [email or web address], password [facility] 12/02/2015, 10:59 AM  LOS: 5 days

## 2015-12-02 NOTE — Care Management Note (Signed)
Case Management Note  Patient Details  Name: Shawn Espinoza MRN: [id number] Date of Birth: 04-May-1941  Subjective/Objective:   Severe sepsis, HCAP                   Action/Plan: NCM spoke to wife, Shawn Espinoza, # [phone]. States she prefers SNF-rehab at [facility]. NCM referral to CSW for SNF placement.   Expected Discharge Date:  12/04/2015               Expected Discharge Plan:  [place]  In-House Referral:  Clinical Social Work  Discharge planning Services  CM Consult  Post Acute Care Choice:  NA Choice offered to:  NA   Status of Service:  Completed, signed off  Medicare Important Message Given:  Yes Date Medicare IM Given:    Medicare IM give by:    Date Additional Medicare IM Given:    Additional Medicare Important Message give by:     If discussed at [place] of Stay Meetings, dates discussed:    Additional Comments:  Erenest Rasher, RN 12/02/2015, 10:30 AM

## 2015-12-02 NOTE — Clinical Social Work Placement (Signed)
   CLINICAL SOCIAL WORK PLACEMENT  NOTE  Date:  12/02/2015  Patient Details  Name: Shawn Espinoza MRN: [id number] Date of Birth: Jun 26, 1941  Clinical Social Work is seeking post-discharge placement for this patient at the [place] level of care (*CSW will initial, date and re-position this form in  chart as items are completed):  No   Patient/family provided with [place] Work Department's list of facilities offering this level of care within the geographic area requested by the patient (or if unable, by the patient's family).  Yes   Patient/family informed of their freedom to choose among providers that offer the needed level of care, that participate in Medicare, Medicaid or managed care program needed by the patient, have an available bed and are willing to accept the patient.  Yes   Patient/family informed of [facility]'s ownership interest in [facility] and [facility], as well as of the fact that they are under no obligation to receive care at these facilities.  PASRR submitted to EDS on 12/02/15     PASRR number received on 12/02/15     Existing PASRR number confirmed on       FL2 transmitted to all facilities in geographic area requested by pt/family on 12/02/15     FL2 transmitted to all facilities within larger geographic area on       Patient informed that his/her managed care company has contracts with or will negotiate with certain facilities, including the following:            Patient/family informed of bed offers received.  Patient chooses bed at       Physician recommends and patient chooses bed at      Patient to be transferred to   on  .  Patient to be transferred to facility by       Patient family notified on   of transfer.  Name of family member notified:        PHYSICIAN       Additional Comment:    _______________________________________________ Roanna Raider, LCSW 12/02/2015, 3:12 PM

## 2015-12-02 NOTE — NC FL2 (Signed)
[place] LEVEL OF CARE SCREENING TOOL     IDENTIFICATION  Patient Name: Shawn Espinoza Birthdate: 1941-01-22 Sex: male Admission Date (Current Location): 11/27/2015  [facility] and [place] Number:  ([place])   Facility and Address:  [facility],  [place] [address], [place]      Provider Number: [id number]  Attending Physician Name and Address:  Verlee Monte, MD  Relative Name and Phone Number:       Current Level of Care: Hospital Recommended Level of Care: [place] Prior Approval Number:    Date Approved/Denied:   PASRR Number:    Discharge Plan: SNF    Current Diagnoses: Patient Active Problem List   Diagnosis Date Noted  . HCAP (healthcare-associated pneumonia) 11/27/2015  . Septic shock ([place]) 11/27/2015  . Hypoglycemia 10/18/2015  . Lobar pneumonia ([place]) 10/18/2015  . Aortic stenosis, moderate 10/18/2015  . Antineoplastic chemotherapy induced anemia 10/18/2015  . Sacral pressure ulcer 10/18/2015  . Hypoalbuminemia due to protein-calorie malnutrition ([place]) 10/16/2015  . Full code status 09/26/2015  . Epistaxis 08/22/2015  . Dehydration 08/22/2015  . Esophagitis 08/08/2015  . Altered mental state 08/02/2015  . Constipation 08/02/2015  . Long term current use of anticoagulant therapy 08/02/2015  . Neutropenia, drug-induced ([place]) 07/31/2015  . Myalgia 07/20/2015  . Encounter for antineoplastic chemotherapy 07/16/2015  . Bilateral leg pain 06/14/2015  . Antineoplastic chemotherapy induced pancytopenia ([place]) 05/05/2015  . Advanced care planning/counseling discussion 03/30/2015  . Thrush 02/24/2015  . Hearing loss 02/24/2015  . Atrial fibrillation, unspecified 01/23/2015  . Hypothyroid 01/23/2015  . Neoplastic malignant related fatigue 01/10/2015  . Orthostatic hypotension 01/04/2015  . Peripheral edema 12/01/2014  . Protein-calorie malnutrition ([place]) 12/01/2014  . Thrombocytopenia ([place]) 11/23/2014  .  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 CHF (congestive heart failure) ([place]) 10/07/2014  . Other pancytopenia ([place]) 10/04/2014  . Skin rash 09/05/2014  . Recurrent left pleural effusion 08/13/2014  . Liver lesion 03/24/2014  . Adenocarcinoma of left lung, stage 4 ([place]) 03/21/2014  . DOE (dyspnea on exertion) 02/22/2014  . Personal history of colonic adenoma 07/06/2013  . Right knee pain 11/18/2012  . BPH (benign prostatic hypertrophy) 09/09/2012  . Polycythemia 08/19/2012  . Medicare annual wellness visit, subsequent 03/04/2012  . Positive hepatitis C antibody test   . Systolic murmur   . Lower back pain   . 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 HLD (hyperlipidemia) 06/09/2007  . History of tobacco use 06/09/2007  . Essential hypertension 06/09/2007  . Hemorrhoids 06/09/2007  . DIVERTICULOSIS, COLON 06/09/2007  . FATTY LIVER DISEASE 06/09/2007    Orientation RESPIRATION BLADDER Height & Weight    Self, Time, Situation, Place  Normal Continent '5\' 9"'$  (175.3 cm) 190 lbs.  BEHAVIORAL SYMPTOMS/MOOD NEUROLOGICAL BOWEL NUTRITION STATUS      Continent Diet (carb modified)  AMBULATORY STATUS COMMUNICATION OF NEEDS Skin   Limited Assist Verbally Normal                       Personal Care Assistance Level of Assistance  Bathing, Dressing Bathing Assistance: Limited assistance   Dressing Assistance: Limited assistance     Functional Limitations Info             SPECIAL CARE FACTORS FREQUENCY  PT (By licensed PT), OT (By licensed OT)                    Contractures Contractures Info: Not present    Additional  Factors Info                  Current Medications (12/02/2015):  This is the current hospital active medication list Current Facility-Administered Medications  Medication Dose Route Frequency Provider Last Rate Last Dose  . acetaminophen (TYLENOL) tablet 650 mg  650 mg Oral Q4H PRN Rigoberto Noel, MD   650 mg at 12/02/15 3825   . albuterol (PROVENTIL) (2.5 MG/3ML) 0.083% nebulizer solution 2.5 mg  2.5 mg Nebulization Q4H PRN Erick Colace, NP   2.5 mg at 11/29/15 1637  . antiseptic oral rinse (CPC / CETYLPYRIDINIUM CHLORIDE 0.05%) solution 7 mL  7 mL Mouth Rinse BID Kara Mead V, MD   7 mL at 12/01/15 2201  . guaiFENesin (MUCINEX) 12 hr tablet 1,200 mg  1,200 mg Oral BID Verlee Monte, MD   1,200 mg at 12/02/15 1049  . guaiFENesin-dextromethorphan (ROBITUSSIN DM) 100-10 MG/5ML syrup 5 mL  5 mL Oral Q4H PRN Gardiner Barefoot, NP   5 mL at 12/01/15 2340  . magic mouthwash w/lidocaine  5 mL Oral TID Verlee Monte, MD   5 mL at 11/29/15 2147  . metoprolol tartrate (LOPRESSOR) tablet 12.5 mg  12.5 mg Oral BID Mikael Spray, NP   12.5 mg at 12/02/15 1049  . ondansetron (ZOFRAN) injection 4 mg  4 mg Intravenous Q6H PRN Rigoberto Noel, MD   4 mg at 11/27/15 1453  . pantoprazole (PROTONIX) EC tablet 40 mg  40 mg Oral Daily Mikael Spray, NP   40 mg at 12/02/15 1049  . piperacillin-tazobactam (ZOSYN) IVPB 3.375 g  3.375 g Intravenous Q8H Adrian Saran, RPH   3.375 g at 12/02/15 0849  . polyethylene glycol (MIRALAX / GLYCOLAX) packet 17 g  17 g Oral Daily Verlee Monte, MD   17 g at 11/30/15 0955  . sodium chloride 0.9 % injection 10-40 mL  10-40 mL Intracatheter PRN Verlee Monte, MD   10 mL at 12/01/15 0402  . vancomycin (VANCOCIN) IVPB 750 mg/150 ml premix  750 mg Intravenous Q24H Adrian Saran, RPH   750 mg at 12/02/15 0539     Discharge Medications: Please see discharge summary for a list of discharge medications.  Relevant Imaging Results:  Relevant Lab Results:   Additional Information    Stefania Goulart M, LCSW

## 2015-12-03 LAB — CBC WITH DIFFERENTIAL/PLATELET
Basophils Absolute: 0 10*3/uL (ref 0.0–0.1)
Basophils Relative: 0 %
EOS ABS: 0 10*3/uL (ref 0.0–0.7)
EOS PCT: 0 %
HCT: 34.5 % — ABNORMAL LOW (ref 39.0–52.0)
Hemoglobin: 10.7 g/dL — ABNORMAL LOW (ref 13.0–17.0)
LYMPHS ABS: 0.6 10*3/uL — AB (ref 0.7–4.0)
LYMPHS PCT: 4 %
MCH: 29.7 pg (ref 26.0–34.0)
MCHC: 31 g/dL (ref 30.0–36.0)
MCV: 95.8 fL (ref 78.0–100.0)
MONO ABS: 0.6 10*3/uL (ref 0.1–1.0)
Monocytes Relative: 4 %
Neutro Abs: 13.4 10*3/uL — ABNORMAL HIGH (ref 1.7–7.7)
Neutrophils Relative %: 92 %
PLATELETS: 31 10*3/uL — AB (ref 150–400)
RBC: 3.6 MIL/uL — ABNORMAL LOW (ref 4.22–5.81)
RDW: 17.1 % — AB (ref 11.5–15.5)
WBC: 14.7 10*3/uL — ABNORMAL HIGH (ref 4.0–10.5)

## 2015-12-03 LAB — BASIC METABOLIC PANEL
Anion gap: 5 (ref 5–15)
BUN: 16 mg/dL (ref 6–20)
CHLORIDE: 107 mmol/L (ref 101–111)
CO2: 26 mmol/L (ref 22–32)
CREATININE: 0.81 mg/dL (ref 0.61–1.24)
Calcium: 7.6 mg/dL — ABNORMAL LOW (ref 8.9–10.3)
GFR calc Af Amer: >60 mL/min (ref >60)
GFR calc non Af Amer: >60 mL/min (ref >60)
GLUCOSE: 105 mg/dL — AB (ref 65–99)
POTASSIUM: 3.9 mmol/L (ref 3.5–5.1)
SODIUM: 138 mmol/L (ref 135–145)

## 2015-12-03 MED ORDER — TRAMADOL HCL 50 MG PO TABS
50.0000 mg | ORAL_TABLET | Freq: Once | ORAL | Status: AC
  Administered 2015-12-03: 50 mg via ORAL
  Filled 2015-12-03: qty 1

## 2015-12-03 MED ORDER — TORSEMIDE 20 MG PO TABS
20.0000 mg | ORAL_TABLET | Freq: Every day | ORAL | Status: DC
  Administered 2015-12-03 – 2015-12-04 (×2): 20 mg via ORAL
  Filled 2015-12-03 (×2): qty 1

## 2015-12-03 MED ORDER — ALPRAZOLAM 0.25 MG PO TABS
0.2500 mg | ORAL_TABLET | Freq: Once | ORAL | Status: AC
  Administered 2015-12-03: 0.25 mg via ORAL

## 2015-12-03 NOTE — Clinical Social Work Note (Signed)
Bed offers given to patient and wife. CSW encouraged patient and family to be ready to provide decision on bed in the AM.   Liz Beach MSW, [place], [place], [phone]

## 2015-12-03 NOTE — Progress Notes (Signed)
[facility] PROGRESS NOTE   Shawn Espinoza YTW:[id number] DOB: 1941-05-07 DOA: 11/27/2015 PCP: [name], MD  HPI/Subjective: Seen with wife at bedside, about the same no new complaints. To nursing home in a.m.   Assessment/Plan: Principal Problem:   Septic shock (HCC) Active Problems:   Positive hepatitis C antibody test   Adenocarcinoma of left lung, stage 4 (HCC)   Thrush   Antineoplastic chemotherapy induced pancytopenia ([place])   Long term current use of anticoagulant therapy   HCAP (healthcare-associated pneumonia)    Severe sepsis Admitted initially to the ICU with low blood pressure of 88/47, has fever and HCAP. Did not require this pressor support, the hypotension improved after IV fluid hydration. This is treated with IV antibiotics and aggressive hydration with IV fluids. Sepsis physiology resolved.  HCAP Healthcare associated pneumonia with associated neutropenia/lymphopenia. Chest x-ray showed left-sided and bibasilar infiltrates consistent with pneumonia. Patient started on vancomycin and Zosyn on admission to the hospital. Continue supportive management with bronchodilators, mucolytics, antitussives and oxygen as needed. No changes to the respiratory regimen.  Pancytopenia Secondary to chemotherapy, patient receiving chemotherapy for stage IV lung cancer. Presented with WBC of 0.2, hemoglobin of 6.1 and platelets of 18. Overall improving. Patient received Neulasta after the chemotherapy.  Leukopenia With neutropenia and lymphopenia, secondary to recent chemotherapy. Patient received Neulasta after the chemotherapy, ANC was 0 on admission currently 16.2.  Atrial fibrillation Patient has paroxysmal atrial fibrillation, had brief RVR on admission. Patient was on amiodarone drip, likely this was secondary to hypotension and sepsis. Patient is on Eliquis, currently on hold because of thrombocytopenia, will restart anticoagulation when  platelets >50. This patients CHA2DS2-VASc Score and unadjusted Ischemic Stroke Rate (% per year) is equal to 3.2 % stroke rate/year from a score of 3 for diabetes and 2 points for age >16. Above score calculated as 1 point each if present [CHF, HTN, DM, Vascular=MI/PAD/Aortic Plaque, Age if 65-74, or Male] Above score calculated as 2 points each if present [Age > 75, or Stroke/TIA/TE]  Stage IV adenocarcinoma of the lung Patient follows with Dr. [name], currently receiving palliative chemotherapy.  Acute kidney injury Demadex held and given some IV fluids. This is resolved, creatinine went up to 1.37, creatinine today 0.8. I will restart his home dose of Demadex as he has trace to +1 edema.  Code Status: Full Code Family Communication: Plan discussed with the patient. Disposition Plan:   Discharge on Saturday versus 'Sunday morning.  Diet: Diet Carb Modified Fluid consistency:: Thin; Room service appropriate?: Yes  Consultants:  None  Procedures:  None  Antibiotics:  None   Objective: Filed Vitals:   12/02/15 1947 12/03/15 0447  BP: 139/75 133/86  Pulse: 88 82  Temp: 97.7 F (36.5 C) 97.7 F (36.5 C)  Resp: 24 28    Intake/Output Summary (Last 24 hours) at 12/03/15 1001 Last data filed at 12/03/15 0700  Gross per 24 hour  Intake   1020 ml  Output    925 ml  Net     95'$  ml   Filed Weights   12/01/15 0520 12/02/15 0519 12/03/15 0447  Weight: 86 kg (189 lb 9.5 oz) 86.2 kg (190 lb 0.6 oz) 83.5 kg (184 lb 1.4 oz)    Exam: General: Alert and awake, oriented x3, not in any acute distress. HEENT: anicteric sclera, pupils reactive to light and accommodation, EOMI CVS: S1-S2 clear, no murmur rubs or gallops Chest: clear to auscultation bilaterally, no wheezing, rales or rhonchi Abdomen: soft nontender, nondistended,  normal bowel sounds, no organomegaly Extremities: no cyanosis, clubbing or edema noted bilaterally Neuro: Cranial nerves II-XII intact, no focal  neurological deficits  Data Reviewed: Basic Metabolic Panel:  Recent Labs Lab 11/27/15 0550  11/28/15 0740 11/29/15 0600 11/30/15 0350 12/01/15 0403 12/02/15 0620 12/03/15 0500  NA 142  < > 137 137 138 136 140 138  K 2.1*  < > 5.2* 4.3 3.7 4.3 4.1 3.9  CL 122*  < > 106 106 108 105 108 107  CO2 13*  < > '24 24 25 25 26 26  '$ GLUCOSE 93  < > 195* 181* 140* 116* 131* 105*  BUN 28*  < > 41* 44* 40* 39* 25* 16  CREATININE 0.75  < > 1.10 1.27* 1.24 1.37* 1.08 0.81  CALCIUM 4.4*  < > 7.6* 7.9* 7.7* 7.7* 7.5* 7.6*  MG 0.8*  --  1.5* 2.2  --   --   --   --   PHOS  --   --  2.5  --   --   --   --   --   < > = values in this interval not displayed. Liver Function Tests:  Recent Labs Lab 11/27/15 0550  AST 14*  ALT 15*  ALKPHOS 24*  BILITOT 0.9  PROT <3.0*  ALBUMIN 1.3*   No results for input(s): LIPASE, AMYLASE in the last 168 hours. No results for input(s): AMMONIA in the last 168 hours. CBC:  Recent Labs Lab 11/29/15 0600 11/30/15 0350 12/01/15 0403 12/02/15 0620 12/03/15 0500  WBC 1.4* 5.0 11.5* 17.8* 14.7*  NEUTROABS 0.9* 4.4 10.3* 16.2* 13.4*  HGB 11.3* 10.3* 10.3* 10.5* 10.7*  HCT 34.3* 32.5* 32.5* 33.8* 34.5*  MCV 88.2 88.8 91.3 93.6 95.8  PLT 37* 38* 26* 31* 31*   Cardiac Enzymes:  Recent Labs Lab 11/27/15 0550 11/27/15 1654 11/27/15 2244  TROPONINI <0.03 <0.03 <0.03   BNP (last 3 results)  Recent Labs  11/27/15 0550  BNP 128.7*    ProBNP (last 3 results)  Recent Labs  08/18/15 1245  PROBNP 222.0*    CBG:  Recent Labs Lab 11/28/15 0024 11/28/15 0612 11/28/15 0744 11/28/15 1228 11/28/15 1704  GLUCAP 203* 158* 160* 190* 186*    Micro Recent Results (from the past 240 hour(s))  Blood culture (routine x 2)     Status: None   Collection Time: 11/27/15  5:50 AM  Result Value Ref Range Status   Specimen Description BLOOD LEFT ANTECUBITAL  Final   Special Requests BOTTLES DRAWN AEROBIC AND ANAEROBIC 5ML  Final   Culture   Final     NO GROWTH 5 DAYS Performed at [facility]    Report Status 12/02/2015 FINAL  Final  Urine culture     Status: None   Collection Time: 11/27/15  5:57 AM  Result Value Ref Range Status   Specimen Description URINE, CATHETERIZED  Final   Special Requests NONE  Final   Culture   Final    NO GROWTH 1 DAY Performed at [facility]    Report Status 11/28/2015 FINAL  Final  Blood culture (routine x 2)     Status: None   Collection Time: 11/27/15  9:00 AM  Result Value Ref Range Status   Specimen Description BLOOD RIGHT PORTA CATH  Final   Special Requests BOTTLES DRAWN AEROBIC AND ANAEROBIC 5ML  Final   Culture   Final    NO GROWTH 5 DAYS Performed at [facility]  Report Status 12/02/2015 FINAL  Final  MRSA PCR Screening     Status: None   Collection Time: 11/27/15  9:25 AM  Result Value Ref Range Status   MRSA by PCR NEGATIVE NEGATIVE Final    Comment:        The GeneXpert MRSA Assay (FDA approved for NASAL specimens only), is one component of a comprehensive MRSA colonization surveillance program. It is not intended to diagnose MRSA infection nor to guide or monitor treatment for MRSA infections.   Respiratory virus panel     Status: None   Collection Time: 11/27/15 11:21 AM  Result Value Ref Range Status   Source - RVPAN NOSE  Corrected   Respiratory Syncytial Virus A Negative Negative Final   Respiratory Syncytial Virus B Negative Negative Final   Influenza A Negative Negative Final   Influenza B Negative Negative Final   Parainfluenza 1 Negative Negative Final   Parainfluenza 2 Negative Negative Final   Parainfluenza 3 Negative Negative Final   Metapneumovirus Negative Negative Final   Rhinovirus Negative Negative Final   Adenovirus Negative Negative Final    Comment: (NOTE) Performed At: [facility] [place], [place] [id number] Lindon Romp MD DP:[id number]      Studies: No results found.  Scheduled  Meds: . antiseptic oral rinse  7 mL Mouth Rinse BID  . guaiFENesin  1,200 mg Oral BID  . magic mouthwash w/lidocaine  5 mL Oral TID  . metoprolol tartrate  12.5 mg Oral BID  . pantoprazole  40 mg Oral Daily  . piperacillin-tazobactam (ZOSYN)  IV  3.375 g Intravenous Q8H  . polyethylene glycol  17 g Oral Daily  . vancomycin  750 mg Intravenous Q24H   Continuous Infusions:       Time spent: 35 minutes    [facility]  [facility] Pager [phone] If 7PM-7AM, please contact night-coverage at [email or web address], password [facility] 12/03/2015, 10:01 AM  LOS: 6 days

## 2015-12-04 ENCOUNTER — Telehealth: Payer: Self-pay | Admitting: Medical Oncology

## 2015-12-04 DIAGNOSIS — N5089 Other specified disorders of the male genital organs: Secondary | ICD-10-CM | POA: Diagnosis not present

## 2015-12-04 DIAGNOSIS — R945 Abnormal results of liver function studies: Secondary | ICD-10-CM | POA: Diagnosis not present

## 2015-12-04 DIAGNOSIS — D61810 Antineoplastic chemotherapy induced pancytopenia: Secondary | ICD-10-CM | POA: Diagnosis not present

## 2015-12-04 DIAGNOSIS — M6281 Muscle weakness (generalized): Secondary | ICD-10-CM | POA: Diagnosis not present

## 2015-12-04 DIAGNOSIS — K769 Liver disease, unspecified: Secondary | ICD-10-CM | POA: Diagnosis not present

## 2015-12-04 DIAGNOSIS — R530 Neoplastic (malignant) related fatigue: Secondary | ICD-10-CM | POA: Diagnosis not present

## 2015-12-04 DIAGNOSIS — R05 Cough: Secondary | ICD-10-CM | POA: Diagnosis not present

## 2015-12-04 DIAGNOSIS — C3411 Malignant neoplasm of upper lobe, right bronchus or lung: Secondary | ICD-10-CM | POA: Diagnosis not present

## 2015-12-04 DIAGNOSIS — R609 Edema, unspecified: Secondary | ICD-10-CM | POA: Diagnosis not present

## 2015-12-04 DIAGNOSIS — K209 Esophagitis, unspecified: Secondary | ICD-10-CM | POA: Diagnosis not present

## 2015-12-04 DIAGNOSIS — R6521 Severe sepsis with septic shock: Secondary | ICD-10-CM | POA: Diagnosis not present

## 2015-12-04 DIAGNOSIS — L89153 Pressure ulcer of sacral region, stage 3: Secondary | ICD-10-CM | POA: Diagnosis not present

## 2015-12-04 DIAGNOSIS — R1314 Dysphagia, pharyngoesophageal phase: Secondary | ICD-10-CM | POA: Diagnosis not present

## 2015-12-04 DIAGNOSIS — A419 Sepsis, unspecified organism: Secondary | ICD-10-CM | POA: Diagnosis not present

## 2015-12-04 DIAGNOSIS — B379 Candidiasis, unspecified: Secondary | ICD-10-CM | POA: Diagnosis not present

## 2015-12-04 DIAGNOSIS — R601 Generalized edema: Secondary | ICD-10-CM | POA: Diagnosis not present

## 2015-12-04 DIAGNOSIS — B1920 Unspecified viral hepatitis C without hepatic coma: Secondary | ICD-10-CM | POA: Diagnosis not present

## 2015-12-04 DIAGNOSIS — Z7901 Long term (current) use of anticoagulants: Secondary | ICD-10-CM | POA: Diagnosis not present

## 2015-12-04 DIAGNOSIS — Z79899 Other long term (current) drug therapy: Secondary | ICD-10-CM | POA: Diagnosis not present

## 2015-12-04 DIAGNOSIS — R0609 Other forms of dyspnea: Secondary | ICD-10-CM | POA: Diagnosis not present

## 2015-12-04 DIAGNOSIS — D72828 Other elevated white blood cell count: Secondary | ICD-10-CM | POA: Diagnosis not present

## 2015-12-04 DIAGNOSIS — I4891 Unspecified atrial fibrillation: Secondary | ICD-10-CM | POA: Diagnosis not present

## 2015-12-04 DIAGNOSIS — C3490 Malignant neoplasm of unspecified part of unspecified bronchus or lung: Secondary | ICD-10-CM | POA: Diagnosis not present

## 2015-12-04 DIAGNOSIS — J90 Pleural effusion, not elsewhere classified: Secondary | ICD-10-CM | POA: Diagnosis not present

## 2015-12-04 DIAGNOSIS — N39498 Other specified urinary incontinence: Secondary | ICD-10-CM | POA: Diagnosis not present

## 2015-12-04 DIAGNOSIS — E119 Type 2 diabetes mellitus without complications: Secondary | ICD-10-CM | POA: Diagnosis not present

## 2015-12-04 DIAGNOSIS — C3492 Malignant neoplasm of unspecified part of left bronchus or lung: Secondary | ICD-10-CM | POA: Diagnosis not present

## 2015-12-04 DIAGNOSIS — I480 Paroxysmal atrial fibrillation: Secondary | ICD-10-CM | POA: Diagnosis not present

## 2015-12-04 DIAGNOSIS — J189 Pneumonia, unspecified organism: Secondary | ICD-10-CM | POA: Diagnosis not present

## 2015-12-04 LAB — CBC WITH DIFFERENTIAL/PLATELET
BASOS PCT: 0 %
Basophils Absolute: 0 10*3/uL (ref 0.0–0.1)
EOS ABS: 0 10*3/uL (ref 0.0–0.7)
EOS PCT: 0 %
HEMATOCRIT: 33.5 % — AB (ref 39.0–52.0)
HEMOGLOBIN: 10.2 g/dL — AB (ref 13.0–17.0)
LYMPHS PCT: 5 %
Lymphs Abs: 0.7 10*3/uL (ref 0.7–4.0)
MCH: 29.2 pg (ref 26.0–34.0)
MCHC: 30.4 g/dL (ref 30.0–36.0)
MCV: 96 fL (ref 78.0–100.0)
MONOS PCT: 4 %
Monocytes Absolute: 0.6 10*3/uL (ref 0.1–1.0)
NEUTROS ABS: 12.8 10*3/uL — AB (ref 1.7–7.7)
NEUTROS PCT: 91 %
Platelets: 30 10*3/uL — ABNORMAL LOW (ref 150–400)
RBC: 3.49 MIL/uL — ABNORMAL LOW (ref 4.22–5.81)
RDW: 17.1 % — ABNORMAL HIGH (ref 11.5–15.5)
WBC: 14.1 10*3/uL — ABNORMAL HIGH (ref 4.0–10.5)

## 2015-12-04 LAB — BASIC METABOLIC PANEL
ANION GAP: 4 — AB (ref 5–15)
BUN: 14 mg/dL (ref 6–20)
CHLORIDE: 108 mmol/L (ref 101–111)
CO2: 27 mmol/L (ref 22–32)
Calcium: 7.4 mg/dL — ABNORMAL LOW (ref 8.9–10.3)
Creatinine, Ser: 0.82 mg/dL (ref 0.61–1.24)
GFR calc non Af Amer: >60 mL/min (ref >60)
GLUCOSE: 131 mg/dL — AB (ref 65–99)
POTASSIUM: 3.8 mmol/L (ref 3.5–5.1)
Sodium: 139 mmol/L (ref 135–145)

## 2015-12-04 MED ORDER — HEPARIN SOD (PORK) LOCK FLUSH 100 UNIT/ML IV SOLN
500.0000 [IU] | INTRAVENOUS | Status: AC | PRN
  Administered 2015-12-04: 500 [IU]

## 2015-12-04 MED ORDER — LEVOFLOXACIN 750 MG PO TABS: 750.0000 mg | ORAL_TABLET | Freq: Every day | ORAL | Status: DC

## 2015-12-04 MED ORDER — APIXABAN 5 MG PO TABS
5.0000 mg | ORAL_TABLET | Freq: Two times a day (BID) | ORAL | Status: DC
Start: 2015-12-04 — End: 2016-10-31

## 2015-12-04 MED ORDER — TRAMADOL HCL 50 MG PO TABS: 50.0000 mg | ORAL_TABLET | Freq: Four times a day (QID) | ORAL | Status: DC | PRN

## 2015-12-04 NOTE — Discharge Summary (Signed)
Physician Discharge Summary  Shawn Espinoza JSH:[id number] DOB: [birth year] DOA: 11/27/2015  PCP: [name], MD  Admit date: 11/27/2015 Discharge date: 12/04/2015  Time spent: 40 minutes  Recommendations for Outpatient Follow-up:  1. Follow up with the nursing home MD 2. Levaquin for 5 more days. 3. Hold Eliquis and restart when platelets >50.   Discharge Diagnoses:  Principal Problem:   Septic shock ([place]) Active Problems:   Positive hepatitis C antibody test   Adenocarcinoma of left lung, stage 4 (HCC)   Thrush   Antineoplastic chemotherapy induced pancytopenia ([place])   Long term current use of anticoagulant therapy   HCAP (healthcare-associated pneumonia)   Discharge Condition: Stable  Diet recommendation: Heart healthy  Filed Weights   12/02/15 0519 12/03/15 0447 12/04/15 0650  Weight: 86.2 kg (190 lb 0.6 oz) 83.5 kg (184 lb 1.4 oz) 83.3 kg (183 lb 10.3 oz)    History of present illness:  This is a 74 year old male w/ h/o stage IV adenocarcinoma of the lung. Now undergoing treatment w/ Docetaxel and Cyramza for disease progression w/ [name]. He was in his usual state of health until around 12/3 when he felt fairly sudden onset of nasal congestion, fatigue, fever, chills, cough productive of yellow sputum, and upper airway congestion. This progressed over the following day and he was noting increasing dyspnea on 12/4 and eventually presented to the ED on 12/5 after he fell at home and had worsening dyspnea. On initial eval he was hypoxic, w/ room air sats 86%, he was febrile and hypotensive w/ initial lactic acid of 2.8. He was given 30 ml/kg crystalloid, cultures sent, abx initiated but remained hypotensive w/ AF w/ RVR. He was therefore admitted to the intensive care under critical care services.    Hospital Course:   Severe sepsis Admitted initially to the ICU with low blood pressure of 88/47, has fever and HCAP. Did not require this pressor support, the  hypotension improved after IV fluid hydration. This is treated with IV antibiotics and aggressive hydration with IV fluids. Sepsis physiology resolved.  HCAP Healthcare associated pneumonia with associated neutropenia/lymphopenia. Chest x-ray showed left-sided and bibasilar infiltrates consistent with pneumonia. Patient started on vancomycin and Zosyn on admission to the hospital. Continue supportive management with bronchodilators, mucolytics, antitussives and oxygen as needed. Discharge on 5 more days of levofloxacin.   Pancytopenia Secondary to chemotherapy, patient receiving chemotherapy for stage IV lung cancer. Presented with WBC of 0.2, hemoglobin of 6.1 and platelets of 18. Overall improving. Patient received Neulasta after the chemotherapy. Hemoglobin is 10.2 and platelets 30 on discharge.  Leukopenia With neutropenia and lymphopenia, secondary to recent chemotherapy. Patient received Neulasta after the chemotherapy, ANC was 0 on admission currently 12.8.Marland Kitchen  Atrial fibrillation Patient has paroxysmal atrial fibrillation, had brief RVR on admission. Patient was on amiodarone drip, likely this was secondary to hypotension and sepsis. Patient is on Eliquis, currently on hold because of thrombocytopenia, will restart anticoagulation when platelets >50. This patients CHA2DS2-VASc Score and unadjusted Ischemic Stroke Rate (% per year) is equal to 3.2 % stroke rate/year from a score of 3 for diabetes and 2 points for age >78. Above score calculated as 1 point each if present [CHF, HTN, DM, Vascular=MI/PAD/Aortic Plaque, Age if 65-74, or Male] Above score calculated as 2 points each if present [Age > 75, or Stroke/TIA/TE].  Stage IV adenocarcinoma of the lung Patient follows with Dr. [name], currently receiving palliative chemotherapy.  Acute kidney injury Demadex held and given some IV  fluids. This is resolved, creatinine went up to 1.37, creatinine today 0.8. This is resolved,  Demadex restarted.   Procedures:  None  Consultations:  Was under PCCM  Discharge Exam: Filed Vitals:   12/03/15 2042 12/04/15 0650  BP: 127/69 136/55  Pulse: 84 86  Temp: 97.8 F (36.6 C) 97.7 F (36.5 C)  Resp: 24 20   General: Alert and awake, oriented x3, not in any acute distress. HEENT: anicteric sclera, pupils reactive to light and accommodation, EOMI CVS: S1-S2 clear, no murmur rubs or gallops Chest: clear to auscultation bilaterally, no wheezing, rales or rhonchi Abdomen: soft nontender, nondistended, normal bowel sounds, no organomegaly Extremities: no cyanosis, clubbing or edema noted bilaterally Neuro: Cranial nerves II-XII intact, no focal neurological deficits  Discharge Instructions   Discharge Instructions    Diet - low sodium heart healthy    Complete by:  As directed      Increase activity slowly    Complete by:  As directed           Current Discharge Medication List    START taking these medications   Details  levofloxacin (LEVAQUIN) 750 MG tablet Take 1 tablet (750 mg total) by mouth daily. Qty: 5 tablet, Refills: 0      CONTINUE these medications which have CHANGED   Details  apixaban (ELIQUIS) 5 MG TABS tablet Take 1 tablet (5 mg total) by mouth 2 (two) times daily. Restart when platelets >50.    traMADol (ULTRAM) 50 MG tablet Take 1 tablet (50 mg total) by mouth every 6 (six) hours as needed. Qty: 10 tablet, Refills: 0      CONTINUE these medications which have NOT CHANGED   Details  albuterol (PROVENTIL HFA;VENTOLIN HFA) 108 (90 BASE) MCG/ACT inhaler Inhale 2 puffs into the lungs every 6 (six) hours as needed for wheezing or shortness of breath. Qty: 1 Inhaler, Refills: 3   Associated Diagnoses: Adenocarcinoma of left lung, stage 4 (HCC)    Alum & Mag Hydroxide-Simeth (MAGIC MOUTHWASH W/LIDOCAINE) SOLN Take 5 mLs by mouth 3 (three) times daily as needed for mouth pain. Qty: 5 mL, Refills: 0   Associated Diagnoses: Personal  history of malignant neoplasm of bronchus and lung    dexamethasone (DECADRON) 4 MG tablet 2 tab po bid, the day before, day of and day after chemotherapy every 3 weeks Qty: 40 tablet, Refills: 1    diphenoxylate-atropine (LOMOTIL) 2.5-0.025 MG per tablet Take 2 tablets by mouth 4 (four) times daily as needed for diarrhea or loose stools. Qty: 30 tablet, Refills: 0   Associated Diagnoses: Diarrhea    glipiZIDE (GLUCOTROL) 5 MG tablet Take 1 tablet (5 mg total) by mouth daily with breakfast. Qty: 10 tablet, Refills: 0    guaiFENesin (MUCINEX) 600 MG 12 hr tablet Take 600 mg by mouth 2 (two) times daily as needed for cough or to loosen phlegm.     hydrocortisone (ANUSOL-HC) 25 MG suppository Place 1 suppository (25 mg total) rectally 2 (two) times daily. Qty: 12 suppository, Refills: 0    KLOR-CON M10 10 MEQ tablet Take 20 mEq by mouth daily as needed (with torsemide).  Refills: 10    lidocaine-prilocaine (EMLA) cream Apply 1 application topically as needed. Qty: 30 g, Refills: 1    loratadine (CLARITIN) 10 MG tablet Take 10 mg by mouth as directed. 3 days before chemo and 4 days after    metoprolol tartrate (LOPRESSOR) 25 MG tablet TAKE 1 TABLET TWICE A DAY Qty: 60 tablet, Refills:  3    Multiple Vitamins-Minerals (ICAPS) CAPS Take 1 capsule by mouth 2 (two) times daily.     polyethylene glycol (MIRALAX / GLYCOLAX) packet Take 8.5 g by mouth daily as needed for moderate constipation.     sucralfate (CARAFATE) 1 GM/10ML suspension Take 10 mLs (1 g total) by mouth 4 (four) times daily -  with meals and at bedtime. Qty: 420 mL, Refills: 0    torsemide (DEMADEX) 20 MG tablet Take 20 mg by mouth 2 (two) times daily. Takes one tablet daily and if swelling continues he takes a second pill      STOP taking these medications     clotrimazole (LOTRIMIN) 1 % cream      folic acid (FOLVITE) 1 MG tablet      saccharomyces boulardii (FLORASTOR) 250 MG capsule        Allergies   Allergen Reactions  . Candesartan Cilexetil     REACTION: Muscle spasms  . Diltiazem Hcl     REACTION: Dizziness  . Doxazosin Mesylate Other (See Comments)    REACTION: H/A's  . Nifedipine     REACTION: Leg swelling  . Pravastatin Other (See Comments)    Muscle aches  . Red Yeast Rice     Muscle aches  . Rosuvastatin Other (See Comments)    REACTION: questionable: severe constipation  . Sertraline Other (See Comments)    oversedation  . Simvastatin Other (See Comments)    REACTION: Muscle aches  . Hydrocodone Other (See Comments)    constipation   Follow-up Information    Follow up with [name], MD In 1 week.   Specialty:  Family Medicine   Contact information:   [place] [address] [phone]        The results of significant diagnostics from this hospitalization (including imaging, microbiology, ancillary and laboratory) are listed below for reference.    Significant Diagnostic Studies: Dg Chest Port 1 View  11/28/2015  CLINICAL DATA:  Pneumonia.  Hypoxia. EXAM: PORTABLE CHEST 1 VIEW COMPARISON:  11/27/2015. FINDINGS: Interim placement of right IJ sheath. Power port catheter stable position. Heart size stable. Left mid lung and bibasilar pulmonary infiltrates are present. Low lung volumes with bibasilar atelectasis. Mild left pleural effusion. No pneumothorax. IMPRESSION: 1. Interim placement of right IJ sheath. PowerPort catheter in stable position. 2. Left mid lung field and bibasilar pulmonary infiltrates most consistent with pneumonia. Pulmonary edema cannot be excluded. Small left pleural effusion. 3. Lung volumes with bibasilar atelectasis. Electronically Signed   By: Marcello Moores  Register   On: 11/28/2015 07:07   Dg Chest Portable 1 View  11/27/2015  CLINICAL DATA:  74 year old male with shortness of breath, hypoxia and tachycardia EXAM: PORTABLE CHEST 1 VIEW COMPARISON:  Chest radiograph dated 10/17/2015 FINDINGS: Single portable view  of the chest demonstrate patchy area of ground-glass and nodular airspace opacity predominantly involving the left mid and lower lung field. There has been interval progression of the airspace opacity compared to prior study. 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 Trace left pleural effusion may present. The cardiac silhouette is within normal limits. Right pectoral Port-A-Cath again noted in stable positioning. There is degenerative changes of the osseous structures. No acute fracture. IMPRESSION: Left mid and lower lung field ground-glass and nodular airspace opacity with interval progression compared to the prior study. Findings concerning for pneumonia. Clinical correlation and follow-up recommended. Electronically Signed   By: Anner Crete M.D.   On: 11/27/2015  06:16    Microbiology: Recent Results (from the past 240 hour(s))  Blood culture (routine x 2)     Status: None   Collection Time: 11/27/15  5:50 AM  Result Value Ref Range Status   Specimen Description BLOOD LEFT ANTECUBITAL  Final   Special Requests BOTTLES DRAWN AEROBIC AND ANAEROBIC 5ML  Final   Culture   Final    NO GROWTH 5 DAYS Performed at [facility]    Report Status 12/02/2015 FINAL  Final  Urine culture     Status: None   Collection Time: 11/27/15  5:57 AM  Result Value Ref Range Status   Specimen Description URINE, CATHETERIZED  Final   Special Requests NONE  Final   Culture   Final    NO GROWTH 1 DAY Performed at [facility]    Report Status 11/28/2015 FINAL  Final  Blood culture (routine x 2)     Status: None   Collection Time: 11/27/15  9:00 AM  Result Value Ref Range Status   Specimen Description BLOOD RIGHT PORTA CATH  Final   Special Requests BOTTLES DRAWN AEROBIC AND ANAEROBIC 5ML  Final   Culture   Final    NO GROWTH 5 DAYS Performed at [facility]    Report Status 12/02/2015 FINAL  Final  MRSA PCR Screening     Status: None    Collection Time: 11/27/15  9:25 AM  Result Value Ref Range Status   MRSA by PCR NEGATIVE NEGATIVE Final    Comment:        The GeneXpert MRSA Assay (FDA approved for NASAL specimens only), is one component of a comprehensive MRSA colonization surveillance program. It is not intended to diagnose MRSA infection nor to guide or monitor treatment for MRSA infections.   Respiratory virus panel     Status: None   Collection Time: 11/27/15 11:21 AM  Result Value Ref Range Status   Source - RVPAN NOSE  Corrected   Respiratory Syncytial Virus A Negative Negative Final   Respiratory Syncytial Virus B Negative Negative Final   Influenza A Negative Negative Final   Influenza B Negative Negative Final   Parainfluenza 1 Negative Negative Final   Parainfluenza 2 Negative Negative Final   Parainfluenza 3 Negative Negative Final   Metapneumovirus Negative Negative Final   Rhinovirus Negative Negative Final   Adenovirus Negative Negative Final    Comment: (NOTE) Performed At: [facility] [place], [place] [id number] Lindon Romp MD [id number]      Labs: Basic Metabolic Panel:  Recent Labs Lab 11/28/15 0740 11/29/15 0600 11/30/15 0350 12/01/15 0403 12/02/15 0620 12/03/15 0500 12/04/15 0200  NA 137 137 138 136 140 138 139  K 5.2* 4.3 3.7 4.3 4.1 3.9 3.8  CL 106 106 108 105 108 107 108  CO2 '24 24 25 25 26 26 27  '$ GLUCOSE 195* 181* 140* 116* 131* 105* 131*  BUN 41* 44* 40* 39* 25* 16 14  CREATININE 1.10 1.27* 1.24 1.37* 1.08 0.81 0.82  CALCIUM 7.6* 7.9* 7.7* 7.7* 7.5* 7.6* 7.4*  MG 1.5* 2.2  --   --   --   --   --   PHOS 2.5  --   --   --   --   --   --    Liver Function Tests: No results for input(s): AST, ALT, ALKPHOS, BILITOT, PROT, ALBUMIN in the last 168 hours. No results for input(s): LIPASE, AMYLASE in the last 168  hours. No results for input(s): AMMONIA in the last 168 hours. CBC:  Recent Labs Lab 11/30/15 0350 12/01/15 0403  12/02/15 0620 12/03/15 0500 12/04/15 0200  WBC 5.0 11.5* 17.8* 14.7* 14.1*  NEUTROABS 4.4 10.3* 16.2* 13.4* 12.8*  HGB 10.3* 10.3* 10.5* 10.7* 10.2*  HCT 32.5* 32.5* 33.8* 34.5* 33.5*  MCV 88.8 91.3 93.6 95.8 96.0  PLT 38* 26* 31* 31* 30*   Cardiac Enzymes:  Recent Labs Lab 11/27/15 1654 11/27/15 2244  TROPONINI <0.03 <0.03   BNP: BNP (last 3 results)  Recent Labs  11/27/15 0550  BNP 128.7*    ProBNP (last 3 results)  Recent Labs  08/18/15 1245  PROBNP 222.0*    CBG:  Recent Labs Lab 11/28/15 0024 11/28/15 0612 11/28/15 0744 11/28/15 1228 11/28/15 1704  GLUCAP 203* 158* 160* 190* 186*       Signed:  Inocencia Murtaugh A  [facility] 12/04/2015, 9:49 AM

## 2015-12-04 NOTE — Progress Notes (Signed)
Attempted to call report to facility( [facility])  x 2. Line remains busy, will f/u

## 2015-12-04 NOTE — Progress Notes (Signed)
CSW continuing to follow for pt short term rehab placement.  CSW followed up with pt wife at bedside to discuss SNF placement. Pt wife hopeful from [facility]. CSW provided bed offers available at this time. [facility] had not yet responded, but CSW notified pt wife that CSW will follow up with [facility].  CSW spoke with [facility] who confirmed facility could offer pt a bed, but could not accept pt until late this afternoon.   CSW notified pt wife and pt wife is agreeable to transfer to [facility] late this afternoon.  CSW notified [facility] of pt and pt wife acceptance of bed offer. [facility] confirmed facility could accept pt and request ambulance transport not be arranged until 5:30 pm to ensure that room is ready for pt.   CSW confirmed with MD that pt medically ready for discharge today.  CSW facilitated pt discharge needs including contacting facility, faxing pt discharge information via [place], discussing with pt and pt wife at bedside, providing RN phone number to call report, and arranging ambulance transport for pt to [facility] scheduled for 5:30 pm pick up. Pt wife aware of anticipated late discharge to [facility] today and agreeable to transfer.  No further social work needs identified at this time.  CSW signing off.   Alison Murray, MSW, [place] [facility] [phone]

## 2015-12-04 NOTE — Progress Notes (Signed)
Pharmacy Antibiotic Follow-up Note  Shawn Espinoza is a 74 y.o. year-old male admitted on 11/27/2015.  The patient is currently on day #8 of vancomycin and zosyn for HAP/febrile neutropenia  Assessment/Plan:  Vancomycin '750mg'$  IV q24h - check trough in am if remains inpatient  Zosyn 3.375gm IV q8h over 4h infusion  Currently Day #8 antibiotics, consider stopping antibiotics when clinically appropriate  Temp (24hrs), Avg:97.7 F (36.5 C), Min:97.6 F (36.4 C), Max:97.8 F (36.6 C)   Recent Labs Lab 11/30/15 0350 12/01/15 0403 12/02/15 0620 12/03/15 0500 12/04/15 0200  WBC 5.0 11.5* 17.8* 14.7* 14.1*    Recent Labs Lab 11/30/15 0350 12/01/15 0403 12/02/15 0620 12/03/15 0500 12/04/15 0200  CREATININE 1.24 1.37* 1.08 0.81 0.82   Estimated Creatinine Clearance: 77.8 mL/min (by C-G formula based on Cr of 0.82).    Allergies  Allergen Reactions  . Candesartan Cilexetil     REACTION: Muscle spasms  . Diltiazem Hcl     REACTION: Dizziness  . Doxazosin Mesylate Other (See Comments)    REACTION: H/A's  . Nifedipine     REACTION: Leg swelling  . Pravastatin Other (See Comments)    Muscle aches  . Red Yeast Rice     Muscle aches  . Rosuvastatin Other (See Comments)    REACTION: questionable: severe constipation  . Sertraline Other (See Comments)    oversedation  . Simvastatin Other (See Comments)    REACTION: Muscle aches  . Hydrocodone Other (See Comments)    constipation    12/5 >> vanc >> 12/5 >> Zosyn >>   12/2 Neulasta '6mg'$  (outpt)  12/5 blood: ng-final 12/5 urine: NGF 12/5 Resp virus panel: neg 12/5 MRSA PCR: neg 12/5 urine strep: neg  Dose changes/levels: 12/6 Vt at 1900 = 23 on 1g q12 - hold, check AM 12/7 Random Vanc @ 0500 = 31 - hold, check AM 12/8 Random Vanc @ 0500 = 12 12/9 VT @ 1900= 21 on '750mg'$  q12 - change to '750mg'$  q24 Thank you for allowing pharmacy to be a part of this patient's care.  Doreene Eland, PharmD, BCPS.   Pager:  [phone] 12/04/2015 9:12 AM

## 2015-12-04 NOTE — Clinical Social Work Placement (Signed)
   CLINICAL SOCIAL WORK PLACEMENT  NOTE  Date:  12/04/2015  Patient Details  Name: Shawn Espinoza MRN: [id number] Date of Birth: 1941/09/18  Clinical Social Work is seeking post-discharge placement for this patient at the [place] level of care (*CSW will initial, date and re-position this form in  chart as items are completed):  No   Patient/family provided with [place] Work Department's list of facilities offering this level of care within the geographic area requested by the patient (or if unable, by the patient's family).  Yes   Patient/family informed of their freedom to choose among providers that offer the needed level of care, that participate in Medicare, Medicaid or managed care program needed by the patient, have an available bed and are willing to accept the patient.  Yes   Patient/family informed of [facility]'s ownership interest in [facility] and [facility], as well as of the fact that they are under no obligation to receive care at these facilities.  PASRR submitted to EDS on 12/02/15     PASRR number received on 12/02/15     Existing PASRR number confirmed on       FL2 transmitted to all facilities in geographic area requested by pt/family on 12/02/15     FL2 transmitted to all facilities within larger geographic area on       Patient informed that his/her managed care company has contracts with or will negotiate with certain facilities, including the following:        Yes   Patient/family informed of bed offers received.  Patient chooses bed at [place]     Physician recommends and patient chooses bed at      Patient to be transferred to [facility] on 12/04/15.  Patient to be transferred to facility by ambulance Corey Harold)     Patient family notified on 12/04/15 of transfer.  Name of family member notified:  pt and pt wife, Shawn Espinoza notified at bedside     PHYSICIAN Please sign  FL2     Additional Comment:    _______________________________________________ Ladell Pier, LCSW 12/04/2015, 12:27 PM

## 2015-12-04 NOTE — Care Management Important Message (Signed)
Important Message  Patient Details  Name: TYSHAWN CIULLO MRN: [id number] Date of Birth: [birth year]   Medicare Important Message Given:  Yes    Camillo Flaming 12/04/2015, 1:47 [place] Message  Patient Details  Name: DERRIK MCEACHERN MRN: [id number] Date of Birth: [birth year]   Medicare Important Message Given:  Yes    Camillo Flaming 12/04/2015, 1:47 PM

## 2015-12-04 NOTE — NC FL2 (Signed)
[place] LEVEL OF CARE SCREENING TOOL     IDENTIFICATION  Patient Name: Shawn Espinoza Birthdate: Jul 09, 1941 Sex: male Admission Date (Current Location): 11/27/2015  [facility] and [place] Number:  ([place])   Facility and Address:  [facility],  [place] [address] [place]      Provider Number: [id number]  Attending Physician Name and Address:  Verlee Monte, MD  Relative Name and Phone Number:       Current Level of Care: Hospital Recommended Level of Care: [place] Prior Approval Number:    Date Approved/Denied:   PASRR Number: [id number]  Discharge Plan: SNF    Current Diagnoses: Patient Active Problem List   Diagnosis Date Noted  . HCAP (healthcare-associated pneumonia) 11/27/2015  . Septic shock ([place]) 11/27/2015  . Hypoglycemia 10/18/2015  . Lobar pneumonia ([place]) 10/18/2015  . Aortic stenosis, moderate 10/18/2015  . Antineoplastic chemotherapy induced anemia 10/18/2015  . Sacral pressure ulcer 10/18/2015  . Hypoalbuminemia due to protein-calorie malnutrition ([place]) 10/16/2015  . Full code status 09/26/2015  . Epistaxis 08/22/2015  . Dehydration 08/22/2015  . Esophagitis 08/08/2015  . Altered mental state 08/02/2015  . Constipation 08/02/2015  . Long term current use of anticoagulant therapy 08/02/2015  . Neutropenia, drug-induced ([place]) 07/31/2015  . Myalgia 07/20/2015  . Encounter for antineoplastic chemotherapy 07/16/2015  . Bilateral leg pain 06/14/2015  . Antineoplastic chemotherapy induced pancytopenia ([place]) 05/05/2015  . Advanced care planning/counseling discussion 03/30/2015  . Thrush 02/24/2015  . Hearing loss 02/24/2015  . Atrial fibrillation, unspecified 01/23/2015  . Hypothyroid 01/23/2015  . Neoplastic malignant related fatigue 01/10/2015  . Orthostatic hypotension 01/04/2015  . Peripheral edema 12/01/2014  . Protein-calorie malnutrition ([place]) 12/01/2014  . Thrombocytopenia ([place])  11/23/2014  . 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 CHF (congestive heart failure) ([place]) 10/07/2014  . Other pancytopenia ([place]) 10/04/2014  . Skin rash 09/05/2014  . Recurrent left pleural effusion 08/13/2014  . Liver lesion 03/24/2014  . Adenocarcinoma of left lung, stage 4 ([place]) 03/21/2014  . DOE (dyspnea on exertion) 02/22/2014  . Personal history of colonic adenoma 07/06/2013  . Right knee pain 11/18/2012  . BPH (benign prostatic hypertrophy) 09/09/2012  . Polycythemia 08/19/2012  . Medicare annual wellness visit, subsequent 03/04/2012  . Positive hepatitis C antibody test   . Systolic murmur   . Lower back pain   . 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 HLD (hyperlipidemia) 06/09/2007  . History of tobacco use 06/09/2007  . Essential hypertension 06/09/2007  . Hemorrhoids 06/09/2007  . DIVERTICULOSIS, COLON 06/09/2007  . FATTY LIVER DISEASE 06/09/2007    Orientation RESPIRATION BLADDER Height & Weight    Self, Time, Situation, Place  O2 (2L) External catheter (condom cath) '5\' 9"'$  (175.3 cm) 190 lbs.  BEHAVIORAL SYMPTOMS/MOOD NEUROLOGICAL BOWEL NUTRITION STATUS     (NONE) Continent Diet (Heart Healthy)  AMBULATORY STATUS COMMUNICATION OF NEEDS Skin   Limited Assist Verbally Normal                       Personal Care Assistance Level of Assistance  Feeding Bathing Assistance: Limited assistance Feeding assistance: Independent Dressing Assistance: Limited assistance     Functional Limitations Info  Sight, Hearing, Speech Sight Info: Adequate Hearing Info: Adequate Speech Info: Adequate    SPECIAL CARE FACTORS FREQUENCY  PT (By licensed PT), OT (By licensed OT)     PT Frequency: 5 x a week OT Frequency: 5 x a week  Contractures Contractures Info: Not present    Additional Factors Info  Code Status, Allergies Code Status Info: FULL code status Allergies Info: Candesartan Cilexetil, Diltiazem Hcl, Doxazosin Mesylate,  Nifedipine, Pravastatin, Red Yeast Rice, Rosuvastatin, Sertraline, Simvastatin, Hydrocodone           Current Medications (12/04/2015):  This is the current hospital active medication list Current Facility-Administered Medications  Medication Dose Route Frequency Provider Last Rate Last Dose  . acetaminophen (TYLENOL) tablet 650 mg  650 mg Oral Q4H PRN Rigoberto Noel, MD   650 mg at 12/02/15 5003  . albuterol (PROVENTIL) (2.5 MG/3ML) 0.083% nebulizer solution 2.5 mg  2.5 mg Nebulization Q4H PRN Erick Colace, NP   2.5 mg at 11/29/15 1637  . antiseptic oral rinse (CPC / CETYLPYRIDINIUM CHLORIDE 0.05%) solution 7 mL  7 mL Mouth Rinse BID Kara Mead V, MD   7 mL at 12/03/15 2057  . guaiFENesin (MUCINEX) 12 hr tablet 1,200 mg  1,200 mg Oral BID Verlee Monte, MD   1,200 mg at 12/04/15 1040  . guaiFENesin-dextromethorphan (ROBITUSSIN DM) 100-10 MG/5ML syrup 5 mL  5 mL Oral Q4H PRN Gardiner Barefoot, NP   5 mL at 12/01/15 2340  . magic mouthwash w/lidocaine  5 mL Oral TID Verlee Monte, MD   5 mL at 11/29/15 2147  . metoprolol tartrate (LOPRESSOR) tablet 12.5 mg  12.5 mg Oral BID Mikael Spray, NP   12.5 mg at 12/04/15 1041  . ondansetron (ZOFRAN) injection 4 mg  4 mg Intravenous Q6H PRN Rigoberto Noel, MD   4 mg at 11/27/15 1453  . pantoprazole (PROTONIX) EC tablet 40 mg  40 mg Oral Daily Mikael Spray, NP   40 mg at 12/04/15 1042  . piperacillin-tazobactam (ZOSYN) IVPB 3.375 g  3.375 g Intravenous Q8H Adrian Saran, RPH   3.375 g at 12/04/15 0818  . polyethylene glycol (MIRALAX / GLYCOLAX) packet 17 g  17 g Oral Daily Verlee Monte, MD   17 g at 12/03/15 0819  . sodium chloride 0.9 % injection 10-40 mL  10-40 mL Intracatheter PRN Verlee Monte, MD   10 mL at 12/03/15 1143  . torsemide (DEMADEX) tablet 20 mg  20 mg Oral Daily Verlee Monte, MD   20 mg at 12/04/15 1042  . vancomycin (VANCOCIN) IVPB 750 mg/150 ml premix  750 mg Intravenous Q24H Adrian Saran, RPH   750 mg at 12/04/15 1040      Discharge Medications: Please see discharge summary for a list of discharge medications.  Relevant Imaging Results:  Relevant Lab Results:   Additional Information SSN: [id number]  KIDD, Hughes Better A, LCSW

## 2015-12-04 NOTE — Telephone Encounter (Signed)
Please cancel appt per wife-pt in hospital

## 2015-12-04 NOTE — Telephone Encounter (Signed)
I told Shawn Espinoza to keep his appts for CT scan and f/u. She will call if he cannot make appts.

## 2015-12-05 ENCOUNTER — Other Ambulatory Visit: Payer: Medicare Other

## 2015-12-06 ENCOUNTER — Telehealth: Payer: Self-pay | Admitting: *Deleted

## 2015-12-06 ENCOUNTER — Ambulatory Visit: Payer: Medicare Other | Admitting: Family Medicine

## 2015-12-06 NOTE — Telephone Encounter (Signed)
Returned call to pt regarding pt cancelling appts mon/tues. LMOVM for Kandra Nicolas ( wife) pt can be transported by [facility] to and from his appts. Request for pt to call to confirm these appt dates need to be cancelled.

## 2015-12-06 NOTE — Telephone Encounter (Signed)
Call from patient's wife "Perrin Smack requesting to reschedule next weeks scans and F/U.  He is in [facility] and won't be discharged until Tuesday or Wednesday..  Could these be rescheduled to the following week on Monday and Tuesday.  Call [phone]."

## 2015-12-07 ENCOUNTER — Other Ambulatory Visit: Payer: Self-pay | Admitting: Nurse Practitioner

## 2015-12-07 ENCOUNTER — Telehealth: Payer: Self-pay | Admitting: *Deleted

## 2015-12-07 NOTE — Telephone Encounter (Signed)
Call to [facility] arranged for pt to have transportation 12/19, 12/20 and 12/22. Called pt wife Kittie, discussed I have spoken with [facility] and arranged transport for next week. Kittie thanked me for the call verbalized understanding. No further concerns.

## 2015-12-07 NOTE — Telephone Encounter (Signed)
Voicemail from [employer] reporting she called yesterday and has not heard back request return call.  This nurse called reaching her at home number.  Repeated the advice of collaborative nurse documentation.  "They don't think he should keep these appointments because his counts are low."  Advised scans can still be done but she can call central scheduling to reschedule scans if counts are low and [facility] will reschedule F/u based on new scan date.  "I will go to [facility] now to see what they want to do."  Collaborative nurse notified of this call.

## 2015-12-08 DIAGNOSIS — I480 Paroxysmal atrial fibrillation: Secondary | ICD-10-CM | POA: Diagnosis not present

## 2015-12-08 DIAGNOSIS — A419 Sepsis, unspecified organism: Secondary | ICD-10-CM | POA: Diagnosis not present

## 2015-12-08 DIAGNOSIS — C3490 Malignant neoplasm of unspecified part of unspecified bronchus or lung: Secondary | ICD-10-CM | POA: Diagnosis not present

## 2015-12-08 DIAGNOSIS — N5089 Other specified disorders of the male genital organs: Secondary | ICD-10-CM | POA: Diagnosis not present

## 2015-12-08 DIAGNOSIS — E119 Type 2 diabetes mellitus without complications: Secondary | ICD-10-CM | POA: Diagnosis not present

## 2015-12-11 ENCOUNTER — Ambulatory Visit (HOSPITAL_COMMUNITY)
Admission: RE | Admit: 2015-12-11 | Discharge: 2015-12-11 | Disposition: A | Discharge status: Home / Self Care | Payer: Medicare Other | Source: Ambulatory Visit | Attending: Internal Medicine | Admitting: Internal Medicine

## 2015-12-11 ENCOUNTER — Telehealth: Payer: Self-pay | Admitting: *Deleted

## 2015-12-11 ENCOUNTER — Encounter (HOSPITAL_COMMUNITY): Payer: Self-pay

## 2015-12-11 DIAGNOSIS — R601 Generalized edema: Secondary | ICD-10-CM | POA: Insufficient documentation

## 2015-12-11 DIAGNOSIS — K769 Liver disease, unspecified: Secondary | ICD-10-CM | POA: Insufficient documentation

## 2015-12-11 DIAGNOSIS — R530 Neoplastic (malignant) related fatigue: Secondary | ICD-10-CM

## 2015-12-11 DIAGNOSIS — B1920 Unspecified viral hepatitis C without hepatic coma: Secondary | ICD-10-CM | POA: Diagnosis not present

## 2015-12-11 DIAGNOSIS — J90 Pleural effusion, not elsewhere classified: Secondary | ICD-10-CM | POA: Diagnosis not present

## 2015-12-11 DIAGNOSIS — C3492 Malignant neoplasm of unspecified part of left bronchus or lung: Secondary | ICD-10-CM | POA: Insufficient documentation

## 2015-12-11 MED ORDER — IOHEXOL 300 MG/ML  SOLN
150.0000 mL | Freq: Once | INTRAMUSCULAR | Status: AC | PRN
  Administered 2015-12-11: 100 mL via INTRAVENOUS

## 2015-12-11 MED ORDER — IOHEXOL 300 MG/ML  SOLN: 100.0000 mL | Freq: Once | INTRAMUSCULAR | Status: DC | PRN

## 2015-12-11 NOTE — Telephone Encounter (Signed)
Call report received in [place] from [place] at [facility] radiology with report of CT CAP performed today.  Per Cassandra called reading is for increased interstital thickening in lower left lobe - concern for pulmonary infection vs drug reaction.  Report read by Dr Daphane Shepherd.  This RN contacted RN at desk with Dr [name] who will print report and give to MD.

## 2015-12-12 ENCOUNTER — Ambulatory Visit: Payer: Medicare Other

## 2015-12-12 ENCOUNTER — Other Ambulatory Visit (HOSPITAL_BASED_OUTPATIENT_CLINIC_OR_DEPARTMENT_OTHER): Payer: Medicare Other

## 2015-12-12 ENCOUNTER — Telehealth: Payer: Self-pay | Admitting: Internal Medicine

## 2015-12-12 ENCOUNTER — Encounter: Payer: Self-pay | Admitting: Internal Medicine

## 2015-12-12 ENCOUNTER — Ambulatory Visit (HOSPITAL_BASED_OUTPATIENT_CLINIC_OR_DEPARTMENT_OTHER): Payer: Medicare Other | Admitting: Internal Medicine

## 2015-12-12 VITALS — BP 112/65 | HR 79 | Temp 97.8°F | Resp 18 | Ht 69.0 in | Wt 200.8 lb

## 2015-12-12 DIAGNOSIS — I4891 Unspecified atrial fibrillation: Secondary | ICD-10-CM | POA: Diagnosis not present

## 2015-12-12 DIAGNOSIS — J90 Pleural effusion, not elsewhere classified: Secondary | ICD-10-CM | POA: Diagnosis not present

## 2015-12-12 DIAGNOSIS — R0609 Other forms of dyspnea: Secondary | ICD-10-CM

## 2015-12-12 DIAGNOSIS — Z5111 Encounter for antineoplastic chemotherapy: Secondary | ICD-10-CM

## 2015-12-12 DIAGNOSIS — C3492 Malignant neoplasm of unspecified part of left bronchus or lung: Secondary | ICD-10-CM

## 2015-12-12 DIAGNOSIS — R05 Cough: Secondary | ICD-10-CM

## 2015-12-12 DIAGNOSIS — I5032 Chronic diastolic (congestive) heart failure: Secondary | ICD-10-CM

## 2015-12-12 DIAGNOSIS — J8489 Other specified interstitial pulmonary diseases: Secondary | ICD-10-CM

## 2015-12-12 DIAGNOSIS — Z7901 Long term (current) use of anticoagulants: Secondary | ICD-10-CM

## 2015-12-12 DIAGNOSIS — R530 Neoplastic (malignant) related fatigue: Secondary | ICD-10-CM

## 2015-12-12 DIAGNOSIS — C3411 Malignant neoplasm of upper lobe, right bronchus or lung: Secondary | ICD-10-CM | POA: Diagnosis not present

## 2015-12-12 DIAGNOSIS — R609 Edema, unspecified: Secondary | ICD-10-CM

## 2015-12-12 DIAGNOSIS — E46 Unspecified protein-calorie malnutrition: Secondary | ICD-10-CM

## 2015-12-12 DIAGNOSIS — D696 Thrombocytopenia, unspecified: Secondary | ICD-10-CM

## 2015-12-12 LAB — COMPREHENSIVE METABOLIC PANEL
ALBUMIN: 2.2 g/dL — AB (ref 3.5–5.0)
ALK PHOS: 80 U/L (ref 40–150)
ALT: 9 U/L (ref 0–55)
AST: 9 U/L (ref 5–34)
Anion Gap: 7 mEq/L (ref 3–11)
BILIRUBIN TOTAL: 0.49 mg/dL (ref 0.20–1.20)
BUN: 17.3 mg/dL (ref 7.0–26.0)
CALCIUM: 8.1 mg/dL — AB (ref 8.4–10.4)
CO2: 30 mEq/L — ABNORMAL HIGH (ref 22–29)
CREATININE: 0.9 mg/dL (ref 0.7–1.3)
Chloride: 104 mEq/L (ref 98–109)
EGFR: 85 mL/min/{1.73_m2} — ABNORMAL LOW (ref >90)
Glucose: 203 mg/dl — ABNORMAL HIGH (ref 70–140)
POTASSIUM: 3.8 meq/L (ref 3.5–5.1)
Sodium: 141 mEq/L (ref 136–145)
Total Protein: 4.7 g/dL — ABNORMAL LOW (ref 6.4–8.3)

## 2015-12-12 LAB — CBC WITH DIFFERENTIAL/PLATELET
BASO%: 0.1 % (ref 0.0–2.0)
BASOS ABS: 0 10*3/uL (ref 0.0–0.1)
EOS ABS: 0 10*3/uL (ref 0.0–0.5)
EOS%: 0 % (ref 0.0–7.0)
HEMATOCRIT: 30.9 % — AB (ref 38.4–49.9)
HEMOGLOBIN: 9.7 g/dL — AB (ref 13.0–17.1)
LYMPH#: 0.5 10*3/uL — AB (ref 0.9–3.3)
LYMPH%: 6.5 % — ABNORMAL LOW (ref 14.0–49.0)
MCH: 28.8 pg (ref 27.2–33.4)
MCHC: 31.4 g/dL — ABNORMAL LOW (ref 32.0–36.0)
MCV: 91.8 fL (ref 79.3–98.0)
MONO#: 0.2 10*3/uL (ref 0.1–0.9)
MONO%: 2.4 % (ref 0.0–14.0)
NEUT#: 7.3 10*3/uL — ABNORMAL HIGH (ref 1.5–6.5)
NEUT%: 91 % — ABNORMAL HIGH (ref 39.0–75.0)
Platelets: 59 10*3/uL — ABNORMAL LOW (ref 140–400)
RBC: 3.37 10*6/uL — ABNORMAL LOW (ref 4.20–5.82)
RDW: 18 % — AB (ref 11.0–14.6)
WBC: 8 10*3/uL (ref 4.0–10.3)

## 2015-12-12 NOTE — Telephone Encounter (Signed)
Gave patient avs report and appointments for March. Central will call re ct.

## 2015-12-12 NOTE — Progress Notes (Signed)
[place] [phone]   Fax:([phone]) [place], [place] [address]  DIAGNOSIS: Stage IIIB/IV non-small cell lung cancer, adenocarcinoma diagnosed in March of 2015.  PRIOR THERAPY:  1) Systemic chemotherapy with carboplatin for AUC of 5 and Alimta 500 mg/M2 every 3 weeks. First dose expected on 04/13/2014. Status post 6 cycles. 2)  Maintenance chemotherapy with single agent Alimta 500 mg/M2 every 3 weeks. First cycle on 09/06/2014. Status post 3 cycles. 3) Second course of systemic chemotherapy with carboplatin for AUC of 5 and Alimta 500 MG/M2 every 3 weeks. First cycle 11/08/2014. Status post 6 cycles. 4)  Immunotherapy with Nivolumab 3 MG/KG every 2 weeks. First dose 05/24/2015. Status post 3 cycles. 5) Systemic chemotherapy with docetaxel 60 MG/M2 and Cyramza 10 MG/KG every 3 weeks status post 6 cycles. Last dose was given 11/21/2015 with stable disease.   CURRENT THERAPY: Observation.  INTERVAL HISTORY: Shawn Espinoza 74 y.o. male returns to the clinic today for follow up visit accompanied by his wife. He completed 6 cycles systemic chemotherapy with docetaxel and Cyramza and tolerated the treatment well except for fatigue and aching pain after the Neulasta injection. He also has few episodes of pneumonia during his treatment.. He has conjunctival hemorrhage on the left eye started a week ago but it is getting better. The patient also has swelling of the lower extremity and scrotal area. He was started on Demodex by Dr. Sharlett Iles with some improvement of his edema. He denied having any significant chest pain but continues to have shortness of breath with exertion and mild cough with no hemoptysis. He has no significant fever or chills, no nausea or vomiting. He had repeat CT scan of the chest, abdomen and pelvis performed recently and he is here for evaluation and discussion of his scan  results.  MEDICAL HISTORY: Past Medical History  Diagnosis Date  . History of diabetes mellitus, type II     resolved with diet, h/o neuropathy  . Diverticulosis of colon   . Hyperlipidemia   . Hypertension   . Fatty liver 02/29/00    abd ultrasound  . Lower back pain   . Systolic murmur 3149    2Decho - normal LV fxn, EF 55%, mild AS, biatrial enlargement  . History of tobacco use quit 1990s  . 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 Personal history of colonic adenomas 07/06/2013  . Hypertensive retinopathy of both eyes 2015    mild  . Dysrhythmia     Atrial fib (found 07/2014)  . Shortness of breath   . Pneumonia   . GERD (gastroesophageal reflux disease)     occasional  . Arthritis   . Neuropathy ([facility])   . Constipation   . 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 Diabetes mellitus without complication (HCC)     Type 2  . Depression   . Non-small cell carcinoma of left lung ([facility]) 02/2014    stage IIIb/IV on chemo  . Full code status 09/26/2015  . Atrial fibrillation (HCC)     ALLERGIES:  is allergic to candesartan cilexetil; diltiazem hcl; doxazosin mesylate; nifedipine; pravastatin; red yeast rice; rosuvastatin; sertraline; simvastatin; and hydrocodone.  MEDICATIONS:  Current Outpatient Prescriptions  Medication Sig Dispense Refill  .  albuterol (PROVENTIL HFA;VENTOLIN HFA) 108 (90 BASE) MCG/ACT inhaler Inhale 2 puffs into the lungs every 6 (six) hours as needed for wheezing or shortness of breath. 1 Inhaler 3  . Alum & Mag Hydroxide-Simeth (MAGIC MOUTHWASH W/LIDOCAINE) SOLN Take 5 mLs by mouth 3 (three) times daily as needed for mouth pain. 5 mL 0  . apixaban (ELIQUIS) 5 MG TABS tablet Take 1 tablet (5 mg total) by mouth 2 (two) times daily. Restart when platelets >50.    Marland Kitchen dexamethasone (DECADRON) 4 MG tablet 2 tab po bid, the  day before, day of and day after chemotherapy every 3 weeks 40 tablet 1  . diphenoxylate-atropine (LOMOTIL) 2.5-0.025 MG per tablet Take 2 tablets by mouth 4 (four) times daily as needed for diarrhea or loose stools. 30 tablet 0  . glipiZIDE (GLUCOTROL) 5 MG tablet Take 1 tablet (5 mg total) by mouth daily with breakfast. (Patient taking differently: Take 5 mg by mouth as directed. The day before, the day of, and the day after chemo.) 10 tablet 0  . guaiFENesin (MUCINEX) 600 MG 12 hr tablet Take 600 mg by mouth 2 (two) times daily as needed for cough or to loosen phlegm.     . hydrocortisone (ANUSOL-HC) 25 MG suppository Place 1 suppository (25 mg total) rectally 2 (two) times daily. (Patient taking differently: Place 25 mg rectally 2 (two) times daily as needed for hemorrhoids. ) 12 suppository 0  . KLOR-CON M10 10 MEQ tablet Take 20 mEq by mouth daily as needed (with torsemide).   10  . levofloxacin (LEVAQUIN) 750 MG tablet Take 1 tablet (750 mg total) by mouth daily. 5 tablet 0  . lidocaine-prilocaine (EMLA) cream Apply 1 application topically as needed. 30 g 1  . loratadine (CLARITIN) 10 MG tablet Take 10 mg by mouth as directed. 3 days before chemo and 4 days after    . metoprolol tartrate (LOPRESSOR) 25 MG tablet TAKE 1 TABLET TWICE A DAY 60 tablet 3  . Multiple Vitamins-Minerals (ICAPS) CAPS Take 1 capsule by mouth 2 (two) times daily.     . polyethylene glycol (MIRALAX / GLYCOLAX) packet Take 8.5 g by mouth daily as needed for moderate constipation.     . sucralfate (CARAFATE) 1 GM/10ML suspension Take 10 mLs (1 g total) by mouth 4 (four) times daily -  with meals and at bedtime. 420 mL 0  . torsemide (DEMADEX) 20 MG tablet Take 20 mg by mouth 2 (two) times daily. Takes one tablet daily and if swelling continues he takes a second pill    . traMADol (ULTRAM) 50 MG tablet Take 1 tablet (50 mg total) by mouth every 6 (six) hours as needed. 10 tablet 0   No current facility-administered  medications for this visit.    SURGICAL HISTORY:  Past Surgical History  Procedure Laterality Date  . Colonoscopy  2004  . Colonoscopy  2014    tubular adenoma x1, mod diverticulosis ([facility])  . Flexible bronchoscopy  02/2014    WNL  . Video bronchoscopy Bilateral 03/17/2014    Procedure: VIDEO BRONCHOSCOPY WITH FLUORO;  Surgeon: Tanda Rockers, MD;  Location: [facility];  Service: Cardiopulmonary;  Laterality: Bilateral;  . Chest tube insertion Left 08/26/2014    Procedure: INSERTION OF LEFT  PLEURAL DRAINAGE CATHETER;  Surgeon: Ivin Poot, MD;  Location: [facility];  Service: Thoracic;  Laterality: Left;  . Cataract extraction Right 03/2015    Dr [name]  . Portacath placement Right 03/31/2015    Procedure:  INSERTION PORT-A-CATH;  Surgeon: Ivin Poot, MD;  Location: [facility];  Service: Thoracic;  Laterality: Right;  . Removal of pleural drainage catheter Left 03/31/2015    Procedure: REMOVAL OF PLEURAL DRAINAGE CATHETER;  Surgeon: Ivin Poot, MD;  Location: [facility];  Service: Thoracic;  Laterality: Left;    REVIEW OF SYSTEMS:  Constitutional: positive for fatigue Eyes: positive for Left eye conjunctival hemorrhage Ears, nose, mouth, throat, and face: negative Respiratory: positive for cough and dyspnea on exertion Cardiovascular: negative Gastrointestinal: negative Genitourinary:negative Integument/breast: negative Hematologic/lymphatic: Swelling of the lower extremities Musculoskeletal:positive for muscle weakness, negative for bone pain Neurological: negative Behavioral/Psych: negative Endocrine: negative Allergic/Immunologic: negative   PHYSICAL EXAMINATION: General appearance: alert, cooperative, fatigued and no distress Head: Normocephalic, without obvious abnormality, atraumatic Neck: no adenopathy, no JVD, supple, symmetrical, trachea midline and thyroid not enlarged, symmetric, no tenderness/mass/nodules Lymph nodes: Cervical, supraclavicular, and axillary nodes  normal. Resp: diminished breath sounds LLL and dullness to percussion LLL Back: symmetric, no curvature. ROM normal. No CVA tenderness. Cardio: regular rate and rhythm, S1, S2 normal, no murmur, click, rub or gallop GI: soft, non-tender; bowel sounds normal; no masses,  no organomegaly Extremities: edema 2+ Neurologic: Alert and oriented X 3, normal strength and tone. Normal symmetric reflexes. Normal coordination and gait  ECOG PERFORMANCE STATUS: 1 - Symptomatic but completely ambulatory  Blood pressure 112/65, pulse 79, temperature 97.8 F (36.6 C), temperature source Oral, resp. rate 18, height '5\' 9"'$  (1.753 m), weight 200 lb 12.8 oz (91.082 kg), SpO2 97 %.  LABORATORY DATA: Lab Results  Component Value Date   WBC 8.0 12/12/2015   HGB 9.7* 12/12/2015   HCT 30.9* 12/12/2015   MCV 91.8 12/12/2015   PLT 59* 12/12/2015      Chemistry      Component Value Date/Time   NA 139 12/04/2015 0200   NA 141 11/21/2015 1139   K 3.8 12/04/2015 0200   K 4.4 11/21/2015 1139   CL 108 12/04/2015 0200   CO2 27 12/04/2015 0200   CO2 25 11/21/2015 1139   BUN 14 12/04/2015 0200   BUN 19.1 11/21/2015 1139   CREATININE 0.82 12/04/2015 0200   CREATININE 0.9 11/21/2015 1139   CREATININE 0.83 02/22/2014 1314      Component Value Date/Time   CALCIUM 7.4* 12/04/2015 0200   CALCIUM 8.6 11/21/2015 1139   ALKPHOS 24* 11/27/2015 0550   ALKPHOS 73 11/21/2015 1139   AST 14* 11/27/2015 0550   AST 10 11/21/2015 1139   ALT 15* 11/27/2015 0550   ALT <9 11/21/2015 1139   BILITOT 0.9 11/27/2015 0550   BILITOT 0.44 11/21/2015 1139       RADIOGRAPHIC STUDIES: Ct Chest W Contrast  12/11/2015  CLINICAL DATA:  Subsequent treatment strategy for lung carcinoma. Metastatic adenopathy. Hepatitis-C. EXAM: CT CHEST, ABDOMEN, AND PELVIS WITH CONTRAST TECHNIQUE: Multidetector CT imaging of the chest, abdomen and pelvis was performed following the standard protocol during bolus administration of intravenous  contrast. CONTRAST:  145m OMNIPAQUE IOHEXOL 300 MG/ML  SOLN COMPARISON:  CT 09/22/2015 FINDINGS: CT CHEST FINDINGS Mediastinum/Nodes: No axillary or supraclavicular adenopathy. Port in the RIGHT chest wall. No mediastinal hilar lymphadenopathy. No fluid. Esophagus normal appearance pulmonary embolism. Lungs/Pleura: There is increased and small moderate pleural effusions. No measurable nodularity. Interstitial thickening in the inferior lingula and lower lobe is increased compared to prior (image 31 series 4) Musculoskeletal: No aggressive osseous lesion. CT ABDOMEN AND PELVIS FINDINGS Hepatobiliary: New rounded enhancing relative low attenuation lesion in the RIGHT  hepatic lobe measures 61 x 50 mm compared to 67 x 56 mm. No new hepatic lesions. There is a fluid along the RIGHT hepatic margin which is increased volume. Gallbladder is normal. Pancreas: Pancreas is normal. No ductal dilatation. No pancreatic inflammation. Spleen: Normal spleen Adrenals/urinary tract: Adrenal glands and kidneys are normal. The ureters and bladder normal. Stomach/Bowel: Stomach, small bowel, appendix, and cecum are normal. The colon and rectosigmoid colon are normal. Vascular/Lymphatic: Abdominal aorta is normal caliber. There is no retroperitoneal or periportal lymphadenopathy. No pelvic lymphadenopathy. Reproductive: Prostate normal. Other: Small volume free fluid the pelvis is increased slightly compared to prior. Musculoskeletal: No aggressive osseous lesion. There is an anasarca of the lower extremity slightly increased. IMPRESSION: Chest Impression: 1. Increased interstitial thickening in the LEFT lower lobe could represent PULMONARY INFECTION or drug reaction. Neoplasm less favored. 2. No clear evidence lung cancer recurrence or progression in the thorax. 3. Increase in small effusions Abdomen / Pelvis Impression: 1. large enhancing RIGHT hepatic lobe lesion is slightly contracted. 2. Slight increase in volume of free fluid the  abdomen pelvis. Anasarca noted. 3. No evidence of metastatic disease in the abdomen pelvis. These results will be called to the ordering clinician or representative by the Radiologist Assistant, and communication documented in the PACS or zVision Dashboard. Electronically Signed   By: Suzy Bouchard M.D.   On: 12/11/2015 16:10   Ct Abdomen Pelvis W Contrast  12/11/2015  CLINICAL DATA:  Subsequent treatment strategy for lung carcinoma. Metastatic adenopathy. Hepatitis-C. EXAM: CT CHEST, ABDOMEN, AND PELVIS WITH CONTRAST TECHNIQUE: Multidetector CT imaging of the chest, abdomen and pelvis was performed following the standard protocol during bolus administration of intravenous contrast. CONTRAST:  126m OMNIPAQUE IOHEXOL 300 MG/ML  SOLN COMPARISON:  CT 09/22/2015 FINDINGS: CT CHEST FINDINGS Mediastinum/Nodes: No axillary or supraclavicular adenopathy. Port in the RIGHT chest wall. No mediastinal hilar lymphadenopathy. No fluid. Esophagus normal appearance pulmonary embolism. Lungs/Pleura: There is increased and small moderate pleural effusions. No measurable nodularity. Interstitial thickening in the inferior lingula and lower lobe is increased compared to prior (image 31 series 4) Musculoskeletal: No aggressive osseous lesion. CT ABDOMEN AND PELVIS FINDINGS Hepatobiliary: New rounded enhancing relative low attenuation lesion in the RIGHT hepatic lobe measures 61 x 50 mm compared to 67 x 56 mm. No new hepatic lesions. There is a fluid along the RIGHT hepatic margin which is increased volume. Gallbladder is normal. Pancreas: Pancreas is normal. No ductal dilatation. No pancreatic inflammation. Spleen: Normal spleen Adrenals/urinary tract: Adrenal glands and kidneys are normal. The ureters and bladder normal. Stomach/Bowel: Stomach, small bowel, appendix, and cecum are normal. The colon and rectosigmoid colon are normal. Vascular/Lymphatic: Abdominal aorta is normal caliber. There is no retroperitoneal or  periportal lymphadenopathy. No pelvic lymphadenopathy. Reproductive: Prostate normal. Other: Small volume free fluid the pelvis is increased slightly compared to prior. Musculoskeletal: No aggressive osseous lesion. There is an anasarca of the lower extremity slightly increased. IMPRESSION: Chest Impression: 1. Increased interstitial thickening in the LEFT lower lobe could represent PULMONARY INFECTION or drug reaction. Neoplasm less favored. 2. No clear evidence lung cancer recurrence or progression in the thorax. 3. Increase in small effusions Abdomen / Pelvis Impression: 1. large enhancing RIGHT hepatic lobe lesion is slightly contracted. 2. Slight increase in volume of free fluid the abdomen pelvis. Anasarca noted. 3. No evidence of metastatic disease in the abdomen pelvis. These results will be called to the ordering clinician or representative by the Radiologist Assistant, and communication documented in the PACS  or zVision Dashboard. Electronically Signed   By: Suzy Bouchard M.D.   On: 12/11/2015 16:10   Dg Chest Port 1 View  11/28/2015  CLINICAL DATA:  Pneumonia.  Hypoxia. EXAM: PORTABLE CHEST 1 VIEW COMPARISON:  11/27/2015. FINDINGS: Interim placement of right IJ sheath. Power port catheter stable position. Heart size stable. Left mid lung and bibasilar pulmonary infiltrates are present. Low lung volumes with bibasilar atelectasis. Mild left pleural effusion. No pneumothorax. IMPRESSION: 1. Interim placement of right IJ sheath. PowerPort catheter in stable position. 2. Left mid lung field and bibasilar pulmonary infiltrates most consistent with pneumonia. Pulmonary edema cannot be excluded. Small left pleural effusion. 3. Lung volumes with bibasilar atelectasis. Electronically Signed   By: Marcello Moores  Register   On: 11/28/2015 07:07   Dg Chest Portable 1 View  11/27/2015  CLINICAL DATA:  74 year old male with shortness of breath, hypoxia and tachycardia EXAM: PORTABLE CHEST 1 VIEW COMPARISON:  Chest  radiograph dated 10/17/2015 FINDINGS: Single portable view of the chest demonstrate patchy area of ground-glass and nodular airspace opacity predominantly involving the left mid and lower lung field. There has been interval progression of the airspace opacity compared to prior study. 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 Trace left pleural effusion may present. The cardiac silhouette is within normal limits. Right pectoral Port-A-Cath again noted in stable positioning. There is degenerative changes of the osseous structures. No acute fracture. IMPRESSION: Left mid and lower lung field ground-glass and nodular airspace opacity with interval progression compared to the prior study. Findings concerning for pneumonia. Clinical correlation and follow-up recommended. Electronically Signed   By: Anner Crete M.D.   On: 11/27/2015 06:16   ASSESSMENT AND PLAN: this is a very pleasant 74 years old white male with:  1) Stage IV non-small cell lung cancer, adenocarcinoma presenting with large right upper lobe lung mass in addition to mediastinal and bilateral hilar lymphadenopathy as well as cervical lymphadenopathy and left pleural effusion. The patient completed a course systemic chemotherapy with carboplatin and Alimta status post 6 cycles. This was followed by 3 cycles of maintenance chemotherapy with single agent Alimta. The patient tolerated his treatment well. He had evidence for disease progression and the patient was started on treatment again with carboplatin and Alimta status post 6 cycles. He was treated with 4 cycles of immunotherapy with Nivolumab discontinued secondary to disease progression. The patient is currently on systemic chemotherapy with docetaxel and Cyramza status post 6 cycles and tolerated the treatment well except for fatigue and feels admission with recurrent pneumonia.  His recent CT scan of the chest showed no evidence for disease progression. I  discussed the scan results with the patient and his wife. I recommended for him to take a break off chemotherapy during the holiday season to enjoy his time with his family. I will see the patient back for follow-up visit in 3 months for reevaluation after repeating CT scan of the chest, abdomen and pelvis for restaging of his disease.  2) Atrial fibrillation: continue Eliquis.  3) lower extremity edema: Improved. Continue Demadex.  4) CODE STATUS: Discussed again with the patient and his wife. He would like initial resuscitation but no prolonged support Carafate) vegetative state.  He was advised to call immediately if he has any concerning symptoms in the interval.  The patient voices understanding of current disease status and treatment options and is in agreement with the current care plan.  All questions were answered. The patient knows to call the  clinic with any problems, questions or concerns. We can certainly see the patient much sooner if necessary.  Disclaimer: This note was dictated with voice recognition software. Similar sounding words can inadvertently be transcribed and may not be corrected upon review.

## 2015-12-14 ENCOUNTER — Ambulatory Visit: Payer: Medicare Other

## 2015-12-19 DIAGNOSIS — C3490 Malignant neoplasm of unspecified part of unspecified bronchus or lung: Secondary | ICD-10-CM | POA: Diagnosis not present

## 2015-12-19 DIAGNOSIS — R601 Generalized edema: Secondary | ICD-10-CM | POA: Diagnosis not present

## 2015-12-19 DIAGNOSIS — L89153 Pressure ulcer of sacral region, stage 3: Secondary | ICD-10-CM | POA: Diagnosis not present

## 2015-12-19 DIAGNOSIS — N39498 Other specified urinary incontinence: Secondary | ICD-10-CM | POA: Diagnosis not present

## 2015-12-23 DIAGNOSIS — I10 Essential (primary) hypertension: Secondary | ICD-10-CM | POA: Diagnosis not present

## 2015-12-23 DIAGNOSIS — Z87891 Personal history of nicotine dependence: Secondary | ICD-10-CM | POA: Diagnosis not present

## 2015-12-23 DIAGNOSIS — Z8701 Personal history of pneumonia (recurrent): Secondary | ICD-10-CM | POA: Diagnosis not present

## 2015-12-23 DIAGNOSIS — Z7984 Long term (current) use of oral hypoglycemic drugs: Secondary | ICD-10-CM | POA: Diagnosis not present

## 2015-12-23 DIAGNOSIS — R269 Unspecified abnormalities of gait and mobility: Secondary | ICD-10-CM | POA: Diagnosis not present

## 2015-12-23 DIAGNOSIS — I480 Paroxysmal atrial fibrillation: Secondary | ICD-10-CM | POA: Diagnosis not present

## 2015-12-23 DIAGNOSIS — C3492 Malignant neoplasm of unspecified part of left bronchus or lung: Secondary | ICD-10-CM | POA: Diagnosis not present

## 2015-12-23 DIAGNOSIS — Z7901 Long term (current) use of anticoagulants: Secondary | ICD-10-CM | POA: Diagnosis not present

## 2015-12-23 DIAGNOSIS — R2689 Other abnormalities of gait and mobility: Secondary | ICD-10-CM | POA: Diagnosis not present

## 2015-12-23 DIAGNOSIS — B182 Chronic viral hepatitis C: Secondary | ICD-10-CM | POA: Diagnosis not present

## 2015-12-23 DIAGNOSIS — D61810 Antineoplastic chemotherapy induced pancytopenia: Secondary | ICD-10-CM | POA: Diagnosis not present

## 2015-12-23 DIAGNOSIS — Z9981 Dependence on supplemental oxygen: Secondary | ICD-10-CM | POA: Diagnosis not present

## 2015-12-23 DIAGNOSIS — E119 Type 2 diabetes mellitus without complications: Secondary | ICD-10-CM | POA: Diagnosis not present

## 2015-12-23 DIAGNOSIS — L89152 Pressure ulcer of sacral region, stage 2: Secondary | ICD-10-CM | POA: Diagnosis not present

## 2015-12-23 DIAGNOSIS — M6281 Muscle weakness (generalized): Secondary | ICD-10-CM | POA: Diagnosis not present

## 2015-12-26 DIAGNOSIS — L89152 Pressure ulcer of sacral region, stage 2: Secondary | ICD-10-CM | POA: Diagnosis not present

## 2015-12-26 DIAGNOSIS — M6281 Muscle weakness (generalized): Secondary | ICD-10-CM | POA: Diagnosis not present

## 2015-12-26 DIAGNOSIS — E119 Type 2 diabetes mellitus without complications: Secondary | ICD-10-CM | POA: Diagnosis not present

## 2015-12-26 DIAGNOSIS — R269 Unspecified abnormalities of gait and mobility: Secondary | ICD-10-CM | POA: Diagnosis not present

## 2015-12-26 DIAGNOSIS — D61810 Antineoplastic chemotherapy induced pancytopenia: Secondary | ICD-10-CM | POA: Diagnosis not present

## 2015-12-26 DIAGNOSIS — C3492 Malignant neoplasm of unspecified part of left bronchus or lung: Secondary | ICD-10-CM | POA: Diagnosis not present

## 2015-12-28 ENCOUNTER — Other Ambulatory Visit: Payer: Self-pay | Admitting: Family Medicine

## 2015-12-28 ENCOUNTER — Ambulatory Visit (INDEPENDENT_AMBULATORY_CARE_PROVIDER_SITE_OTHER): Payer: Medicare Other | Admitting: Family Medicine

## 2015-12-28 ENCOUNTER — Encounter: Payer: Self-pay | Admitting: Family Medicine

## 2015-12-28 ENCOUNTER — Ambulatory Visit (INDEPENDENT_AMBULATORY_CARE_PROVIDER_SITE_OTHER)
Admission: RE | Admit: 2015-12-28 | Discharge: 2015-12-28 | Disposition: A | Discharge status: Home / Self Care | Payer: Medicare Other | Source: Ambulatory Visit | Attending: Family Medicine | Admitting: Family Medicine

## 2015-12-28 VITALS — BP 110/74 | HR 94 | Temp 97.5°F | Wt 195.5 lb

## 2015-12-28 DIAGNOSIS — T380X5A Adverse effect of glucocorticoids and synthetic analogues, initial encounter: Secondary | ICD-10-CM

## 2015-12-28 DIAGNOSIS — J189 Pneumonia, unspecified organism: Secondary | ICD-10-CM | POA: Diagnosis not present

## 2015-12-28 DIAGNOSIS — E46 Unspecified protein-calorie malnutrition: Secondary | ICD-10-CM

## 2015-12-28 DIAGNOSIS — I5032 Chronic diastolic (congestive) heart failure: Secondary | ICD-10-CM | POA: Diagnosis not present

## 2015-12-28 DIAGNOSIS — D61810 Antineoplastic chemotherapy induced pancytopenia: Secondary | ICD-10-CM | POA: Diagnosis not present

## 2015-12-28 DIAGNOSIS — R6521 Severe sepsis with septic shock: Secondary | ICD-10-CM

## 2015-12-28 DIAGNOSIS — L89152 Pressure ulcer of sacral region, stage 2: Secondary | ICD-10-CM | POA: Diagnosis not present

## 2015-12-28 DIAGNOSIS — R269 Unspecified abnormalities of gait and mobility: Secondary | ICD-10-CM | POA: Diagnosis not present

## 2015-12-28 DIAGNOSIS — A419 Sepsis, unspecified organism: Secondary | ICD-10-CM

## 2015-12-28 DIAGNOSIS — E119 Type 2 diabetes mellitus without complications: Secondary | ICD-10-CM | POA: Diagnosis not present

## 2015-12-28 DIAGNOSIS — I480 Paroxysmal atrial fibrillation: Secondary | ICD-10-CM

## 2015-12-28 DIAGNOSIS — Z789 Other specified health status: Secondary | ICD-10-CM

## 2015-12-28 DIAGNOSIS — D702 Other drug-induced agranulocytosis: Secondary | ICD-10-CM

## 2015-12-28 DIAGNOSIS — E099 Drug or chemical induced diabetes mellitus without complications: Secondary | ICD-10-CM

## 2015-12-28 DIAGNOSIS — D696 Thrombocytopenia, unspecified: Secondary | ICD-10-CM

## 2015-12-28 DIAGNOSIS — C3492 Malignant neoplasm of unspecified part of left bronchus or lung: Secondary | ICD-10-CM

## 2015-12-28 DIAGNOSIS — T451X5A Adverse effect of antineoplastic and immunosuppressive drugs, initial encounter: Secondary | ICD-10-CM

## 2015-12-28 DIAGNOSIS — R609 Edema, unspecified: Secondary | ICD-10-CM

## 2015-12-28 DIAGNOSIS — R0609 Other forms of dyspnea: Secondary | ICD-10-CM

## 2015-12-28 DIAGNOSIS — R339 Retention of urine, unspecified: Secondary | ICD-10-CM

## 2015-12-28 DIAGNOSIS — M6281 Muscle weakness (generalized): Secondary | ICD-10-CM | POA: Diagnosis not present

## 2015-12-28 LAB — POC URINALSYSI DIPSTICK (AUTOMATED)
Bilirubin, UA: NEGATIVE
Glucose, UA: NEGATIVE
Ketones, UA: NEGATIVE
Leukocytes, UA: NEGATIVE
NITRITE UA: NEGATIVE
PH UA: 7
PROTEIN UA: NEGATIVE
RBC UA: NEGATIVE
SPEC GRAV UA: 1.015
UROBILINOGEN UA: 0.2

## 2015-12-28 LAB — COMPREHENSIVE METABOLIC PANEL
ALT: 6 U/L (ref 0–53)
AST: 11 U/L (ref 0–37)
Albumin: 2.6 g/dL — ABNORMAL LOW (ref 3.5–5.2)
Alkaline Phosphatase: 65 U/L (ref 39–117)
BILIRUBIN TOTAL: 0.6 mg/dL (ref 0.2–1.2)
BUN: 14 mg/dL (ref 6–23)
CHLORIDE: 99 meq/L (ref 96–112)
CO2: 37 meq/L — AB (ref 19–32)
CREATININE: 0.95 mg/dL (ref 0.40–1.50)
Calcium: 8.5 mg/dL (ref 8.4–10.5)
GFR: 82.12 mL/min (ref >60.00)
GLUCOSE: 154 mg/dL — AB (ref 70–99)
Potassium: 4.9 mEq/L (ref 3.5–5.1)
SODIUM: 141 meq/L (ref 135–145)
Total Protein: 5.1 g/dL — ABNORMAL LOW (ref 6.0–8.3)

## 2015-12-28 LAB — CBC WITH DIFFERENTIAL/PLATELET
BASOS ABS: 0 10*3/uL (ref 0.0–0.1)
BASOS PCT: 0.5 % (ref 0.0–3.0)
EOS ABS: 0.2 10*3/uL (ref 0.0–0.7)
Eosinophils Relative: 3.4 % (ref 0.0–5.0)
HCT: 33.8 % — ABNORMAL LOW (ref 39.0–52.0)
Hemoglobin: 10.7 g/dL — ABNORMAL LOW (ref 13.0–17.0)
LYMPHS ABS: 1 10*3/uL (ref 0.7–4.0)
LYMPHS PCT: 17.8 % (ref 12.0–46.0)
MCHC: 31.6 g/dL (ref 30.0–36.0)
MCV: 94.2 fl (ref 78.0–100.0)
Monocytes Absolute: 0.5 10*3/uL (ref 0.1–1.0)
Monocytes Relative: 8.2 % (ref 3.0–12.0)
NEUTROS ABS: 4 10*3/uL (ref 1.4–7.7)
NEUTROS PCT: 70.1 % (ref 43.0–77.0)
PLATELETS: 92 10*3/uL — AB (ref 150.0–400.0)
RBC: 3.59 Mil/uL — AB (ref 4.22–5.81)
RDW: 20.4 % — ABNORMAL HIGH (ref 11.5–15.5)
WBC: 5.8 10*3/uL (ref 4.0–10.5)

## 2015-12-28 LAB — BRAIN NATRIURETIC PEPTIDE: Pro B Natriuretic peptide (BNP): 119 pg/mL — ABNORMAL HIGH (ref 0.0–100.0)

## 2015-12-28 MED ORDER — LEVOFLOXACIN 750 MG PO TABS: 750.0000 mg | ORAL_TABLET | Freq: Every day | ORAL | Status: DC

## 2015-12-28 NOTE — Assessment & Plan Note (Signed)
Completed vanc/zosyn IV then levaquin oral course. Recheck CXR - with persistent productive cough and chills may need another treatment course.

## 2015-12-28 NOTE — Assessment & Plan Note (Signed)
Large L lung mass with hepatic involvement. Appreciate onc care.

## 2015-12-28 NOTE — Assessment & Plan Note (Signed)
Recheck CBC today. 

## 2015-12-28 NOTE — Assessment & Plan Note (Signed)
Currently eliquis on hold. Check plt and if >50k restart.

## 2015-12-28 NOTE — Assessment & Plan Note (Signed)
Worsened. Check labs today and xray, update with results and treatment plan.

## 2015-12-28 NOTE — Assessment & Plan Note (Signed)
Will likely recommend glipizide only on steroid days.

## 2015-12-28 NOTE — Assessment & Plan Note (Signed)
Increased DOE with pedal edema. Check BNP and titrate torsemide dose accordingly.

## 2015-12-28 NOTE — Assessment & Plan Note (Signed)
Check alb/protein level today. Not regular with glucerna. May need to restart medpass/prostat.

## 2015-12-28 NOTE — Assessment & Plan Note (Signed)
Recheck CBC today - if plt >50k, restart eliquis.

## 2015-12-28 NOTE — Addendum Note (Signed)
Addended by: Royann Shivers A on: 12/28/2015 01:05 PM   Modules accepted: Orders

## 2015-12-28 NOTE — Assessment & Plan Note (Signed)
Now resolved.  

## 2015-12-28 NOTE — Patient Instructions (Signed)
Xray today. Labs today. We will call you with results and plan. Return in 1 month for follow up.

## 2015-12-28 NOTE — Progress Notes (Signed)
Pre visit review using our clinic review tool, if applicable. No additional management support is needed unless otherwise documented below in the visit note. 

## 2015-12-28 NOTE — Progress Notes (Signed)
BP 110/74 mmHg  Pulse 94  Temp(Src) 97.5 F (36.4 C) (Tympanic)  Wt 195 lb 8 oz (88.678 kg)  SpO2 97% on 2L [place]   CC: SNF f/u visit  Subjective:    Patient ID: Shawn Espinoza, male    DOB: [birth year], 75 y.o.   MRN: [id number]  HPI: Shawn Espinoza is a 75 y.o. male presenting on 12/28/2015 for Follow-up   Hospitalized 12/5-11/2015 with healthcare associated pneumonia with septic shock, treated with vanc/zosyn transitioned to levaquin. Shock responded to IVF without need for pressor support. Discharged to [facility] home, came home 12/30. eliquis was held due to thrombocytopenia. Hgb 10.2 on discharge. Still off eliquis.   Main concern today is pedal and scrotal swelling. Worse pain in thighs with standing. More fatigued. Currently taking torsemide 47m once daily. Persistent wet sounding cough despite mucinex. Asks about tramadol nightly which has helped him sleep and suppress cough some. Ongoing chills. Now needs 2L [place] regularly. More short winded. + chills - can't get warm. Also with incomplete emptying when voiding and increased frequency/urgency Walking with walker.  Known stage IV lung cancer dx 02/2014, on chemo docetaxel and cyramza Q3 wks + neulasta. Currently on 2 mo hiatus from chemo after recent PNA with septic shock.   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 Takes glucerna supplement.  Lab Results  Component Value Date   HGBA1C 4.9 10/18/2015   Lab Results  Component Value Date   TSH 2.034 11/27/2015     From [facility] recommended ProStat 3060mID + MedPass 4oz BID but not taking this.  Relevant past medical, surgical, family and social history reviewed and updated as indicated. Interim medical history since our last visit reviewed. Allergies and medications reviewed and updated. Current Outpatient Prescriptions on File Prior to Visit  Medication Sig  . albuterol (PROVENTIL HFA;VENTOLIN HFA) 108 (90 BASE)  MCG/ACT inhaler Inhale 2 puffs into the lungs every 6 (six) hours as needed for wheezing or shortness of breath.  . dexamethasone (DECADRON) 4 MG tablet 2 tab po bid, the day before, day of and day after chemotherapy every 3 weeks  . diphenoxylate-atropine (LOMOTIL) 2.5-0.025 MG per tablet Take 2 tablets by mouth 4 (four) times daily as needed for diarrhea or loose stools.  . gMarland KitchenaiFENesin (MUCINEX) 600 MG 12 hr tablet Take 600 mg by mouth 2 (two) times daily as needed for cough or to loosen phlegm.   . hydrocortisone (ANUSOL-HC) 25 MG suppository Place 1 suppository (25 mg total) rectally 2 (two) times daily. (Patient taking differently: Place 25 mg rectally 2 (two) times daily as needed for hemorrhoids. )  . KLOR-CON M10 10 MEQ tablet Take 20 mEq by mouth daily as needed (with torsemide).   . lMarland Kitchendocaine-prilocaine (EMLA) cream Apply 1 application topically as needed.  . loratadine (CLARITIN) 10 MG tablet Take 10 mg by mouth as directed. 3 days before chemo and 4 days after  . metoprolol tartrate (LOPRESSOR) 25 MG tablet TAKE 1 TABLET TWICE A DAY  . Multiple Vitamins-Minerals (ICAPS) CAPS Take 1 capsule by mouth 2 (two) times daily.   . polyethylene glycol (MIRALAX / GLYCOLAX) packet Take 8.5 g by mouth daily as needed for moderate constipation.   . sucralfate (CARAFATE) 1 GM/10ML suspension Take 10 mLs (1 g total) by mouth 4 (four) times daily -  with meals and at bedtime.  . torsemide (DEMADEX) 20 MG tablet Take 20 mg by mouth 2 (two) times daily.  Takes one tablet daily and if swelling continues he takes a second pill  . traMADol (ULTRAM) 50 MG tablet Take 1 tablet (50 mg total) by mouth every 6 (six) hours as needed.  . Alum & Mag Hydroxide-Simeth (MAGIC MOUTHWASH W/LIDOCAINE) SOLN Take 5 mLs by mouth 3 (three) times daily as needed for mouth pain. (Patient not taking: Reported on 12/28/2015)  . apixaban (ELIQUIS) 5 MG TABS tablet Take 1 tablet (5 mg total) by mouth 2 (two) times daily. Restart when  platelets >50. (Patient not taking: Reported on 12/28/2015)  . glipiZIDE (GLUCOTROL) 5 MG tablet Take 1 tablet (5 mg total) by mouth daily with breakfast. (Patient not taking: Reported on 12/28/2015)   No current facility-administered medications on file prior to visit.    Review of Systems Per HPI unless specifically indicated in ROS section     Objective:    BP 110/74 mmHg  Pulse 94  Temp(Src) 97.5 F (36.4 C) (Tympanic)  Wt 195 lb 8 oz (88.678 kg)  SpO2 97%  Wt Readings from Last 3 Encounters:  12/28/15 195 lb 8 oz (88.678 kg)  12/12/15 200 lb 12.8 oz (91.082 kg)  12/04/15 183 lb 10.3 oz (83.3 kg)    Physical Exam  Constitutional: He appears well-developed and well-nourished. No distress.  Ill appearing  HENT:  Mouth/Throat: Oropharynx is clear and moist. No oropharyngeal exudate.  Cardiovascular: Normal rate, regular rhythm, normal heart sounds and intact distal pulses.   No murmur heard. Pulmonary/Chest: Effort normal. No respiratory distress. He has no decreased breath sounds. He has no wheezes. He has no rhonchi. He has rales in the left middle field and the left lower field.  Musculoskeletal: He exhibits edema (woody edema lower extremities, 2+ pitting to mid abdomen).  Skin: Skin is warm and dry. No rash noted.  Psychiatric: He has a normal mood and affect.  Nursing note and vitals reviewed.  Results for orders placed or performed in visit on 12/12/15  CBC with Differential  Result Value Ref Range   WBC 8.0 4.0 - 10.3 10e3/uL   NEUT# 7.3 (H) 1.5 - 6.5 10e3/uL   HGB 9.7 (L) 13.0 - 17.1 g/dL   HCT 30.9 (L) 38.4 - 49.9 %   Platelets 59 (L) 140 - 400 10e3/uL   MCV 91.8 79.3 - 98.0 fL   MCH 28.8 27.2 - 33.4 pg   MCHC 31.4 (L) 32.0 - 36.0 g/dL   RBC 3.37 (L) 4.20 - 5.82 10e6/uL   RDW 18.0 (H) 11.0 - 14.6 %   lymph# 0.5 (L) 0.9 - 3.3 10e3/uL   MONO# 0.2 0.1 - 0.9 10e3/uL   Eosinophils Absolute 0.0 0.0 - 0.5 10e3/uL   Basophils Absolute 0.0 0.0 - 0.1 10e3/uL   NEUT%  91.0 (H) 39.0 - 75.0 %   LYMPH% 6.5 (L) 14.0 - 49.0 %   MONO% 2.4 0.0 - 14.0 %   EOS% 0.0 0.0 - 7.0 %   BASO% 0.1 0.0 - 2.0 %  Comprehensive metabolic panel  Result Value Ref Range   Sodium 141 136 - 145 mEq/L   Potassium 3.8 3.5 - 5.1 mEq/L   Chloride 104 98 - 109 mEq/L   CO2 30 (H) 22 - 29 mEq/L   Glucose 203 (H) 70 - 140 mg/dl   BUN 17.3 7.0 - 26.0 mg/dL   Creatinine 0.9 0.7 - 1.3 mg/dL   Total Bilirubin 0.49 0.20 - 1.20 mg/dL   Alkaline Phosphatase 80 40 - 150 U/L   AST 9  5 - 34 U/L   ALT 9 0 - 55 U/L   Total Protein 4.7 (L) 6.4 - 8.3 g/dL   Albumin 2.2 (L) 3.5 - 5.0 g/dL   Calcium 8.1 (L) 8.4 - 10.4 mg/dL   Anion Gap 7 3 - 11 mEq/L   EGFR 85 (L) >90 ml/min/1.73 m2      Assessment & Plan:   Problem List Items Addressed This Visit    Thrombocytopenia ([place])    Recheck CBC today - if plt >50k, restart eliquis.      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      RESOLVED: Septic shock ([place])    Now resolved.      Peripheral edema   Paroxysmal a-fib ([facility])    Currently eliquis on hold. Check plt and if >50k restart.       Relevant Medications   spironolactone (ALDACTONE) 50 MG tablet   Neutropenia, drug-induced (H[facility])   Hypoalbuminemia due to protein-calorie malnutrition ([facility])    Check alb/protein level today. Not regular with glucerna. May need to restart medpass/prostat.       HCAP (healthcare-associated pneumonia) - Primary    Completed vanc/zosyn IV then levaquin oral course. Recheck CXR - with persistent productive cough and chills may need another treatment course.      Relevant Orders   DG Chest 2 View   Full code status   DOE (dyspnea on exertion)    Worsened. Check labs today and xray, update with results and treatment plan.      CHF (congestive heart failure) (HCC)    Increased DOE with pedal edema. Check BNP and titrate torsemide dose accordingly.      Relevant Medications    spironolactone (ALDACTONE) 50 MG tablet   Other Relevant Orders   Brain natriuretic peptide   Antineoplastic chemotherapy induced pancytopenia (HCC)    Recheck CBC today.      Adenocarcinoma of left lung, stage 4 (HCC)    Large L lung mass with hepatic involvement. Appreciate onc care.       Relevant Orders   Comprehensive metabolic panel   CBC with Differential/Platelet       Follow up plan: Return in about 4 weeks (around 01/25/2016), or as needed, for follow up visit.

## 2015-12-29 ENCOUNTER — Other Ambulatory Visit: Payer: Self-pay | Admitting: *Deleted

## 2015-12-29 DIAGNOSIS — C3492 Malignant neoplasm of unspecified part of left bronchus or lung: Secondary | ICD-10-CM | POA: Diagnosis not present

## 2015-12-29 DIAGNOSIS — E119 Type 2 diabetes mellitus without complications: Secondary | ICD-10-CM | POA: Diagnosis not present

## 2015-12-29 DIAGNOSIS — M6281 Muscle weakness (generalized): Secondary | ICD-10-CM | POA: Diagnosis not present

## 2015-12-29 DIAGNOSIS — R269 Unspecified abnormalities of gait and mobility: Secondary | ICD-10-CM | POA: Diagnosis not present

## 2015-12-29 DIAGNOSIS — L89152 Pressure ulcer of sacral region, stage 2: Secondary | ICD-10-CM | POA: Diagnosis not present

## 2015-12-29 DIAGNOSIS — D61810 Antineoplastic chemotherapy induced pancytopenia: Secondary | ICD-10-CM | POA: Diagnosis not present

## 2015-12-29 MED ORDER — SPIRONOLACTONE 50 MG PO TABS: 50.0000 mg | ORAL_TABLET | Freq: Every day | ORAL | Status: DC

## 2015-12-30 ENCOUNTER — Other Ambulatory Visit: Payer: Self-pay | Admitting: Family Medicine

## 2016-01-02 DIAGNOSIS — M6281 Muscle weakness (generalized): Secondary | ICD-10-CM | POA: Diagnosis not present

## 2016-01-02 DIAGNOSIS — R269 Unspecified abnormalities of gait and mobility: Secondary | ICD-10-CM | POA: Diagnosis not present

## 2016-01-02 DIAGNOSIS — E119 Type 2 diabetes mellitus without complications: Secondary | ICD-10-CM | POA: Diagnosis not present

## 2016-01-02 DIAGNOSIS — D61810 Antineoplastic chemotherapy induced pancytopenia: Secondary | ICD-10-CM | POA: Diagnosis not present

## 2016-01-02 DIAGNOSIS — C3492 Malignant neoplasm of unspecified part of left bronchus or lung: Secondary | ICD-10-CM | POA: Diagnosis not present

## 2016-01-02 DIAGNOSIS — L89152 Pressure ulcer of sacral region, stage 2: Secondary | ICD-10-CM | POA: Diagnosis not present

## 2016-01-03 ENCOUNTER — Telehealth: Payer: Self-pay | Admitting: *Deleted

## 2016-01-03 DIAGNOSIS — L89152 Pressure ulcer of sacral region, stage 2: Secondary | ICD-10-CM | POA: Diagnosis not present

## 2016-01-03 DIAGNOSIS — E119 Type 2 diabetes mellitus without complications: Secondary | ICD-10-CM | POA: Diagnosis not present

## 2016-01-03 DIAGNOSIS — R269 Unspecified abnormalities of gait and mobility: Secondary | ICD-10-CM | POA: Diagnosis not present

## 2016-01-03 DIAGNOSIS — C3492 Malignant neoplasm of unspecified part of left bronchus or lung: Secondary | ICD-10-CM | POA: Diagnosis not present

## 2016-01-03 DIAGNOSIS — M6281 Muscle weakness (generalized): Secondary | ICD-10-CM | POA: Diagnosis not present

## 2016-01-03 DIAGNOSIS — D61810 Antineoplastic chemotherapy induced pancytopenia: Secondary | ICD-10-CM | POA: Diagnosis not present

## 2016-01-03 NOTE — Telephone Encounter (Signed)
Call from spouse Shawn Espinoza and Shawn Espinoza with [facility].   1. Patient's port-a-cath has not been used and will not be used until March 2017.  There is a pointy sharp spot you can feel poking that is a concern it may break skin and cause infection.  Should we put a Bandaid on or dress this.  Could we get him in next week to see Dr. [name] about this.   Denies fever, redness, warmth to port, drainage, open skin or any weight loss.  "Skin is white there's just a pointed protruding area of concern."  This nurse informed them port-a-cath if power port has raised area to help nurse locate it to access.  Will notify providers for flush appointment guidelines.  Port-a-cath should not be scrubbed to wash with hygiene.  If they feel comforatble covering it to cover loosely with 2x2 or 4x4 not adhesive bandaid and to call if any changes.   2. He also was [facility] [address] [facility].  He has a burning sensation in his mouth and could use MMW.    Shawn Espinoza reports MMW on hand in the home.  3. If he continues coughing with the pneumonia should he see Dr. [name] or call PCP.    Shawn Espinoza says to call spouse Shawn Espinoza with any instructions related to his pneumonia at the home number.

## 2016-01-04 ENCOUNTER — Telehealth: Payer: Self-pay | Admitting: Internal Medicine

## 2016-01-04 ENCOUNTER — Ambulatory Visit (HOSPITAL_BASED_OUTPATIENT_CLINIC_OR_DEPARTMENT_OTHER): Payer: Medicare Other

## 2016-01-04 ENCOUNTER — Other Ambulatory Visit: Payer: Self-pay | Admitting: Medical Oncology

## 2016-01-04 ENCOUNTER — Telehealth: Payer: Self-pay | Admitting: Medical Oncology

## 2016-01-04 ENCOUNTER — Telehealth: Payer: Self-pay | Admitting: Family Medicine

## 2016-01-04 VITALS — BP 113/74 | HR 89 | Temp 97.7°F | Resp 20

## 2016-01-04 DIAGNOSIS — Z452 Encounter for adjustment and management of vascular access device: Secondary | ICD-10-CM | POA: Diagnosis present

## 2016-01-04 DIAGNOSIS — M6281 Muscle weakness (generalized): Secondary | ICD-10-CM | POA: Diagnosis not present

## 2016-01-04 DIAGNOSIS — C3411 Malignant neoplasm of upper lobe, right bronchus or lung: Secondary | ICD-10-CM

## 2016-01-04 DIAGNOSIS — R269 Unspecified abnormalities of gait and mobility: Secondary | ICD-10-CM | POA: Diagnosis not present

## 2016-01-04 DIAGNOSIS — Z95828 Presence of other vascular implants and grafts: Secondary | ICD-10-CM

## 2016-01-04 DIAGNOSIS — L89152 Pressure ulcer of sacral region, stage 2: Secondary | ICD-10-CM | POA: Diagnosis not present

## 2016-01-04 DIAGNOSIS — E119 Type 2 diabetes mellitus without complications: Secondary | ICD-10-CM | POA: Diagnosis not present

## 2016-01-04 DIAGNOSIS — C3492 Malignant neoplasm of unspecified part of left bronchus or lung: Secondary | ICD-10-CM | POA: Diagnosis not present

## 2016-01-04 DIAGNOSIS — D61810 Antineoplastic chemotherapy induced pancytopenia: Secondary | ICD-10-CM | POA: Diagnosis not present

## 2016-01-04 MED ORDER — SODIUM CHLORIDE 0.9 % IJ SOLN
10.0000 mL | INTRAMUSCULAR | Status: DC | PRN
  Administered 2016-01-04: 10 mL via INTRAVENOUS
  Filled 2016-01-04: qty 10

## 2016-01-04 MED ORDER — HEPARIN SOD (PORK) LOCK FLUSH 100 UNIT/ML IV SOLN
500.0000 [IU] | Freq: Once | INTRAVENOUS | Status: AC
  Administered 2016-01-04: 500 [IU] via INTRAVENOUS
  Filled 2016-01-04: qty 5

## 2016-01-04 NOTE — Telephone Encounter (Signed)
Make sure taking each pill of mucinex with glass of water

## 2016-01-04 NOTE — Telephone Encounter (Signed)
If currently taking 1/2 capful miralax rec increase to whole capful daily, hold for diarrhea. How is weight? If no improvement in swelling continue torsemide '40mg'$  BID. If better may continue '40mg'$  once daily. Ensure no fever.

## 2016-01-04 NOTE — Telephone Encounter (Signed)
Spoke with patients wife per pof and she states that she cannot get him here today or tomorrow and request to have diane b call her,i have left a voicemail for diane to call her     Shawn Espinoza

## 2016-01-04 NOTE — Telephone Encounter (Signed)
Before I call-what should he do about the phlegm that he can't get up since he is already taking mucinex?

## 2016-01-04 NOTE — Telephone Encounter (Signed)
Need to assess port and flush today -see note from ROZ, I sent POF to scheduled flush appt today

## 2016-01-04 NOTE — Patient Instructions (Signed)

## 2016-01-04 NOTE — Telephone Encounter (Signed)
Spouse called stating pt has taken all his meds.  He still is having a hard time getting phlem up the color is still yellow/green. She wanted to know what she needs to do now  Pt is still constipation. Pt feels like he cannot eat.  The constipation is making him feel full what can she do about this  [facility] [address]  Please advise

## 2016-01-04 NOTE — Telephone Encounter (Addendum)
Pt is constipated .TakesTramadol every night for generalized pain. His skin redness has progressed from his lower legs to his upper legs is not really warm , but it scales and looks like a sunburn . 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 He does not have pain when he is sitting -he has pain when he is walking. For constipation , I told wife , Per [place], to take miralax bid. Wife requests appt with Fransisca Connors tomorrow -[id number] is best because she has an infusion tomorrow that finishes at 12 noon.

## 2016-01-04 NOTE — Telephone Encounter (Signed)
Spoke with patient's wife. She said no fever. He will increase miralax to a whole capful QD. Weight is about the same, but thighs seem to have maybe decreased some. She is going to ask him and see exactly what he thinks and go from there in regards to torsemide. She will call me on Monday with an update.

## 2016-01-05 ENCOUNTER — Ambulatory Visit (HOSPITAL_BASED_OUTPATIENT_CLINIC_OR_DEPARTMENT_OTHER): Payer: Medicare Other | Admitting: Nurse Practitioner

## 2016-01-05 ENCOUNTER — Encounter: Payer: Self-pay | Admitting: Nurse Practitioner

## 2016-01-05 VITALS — BP 100/56 | HR 81 | Temp 98.0°F | Resp 20 | Wt 183.2 lb

## 2016-01-05 DIAGNOSIS — K5900 Constipation, unspecified: Secondary | ICD-10-CM

## 2016-01-05 DIAGNOSIS — R530 Neoplastic (malignant) related fatigue: Secondary | ICD-10-CM | POA: Diagnosis not present

## 2016-01-05 DIAGNOSIS — R609 Edema, unspecified: Secondary | ICD-10-CM | POA: Diagnosis not present

## 2016-01-05 DIAGNOSIS — R269 Unspecified abnormalities of gait and mobility: Secondary | ICD-10-CM | POA: Diagnosis not present

## 2016-01-05 DIAGNOSIS — T829XXA Unspecified complication of cardiac and vascular prosthetic device, implant and graft, initial encounter: Secondary | ICD-10-CM | POA: Insufficient documentation

## 2016-01-05 DIAGNOSIS — C3492 Malignant neoplasm of unspecified part of left bronchus or lung: Secondary | ICD-10-CM | POA: Diagnosis not present

## 2016-01-05 DIAGNOSIS — E119 Type 2 diabetes mellitus without complications: Secondary | ICD-10-CM | POA: Diagnosis not present

## 2016-01-05 DIAGNOSIS — D61810 Antineoplastic chemotherapy induced pancytopenia: Secondary | ICD-10-CM | POA: Diagnosis not present

## 2016-01-05 DIAGNOSIS — Z7901 Long term (current) use of anticoagulants: Secondary | ICD-10-CM | POA: Diagnosis not present

## 2016-01-05 DIAGNOSIS — L89152 Pressure ulcer of sacral region, stage 2: Secondary | ICD-10-CM | POA: Diagnosis not present

## 2016-01-05 DIAGNOSIS — M6281 Muscle weakness (generalized): Secondary | ICD-10-CM | POA: Diagnosis not present

## 2016-01-05 NOTE — Assessment & Plan Note (Signed)
Patient has a history of chronic bilateral lower extremity edema.  He also has chronic lower extremity thickened and darkened skin below the knee.  He has been taking torsemide 40 mg up to 3 times per day for treatment of his edema.  Currently he is only taking the torsemide once per day per his primary care provider.  Patient states his edema to his lower extremities has greatly improved recently; but the skin thickness and darkening of his skin has now progressed to just above his knees.  Patient also reports some mild increased discomfort to his bilateral thighs when he stands only.  Patient also reports some continued, chronic scrotal edema as well.  Exam revealed no edema to his lower extremity; but darkened/thick skin that is very dry and scaly.  All pulses were palpable and all extremities were warm.  Patient was advised to continue elevating his legs and his scrotum as well whenever he is resting.  Patient was advised to follow back up with his primary care provider for further evaluation and management.  See photos.

## 2016-01-05 NOTE — Assessment & Plan Note (Signed)
Patient has history of atrial fib; and continues to take Eliquis  as directed.

## 2016-01-05 NOTE — Assessment & Plan Note (Signed)
Patient's wife is concerned that she can feel the edges of patient's right upper chest Port-A-Cath.  She is also concerned because there is a dried scab to the center of the Port-A-Cath hub.  Examination of patient's right upper chest Port-A-Cath today revealed the Port-A-Cath is intact with a dried./Healed scab from the most recent needle insertion site to the center of the hub.  There is no erythema, edema, tenderness, warmth, or red streaks to the site.  Of note-patient's Port-A-Cath was drawn back and flushed with no difficulty, just yesterday.  Advised patient and his wife to call/return if they have any further concerns regarding the Port-A-Cath.

## 2016-01-05 NOTE — Progress Notes (Signed)
SYMPTOM MANAGEMENT CLINIC   HPI: Shawn Espinoza 75 y.o. male diagnosed with lung cancer; with liver metastasis.  Patient is status post docetaxel/cyramza chemotherapy.  Currently undergoing observation only.   Patient last received docetaxel/cyramza chemotherapy on 11/21/2015.  He developed pneumonia and was admitted on 11/27/2015.  He was discharged on 12/04/2015; and then transferred to a skilled nursing facility for rehabilitation.  He has only been home for approximately 10 days.  Patient was seen by his primary care provider, Dr.Javier Danise Mina on 12/28/2015 for follow-up post discharge.  Patient continued to complain at that time of chronic congested cough, shortness of breath, and fatigue.  Chest x-ray obtained at that time revealed a continued residual pneumonia.  Patient was given a second round of Levaquin antibiotics for treatment of residual pneumonia.  Patient presents to the [facility] today with multiple complaints.  See further notes for details.  Patient is scheduled for labs only on 03/07/2016.  Patient is scheduled for follow-up visit on 03/13/2016.  HPI  ROS  Past Medical History  Diagnosis Date  . History of diabetes mellitus, type II     resolved with diet, h/o neuropathy  . Diverticulosis of colon   . Hyperlipidemia   . Hypertension   . Fatty liver 02/29/00    abd ultrasound  . Lower back pain   . Systolic murmur 7341    2Decho - normal LV fxn, EF 55%, mild AS, biatrial enlargement  . History of tobacco use quit 1990s  . 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 Personal history of colonic adenomas 07/06/2013  . Hypertensive retinopathy of both eyes 2015    mild  . Dysrhythmia     Atrial fib (found 07/2014)  . Shortness of breath   . Pneumonia   . GERD (gastroesophageal reflux disease)     occasional  . Arthritis   . Neuropathy  ([place])   . Constipation   . 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 Diabetes mellitus without complication (HCC)     Type 2  . Depression   . 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 Full code status 09/26/2015  . Atrial fibrillation ([place])   . Lobar pneumonia ([place]) 10/18/2015    Past Surgical History  Procedure Laterality Date  . Colonoscopy  2004  . Colonoscopy  2014    tubular adenoma x1, mod diverticulosis ([place])  . Flexible bronchoscopy  02/2014    WNL  . Video bronchoscopy Bilateral 03/17/2014    Procedure: VIDEO BRONCHOSCOPY WITH FLUORO;  Surgeon: Tanda Rockers, MD;  Location: [facility];  Service: Cardiopulmonary;  Laterality: Bilateral;  . Chest tube insertion Left 08/26/2014    Procedure: INSERTION OF LEFT  PLEURAL DRAINAGE CATHETER;  Surgeon: Ivin Poot, MD;  Location: [facility];  Service: Thoracic;  Laterality: Left;  . Cataract extraction Right 03/2015    Dr [name]  . Portacath placement Right 03/31/2015    Procedure: INSERTION PORT-A-CATH;  Surgeon: Ivin Poot, MD;  Location: [facility];  Service: Thoracic;  Laterality: Right;  . 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[place]); HLD (hyperlipidemia); History of tobacco use; Essential hypertension; Hemorrhoids; DIVERTICULOSIS, COLON; FATTY LIVER DISEASE; Medicare annual wellness visit, subsequent; Positive hepatitis C antibody test; Systolic murmur; Lower back pain; Polycythemia; BPH (benign prostatic hypertrophy); Right knee pain; Personal history of colonic adenoma; DOE (dyspnea on exertion); Adenocarcinoma of left lung, stage 4 ([place]); Liver lesion; Recurrent left pleural effusion; Skin rash; 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[place]); CHF (congestive heart failure) ([place]);  Thrombocytopenia ([employer]); Peripheral edema; Orthostatic hypotension; Neoplastic malignant related fatigue; Paroxysmal a-fib ([place]); Hypothyroid; Thrush; Hearing loss; Advanced care planning/counseling discussion; Antineoplastic chemotherapy induced pancytopenia ([place]); Bilateral leg pain; Encounter for antineoplastic chemotherapy; Myalgia; Neutropenia, drug-induced ([place]); Altered mental state; Constipation; Long term current use of anticoagulant therapy; Esophagitis; Epistaxis; Dehydration; Full code status; Hypoalbuminemia due to protein-calorie malnutrition ([place]); Hypoglycemia; Aortic stenosis, moderate; Antineoplastic chemotherapy induced anemia; Sacral pressure ulcer; HCAP (healthcare-associated pneumonia); and Central line complication on his problem list.    is allergic to candesartan cilexetil; diltiazem hcl; doxazosin mesylate; nifedipine; pravastatin; red yeast rice; rosuvastatin; sertraline; simvastatin; and hydrocodone.    Medication List       This list is accurate as of: 01/05/16  9:03 PM.  Always use your most recent med list.               albuterol 108 (90 Base) MCG/ACT inhaler  Commonly known as:  PROVENTIL HFA;VENTOLIN HFA  Inhale 2 puffs into the lungs every 6 (six) hours as needed for wheezing or shortness of breath.     apixaban 5 MG Tabs tablet  Commonly known as:  ELIQUIS  Take 1 tablet (5 mg total) by mouth 2 (two) times daily. Restart when platelets >50.     dexamethasone 4 MG tablet  Commonly known as:  DECADRON  2 tab po bid, the day before, day of and day after chemotherapy every 3 weeks     diphenoxylate-atropine 2.5-0.025 MG tablet  Commonly known as:  LOMOTIL  Take 2 tablets by mouth 4 (four) times daily as needed for diarrhea or loose stools.     GLUCOTROL 5 MG tablet  Generic drug:  glipiZIDE  Take 1 tablet (5 mg total) by mouth as directed. Take one daily when taking steroid (dexamethasone).     guaiFENesin 600 MG 12 hr tablet  Commonly known as:   MUCINEX  Take 600 mg by mouth 2 (two) times daily as needed for cough or to loosen phlegm.     hydrocortisone 25 MG suppository  Commonly known as:  ANUSOL-HC  Place 1 suppository (25 mg total) rectally 2 (two) times daily.     ICAPS Caps  Take 1 capsule by mouth 2 (two) times daily.     KLOR-CON M10 10 MEQ tablet  Generic drug:  potassium chloride  Take 20 mEq by mouth daily as needed (with torsemide).     lidocaine-prilocaine cream  Commonly known as:  EMLA  Apply 1 application topically as needed.     loratadine 10 MG tablet  Commonly known as:  CLARITIN  Take 10 mg by mouth as directed. Reported on 01/05/2016     magic mouthwash w/lidocaine Soln  Take 5 mLs by mouth 3 (three) times daily as needed for mouth pain.     metoprolol tartrate 25 MG tablet  Commonly known as:  LOPRESSOR  TAKE 1 TABLET TWICE A DAY     polyethylene glycol packet  Commonly known as:  MIRALAX / GLYCOLAX  Take 8.5 g by mouth daily as needed for moderate constipation.  spironolactone 50 MG tablet  Commonly known as:  ALDACTONE  Take 1 tablet (50 mg total) by mouth daily.     sucralfate 1 GM/10ML suspension  Commonly known as:  CARAFATE  Take 10 mLs (1 g total) by mouth 4 (four) times daily -  with meals and at bedtime.     torsemide 20 MG tablet  Commonly known as:  DEMADEX  Take 20 mg by mouth 2 (two) times daily. Takes one tablet daily and if swelling continues he takes a second pill     traMADol 50 MG tablet  Commonly known as:  ULTRAM  Take 1 tablet (50 mg total) by mouth every 6 (six) hours as needed.         PHYSICAL EXAMINATION  Oncology Vitals 01/05/2016 01/04/2016  Height - -  Weight 83.099 kg -  Weight (lbs) 183 lbs 3 oz -  BMI (kg/m2) - -  Temp 98 97.7  Pulse 81 89  Resp 20 20  SpO2 94 95  BSA (m2) - -   BP Readings from Last 2 Encounters:  01/05/16 100/56  01/04/16 113/74    Physical Exam  Constitutional: He is oriented to person, place, and time. Vital signs  are normal. He appears malnourished. He appears unhealthy. He appears cachectic.  Patient appears fatigued, weak, pale, and chronically ill.  HENT:  Head: Normocephalic and atraumatic.  Eyes: Conjunctivae and EOM are normal. Pupils are equal, round, and reactive to light. Right eye exhibits no discharge. Left eye exhibits no discharge. No scleral icterus.  Neck: Normal range of motion.  Pulmonary/Chest: Effort normal. No respiratory distress.  On home O2 at 2 L chronically.  Abdominal: Soft.  Musculoskeletal: Normal range of motion. He exhibits no edema or tenderness.  Neurological: He is alert and oriented to person, place, and time.  Skin: Skin is warm and dry. No rash noted. No erythema. There is pallor.  Exam revealed no edema to his lower extremity; but darkened/thick skin that is very dry and scaly.  All pulses were palpable and all extremities were warm.   Psychiatric: Affect normal.  Nursing note and vitals reviewed.   LABORATORY DATA:. No visits with results within 3 Day(s) from this visit. Latest known visit with results is:  Office Visit on 12/28/2015  Component Date Value Ref Range Status  . Sodium 12/28/2015 141  135 - 145 mEq/L Final  . Potassium 12/28/2015 4.9  3.5 - 5.1 mEq/L Final  . Chloride 12/28/2015 99  96 - 112 mEq/L Final  . CO2 12/28/2015 37* 19 - 32 mEq/L Final  . Glucose, Bld 12/28/2015 154* 70 - 99 mg/dL Final  . BUN 12/28/2015 14  6 - 23 mg/dL Final  . Creatinine, Ser 12/28/2015 0.95  0.40 - 1.50 mg/dL Final  . Total Bilirubin 12/28/2015 0.6  0.2 - 1.2 mg/dL Final  . Alkaline Phosphatase 12/28/2015 65  39 - 117 U/L Final  . AST 12/28/2015 11  0 - 37 U/L Final  . ALT 12/28/2015 6  0 - 53 U/L Final  . Total Protein 12/28/2015 5.1* 6.0 - 8.3 g/dL Final  . Albumin 12/28/2015 2.6* 3.5 - 5.2 g/dL Final  . Calcium 12/28/2015 8.5  8.4 - 10.5 mg/dL Final  . GFR 12/28/2015 82.12  >60.00 mL/min Final  . WBC 12/28/2015 5.8  4.0 - 10.5 K/uL Final  . RBC  12/28/2015 3.59* 4.22 - 5.81 Mil/uL Final  . Hemoglobin 12/28/2015 10.7* 13.0 - 17.0 g/dL Final  . HCT 12/28/2015 33.8*  39.0 - 52.0 % Final  . MCV 12/28/2015 94.2  78.0 - 100.0 fl Final  . MCHC 12/28/2015 31.6  30.0 - 36.0 g/dL Final  . RDW 12/28/2015 20.4* 11.5 - 15.5 % Final  . Platelets 12/28/2015 92.0* 150.0 - 400.0 K/uL Final  . Neutrophils Relative % 12/28/2015 70.1  43.0 - 77.0 % Final  . Lymphocytes Relative 12/28/2015 17.8  12.0 - 46.0 % Final  . Monocytes Relative 12/28/2015 8.2  3.0 - 12.0 % Final  . Eosinophils Relative 12/28/2015 3.4  0.0 - 5.0 % Final  . Basophils Relative 12/28/2015 0.5  0.0 - 3.0 % Final  . Neutro Abs 12/28/2015 4.0  1.4 - 7.7 K/uL Final  . Lymphs Abs 12/28/2015 1.0  0.7 - 4.0 K/uL Final  . Monocytes Absolute 12/28/2015 0.5  0.1 - 1.0 K/uL Final  . Eosinophils Absolute 12/28/2015 0.2  0.0 - 0.7 K/uL Final  . Basophils Absolute 12/28/2015 0.0  0.0 - 0.1 K/uL Final  . Pro B Natriuretic peptide (BNP) 12/28/2015 119.0* 0.0 - 100.0 pg/mL Final  . Color, UA 12/28/2015 Yellow   Final  . Clarity, UA 12/28/2015 Clear   Final  . Glucose, UA 12/28/2015 Negative   Final  . Bilirubin, UA 12/28/2015 Negative   Final  . Ketones, UA 12/28/2015 Negative   Final  . Spec Grav, UA 12/28/2015 1.015   Final  . Blood, UA 12/28/2015 Negative   Final  . pH, UA 12/28/2015 7.0   Final  . Protein, UA 12/28/2015 Negative   Final  . Urobilinogen, UA 12/28/2015 0.2   Final  . Nitrite, UA 12/28/2015 Negative   Final  . Leukocytes, UA 12/28/2015 Negative  Negative Final   Bilat legs:     Close up legs:     RADIOGRAPHIC STUDIES: No results found.  ASSESSMENT/PLAN:    Adenocarcinoma of left lung, stage 4 ([place]) Patient last received docetaxel/cyramza chemotherapy on 11/21/2015.  He developed pneumonia and was admitted on 11/27/2015.  He was discharged on 12/04/2015; and then transferred to a skilled nursing facility for rehabilitation.  He has only been home for approximately  10 days.  Patient was seen by his primary care provider, Dr.Javier Danise Mina on 12/28/2015 for follow-up post discharge.  Patient continued to complain at that time of chronic congested cough, shortness of breath, and fatigue.  Chest x-ray obtained at that time revealed a continued residual pneumonia.  Patient was given a second round of Levaquin antibiotics for treatment of residual pneumonia.  Patient presents to the [facility] today with multiple complaints.  See further notes for details.  Patient is scheduled for labs only on 03/07/2016.  Patient is scheduled for follow-up visit on 03/13/2016.  Central line complication Patient's wife is concerned that she can feel the edges of patient's right upper chest Port-A-Cath.  She is also concerned because there is a dried scab to the center of the Port-A-Cath hub.  Examination of patient's right upper chest Port-A-Cath today revealed the Port-A-Cath is intact with a dried./Healed scab from the most recent needle insertion site to the center of the hub.  There is no erythema, edema, tenderness, warmth, or red streaks to the site.  Of note-patient's Port-A-Cath was drawn back and flushed with no difficulty, just yesterday.  Advised patient and his wife to call/return if they have any further concerns regarding the Port-A-Cath.  Constipation Patient has a history of chronic constipation.  He last had a regular bowel movement approximately 3 days ago.  He has  noted that he has been eating much less recently as well.  Patient only occasionally takes stool softener.  Advised patient to take stool softener twice daily; and also to try Miralax and/or mag citrate to relieve constipation issues.  Long term current use of anticoagulant therapy Patient has history of atrial fib; and continues to take Eliquis  as directed.  Neoplastic malignant related fatigue Patient reports continued/progressive fatigue and generalized weakness since his original  pneumonia diagnosis.  He has been undergoing both physical therapy and occupational therapy in his home.  Since discharge from the skilled nursing facility.  He is ambulating with the assistance of a walker in the home.  Patient was encouraged to remain as active as possible.  Peripheral edema Patient has a history of chronic bilateral lower extremity edema.  He also has chronic lower extremity thickened and darkened skin below the knee.  He has been taking torsemide 40 mg up to 3 times per day for treatment of his edema.  Currently he is only taking the torsemide once per day per his primary care provider.  Patient states his edema to his lower extremities has greatly improved recently; but the skin thickness and darkening of his skin has now progressed to just above his knees.  Patient also reports some mild increased discomfort to his bilateral thighs when he stands only.  Patient also reports some continued, chronic scrotal edema as well.  Exam revealed no edema to his lower extremity; but darkened/thick skin that is very dry and scaly.  All pulses were palpable and all extremities were warm.  Patient was advised to continue elevating his legs and his scrotum as well whenever he is resting.  Patient was advised to follow back up with his primary care provider for further evaluation and management.  See photos.   Patient stated understanding of all instructions; and was in agreement with this plan of care. The patient knows to call the clinic with any problems, questions or concerns.   Review/collaboration with Dr. [name] regarding all aspects of patient's visit today.   Total time spent with patient was 25 minutes;  with greater than 75 percent of that time spent in face to face counseling regarding patient's symptoms,  and coordination of care and follow up.  Disclaimer:This dictation was prepared with Dragon/digital dictation along with [employer]. Any transcriptional  errors that result from this process are unintentional.  Drue Second, NP 01/05/2016

## 2016-01-05 NOTE — Assessment & Plan Note (Signed)
Patient last received docetaxel/cyramza chemotherapy on 11/21/2015.  He developed pneumonia and was admitted on 11/27/2015.  He was discharged on 12/04/2015; and then transferred to a skilled nursing facility for rehabilitation.  He has only been home for approximately 10 days.  Patient was seen by his primary care provider, Dr.Javier Danise Mina on 12/28/2015 for follow-up post discharge.  Patient continued to complain at that time of chronic congested cough, shortness of breath, and fatigue.  Chest x-ray obtained at that time revealed a continued residual pneumonia.  Patient was given a second round of Levaquin antibiotics for treatment of residual pneumonia.  Patient presents to the [facility] today with multiple complaints.  See further notes for details.  Patient is scheduled for labs only on 03/07/2016.  Patient is scheduled for follow-up visit on 03/13/2016.

## 2016-01-05 NOTE — Assessment & Plan Note (Signed)
Patient has a history of chronic constipation.  He last had a regular bowel movement approximately 3 days ago.  He has noted that he has been eating much less recently as well.  Patient only occasionally takes stool softener.  Advised patient to take stool softener twice daily; and also to try Miralax and/or mag citrate to relieve constipation issues.

## 2016-01-05 NOTE — Assessment & Plan Note (Signed)
Patient reports continued/progressive fatigue and generalized weakness since his original pneumonia diagnosis.  He has been undergoing both physical therapy and occupational therapy in his home.  Since discharge from the skilled nursing facility.  He is ambulating with the assistance of a walker in the home.  Patient was encouraged to remain as active as possible.

## 2016-01-06 DIAGNOSIS — M6281 Muscle weakness (generalized): Secondary | ICD-10-CM | POA: Diagnosis not present

## 2016-01-06 DIAGNOSIS — R269 Unspecified abnormalities of gait and mobility: Secondary | ICD-10-CM | POA: Diagnosis not present

## 2016-01-06 DIAGNOSIS — D61810 Antineoplastic chemotherapy induced pancytopenia: Secondary | ICD-10-CM | POA: Diagnosis not present

## 2016-01-06 DIAGNOSIS — L89152 Pressure ulcer of sacral region, stage 2: Secondary | ICD-10-CM | POA: Diagnosis not present

## 2016-01-06 DIAGNOSIS — C3492 Malignant neoplasm of unspecified part of left bronchus or lung: Secondary | ICD-10-CM | POA: Diagnosis not present

## 2016-01-06 DIAGNOSIS — E119 Type 2 diabetes mellitus without complications: Secondary | ICD-10-CM | POA: Diagnosis not present

## 2016-01-09 DIAGNOSIS — L89152 Pressure ulcer of sacral region, stage 2: Secondary | ICD-10-CM | POA: Diagnosis not present

## 2016-01-09 DIAGNOSIS — E119 Type 2 diabetes mellitus without complications: Secondary | ICD-10-CM | POA: Diagnosis not present

## 2016-01-09 DIAGNOSIS — C3492 Malignant neoplasm of unspecified part of left bronchus or lung: Secondary | ICD-10-CM | POA: Diagnosis not present

## 2016-01-09 DIAGNOSIS — R269 Unspecified abnormalities of gait and mobility: Secondary | ICD-10-CM | POA: Diagnosis not present

## 2016-01-09 DIAGNOSIS — M6281 Muscle weakness (generalized): Secondary | ICD-10-CM | POA: Diagnosis not present

## 2016-01-09 DIAGNOSIS — D61810 Antineoplastic chemotherapy induced pancytopenia: Secondary | ICD-10-CM | POA: Diagnosis not present

## 2016-01-10 ENCOUNTER — Ambulatory Visit (INDEPENDENT_AMBULATORY_CARE_PROVIDER_SITE_OTHER): Payer: Medicare Other | Admitting: Ophthalmology

## 2016-01-11 DIAGNOSIS — R269 Unspecified abnormalities of gait and mobility: Secondary | ICD-10-CM | POA: Diagnosis not present

## 2016-01-11 DIAGNOSIS — L89152 Pressure ulcer of sacral region, stage 2: Secondary | ICD-10-CM | POA: Diagnosis not present

## 2016-01-11 DIAGNOSIS — M6281 Muscle weakness (generalized): Secondary | ICD-10-CM | POA: Diagnosis not present

## 2016-01-11 DIAGNOSIS — C3492 Malignant neoplasm of unspecified part of left bronchus or lung: Secondary | ICD-10-CM | POA: Diagnosis not present

## 2016-01-11 DIAGNOSIS — E119 Type 2 diabetes mellitus without complications: Secondary | ICD-10-CM | POA: Diagnosis not present

## 2016-01-11 DIAGNOSIS — D61810 Antineoplastic chemotherapy induced pancytopenia: Secondary | ICD-10-CM | POA: Diagnosis not present

## 2016-01-12 DIAGNOSIS — M6281 Muscle weakness (generalized): Secondary | ICD-10-CM | POA: Diagnosis not present

## 2016-01-12 DIAGNOSIS — E119 Type 2 diabetes mellitus without complications: Secondary | ICD-10-CM | POA: Diagnosis not present

## 2016-01-12 DIAGNOSIS — C3492 Malignant neoplasm of unspecified part of left bronchus or lung: Secondary | ICD-10-CM | POA: Diagnosis not present

## 2016-01-12 DIAGNOSIS — D61810 Antineoplastic chemotherapy induced pancytopenia: Secondary | ICD-10-CM | POA: Diagnosis not present

## 2016-01-12 DIAGNOSIS — R269 Unspecified abnormalities of gait and mobility: Secondary | ICD-10-CM | POA: Diagnosis not present

## 2016-01-12 DIAGNOSIS — L89152 Pressure ulcer of sacral region, stage 2: Secondary | ICD-10-CM | POA: Diagnosis not present

## 2016-01-13 DIAGNOSIS — D61810 Antineoplastic chemotherapy induced pancytopenia: Secondary | ICD-10-CM | POA: Diagnosis not present

## 2016-01-13 DIAGNOSIS — R269 Unspecified abnormalities of gait and mobility: Secondary | ICD-10-CM | POA: Diagnosis not present

## 2016-01-13 DIAGNOSIS — L89152 Pressure ulcer of sacral region, stage 2: Secondary | ICD-10-CM | POA: Diagnosis not present

## 2016-01-13 DIAGNOSIS — C3492 Malignant neoplasm of unspecified part of left bronchus or lung: Secondary | ICD-10-CM | POA: Diagnosis not present

## 2016-01-13 DIAGNOSIS — M6281 Muscle weakness (generalized): Secondary | ICD-10-CM | POA: Diagnosis not present

## 2016-01-13 DIAGNOSIS — E119 Type 2 diabetes mellitus without complications: Secondary | ICD-10-CM | POA: Diagnosis not present

## 2016-01-15 DIAGNOSIS — M6281 Muscle weakness (generalized): Secondary | ICD-10-CM | POA: Diagnosis not present

## 2016-01-15 DIAGNOSIS — C3492 Malignant neoplasm of unspecified part of left bronchus or lung: Secondary | ICD-10-CM | POA: Diagnosis not present

## 2016-01-15 DIAGNOSIS — R269 Unspecified abnormalities of gait and mobility: Secondary | ICD-10-CM | POA: Diagnosis not present

## 2016-01-15 DIAGNOSIS — L89152 Pressure ulcer of sacral region, stage 2: Secondary | ICD-10-CM | POA: Diagnosis not present

## 2016-01-15 DIAGNOSIS — E119 Type 2 diabetes mellitus without complications: Secondary | ICD-10-CM | POA: Diagnosis not present

## 2016-01-15 DIAGNOSIS — D61810 Antineoplastic chemotherapy induced pancytopenia: Secondary | ICD-10-CM | POA: Diagnosis not present

## 2016-01-16 DIAGNOSIS — L89152 Pressure ulcer of sacral region, stage 2: Secondary | ICD-10-CM | POA: Diagnosis not present

## 2016-01-16 DIAGNOSIS — M6281 Muscle weakness (generalized): Secondary | ICD-10-CM | POA: Diagnosis not present

## 2016-01-16 DIAGNOSIS — R269 Unspecified abnormalities of gait and mobility: Secondary | ICD-10-CM | POA: Diagnosis not present

## 2016-01-16 DIAGNOSIS — D61810 Antineoplastic chemotherapy induced pancytopenia: Secondary | ICD-10-CM | POA: Diagnosis not present

## 2016-01-16 DIAGNOSIS — C3492 Malignant neoplasm of unspecified part of left bronchus or lung: Secondary | ICD-10-CM | POA: Diagnosis not present

## 2016-01-16 DIAGNOSIS — E119 Type 2 diabetes mellitus without complications: Secondary | ICD-10-CM | POA: Diagnosis not present

## 2016-01-17 DIAGNOSIS — D61810 Antineoplastic chemotherapy induced pancytopenia: Secondary | ICD-10-CM | POA: Diagnosis not present

## 2016-01-17 DIAGNOSIS — R269 Unspecified abnormalities of gait and mobility: Secondary | ICD-10-CM | POA: Diagnosis not present

## 2016-01-17 DIAGNOSIS — M6281 Muscle weakness (generalized): Secondary | ICD-10-CM | POA: Diagnosis not present

## 2016-01-17 DIAGNOSIS — L89152 Pressure ulcer of sacral region, stage 2: Secondary | ICD-10-CM | POA: Diagnosis not present

## 2016-01-17 DIAGNOSIS — E119 Type 2 diabetes mellitus without complications: Secondary | ICD-10-CM | POA: Diagnosis not present

## 2016-01-17 DIAGNOSIS — C3492 Malignant neoplasm of unspecified part of left bronchus or lung: Secondary | ICD-10-CM | POA: Diagnosis not present

## 2016-01-18 DIAGNOSIS — C3492 Malignant neoplasm of unspecified part of left bronchus or lung: Secondary | ICD-10-CM | POA: Diagnosis not present

## 2016-01-18 DIAGNOSIS — D61810 Antineoplastic chemotherapy induced pancytopenia: Secondary | ICD-10-CM | POA: Diagnosis not present

## 2016-01-18 DIAGNOSIS — E119 Type 2 diabetes mellitus without complications: Secondary | ICD-10-CM | POA: Diagnosis not present

## 2016-01-18 DIAGNOSIS — R269 Unspecified abnormalities of gait and mobility: Secondary | ICD-10-CM | POA: Diagnosis not present

## 2016-01-18 DIAGNOSIS — M6281 Muscle weakness (generalized): Secondary | ICD-10-CM | POA: Diagnosis not present

## 2016-01-18 DIAGNOSIS — L89152 Pressure ulcer of sacral region, stage 2: Secondary | ICD-10-CM | POA: Diagnosis not present

## 2016-01-19 ENCOUNTER — Telehealth: Payer: Self-pay

## 2016-01-19 NOTE — Telephone Encounter (Signed)
Pharmacy notified.

## 2016-01-19 NOTE — Telephone Encounter (Signed)
Shawn Espinoza with [employer] left v/m that torsemide was approved from 01/18/2016 to 01/17/2017. Letter of approval to follow.

## 2016-01-20 DIAGNOSIS — C3492 Malignant neoplasm of unspecified part of left bronchus or lung: Secondary | ICD-10-CM | POA: Diagnosis not present

## 2016-01-20 DIAGNOSIS — E119 Type 2 diabetes mellitus without complications: Secondary | ICD-10-CM | POA: Diagnosis not present

## 2016-01-20 DIAGNOSIS — M6281 Muscle weakness (generalized): Secondary | ICD-10-CM | POA: Diagnosis not present

## 2016-01-20 DIAGNOSIS — R269 Unspecified abnormalities of gait and mobility: Secondary | ICD-10-CM | POA: Diagnosis not present

## 2016-01-20 DIAGNOSIS — D61810 Antineoplastic chemotherapy induced pancytopenia: Secondary | ICD-10-CM | POA: Diagnosis not present

## 2016-01-20 DIAGNOSIS — L89152 Pressure ulcer of sacral region, stage 2: Secondary | ICD-10-CM | POA: Diagnosis not present

## 2016-01-22 DIAGNOSIS — E119 Type 2 diabetes mellitus without complications: Secondary | ICD-10-CM | POA: Diagnosis not present

## 2016-01-22 DIAGNOSIS — C3492 Malignant neoplasm of unspecified part of left bronchus or lung: Secondary | ICD-10-CM | POA: Diagnosis not present

## 2016-01-22 DIAGNOSIS — L89152 Pressure ulcer of sacral region, stage 2: Secondary | ICD-10-CM | POA: Diagnosis not present

## 2016-01-22 DIAGNOSIS — M6281 Muscle weakness (generalized): Secondary | ICD-10-CM | POA: Diagnosis not present

## 2016-01-22 DIAGNOSIS — R269 Unspecified abnormalities of gait and mobility: Secondary | ICD-10-CM | POA: Diagnosis not present

## 2016-01-22 DIAGNOSIS — D61810 Antineoplastic chemotherapy induced pancytopenia: Secondary | ICD-10-CM | POA: Diagnosis not present

## 2016-01-24 DIAGNOSIS — E119 Type 2 diabetes mellitus without complications: Secondary | ICD-10-CM | POA: Diagnosis not present

## 2016-01-24 DIAGNOSIS — R269 Unspecified abnormalities of gait and mobility: Secondary | ICD-10-CM | POA: Diagnosis not present

## 2016-01-24 DIAGNOSIS — L89152 Pressure ulcer of sacral region, stage 2: Secondary | ICD-10-CM | POA: Diagnosis not present

## 2016-01-24 DIAGNOSIS — C3492 Malignant neoplasm of unspecified part of left bronchus or lung: Secondary | ICD-10-CM | POA: Diagnosis not present

## 2016-01-24 DIAGNOSIS — M6281 Muscle weakness (generalized): Secondary | ICD-10-CM | POA: Diagnosis not present

## 2016-01-24 DIAGNOSIS — D61810 Antineoplastic chemotherapy induced pancytopenia: Secondary | ICD-10-CM | POA: Diagnosis not present

## 2016-01-26 ENCOUNTER — Other Ambulatory Visit: Payer: Self-pay | Admitting: *Deleted

## 2016-01-26 ENCOUNTER — Telehealth: Payer: Self-pay | Admitting: Family Medicine

## 2016-01-26 DIAGNOSIS — L89152 Pressure ulcer of sacral region, stage 2: Secondary | ICD-10-CM | POA: Diagnosis not present

## 2016-01-26 DIAGNOSIS — D61810 Antineoplastic chemotherapy induced pancytopenia: Secondary | ICD-10-CM | POA: Diagnosis not present

## 2016-01-26 DIAGNOSIS — C3492 Malignant neoplasm of unspecified part of left bronchus or lung: Secondary | ICD-10-CM | POA: Diagnosis not present

## 2016-01-26 DIAGNOSIS — M6281 Muscle weakness (generalized): Secondary | ICD-10-CM | POA: Diagnosis not present

## 2016-01-26 DIAGNOSIS — E119 Type 2 diabetes mellitus without complications: Secondary | ICD-10-CM | POA: Diagnosis not present

## 2016-01-26 DIAGNOSIS — R269 Unspecified abnormalities of gait and mobility: Secondary | ICD-10-CM | POA: Diagnosis not present

## 2016-01-26 NOTE — Telephone Encounter (Signed)
[facility]  Patient Name: Shawn Espinoza  DOB: 1941-06-13    Initial Comment Caller states husband has been having a problem with phlegm- sounds like acid reflux- whats something over the counter he can take with the medication he takes- nauseous - vomiting a little- [facility] patient but hasnt been on chemo for awhile    Nurse Assessment  Nurse: Wynetta Emery, RN, Baker Janus Date/Time Eilene Ghazi Time): 01/26/2016 8:47:11 AM  Confirm and document reason for call. If symptomatic, describe symptoms. You must click the next button to save text entered. ---[name] has lung cancer and coughs alot -- gets up and has sick stomach and may be acid reflux  Has the patient traveled out of the country within the last 30 days? ---No  Does the patient have any new or worsening symptoms? ---Yes  Will a triage be completed? ---Yes  Related visit to physician within the last 2 weeks? ---No  Does the PT have any chronic conditions? (i.e. diabetes, asthma, etc.) ---Unknown  Is this a behavioral health or substance abuse call? ---No     Guidelines    Guideline Title Affirmed Question Affirmed Notes  Nausea Taking prescription medication that could cause nausea (e.g., narcotics/opiates, antibiotics, OCPs, many others)    Final Disposition User   Call PCP within 24 Hours [place], RN, Baker Janus    Comments  NOTE: 01-29-2016 1115am Dr. Leo Grosser c/o possible reflux n/v -- wanting a medication to help with this   Referrals  REFERRED TO PCP OFFICE   Disagree/Comply: Comply

## 2016-01-26 NOTE — Telephone Encounter (Signed)
Patient wife called requesting a refill of the carafate. Patient switched pharmacies. Updated in system. Rx pending.

## 2016-01-26 NOTE — Telephone Encounter (Signed)
Spoke w wife. They have carafate at home they will try. Will update me if ineffective.

## 2016-01-29 ENCOUNTER — Other Ambulatory Visit: Payer: Self-pay | Admitting: Medical Oncology

## 2016-01-29 ENCOUNTER — Ambulatory Visit: Payer: Self-pay | Admitting: Family Medicine

## 2016-01-29 MED ORDER — SUCRALFATE 1 GM/10ML PO SUSP: 1.0000 g | Freq: Three times a day (TID) | ORAL | Status: DC

## 2016-01-29 MED ORDER — TRAMADOL HCL 50 MG PO TABS: 50.0000 mg | ORAL_TABLET | Freq: Four times a day (QID) | ORAL | Status: DC | PRN

## 2016-01-29 NOTE — Telephone Encounter (Signed)
Called wife to advise Carafate was sent to the pharmacy. Patient needs a refill of the Tramadol also. Wife would like script called into the [facility]. Pended medication.

## 2016-01-29 NOTE — Addendum Note (Signed)
Addended by: Jethro Bolus A on: 01/29/2016 10:04 AM   Modules accepted: Orders

## 2016-01-30 DIAGNOSIS — C3492 Malignant neoplasm of unspecified part of left bronchus or lung: Secondary | ICD-10-CM | POA: Diagnosis not present

## 2016-01-30 DIAGNOSIS — E119 Type 2 diabetes mellitus without complications: Secondary | ICD-10-CM | POA: Diagnosis not present

## 2016-01-30 DIAGNOSIS — D61810 Antineoplastic chemotherapy induced pancytopenia: Secondary | ICD-10-CM | POA: Diagnosis not present

## 2016-01-30 DIAGNOSIS — R269 Unspecified abnormalities of gait and mobility: Secondary | ICD-10-CM | POA: Diagnosis not present

## 2016-01-30 DIAGNOSIS — L89152 Pressure ulcer of sacral region, stage 2: Secondary | ICD-10-CM | POA: Diagnosis not present

## 2016-01-30 DIAGNOSIS — M6281 Muscle weakness (generalized): Secondary | ICD-10-CM | POA: Diagnosis not present

## 2016-02-02 DIAGNOSIS — M6281 Muscle weakness (generalized): Secondary | ICD-10-CM | POA: Diagnosis not present

## 2016-02-02 DIAGNOSIS — C3492 Malignant neoplasm of unspecified part of left bronchus or lung: Secondary | ICD-10-CM | POA: Diagnosis not present

## 2016-02-02 DIAGNOSIS — L89152 Pressure ulcer of sacral region, stage 2: Secondary | ICD-10-CM | POA: Diagnosis not present

## 2016-02-02 DIAGNOSIS — D61810 Antineoplastic chemotherapy induced pancytopenia: Secondary | ICD-10-CM | POA: Diagnosis not present

## 2016-02-02 DIAGNOSIS — R269 Unspecified abnormalities of gait and mobility: Secondary | ICD-10-CM | POA: Diagnosis not present

## 2016-02-02 DIAGNOSIS — E119 Type 2 diabetes mellitus without complications: Secondary | ICD-10-CM | POA: Diagnosis not present

## 2016-02-07 ENCOUNTER — Ambulatory Visit (INDEPENDENT_AMBULATORY_CARE_PROVIDER_SITE_OTHER): Payer: Medicare Other | Admitting: Podiatry

## 2016-02-07 ENCOUNTER — Encounter: Payer: Self-pay | Admitting: Podiatry

## 2016-02-07 DIAGNOSIS — M6281 Muscle weakness (generalized): Secondary | ICD-10-CM | POA: Diagnosis not present

## 2016-02-07 DIAGNOSIS — M79676 Pain in unspecified toe(s): Secondary | ICD-10-CM

## 2016-02-07 DIAGNOSIS — R269 Unspecified abnormalities of gait and mobility: Secondary | ICD-10-CM | POA: Diagnosis not present

## 2016-02-07 DIAGNOSIS — D61810 Antineoplastic chemotherapy induced pancytopenia: Secondary | ICD-10-CM | POA: Diagnosis not present

## 2016-02-07 DIAGNOSIS — B351 Tinea unguium: Secondary | ICD-10-CM | POA: Diagnosis not present

## 2016-02-07 DIAGNOSIS — E119 Type 2 diabetes mellitus without complications: Secondary | ICD-10-CM | POA: Diagnosis not present

## 2016-02-07 DIAGNOSIS — L89152 Pressure ulcer of sacral region, stage 2: Secondary | ICD-10-CM | POA: Diagnosis not present

## 2016-02-07 DIAGNOSIS — C3492 Malignant neoplasm of unspecified part of left bronchus or lung: Secondary | ICD-10-CM | POA: Diagnosis not present

## 2016-02-07 NOTE — Progress Notes (Signed)
Patient ID: Shawn Espinoza, male   DOB: [birth year], 75 y.o.   MRN: [id number]  Subjective: This patient presents again for scheduled visit complaining that his toenails are elongated and thickened INR comfortable walking wearing shoes and requesting nail debridement  Objective: Orientated 3 Vascular: DP and PT pulses 1/4 bilaterally Capillary reflex immediate bilaterally  Neurological: Sensation to 10 g monofilament wire intact 4/5 right and 1/5 left Vibratory sensation nonreactive bilaterally Ankle reflex reactive bilaterally  Dermatological: Atrophic skin without hair growth bilaterally The toenails are elongated, brittle, friable, incurvated and tender to direct palpation 6-10 No Open skin lesions bilaterally  Musculoskeletal: No deformities noted Patient walks with a roller walker  Assessment: 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

## 2016-02-07 NOTE — Patient Instructions (Signed)
Diabetes and Foot Care Diabetes may cause you to have problems because of poor blood supply (circulation) to your feet and legs. This may cause the skin on your feet to become thinner, break easier, and heal more slowly. Your skin may become dry, and the skin may peel and crack. You may also have nerve damage in your legs and feet causing decreased feeling in them. You may not notice minor injuries to your feet that could lead to infections or more serious problems. Taking care of your feet is one of the most important things you can do for yourself.  HOME CARE INSTRUCTIONS  Wear shoes at all times, even in the house. Do not go barefoot. Bare feet are easily injured.  Check your feet daily for blisters, cuts, and redness. If you cannot see the bottom of your feet, use a mirror or ask someone for help.  Wash your feet with warm water (do not use hot water) and mild soap. Then pat your feet and the areas between your toes until they are completely dry. Do not soak your feet as this can dry your skin.  Apply a moisturizing lotion or petroleum jelly (that does not contain alcohol and is unscented) to the skin on your feet and to dry, brittle toenails. Do not apply lotion between your toes.  Trim your toenails straight across. Do not dig under them or around the cuticle. File the edges of your nails with an emery board or nail file.  Do not cut corns or calluses or try to remove them with medicine.  Wear clean socks or stockings every day. Make sure they are not too tight. Do not wear knee-high stockings since they may decrease blood flow to your legs.  Wear shoes that fit properly and have enough cushioning. To break in new shoes, wear them for just a few hours a day. This prevents you from injuring your feet. Always look in your shoes before you put them on to be sure there are no objects inside.  Do not cross your legs. This may decrease the blood flow to your feet.  If you find a minor scrape,  cut, or break in the skin on your feet, keep it and the skin around it clean and dry. These areas may be cleansed with mild soap and water. Do not cleanse the area with peroxide, alcohol, or iodine.  When you remove an adhesive bandage, be sure not to damage the skin around it.  If you have a wound, look at it several times a day to make sure it is healing.  Do not use heating pads or hot water bottles. They may burn your skin. If you have lost feeling in your feet or legs, you may not know it is happening until it is too late.  Make sure your health care provider performs a complete foot exam at least annually or more often if you have foot problems. Report any cuts, sores, or bruises to your health care provider immediately. SEEK MEDICAL CARE IF:   You have an injury that is not healing.  You have cuts or breaks in the skin.  You have an ingrown nail.  You notice redness on your legs or feet.  You feel burning or tingling in your legs or feet.  You have pain or cramps in your legs and feet.  Your legs or feet are numb.  Your feet always feel cold. SEEK IMMEDIATE MEDICAL CARE IF:   There is increasing redness,   swelling, or pain in or around a wound.  There is a red line that goes up your leg.  Pus is coming from a wound.  You develop a fever or as directed by your health care provider.  You notice a bad smell coming from an ulcer or wound.   This information is not intended to replace advice given to you by your health care provider. Make sure you discuss any questions you have with your health care provider.   Document Released: 12/06/2000 Document Revised: 08/11/2013 Document Reviewed: 05/18/2013 Elsevier Interactive Patient Education 2016 Elsevier Inc.  

## 2016-02-08 ENCOUNTER — Ambulatory Visit (INDEPENDENT_AMBULATORY_CARE_PROVIDER_SITE_OTHER): Payer: Medicare Other | Admitting: Ophthalmology

## 2016-02-08 DIAGNOSIS — E11311 Type 2 diabetes mellitus with unspecified diabetic retinopathy with macular edema: Secondary | ICD-10-CM | POA: Diagnosis not present

## 2016-02-08 DIAGNOSIS — I10 Essential (primary) hypertension: Secondary | ICD-10-CM

## 2016-02-08 DIAGNOSIS — H43813 Vitreous degeneration, bilateral: Secondary | ICD-10-CM

## 2016-02-08 DIAGNOSIS — H35371 Puckering of macula, right eye: Secondary | ICD-10-CM | POA: Diagnosis not present

## 2016-02-08 DIAGNOSIS — E113293 Type 2 diabetes mellitus with mild nonproliferative diabetic retinopathy without macular edema, bilateral: Secondary | ICD-10-CM | POA: Diagnosis not present

## 2016-02-08 DIAGNOSIS — H353132 Nonexudative age-related macular degeneration, bilateral, intermediate dry stage: Secondary | ICD-10-CM

## 2016-02-08 DIAGNOSIS — H35033 Hypertensive retinopathy, bilateral: Secondary | ICD-10-CM | POA: Diagnosis not present

## 2016-02-08 LAB — HM DIABETES EYE EXAM

## 2016-02-09 ENCOUNTER — Telehealth: Payer: Self-pay | Admitting: Family Medicine

## 2016-02-09 DIAGNOSIS — C3492 Malignant neoplasm of unspecified part of left bronchus or lung: Secondary | ICD-10-CM | POA: Diagnosis not present

## 2016-02-09 DIAGNOSIS — D61810 Antineoplastic chemotherapy induced pancytopenia: Secondary | ICD-10-CM | POA: Diagnosis not present

## 2016-02-09 DIAGNOSIS — R269 Unspecified abnormalities of gait and mobility: Secondary | ICD-10-CM | POA: Diagnosis not present

## 2016-02-09 DIAGNOSIS — L89152 Pressure ulcer of sacral region, stage 2: Secondary | ICD-10-CM | POA: Diagnosis not present

## 2016-02-09 DIAGNOSIS — E119 Type 2 diabetes mellitus without complications: Secondary | ICD-10-CM | POA: Diagnosis not present

## 2016-02-09 DIAGNOSIS — M6281 Muscle weakness (generalized): Secondary | ICD-10-CM | POA: Diagnosis not present

## 2016-02-09 NOTE — Telephone Encounter (Signed)
Prior to last week it had been ~120's. Then last week it has been going up to 150's and today was 165. She also said he has lost quite a bit of weight and that [facility] has discharged him and mentioned possibly coming off O2. I went ahead and scheduled him a follow up for next week to discuss all of that with you. She is going to just monitor his sugars over the weekend and discuss them with you at his appt.

## 2016-02-09 NOTE — Telephone Encounter (Signed)
plz call - what are last several fasting readings? If persistently >150 fasting in am, ok to start glipizide '5mg'$  1/2 tablet daily with breakfast. Will need to monitor for low sugars.  Alternatively would just get some more readings over weekend and call on Monday with update. 165 am sugar is not dangerously high  Lab Results  Component Value Date   HGBA1C 4.9 10/18/2015

## 2016-02-09 NOTE — Telephone Encounter (Signed)
[place] Call [facility] Patient Name: Shawn Espinoza DOB: December 03, 1941 Initial Comment Caller states her husbands BS is going up. 165, he's a cancer patient. He's lost 20 lbs in a month. He's getting over pneumonia and septic shock. Nurse Assessment Nurse: Mechele Dawley, RN, Amy Date/Time Eilene Ghazi Time): 02/09/2016 8:53:12 AM Confirm and document reason for call. If symptomatic, describe symptoms. You must click the next button to save text entered. ---SPOUSE IS CALLING THIS MORNING. BS IS 165 THIS MORNING. SHE STATES THAT HE WILL TAKE GLYPIZIDE WHEN HE TAKES CHEMO. SHE KEEPS HIS DIET UNDER CONTROL. HE HAS NOT EAT ANYTHING TO CAUSE THE BS TO GO UP. HE HAS JUST GOT OVER THE ILLNESS AND SHE DOES NOT WANT TO TAKE HIM INTO THE ED. THE BS LEVELS ARE GOING UP MORE AND MORE. SHE STATES THAT HE HAS LOST 20 POUNDS IN A MONTH. Has the patient traveled out of the country within the last 30 days? ---Not Applicable Does the patient have any new or worsening symptoms? ---Yes Will a triage be completed? ---Yes Related visit to physician within the last 2 weeks? ---No Does the PT have any chronic conditions? (i.e. diabetes, asthma, etc.) ---Yes List chronic conditions. ---PLEASE SEE EPIC Is this a behavioral health or substance abuse call? ---No Guidelines Guideline Title Affirmed Question Affirmed Notes Diabetes - High Blood Sugar Blood glucose 60-240 mg/dl (3.5 -13 mmol/ l) (all triage questions negative) Final Disposition User [place], RN, Amy Comments [name] IS REQUESTING FOR DR. Leo Grosser OR HIS NURSE TO PLEASE CALL HER TODAY. SHE DOES NOT WANT TO GO INTO THE WEEKEND WITHOUT DIRECTION ON HIS BLOOD SUGAR ELEVATIONS. SHE STATES THAT THEY ARE SLOWLY INCREASING AND SHE IS VERY WORRIED. SHE DOES NOT WANT TO TAKE HIM INTO THE ED AS HE IS SICK AND SHE DOES NOT WANT HIM TO GET SICKER BY GOING INTO THE ED. SHE JUST  NEEDS DIRECTION ON WHAT TO GIVE HIM. SHE HAS THE ORAL MEDICATION THAT SHE CAN GIVE HIM. PLEASE CALL HER TODAY. SHE HAS A MD APPT AND WILL HAVE HER CELLPHONE WITH HER AT ALL TIMES. [place] NOTE:

## 2016-02-11 ENCOUNTER — Encounter: Payer: Self-pay | Admitting: Family Medicine

## 2016-02-11 DIAGNOSIS — E11319 Type 2 diabetes mellitus with unspecified diabetic retinopathy without macular edema: Secondary | ICD-10-CM

## 2016-02-11 DIAGNOSIS — E118 Type 2 diabetes mellitus with unspecified complications: Secondary | ICD-10-CM | POA: Insufficient documentation

## 2016-02-12 ENCOUNTER — Other Ambulatory Visit: Payer: Self-pay | Admitting: *Deleted

## 2016-02-12 ENCOUNTER — Other Ambulatory Visit: Payer: Self-pay | Admitting: Medical Oncology

## 2016-02-13 ENCOUNTER — Encounter: Payer: Self-pay | Admitting: Family Medicine

## 2016-02-13 ENCOUNTER — Ambulatory Visit (INDEPENDENT_AMBULATORY_CARE_PROVIDER_SITE_OTHER): Payer: Medicare Other | Admitting: Family Medicine

## 2016-02-13 VITALS — BP 110/64 | HR 74 | Temp 98.1°F | Wt 164.8 lb

## 2016-02-13 DIAGNOSIS — E46 Unspecified protein-calorie malnutrition: Secondary | ICD-10-CM

## 2016-02-13 DIAGNOSIS — R634 Abnormal weight loss: Secondary | ICD-10-CM

## 2016-02-13 DIAGNOSIS — T380X5A Adverse effect of glucocorticoids and synthetic analogues, initial encounter: Secondary | ICD-10-CM | POA: Diagnosis not present

## 2016-02-13 DIAGNOSIS — I480 Paroxysmal atrial fibrillation: Secondary | ICD-10-CM

## 2016-02-13 DIAGNOSIS — E099 Drug or chemical induced diabetes mellitus without complications: Secondary | ICD-10-CM

## 2016-02-13 DIAGNOSIS — C3492 Malignant neoplasm of unspecified part of left bronchus or lung: Secondary | ICD-10-CM

## 2016-02-13 DIAGNOSIS — J189 Pneumonia, unspecified organism: Secondary | ICD-10-CM

## 2016-02-13 DIAGNOSIS — E8809 Other disorders of plasma-protein metabolism, not elsewhere classified: Secondary | ICD-10-CM

## 2016-02-13 DIAGNOSIS — D702 Other drug-induced agranulocytosis: Secondary | ICD-10-CM

## 2016-02-13 DIAGNOSIS — E11319 Type 2 diabetes mellitus with unspecified diabetic retinopathy without macular edema: Secondary | ICD-10-CM

## 2016-02-13 LAB — CBC WITH DIFFERENTIAL/PLATELET
BASOS PCT: 0.4 % (ref 0.0–3.0)
Basophils Absolute: 0 10*3/uL (ref 0.0–0.1)
EOS ABS: 0.2 10*3/uL (ref 0.0–0.7)
Eosinophils Relative: 2.9 % (ref 0.0–5.0)
HCT: 37 % — ABNORMAL LOW (ref 39.0–52.0)
Hemoglobin: 12.1 g/dL — ABNORMAL LOW (ref 13.0–17.0)
LYMPHS ABS: 1 10*3/uL (ref 0.7–4.0)
Lymphocytes Relative: 18.9 % (ref 12.0–46.0)
MCHC: 32.7 g/dL (ref 30.0–36.0)
MCV: 87.8 fl (ref 78.0–100.0)
MONO ABS: 0.4 10*3/uL (ref 0.1–1.0)
Monocytes Relative: 8.1 % (ref 3.0–12.0)
NEUTROS ABS: 3.8 10*3/uL (ref 1.4–7.7)
Neutrophils Relative %: 69.7 % (ref 43.0–77.0)
PLATELETS: 126 10*3/uL — AB (ref 150.0–400.0)
RBC: 4.22 Mil/uL (ref 4.22–5.81)
RDW: 16.6 % — AB (ref 11.5–15.5)
WBC: 5.4 10*3/uL (ref 4.0–10.5)

## 2016-02-13 LAB — COMPREHENSIVE METABOLIC PANEL
ALT: 12 U/L (ref 0–53)
AST: 16 U/L (ref 0–37)
Albumin: 3.7 g/dL (ref 3.5–5.2)
Alkaline Phosphatase: 57 U/L (ref 39–117)
BUN: 27 mg/dL — AB (ref 6–23)
CHLORIDE: 103 meq/L (ref 96–112)
CO2: 29 meq/L (ref 19–32)
CREATININE: 1.11 mg/dL (ref 0.40–1.50)
Calcium: 9.2 mg/dL (ref 8.4–10.5)
GFR: 68.6 mL/min (ref >60.00)
Glucose, Bld: 128 mg/dL — ABNORMAL HIGH (ref 70–99)
Potassium: 5.1 mEq/L (ref 3.5–5.1)
SODIUM: 139 meq/L (ref 135–145)
Total Bilirubin: 0.5 mg/dL (ref 0.2–1.2)
Total Protein: 6.3 g/dL (ref 6.0–8.3)

## 2016-02-13 MED ORDER — TRAMADOL HCL 50 MG PO TABS: 50.0000 mg | ORAL_TABLET | Freq: Four times a day (QID) | ORAL | Status: DC | PRN

## 2016-02-13 MED ORDER — SUCRALFATE 1 G PO TABS: 1.0000 g | ORAL_TABLET | Freq: Three times a day (TID) | ORAL | Status: AC

## 2016-02-13 NOTE — Assessment & Plan Note (Signed)
Appreciate onc care of patient.  Has been feeling well after HCAP resolution, with holiday off chemo for last 3 months. However concerning weight loss noted over last 6 weeks.  ?spironolactone effect (started while in nursing home after recent hospitalization) vs possible cancer progression. rec return to Onc for re eval, see if sooner imaging merited. Pt and wife agree with plan.

## 2016-02-13 NOTE — Assessment & Plan Note (Signed)
Check CMP today. PRN glucerna. Appetite has picked up off chemo.

## 2016-02-13 NOTE — Progress Notes (Signed)
Pre visit review using our clinic review tool, if applicable. No additional management support is needed unless otherwise documented below in the visit note. 

## 2016-02-13 NOTE — Assessment & Plan Note (Signed)
?  diuretic induced vs progression of cancer.  Check labs, will call to schedule sooner appt with onc.

## 2016-02-13 NOTE — Assessment & Plan Note (Signed)
Symptoms have resolved.

## 2016-02-13 NOTE — Patient Instructions (Addendum)
labwork today - this will guide plan for sugar and water pills.  Ambulatory pulse ox today. With recent weight loss, I'd like you to return to see Caren Griffins in next 1-2 weeks - pass by Marion's office for this.  Good to see you today, call us with questions.

## 2016-02-13 NOTE — Assessment & Plan Note (Signed)
Continue eliquis. Check EKG next visit.

## 2016-02-13 NOTE — Assessment & Plan Note (Signed)
Check fructosamine today. Off steroids for last 3 months.

## 2016-02-13 NOTE — Assessment & Plan Note (Signed)
New diabetic retinopathy on latest eye exam (01/2016). Worsening sugar control endorsed despite no recent steroid use and weight loss. Check fructosamine level today.  CT 11/2015 without pancreatic involvement.

## 2016-02-13 NOTE — Progress Notes (Signed)
BP 110/64 mmHg  Pulse 74  Temp(Src) 98.1 F (36.7 C) (Oral)  Wt 164 lb 12 oz (74.73 kg)  SpO2 97%   CC: multiple concerns  Subjective:    Patient ID: Shawn Espinoza, male    DOB: [birth year], 75 y.o.   MRN: [id number]  HPI: Shawn Espinoza is a 75 y.o. male presenting on 02/13/2016 for discuss sugar, weight loss and discuss getting off of oxygen   See prior notes for details.   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 S/p hospitalization for HCAP 11/2015, needed 2nd levaquin course to fully clear PNA. Took a break from chemo after this. Last chemo 11/21/2015. On glipizide '5mg'$  daily with breakfast on decadron days for steroid induced diabetes.   Recent worsening GERD treated with carafate and continued PPI  Noticed 20+ lb weight loss over last 6 wks.  Continues torsemide '20mg'$  once daily with 2nd if needed as well as spironolactone '50mg'$  daily (started in [facility]).   Worsening sugar control - 140-160 fasting last several readings. Occasional glucerna supplement. Appetite improved off chemo. Lab Results  Component Value Date   HGBA1C 4.9 10/18/2015     Asks about oxygen use. Currently using O2 as needed. No significant desaturation even with limited exertion (walking around the house).  Using cane regularly.  Working with outpatient PT - last planned session Friday.  R hearing loss noted.   Relevant past medical, surgical, family and social history reviewed and updated as indicated. Interim medical history since our last visit reviewed. Allergies and medications reviewed and updated. Current Outpatient Prescriptions on File Prior to Visit  Medication Sig  . albuterol (PROVENTIL HFA;VENTOLIN HFA) 108 (90 BASE) MCG/ACT inhaler Inhale 2 puffs into the lungs every 6 (six) hours as needed for wheezing or shortness of breath.  . Alum & Mag Hydroxide-Simeth (MAGIC MOUTHWASH W/LIDOCAINE) SOLN Take 5 mLs by mouth 3 (three) times daily as needed for mouth pain.  Marland Kitchen  apixaban (ELIQUIS) 5 MG TABS tablet Take 1 tablet (5 mg total) by mouth 2 (two) times daily. Restart when platelets >50.  Marland Kitchen dexamethasone (DECADRON) 4 MG tablet 2 tab po bid, the day before, day of and day after chemotherapy every 3 weeks  . diphenoxylate-atropine (LOMOTIL) 2.5-0.025 MG per tablet Take 2 tablets by mouth 4 (four) times daily as needed for diarrhea or loose stools.  Marland Kitchen glipiZIDE (GLUCOTROL) 5 MG tablet Take 1 tablet (5 mg total) by mouth as directed. Take one daily when taking steroid (dexamethasone).  Marland Kitchen guaiFENesin (MUCINEX) 600 MG 12 hr tablet Take 600 mg by mouth 2 (two) times daily as needed for cough or to loosen phlegm.   . hydrocortisone (ANUSOL-HC) 25 MG suppository Place 1 suppository (25 mg total) rectally 2 (two) times daily.  Marland Kitchen KLOR-CON M10 10 MEQ tablet Take 20 mEq by mouth daily as needed (with torsemide).   Marland Kitchen lidocaine-prilocaine (EMLA) cream Apply 1 application topically as needed.  . loratadine (CLARITIN) 10 MG tablet Take 10 mg by mouth as directed. Reported on 01/05/2016  . metoprolol tartrate (LOPRESSOR) 25 MG tablet TAKE 1 TABLET TWICE A DAY  . Multiple Vitamins-Minerals (ICAPS) CAPS Take 1 capsule by mouth 2 (two) times daily.   . polyethylene glycol (MIRALAX / GLYCOLAX) packet Take 8.5 g by mouth daily as needed for moderate constipation.   Marland Kitchen spironolactone (ALDACTONE) 50 MG tablet Take 1 tablet (50 mg total) by mouth daily.  Marland Kitchen torsemide (DEMADEX) 20 MG tablet Take 20 mg by mouth  2 (two) times daily. Takes one tablet daily and if swelling continues he takes a second pill   No current facility-administered medications on file prior to visit.    Review of Systems Per HPI unless specifically indicated in ROS section     Objective:    BP 110/64 mmHg  Pulse 74  Temp(Src) 98.1 F (36.7 C) (Oral)  Wt 164 lb 12 oz (74.73 kg)  SpO2 97%  Wt Readings from Last 3 Encounters:  02/13/16 164 lb 12 oz (74.73 kg)  01/05/16 183 lb 3.2 oz (83.099 kg)  12/28/15 195  lb 8 oz (88.678 kg)    Physical Exam  Constitutional: He appears well-developed and well-nourished. No distress.  HENT:  Mouth/Throat: Oropharynx is clear and moist. No oropharyngeal exudate.  Fluid behind R TM - rec nasal saline/steroid  Eyes: Conjunctivae and EOM are normal. Pupils are equal, round, and reactive to light. No scleral icterus.  Cardiovascular: Normal rate, regular rhythm, normal heart sounds and intact distal pulses.   No murmur heard. sounds regular  Pulmonary/Chest: Effort normal and breath sounds normal. No respiratory distress. He has no wheezes. He has no rales.  Abdominal: Soft. Bowel sounds are normal. He exhibits no distension. There is no tenderness. There is no rebound and no guarding.  Musculoskeletal: He exhibits edema (improved).  Persistent stasis changes  Skin: Skin is warm and dry. No rash noted.  Psychiatric: He has a normal mood and affect.  Nursing note and vitals reviewed.  Results for orders placed or performed in visit on 02/11/16  HM DIABETES EYE EXAM  Result Value Ref Range   HM Diabetic Eye Exam Retinopathy (A) No Retinopathy   *Note: Due to a large number of results and/or encounters for the requested time period, some results have not been displayed. A complete set of results can be found in Results Review.      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 Off steroids for last 3 months.      Relevant Orders   Fructosamine   Paroxysmal a-fib (HCC)    Continue eliquis. Check EKG next visit.      Neutropenia, drug-induced ([place])   Relevant Orders   CBC with Differential/Platelet   Loss of weight    ?diuretic induced vs progression of cancer.  Check labs, will call to schedule sooner appt with onc.      Relevant Orders   Ambulatory referral to Oncology   Hypoalbuminemia due to protein-calorie malnutrition ([place])    Check CMP today. PRN  glucerna. Appetite has picked up off chemo.      Relevant Orders   Comprehensive metabolic panel   RESOLVED: HCAP (healthcare-associated pneumonia)    Symptoms have resolved.      Diabetes mellitus type 2 with retinopathy ([place])    New diabetic retinopathy on latest eye exam (01/2016). Worsening sugar control endorsed despite no recent steroid use and weight loss. Check fructosamine level today.  CT 11/2015 without pancreatic involvement.      Relevant Orders   Comprehensive metabolic panel   Fructosamine   Adenocarcinoma of left lung, stage 4 ([place]) - Primary    Appreciate onc care of patient.  Has been feeling well after HCAP resolution, with holiday off chemo for last 3 months. However concerning weight loss noted over last 6 weeks.  ?spironolactone effect (started while in nursing home after recent hospitalization) vs possible cancer progression.  rec return to Onc for re eval, see if sooner imaging merited. Pt and wife agree with plan.      Relevant Medications   traMADol (ULTRAM) 50 MG tablet   Other Relevant Orders   Ambulatory referral to Oncology       Follow up plan: Return in about 4 weeks (around 03/12/2016), or if symptoms worsen or fail to improve, for follow up visit.

## 2016-02-14 ENCOUNTER — Ambulatory Visit (INDEPENDENT_AMBULATORY_CARE_PROVIDER_SITE_OTHER): Payer: Medicare Other | Admitting: Cardiothoracic Surgery

## 2016-02-14 ENCOUNTER — Encounter: Payer: Self-pay | Admitting: Cardiothoracic Surgery

## 2016-02-14 ENCOUNTER — Other Ambulatory Visit: Payer: Self-pay | Admitting: *Deleted

## 2016-02-14 VITALS — BP 116/84 | HR 85 | Resp 18 | Ht 69.0 in | Wt 164.0 lb

## 2016-02-14 DIAGNOSIS — J910 Malignant pleural effusion: Secondary | ICD-10-CM | POA: Diagnosis not present

## 2016-02-15 LAB — FRUCTOSAMINE: FRUCTOSAMINE: 244 umol/L (ref 190–270)

## 2016-02-15 NOTE — Progress Notes (Signed)
PCP is [name], MD Referring Provider is Curt Bears, MD  Chief Complaint  Patient presents with  . Routine Post Op    WANTING HIS PORT CHECKED DUE TO SORENESS...is in physical therapy ? source of discomfort    HPI:the patient returns to discuss his Port-A-Cath which I placed approximately 2 years ago. The patient has stage IV non-small cell carcinoma lung and is being treated with chemotherapy directed by Dr. [name]. His last canceled fairly clean. He did have a left Pleurx catheter one point but that has been removed with the resolution of the malignant effusion.  He states he has lost a significant amount of weight and a Port-A-Cath is protruding under the skin. Has not eroded. There is been no drainage. He still uses the Port-A-Cath for blood draws and wants to protect and preserve it is possible. He recently recovered from a fairly severe pneumonia and has not had chemotherapy and several weeks.   Past Medical History  Diagnosis Date  . History of diabetes mellitus, type II     resolved with diet, h/o neuropathy  . Diverticulosis of colon   . Hyperlipidemia   . Hypertension   . Fatty liver 02/29/00    abd ultrasound  . Lower back pain   . Systolic murmur 8101    2Decho - normal LV fxn, EF 55%, mild AS, biatrial enlargement  . History of tobacco use quit 1990s  . 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 Personal history of colonic adenomas 07/06/2013  . Hypertensive retinopathy of both eyes 2015    mild  . Dysrhythmia     Atrial fib (found 07/2014)  . Shortness of breath   . Pneumonia   . GERD (gastroesophageal reflux disease)     occasional  . Arthritis   . Neuropathy ([place])   . Constipation   . 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 Diabetes mellitus type 2 with retinopathy ([place])   . Depression   . Non-small cell  carcinoma of left lung ([place]) 02/2014    stage IIIb/IV on chemo  . Full code status 09/26/2015  . Atrial fibrillation ([place])   . Lobar pneumonia ([place]) 10/18/2015  . HCAP (healthcare-associated pneumonia) 11/27/2015    IV vanc/zosyn then levaquin courses x2     Past Surgical History  Procedure Laterality Date  . Colonoscopy  2004  . Colonoscopy  2014    tubular adenoma x1, mod diverticulosis ([place])  . Flexible bronchoscopy  02/2014    WNL  . Video bronchoscopy Bilateral 03/17/2014    Procedure: VIDEO BRONCHOSCOPY WITH FLUORO;  Surgeon: Tanda Rockers, MD;  Location: [facility];  Service: Cardiopulmonary;  Laterality: Bilateral;  . Chest tube insertion Left 08/26/2014    Procedure: INSERTION OF LEFT  PLEURAL DRAINAGE CATHETER;  Surgeon: Ivin Poot, MD;  Location: [facility];  Service: Thoracic;  Laterality: Left;  . Cataract extraction Right 03/2015    Dr [name]  . Portacath placement Right 03/31/2015    Procedure: INSERTION PORT-A-CATH;  Surgeon: Ivin Poot, MD;  Location: [facility];  Service: Thoracic;  Laterality: Right;  . Removal of pleural drainage catheter Left 03/31/2015    Procedure: REMOVAL OF PLEURAL DRAINAGE CATHETER;  Surgeon: Ivin Poot, MD;  Location: [facility];  Service: Thoracic;  Laterality: Left;    Family History  Problem Relation Age of Onset  . Stroke Mother     multiple mini strokes  . Hypertension Mother   . Heart disease Maternal Grandfather     MI  . Stroke Paternal Grandmother   . Colon cancer Neg Hx     Social History Social History  Substance Use Topics  . Smoking status: Former Smoker -- 3.00 packs/day for 30 years    Types: Cigarettes    Quit date: 12/23/1988  . Smokeless tobacco: Never Used  . Alcohol Use: 12.6 oz/week    21 Glasses of wine per week     Comment: 3-4 wine a day ( 8/03- 2 beers, 2 wines, 2 brandies daily)    Current Outpatient Prescriptions  Medication Sig Dispense Refill  . albuterol (PROVENTIL HFA;VENTOLIN HFA) 108 (90 BASE)  MCG/ACT inhaler Inhale 2 puffs into the lungs every 6 (six) hours as needed for wheezing or shortness of breath. 1 Inhaler 3  . Alum & Mag Hydroxide-Simeth (MAGIC MOUTHWASH W/LIDOCAINE) SOLN Take 5 mLs by mouth 3 (three) times daily as needed for mouth pain. 5 mL 0  . apixaban (ELIQUIS) 5 MG TABS tablet Take 1 tablet (5 mg total) by mouth 2 (two) times daily. Restart when platelets >50.    Marland Kitchen dexamethasone (DECADRON) 4 MG tablet 2 tab po bid, the day before, day of and day after chemotherapy every 3 weeks 40 tablet 1  . diphenoxylate-atropine (LOMOTIL) 2.5-0.025 MG per tablet Take 2 tablets by mouth 4 (four) times daily as needed for diarrhea or loose stools. 30 tablet 0  . glipiZIDE (GLUCOTROL) 5 MG tablet Take 1 tablet (5 mg total) by mouth as directed. Take one daily when taking steroid (dexamethasone).    Marland Kitchen guaiFENesin (MUCINEX) 600 MG 12 hr tablet Take 600 mg by mouth 2 (two) times daily as needed for cough or to loosen phlegm.     . hydrocortisone (ANUSOL-HC) 25 MG suppository Place 1 suppository (25 mg total) rectally 2 (two) times daily. 12 suppository 0  . KLOR-CON M10 10 MEQ tablet Take 20 mEq by mouth daily as needed (with torsemide).   10  . lidocaine-prilocaine (EMLA) cream Apply 1 application topically as needed. 30 g 1  . loratadine (CLARITIN) 10 MG tablet Take 10 mg by mouth as directed. Reported on 01/05/2016    . metoprolol tartrate (LOPRESSOR) 25 MG tablet TAKE 1 TABLET TWICE A DAY 60 tablet 3  . Multiple Vitamins-Minerals (ICAPS) CAPS Take 1 capsule by mouth 2 (two) times daily.     . polyethylene glycol (MIRALAX / GLYCOLAX) packet Take 8.5 g by mouth daily as needed for moderate constipation.     Marland Kitchen spironolactone (ALDACTONE) 50 MG tablet Take 1 tablet (50 mg total) by mouth daily. 30 tablet 3  . sucralfate (CARAFATE) 1 g tablet Take 1 tablet (1 g total) by mouth 3 (three) times daily before meals. 90 tablet 1  . torsemide (DEMADEX) 20 MG tablet Take 20 mg by mouth 2 (two) times  daily. Takes one tablet daily and if swelling continues he takes a second pill    . traMADol (ULTRAM) 50 MG tablet Take 1 tablet (50 mg total) by mouth every 6 (six) hours as needed. 30 tablet 3   No current facility-administered medications for this visit.    Allergies  Allergen Reactions  . Candesartan Cilexetil     REACTION: Muscle spasms  . Diltiazem Hcl     REACTION: Dizziness  . Doxazosin Mesylate Other (See Comments)  REACTION: H/A's  . Nifedipine     REACTION: Leg swelling  . Pravastatin Other (See Comments)    Muscle aches  . Red Yeast Rice     Muscle aches  . Rosuvastatin Other (See Comments)    REACTION: questionable: severe constipation  . Sertraline Other (See Comments)    oversedation  . Simvastatin Other (See Comments)    REACTION: Muscle aches  . Hydrocodone Other (See Comments)    constipation    Review of Systems  He has lost over 30 pounds in weight in the past year. He is still driving and can do his activities of daily living and self care. He is not smoking. He is having troubles with edema at BP 116/84 mmHg  Pulse 85  Resp 18  Ht '5\' 9"'$  (1.753 m)  Wt 164 lb (74.39 kg)  BMI 24.21 kg/m2  SpO2 97% Physical Exam Middle-aged male chronically ill-appearing but no distress Coarse rales left lower lung field Port-A-Cath reservoir beneath the dermis without erosion. The locator markers are easily palpable and actually are visible. Heart rate regular Woody edema of both lower extremities from the mid-tibia to the foot  Diagnostic Tests: none  Impression: The patient will try to protect the Port-A-Cath from ra rotating into the skin  Plan: He will place a soft pad over the reservoir to protect it from seatbelts etc. He will not sleep prone. He is toreport if  was if there is erosion or drainage and he understands at that point there would be no option except to remove the Port-A-Cath and probably place another one on the left side.  I will follow  up in about a month to inspect the Port-A-Cath site.  Len Childs, MD [facility] [phone]

## 2016-02-16 ENCOUNTER — Telehealth: Payer: Self-pay | Admitting: Internal Medicine

## 2016-02-16 ENCOUNTER — Other Ambulatory Visit: Payer: Self-pay | Admitting: Family Medicine

## 2016-02-16 ENCOUNTER — Other Ambulatory Visit: Payer: Self-pay | Admitting: Medical Oncology

## 2016-02-16 NOTE — Telephone Encounter (Signed)
S/W confirming labs/flush and md visit per 02/22 POF, per MD ok to schedule pt with [facility]... KJ

## 2016-02-19 DIAGNOSIS — C3492 Malignant neoplasm of unspecified part of left bronchus or lung: Secondary | ICD-10-CM | POA: Diagnosis not present

## 2016-02-19 DIAGNOSIS — E119 Type 2 diabetes mellitus without complications: Secondary | ICD-10-CM | POA: Diagnosis not present

## 2016-02-19 DIAGNOSIS — D61810 Antineoplastic chemotherapy induced pancytopenia: Secondary | ICD-10-CM | POA: Diagnosis not present

## 2016-02-19 DIAGNOSIS — R269 Unspecified abnormalities of gait and mobility: Secondary | ICD-10-CM | POA: Diagnosis not present

## 2016-02-19 DIAGNOSIS — M6281 Muscle weakness (generalized): Secondary | ICD-10-CM | POA: Diagnosis not present

## 2016-02-19 DIAGNOSIS — L89152 Pressure ulcer of sacral region, stage 2: Secondary | ICD-10-CM | POA: Diagnosis not present

## 2016-02-21 ENCOUNTER — Encounter: Payer: Self-pay | Admitting: Internal Medicine

## 2016-02-21 ENCOUNTER — Other Ambulatory Visit (HOSPITAL_BASED_OUTPATIENT_CLINIC_OR_DEPARTMENT_OTHER): Payer: Medicare Other

## 2016-02-21 ENCOUNTER — Other Ambulatory Visit: Payer: Medicare Other

## 2016-02-21 ENCOUNTER — Ambulatory Visit: Payer: Medicare Other

## 2016-02-21 ENCOUNTER — Ambulatory Visit (HOSPITAL_COMMUNITY)
Admission: RE | Admit: 2016-02-21 | Discharge: 2016-02-21 | Disposition: A | Discharge status: Home / Self Care | Payer: Medicare Other | Source: Ambulatory Visit | Attending: Internal Medicine | Admitting: Internal Medicine

## 2016-02-21 DIAGNOSIS — Z95828 Presence of other vascular implants and grafts: Secondary | ICD-10-CM

## 2016-02-21 DIAGNOSIS — Z9221 Personal history of antineoplastic chemotherapy: Secondary | ICD-10-CM | POA: Insufficient documentation

## 2016-02-21 DIAGNOSIS — R609 Edema, unspecified: Secondary | ICD-10-CM | POA: Diagnosis not present

## 2016-02-21 DIAGNOSIS — E46 Unspecified protein-calorie malnutrition: Secondary | ICD-10-CM

## 2016-02-21 DIAGNOSIS — D696 Thrombocytopenia, unspecified: Secondary | ICD-10-CM | POA: Diagnosis not present

## 2016-02-21 DIAGNOSIS — R590 Localized enlarged lymph nodes: Secondary | ICD-10-CM | POA: Insufficient documentation

## 2016-02-21 DIAGNOSIS — I4891 Unspecified atrial fibrillation: Secondary | ICD-10-CM | POA: Insufficient documentation

## 2016-02-21 DIAGNOSIS — J8489 Other specified interstitial pulmonary diseases: Secondary | ICD-10-CM | POA: Diagnosis not present

## 2016-02-21 DIAGNOSIS — C3492 Malignant neoplasm of unspecified part of left bronchus or lung: Secondary | ICD-10-CM

## 2016-02-21 DIAGNOSIS — J189 Pneumonia, unspecified organism: Secondary | ICD-10-CM | POA: Diagnosis not present

## 2016-02-21 DIAGNOSIS — M5124 Other intervertebral disc displacement, thoracic region: Secondary | ICD-10-CM | POA: Diagnosis not present

## 2016-02-21 DIAGNOSIS — R530 Neoplastic (malignant) related fatigue: Secondary | ICD-10-CM | POA: Diagnosis not present

## 2016-02-21 DIAGNOSIS — M5126 Other intervertebral disc displacement, lumbar region: Secondary | ICD-10-CM | POA: Insufficient documentation

## 2016-02-21 DIAGNOSIS — I7772 Dissection of iliac artery: Secondary | ICD-10-CM | POA: Insufficient documentation

## 2016-02-21 DIAGNOSIS — I5032 Chronic diastolic (congestive) heart failure: Secondary | ICD-10-CM | POA: Insufficient documentation

## 2016-02-21 DIAGNOSIS — K769 Liver disease, unspecified: Secondary | ICD-10-CM | POA: Diagnosis not present

## 2016-02-21 DIAGNOSIS — Z5111 Encounter for antineoplastic chemotherapy: Secondary | ICD-10-CM

## 2016-02-21 DIAGNOSIS — Z7901 Long term (current) use of anticoagulants: Secondary | ICD-10-CM | POA: Insufficient documentation

## 2016-02-21 LAB — COMPREHENSIVE METABOLIC PANEL
ALT: 10 U/L (ref 0–55)
ANION GAP: 10 meq/L (ref 3–11)
AST: 12 U/L (ref 5–34)
Albumin: 3.3 g/dL — ABNORMAL LOW (ref 3.5–5.0)
Alkaline Phosphatase: 64 U/L (ref 40–150)
BILIRUBIN TOTAL: 0.53 mg/dL (ref 0.20–1.20)
BUN: 25.9 mg/dL (ref 7.0–26.0)
CALCIUM: 8.8 mg/dL (ref 8.4–10.4)
CHLORIDE: 105 meq/L (ref 98–109)
CO2: 24 mEq/L (ref 22–29)
CREATININE: 1.1 mg/dL (ref 0.7–1.3)
EGFR: 67 mL/min/{1.73_m2} — ABNORMAL LOW (ref >90)
Glucose: 157 mg/dl — ABNORMAL HIGH (ref 70–140)
Potassium: 4.4 mEq/L (ref 3.5–5.1)
Sodium: 139 mEq/L (ref 136–145)
TOTAL PROTEIN: 6.2 g/dL — AB (ref 6.4–8.3)

## 2016-02-21 LAB — CBC WITH DIFFERENTIAL/PLATELET
BASO%: 1 % (ref 0.0–2.0)
Basophils Absolute: 0.1 10*3/uL (ref 0.0–0.1)
EOS%: 4.3 % (ref 0.0–7.0)
Eosinophils Absolute: 0.2 10*3/uL (ref 0.0–0.5)
HEMATOCRIT: 36.3 % — AB (ref 38.4–49.9)
HEMOGLOBIN: 11.9 g/dL — AB (ref 13.0–17.1)
LYMPH#: 0.8 10*3/uL — AB (ref 0.9–3.3)
LYMPH%: 15.4 % (ref 14.0–49.0)
MCH: 28.5 pg (ref 27.2–33.4)
MCHC: 32.7 g/dL (ref 32.0–36.0)
MCV: 87.2 fL (ref 79.3–98.0)
MONO#: 0.5 10*3/uL (ref 0.1–0.9)
MONO%: 9.4 % (ref 0.0–14.0)
NEUT%: 69.9 % (ref 39.0–75.0)
NEUTROS ABS: 3.5 10*3/uL (ref 1.5–6.5)
PLATELETS: 106 10*3/uL — AB (ref 140–400)
RBC: 4.16 10*6/uL — ABNORMAL LOW (ref 4.20–5.82)
RDW: 17 % — AB (ref 11.0–14.6)
WBC: 5.1 10*3/uL (ref 4.0–10.3)

## 2016-02-21 MED ORDER — IOHEXOL 300 MG/ML  SOLN
100.0000 mL | Freq: Once | INTRAMUSCULAR | Status: AC | PRN
  Administered 2016-02-21: 100 mL via INTRAVENOUS

## 2016-02-21 MED ORDER — SODIUM CHLORIDE 0.9% FLUSH
10.0000 mL | INTRAVENOUS | Status: DC | PRN
  Administered 2016-02-21: 10 mL via INTRAVENOUS
  Filled 2016-02-21: qty 10

## 2016-02-21 NOTE — Progress Notes (Signed)
I placed form for massage permission for dr. [name] to sign

## 2016-02-21 NOTE — Patient Instructions (Signed)
Patient to have port needle de-accessed after CT scan today before leaving [facility].  Implanted [facility] Guide An implanted port is a type of central line that is placed under the skin. Central lines are used to provide IV access when treatment or nutrition needs to be given through a person's veins. Implanted ports are used for long-term IV access. An implanted port may be placed because:   You need IV medicine that would be irritating to the small veins in your hands or arms.   You need long-term IV medicines, such as antibiotics.   You need IV nutrition for a long period.   You need frequent blood draws for lab tests.   You need dialysis.  Implanted ports are usually placed in the chest area, but they can also be placed in the upper arm, the abdomen, or the leg. An implanted port has two main parts:   Reservoir. The reservoir is round and will appear as a small, raised area under your skin. The reservoir is the part where a needle is inserted to give medicines or draw blood.   Catheter. The catheter is a thin, flexible tube that extends from the reservoir. The catheter is placed into a large vein. Medicine that is inserted into the reservoir goes into the catheter and then into the vein.  HOW WILL I CARE FOR MY INCISION SITE? Do not get the incision site wet. Bathe or shower as directed by your health care provider.  HOW IS MY PORT ACCESSED? Special steps must be taken to access the port:   Before the port is accessed, a numbing cream can be placed on the skin. This helps numb the skin over the port site.   Your health care provider uses a sterile technique to access the port.  Your health care provider must put on a mask and sterile gloves.  The skin over your port is cleaned carefully with an antiseptic and allowed to dry.  The port is gently pinched between sterile gloves, and a needle is inserted into the port.  Only "non-coring" port needles should  be used to access the port. Once the port is accessed, a blood return should be checked. This helps ensure that the port is in the vein and is not clogged.   If your port needs to remain accessed for a constant infusion, a clear (transparent) bandage will be placed over the needle site. The bandage and needle will need to be changed every week, or as directed by your health care provider.   Keep the bandage covering the needle clean and dry. Do not get it wet. Follow your health care provider's instructions on how to take a shower or bath while the port is accessed.   If your port does not need to stay accessed, no bandage is needed over the port.  WHAT IS FLUSHING? Flushing helps keep the port from getting clogged. Follow your health care provider's instructions on how and when to flush the port. Ports are usually flushed with saline solution or a medicine called heparin. The need for flushing will depend on how the port is used.   If the port is used for intermittent medicines or blood draws, the port will need to be flushed:   After medicines have been given.   After blood has been drawn.   As part of routine maintenance.   If a constant infusion is running, the port may not need to be flushed.  HOW LONG WILL  MY PORT STAY IMPLANTED? The port can stay in for as long as your health care provider thinks it is needed. When it is time for the port to come out, surgery will be done to remove it. The procedure is similar to the one performed when the port was put in.  WHEN SHOULD I SEEK IMMEDIATE MEDICAL CARE? When you have an implanted port, you should seek immediate medical care if:   You notice a bad smell coming from the incision site.   You have swelling, redness, or drainage at the incision site.   You have more swelling or pain at the port site or the surrounding area.   You have a fever that is not controlled with medicine.   This information is not intended to replace  advice given to you by your health care provider. Make sure you discuss any questions you have with your health care provider.   Document Released: 12/09/2005 Document Revised: 09/29/2013 Document Reviewed: 08/16/2013 Elsevier Interactive Patient Education [employer].

## 2016-02-22 ENCOUNTER — Encounter: Payer: Self-pay | Admitting: Internal Medicine

## 2016-02-22 NOTE — Progress Notes (Signed)
Form at front desk with ms wilma--massage ok signed by dr.

## 2016-02-26 ENCOUNTER — Ambulatory Visit (HOSPITAL_BASED_OUTPATIENT_CLINIC_OR_DEPARTMENT_OTHER): Payer: Medicare Other | Admitting: Oncology

## 2016-02-26 ENCOUNTER — Telehealth: Payer: Self-pay | Admitting: Internal Medicine

## 2016-02-26 ENCOUNTER — Other Ambulatory Visit: Payer: Medicare Other

## 2016-02-26 ENCOUNTER — Encounter: Payer: Self-pay | Admitting: Oncology

## 2016-02-26 VITALS — BP 108/58 | HR 68 | Temp 97.6°F | Resp 17 | Ht 69.0 in | Wt 165.4 lb

## 2016-02-26 DIAGNOSIS — R609 Edema, unspecified: Secondary | ICD-10-CM

## 2016-02-26 DIAGNOSIS — R530 Neoplastic (malignant) related fatigue: Secondary | ICD-10-CM

## 2016-02-26 DIAGNOSIS — C3411 Malignant neoplasm of upper lobe, right bronchus or lung: Secondary | ICD-10-CM | POA: Diagnosis not present

## 2016-02-26 DIAGNOSIS — J90 Pleural effusion, not elsewhere classified: Secondary | ICD-10-CM

## 2016-02-26 DIAGNOSIS — R634 Abnormal weight loss: Secondary | ICD-10-CM

## 2016-02-26 DIAGNOSIS — R599 Enlarged lymph nodes, unspecified: Secondary | ICD-10-CM

## 2016-02-26 DIAGNOSIS — C3492 Malignant neoplasm of unspecified part of left bronchus or lung: Secondary | ICD-10-CM

## 2016-02-26 DIAGNOSIS — I4891 Unspecified atrial fibrillation: Secondary | ICD-10-CM

## 2016-02-26 DIAGNOSIS — R05 Cough: Secondary | ICD-10-CM | POA: Diagnosis not present

## 2016-02-26 NOTE — Telephone Encounter (Signed)
Gave and printed appt sched and avs fo rpt for March and may

## 2016-02-26 NOTE — Progress Notes (Signed)
[place] [phone]   Fax:([phone]) [place], [place] [address]  DIAGNOSIS: Stage IIIB/IV non-small cell lung cancer, adenocarcinoma diagnosed in March of 2015.  PRIOR THERAPY:  1) Systemic chemotherapy with carboplatin for AUC of 5 and Alimta 500 mg/M2 every 3 weeks. First dose expected on 04/13/2014. Status post 6 cycles. 2)  Maintenance chemotherapy with single agent Alimta 500 mg/M2 every 3 weeks. First cycle on 09/06/2014. Status post 3 cycles. 3) Second course of systemic chemotherapy with carboplatin for AUC of 5 and Alimta 500 MG/M2 every 3 weeks. First cycle 11/08/2014. Status post 6 cycles. 4)  Immunotherapy with Nivolumab 3 MG/KG every 2 weeks. First dose 05/24/2015. Status post 3 cycles. 5) Systemic chemotherapy with docetaxel 60 MG/M2 and Cyramza 10 MG/KG every 3 weeks status post 6 cycles. Last dose was given 11/21/2015 with stable disease.   CURRENT THERAPY: Observation.  INTERVAL HISTORY: Shawn Espinoza 75 y.o. male returns to the clinic today for follow up visit accompanied by his wife. He completed 6 cycles systemic chemotherapy with docetaxel and Cyramza in November 2016 with stable disease. Was placed on observation at that time. The patient has been feeling well. His fatigue is improving and he is able to go the the [facility] 3 times a week for about an hour. His edema has significantly improved with diuretics. Weight loss if due to diuresis. He is eating well. He denied having any significant chest pain and shortness of breath. Continue to have a mild cough with no hemoptysis. He has no significant fever or chills, no nausea or vomiting. He had repeat CT scan of the chest, abdomen and pelvis performed recently and he is here for evaluation and discussion of his scan results.  MEDICAL HISTORY: Past Medical History  Diagnosis Date  . History of diabetes mellitus, type II      resolved with diet, h/o neuropathy  . Diverticulosis of colon   . Hyperlipidemia   . Hypertension   . Fatty liver 02/29/00    abd ultrasound  . Lower back pain   . Systolic murmur 4854    2Decho - normal LV fxn, EF 55%, mild AS, biatrial enlargement  . History of tobacco use quit 1990s  . 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 Personal history of colonic adenomas 07/06/2013  . Hypertensive retinopathy of both eyes 2015    mild  . Dysrhythmia     Atrial fib (found 07/2014)  . Shortness of breath   . Pneumonia   . GERD (gastroesophageal reflux disease)     occasional  . Arthritis   . Neuropathy ([place])   . Constipation   . 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 Diabetes mellitus type 2 with retinopathy ([place])   . Depression   . Non-small cell carcinoma of left lung ([place]) 02/2014    stage IIIb/IV on chemo  . Full code status 09/26/2015  . Atrial fibrillation ([place])   . Lobar pneumonia ([place]) 10/18/2015  . HCAP (healthcare-associated pneumonia) 11/27/2015    IV vanc/zosyn then levaquin courses x2     ALLERGIES:  is allergic to candesartan cilexetil; diltiazem hcl; doxazosin mesylate; nifedipine; pravastatin; red yeast rice; rosuvastatin; sertraline; simvastatin; and hydrocodone.  MEDICATIONS:  Current Outpatient  Prescriptions  Medication Sig Dispense Refill  . albuterol (PROVENTIL HFA;VENTOLIN HFA) 108 (90 BASE) MCG/ACT inhaler Inhale 2 puffs into the lungs every 6 (six) hours as needed for wheezing or shortness of breath. 1 Inhaler 3  . Alum & Mag Hydroxide-Simeth (MAGIC MOUTHWASH W/LIDOCAINE) SOLN Take 5 mLs by mouth 3 (three) times daily as needed for mouth pain. 5 mL 0  . apixaban (ELIQUIS) 5 MG TABS tablet Take 1 tablet (5 mg total) by mouth 2 (two) times daily. Restart when platelets >50.    Marland Kitchen dexamethasone (DECADRON) 4 MG tablet 2  tab po bid, the day before, day of and day after chemotherapy every 3 weeks 40 tablet 1  . diphenoxylate-atropine (LOMOTIL) 2.5-0.025 MG per tablet Take 2 tablets by mouth 4 (four) times daily as needed for diarrhea or loose stools. 30 tablet 0  . glipiZIDE (GLUCOTROL) 5 MG tablet Take 1 tablet (5 mg total) by mouth as directed. Take one daily when taking steroid (dexamethasone).    Marland Kitchen guaiFENesin (MUCINEX) 600 MG 12 hr tablet Take 600 mg by mouth 2 (two) times daily as needed for cough or to loosen phlegm.     . hydrocortisone (ANUSOL-HC) 25 MG suppository Place 1 suppository (25 mg total) rectally 2 (two) times daily. 12 suppository 0  . KLOR-CON M10 10 MEQ tablet Take 20 mEq by mouth daily as needed (with torsemide).   10  . lidocaine-prilocaine (EMLA) cream Apply 1 application topically as needed. 30 g 1  . loratadine (CLARITIN) 10 MG tablet Take 10 mg by mouth as directed. Reported on 01/05/2016    . metoprolol tartrate (LOPRESSOR) 25 MG tablet TAKE ONE TABLET BY MOUTH TWICE DAILY 60 tablet 2  . Multiple Vitamins-Minerals (ICAPS) CAPS Take 1 capsule by mouth 2 (two) times daily.     . polyethylene glycol (MIRALAX / GLYCOLAX) packet Take 8.5 g by mouth daily as needed for moderate constipation.     Marland Kitchen spironolactone (ALDACTONE) 50 MG tablet Take 1 tablet (50 mg total) by mouth daily. 30 tablet 3  . sucralfate (CARAFATE) 1 g tablet Take 1 tablet (1 g total) by mouth 3 (three) times daily before meals. 90 tablet 1  . torsemide (DEMADEX) 20 MG tablet Take 20 mg by mouth 2 (two) times daily. Takes one tablet daily and if swelling continues he takes a second pill    . traMADol (ULTRAM) 50 MG tablet Take 1 tablet (50 mg total) by mouth every 6 (six) hours as needed. 30 tablet 3   No current facility-administered medications for this visit.    SURGICAL HISTORY:  Past Surgical History  Procedure Laterality Date  . Colonoscopy  2004  . Colonoscopy  2014    tubular adenoma x1, mod diverticulosis  ([place])  . Flexible bronchoscopy  02/2014    WNL  . Video bronchoscopy Bilateral 03/17/2014    Procedure: VIDEO BRONCHOSCOPY WITH FLUORO;  Surgeon: Tanda Rockers, MD;  Location: [facility];  Service: Cardiopulmonary;  Laterality: Bilateral;  . Chest tube insertion Left 08/26/2014    Procedure: INSERTION OF LEFT  PLEURAL DRAINAGE CATHETER;  Surgeon: Ivin Poot, MD;  Location: [place];  Service: Thoracic;  Laterality: Left;  . Cataract extraction Right 03/2015    Dr [name]  . Portacath placement Right 03/31/2015    Procedure: INSERTION PORT-A-CATH;  Surgeon: Ivin Poot, MD;  Location: [facility] OR;  Service: Thoracic;  Laterality: Right;  . Removal of pleural drainage catheter Left 03/31/2015    Procedure: REMOVAL OF  PLEURAL DRAINAGE CATHETER;  Surgeon: Ivin Poot, MD;  Location: [place];  Service: Thoracic;  Laterality: Left;    REVIEW OF SYSTEMS:  Constitutional: negative Eyes: negative Ears, nose, mouth, throat, and face: negative Respiratory: positive for cough Cardiovascular: negative Gastrointestinal: negative Genitourinary:negative Integument/breast: negative Hematologic/lymphatic: negative Musculoskeletal:positive for myalgias, negative for bone pain Neurological: negative Behavioral/Psych: negative Endocrine: negative Allergic/Immunologic: negative   PHYSICAL EXAMINATION: General appearance: alert, cooperative, fatigued and no distress Head: Normocephalic, without obvious abnormality, atraumatic Neck: no adenopathy, no JVD, supple, symmetrical, trachea midline and thyroid not enlarged, symmetric, no tenderness/mass/nodules Lymph nodes: Cervical, supraclavicular, and axillary nodes normal. Resp: clear to auscultation bilaterally Back: symmetric, no curvature. ROM normal. No CVA tenderness. Cardio: regular rate and rhythm, S1, S2 normal, no murmur, click, rub or gallop GI: soft, non-tender; bowel sounds normal; no masses,  no organomegaly Extremities: extremities normal,  atraumatic, no cyanosis or edema Neurologic: Alert and oriented X 3, normal strength and tone. Normal symmetric reflexes. Normal coordination and gait  ECOG PERFORMANCE STATUS: 1 - Symptomatic but completely ambulatory  Blood pressure 108/58, pulse 68, temperature 97.6 F (36.4 C), temperature source Oral, resp. rate 17, height '5\' 9"'$  (1.753 m), weight 165 lb 6.4 oz (75.025 kg), SpO2 100 %.  LABORATORY DATA: Lab Results  Component Value Date   WBC 5.1 02/21/2016   HGB 11.9* 02/21/2016   HCT 36.3* 02/21/2016   MCV 87.2 02/21/2016   PLT 106* 02/21/2016      Chemistry      Component Value Date/Time   NA 139 02/21/2016 0812   NA 139 02/13/2016 1405   K 4.4 02/21/2016 0812   K 5.1 02/13/2016 1405   CL 103 02/13/2016 1405   CO2 24 02/21/2016 0812   CO2 29 02/13/2016 1405   BUN 25.9 02/21/2016 0812   BUN 27* 02/13/2016 1405   CREATININE 1.1 02/21/2016 0812   CREATININE 1.11 02/13/2016 1405   CREATININE 0.83 02/22/2014 1314      Component Value Date/Time   CALCIUM 8.8 02/21/2016 0812   CALCIUM 9.2 02/13/2016 1405   ALKPHOS 64 02/21/2016 0812   ALKPHOS 57 02/13/2016 1405   AST 12 02/21/2016 0812   AST 16 02/13/2016 1405   ALT 10 02/21/2016 0812   ALT 12 02/13/2016 1405   BILITOT 0.53 02/21/2016 0812   BILITOT 0.5 02/13/2016 1405       RADIOGRAPHIC STUDIES: Ct Chest W Contrast  02/21/2016  CLINICAL DATA:  Stage IV adenocarcinoma of the left lung, restaging workup. Chemotherapy ended October 2016. Recurrent pneumonias. Hypertension. Hepatitis-C. EXAM: CT CHEST, ABDOMEN, AND PELVIS WITH CONTRAST TECHNIQUE: Multidetector CT imaging of the chest, abdomen and pelvis was performed following the standard protocol during bolus administration of intravenous contrast. CONTRAST:  15m OMNIPAQUE IOHEXOL 300 MG/ML  SOLN COMPARISON:  Multiple exams, including 12/11/2015 FINDINGS: CT CHEST FINDINGS Mediastinum/Nodes: Increase in size and number of scattered anterior mediastinal nodes, index  node 0.9 cm in short axis on image 13 series 2, formerly 0.4 cm. Right hilar node 1.3 cm in short axis on image 26 series 2, formerly 0.6 cm. Diffuse thickening of the middle and distal thirds of the esophagus. Coronary, aortic arch, and branch vessel atherosclerotic vascular disease. Right Port-A-Cath line tip: Right atrium Lungs/Pleura: Right pleural effusion has resolved. Trace left pleural fluid and pleural thickening with parenchymal volume loss in the left lower lobe especially superiorly, similar prior. In general the interstitial and airspace opacity in the lingula and left lower lobe has otherwise improved significantly since  12/11/2015. There is still associated airway thickening along with interstitial accentuation in this vicinity but it appears much improved. Several gas densities in the collapsed superior segment left lower lobe as on image 18 series 4 are less prominent than on the prior exam. This could reflect a cavitary process or a bulla surrounded by collapse lung. Emphysema. There is some indistinct ground-glass opacity dependently in the right lower lobe, probably attributable to atelectasis but warranting observation. Musculoskeletal: Degenerative glenohumeral arthropathy bilaterally. Thoracic spondylosis with bridging spurring. Degenerative bilateral sternoclavicular arthropathy. CT ABDOMEN PELVIS FINDINGS Hepatobiliary: Mixed density right hepatic lobe lesion 6.8 by 5.4 cm on image 49 series 2, formerly the same by my measurement, although with a slightly different pattern of thick banding inside the lesion surrounding generalized hypodensity. Dependent density in the gallbladder favoring sludge, possibly small gallstones. Pancreas: Unremarkable Spleen: Unremarkable Adrenals/Urinary Tract: Unremarkable Stomach/Bowel: Upper normal amount of stool in the colon. Vascular/Lymphatic: Aortoiliac atherosclerotic vascular disease. 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 Reproductive: Mildly prominent prostate gland. Other: The prior ascites has resolved. There still some trace presacral edema and some mild subcutaneous edema overlying the hips but the generalized subcutaneous edema has also otherwise resolved. Musculoskeletal: Small bilateral inguinal hernias contain adipose tissue. Mild lumbar spondylosis and degenerative disc disease, with a central disc protrusion at L2-3 which may be causing impingement. IMPRESSION: 1. Mild but increased right hilar adenopathy and mild anterior mediastinal adenopathy increased from prior. 2. On the other hand, the lingular and left lower lobe airspace opacities and confluent interstitial opacities have significantly improved in the interim. There is some continued volume loss and probable mild cavitation in the superior segment left lower lobe and there continues to be some interstitial accentuation especially in the left lower lobe, but this has improved significantly. 3. Stable size of mixed density right hepatic lobe lesion. 4. Prior third spacing of fluid has significantly improved with resolution of the prior ascites and only trace residual presacral edema and subcutaneous edema overlying the hips. 5. Other imaging findings of potential clinical significance: Emphysema. Thoracolumbar spondylosis with a central disc protrusion at L2-3. Upper normal amount of stool in the colon. 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 Mildly prominent prostate gland. Electronically Signed   By: Van Clines M.D.   On: 02/21/2016 09:50   Ct Abdomen Pelvis W Contrast  02/21/2016  CLINICAL DATA:  Stage IV adenocarcinoma of the left lung, restaging workup. Chemotherapy ended October 2016. Recurrent pneumonias. Hypertension. Hepatitis-C. EXAM: CT CHEST, ABDOMEN, AND PELVIS WITH CONTRAST TECHNIQUE: Multidetector CT imaging of the chest, abdomen and pelvis was performed following the standard protocol during bolus  administration of intravenous contrast. CONTRAST:  150m OMNIPAQUE IOHEXOL 300 MG/ML  SOLN COMPARISON:  Multiple exams, including 12/11/2015 FINDINGS: CT CHEST FINDINGS Mediastinum/Nodes: Increase in size and number of scattered anterior mediastinal nodes, index node 0.9 cm in short axis on image 13 series 2, formerly 0.4 cm. Right hilar node 1.3 cm in short axis on image 26 series 2, formerly 0.6 cm. Diffuse thickening of the middle and distal thirds of the esophagus. Coronary, aortic arch, and branch vessel atherosclerotic vascular disease. Right Port-A-Cath line tip: Right atrium Lungs/Pleura: Right pleural effusion has resolved. Trace left pleural fluid and pleural thickening with parenchymal volume loss in the left lower lobe especially superiorly, similar prior. In general the interstitial and airspace opacity in the lingula and left lower lobe has otherwise improved significantly since 12/11/2015. There is still  associated airway thickening along with interstitial accentuation in this vicinity but it appears much improved. Several gas densities in the collapsed superior segment left lower lobe as on image 18 series 4 are less prominent than on the prior exam. This could reflect a cavitary process or a bulla surrounded by collapse lung. Emphysema. There is some indistinct ground-glass opacity dependently in the right lower lobe, probably attributable to atelectasis but warranting observation. Musculoskeletal: Degenerative glenohumeral arthropathy bilaterally. Thoracic spondylosis with bridging spurring. Degenerative bilateral sternoclavicular arthropathy. CT ABDOMEN PELVIS FINDINGS Hepatobiliary: Mixed density right hepatic lobe lesion 6.8 by 5.4 cm on image 49 series 2, formerly the same by my measurement, although with a slightly different pattern of thick banding inside the lesion surrounding generalized hypodensity. Dependent density in the gallbladder favoring sludge, possibly small gallstones. Pancreas:  Unremarkable Spleen: Unremarkable Adrenals/Urinary Tract: Unremarkable Stomach/Bowel: Upper normal amount of stool in the colon. Vascular/Lymphatic: Aortoiliac atherosclerotic vascular disease. 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 Reproductive: Mildly prominent prostate gland. Other: The prior ascites has resolved. There still some trace presacral edema and some mild subcutaneous edema overlying the hips but the generalized subcutaneous edema has also otherwise resolved. Musculoskeletal: Small bilateral inguinal hernias contain adipose tissue. Mild lumbar spondylosis and degenerative disc disease, with a central disc protrusion at L2-3 which may be causing impingement. IMPRESSION: 1. Mild but increased right hilar adenopathy and mild anterior mediastinal adenopathy increased from prior. 2. On the other hand, the lingular and left lower lobe airspace opacities and confluent interstitial opacities have significantly improved in the interim. There is some continued volume loss and probable mild cavitation in the superior segment left lower lobe and there continues to be some interstitial accentuation especially in the left lower lobe, but this has improved significantly. 3. Stable size of mixed density right hepatic lobe lesion. 4. Prior third spacing of fluid has significantly improved with resolution of the prior ascites and only trace residual presacral edema and subcutaneous edema overlying the hips. 5. Other imaging findings of potential clinical significance: Emphysema. Thoracolumbar spondylosis with a central disc protrusion at L2-3. Upper normal amount of stool in the colon. 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 Mildly prominent prostate gland. Electronically Signed   By: Van Clines M.D.   On: 02/21/2016 09:50   ASSESSMENT AND PLAN: this is a very pleasant 75 year old white male with:  1) Stage IV non-small cell lung cancer,  adenocarcinoma presenting with large right upper lobe lung mass in addition to mediastinal and bilateral hilar lymphadenopathy as well as cervical lymphadenopathy and left pleural effusion. The patient completed a course systemic chemotherapy with carboplatin and Alimta status post 6 cycles. This was followed by 3 cycles of maintenance chemotherapy with single agent Alimta. The patient tolerated his treatment well. He had evidence for disease progression and the patient was started on treatment again with carboplatin and Alimta status post 6 cycles. He was treated with 4 cycles of immunotherapy with Nivolumab discontinued secondary to disease progression. The patient the received systemic chemotherapy with docetaxel and Cyramza status post 6 cycles and tolerated the treatment well except for fatigue and feels admission with recurrent pneumonia.   The patient was seen and discussed with Dr. [name]. His recent CT scan of the chest showed slight increase in the size of several lymph nodes. Scan results were discussed with the patient and his wife. Images reviewed. Treatment options reviewed including continued observation with a repeat CT scan  in 2 months vs. Referral to Radiation Oncology for radiation to the lymph nodes in the chest. Since there is only slight increase in the size of the lymph nodes, the patient opted to proceed with continued observation. The patient will be seen back for follow-up visit in 2 months for reevaluation after repeating CT scan of the chest, abdomen and pelvis for restaging of his disease.  2) Atrial fibrillation: continue Eliquis.  3) lower extremity edema: Improved. Continue Demadex.  4) CODE STATUS: Discussed previously with the patient and his wife. He would like initial resuscitation but no prolonged support if in a persistent vegetative state.  He was advised to call immediately if he has any concerning symptoms in the interval.  The patient voices understanding of  current disease status and treatment options and is in agreement with the current care plan.  All questions were answered. The patient knows to call the clinic with any problems, questions or concerns. We can certainly see the patient much sooner if necessary.  Mikey Bussing, DNP, AGPCNP-BC, AOCNP  ADDENDUM: Hematology/Oncology Attending: I had a face to face encounter with the patient today. I recommended his care plan. This is a very pleasant 75 years old white male with stage IV non-small cell lung cancer, adenocarcinoma status post several chemotherapy regimens and lately completed a course of docetaxel and surrounds the last dose was given 11/21/2015 with stable disease. The patient has been on observation for the last few months. He has significant improvement in his condition and he lost significant amount of weight secondary to diuresis. He looks much better and very active. He denied having any significant complaints today. The patient had repeat CT scan of the chest, abdomen and pelvis performed recently and his here for evaluation and discussion of his scan results. The scan showed stable disease except for slightly enlarged mediastinal lymph nodes. I discussed the scan results with the patient and his wife and showed them the images. I gave him the option of continuous observation and close monitoring since the patient is doing fine today versus consideration of referral to radiation oncology for palliative radiotherapy to the mediastinum. After discussion of the 2 options, the patient his wife would like to continue on observation for now. I would see him back for follow-up visit in 2 months for evaluation and repeat CT scan of the chest, abdomen and pelvis for restaging of his disease. The patient was advised to call immediately if he has any concerning symptoms in the interval.   Disclaimer: This note was dictated with voice recognition software. Similar sounding words can  inadvertently be transcribed and may be missed upon review.   Eilleen Kempf., MD 02/26/2016

## 2016-02-27 ENCOUNTER — Other Ambulatory Visit: Payer: Medicare Other

## 2016-03-06 ENCOUNTER — Encounter: Payer: Self-pay | Admitting: Family Medicine

## 2016-03-06 ENCOUNTER — Ambulatory Visit (INDEPENDENT_AMBULATORY_CARE_PROVIDER_SITE_OTHER): Payer: Medicare Other | Admitting: Family Medicine

## 2016-03-06 VITALS — BP 116/70 | HR 72 | Temp 97.6°F | Wt 158.2 lb

## 2016-03-06 DIAGNOSIS — E099 Drug or chemical induced diabetes mellitus without complications: Secondary | ICD-10-CM | POA: Diagnosis not present

## 2016-03-06 DIAGNOSIS — R21 Rash and other nonspecific skin eruption: Secondary | ICD-10-CM

## 2016-03-06 DIAGNOSIS — T380X5A Adverse effect of glucocorticoids and synthetic analogues, initial encounter: Secondary | ICD-10-CM | POA: Diagnosis not present

## 2016-03-06 DIAGNOSIS — J988 Other specified respiratory disorders: Secondary | ICD-10-CM

## 2016-03-06 DIAGNOSIS — C3492 Malignant neoplasm of unspecified part of left bronchus or lung: Secondary | ICD-10-CM

## 2016-03-06 DIAGNOSIS — J22 Unspecified acute lower respiratory infection: Secondary | ICD-10-CM

## 2016-03-06 DIAGNOSIS — R609 Edema, unspecified: Secondary | ICD-10-CM

## 2016-03-06 DIAGNOSIS — I480 Paroxysmal atrial fibrillation: Secondary | ICD-10-CM

## 2016-03-06 MED ORDER — TRIAMCINOLONE ACETONIDE 0.1 % EX CREA: 1.0000 "application " | TOPICAL_CREAM | Freq: Two times a day (BID) | CUTANEOUS | Status: DC

## 2016-03-06 MED ORDER — AMOXICILLIN 500 MG PO CAPS: 500.0000 mg | ORAL_CAPSULE | Freq: Three times a day (TID) | ORAL | Status: DC

## 2016-03-06 MED ORDER — BENZONATATE 100 MG PO CAPS: 100.0000 mg | ORAL_CAPSULE | Freq: Two times a day (BID) | ORAL | Status: DC | PRN

## 2016-03-06 NOTE — Patient Instructions (Addendum)
EKG today.  Try tessalon perls for cough and update [place] with effect (sent to pharmacy). Treat cough with amoxicillin '500mg'$  three times daily for 1 week.  Return in 2 months for follow up visit.

## 2016-03-06 NOTE — Progress Notes (Signed)
BP 116/70 mmHg  Pulse 72  Temp(Src) 97.6 F (36.4 C) (Oral)  Wt 158 lb 4 oz (71.782 kg)  SpO2 97%   CC: 1 mo f/u visit  Subjective:    Patient ID: Shawn Espinoza, male    DOB: [birth year], 75 y.o.   MRN: [id number]  HPI: GRANVILLE WHITEFIELD is a 75 y.o. male presenting on 03/06/2016 for Follow-up and Cough   See prior note for details. 7lb weight loss noted. Appetite ok. 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 Continues on chemo break. F/u CT stable. Has f/u appt with Mohammed in 2 months.   Continued itching uses desitin, not on topical steroid.   6d h/o productive cough of pale yellow mucous. Worse early in the morning. Head and chest congestion. No fevers/chills. Hasn't tried anything other than robitussin.   Worsening muscle aches - noticed more with increased working out. He may have overdone workout recently. Takes tramadol nightly. Wife asks about tylenol use.  Relevant past medical, surgical, family and social history reviewed and updated as indicated. Interim medical history since our last visit reviewed. Allergies and medications reviewed and updated. Current Outpatient Prescriptions on File Prior to Visit  Medication Sig  . albuterol (PROVENTIL HFA;VENTOLIN HFA) 108 (90 BASE) MCG/ACT inhaler Inhale 2 puffs into the lungs every 6 (six) hours as needed for wheezing or shortness of breath.  . Alum & Mag Hydroxide-Simeth (MAGIC MOUTHWASH W/LIDOCAINE) SOLN Take 5 mLs by mouth 3 (three) times daily as needed for mouth pain.  Marland Kitchen apixaban (ELIQUIS) 5 MG TABS tablet Take 1 tablet (5 mg total) by mouth 2 (two) times daily. Restart when platelets >50.  Marland Kitchen dexamethasone (DECADRON) 4 MG tablet 2 tab po bid, the day before, day of and day after chemotherapy every 3 weeks  . diphenoxylate-atropine (LOMOTIL) 2.5-0.025 MG per tablet Take 2 tablets by mouth 4 (four) times daily as needed for diarrhea or loose stools.  Marland Kitchen glipiZIDE (GLUCOTROL) 5 MG tablet Take 1 tablet  (5 mg total) by mouth as directed. Take one daily when taking steroid (dexamethasone).  Marland Kitchen guaiFENesin (MUCINEX) 600 MG 12 hr tablet Take 600 mg by mouth 2 (two) times daily as needed for cough or to loosen phlegm.   . hydrocortisone (ANUSOL-HC) 25 MG suppository Place 1 suppository (25 mg total) rectally 2 (two) times daily.  Marland Kitchen KLOR-CON M10 10 MEQ tablet Take 20 mEq by mouth daily as needed (with torsemide).   Marland Kitchen lidocaine-prilocaine (EMLA) cream Apply 1 application topically as needed.  . loratadine (CLARITIN) 10 MG tablet Take 10 mg by mouth as directed. Reported on 01/05/2016  . metoprolol tartrate (LOPRESSOR) 25 MG tablet TAKE ONE TABLET BY MOUTH TWICE DAILY  . Multiple Vitamins-Minerals (ICAPS) CAPS Take 1 capsule by mouth 2 (two) times daily.   . polyethylene glycol (MIRALAX / GLYCOLAX) packet Take 8.5 g by mouth daily as needed for moderate constipation.   Marland Kitchen spironolactone (ALDACTONE) 50 MG tablet Take 1 tablet (50 mg total) by mouth daily.  . sucralfate (CARAFATE) 1 g tablet Take 1 tablet (1 g total) by mouth 3 (three) times daily before meals.  . torsemide (DEMADEX) 20 MG tablet Take 20 mg by mouth 2 (two) times daily. Takes one tablet daily and if swelling continues he takes a second pill  . traMADol (ULTRAM) 50 MG tablet Take 1 tablet (50 mg total) by mouth every 6 (six) hours as needed.   No current facility-administered medications on file prior to visit.  Review of Systems Per HPI unless specifically indicated in ROS section     Objective:    BP 116/70 mmHg  Pulse 72  Temp(Src) 97.6 F (36.4 C) (Oral)  Wt 158 lb 4 oz (71.782 kg)  SpO2 97%  Wt Readings from Last 3 Encounters:  03/06/16 158 lb 4 oz (71.782 kg)  02/26/16 165 lb 6.4 oz (75.025 kg)  02/14/16 164 lb (74.39 kg)   Body mass index is 23.36 kg/(m^2).  Physical Exam  Constitutional: He appears well-developed and well-nourished. No distress.  HENT:  Head: Normocephalic and atraumatic.  Right Ear: Hearing,  tympanic membrane, external ear and ear canal normal.  Left Ear: Hearing, tympanic membrane, external ear and ear canal normal.  Nose: Nose normal. No mucosal edema or rhinorrhea. Right sinus exhibits no maxillary sinus tenderness and no frontal sinus tenderness. Left sinus exhibits no maxillary sinus tenderness and no frontal sinus tenderness.  Mouth/Throat: Uvula is midline, oropharynx is clear and moist and mucous membranes are normal. No oropharyngeal exudate, posterior oropharyngeal edema, posterior oropharyngeal erythema or tonsillar abscesses.  Eyes: Conjunctivae and EOM are normal. Pupils are equal, round, and reactive to light. No scleral icterus.  Neck: Normal range of motion. Neck supple.  Cardiovascular: Normal rate, regular rhythm, normal heart sounds and intact distal pulses.   No murmur heard. Sounds regular  Pulmonary/Chest: Effort normal and breath sounds normal. No respiratory distress. He has no wheezes. He has no rales.  Rattling cough present Lungs clear to auscultation today  Musculoskeletal: He exhibits edema (woody BLE with mild erythema).  Lymphadenopathy:    He has no cervical adenopathy.  Skin: Skin is warm and dry. No rash noted.  Psychiatric: He has a normal mood and affect.  Nursing note and vitals reviewed.  Results for orders placed or performed in visit on 02/21/16  CBC with Differential  Result Value Ref Range   WBC 5.1 4.0 - 10.3 10e3/uL   NEUT# 3.5 1.5 - 6.5 10e3/uL   HGB 11.9 (L) 13.0 - 17.1 g/dL   HCT 36.3 (L) 38.4 - 49.9 %   Platelets 106 (L) 140 - 400 10e3/uL   MCV 87.2 79.3 - 98.0 fL   MCH 28.5 27.2 - 33.4 pg   MCHC 32.7 32.0 - 36.0 g/dL   RBC 4.16 (L) 4.20 - 5.82 10e6/uL   RDW 17.0 (H) 11.0 - 14.6 %   lymph# 0.8 (L) 0.9 - 3.3 10e3/uL   MONO# 0.5 0.1 - 0.9 10e3/uL   Eosinophils Absolute 0.2 0.0 - 0.5 10e3/uL   Basophils Absolute 0.1 0.0 - 0.1 10e3/uL   NEUT% 69.9 39.0 - 75.0 %   LYMPH% 15.4 14.0 - 49.0 %   MONO% 9.4 0.0 - 14.0 %   EOS%  4.3 0.0 - 7.0 %   BASO% 1.0 0.0 - 2.0 %  Comprehensive metabolic panel  Result Value Ref Range   Sodium 139 136 - 145 mEq/L   Potassium 4.4 3.5 - 5.1 mEq/L   Chloride 105 98 - 109 mEq/L   CO2 24 22 - 29 mEq/L   Glucose 157 (H) 70 - 140 mg/dl   BUN 25.9 7.0 - 26.0 mg/dL   Creatinine 1.1 0.7 - 1.3 mg/dL   Total Bilirubin 0.53 0.20 - 1.20 mg/dL   Alkaline Phosphatase 64 40 - 150 U/L   AST 12 5 - 34 U/L   ALT 10 0 - 55 U/L   Total Protein 6.2 (L) 6.4 - 8.3 g/dL   Albumin 3.3 (L) 3.5 -  5.0 g/dL   Calcium 8.8 8.4 - 10.4 mg/dL   Anion Gap 10 3 - 11 mEq/L   EGFR 67 (L) >90 ml/min/1.73 m2   *Note: Due to a large number of results and/or encounters for the requested time period, some results have not been displayed. A complete set of results can be found in Results Review.   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 Not currently taking glipizide.       Skin rash    Overall improved off chemotherapy but persists. Treat with TCI cream and continued desitin barrier cream use.      Peripheral edema    Stable period. Improved off chemo. Add TCI cream PRN for skin rash.  Continue torsemide 38m with second dose PRN swelling      Paroxysmal a-fib ([facility])    H/o recurrent afib 01/2015 and 10/2015 by EKG.  Continue eliquis.  Update EKG today - sounds regular today.  EKG NSR rate 60, normal axis, intervals, no acute ST/T changes, good r wave progression.      Relevant Orders   EKG 12-Lead (Completed)   Adenocarcinoma of left lung, stage 4 ([place]    Appreciate onc care. Currently on chemotherapy holiday since 10/2015, latest CT overall stable (mild enlarged LAD).       Relevant Medications   acetaminophen (TYLENOL) 500 MG tablet   Acute respiratory infection - Primary    Nontoxic on exam today. Treat aggressively  given comorbidities with amoxicillin 1 wk course. Treat cough with tessalon perls. Update if not improving with treatment.          Follow up plan: Return in about 3 months (around 06/06/2016) for follow up visit.

## 2016-03-06 NOTE — Assessment & Plan Note (Signed)
Appreciate onc care. Currently on chemotherapy holiday since 10/2015, latest CT overall stable (mild enlarged LAD).

## 2016-03-06 NOTE — Assessment & Plan Note (Signed)
Nontoxic on exam today. Treat aggressively given comorbidities with amoxicillin 1 wk course. Treat cough with tessalon perls. Update if not improving with treatment.

## 2016-03-06 NOTE — Assessment & Plan Note (Addendum)
Stable period. Improved off chemo. Add TCI cream PRN for skin rash.  Continue torsemide '20mg'$  with second dose PRN swelling

## 2016-03-06 NOTE — Progress Notes (Signed)
Pre visit review using our clinic review tool, if applicable. No additional management support is needed unless otherwise documented below in the visit note. 

## 2016-03-06 NOTE — Assessment & Plan Note (Signed)
No recent steroids as no recent chemo. Not currently taking glipizide.

## 2016-03-06 NOTE — Assessment & Plan Note (Signed)
H/o recurrent afib 01/2015 and 10/2015 by EKG.  Continue eliquis.  Update EKG today - sounds regular today.  EKG NSR rate 60, normal axis, intervals, no acute ST/T changes, good r wave progression.

## 2016-03-06 NOTE — Assessment & Plan Note (Signed)
Overall improved off chemotherapy but persists. Treat with TCI cream and continued desitin barrier cream use.

## 2016-03-07 ENCOUNTER — Ambulatory Visit (HOSPITAL_COMMUNITY): Payer: Medicare Other

## 2016-03-07 ENCOUNTER — Other Ambulatory Visit: Payer: Medicare Other

## 2016-03-13 ENCOUNTER — Ambulatory Visit: Payer: Medicare Other | Admitting: Internal Medicine

## 2016-03-19 ENCOUNTER — Ambulatory Visit: Payer: Medicare Other | Admitting: Family Medicine

## 2016-03-20 ENCOUNTER — Encounter: Payer: Self-pay | Admitting: Cardiothoracic Surgery

## 2016-03-20 ENCOUNTER — Ambulatory Visit (INDEPENDENT_AMBULATORY_CARE_PROVIDER_SITE_OTHER): Payer: Medicare Other | Admitting: Cardiothoracic Surgery

## 2016-03-20 VITALS — BP 117/77 | HR 70 | Resp 20 | Ht 69.0 in | Wt 151.0 lb

## 2016-03-20 DIAGNOSIS — C3492 Malignant neoplasm of unspecified part of left bronchus or lung: Secondary | ICD-10-CM

## 2016-03-20 DIAGNOSIS — J910 Malignant pleural effusion: Secondary | ICD-10-CM | POA: Diagnosis not present

## 2016-03-20 DIAGNOSIS — Z95828 Presence of other vascular implants and grafts: Secondary | ICD-10-CM

## 2016-03-20 NOTE — Progress Notes (Signed)
PCP is [name], MD Referring Provider is Curt Bears, MD  Chief Complaint  Patient presents with  . Pleural Effusion    4 week f/u   history of stage IV adenocarcinoma of left lung, clinically controlled  HPI: Patient returns for followup of his advanced adenocarcinoma left lung. The right Port-A-Cath is functional use for CT scan performed earlier this month. The CT scan showed some scarring at the left base and some mild mediastinal adenopathy but no evidence of definite progression of his disease. Overall he is doing well and is going to the [facility] and starting to hit golf balls. His weight is stable. He denies any pain or swelling around the Port-A-Cath reservoir.  Past Medical History  Diagnosis Date  . History of diabetes mellitus, type II     resolved with diet, h/o neuropathy  . Diverticulosis of colon   . Hyperlipidemia   . Hypertension   . Fatty liver 02/29/00    abd ultrasound  . Lower back pain   . Systolic murmur 0109    2Decho - normal LV fxn, EF 55%, mild AS, biatrial enlargement  . History of tobacco use quit 1990s  . 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 Personal history of colonic adenomas 07/06/2013  . Hypertensive retinopathy of both eyes 2015    mild  . Dysrhythmia     Atrial fib (found 07/2014)  . Shortness of breath   . Pneumonia   . GERD (gastroesophageal reflux disease)     occasional  . Arthritis   . Neuropathy ([place])   . Constipation   . 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 Diabetes mellitus type 2 with retinopathy ([place])   . Depression   . 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 Full code status 09/26/2015  . Atrial fibrillation ([place])   . Lobar pneumonia ([place]) 10/18/2015  . HCAP (healthcare-associated pneumonia) 11/27/2015    IV vanc/zosyn then  levaquin courses x2     Past Surgical History  Procedure Laterality Date  . Colonoscopy  2004  . Colonoscopy  2014    tubular adenoma x1, mod diverticulosis ([place])  . Flexible bronchoscopy  02/2014    WNL  . Video bronchoscopy Bilateral 03/17/2014    Procedure: VIDEO BRONCHOSCOPY WITH FLUORO;  Surgeon: Tanda Rockers, MD;  Location: [facility];  Service: Cardiopulmonary;  Laterality: Bilateral;  . Chest tube insertion Left 08/26/2014    Procedure: INSERTION OF LEFT  PLEURAL DRAINAGE CATHETER;  Surgeon: Ivin Poot, MD;  Location: [facility];  Service: Thoracic;  Laterality: Left;  . Cataract extraction Right 03/2015    Dr [name]  . Portacath placement Right 03/31/2015    Procedure: INSERTION PORT-A-CATH;  Surgeon: Ivin Poot, MD;  Location: [facility] OR;  Service: Thoracic;  Laterality: Right;  . Removal of pleural drainage catheter Left 03/31/2015    Procedure: REMOVAL OF PLEURAL DRAINAGE CATHETER;  Surgeon: Ivin Poot, MD;  Location: [facility] OR;  Service: Thoracic;  Laterality: Left;    Family History  Problem Relation Age of Onset  . Stroke Mother     multiple mini strokes  . Hypertension Mother   . Heart disease Maternal Grandfather     MI  . Stroke Paternal Grandmother   . Colon cancer  Neg Hx     Social History Social History  Substance Use Topics  . Smoking status: Former Smoker -- 3.00 packs/day for 30 years    Types: Cigarettes    Quit date: 12/23/1988  . Smokeless tobacco: Never Used  . Alcohol Use: 12.6 oz/week    21 Glasses of wine per week     Comment: 3-4 wine a day ( 8/03- 2 beers, 2 wines, 2 brandies daily)    Current Outpatient Prescriptions  Medication Sig Dispense Refill  . acetaminophen (TYLENOL) 500 MG tablet Take 500 mg by mouth 2 (two) times daily.    Marland Kitchen albuterol (PROVENTIL HFA;VENTOLIN HFA) 108 (90 BASE) MCG/ACT inhaler Inhale 2 puffs into the lungs every 6 (six) hours as needed for wheezing or shortness of breath. 1 Inhaler 3  . Alum & Mag  Hydroxide-Simeth (MAGIC MOUTHWASH W/LIDOCAINE) SOLN Take 5 mLs by mouth 3 (three) times daily as needed for mouth pain. 5 mL 0  . amoxicillin (AMOXIL) 500 MG capsule Take 1 capsule (500 mg total) by mouth 3 (three) times daily. 21 capsule 0  . apixaban (ELIQUIS) 5 MG TABS tablet Take 1 tablet (5 mg total) by mouth 2 (two) times daily. Restart when platelets >50.    . benzonatate (TESSALON) 100 MG capsule Take 1 capsule (100 mg total) by mouth 2 (two) times daily as needed for cough. 30 capsule 0  . dexamethasone (DECADRON) 4 MG tablet 2 tab po bid, the day before, day of and day after chemotherapy every 3 weeks 40 tablet 1  . diphenoxylate-atropine (LOMOTIL) 2.5-0.025 MG per tablet Take 2 tablets by mouth 4 (four) times daily as needed for diarrhea or loose stools. 30 tablet 0  . glipiZIDE (GLUCOTROL) 5 MG tablet Take 1 tablet (5 mg total) by mouth as directed. Take one daily when taking steroid (dexamethasone).    Marland Kitchen guaiFENesin (MUCINEX) 600 MG 12 hr tablet Take 600 mg by mouth 2 (two) times daily as needed for cough or to loosen phlegm.     . hydrocortisone (ANUSOL-HC) 25 MG suppository Place 1 suppository (25 mg total) rectally 2 (two) times daily. 12 suppository 0  . KLOR-CON M10 10 MEQ tablet Take 20 mEq by mouth daily as needed (with torsemide).   10  . lidocaine-prilocaine (EMLA) cream Apply 1 application topically as needed. 30 g 1  . loratadine (CLARITIN) 10 MG tablet Take 10 mg by mouth as directed. Reported on 01/05/2016    . metoprolol tartrate (LOPRESSOR) 25 MG tablet TAKE ONE TABLET BY MOUTH TWICE DAILY 60 tablet 2  . Multiple Vitamins-Minerals (ICAPS) CAPS Take 1 capsule by mouth 2 (two) times daily.     . polyethylene glycol (MIRALAX / GLYCOLAX) packet Take 8.5 g by mouth daily as needed for moderate constipation.     Marland Kitchen spironolactone (ALDACTONE) 50 MG tablet Take 1 tablet (50 mg total) by mouth daily. 30 tablet 3  . sucralfate (CARAFATE) 1 g tablet Take 1 tablet (1 g total) by mouth 3  (three) times daily before meals. 90 tablet 1  . torsemide (DEMADEX) 20 MG tablet Take 20 mg by mouth 2 (two) times daily. Takes one tablet daily and if swelling continues he takes a second pill    . traMADol (ULTRAM) 50 MG tablet Take 1 tablet (50 mg total) by mouth every 6 (six) hours as needed. 30 tablet 3  . triamcinolone cream (KENALOG) 0.1 % Apply 1 application topically 2 (two) times daily. Apply to AA. 60 g 0  No current facility-administered medications for this visit.    Allergies  Allergen Reactions  . Candesartan Cilexetil     REACTION: Muscle spasms  . Diltiazem Hcl     REACTION: Dizziness  . Doxazosin Mesylate Other (See Comments)    REACTION: H/A's  . Nifedipine     REACTION: Leg swelling  . Pravastatin Other (See Comments)    Muscle aches  . Red Yeast Rice     Muscle aches  . Rosuvastatin Other (See Comments)    REACTION: questionable: severe constipation  . Sertraline Other (See Comments)    oversedation  . Simvastatin Other (See Comments)    REACTION: Muscle aches  . Hydrocodone Other (See Comments)    constipation    Review of Systems  No fever Recent symptoms of upper respiratory infection resolved No swelling No hemoptysis For chest pain  BP 117/77 mmHg  Pulse 70  Resp 20  Ht '5\' 9"'$  (1.753 m)  Wt 151 lb (68.493 kg)  BMI 22.29 kg/m2  SpO2 96% Physical Exam Alert and comfortable No palpable nodes in the neck Heart rate regular without gallop or murmur Lungs clear Right chest Port-A-Cath reservoir nontender, intact  Diagnostic Tests: CT scan performed March 1 personal reviewed showing some scarring at the left lung base, minimally enlarged mediastinal nodes, no evidence of progression of disease.  Impression: Excellent response to previous chemotherapy under the direction of Dr. [name] to stage IV adenocarcinoma lung  Plan:continue to follow his status with visit in 3 months.   Len Childs, MD [facility] [phone]

## 2016-03-26 DIAGNOSIS — H5310 Unspecified subjective visual disturbances: Secondary | ICD-10-CM | POA: Diagnosis not present

## 2016-03-26 DIAGNOSIS — H353130 Nonexudative age-related macular degeneration, bilateral, stage unspecified: Secondary | ICD-10-CM | POA: Diagnosis not present

## 2016-03-26 DIAGNOSIS — H04123 Dry eye syndrome of bilateral lacrimal glands: Secondary | ICD-10-CM | POA: Diagnosis not present

## 2016-03-26 DIAGNOSIS — H26491 Other secondary cataract, right eye: Secondary | ICD-10-CM | POA: Diagnosis not present

## 2016-03-26 DIAGNOSIS — G453 Amaurosis fugax: Secondary | ICD-10-CM | POA: Diagnosis not present

## 2016-03-26 LAB — HM DIABETES EYE EXAM

## 2016-04-03 ENCOUNTER — Telehealth: Payer: Self-pay | Admitting: Family Medicine

## 2016-04-03 DIAGNOSIS — G453 Amaurosis fugax: Secondary | ICD-10-CM

## 2016-04-03 DIAGNOSIS — C3492 Malignant neoplasm of unspecified part of left bronchus or lung: Secondary | ICD-10-CM

## 2016-04-03 NOTE — Telephone Encounter (Signed)
LM for pt to sch AVW, preferably during the week of May 8-12, 2017., [place]

## 2016-04-05 DIAGNOSIS — H26491 Other secondary cataract, right eye: Secondary | ICD-10-CM | POA: Diagnosis not present

## 2016-04-13 ENCOUNTER — Encounter: Payer: Self-pay | Admitting: Family Medicine

## 2016-04-13 NOTE — Telephone Encounter (Signed)
Received request from Dr Kathlen Mody ophthalmologist to schedule imaging for amaurosis fugax (03/26/2016) Ordered MRI, carotid US, echo.  Ophtho OV sent to scan, placed Rx in Kim's box.

## 2016-04-18 NOTE — Telephone Encounter (Signed)
Appts made and patients wife aware.

## 2016-04-19 ENCOUNTER — Ambulatory Visit (HOSPITAL_COMMUNITY): Payer: Medicare Other

## 2016-04-22 ENCOUNTER — Other Ambulatory Visit: Payer: Self-pay

## 2016-04-22 DIAGNOSIS — Z95828 Presence of other vascular implants and grafts: Secondary | ICD-10-CM | POA: Insufficient documentation

## 2016-04-23 ENCOUNTER — Ambulatory Visit: Payer: Medicare Other

## 2016-04-23 ENCOUNTER — Encounter (HOSPITAL_COMMUNITY): Payer: Self-pay

## 2016-04-23 ENCOUNTER — Other Ambulatory Visit (HOSPITAL_BASED_OUTPATIENT_CLINIC_OR_DEPARTMENT_OTHER): Payer: Medicare Other

## 2016-04-23 ENCOUNTER — Ambulatory Visit (HOSPITAL_COMMUNITY)
Admission: RE | Admit: 2016-04-23 | Discharge: 2016-04-23 | Disposition: A | Discharge status: Home / Self Care | Payer: Medicare Other | Source: Ambulatory Visit | Attending: Oncology | Admitting: Oncology

## 2016-04-23 DIAGNOSIS — Z86010 Personal history of colonic polyps: Secondary | ICD-10-CM

## 2016-04-23 DIAGNOSIS — M791 Myalgia, unspecified site: Secondary | ICD-10-CM

## 2016-04-23 DIAGNOSIS — E860 Dehydration: Secondary | ICD-10-CM

## 2016-04-23 DIAGNOSIS — K769 Liver disease, unspecified: Secondary | ICD-10-CM

## 2016-04-23 DIAGNOSIS — Z789 Other specified health status: Secondary | ICD-10-CM

## 2016-04-23 DIAGNOSIS — C3492 Malignant neoplasm of unspecified part of left bronchus or lung: Secondary | ICD-10-CM | POA: Diagnosis not present

## 2016-04-23 DIAGNOSIS — Z95828 Presence of other vascular implants and grafts: Secondary | ICD-10-CM

## 2016-04-23 DIAGNOSIS — R530 Neoplastic (malignant) related fatigue: Secondary | ICD-10-CM

## 2016-04-23 DIAGNOSIS — M25561 Pain in right knee: Secondary | ICD-10-CM

## 2016-04-23 DIAGNOSIS — Z0000 Encounter for general adult medical examination without abnormal findings: Secondary | ICD-10-CM

## 2016-04-23 DIAGNOSIS — D751 Secondary polycythemia: Secondary | ICD-10-CM

## 2016-04-23 DIAGNOSIS — R011 Cardiac murmur, unspecified: Secondary | ICD-10-CM

## 2016-04-23 DIAGNOSIS — K209 Esophagitis, unspecified without bleeding: Secondary | ICD-10-CM

## 2016-04-23 DIAGNOSIS — E162 Hypoglycemia, unspecified: Secondary | ICD-10-CM

## 2016-04-23 DIAGNOSIS — D61818 Other pancytopenia: Secondary | ICD-10-CM

## 2016-04-23 DIAGNOSIS — J90 Pleural effusion, not elsewhere classified: Secondary | ICD-10-CM

## 2016-04-23 DIAGNOSIS — M79604 Pain in right leg: Secondary | ICD-10-CM

## 2016-04-23 DIAGNOSIS — D702 Other drug-induced agranulocytosis: Secondary | ICD-10-CM

## 2016-04-23 DIAGNOSIS — R590 Localized enlarged lymph nodes: Secondary | ICD-10-CM | POA: Insufficient documentation

## 2016-04-23 DIAGNOSIS — Z7901 Long term (current) use of anticoagulants: Secondary | ICD-10-CM

## 2016-04-23 DIAGNOSIS — R768 Other specified abnormal immunological findings in serum: Secondary | ICD-10-CM

## 2016-04-23 DIAGNOSIS — T451X5A Adverse effect of antineoplastic and immunosuppressive drugs, initial encounter: Secondary | ICD-10-CM

## 2016-04-23 DIAGNOSIS — B370 Candidal stomatitis: Secondary | ICD-10-CM

## 2016-04-23 DIAGNOSIS — R634 Abnormal weight loss: Secondary | ICD-10-CM

## 2016-04-23 DIAGNOSIS — M79605 Pain in left leg: Secondary | ICD-10-CM

## 2016-04-23 DIAGNOSIS — R160 Hepatomegaly, not elsewhere classified: Secondary | ICD-10-CM | POA: Insufficient documentation

## 2016-04-23 DIAGNOSIS — D6481 Anemia due to antineoplastic chemotherapy: Secondary | ICD-10-CM

## 2016-04-23 DIAGNOSIS — E785 Hyperlipidemia, unspecified: Secondary | ICD-10-CM

## 2016-04-23 DIAGNOSIS — I480 Paroxysmal atrial fibrillation: Secondary | ICD-10-CM

## 2016-04-23 DIAGNOSIS — D61810 Antineoplastic chemotherapy induced pancytopenia: Secondary | ICD-10-CM

## 2016-04-23 DIAGNOSIS — C3490 Malignant neoplasm of unspecified part of unspecified bronchus or lung: Secondary | ICD-10-CM | POA: Diagnosis not present

## 2016-04-23 DIAGNOSIS — Z7189 Other specified counseling: Secondary | ICD-10-CM

## 2016-04-23 DIAGNOSIS — J22 Unspecified acute lower respiratory infection: Secondary | ICD-10-CM

## 2016-04-23 DIAGNOSIS — N400 Enlarged prostate without lower urinary tract symptoms: Secondary | ICD-10-CM

## 2016-04-23 DIAGNOSIS — D696 Thrombocytopenia, unspecified: Secondary | ICD-10-CM

## 2016-04-23 DIAGNOSIS — Z87891 Personal history of nicotine dependence: Secondary | ICD-10-CM

## 2016-04-23 DIAGNOSIS — R040 Epistaxis: Secondary | ICD-10-CM

## 2016-04-23 DIAGNOSIS — I951 Orthostatic hypotension: Secondary | ICD-10-CM

## 2016-04-23 DIAGNOSIS — E46 Unspecified protein-calorie malnutrition: Secondary | ICD-10-CM

## 2016-04-23 DIAGNOSIS — I350 Nonrheumatic aortic (valve) stenosis: Secondary | ICD-10-CM

## 2016-04-23 DIAGNOSIS — R0609 Other forms of dyspnea: Secondary | ICD-10-CM

## 2016-04-23 DIAGNOSIS — Z5111 Encounter for antineoplastic chemotherapy: Secondary | ICD-10-CM

## 2016-04-23 DIAGNOSIS — R609 Edema, unspecified: Secondary | ICD-10-CM

## 2016-04-23 DIAGNOSIS — I10 Essential (primary) hypertension: Secondary | ICD-10-CM

## 2016-04-23 DIAGNOSIS — R21 Rash and other nonspecific skin eruption: Secondary | ICD-10-CM

## 2016-04-23 DIAGNOSIS — T380X5A Adverse effect of glucocorticoids and synthetic analogues, initial encounter: Secondary | ICD-10-CM

## 2016-04-23 DIAGNOSIS — J8489 Other specified interstitial pulmonary diseases: Secondary | ICD-10-CM

## 2016-04-23 DIAGNOSIS — C787 Secondary malignant neoplasm of liver and intrahepatic bile duct: Secondary | ICD-10-CM | POA: Diagnosis not present

## 2016-04-23 DIAGNOSIS — E8809 Other disorders of plasma-protein metabolism, not elsewhere classified: Secondary | ICD-10-CM

## 2016-04-23 DIAGNOSIS — E099 Drug or chemical induced diabetes mellitus without complications: Secondary | ICD-10-CM

## 2016-04-23 LAB — COMPREHENSIVE METABOLIC PANEL
ALBUMIN: 3.7 g/dL (ref 3.5–5.0)
ALK PHOS: 73 U/L (ref 40–150)
ALT: 14 U/L (ref 0–55)
ANION GAP: 9 meq/L (ref 3–11)
AST: 15 U/L (ref 5–34)
BILIRUBIN TOTAL: 0.45 mg/dL (ref 0.20–1.20)
BUN: 34.6 mg/dL — ABNORMAL HIGH (ref 7.0–26.0)
CO2: 25 mEq/L (ref 22–29)
CREATININE: 1.3 mg/dL (ref 0.7–1.3)
Calcium: 9.4 mg/dL (ref 8.4–10.4)
Chloride: 104 mEq/L (ref 98–109)
EGFR: 53 mL/min/{1.73_m2} — AB (ref >90)
GLUCOSE: 146 mg/dL — AB (ref 70–140)
Potassium: 4.8 mEq/L (ref 3.5–5.1)
SODIUM: 138 meq/L (ref 136–145)
TOTAL PROTEIN: 6.8 g/dL (ref 6.4–8.3)

## 2016-04-23 LAB — CBC WITH DIFFERENTIAL/PLATELET
BASO%: 0.8 % (ref 0.0–2.0)
Basophils Absolute: 0 10*3/uL (ref 0.0–0.1)
EOS ABS: 0.2 10*3/uL (ref 0.0–0.5)
EOS%: 4.1 % (ref 0.0–7.0)
HCT: 38.1 % — ABNORMAL LOW (ref 38.4–49.9)
HEMOGLOBIN: 12.6 g/dL — AB (ref 13.0–17.1)
LYMPH%: 14.2 % (ref 14.0–49.0)
MCH: 29.5 pg (ref 27.2–33.4)
MCHC: 33 g/dL (ref 32.0–36.0)
MCV: 89.5 fL (ref 79.3–98.0)
MONO#: 0.5 10*3/uL (ref 0.1–0.9)
MONO%: 8.8 % (ref 0.0–14.0)
NEUT%: 72.1 % (ref 39.0–75.0)
NEUTROS ABS: 4.3 10*3/uL (ref 1.5–6.5)
PLATELETS: 143 10*3/uL (ref 140–400)
RBC: 4.26 10*6/uL (ref 4.20–5.82)
RDW: 16.4 % — AB (ref 11.0–14.6)
WBC: 5.9 10*3/uL (ref 4.0–10.3)
lymph#: 0.8 10*3/uL — ABNORMAL LOW (ref 0.9–3.3)

## 2016-04-23 MED ORDER — SODIUM CHLORIDE 0.9 % IJ SOLN
10.0000 mL | INTRAMUSCULAR | Status: AC | PRN
  Administered 2016-04-23: 10 mL
  Filled 2016-04-23: qty 10

## 2016-04-23 MED ORDER — IOPAMIDOL (ISOVUE-300) INJECTION 61%
100.0000 mL | Freq: Once | INTRAVENOUS | Status: AC | PRN
Start: 2016-04-23 — End: 2016-04-23
  Administered 2016-04-23: 100 mL via INTRAVENOUS

## 2016-04-23 NOTE — Patient Instructions (Signed)

## 2016-04-24 ENCOUNTER — Ambulatory Visit
Admission: RE | Admit: 2016-04-24 | Discharge: 2016-04-24 | Disposition: A | Discharge status: Home / Self Care | Payer: Medicare Other | Source: Ambulatory Visit | Attending: Family Medicine | Admitting: Family Medicine

## 2016-04-24 ENCOUNTER — Telehealth: Payer: Self-pay

## 2016-04-24 DIAGNOSIS — C3492 Malignant neoplasm of unspecified part of left bronchus or lung: Secondary | ICD-10-CM

## 2016-04-24 DIAGNOSIS — I639 Cerebral infarction, unspecified: Secondary | ICD-10-CM | POA: Diagnosis not present

## 2016-04-24 DIAGNOSIS — G453 Amaurosis fugax: Secondary | ICD-10-CM

## 2016-04-24 DIAGNOSIS — I480 Paroxysmal atrial fibrillation: Secondary | ICD-10-CM

## 2016-04-24 DIAGNOSIS — I63522 Cerebral infarction due to unspecified occlusion or stenosis of left anterior cerebral artery: Secondary | ICD-10-CM

## 2016-04-24 MED ORDER — GADOBENATE DIMEGLUMINE 529 MG/ML IV SOLN
14.0000 mL | Freq: Once | INTRAVENOUS | Status: AC | PRN
  Administered 2016-04-24: 14 mL via INTRAVENOUS

## 2016-04-24 NOTE — Telephone Encounter (Signed)
[facility] called report for MRI of brain. Dr Darnell Level is out of the office this afternoon. Spoke with Dr Damita Dunnings.

## 2016-04-24 NOTE — Telephone Encounter (Signed)
Spoke with Shawn Espinoza (DPR signed); Shawn Espinoza advised as instructed. Today pt feeling OK; cloudy vision has cleared,no slurred speech,no problem walking,(pt went to grocery store with Shawn Espinoza today and did well). On and off for months pt has had grip issues ( pt not sure which hand), h/a on and off and drooling on and off. No grip issues, h/a or drooling today. No new symptoms. Dr Damita Dunnings advised to send to Dr Darnell Level. For review and his opinion. If pt condition changes or worsens before cb from Dr Darnell Level or his CMA pt will go to ED.

## 2016-04-24 NOTE — Telephone Encounter (Signed)
Agree. MRI ordered for endorsed R sided amaurosis fugax at ophtho early 03/2016. If not acutely symptomatic (unilateral weakness, dizziness or unsteadiness) doesn't need to seek ER care. Would ensure he's taking his eliquis '5mg'$  bid for afib.  Pending carotid US and echo. Has f/u apt with me 5/15 but may need to be seen sooner depending on sxs

## 2016-04-24 NOTE — Telephone Encounter (Signed)
Rena to call pt. CVA noted on MR.  She'll see what sx he has now and recently and let me know. If he has no new recent sx, then this may not need ER eval today.  He has h/o amaurosis fugax in early 03/2016.   Routed to PCP as Juluis Rainier

## 2016-04-25 ENCOUNTER — Telehealth: Payer: Self-pay | Admitting: *Deleted

## 2016-04-25 ENCOUNTER — Ambulatory Visit (HOSPITAL_BASED_OUTPATIENT_CLINIC_OR_DEPARTMENT_OTHER): Payer: Medicare Other | Admitting: Internal Medicine

## 2016-04-25 ENCOUNTER — Telehealth: Payer: Self-pay | Admitting: Internal Medicine

## 2016-04-25 ENCOUNTER — Encounter: Payer: Self-pay | Admitting: Internal Medicine

## 2016-04-25 VITALS — BP 115/68 | HR 69 | Temp 97.8°F | Resp 18 | Ht 69.0 in | Wt 161.6 lb

## 2016-04-25 DIAGNOSIS — I639 Cerebral infarction, unspecified: Secondary | ICD-10-CM | POA: Insufficient documentation

## 2016-04-25 DIAGNOSIS — K55069 Acute infarction of intestine, part and extent unspecified: Secondary | ICD-10-CM

## 2016-04-25 DIAGNOSIS — Z5111 Encounter for antineoplastic chemotherapy: Secondary | ICD-10-CM

## 2016-04-25 DIAGNOSIS — C3411 Malignant neoplasm of upper lobe, right bronchus or lung: Secondary | ICD-10-CM

## 2016-04-25 DIAGNOSIS — R599 Enlarged lymph nodes, unspecified: Secondary | ICD-10-CM

## 2016-04-25 DIAGNOSIS — C3491 Malignant neoplasm of unspecified part of right bronchus or lung: Secondary | ICD-10-CM

## 2016-04-25 DIAGNOSIS — J90 Pleural effusion, not elsewhere classified: Secondary | ICD-10-CM | POA: Diagnosis not present

## 2016-04-25 DIAGNOSIS — I63522 Cerebral infarction due to unspecified occlusion or stenosis of left anterior cerebral artery: Secondary | ICD-10-CM | POA: Diagnosis not present

## 2016-04-25 HISTORY — DX: Acute infarction of intestine, part and extent unspecified: K55.069

## 2016-04-25 HISTORY — DX: Cerebral infarction, unspecified: I63.9

## 2016-04-25 MED ORDER — ASPIRIN 81 MG PO TABS: 81.0000 mg | ORAL_TABLET | Freq: Every day | ORAL | Status: DC

## 2016-04-25 NOTE — Progress Notes (Signed)
[facility] [phone]   Fax:([phone]) [place], [place] [address]  DIAGNOSIS: Stage IIIB/IV non-small cell lung cancer, adenocarcinoma diagnosed in March of 2015.  PRIOR THERAPY:  1) Systemic chemotherapy with carboplatin for AUC of 5 and Alimta 500 mg/M2 every 3 weeks. First dose expected on 04/13/2014. Status post 6 cycles. 2)  Maintenance chemotherapy with single agent Alimta 500 mg/M2 every 3 weeks. First cycle on 09/06/2014. Status post 3 cycles. 3) Second course of systemic chemotherapy with carboplatin for AUC of 5 and Alimta 500 MG/M2 every 3 weeks. First cycle 11/08/2014. Status post 6 cycles. 4)  Immunotherapy with Nivolumab 3 MG/KG every 2 weeks. First dose 05/24/2015. Status post 3 cycles. 5) Systemic chemotherapy with docetaxel 60 MG/M2 and Cyramza 10 MG/KG every 3 weeks status post 6 cycles. Last dose was given 11/21/2015 with stable disease.  CURRENT THERAPY: Systemic chemotherapy with docetaxel 75 MG/M2 every 3 weeks. First cycle 05/08/2016.  INTERVAL HISTORY: Shawn Espinoza 75 y.o. male returns to the clinic today for follow up visit accompanied by his wife. The patient has been on observation for the last 6 months. He has been doing fine with no specific complaints. He had visual changes and rolling back of his eye 3 weeks ago. He was seen by his ophthalmologist who recommended for him to have MRI of the brain. 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 There was no evidence of intracranial metastatic disease. He is here today for routine follow-up visit and repeat CT scan of the chest, abdomen and pelvis for restaging of his disease. He has no current neurological  symptoms. He is currently on Eliquis for atrial fibrillation. He did not receive any advice or have any intervention for the findings on the MRI of the brain yet. He denied having any significant chest pain,  but continues to have shortness of breath with exertion and mild cough with no hemoptysis. He has no significant fever or chills, no nausea or vomiting. Is here today for evaluation and discussion of his treatment options.  MEDICAL HISTORY: Past Medical History  Diagnosis Date  . History of diabetes mellitus, type II     resolved with diet, h/o neuropathy  . Diverticulosis of colon   . Hyperlipidemia   . Hypertension   . Fatty liver 02/29/00    abd ultrasound  . Lower back pain   . Systolic murmur 9449    2Decho - normal LV fxn, EF 55%, mild AS, biatrial enlargement  . History of tobacco use quit 1990s  . 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 Personal history of colonic adenomas 07/06/2013  . Hypertensive retinopathy of both eyes 2015    mild  . Dysrhythmia     Atrial fib (found 07/2014)  . Shortness of breath   . Pneumonia   . GERD (gastroesophageal reflux disease)     occasional  . Arthritis   . Neuropathy ([place])   . Constipation   . 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 Depression   . Non-small cell carcinoma of left lung (  [place]) 02/2014    stage IIIb/IV on chemo  . Full code status 09/26/2015  . Atrial fibrillation ([place])   . Lobar pneumonia ([place]) 10/18/2015  . HCAP (healthcare-associated pneumonia) 11/27/2015    IV vanc/zosyn then levaquin courses x2   . Diabetes mellitus type 2 with retinopathy ([place])     ALLERGIES:  is allergic to candesartan cilexetil; diltiazem hcl; doxazosin mesylate; nifedipine; pravastatin; red yeast rice; rosuvastatin; sertraline; simvastatin; and hydrocodone.  MEDICATIONS:  Current  Outpatient Prescriptions  Medication Sig Dispense Refill  . acetaminophen (TYLENOL) 500 MG tablet Take 500 mg by mouth 2 (two) times daily.    Marland Kitchen albuterol (PROVENTIL HFA;VENTOLIN HFA) 108 (90 BASE) MCG/ACT inhaler Inhale 2 puffs into the lungs every 6 (six) hours as needed for wheezing or shortness of breath. 1 Inhaler 3  . Alum & Mag Hydroxide-Simeth (MAGIC MOUTHWASH W/LIDOCAINE) SOLN Take 5 mLs by mouth 3 (three) times daily as needed for mouth pain. 5 mL 0  . amoxicillin (AMOXIL) 500 MG capsule Take 1 capsule (500 mg total) by mouth 3 (three) times daily. 21 capsule 0  . apixaban (ELIQUIS) 5 MG TABS tablet Take 1 tablet (5 mg total) by mouth 2 (two) times daily. Restart when platelets >50.    . benzonatate (TESSALON) 100 MG capsule Take 1 capsule (100 mg total) by mouth 2 (two) times daily as needed for cough. 30 capsule 0  . dexamethasone (DECADRON) 4 MG tablet 2 tab po bid, the day before, day of and day after chemotherapy every 3 weeks 40 tablet 1  . diphenoxylate-atropine (LOMOTIL) 2.5-0.025 MG per tablet Take 2 tablets by mouth 4 (four) times daily as needed for diarrhea or loose stools. 30 tablet 0  . glipiZIDE (GLUCOTROL) 5 MG tablet Take 1 tablet (5 mg total) by mouth as directed. Take one daily when taking steroid (dexamethasone).    Marland Kitchen guaiFENesin (MUCINEX) 600 MG 12 hr tablet Take 600 mg by mouth 2 (two) times daily as needed for cough or to loosen phlegm.     . hydrocortisone (ANUSOL-HC) 25 MG suppository Place 1 suppository (25 mg total) rectally 2 (two) times daily. 12 suppository 0  . KLOR-CON M10 10 MEQ tablet Take 20 mEq by mouth daily as needed (with torsemide).   10  . lidocaine-prilocaine (EMLA) cream Apply 1 application topically as needed. 30 g 1  . loratadine (CLARITIN) 10 MG tablet Take 10 mg by mouth as directed. Reported on 01/05/2016    . metoprolol tartrate (LOPRESSOR) 25 MG tablet TAKE ONE TABLET BY MOUTH TWICE DAILY 60 tablet 2  . Multiple Vitamins-Minerals (ICAPS)  CAPS Take 1 capsule by mouth 2 (two) times daily.     . polyethylene glycol (MIRALAX / GLYCOLAX) packet Take 8.5 g by mouth daily as needed for moderate constipation.     Marland Kitchen spironolactone (ALDACTONE) 50 MG tablet Take 1 tablet (50 mg total) by mouth daily. 30 tablet 3  . sucralfate (CARAFATE) 1 g tablet Take 1 tablet (1 g total) by mouth 3 (three) times daily before meals. 90 tablet 1  . torsemide (DEMADEX) 20 MG tablet Take 20 mg by mouth 2 (two) times daily. Takes one tablet daily and if swelling continues he takes a second pill    . traMADol (ULTRAM) 50 MG tablet Take 1 tablet (50 mg total) by mouth every 6 (six) hours as needed. 30 tablet 3  . triamcinolone cream (KENALOG) 0.1 % Apply 1 application topically 2 (two) times daily. Apply to AA. 60 g 0  No current facility-administered medications for this visit.    SURGICAL HISTORY:  Past Surgical History  Procedure Laterality Date  . Colonoscopy  2004  . Colonoscopy  2014    tubular adenoma x1, mod diverticulosis ([place])  . Flexible bronchoscopy  02/2014    WNL  . Video bronchoscopy Bilateral 03/17/2014    Procedure: VIDEO BRONCHOSCOPY WITH FLUORO;  Surgeon: Tanda Rockers, MD;  Location: [facility];  Service: Cardiopulmonary;  Laterality: Bilateral;  . Chest tube insertion Left 08/26/2014    Procedure: INSERTION OF LEFT  PLEURAL DRAINAGE CATHETER;  Surgeon: Ivin Poot, MD;  Location: [facility];  Service: Thoracic;  Laterality: Left;  . Cataract extraction Right 03/2015    Dr [name]  . Portacath placement Right 03/31/2015    Procedure: INSERTION PORT-A-CATH;  Surgeon: Ivin Poot, MD;  Location: [facility];  Service: Thoracic;  Laterality: Right;  . Removal of pleural drainage catheter Left 03/31/2015    Procedure: REMOVAL OF PLEURAL DRAINAGE CATHETER;  Surgeon: Ivin Poot, MD;  Location: [facility];  Service: Thoracic;  Laterality: Left;    REVIEW OF SYSTEMS:  Constitutional: positive for fatigue Eyes: negative Ears, nose, mouth,  throat, and face: negative Respiratory: positive for dyspnea on exertion Cardiovascular: negative Gastrointestinal: negative Genitourinary:negative Integument/breast: negative Hematologic/lymphatic: negative Musculoskeletal:positive for back pain and muscle weakness Neurological: negative Behavioral/Psych: negative Endocrine: negative Allergic/Immunologic: negative   PHYSICAL EXAMINATION: General appearance: alert, cooperative, fatigued and no distress Head: Normocephalic, without obvious abnormality, atraumatic Neck: no adenopathy, no JVD, supple, symmetrical, trachea midline and thyroid not enlarged, symmetric, no tenderness/mass/nodules Lymph nodes: Cervical, supraclavicular, and axillary nodes normal. Resp: diminished breath sounds LLL and dullness to percussion LLL Back: symmetric, no curvature. ROM normal. No CVA tenderness. Cardio: regular rate and rhythm, S1, S2 normal, no murmur, click, rub or gallop GI: soft, non-tender; bowel sounds normal; no masses,  no organomegaly Extremities: edema 2+ Neurologic: Alert and oriented X 3, normal strength and tone. Normal symmetric reflexes. Normal coordination and gait  ECOG PERFORMANCE STATUS: 1 - Symptomatic but completely ambulatory  Blood pressure 115/68, pulse 69, temperature 97.8 F (36.6 C), temperature source Oral, resp. rate 18, height '5\' 9"'$  (1.753 m), weight 161 lb 9.6 oz (73.301 kg), SpO2 100 %.  LABORATORY DATA: Lab Results  Component Value Date   WBC 5.9 04/23/2016   HGB 12.6* 04/23/2016   HCT 38.1* 04/23/2016   MCV 89.5 04/23/2016   PLT 143 04/23/2016      Chemistry      Component Value Date/Time   NA 138 04/23/2016 1016   NA 139 02/13/2016 1405   K 4.8 04/23/2016 1016   K 5.1 02/13/2016 1405   CL 103 02/13/2016 1405   CO2 25 04/23/2016 1016   CO2 29 02/13/2016 1405   BUN 34.6* 04/23/2016 1016   BUN 27* 02/13/2016 1405   CREATININE 1.3 04/23/2016 1016   CREATININE 1.11 02/13/2016 1405   CREATININE 0.83  02/22/2014 1314      Component Value Date/Time   CALCIUM 9.4 04/23/2016 1016   CALCIUM 9.2 02/13/2016 1405   ALKPHOS 73 04/23/2016 1016   ALKPHOS 57 02/13/2016 1405   AST 15 04/23/2016 1016   AST 16 02/13/2016 1405   ALT 14 04/23/2016 1016   ALT 12 02/13/2016 1405   BILITOT 0.45 04/23/2016 1016   BILITOT 0.5 02/13/2016 1405       RADIOGRAPHIC STUDIES: Ct Chest W Contrast  04/23/2016  CLINICAL DATA:  Subsequent treatment strategy for lung carcinoma. Liver metastasis.  Chemotherapy stopped November 2016 EXAM: CT CHEST, ABDOMEN, AND PELVIS WITH CONTRAST TECHNIQUE: Multidetector CT imaging of the chest, abdomen and pelvis was performed following the standard protocol during bolus administration of intravenous contrast. CONTRAST:  159m ISOVUE-300 IOPAMIDOL (ISOVUE-300) INJECTION 61% COMPARISON:  CT 02/21/2016 FINDINGS: CT CHEST FINDINGS Mediastinum/Nodes: Port in the RIGHT chest wall. No axillary or supraclavicular adenopathy. Increased mediastinal adenopathy. Prevascular lymph node measures 16 mm seen to the ascending aorta increased from 6 mm. RIGHT hilar lymph node measures 32 x 22 mm increased from 18 by 11 mm. There is no central pulmonary embolism. Lungs/Pleura: Consolidation in the superior aspect of the LEFT lower lobe slightly contracted. There is fine reticular pattern in the LEFT lower lobe. Near complete resolution of the nodularity in the LEFT lower lobe. One persistent nodule measuring 7 mm (image 84, series 4 compares to 8 mm. Musculoskeletal: No aggressive osseous lesion. CT ABDOMEN AND PELVIS FINDINGS Hepatobiliary: Large round mixed density enhancing lesion in the RIGHT hepatic lobe measures 6.8 by 5.3 cm compared to 6.8 x 5.4 cm for no change in the central lesion. There is increased enhancement of central nodular portion of this lesion. Inferior to the dominant mass ill-defined low-attenuation lesions which is concerning for infiltrated progression of the lesion into inferior RIGHT  hepatic lobe. (Image 9, series 5) Pancreas: Pancreas is normal. No ductal dilatation. No pancreatic inflammation. Spleen: Normal spleen Adrenals/urinary tract: Adrenal glands and kidneys are normal. The ureters and bladder normal. Stomach/Bowel: Stomach, small bowel, appendix, and cecum are normal. The colon and rectosigmoid colon are normal. Vascular/Lymphatic: Abdominal aorta is normal caliber with atherosclerotic calcification. Retrocrural/lower mediastinal lymph node is increased in size measuring 14 mm (image 50, series 2) increased from 4 mm. Potential increasing gastrohepatic ligament lymph node measuring 8 mm increased from 7 mm. There is no retroperitoneal or periportal lymphadenopathy. No pelvic lymphadenopathy. Reproductive: Prostate normal Other: No peritoneal disease Musculoskeletal: No aggressive osseous lesion. IMPRESSION: Chest Impression: 1. Interval increase in mediastinal lymphadenopathy and RIGHT hilar lymphadenopathy. 2. Improvement in nodularity in the LEFT lower lobe. Abdomen / Pelvis Impression: 1. Increase in size of retrocrural lymph node and potential increasing gastrohepatic lymph node ligament lymph node. 2. Concern for expansion / infiltration of the dominant hepatic mass into the more inferior RIGHT hepatic lobe. Electronically Signed   By: SSuzy BouchardM.D.   On: 04/23/2016 12:30   Mr BJeri CosWHQContrast  04/24/2016  CLINICAL DATA:  Lung cancer.  Recent amaurosis fugax RIGHT eye. EXAM: MRI HEAD WITHOUT AND WITH CONTRAST TECHNIQUE: Multiplanar, multiecho pulse sequences of the brain and surrounding structures were obtained without and with intravenous contrast. CONTRAST:  171mMULTIHANCE GADOBENATE DIMEGLUMINE 529 MG/ML IV SOLN COMPARISON:  MR head 04/01/2014. FINDINGS: Small foci of restricted diffusion representing acute infarction can be seen in the LEFT posterior frontal cortex and subcortical white matter as well as the of LEFT lateral cerebellum. Shower of emboli is  suspected. No similar areas of acute infarction are seen on the RIGHT to correlate with patient's RIGHT amaurosis. No hemorrhage, mass lesion, hydrocephalus, or extra-axial fluid. Mild to moderate atrophy. Mild to moderate subcortical and periventricular T2 and FLAIR hyperintensities, likely chronic microvascular ischemic change. Post infusion, no abnormal enhancement of the brain or meninges. Specifically no evidence for metastatic disease. Flow voids are maintained. No proximal large vessel occlusion. RIGHT vertebral dominant, with LEFT vertebral ending in PICA, unchanged from 2014. Extracranial soft tissues unremarkable. IMPRESSION: Multifocal areas of subcentimeter, nonhemorrhagic, acute infarction affecting the LEFT  cerebral hemisphere and LEFT cerebellum. Shower of emboli is suspected. No evidence for intracranial metastatic disease. These results will be called to the ordering clinician or representative by the Radiologist Assistant, and communication documented in the PACS or zVision Dashboard. Electronically Signed   By: Staci Righter M.D.   On: 04/24/2016 16:06   Ct Abdomen Pelvis W Contrast  04/23/2016  CLINICAL DATA:  Subsequent treatment strategy for lung carcinoma. Liver metastasis. Chemotherapy stopped November 2016 EXAM: CT CHEST, ABDOMEN, AND PELVIS WITH CONTRAST TECHNIQUE: Multidetector CT imaging of the chest, abdomen and pelvis was performed following the standard protocol during bolus administration of intravenous contrast. CONTRAST:  16m ISOVUE-300 IOPAMIDOL (ISOVUE-300) INJECTION 61% COMPARISON:  CT 02/21/2016 FINDINGS: CT CHEST FINDINGS Mediastinum/Nodes: Port in the RIGHT chest wall. No axillary or supraclavicular adenopathy. Increased mediastinal adenopathy. Prevascular lymph node measures 16 mm seen to the ascending aorta increased from 6 mm. RIGHT hilar lymph node measures 32 x 22 mm increased from 18 by 11 mm. There is no central pulmonary embolism. Lungs/Pleura: Consolidation in the  superior aspect of the LEFT lower lobe slightly contracted. There is fine reticular pattern in the LEFT lower lobe. Near complete resolution of the nodularity in the LEFT lower lobe. One persistent nodule measuring 7 mm (image 84, series 4 compares to 8 mm. Musculoskeletal: No aggressive osseous lesion. CT ABDOMEN AND PELVIS FINDINGS Hepatobiliary: Large round mixed density enhancing lesion in the RIGHT hepatic lobe measures 6.8 by 5.3 cm compared to 6.8 x 5.4 cm for no change in the central lesion. There is increased enhancement of central nodular portion of this lesion. Inferior to the dominant mass ill-defined low-attenuation lesions which is concerning for infiltrated progression of the lesion into inferior RIGHT hepatic lobe. (Image 9, series 5) Pancreas: Pancreas is normal. No ductal dilatation. No pancreatic inflammation. Spleen: Normal spleen Adrenals/urinary tract: Adrenal glands and kidneys are normal. The ureters and bladder normal. Stomach/Bowel: Stomach, small bowel, appendix, and cecum are normal. The colon and rectosigmoid colon are normal. Vascular/Lymphatic: Abdominal aorta is normal caliber with atherosclerotic calcification. Retrocrural/lower mediastinal lymph node is increased in size measuring 14 mm (image 50, series 2) increased from 4 mm. Potential increasing gastrohepatic ligament lymph node measuring 8 mm increased from 7 mm. There is no retroperitoneal or periportal lymphadenopathy. No pelvic lymphadenopathy. Reproductive: Prostate normal Other: No peritoneal disease Musculoskeletal: No aggressive osseous lesion. IMPRESSION: Chest Impression: 1. Interval increase in mediastinal lymphadenopathy and RIGHT hilar lymphadenopathy. 2. Improvement in nodularity in the LEFT lower lobe. Abdomen / Pelvis Impression: 1. Increase in size of retrocrural lymph node and potential increasing gastrohepatic lymph node ligament lymph node. 2. Concern for expansion / infiltration of the dominant hepatic mass  into the more inferior RIGHT hepatic lobe. Electronically Signed   By: SSuzy BouchardM.D.   On: 04/23/2016 12:30   ASSESSMENT AND PLAN: this is a very pleasant 75years old white male with:  1) Stage IV non-small cell lung cancer, adenocarcinoma presenting with large right upper lobe lung mass in addition to mediastinal and bilateral hilar lymphadenopathy as well as cervical lymphadenopathy and left pleural effusion. The patient completed a course systemic chemotherapy with carboplatin and Alimta status post 6 cycles. This was followed by 3 cycles of maintenance chemotherapy with single agent Alimta. The patient tolerated his treatment well. He had evidence for disease progression and the patient was started on treatment again with carboplatin and Alimta status post 6 cycles. He was treated with 4 cycles of immunotherapy with Nivolumab discontinued  secondary to disease progression. The patient is currently on systemic chemotherapy with docetaxel and Cyramza status post 6 cycles and tolerated the treatment well except for fatigue and feels admission with recurrent pneumonia.  He has been on observation for the last 6 months. Unfortunately the recent CT scan of the chest, abdomen and pelvis showed evidence for disease progression with increase in the mediastinal lymphadenopathy as well as increase in size of retrocrural lymph node and gastrohepatic lymphadenopathy. I discussed the scan results with the patient and his wife today. I recommended for him to resume his systemic chemotherapy and I will consider him for treatment with docetaxel 75 MG/M2 every 3 weeks with Neulasta support. I would not consider adding Cyramza to his treatment at this point because of the acute stroke. He is expected to start the first dose of this treatment on 05/08/2016.  2) Atrial fibrillation: continue Eliquis.  3) lower extremity edema: Improved. Continue Demadex.  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 I contacted his primary care physician office for management of this condition since they ordered the MRI of the brain. I recommended for the patient to continue his current treatment to his Eliquis and advise him to start taking aspirin daily until he sees a neurologist.  5) CODE STATUS: Discussed again with the patient and his wife. He would like initial resuscitation but no prolonged support Carafate) vegetative state.  The patient would come back for follow-up visit in 2 weeks for evaluation before starting his treatment with chemotherapy. He was advised to call immediately if he has any concerning symptoms in the interval.  The patient voices understanding of current disease status and treatment options and is in agreement with the current care plan.  All questions were answered. The patient knows to call the clinic with any problems, questions or concerns. We can certainly see the patient much sooner if necessary.  Disclaimer: This note was dictated with voice recognition software. Similar sounding words can inadvertently be transcribed and may not be corrected upon review.

## 2016-04-25 NOTE — Telephone Encounter (Signed)
Spoke with patient's wife. She said no unilateral weakness or unsteadiness. She did say dizziness, but that is nothing new for him. He is always dizzy and has been for months. She also said he has started drooling slightly and checked with him and that started well before the eye problem. They went to see Dr. Inda Merlin today who wants to start a '81mg'$  ASA QD. Apparently Dr. Inda Merlin called you while they were there. Please see below and return his call. Thanks!

## 2016-04-25 NOTE — Telephone Encounter (Signed)
Spoke with Dr Earlie Server.  Spoke with patient's wife re imaging findings.  L acute infarcts, no R sided evidence to correlate to R amaurosis fugax from last month.  Wife denies new neurological symptoms. Ongoing mild dizziness with intermittent confusion for the months without recent acute changes.  Will start aspirin '81mg'$  daily, will refer to neurology for CVA and expedite referral. Already has carotid US and echo pending next week.  Adrienne plz remind front office to come find [place] when a provider calls looking for [place], not just send phone note.

## 2016-04-25 NOTE — Telephone Encounter (Signed)
[name] from the cancer center called for Dr Darnell Level -  Pt has had acute stroke and mri is ordered Please advise [name] Call [phone] Thank you

## 2016-04-25 NOTE — Telephone Encounter (Signed)
Left message to call me or Maudie Mercury

## 2016-04-25 NOTE — Telephone Encounter (Signed)
Per staff message and POF I have scheduled appts. Advised scheduler of appts. JMW  

## 2016-04-25 NOTE — Telephone Encounter (Signed)
per pof to sch pt appt-[facility] sch trmt-gave pt copy of avs

## 2016-04-25 NOTE — Telephone Encounter (Signed)
Patient's wife returned call.  Please call her back at home [phone] or cell [phone].

## 2016-04-30 ENCOUNTER — Other Ambulatory Visit: Payer: Self-pay

## 2016-04-30 ENCOUNTER — Ambulatory Visit (HOSPITAL_COMMUNITY): Payer: Medicare Other | Attending: Cardiology

## 2016-04-30 DIAGNOSIS — I071 Rheumatic tricuspid insufficiency: Secondary | ICD-10-CM | POA: Insufficient documentation

## 2016-04-30 DIAGNOSIS — Z87891 Personal history of nicotine dependence: Secondary | ICD-10-CM | POA: Diagnosis not present

## 2016-04-30 DIAGNOSIS — I110 Hypertensive heart disease with heart failure: Secondary | ICD-10-CM | POA: Diagnosis not present

## 2016-04-30 DIAGNOSIS — I509 Heart failure, unspecified: Secondary | ICD-10-CM | POA: Insufficient documentation

## 2016-04-30 DIAGNOSIS — I350 Nonrheumatic aortic (valve) stenosis: Secondary | ICD-10-CM | POA: Insufficient documentation

## 2016-04-30 DIAGNOSIS — I059 Rheumatic mitral valve disease, unspecified: Secondary | ICD-10-CM | POA: Insufficient documentation

## 2016-04-30 DIAGNOSIS — E119 Type 2 diabetes mellitus without complications: Secondary | ICD-10-CM | POA: Diagnosis not present

## 2016-04-30 DIAGNOSIS — G453 Amaurosis fugax: Secondary | ICD-10-CM | POA: Diagnosis not present

## 2016-04-30 DIAGNOSIS — E785 Hyperlipidemia, unspecified: Secondary | ICD-10-CM | POA: Diagnosis not present

## 2016-05-01 ENCOUNTER — Other Ambulatory Visit: Payer: Self-pay | Admitting: Internal Medicine

## 2016-05-01 ENCOUNTER — Encounter: Payer: Self-pay | Admitting: Neurology

## 2016-05-01 ENCOUNTER — Ambulatory Visit (INDEPENDENT_AMBULATORY_CARE_PROVIDER_SITE_OTHER): Payer: Medicare Other | Admitting: Neurology

## 2016-05-01 ENCOUNTER — Ambulatory Visit (HOSPITAL_COMMUNITY)
Admission: RE | Admit: 2016-05-01 | Discharge: 2016-05-01 | Disposition: A | Discharge status: Home / Self Care | Payer: Medicare Other | Source: Ambulatory Visit | Attending: Cardiovascular Disease | Admitting: Cardiovascular Disease

## 2016-05-01 VITALS — BP 143/76 | HR 74 | Ht 69.0 in | Wt 161.2 lb

## 2016-05-01 DIAGNOSIS — I6523 Occlusion and stenosis of bilateral carotid arteries: Secondary | ICD-10-CM | POA: Insufficient documentation

## 2016-05-01 DIAGNOSIS — C3492 Malignant neoplasm of unspecified part of left bronchus or lung: Secondary | ICD-10-CM

## 2016-05-01 DIAGNOSIS — E785 Hyperlipidemia, unspecified: Secondary | ICD-10-CM | POA: Diagnosis not present

## 2016-05-01 DIAGNOSIS — E1140 Type 2 diabetes mellitus with diabetic neuropathy, unspecified: Secondary | ICD-10-CM | POA: Insufficient documentation

## 2016-05-01 DIAGNOSIS — I63422 Cerebral infarction due to embolism of left anterior cerebral artery: Secondary | ICD-10-CM

## 2016-05-01 DIAGNOSIS — G453 Amaurosis fugax: Secondary | ICD-10-CM | POA: Insufficient documentation

## 2016-05-01 DIAGNOSIS — E11319 Type 2 diabetes mellitus with unspecified diabetic retinopathy without macular edema: Secondary | ICD-10-CM | POA: Insufficient documentation

## 2016-05-01 DIAGNOSIS — I10 Essential (primary) hypertension: Secondary | ICD-10-CM | POA: Insufficient documentation

## 2016-05-01 DIAGNOSIS — Z87891 Personal history of nicotine dependence: Secondary | ICD-10-CM | POA: Insufficient documentation

## 2016-05-01 DIAGNOSIS — I480 Paroxysmal atrial fibrillation: Secondary | ICD-10-CM

## 2016-05-01 DIAGNOSIS — K219 Gastro-esophageal reflux disease without esophagitis: Secondary | ICD-10-CM | POA: Diagnosis not present

## 2016-05-01 NOTE — Progress Notes (Signed)
PATIENT: Shawn Espinoza DOB: 1941/10/17  Chief Complaint  Patient presents with  . Cerebrovascular Accident    Reports having visual disturbances that prompted a MRI.  His MRI revealed a recent stroke.  He is scheduled to restart chemotherapy for his lung cancer next week and would like to make sure it is safe.     HISTORICAL  Shawn Espinoza is a 75 years old right-handed male, seen in refer by his primary care physician Dr.  [name], for evaluation of abnormal MRI of the brain, stroke in May tenth 2017  He had a history of hypertension, hyperlipidemia, diabetes, diabetic peripheral neuropathy, stage IV non-small cell lung cancer, presented with right upper lobe lung mass, in addition to mediastinal and bilateral hilar lymphoadenopathy, pleural effusion. He has been treated with chemotherapy, most recent repeat CT chest and abdomen in May 2017 showed evidence of disease progression with increase in the mediastinal lymphadenopathy as well as increase in size of retrocrural lymph node and potential increased gastrohepatic lymph node, concern for expansion/infiltration of dominant hepatic mass into the more right inferior hepatic lobe  He was diagnosed with atrial fibrillation in 2016, has been taking Eliquis 5 mg twice a day, in early April 2017, while shopping with his wife, he noticed sudden onset of loss of the vision in his right upper visual field, since only involving his right eye, few minutes later, he noticed blurry vision in his right visual field, lasting for few minutes, with this shows seated shakiness, then he was able to return back to normal, couple weeks later, he woke up in the morning, had a set onset double vision, lasting for a few minutes,  I personally reviewed MRI of the brain in Apr 25 2016: Multifocal areas of subcentimeter, nonhemorrhagic, acute infarction affecting the LEFT cerebral hemisphere and LEFT cerebellum. Shower of emboli is suspected. There  was no evidence of intracranial metastatic disease.  He was evaluated by his oncologist Dr. Earlie Server, was given aspirin 81 mg on top of his Eliquis 5 mg twice a day, he noticed increased skin bruise,  We also reviewed echocardiogram in May 2017, normal ejection fraction 92-42 %, no embolic source identified, ultrasound of carotid arteries pending  REVIEW OF SYSTEMS: Full 14 system review of systems performed and notable only for swelling in legs, hearing loss, blurred vision, double vision, shortness of breath, cough, wheezing, snoring, feeling cold, increased thirst, achy muscles, allergy, runny nose, weakness, dizziness, restless leg, decreased energy   ALLERGIES: Allergies  Allergen Reactions  . Candesartan Cilexetil     REACTION: Muscle spasms  . Diltiazem Hcl     REACTION: Dizziness  . Doxazosin Mesylate Other (See Comments)    REACTION: H/A's  . Nifedipine     REACTION: Leg swelling  . Pravastatin Other (See Comments)    Muscle aches  . Red Yeast Rice     Muscle aches  . Rosuvastatin Other (See Comments)    REACTION: questionable: severe constipation  . Sertraline Other (See Comments)    oversedation  . Simvastatin Other (See Comments)    REACTION: Muscle aches  . Hydrocodone Other (See Comments)    constipation    HOME MEDICATIONS: Current Outpatient Prescriptions  Medication Sig Dispense Refill  . acetaminophen (TYLENOL) 500 MG tablet Take 500 mg by mouth 2 (two) times daily.    Marland Kitchen albuterol (PROVENTIL HFA;VENTOLIN HFA) 108 (90 BASE) MCG/ACT inhaler Inhale 2 puffs into the lungs every 6 (six) hours as needed for wheezing  or shortness of breath. 1 Inhaler 3  . Alum & Mag Hydroxide-Simeth (MAGIC MOUTHWASH W/LIDOCAINE) SOLN Take 5 mLs by mouth 3 (three) times daily as needed for mouth pain. 5 mL 0  . amoxicillin (AMOXIL) 500 MG capsule Take 1 capsule (500 mg total) by mouth 3 (three) times daily. 21 capsule 0  . apixaban (ELIQUIS) 5 MG TABS tablet Take 1 tablet (5 mg  total) by mouth 2 (two) times daily. Restart when platelets >50.    Marland Kitchen aspirin 81 MG tablet Take 1 tablet (81 mg total) by mouth daily.    . benzonatate (TESSALON) 100 MG capsule Take 1 capsule (100 mg total) by mouth 2 (two) times daily as needed for cough. 30 capsule 0  . dexamethasone (DECADRON) 4 MG tablet 2 tab po bid, the day before, day of and day after chemotherapy every 3 weeks 40 tablet 1  . diphenoxylate-atropine (LOMOTIL) 2.5-0.025 MG per tablet Take 2 tablets by mouth 4 (four) times daily as needed for diarrhea or loose stools. 30 tablet 0  . glipiZIDE (GLUCOTROL) 5 MG tablet Take 1 tablet (5 mg total) by mouth as directed. Take one daily when taking steroid (dexamethasone).    Marland Kitchen guaiFENesin (MUCINEX) 600 MG 12 hr tablet Take 600 mg by mouth 2 (two) times daily as needed for cough or to loosen phlegm.     . hydrocortisone (ANUSOL-HC) 25 MG suppository Place 1 suppository (25 mg total) rectally 2 (two) times daily. 12 suppository 0  . KLOR-CON M10 10 MEQ tablet Take 20 mEq by mouth daily as needed (with torsemide).   10  . lidocaine-prilocaine (EMLA) cream Apply 1 application topically as needed. 30 g 1  . loratadine (CLARITIN) 10 MG tablet Take 10 mg by mouth as directed. Reported on 01/05/2016    . metoprolol tartrate (LOPRESSOR) 25 MG tablet TAKE ONE TABLET BY MOUTH TWICE DAILY 60 tablet 2  . Multiple Vitamins-Minerals (ICAPS) CAPS Take 1 capsule by mouth 2 (two) times daily.     . polyethylene glycol (MIRALAX / GLYCOLAX) packet Take 8.5 g by mouth daily as needed for moderate constipation.     Marland Kitchen spironolactone (ALDACTONE) 50 MG tablet Take 1 tablet (50 mg total) by mouth daily. 30 tablet 3  . sucralfate (CARAFATE) 1 g tablet Take 1 tablet (1 g total) by mouth 3 (three) times daily before meals. 90 tablet 1  . torsemide (DEMADEX) 20 MG tablet Take 20 mg by mouth 2 (two) times daily. Takes one tablet daily and if swelling continues he takes a second pill    . traMADol (ULTRAM) 50 MG  tablet Take 1 tablet (50 mg total) by mouth every 6 (six) hours as needed. 30 tablet 3  . triamcinolone cream (KENALOG) 0.1 % Apply 1 application topically 2 (two) times daily. Apply to AA. 60 g 0   No current facility-administered medications for this visit.    PAST MEDICAL HISTORY: Past Medical History  Diagnosis Date  . History of diabetes mellitus, type II     resolved with diet, h/o neuropathy  . Diverticulosis of colon   . Hyperlipidemia   . Hypertension   . Fatty liver 02/29/00    abd ultrasound  . Lower back pain   . Systolic murmur 9604    2Decho - normal LV fxn, EF 55%, mild AS, biatrial enlargement  . History of tobacco use quit 1990s  . 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 Personal history of colonic adenomas 07/06/2013  . Hypertensive retinopathy of both eyes 2015    mild  . Dysrhythmia     Atrial fib (found 07/2014)  . Shortness of breath   . Pneumonia   . GERD (gastroesophageal reflux disease)     occasional  . Arthritis   . Neuropathy ([place])   . Constipation   . 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 Depression   . Non-small cell carcinoma of left lung ([place]) 02/2014    stage IIIb/IV on chemo  . Full code status 09/26/2015  . Atrial fibrillation ([place])   . Lobar pneumonia ([place]) 10/18/2015  . HCAP (healthcare-associated pneumonia) 11/27/2015    IV vanc/zosyn then levaquin courses x2   . Diabetes mellitus type 2 with retinopathy ([place])   . Acute infarction of intestine, part and extent unspecified ([place]) 04/25/2016  . Stroke (cerebrum) ([place]) 04/25/2016    PAST SURGICAL HISTORY: Past Surgical History  Procedure Laterality Date  . Colonoscopy  2004  . Colonoscopy  2014    tubular adenoma x1, mod diverticulosis ([place])  . Flexible bronchoscopy  02/2014    WNL  . Video bronchoscopy Bilateral 03/17/2014     Procedure: VIDEO BRONCHOSCOPY WITH FLUORO;  Surgeon: Tanda Rockers, MD;  Location: [facility];  Service: Cardiopulmonary;  Laterality: Bilateral;  . Chest tube insertion Left 08/26/2014    Procedure: INSERTION OF LEFT  PLEURAL DRAINAGE CATHETER;  Surgeon: Ivin Poot, MD;  Location: [facility];  Service: Thoracic;  Laterality: Left;  . Cataract extraction Right 03/2015    Dr [name]  . Portacath placement Right 03/31/2015    Procedure: INSERTION PORT-A-CATH;  Surgeon: Ivin Poot, MD;  Location: [facility];  Service: Thoracic;  Laterality: Right;  . Removal of pleural drainage catheter Left 03/31/2015    Procedure: REMOVAL OF PLEURAL DRAINAGE CATHETER;  Surgeon: Ivin Poot, MD;  Location: [facility];  Service: Thoracic;  Laterality: Left;    FAMILY HISTORY: Family History  Problem Relation Age of Onset  . Stroke Mother     multiple mini strokes  . Hypertension Mother   . Heart disease Maternal Grandfather     MI  . Stroke Paternal Grandmother   . Prostate cancer Father     SOCIAL HISTORY:  Social History   Social History  . Marital Status: Married    Spouse Name: N/A  . Number of Children: 2  . Years of Education: Bachelors   Occupational History  . [employer], picks up golf balls.  [employer]    Social History Main Topics  . Smoking status: Former Smoker -- 3.00 packs/day for 30 years    Types: Cigarettes    Quit date: 12/23/1988  . Smokeless tobacco: Never Used  . Alcohol Use: 12.6 oz/week    21 Glasses of wine per week     Comment: 3-4 wine a day ( 8/03- 2 beers, 2 wines, 2 brandies daily)  . Drug Use: No  . Sexual Activity: No   Other Topics Concern  . Not on file   Social History Narrative   Lives with wife.   [employer], failed eye exam, quit   Activity: active at gym regularly, golfing   Diet: healthy - oatmeal, avoids white starches   Right-handed.   1-2 cups caffeine per day.      Advanced directives: Full code. Wouldn't want prolonged  artificial life support. Wife  would be HCproxy     PHYSICAL EXAM   Filed Vitals:   05/01/16 0749  BP: 143/76  Pulse: 74  Height: '5\' 9"'$  (1.753 m)  Weight: 161 lb 4 oz (73.143 kg)    Not recorded      Body mass index is 23.8 kg/(m^2).  PHYSICAL EXAMNIATION:  Gen: NAD, conversant, well nourised, obese, well groomed                     Cardiovascular: Regular rate rhythm, no peripheral edema, warm, nontender. Eyes: Conjunctivae clear without exudates or hemorrhage Neck: Supple, no carotid bruise. Pulmonary: Clear to auscultation bilaterally   NEUROLOGICAL EXAM:  MENTAL STATUS: Speech:    Speech is normal; fluent and spontaneous with normal comprehension.  Cognition:     Orientation to time, place and person     Normal recent and remote memory     Normal Attention span and concentration     Normal Language, naming, repeating,spontaneous speech     Fund of knowledge   CRANIAL NERVES: CN II: Visual fields are full to confrontation. Fundoscopic exam is normal with sharp discs and no vascular changes. Pupils are round equal and briskly reactive to light. CN III, IV, VI: extraocular movement are normal. No ptosis. CN V: Facial sensation is intact to pinprick in all 3 divisions bilaterally. Corneal responses are intact.  CN VII: Face is symmetric with normal eye closure and smile. CN VIII: Hearing is normal to rubbing fingers CN IX, X: Palate elevates symmetrically. Phonation is normal. CN XI: Head turning and shoulder shrug are intact CN XII: Tongue is midline with normal movements and no atrophy.  MOTOR: There is no pronator drift of out-stretched arms. Muscle bulk and tone are normal. Muscle strength is normal, with exception of mild to moderate bilateral toe flexion extension weakness.  REFLEXES: Reflexes are hypoactive and symmetric at the biceps, triceps, knees, and absent at ankles. Plantar responses are flexor.  SENSORY: Length dependent decreased to light touch, pinprick and vibratory  sensation to bilateral knee level   COORDINATION: Rapid alternating movements and fine finger movements are intact. There is no dysmetria on finger-to-nose and heel-knee-shin.    GAIT/STANCE: Wide-based, mildly unsteady, could not perform tiptoe heel or tandem walking,   DIAGNOSTIC DATA (LABS, IMAGING, TESTING) - I reviewed patient records, labs, notes, testing and imaging myself where available.   ASSESSMENT AND PLAN  Shawn Espinoza is a 74 y.o. male   Embolic stroke Atrial fibrillation, on Eliquis 5 mg twice a day Diabetic peripheral neuropathy Stage IV non-small cell lung cancer  I have personally reviewed MRI of the brain, there is evidence of small embolic stroke involving anterior/posterior circulation  I also discussed with vascular specialist Dr. Leonie Man, with his mild unsteady gait, diabetic peripheral neuropathy, there  Is increased chance of bleeding complication with combination therapy of aspirin and anticoagulation Eliquis 5 mg, the risk might be more than the benefit  After discuss with patient, we decided to keep him on Eliquis 5 mg bid, stop ASA '81mg'$  daily.  Complete evaluation with ultrasound of carotid artery, Well hydration   Marcial Pacas, M.D. Ph.D.  [facility] [address], [place], [place] [address] Ph: [phone] Fax: [phone]  CC: [name], MD

## 2016-05-02 ENCOUNTER — Other Ambulatory Visit: Payer: Self-pay | Admitting: Family Medicine

## 2016-05-02 ENCOUNTER — Telehealth: Payer: Self-pay | Admitting: Medical Oncology

## 2016-05-02 NOTE — Telephone Encounter (Signed)
Requested to Add in flush appt with labs and will request to cancel flush with injection

## 2016-05-06 ENCOUNTER — Ambulatory Visit (INDEPENDENT_AMBULATORY_CARE_PROVIDER_SITE_OTHER): Payer: Medicare Other | Admitting: Family Medicine

## 2016-05-06 ENCOUNTER — Encounter: Payer: Self-pay | Admitting: Family Medicine

## 2016-05-06 VITALS — BP 116/70 | HR 76 | Temp 98.0°F | Wt 158.5 lb

## 2016-05-06 DIAGNOSIS — M542 Cervicalgia: Secondary | ICD-10-CM

## 2016-05-06 DIAGNOSIS — M791 Myalgia, unspecified site: Secondary | ICD-10-CM

## 2016-05-06 DIAGNOSIS — C3492 Malignant neoplasm of unspecified part of left bronchus or lung: Secondary | ICD-10-CM | POA: Diagnosis not present

## 2016-05-06 DIAGNOSIS — I10 Essential (primary) hypertension: Secondary | ICD-10-CM | POA: Diagnosis not present

## 2016-05-06 DIAGNOSIS — I63422 Cerebral infarction due to embolism of left anterior cerebral artery: Secondary | ICD-10-CM | POA: Diagnosis not present

## 2016-05-06 DIAGNOSIS — I350 Nonrheumatic aortic (valve) stenosis: Secondary | ICD-10-CM

## 2016-05-06 DIAGNOSIS — I480 Paroxysmal atrial fibrillation: Secondary | ICD-10-CM

## 2016-05-06 NOTE — Assessment & Plan Note (Addendum)
Previously though neulasta related - no recent neulasta. See below

## 2016-05-06 NOTE — Assessment & Plan Note (Addendum)
BP well controlled on metoprolol and spironolactone

## 2016-05-06 NOTE — Assessment & Plan Note (Addendum)
Appreciate neurology assistance with patient after recent MRI showing acute infarct L cerebrum/cerebellum. H/o RIGHT amaurosis fugax.  rec continue eliquis, tight BP and lipid control. rec stop aspirin for now due to increased bleeding risk.

## 2016-05-06 NOTE — Patient Instructions (Addendum)
Ok to continue voltaren gel.  Try methocarbamol for night time.  We will refer you to physical therapy (John O'Halorin at [facility]).  Good to see you today - continue eliquis.

## 2016-05-06 NOTE — Assessment & Plan Note (Signed)
Mild on last echo 04/2016

## 2016-05-06 NOTE — Assessment & Plan Note (Signed)
Planned restarting 4 cycles of chemo this week due to disease progression (enlarging LAD). Appreciate onc care.

## 2016-05-06 NOTE — Progress Notes (Signed)
Pre visit review using our clinic review tool, if applicable. No additional management support is needed unless otherwise documented below in the visit note. 

## 2016-05-06 NOTE — Assessment & Plan Note (Signed)
Continue eliquis  ?

## 2016-05-06 NOTE — Progress Notes (Signed)
BP 116/70 mmHg  Pulse 76  Temp(Src) 98 F (36.7 C) (Oral)  Wt 158 lb 8 oz (71.895 kg)   CC: f/u visit  Subjective:    Patient ID: Shawn Espinoza, male    DOB: [birth year], 75 y.o.   MRN: [id number]  HPI: Shawn Espinoza is a 75 y.o. male presenting on 05/06/2016 for Follow-up   Recent eval by onc known stage 4 lung adenocarcinoma - worsening CT scan - pending restarting chemotherapy.  Recent eval by neurology after acute to subacute embolic CVA. rec continue eliquis 73m bid, stop aspirin 869mdaily (recently added on after stroke)  Ongoing muscle aches at shoulders. Ongoing for last 4 months. Using tramadol and voltaren gel which is helpful. No chest pain or tightness or dyspnea with this.   Has been feeling overall well with improvement in pedal edema.   Always R eye symptoms - amaurosis fugax x1 early April, brief episode of double vision for a few seconds a few weeks afterwards. No further vision changes, never other neurological symptoms.   Relevant past medical, surgical, family and social history reviewed and updated as indicated. Interim medical history since our last visit reviewed. Allergies and medications reviewed and updated. Current Outpatient Prescriptions on File Prior to Visit  Medication Sig  . albuterol (PROVENTIL HFA;VENTOLIN HFA) 108 (90 BASE) MCG/ACT inhaler Inhale 2 puffs into the lungs every 6 (six) hours as needed for wheezing or shortness of breath.  . Alum & Mag Hydroxide-Simeth (MAGIC MOUTHWASH W/LIDOCAINE) SOLN Take 5 mLs by mouth 3 (three) times daily as needed for mouth pain.  . Marland Kitchenpixaban (ELIQUIS) 5 MG TABS tablet Take 1 tablet (5 mg total) by mouth 2 (two) times daily. Restart when platelets >50.  . benzonatate (TESSALON) 100 MG capsule Take 1 capsule (100 mg total) by mouth 2 (two) times daily as needed for cough.  . dexamethasone (DECADRON) 4 MG tablet 2 tab po bid, the day before, day of and day after chemotherapy every 3 weeks (Patient  taking differently: Take 4 mg by mouth 2 (two) times daily. 2 tab twice daily, the day before, day of and day after chemotherapy every 3 weeks)  . diphenoxylate-atropine (LOMOTIL) 2.5-0.025 MG per tablet Take 2 tablets by mouth 4 (four) times daily as needed for diarrhea or loose stools.  . Marland KitchenlipiZIDE (GLUCOTROL) 5 MG tablet Take 1 tablet (5 mg total) by mouth as directed. Take one daily when taking steroid (dexamethasone).  . Marland KitchenuaiFENesin (MUCINEX) 600 MG 12 hr tablet Take 600 mg by mouth 2 (two) times daily as needed for cough or to loosen phlegm.   . hydrocortisone (ANUSOL-HC) 25 MG suppository Place 1 suppository (25 mg total) rectally 2 (two) times daily.  . Marland KitchenLOR-CON M10 10 MEQ tablet Take 20 mEq by mouth daily as needed (with torsemide).   . Marland Kitchenidocaine-prilocaine (EMLA) cream Apply 1 application topically as needed.  . loratadine (CLARITIN) 10 MG tablet Take 10 mg by mouth as directed. Reported on 01/05/2016  . metoprolol tartrate (LOPRESSOR) 25 MG tablet TAKE ONE TABLET BY MOUTH TWICE DAILY  . Multiple Vitamins-Minerals (ICAPS) CAPS Take 1 capsule by mouth 2 (two) times daily.   . polyethylene glycol (MIRALAX / GLYCOLAX) packet Take 8.5 g by mouth daily as needed for moderate constipation.   . Marland Kitchenpironolactone (ALDACTONE) 50 MG tablet TAKE ONE TABLET BY MOUTH ONCE DAILY  . sucralfate (CARAFATE) 1 g tablet Take 1 tablet (1 g total) by mouth 3 (three) times daily before meals.  .Marland Kitchen  torsemide (DEMADEX) 20 MG tablet Take 20 mg by mouth 2 (two) times daily. Takes one tablet daily and if swelling continues he takes a second pill  . triamcinolone cream (KENALOG) 0.1 % Apply 1 application topically 2 (two) times daily. Apply to AA.  Marland Kitchen acetaminophen (TYLENOL) 500 MG tablet Take 500 mg by mouth 2 (two) times daily. Reported on 05/06/2016  . traMADol (ULTRAM) 50 MG tablet Take 1 tablet (50 mg total) by mouth every 6 (six) hours as needed. (Patient not taking: Reported on 05/06/2016)   No current  facility-administered medications on file prior to visit.    Review of Systems Per HPI unless specifically indicated in ROS section     Objective:    BP 116/70 mmHg  Pulse 76  Temp(Src) 98 F (36.7 C) (Oral)  Wt 158 lb 8 oz (71.895 kg)  Wt Readings from Last 3 Encounters:  05/06/16 158 lb 8 oz (71.895 kg)  05/01/16 161 lb 4 oz (73.143 kg)  04/25/16 161 lb 9.6 oz (73.301 kg)    Physical Exam  Constitutional: He appears well-developed and well-nourished. No distress.  HENT:  Mouth/Throat: Oropharynx is clear and moist. No oropharyngeal exudate.  Cardiovascular: Normal rate, normal heart sounds and intact distal pulses.  An irregular rhythm present.  No murmur heard. Pulmonary/Chest: Effort normal and breath sounds normal. No respiratory distress. He has no wheezes. He has no rales.  Musculoskeletal: He exhibits no edema.  No midline spine tenderness. Tender to palpation paracervical mm as well as throughout trapezius  Limited ROM at neck FROM shoulders without pain No pain with testing of SITS  Skin: Skin is warm and dry. No rash noted.  Psychiatric: He has a normal mood and affect.  Nursing note and vitals reviewed.  Results for orders placed or performed in visit on 04/23/16  CBC with Differential/Platelet  Result Value Ref Range   WBC 5.9 4.0 - 10.3 10e3/uL   NEUT# 4.3 1.5 - 6.5 10e3/uL   HGB 12.6 (L) 13.0 - 17.1 g/dL   HCT 38.1 (L) 38.4 - 49.9 %   Platelets 143 140 - 400 10e3/uL   MCV 89.5 79.3 - 98.0 fL   MCH 29.5 27.2 - 33.4 pg   MCHC 33.0 32.0 - 36.0 g/dL   RBC 4.26 4.20 - 5.82 10e6/uL   RDW 16.4 (H) 11.0 - 14.6 %   lymph# 0.8 (L) 0.9 - 3.3 10e3/uL   MONO# 0.5 0.1 - 0.9 10e3/uL   Eosinophils Absolute 0.2 0.0 - 0.5 10e3/uL   Basophils Absolute 0.0 0.0 - 0.1 10e3/uL   NEUT% 72.1 39.0 - 75.0 %   LYMPH% 14.2 14.0 - 49.0 %   MONO% 8.8 0.0 - 14.0 %   EOS% 4.1 0.0 - 7.0 %   BASO% 0.8 0.0 - 2.0 %  Comprehensive metabolic panel  Result Value Ref Range   Sodium  138 136 - 145 mEq/L   Potassium 4.8 3.5 - 5.1 mEq/L   Chloride 104 98 - 109 mEq/L   CO2 25 22 - 29 mEq/L   Glucose 146 (H) 70 - 140 mg/dl   BUN 34.6 (H) 7.0 - 26.0 mg/dL   Creatinine 1.3 0.7 - 1.3 mg/dL   Total Bilirubin 0.45 0.20 - 1.20 mg/dL   Alkaline Phosphatase 73 40 - 150 U/L   AST 15 5 - 34 U/L   ALT 14 0 - 55 U/L   Total Protein 6.8 6.4 - 8.3 g/dL   Albumin 3.7 3.5 - 5.0 g/dL  Calcium 9.4 8.4 - 10.4 mg/dL   Anion Gap 9 3 - 11 mEq/L   EGFR 53 (L) >90 ml/min/1.73 m2   *Note: Due to a large number of results and/or encounters for the requested time period, some results have not been displayed. A complete set of results can be found in Results Review.       Assessment & Plan:   Problem List Items Addressed This Visit    Essential hypertension    BP well controlled on metoprolol and spironolactone      Adenocarcinoma of left lung, stage 4 (HCC)    Planned restarting 4 cycles of chemo this week due to disease progression (enlarging LAD). Appreciate onc care.      Paroxysmal a-fib (H[facility])    Continue eliquis.      Myalgia    Previously though neulasta related - no recent neulasta. See below      Aortic stenosis, moderate    Mild on last echo 04/2016      Stroke (cerebrum) [facility])    Appreciate neurology assistance with patient after recent MRI showing acute infarct L cerebrum/cerebellum. H/o RIGHT amaurosis fugax.  rec continue eliquis, tight BP and lipid control. rec stop aspirin for now due to increased bleeding risk.       Neck pain, musculoskeletal - Primary    Anticipate trapezius strain/spasm with myalgias.  Previous myalgias thought neulasta related but off this for last few months.  rec more regular use of methocarbamol, ok to continue voltaren gel, will refer to PT for treatment course.      Relevant Orders   Ambulatory referral to Physical Therapy       Follow up plan: Return in about 3 months (around 08/06/2016), or as needed, for follow up  visit.  [name], MD

## 2016-05-06 NOTE — Assessment & Plan Note (Signed)
Anticipate trapezius strain/spasm with myalgias.  Previous myalgias thought neulasta related but off this for last few months.  rec more regular use of methocarbamol, ok to continue voltaren gel, will refer to PT for treatment course.

## 2016-05-08 ENCOUNTER — Other Ambulatory Visit (HOSPITAL_BASED_OUTPATIENT_CLINIC_OR_DEPARTMENT_OTHER): Payer: Medicare Other

## 2016-05-08 ENCOUNTER — Ambulatory Visit: Payer: Medicare Other

## 2016-05-08 ENCOUNTER — Telehealth: Payer: Self-pay | Admitting: Internal Medicine

## 2016-05-08 ENCOUNTER — Ambulatory Visit (HOSPITAL_BASED_OUTPATIENT_CLINIC_OR_DEPARTMENT_OTHER): Payer: Medicare Other

## 2016-05-08 ENCOUNTER — Ambulatory Visit (HOSPITAL_BASED_OUTPATIENT_CLINIC_OR_DEPARTMENT_OTHER): Payer: Medicare Other | Admitting: Internal Medicine

## 2016-05-08 ENCOUNTER — Encounter: Payer: Self-pay | Admitting: Internal Medicine

## 2016-05-08 VITALS — BP 145/89 | HR 79 | Temp 97.2°F

## 2016-05-08 VITALS — BP 130/83 | HR 73 | Temp 97.7°F | Resp 18 | Ht 69.0 in | Wt 160.0 lb

## 2016-05-08 DIAGNOSIS — R599 Enlarged lymph nodes, unspecified: Secondary | ICD-10-CM

## 2016-05-08 DIAGNOSIS — I63522 Cerebral infarction due to unspecified occlusion or stenosis of left anterior cerebral artery: Secondary | ICD-10-CM | POA: Diagnosis not present

## 2016-05-08 DIAGNOSIS — Z5111 Encounter for antineoplastic chemotherapy: Secondary | ICD-10-CM

## 2016-05-08 DIAGNOSIS — C3492 Malignant neoplasm of unspecified part of left bronchus or lung: Secondary | ICD-10-CM

## 2016-05-08 DIAGNOSIS — I4891 Unspecified atrial fibrillation: Secondary | ICD-10-CM

## 2016-05-08 DIAGNOSIS — Z7901 Long term (current) use of anticoagulants: Secondary | ICD-10-CM

## 2016-05-08 DIAGNOSIS — G622 Polyneuropathy due to other toxic agents: Secondary | ICD-10-CM

## 2016-05-08 DIAGNOSIS — D696 Thrombocytopenia, unspecified: Secondary | ICD-10-CM

## 2016-05-08 DIAGNOSIS — J90 Pleural effusion, not elsewhere classified: Secondary | ICD-10-CM

## 2016-05-08 DIAGNOSIS — C3411 Malignant neoplasm of upper lobe, right bronchus or lung: Secondary | ICD-10-CM

## 2016-05-08 DIAGNOSIS — Z8673 Personal history of transient ischemic attack (TIA), and cerebral infarction without residual deficits: Secondary | ICD-10-CM | POA: Diagnosis not present

## 2016-05-08 DIAGNOSIS — K769 Liver disease, unspecified: Secondary | ICD-10-CM

## 2016-05-08 DIAGNOSIS — R609 Edema, unspecified: Secondary | ICD-10-CM | POA: Diagnosis not present

## 2016-05-08 DIAGNOSIS — I5032 Chronic diastolic (congestive) heart failure: Secondary | ICD-10-CM

## 2016-05-08 DIAGNOSIS — J8489 Other specified interstitial pulmonary diseases: Secondary | ICD-10-CM

## 2016-05-08 DIAGNOSIS — Z95828 Presence of other vascular implants and grafts: Secondary | ICD-10-CM

## 2016-05-08 DIAGNOSIS — E46 Unspecified protein-calorie malnutrition: Secondary | ICD-10-CM

## 2016-05-08 DIAGNOSIS — R530 Neoplastic (malignant) related fatigue: Secondary | ICD-10-CM

## 2016-05-08 LAB — COMPREHENSIVE METABOLIC PANEL
ALK PHOS: 79 U/L (ref 40–150)
ALT: 15 U/L (ref 0–55)
ANION GAP: 10 meq/L (ref 3–11)
AST: 10 U/L (ref 5–34)
Albumin: 3.7 g/dL (ref 3.5–5.0)
BILIRUBIN TOTAL: 0.31 mg/dL (ref 0.20–1.20)
BUN: 49.9 mg/dL — ABNORMAL HIGH (ref 7.0–26.0)
CALCIUM: 9 mg/dL (ref 8.4–10.4)
CO2: 21 mEq/L — ABNORMAL LOW (ref 22–29)
Chloride: 103 mEq/L (ref 98–109)
Creatinine: 1.4 mg/dL — ABNORMAL HIGH (ref 0.7–1.3)
EGFR: 48 mL/min/{1.73_m2} — AB (ref >90)
Glucose: 297 mg/dl — ABNORMAL HIGH (ref 70–140)
POTASSIUM: 5 meq/L (ref 3.5–5.1)
Sodium: 135 mEq/L — ABNORMAL LOW (ref 136–145)
Total Protein: 6.9 g/dL (ref 6.4–8.3)

## 2016-05-08 LAB — CBC WITH DIFFERENTIAL/PLATELET
BASO%: 0.2 % (ref 0.0–2.0)
BASOS ABS: 0 10*3/uL (ref 0.0–0.1)
EOS ABS: 0 10*3/uL (ref 0.0–0.5)
EOS%: 0 % (ref 0.0–7.0)
HCT: 38.5 % (ref 38.4–49.9)
HEMOGLOBIN: 12.5 g/dL — AB (ref 13.0–17.1)
LYMPH%: 3.1 % — AB (ref 14.0–49.0)
MCH: 29.3 pg (ref 27.2–33.4)
MCHC: 32.6 g/dL (ref 32.0–36.0)
MCV: 90 fL (ref 79.3–98.0)
MONO#: 0.3 10*3/uL (ref 0.1–0.9)
MONO%: 2.4 % (ref 0.0–14.0)
NEUT%: 94.3 % — ABNORMAL HIGH (ref 39.0–75.0)
NEUTROS ABS: 13 10*3/uL — AB (ref 1.5–6.5)
PLATELETS: 141 10*3/uL (ref 140–400)
RBC: 4.28 10*6/uL (ref 4.20–5.82)
RDW: 15.5 % — AB (ref 11.0–14.6)
WBC: 13.8 10*3/uL — AB (ref 4.0–10.3)
lymph#: 0.4 10*3/uL — ABNORMAL LOW (ref 0.9–3.3)

## 2016-05-08 MED ORDER — SODIUM CHLORIDE 0.9 % IV SOLN
10.0000 mg | Freq: Once | INTRAVENOUS | Status: AC
  Administered 2016-05-08: 10 mg via INTRAVENOUS
  Filled 2016-05-08: qty 1

## 2016-05-08 MED ORDER — LIDOCAINE-PRILOCAINE 2.5-2.5 % EX CREA: 1.0000 "application " | TOPICAL_CREAM | CUTANEOUS | Status: AC | PRN

## 2016-05-08 MED ORDER — HEPARIN SOD (PORK) LOCK FLUSH 100 UNIT/ML IV SOLN
500.0000 [IU] | Freq: Once | INTRAVENOUS | Status: AC | PRN
  Administered 2016-05-08: 500 [IU]
  Filled 2016-05-08: qty 5

## 2016-05-08 MED ORDER — SODIUM CHLORIDE 0.9 % IV SOLN
75.0000 mg/m2 | Freq: Once | INTRAVENOUS | Status: AC
  Administered 2016-05-08: 140 mg via INTRAVENOUS
  Filled 2016-05-08: qty 14

## 2016-05-08 MED ORDER — SODIUM CHLORIDE 0.9 % IV SOLN
Freq: Once | INTRAVENOUS | Status: AC
  Administered 2016-05-08: 12:00:00 via INTRAVENOUS

## 2016-05-08 MED ORDER — TRAMADOL HCL 50 MG PO TABS: 50.0000 mg | ORAL_TABLET | Freq: Four times a day (QID) | ORAL | Status: DC | PRN

## 2016-05-08 MED ORDER — SODIUM CHLORIDE 0.9 % IJ SOLN
10.0000 mL | INTRAMUSCULAR | Status: AC | PRN
  Administered 2016-05-08: 10 mL
  Filled 2016-05-08: qty 10

## 2016-05-08 MED ORDER — SODIUM CHLORIDE 0.9% FLUSH
10.0000 mL | INTRAVENOUS | Status: DC | PRN
  Administered 2016-05-08: 10 mL
  Filled 2016-05-08: qty 10

## 2016-05-08 NOTE — Progress Notes (Signed)
[facility] [phone]   Fax:([phone]) [place], [place] [address]  DIAGNOSIS: Stage IIIB/IV non-small cell lung cancer, adenocarcinoma diagnosed in March of 2015.  PRIOR THERAPY:  1) Systemic chemotherapy with carboplatin for AUC of 5 and Alimta 500 mg/M2 every 3 weeks. First dose expected on 04/13/2014. Status post 6 cycles. 2)  Maintenance chemotherapy with single agent Alimta 500 mg/M2 every 3 weeks. First cycle on 09/06/2014. Status post 3 cycles. 3) Second course of systemic chemotherapy with carboplatin for AUC of 5 and Alimta 500 MG/M2 every 3 weeks. First cycle 11/08/2014. Status post 6 cycles. 4)  Immunotherapy with Nivolumab 3 MG/KG every 2 weeks. First dose 05/24/2015. Status post 3 cycles. 5) Systemic chemotherapy with docetaxel 60 MG/M2 and Cyramza 10 MG/KG every 3 weeks status post 6 cycles. Last dose was given 11/21/2015 with stable disease.  CURRENT THERAPY: Systemic chemotherapy with docetaxel 75 MG/M2 every 3 weeks. First cycle 05/08/2016.  INTERVAL HISTORY: Shawn Espinoza 75 y.o. male returns to the clinic today for follow up visit accompanied by his wife. The patient has been on observation for the last 6 months. He was found recently to have evidence for disease progression and he is here to start the first cycle of systemic chemotherapy with docetaxel. He has a history of recent stroke and he was seen by neurology and advised to continue treatment was Eliquis. Aspirin was discontinued. He is feeling very well today except for mild peripheral neuropathy. He denied having any significant chest pain, shortness of breath, cough or hemoptysis. He continues to have some aching pain. He has no nausea or vomiting, no fever or chills.  MEDICAL HISTORY: Past Medical History  Diagnosis Date  . History of diabetes mellitus, type II     resolved with diet, h/o neuropathy  .  Diverticulosis of colon   . Hyperlipidemia   . Hypertension   . Fatty liver 02/29/00    abd ultrasound  . Lower back pain   . Systolic murmur 6045    2Decho - normal LV fxn, EF 55%, mild AS, biatrial enlargement  . History of tobacco use quit 1990s  . 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 Personal history of colonic adenomas 07/06/2013  . Hypertensive retinopathy of both eyes 2015    mild  . Dysrhythmia     Atrial fib (found 07/2014)  . Shortness of breath   . Pneumonia   . GERD (gastroesophageal reflux disease)     occasional  . Arthritis   . Neuropathy ([place])   . Constipation   . 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 Depression   . Non-small cell carcinoma of left lung ([place]) 02/2014    stage IIIb/IV on chemo  . Full code status 09/26/2015  . Atrial fibrillation ([place])   . Lobar pneumonia ([place]) 10/18/2015  . HCAP (healthcare-associated pneumonia) 11/27/2015    IV vanc/zosyn then levaquin courses x2   . Diabetes mellitus type 2 with retinopathy ([place])   . Acute infarction of intestine, part and extent unspecified ([place]) 04/25/2016  . 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  MEDICATIONS:  Current Outpatient Prescriptions  Medication Sig Dispense Refill  . acetaminophen (TYLENOL) 500 MG tablet Take 500 mg by mouth 2 (two) times daily. Reported on 05/06/2016    . albuterol (PROVENTIL HFA;VENTOLIN HFA) 108 (90 BASE) MCG/ACT inhaler Inhale 2 puffs into the lungs every 6 (six) hours as needed for wheezing or shortness of breath. 1 Inhaler 3  . Alum & Mag Hydroxide-Simeth (MAGIC MOUTHWASH W/LIDOCAINE) SOLN Take 5 mLs by mouth 3 (three) times daily as needed for  mouth pain. 5 mL 0  . apixaban (ELIQUIS) 5 MG TABS tablet Take 1 tablet (5 mg total) by mouth 2 (two) times daily. Restart when platelets >50.    . benzonatate (TESSALON) 100 MG capsule Take 1 capsule (100 mg total) by mouth 2 (two) times daily as needed for cough. 30 capsule 0  . dexamethasone (DECADRON) 4 MG tablet 2 tab po bid, the day before, day of and day after chemotherapy every 3 weeks (Patient taking differently: Take 4 mg by mouth 2 (two) times daily. 2 tab twice daily, the day before, day of and day after chemotherapy every 3 weeks) 40 tablet 1  . diphenoxylate-atropine (LOMOTIL) 2.5-0.025 MG per tablet Take 2 tablets by mouth 4 (four) times daily as needed for diarrhea or loose stools. 30 tablet 0  . glipiZIDE (GLUCOTROL) 5 MG tablet Take 1 tablet (5 mg total) by mouth as directed. Take one daily when taking steroid (dexamethasone).    Marland Kitchen guaiFENesin (MUCINEX) 600 MG 12 hr tablet Take 600 mg by mouth 2 (two) times daily as needed for cough or to loosen phlegm.     . hydrocortisone (ANUSOL-HC) 25 MG suppository Place 1 suppository (25 mg total) rectally 2 (two) times daily. 12 suppository 0  . KLOR-CON M10 10 MEQ tablet Take 20 mEq by mouth daily as needed (with torsemide).   10  . lidocaine-prilocaine (EMLA) cream Apply 1 application topically as needed. 30 g 1  . loratadine (CLARITIN) 10 MG tablet Take 10 mg by mouth as directed. Reported on 01/05/2016    . methocarbamol (ROBAXIN) 500 MG tablet Take 500 mg by mouth 3 (three) times daily as needed. Muscle pain    . metoprolol tartrate (LOPRESSOR) 25 MG tablet TAKE ONE TABLET BY MOUTH TWICE DAILY 60 tablet 2  . Multiple Vitamins-Minerals (ICAPS) CAPS Take 1 capsule by mouth 2 (two) times daily.     . polyethylene glycol (MIRALAX / GLYCOLAX) packet Take 8.5 g by mouth daily as needed for moderate constipation.     Marland Kitchen spironolactone (ALDACTONE) 50 MG tablet TAKE ONE TABLET BY MOUTH ONCE DAILY 30 tablet 6  . sucralfate (CARAFATE) 1 g tablet Take  1 tablet (1 g total) by mouth 3 (three) times daily before meals. 90 tablet 1  . torsemide (DEMADEX) 20 MG tablet Take 20 mg by mouth 2 (two) times daily. Takes one tablet daily and if swelling continues he takes a second pill    . traMADol (ULTRAM) 50 MG tablet Take 1 tablet (50 mg total) by mouth every 6 (six) hours as needed. 30 tablet 3  . triamcinolone cream (KENALOG) 0.1 % Apply 1 application topically 2 (two) times daily. Apply to AA. 60 g 0   No current facility-administered medications for this visit.    SURGICAL HISTORY:  Past Surgical History  Procedure Laterality Date  . Colonoscopy  2004  . Colonoscopy  2014    tubular adenoma x1, mod diverticulosis ([place])  . Flexible bronchoscopy  02/2014  WNL  . Video bronchoscopy Bilateral 03/17/2014    Procedure: VIDEO BRONCHOSCOPY WITH FLUORO;  Surgeon: Tanda Rockers, MD;  Location: [facility];  Service: Cardiopulmonary;  Laterality: Bilateral;  . Chest tube insertion Left 08/26/2014    Procedure: INSERTION OF LEFT  PLEURAL DRAINAGE CATHETER;  Surgeon: Ivin Poot, MD;  Location: [facility];  Service: Thoracic;  Laterality: Left;  . Cataract extraction Right 03/2015    Dr [name]  . Portacath placement Right 03/31/2015    Procedure: INSERTION PORT-A-CATH;  Surgeon: Ivin Poot, MD;  Location: [facility];  Service: Thoracic;  Laterality: Right;  . Removal of pleural drainage catheter Left 03/31/2015    Procedure: REMOVAL OF PLEURAL DRAINAGE CATHETER;  Surgeon: Ivin Poot, MD;  Location: [facility];  Service: Thoracic;  Laterality: Left;    REVIEW OF SYSTEMS:  A comprehensive review of systems was negative except for: Musculoskeletal: positive for arthralgias and myalgias Neurological: positive for paresthesia   PHYSICAL EXAMINATION: General appearance: alert, cooperative, fatigued and no distress Head: Normocephalic, without obvious abnormality, atraumatic Neck: no adenopathy, no JVD, supple, symmetrical, trachea midline and thyroid not  enlarged, symmetric, no tenderness/mass/nodules Lymph nodes: Cervical, supraclavicular, and axillary nodes normal. Resp: diminished breath sounds LLL and dullness to percussion LLL Back: symmetric, no curvature. ROM normal. No CVA tenderness. Cardio: regular rate and rhythm, S1, S2 normal, no murmur, click, rub or gallop GI: soft, non-tender; bowel sounds normal; no masses,  no organomegaly Extremities: edema 2+ Neurologic: Alert and oriented X 3, normal strength and tone. Normal symmetric reflexes. Normal coordination and gait  ECOG PERFORMANCE STATUS: 1 - Symptomatic but completely ambulatory  Blood pressure 130/83, pulse 73, temperature 97.7 F (36.5 C), temperature source Oral, resp. rate 18, height '5\' 9"'$  (1.753 m), weight 160 lb (72.576 kg), SpO2 99 %.  LABORATORY DATA: Lab Results  Component Value Date   WBC 13.8* 05/08/2016   HGB 12.5* 05/08/2016   HCT 38.5 05/08/2016   MCV 90.0 05/08/2016   PLT 141 05/08/2016      Chemistry      Component Value Date/Time   NA 135* 05/08/2016 1003   NA 139 02/13/2016 1405   K 5.0 05/08/2016 1003   K 5.1 02/13/2016 1405   CL 103 02/13/2016 1405   CO2 21* 05/08/2016 1003   CO2 29 02/13/2016 1405   BUN 49.9* 05/08/2016 1003   BUN 27* 02/13/2016 1405   CREATININE 1.4* 05/08/2016 1003   CREATININE 1.11 02/13/2016 1405   CREATININE 0.83 02/22/2014 1314      Component Value Date/Time   CALCIUM 9.0 05/08/2016 1003   CALCIUM 9.2 02/13/2016 1405   ALKPHOS 79 05/08/2016 1003   ALKPHOS 57 02/13/2016 1405   AST 10 05/08/2016 1003   AST 16 02/13/2016 1405   ALT 15 05/08/2016 1003   ALT 12 02/13/2016 1405   BILITOT 0.31 05/08/2016 1003   BILITOT 0.5 02/13/2016 1405       RADIOGRAPHIC STUDIES: Ct Chest W Contrast  04/23/2016  CLINICAL DATA:  Subsequent treatment strategy for lung carcinoma. Liver metastasis. Chemotherapy stopped November 2016 EXAM: CT CHEST, ABDOMEN, AND PELVIS WITH CONTRAST TECHNIQUE: Multidetector CT imaging of the  chest, abdomen and pelvis was performed following the standard protocol during bolus administration of intravenous contrast. CONTRAST:  174m ISOVUE-300 IOPAMIDOL (ISOVUE-300) INJECTION 61% COMPARISON:  CT 02/21/2016 FINDINGS: CT CHEST FINDINGS Mediastinum/Nodes: Port in the RIGHT chest wall. No axillary or supraclavicular adenopathy. Increased mediastinal adenopathy. Prevascular lymph node measures 16 mm seen to the  ascending aorta increased from 6 mm. RIGHT hilar lymph node measures 32 x 22 mm increased from 18 by 11 mm. There is no central pulmonary embolism. Lungs/Pleura: Consolidation in the superior aspect of the LEFT lower lobe slightly contracted. There is fine reticular pattern in the LEFT lower lobe. Near complete resolution of the nodularity in the LEFT lower lobe. One persistent nodule measuring 7 mm (image 84, series 4 compares to 8 mm. Musculoskeletal: No aggressive osseous lesion. CT ABDOMEN AND PELVIS FINDINGS Hepatobiliary: Large round mixed density enhancing lesion in the RIGHT hepatic lobe measures 6.8 by 5.3 cm compared to 6.8 x 5.4 cm for no change in the central lesion. There is increased enhancement of central nodular portion of this lesion. Inferior to the dominant mass ill-defined low-attenuation lesions which is concerning for infiltrated progression of the lesion into inferior RIGHT hepatic lobe. (Image 9, series 5) Pancreas: Pancreas is normal. No ductal dilatation. No pancreatic inflammation. Spleen: Normal spleen Adrenals/urinary tract: Adrenal glands and kidneys are normal. The ureters and bladder normal. Stomach/Bowel: Stomach, small bowel, appendix, and cecum are normal. The colon and rectosigmoid colon are normal. Vascular/Lymphatic: Abdominal aorta is normal caliber with atherosclerotic calcification. Retrocrural/lower mediastinal lymph node is increased in size measuring 14 mm (image 50, series 2) increased from 4 mm. Potential increasing gastrohepatic ligament lymph node  measuring 8 mm increased from 7 mm. There is no retroperitoneal or periportal lymphadenopathy. No pelvic lymphadenopathy. Reproductive: Prostate normal Other: No peritoneal disease Musculoskeletal: No aggressive osseous lesion. IMPRESSION: Chest Impression: 1. Interval increase in mediastinal lymphadenopathy and RIGHT hilar lymphadenopathy. 2. Improvement in nodularity in the LEFT lower lobe. Abdomen / Pelvis Impression: 1. Increase in size of retrocrural lymph node and potential increasing gastrohepatic lymph node ligament lymph node. 2. Concern for expansion / infiltration of the dominant hepatic mass into the more inferior RIGHT hepatic lobe. Electronically Signed   By: Suzy Bouchard M.D.   On: 04/23/2016 12:30   Mr Jeri Cos GY Contrast  04/24/2016  CLINICAL DATA:  Lung cancer.  Recent amaurosis fugax RIGHT eye. EXAM: MRI HEAD WITHOUT AND WITH CONTRAST TECHNIQUE: Multiplanar, multiecho pulse sequences of the brain and surrounding structures were obtained without and with intravenous contrast. CONTRAST:  53m MULTIHANCE GADOBENATE DIMEGLUMINE 529 MG/ML IV SOLN COMPARISON:  MR head 04/01/2014. FINDINGS: Small foci of restricted diffusion representing acute infarction can be seen in the LEFT posterior frontal cortex and subcortical white matter as well as the of LEFT lateral cerebellum. Shower of emboli is suspected. No similar areas of acute infarction are seen on the RIGHT to correlate with patient's RIGHT amaurosis. No hemorrhage, mass lesion, hydrocephalus, or extra-axial fluid. Mild to moderate atrophy. Mild to moderate subcortical and periventricular T2 and FLAIR hyperintensities, likely chronic microvascular ischemic change. Post infusion, no abnormal enhancement of the brain or meninges. Specifically no evidence for metastatic disease. Flow voids are maintained. No proximal large vessel occlusion. RIGHT vertebral dominant, with LEFT vertebral ending in PICA, unchanged from 2014. Extracranial soft tissues  unremarkable. IMPRESSION: Multifocal areas of subcentimeter, nonhemorrhagic, acute infarction affecting the LEFT cerebral hemisphere and LEFT cerebellum. Shower of emboli is suspected. No evidence for intracranial metastatic disease. These results will be called to the ordering clinician or representative by the Radiologist Assistant, and communication documented in the PACS or zVision Dashboard. Electronically Signed   By: JStaci RighterM.D.   On: 04/24/2016 16:06   Ct Abdomen Pelvis W Contrast  04/23/2016  CLINICAL DATA:  Subsequent treatment strategy for lung carcinoma. Liver  metastasis. Chemotherapy stopped November 2016 EXAM: CT CHEST, ABDOMEN, AND PELVIS WITH CONTRAST TECHNIQUE: Multidetector CT imaging of the chest, abdomen and pelvis was performed following the standard protocol during bolus administration of intravenous contrast. CONTRAST:  158m ISOVUE-300 IOPAMIDOL (ISOVUE-300) INJECTION 61% COMPARISON:  CT 02/21/2016 FINDINGS: CT CHEST FINDINGS Mediastinum/Nodes: Port in the RIGHT chest wall. No axillary or supraclavicular adenopathy. Increased mediastinal adenopathy. Prevascular lymph node measures 16 mm seen to the ascending aorta increased from 6 mm. RIGHT hilar lymph node measures 32 x 22 mm increased from 18 by 11 mm. There is no central pulmonary embolism. Lungs/Pleura: Consolidation in the superior aspect of the LEFT lower lobe slightly contracted. There is fine reticular pattern in the LEFT lower lobe. Near complete resolution of the nodularity in the LEFT lower lobe. One persistent nodule measuring 7 mm (image 84, series 4 compares to 8 mm. Musculoskeletal: No aggressive osseous lesion. CT ABDOMEN AND PELVIS FINDINGS Hepatobiliary: Large round mixed density enhancing lesion in the RIGHT hepatic lobe measures 6.8 by 5.3 cm compared to 6.8 x 5.4 cm for no change in the central lesion. There is increased enhancement of central nodular portion of this lesion. Inferior to the dominant mass  ill-defined low-attenuation lesions which is concerning for infiltrated progression of the lesion into inferior RIGHT hepatic lobe. (Image 9, series 5) Pancreas: Pancreas is normal. No ductal dilatation. No pancreatic inflammation. Spleen: Normal spleen Adrenals/urinary tract: Adrenal glands and kidneys are normal. The ureters and bladder normal. Stomach/Bowel: Stomach, small bowel, appendix, and cecum are normal. The colon and rectosigmoid colon are normal. Vascular/Lymphatic: Abdominal aorta is normal caliber with atherosclerotic calcification. Retrocrural/lower mediastinal lymph node is increased in size measuring 14 mm (image 50, series 2) increased from 4 mm. Potential increasing gastrohepatic ligament lymph node measuring 8 mm increased from 7 mm. There is no retroperitoneal or periportal lymphadenopathy. No pelvic lymphadenopathy. Reproductive: Prostate normal Other: No peritoneal disease Musculoskeletal: No aggressive osseous lesion. IMPRESSION: Chest Impression: 1. Interval increase in mediastinal lymphadenopathy and RIGHT hilar lymphadenopathy. 2. Improvement in nodularity in the LEFT lower lobe. Abdomen / Pelvis Impression: 1. Increase in size of retrocrural lymph node and potential increasing gastrohepatic lymph node ligament lymph node. 2. Concern for expansion / infiltration of the dominant hepatic mass into the more inferior RIGHT hepatic lobe. Electronically Signed   By: SSuzy BouchardM.D.   On: 04/23/2016 12:30   ASSESSMENT AND PLAN: this is a very pleasant 75years old white male with:  1) Stage IV non-small cell lung cancer, adenocarcinoma presenting with large right upper lobe lung mass in addition to mediastinal and bilateral hilar lymphadenopathy as well as cervical lymphadenopathy and left pleural effusion. The patient completed a course systemic chemotherapy with carboplatin and Alimta status post 6 cycles. This was followed by 3 cycles of maintenance chemotherapy with single agent  Alimta. The patient tolerated his treatment well. He had evidence for disease progression and the patient was started on treatment again with carboplatin and Alimta status post 6 cycles. He was treated with 4 cycles of immunotherapy with Nivolumab discontinued secondary to disease progression. The patient is currently on systemic chemotherapy with docetaxel and Cyramza status post 6 cycles and tolerated the treatment well except for fatigue and feels admission with recurrent pneumonia.  He has been on observation for the last 6 months. Unfortunately the recent CT scan of the chest, abdomen and pelvis showed evidence for disease progression with increase in the mediastinal lymphadenopathy as well as increase in size of retrocrural  lymph node and gastrohepatic lymphadenopathy. He will resume his systemic chemotherapy with single agent docetaxel, first cycle today. I will not add Cyramza to his regimen because of the recent stroke. The patient would come back for follow-up visit in 3 weeks for reevaluation before starting cycle #2.  2) Atrial fibrillation: continue Eliquis.  3) lower extremity edema: Improved. Continue Demadex.  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 I contacted his primary care physician office for management of this condition since they ordered the MRI of the brain. I recommended for the patient to continue his current treatment to his Eliquis and advise him to start taking aspirin daily until he sees a neurologist.  5) CODE STATUS: Discussed again with the patient and his wife. He would like initial resuscitation but no prolonged support Carafate) vegetative state. He was given a refill for tramadol and Emla cream today.  He was advised to call immediately if he has any concerning symptoms in the interval.  The patient voices understanding of current disease status and treatment options and is in  agreement with the current care plan.  All questions were answered. The patient knows to call the clinic with any problems, questions or concerns. We can certainly see the patient much sooner if necessary.  Disclaimer: This note was dictated with voice recognition software. Similar sounding words can inadvertently be transcribed and may not be corrected upon review.

## 2016-05-08 NOTE — Telephone Encounter (Signed)
pt sch alrady made out req no avs

## 2016-05-08 NOTE — Patient Instructions (Signed)

## 2016-05-08 NOTE — Patient Instructions (Signed)
Docetaxel injection  What is this medicine?  DOCETAXEL (doe se TAX el) is a chemotherapy drug. It targets fast dividing cells, like cancer cells, and causes these cells to die. This medicine is used to treat many types of cancers like breast cancer, certain stomach cancers, head and neck cancer, lung cancer, and prostate cancer.  This medicine may be used for other purposes; ask your health care provider or pharmacist if you have questions.  What should I tell my health care provider before I take this medicine?  They need to know if you have any of these conditions:  -infection (especially a virus infection such as chickenpox, cold sores, or herpes)  -liver disease  -low blood counts, like low white cell, platelet, or red cell counts  -an unusual or allergic reaction to docetaxel, polysorbate 80, other chemotherapy agents, other medicines, foods, dyes, or preservatives  -pregnant or trying to get pregnant  -breast-feeding  How should I use this medicine?  This drug is given as an infusion into a vein. It is administered in a hospital or clinic by a specially trained health care professional.  Talk to your pediatrician regarding the use of this medicine in children. Special care may be needed.  Overdosage: If you think you have taken too much of this medicine contact a poison control center or emergency room at once.  NOTE: This medicine is only for you. Do not share this medicine with others.  What if I miss a dose?  It is important not to miss your dose. Call your doctor or health care professional if you are unable to keep an appointment.  What may interact with this medicine?  -cyclosporine  -erythromycin  -ketoconazole  -medicines to increase blood counts like filgrastim, pegfilgrastim, sargramostim  -vaccines  Talk to your doctor or health care professional before taking any of these medicines:  -acetaminophen  -aspirin  -ibuprofen  -ketoprofen  -naproxen  This list may not describe all possible interactions.  Give your health care provider a list of all the medicines, herbs, non-prescription drugs, or dietary supplements you use. Also tell them if you smoke, drink alcohol, or use illegal drugs. Some items may interact with your medicine.  What should I watch for while using this medicine?  Your condition will be monitored carefully while you are receiving this medicine. You will need important blood work done while you are taking this medicine.  This drug may make you feel generally unwell. This is not uncommon, as chemotherapy can affect healthy cells as well as cancer cells. Report any side effects. Continue your course of treatment even though you feel ill unless your doctor tells you to stop.  In some cases, you may be given additional medicines to help with side effects. Follow all directions for their use.  Call your doctor or health care professional for advice if you get a fever, chills or sore throat, or other symptoms of a cold or flu. Do not treat yourself. This drug decreases your body's ability to fight infections. Try to avoid being around people who are sick.  This medicine may increase your risk to bruise or bleed. Call your doctor or health care professional if you notice any unusual bleeding.  This medicine may contain alcohol in the product. You may get drowsy or dizzy. Do not drive, use machinery, or do anything that needs mental alertness until you know how this medicine affects you. Do not stand or sit up quickly, especially if you are an older   patient. This reduces the risk of dizzy or fainting spells. Avoid alcoholic drinks.  Do not become pregnant while taking this medicine. Women should inform their doctor if they wish to become pregnant or think they might be pregnant. There is a potential for serious side effects to an unborn child. Talk to your health care professional or pharmacist for more information. Do not breast-feed an infant while taking this medicine.  What side effects may I notice  from receiving this medicine?  Side effects that you should report to your doctor or health care professional as soon as possible:  -allergic reactions like skin rash, itching or hives, swelling of the face, lips, or tongue  -low blood counts - This drug may decrease the number of white blood cells, red blood cells and platelets. You may be at increased risk for infections and bleeding.  -signs of infection - fever or chills, cough, sore throat, pain or difficulty passing urine  -signs of decreased platelets or bleeding - bruising, pinpoint red spots on the skin, black, tarry stools, nosebleeds  -signs of decreased red blood cells - unusually weak or tired, fainting spells, lightheadedness  -breathing problems  -fast or irregular heartbeat  -low blood pressure  -mouth sores  -nausea and vomiting  -pain, swelling, redness or irritation at the injection site  -pain, tingling, numbness in the hands or feet  -swelling of the ankle, feet, hands  -weight gain  Side effects that usually do not require medical attention (report to your prescriber or health care professional if they continue or are bothersome):  -bone pain  -complete hair loss including hair on your head, underarms, pubic hair, eyebrows, and eyelashes  -diarrhea  -excessive tearing  -changes in the color of fingernails  -loosening of the fingernails  -nausea  -muscle pain  -red flush to skin  -sweating  -weak or tired  This list may not describe all possible side effects. Call your doctor for medical advice about side effects. You may report side effects to FDA at 1-800-FDA-1088.  Where should I keep my medicine?  This drug is given in a hospital or clinic and will not be stored at home.  NOTE: This sheet is a summary. It may not cover all possible information. If you have questions about this medicine, talk to your doctor, pharmacist, or health care provider.      2016, Elsevier/Gold Standard. (2014-12-26 16:04:57)

## 2016-05-10 ENCOUNTER — Ambulatory Visit (HOSPITAL_BASED_OUTPATIENT_CLINIC_OR_DEPARTMENT_OTHER): Payer: Medicare Other

## 2016-05-10 VITALS — BP 141/79 | HR 65 | Temp 97.9°F | Resp 18

## 2016-05-10 DIAGNOSIS — C3411 Malignant neoplasm of upper lobe, right bronchus or lung: Secondary | ICD-10-CM

## 2016-05-10 DIAGNOSIS — C3492 Malignant neoplasm of unspecified part of left bronchus or lung: Secondary | ICD-10-CM

## 2016-05-10 DIAGNOSIS — Z5189 Encounter for other specified aftercare: Secondary | ICD-10-CM | POA: Diagnosis not present

## 2016-05-10 DIAGNOSIS — K769 Liver disease, unspecified: Secondary | ICD-10-CM

## 2016-05-10 MED ORDER — PEGFILGRASTIM INJECTION 6 MG/0.6ML ~~LOC~~
6.0000 mg | PREFILLED_SYRINGE | Freq: Once | SUBCUTANEOUS | Status: AC
  Administered 2016-05-10: 6 mg via SUBCUTANEOUS
  Filled 2016-05-10: qty 0.6

## 2016-05-13 DIAGNOSIS — M542 Cervicalgia: Secondary | ICD-10-CM | POA: Diagnosis not present

## 2016-05-15 ENCOUNTER — Ambulatory Visit (INDEPENDENT_AMBULATORY_CARE_PROVIDER_SITE_OTHER): Payer: Medicare Other | Admitting: Podiatry

## 2016-05-15 ENCOUNTER — Telehealth: Payer: Self-pay

## 2016-05-15 ENCOUNTER — Encounter: Payer: Self-pay | Admitting: Podiatry

## 2016-05-15 DIAGNOSIS — M79676 Pain in unspecified toe(s): Secondary | ICD-10-CM

## 2016-05-15 DIAGNOSIS — B351 Tinea unguium: Secondary | ICD-10-CM | POA: Diagnosis not present

## 2016-05-15 NOTE — Patient Instructions (Signed)
There is a vascular (bleeding growth) on the nailbed of the right great toe. After trimming the nails I saw this area which should eventually heal itself with daily application of topical antibiotic ointment and a Band-Aid until a scab forms. While this area is healing avoid hot tub If you anysuddenpain,swelling,redness in the right great toenail area present to the emergency department or contact our office  Diabetes and Foot Care Diabetes may cause you to have problems because of poor blood supply (circulation) to your feet and legs. This may cause the skin on your feet to become thinner, break easier, and heal more slowly. Your skin may become dry, and the skin may peel and crack. You may also have nerve damage in your legs and feet causing decreased feeling in them. You may not notice minor injuries to your feet that could lead to infections or more serious problems. Taking care of your feet is one of the most important things you can do for yourself.  HOME CARE INSTRUCTIONS  Wear shoes at all times, even in the house. Do not go barefoot. Bare feet are easily injured.  Check your feet daily for blisters, cuts, and redness. If you cannot see the bottom of your feet, use a mirror or ask someone for help.  Wash your feet with warm water (do not use hot water) and mild soap. Then pat your feet and the areas between your toes until they are completely dry. Do not soak your feet as this can dry your skin.  Apply a moisturizing lotion or petroleum jelly (that does not contain alcohol and is unscented) to the skin on your feet and to dry, brittle toenails. Do not apply lotion between your toes.  Trim your toenails straight across. Do not dig under them or around the cuticle. File the edges of your nails with an emery board or nail file.  Do not cut corns or calluses or try to remove them with medicine.  Wear clean socks or stockings every day. Make sure they are not too tight. Do not wear knee-high  stockings since they may decrease blood flow to your legs.  Wear shoes that fit properly and have enough cushioning. To break in new shoes, wear them for just a few hours a day. This prevents you from injuring your feet. Always look in your shoes before you put them on to be sure there are no objects inside.  Do not cross your legs. This may decrease the blood flow to your feet.  If you find a minor scrape, cut, or break in the skin on your feet, keep it and the skin around it clean and dry. These areas may be cleansed with mild soap and water. Do not cleanse the area with peroxide, alcohol, or iodine.  When you remove an adhesive bandage, be sure not to damage the skin around it.  If you have a wound, look at it several times a day to make sure it is healing.  Do not use heating pads or hot water bottles. They may burn your skin. If you have lost feeling in your feet or legs, you may not know it is happening until it is too late.  Make sure your health care provider performs a complete foot exam at least annually or more often if you have foot problems. Report any cuts, sores, or bruises to your health care provider immediately. SEEK MEDICAL CARE IF:   You have an injury that is not healing.  You  have cuts or breaks in the skin.  You have an ingrown nail.  You notice redness on your legs or feet.  You feel burning or tingling in your legs or feet.  You have pain or cramps in your legs and feet.  Your legs or feet are numb.  Your feet always feel cold. SEEK IMMEDIATE MEDICAL CARE IF:   There is increasing redness, swelling, or pain in or around a wound.  There is a red line that goes up your leg.  Pus is coming from a wound.  You develop a fever or as directed by your health care provider.  You notice a bad smell coming from an ulcer or wound.   This information is not intended to replace advice given to you by your health care provider. Make sure you discuss any questions  you have with your health care provider.   Document Released: 12/06/2000 Document Revised: 08/11/2013 Document Reviewed: 05/18/2013 Elsevier Interactive Patient Education [employer].

## 2016-05-15 NOTE — Telephone Encounter (Signed)
I let message to call me back. I spoke to wife and pt and told her his back pain is likely secondary to Neulasta since pt had similar symptoms after last neulasta. I told her to monitor for now and to call back if unrelieved or if pain increases or  he has trouble walking.

## 2016-05-15 NOTE — Telephone Encounter (Signed)
Wife called lvm stating pt has severe pain in his shoulder tip, down his back and feels it in his legs. She said maybe this came after the neulasta shot. She said meds are not working. Last night was pretty severe.  S/w wife with pt listening in. Pt had physical therapy 1X this Monday 5/22. Pain is from base of neck to bottom of spine, it is muscle spasms, it occurred just last night. Sitting straight up would relieve it. Pt took 1 tramadol last night and claritin. Rubbing his shoulders and back did not help. Took robaxin about 0630 did not help. Sleep number bed adjustments did not help. No increased respiratory issues. No constipation or diarrhea. No bladder issues. No fever. Pt had stomach pain last night but not now.  Pt had taxotere 5/17 and neulasta 5/19. Instructed wife to give 1 tramadol now and will let Dr [name] know for further instructions.

## 2016-05-16 ENCOUNTER — Telehealth: Payer: Self-pay | Admitting: Medical Oncology

## 2016-05-16 NOTE — Progress Notes (Signed)
Patient ID: Shawn Espinoza, male   DOB: 19-Jan-1941, 75 y.o.   MRN: [id number]  Subjective: This patient presents again for scheduled visit complaining that his toenails are elongated and thickened INR comfortable walking wearing shoes and requesting nail debridement  Objective: Orientated 3 Vascular: DP and PT pulses 1/4 bilaterally Capillary reflex immediate bilaterally  Neurological: Sensation to 10 g monofilament wire intact 4/5 right and 1/5 left Vibratory sensation nonreactive bilaterally Ankle reflex reactive bilaterally  Dermatological: Atrophic skin without hair growth bilaterally The toenails are elongated, brittle, friable, incurvated and tender to direct palpation 6-10 The right hallux nail is lifting off the nailbed and after debridement of the nail plate a 3 mm vascular bleeding slightly raised. fleshy in appearance skin lesion. No Open skin lesions bilaterally  Musculoskeletal: No deformities noted Patient walks with a roller walker  Assessment: Symptomatic onychomycoses 6-10 Right hallux has subungual vascular bleeding lesion without clinical sign of infection Diabetic with peripheral neuropathy  Plan: Debrided toenails 6-10 mechanically an electrical without any bleeding Patient was advised of the bleeding lesion after debridement of the right hallux nail which was treated with topical antibiotic ointment and Band-Aid. He was advised to continue applying topical antibiotic ointment and Band-Aid until the area heals with a dry scab  Reappoint at three-month intervals or sooner if patient has concern

## 2016-05-16 NOTE — Telephone Encounter (Signed)
I returned wifes call. She reported that pt pain is better with medication . I told her to only give it as needed and to give an update tomorrow.

## 2016-05-17 ENCOUNTER — Telehealth: Payer: Self-pay

## 2016-05-17 ENCOUNTER — Other Ambulatory Visit: Payer: Self-pay | Admitting: Family Medicine

## 2016-05-17 MED ORDER — METOPROLOL TARTRATE 50 MG PO TABS: 25.0000 mg | ORAL_TABLET | Freq: Two times a day (BID) | ORAL | Status: DC

## 2016-05-17 NOTE — Telephone Encounter (Signed)
Have we restarted steroid with chemo? If so, would take glipizide with steroid.

## 2016-05-17 NOTE — Telephone Encounter (Signed)
Pts wife left v/m; last 3 days pts FBS has been high; today FBS is 209; [name] wants to know if should take Glipizide today. Pt takes chemotherapy. Pt last seen 05/06/16.[name] request cb.

## 2016-05-17 NOTE — Telephone Encounter (Signed)
Sent in metoprolol '50mg'$  to take 1/2 tab bid.  Spoke with wife. Rec glipizide prn cbg >180.

## 2016-05-17 NOTE — Telephone Encounter (Signed)
Chemo on the 17th with steroid and took glipizide at that time. Also, metoprolol on back order at [facility]. Will need substitution.

## 2016-05-20 ENCOUNTER — Other Ambulatory Visit: Payer: Self-pay | Admitting: Family Medicine

## 2016-05-29 ENCOUNTER — Ambulatory Visit (HOSPITAL_BASED_OUTPATIENT_CLINIC_OR_DEPARTMENT_OTHER): Payer: Medicare Other | Admitting: Internal Medicine

## 2016-05-29 ENCOUNTER — Ambulatory Visit (HOSPITAL_BASED_OUTPATIENT_CLINIC_OR_DEPARTMENT_OTHER): Payer: Medicare Other

## 2016-05-29 ENCOUNTER — Encounter: Payer: Self-pay | Admitting: Internal Medicine

## 2016-05-29 ENCOUNTER — Other Ambulatory Visit (HOSPITAL_BASED_OUTPATIENT_CLINIC_OR_DEPARTMENT_OTHER): Payer: Medicare Other

## 2016-05-29 ENCOUNTER — Telehealth: Payer: Self-pay | Admitting: Internal Medicine

## 2016-05-29 ENCOUNTER — Ambulatory Visit: Payer: Medicare Other

## 2016-05-29 VITALS — BP 130/67 | HR 71 | Temp 97.6°F | Resp 18 | Ht 69.0 in | Wt 161.2 lb

## 2016-05-29 DIAGNOSIS — C3492 Malignant neoplasm of unspecified part of left bronchus or lung: Secondary | ICD-10-CM

## 2016-05-29 DIAGNOSIS — K769 Liver disease, unspecified: Secondary | ICD-10-CM

## 2016-05-29 DIAGNOSIS — J90 Pleural effusion, not elsewhere classified: Secondary | ICD-10-CM | POA: Diagnosis not present

## 2016-05-29 DIAGNOSIS — C3411 Malignant neoplasm of upper lobe, right bronchus or lung: Secondary | ICD-10-CM

## 2016-05-29 DIAGNOSIS — Z5111 Encounter for antineoplastic chemotherapy: Secondary | ICD-10-CM | POA: Diagnosis present

## 2016-05-29 DIAGNOSIS — R599 Enlarged lymph nodes, unspecified: Secondary | ICD-10-CM | POA: Diagnosis not present

## 2016-05-29 DIAGNOSIS — Z8673 Personal history of transient ischemic attack (TIA), and cerebral infarction without residual deficits: Secondary | ICD-10-CM

## 2016-05-29 DIAGNOSIS — I63522 Cerebral infarction due to unspecified occlusion or stenosis of left anterior cerebral artery: Secondary | ICD-10-CM | POA: Diagnosis not present

## 2016-05-29 DIAGNOSIS — Z95828 Presence of other vascular implants and grafts: Secondary | ICD-10-CM

## 2016-05-29 DIAGNOSIS — R530 Neoplastic (malignant) related fatigue: Secondary | ICD-10-CM

## 2016-05-29 DIAGNOSIS — I4891 Unspecified atrial fibrillation: Secondary | ICD-10-CM

## 2016-05-29 LAB — CBC WITH DIFFERENTIAL/PLATELET
BASO%: 0.1 % (ref 0.0–2.0)
BASOS ABS: 0 10*3/uL (ref 0.0–0.1)
EOS ABS: 0 10*3/uL (ref 0.0–0.5)
EOS%: 0 % (ref 0.0–7.0)
HEMATOCRIT: 36.4 % — AB (ref 38.4–49.9)
HEMOGLOBIN: 12.1 g/dL — AB (ref 13.0–17.1)
LYMPH%: 3.5 % — ABNORMAL LOW (ref 14.0–49.0)
MCH: 30.6 pg (ref 27.2–33.4)
MCHC: 33.2 g/dL (ref 32.0–36.0)
MCV: 92.2 fL (ref 79.3–98.0)
MONO#: 0.3 10*3/uL (ref 0.1–0.9)
MONO%: 1.9 % (ref 0.0–14.0)
NEUT#: 14.7 10*3/uL — ABNORMAL HIGH (ref 1.5–6.5)
NEUT%: 94.5 % — ABNORMAL HIGH (ref 39.0–75.0)
Platelets: 170 10*3/uL (ref 140–400)
RBC: 3.95 10*6/uL — ABNORMAL LOW (ref 4.20–5.82)
RDW: 16.3 % — AB (ref 11.0–14.6)
WBC: 15.6 10*3/uL — ABNORMAL HIGH (ref 4.0–10.3)
lymph#: 0.6 10*3/uL — ABNORMAL LOW (ref 0.9–3.3)

## 2016-05-29 LAB — COMPREHENSIVE METABOLIC PANEL WITH GFR
ALT: 14 U/L (ref 0–55)
AST: 13 U/L (ref 5–34)
Albumin: 3.7 g/dL (ref 3.5–5.0)
Alkaline Phosphatase: 89 U/L (ref 40–150)
Anion Gap: 9 meq/L (ref 3–11)
BUN: 41.8 mg/dL — ABNORMAL HIGH (ref 7.0–26.0)
CO2: 23 meq/L (ref 22–29)
Calcium: 9.2 mg/dL (ref 8.4–10.4)
Chloride: 104 meq/L (ref 98–109)
Creatinine: 1.4 mg/dL — ABNORMAL HIGH (ref 0.7–1.3)
EGFR: 47 ml/min/1.73 m2 — ABNORMAL LOW
Glucose: 236 mg/dL — ABNORMAL HIGH (ref 70–140)
Potassium: 5.2 meq/L — ABNORMAL HIGH (ref 3.5–5.1)
Sodium: 136 meq/L (ref 136–145)
Total Bilirubin: 0.34 mg/dL (ref 0.20–1.20)
Total Protein: 6.7 g/dL (ref 6.4–8.3)

## 2016-05-29 MED ORDER — SODIUM CHLORIDE 0.9 % IV SOLN
Freq: Once | INTRAVENOUS | Status: AC
  Administered 2016-05-29: 12:00:00 via INTRAVENOUS

## 2016-05-29 MED ORDER — HEPARIN SOD (PORK) LOCK FLUSH 100 UNIT/ML IV SOLN
500.0000 [IU] | Freq: Once | INTRAVENOUS | Status: AC | PRN
  Administered 2016-05-29: 500 [IU]
  Filled 2016-05-29: qty 5

## 2016-05-29 MED ORDER — SODIUM CHLORIDE 0.9 % IJ SOLN
10.0000 mL | INTRAMUSCULAR | Status: AC | PRN
  Administered 2016-05-29: 10 mL
  Filled 2016-05-29: qty 10

## 2016-05-29 MED ORDER — SODIUM CHLORIDE 0.9% FLUSH
10.0000 mL | INTRAVENOUS | Status: DC | PRN
  Administered 2016-05-29: 10 mL
  Filled 2016-05-29: qty 10

## 2016-05-29 MED ORDER — SODIUM CHLORIDE 0.9 % IV SOLN
25.0000 mg/m2 | Freq: Once | INTRAVENOUS | Status: AC
  Administered 2016-05-29: 50 mg via INTRAVENOUS
  Filled 2016-05-29: qty 5

## 2016-05-29 MED ORDER — DEXAMETHASONE SODIUM PHOSPHATE 100 MG/10ML IJ SOLN
10.0000 mg | Freq: Once | INTRAMUSCULAR | Status: AC
  Administered 2016-05-29: 10 mg via INTRAVENOUS
  Filled 2016-05-29: qty 1

## 2016-05-29 NOTE — Telephone Encounter (Signed)
Gave pt apt & avs... Pt stated they were going to change apt on 7/5 & 7/12 at different facilities, so tx is scheduled on those day with pt approval

## 2016-05-29 NOTE — Patient Instructions (Signed)

## 2016-05-29 NOTE — Progress Notes (Signed)
[facility] [place] [phone]   Fax:([phone]) [place], [place] [phone]  DIAGNOSIS: Stage IIIB/IV non-small cell lung cancer, adenocarcinoma diagnosed in March of 2015.  PRIOR THERAPY:  1) Systemic chemotherapy with carboplatin for AUC of 5 and Alimta 500 mg/M2 every 3 weeks. First dose expected on 04/13/2014. Status post 6 cycles. 2)  Maintenance chemotherapy with single agent Alimta 500 mg/M2 every 3 weeks. First cycle on 09/06/2014. Status post 3 cycles. 3) Second course of systemic chemotherapy with carboplatin for AUC of 5 and Alimta 500 MG/M2 every 3 weeks. First cycle 11/08/2014. Status post 6 cycles. 4)  Immunotherapy with Nivolumab 3 MG/KG every 2 weeks. First dose 05/24/2015. Status post 3 cycles. 5) Systemic chemotherapy with docetaxel 60 MG/M2 and Cyramza 10 MG/KG every 3 weeks status post 6 cycles. Last dose was given 11/21/2015 with stable disease. 6) Systemic chemotherapy with docetaxel 75 MG/M2 every 3 weeks. First cycle 05/08/2016. Status post one cycle. Discontinued secondary to intolerance to the Neulasta injection.  CURRENT THERAPY: Systemic chemotherapy with docetaxel 25 MG/M2 every weekly. First cycle 05/29/2016.  INTERVAL HISTORY: Shawn Espinoza 75 y.o. male returns to the clinic today for follow up visit accompanied by his wife. The patient is feeling fine today with no specific complaints. He is currently undergoing treatment with docetaxel every 3 weeks and tolerated the first cycle except for significant fatigue and aching pain in the shoulders and muscles after the Neulasta injection. He is concerned about further chemotherapy with the Neulasta injection. He denied having any significant chest pain, shortness of breath, cough or hemoptysis. He continues to have some aching pain. He has no nausea or vomiting, no fever or chills.  MEDICAL HISTORY: Past Medical History    Diagnosis Date  . History of diabetes mellitus, type II     resolved with diet, h/o neuropathy  . Diverticulosis of colon   . Hyperlipidemia   . Hypertension   . Fatty liver 02/29/00    abd ultrasound  . Lower back pain   . Systolic murmur 3009    2Decho - normal LV fxn, EF 55%, mild AS, biatrial enlargement  . History of tobacco use quit 1990s  . 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 ARMD (age related macular degeneration) 2015    moderate Shawn Espinoza, Shawn Espinoza)  . Personal history of colonic adenomas 07/06/2013  . Hypertensive retinopathy of both eyes 2015    mild  . Dysrhythmia     Atrial fib (found 07/2014)  . Shortness of breath   . Pneumonia   . GERD (gastroesophageal reflux disease)     occasional  . Arthritis   . Neuropathy ([place])   . Constipation   . 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 Depression   . Non-small cell carcinoma of left lung ([place]) 02/2014    stage IIIb/IV on chemo  . Full code status 09/26/2015  . Atrial fibrillation (Shawn Espinoza)   . Lobar pneumonia (Shawn Espinoza) 10/18/2015  . HCAP (healthcare-associated pneumonia) 11/27/2015    IV vanc/zosyn then levaquin courses x2   . 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 Acute infarction of intestine, part and extent unspecified (Shawn Espinoza) 04/25/2016  . 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  MEDICATIONS:  Current Outpatient Prescriptions  Medication Sig Dispense Refill  . acetaminophen (TYLENOL) 500 MG tablet Take 500 mg by mouth 2 (two) times daily. Reported on 05/06/2016    . albuterol (PROVENTIL HFA;VENTOLIN HFA) 108 (90 BASE) MCG/ACT inhaler Inhale 2 puffs into the lungs every 6 (six) hours as needed for wheezing or shortness of breath. 1 Inhaler 3  . Alum & Mag  Hydroxide-Simeth (MAGIC MOUTHWASH W/LIDOCAINE) SOLN Take 5 mLs by mouth 3 (three) times daily as needed for mouth pain. 5 mL 0  . apixaban (ELIQUIS) 5 MG TABS tablet Take 1 tablet (5 mg total) by mouth 2 (two) times daily. Restart when platelets >50.    . benzonatate (TESSALON) 100 MG capsule Take 1 capsule (100 mg total) by mouth 2 (two) times daily as needed for cough. 30 capsule 0  . dexamethasone (DECADRON) 4 MG tablet 2 tab po bid, the day before, day of and day after chemotherapy every 3 weeks (Patient taking differently: Take 4 mg by mouth 2 (two) times daily. 2 tab twice daily, the day before, day of and day after chemotherapy every 3 weeks) 40 tablet 1  . diphenoxylate-atropine (LOMOTIL) 2.5-0.025 MG per tablet Take 2 tablets by mouth 4 (four) times daily as needed for diarrhea or loose stools. 30 tablet 0  . glipiZIDE (GLUCOTROL) 5 MG tablet Take 1 tablet (5 mg total) by mouth as directed. Take one daily when taking steroid (dexamethasone).    Marland Kitchen glucose blood (ONE TOUCH ULTRA TEST) test strip Use to check sugar once daily and as needed. Dx: E09.9 100 each 3  . guaiFENesin (MUCINEX) 600 MG 12 hr tablet Take 600 mg by mouth 2 (two) times daily as needed for cough or to loosen phlegm.     . hydrocortisone (ANUSOL-HC) 25 MG suppository Place 1 suppository (25 mg total) rectally 2 (two) times daily. 12 suppository 0  . KLOR-CON M10 10 MEQ tablet Take 20 mEq by mouth daily as needed (with torsemide).   10  . lidocaine-prilocaine (EMLA) cream Apply 1 application topically as needed. 30 g 1  . loratadine (CLARITIN) 10 MG tablet Take 10 mg by mouth as directed. Reported on 01/05/2016    . methocarbamol (ROBAXIN) 500 MG tablet Take 500 mg by mouth 3 (three) times daily as needed. Muscle pain    . metoprolol tartrate (LOPRESSOR) 50 MG tablet Take 0.5 tablets (25 mg total) by mouth 2 (two) times daily. 30 tablet 6  . Multiple Vitamins-Minerals (ICAPS) CAPS Take 1 capsule by mouth 2 (two) times daily.       [name] DELICA LANCETS 42H MISC Use to check sugar once daily and as needed. Dx:E09.9 100 each 3  . polyethylene glycol (MIRALAX / GLYCOLAX) packet Take 8.5 g by mouth daily as needed for moderate constipation.     . potassium chloride (K-DUR) 10 MEQ tablet Take two tablets daily as needed with Torsemide. 180 tablet 3  . spironolactone (ALDACTONE) 50 MG tablet TAKE ONE TABLET BY MOUTH ONCE DAILY 30 tablet 6  . sucralfate (CARAFATE) 1 g tablet Take 1 tablet (1 g total) by mouth 3 (three) times daily before meals. 90 tablet 1  . torsemide (DEMADEX) 20 MG tablet Take 20 mg by mouth 2 (two) times daily. Takes one tablet daily and if swelling continues he takes a second pill    . traMADol (ULTRAM) 50 MG tablet Take 1 tablet (50 mg total) by mouth every 6 (  six) hours as needed. 30 tablet 3  . triamcinolone cream (KENALOG) 0.1 % Apply 1 application topically 2 (two) times daily. Apply to AA. 60 g 0   No current facility-administered medications for this visit.    SURGICAL HISTORY:  Past Surgical History  Procedure Laterality Date  . Colonoscopy  2004  . Colonoscopy  2014    tubular adenoma x1, mod diverticulosis ([place])  . Flexible bronchoscopy  02/2014    WNL  . Video bronchoscopy Bilateral 03/17/2014    Procedure: VIDEO BRONCHOSCOPY WITH FLUORO;  Surgeon: Tanda Rockers, MD;  Location: [facility];  Service: Cardiopulmonary;  Laterality: Bilateral;  . Chest tube insertion Left 08/26/2014    Procedure: INSERTION OF LEFT  PLEURAL DRAINAGE CATHETER;  Surgeon: Ivin Poot, MD;  Location: [place] [facility];  Service: Thoracic;  Laterality: Left;  . Cataract extraction Right 03/2015    Dr Shawn Espinoza  . Portacath placement Right 03/31/2015    Procedure: INSERTION PORT-A-CATH;  Surgeon: Ivin Poot, MD;  Location: [place];  Service: Thoracic;  Laterality: Right;  . Removal of pleural drainage catheter Left 03/31/2015    Procedure: REMOVAL OF PLEURAL DRAINAGE CATHETER;  Surgeon: Ivin Poot, MD;  Location:  [place];  Service: Thoracic;  Laterality: Left;    REVIEW OF SYSTEMS:  Constitutional: positive for fatigue Eyes: negative Ears, nose, mouth, throat, and face: negative Respiratory: negative Cardiovascular: negative Gastrointestinal: negative Genitourinary:negative Integument/breast: negative Hematologic/lymphatic: negative Musculoskeletal:positive for arthralgias Neurological: negative Behavioral/Psych: negative Endocrine: negative Allergic/Immunologic: negative   PHYSICAL EXAMINATION: General appearance: alert, cooperative, fatigued and no distress Head: Normocephalic, without obvious abnormality, atraumatic Neck: no adenopathy, no JVD, supple, symmetrical, trachea midline and thyroid not enlarged, symmetric, no tenderness/mass/nodules Lymph nodes: Cervical, supraclavicular, and axillary nodes normal. Resp: diminished breath sounds LLL and dullness to percussion LLL Back: symmetric, no curvature. ROM normal. No CVA tenderness. Cardio: regular rate and rhythm, S1, S2 normal, no murmur, click, rub or gallop GI: soft, non-tender; bowel sounds normal; no masses,  no organomegaly Extremities: edema 2+ Neurologic: Alert and oriented X 3, normal strength and tone. Normal symmetric reflexes. Normal coordination and gait  ECOG PERFORMANCE STATUS: 1 - Symptomatic but completely ambulatory  There were no vitals taken for this visit.  LABORATORY DATA: Lab Results  Component Value Date   WBC 15.6* 05/29/2016   HGB 12.1* 05/29/2016   HCT 36.4* 05/29/2016   MCV 92.2 05/29/2016   PLT 170 05/29/2016      Chemistry      Component Value Date/Time   NA 135* 05/08/2016 1003   NA 139 02/13/2016 1405   K 5.0 05/08/2016 1003   K 5.1 02/13/2016 1405   CL 103 02/13/2016 1405   CO2 21* 05/08/2016 1003   CO2 29 02/13/2016 1405   BUN 49.9* 05/08/2016 1003   BUN 27* 02/13/2016 1405   CREATININE 1.4* 05/08/2016 1003   CREATININE 1.11 02/13/2016 1405   CREATININE 0.83 02/22/2014 1314       Component Value Date/Time   CALCIUM 9.0 05/08/2016 1003   CALCIUM 9.2 02/13/2016 1405   ALKPHOS 79 05/08/2016 1003   ALKPHOS 57 02/13/2016 1405   AST 10 05/08/2016 1003   AST 16 02/13/2016 1405   ALT 15 05/08/2016 1003   ALT 12 02/13/2016 1405   BILITOT 0.31 05/08/2016 1003   BILITOT 0.5 02/13/2016 1405       RADIOGRAPHIC STUDIES: No results found. ASSESSMENT AND PLAN: this is a very pleasant 75 years old white male with:  1)  Stage IV non-small cell lung cancer, adenocarcinoma presenting with large right upper lobe lung mass in addition to mediastinal and bilateral hilar lymphadenopathy as well as cervical lymphadenopathy and left pleural effusion. The patient completed a course systemic chemotherapy with carboplatin and Alimta status post 6 cycles. This was followed by 3 cycles of maintenance chemotherapy with single agent Alimta. The patient tolerated his treatment well. He had evidence for disease progression and the patient was started on treatment again with carboplatin and Alimta status post 6 cycles. He was treated with 4 cycles of immunotherapy with Nivolumab discontinued secondary to disease progression. The patient is currently on systemic chemotherapy with docetaxel and Cyramza status post 6 cycles and tolerated the treatment well except for fatigue and feels admission with recurrent pneumonia.  He has been on observation for the last 6 months. Unfortunately the recent CT scan of the chest, abdomen and pelvis showed evidence for disease progression with increase in the mediastinal lymphadenopathy as well as increase in size of retrocrural lymph node and gastrohepatic lymphadenopathy. He was started on systemic chemotherapy with docetaxel 75 MG/M2 every 3 weeks with Neulasta support. The patient tolerated the first cycle well except for the significant pain and arthralgias with the Neulasta injection and he doesn't think that he can continue with the same regimen including the  Neulasta injection. I discussed with the patient the option of changing docetaxel to 25 MG/M2 on weekly basis with no need for Neulasta injection. He'll need to this option and he will start the first dose of his treatment today.  2) Atrial fibrillation: continue Eliquis.  3) lower extremity edema: Improved. Continue Demadex.  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 I contacted his primary care physician office for management of this condition since they ordered the MRI of the brain. I recommended for the patient to continue his current treatment to his Eliquis and advise him to start taking aspirin daily until he sees a neurologist.  5) CODE STATUS: Discussed again with the patient and his wife. He would like initial resuscitation but no prolonged support Carafate) vegetative state. He would come back for follow-up visit in 3 weeks.  He was advised to call immediately if he has any concerning symptoms in the interval.  The patient voices understanding of current disease status and treatment options and is in agreement with the current care plan.  All questions were answered. The patient knows to call the clinic with any problems, questions or concerns. We can certainly see the patient much sooner if necessary.  Disclaimer: This note was dictated with voice recognition software. Similar sounding words can inadvertently be transcribed and may not be corrected upon review.

## 2016-05-29 NOTE — Patient Instructions (Signed)
[facility] Discharge Instructions for Patients Receiving Chemotherapy  Today you received the following chemotherapy agents:  Taxotere.  To help prevent nausea and vomiting after your treatment, we encourage you to take your nausea medication as prescribed.   If you develop nausea and vomiting that is not controlled by your nausea medication, call the clinic.   BELOW ARE SYMPTOMS THAT SHOULD BE REPORTED IMMEDIATELY:  *FEVER GREATER THAN 100.5 F  *CHILLS WITH OR WITHOUT FEVER  NAUSEA AND VOMITING THAT IS NOT CONTROLLED WITH YOUR NAUSEA MEDICATION  *UNUSUAL SHORTNESS OF BREATH  *UNUSUAL BRUISING OR BLEEDING  TENDERNESS IN MOUTH AND THROAT WITH OR WITHOUT PRESENCE OF ULCERS  *URINARY PROBLEMS  *BOWEL PROBLEMS  UNUSUAL RASH Items with * indicate a potential emergency and should be followed up as soon as possible.  Feel free to call the clinic you have any questions or concerns. The clinic phone number is [phone].  Please show the [facility] at check-in to the Emergency Department and triage nurse.

## 2016-05-31 ENCOUNTER — Ambulatory Visit: Payer: Medicare Other

## 2016-05-31 ENCOUNTER — Telehealth: Payer: Self-pay | Admitting: Internal Medicine

## 2016-05-31 NOTE — Telephone Encounter (Signed)
returned call and lvm to call back to r/s appts °

## 2016-05-31 NOTE — Telephone Encounter (Signed)
pt wife called to cx inj done....they were told by Dr. Tyrell Antonio that it was not needed

## 2016-06-05 ENCOUNTER — Ambulatory Visit (HOSPITAL_BASED_OUTPATIENT_CLINIC_OR_DEPARTMENT_OTHER): Payer: Medicare Other

## 2016-06-05 ENCOUNTER — Other Ambulatory Visit (HOSPITAL_BASED_OUTPATIENT_CLINIC_OR_DEPARTMENT_OTHER): Payer: Medicare Other

## 2016-06-05 ENCOUNTER — Ambulatory Visit: Payer: Medicare Other

## 2016-06-05 VITALS — BP 163/79 | HR 72 | Temp 98.0°F | Resp 18

## 2016-06-05 DIAGNOSIS — Z95828 Presence of other vascular implants and grafts: Secondary | ICD-10-CM

## 2016-06-05 DIAGNOSIS — K769 Liver disease, unspecified: Secondary | ICD-10-CM

## 2016-06-05 DIAGNOSIS — Z5111 Encounter for antineoplastic chemotherapy: Secondary | ICD-10-CM

## 2016-06-05 DIAGNOSIS — C3411 Malignant neoplasm of upper lobe, right bronchus or lung: Secondary | ICD-10-CM

## 2016-06-05 DIAGNOSIS — C3492 Malignant neoplasm of unspecified part of left bronchus or lung: Secondary | ICD-10-CM

## 2016-06-05 LAB — CBC WITH DIFFERENTIAL/PLATELET
BASO%: 1.3 % (ref 0.0–2.0)
Basophils Absolute: 0.1 10*3/uL (ref 0.0–0.1)
EOS ABS: 0.1 10*3/uL (ref 0.0–0.5)
EOS%: 1 % (ref 0.0–7.0)
HCT: 38.5 % (ref 38.4–49.9)
HGB: 12.8 g/dL — ABNORMAL LOW (ref 13.0–17.1)
LYMPH#: 0.7 10*3/uL — AB (ref 0.9–3.3)
LYMPH%: 9.1 % — AB (ref 14.0–49.0)
MCH: 30.6 pg (ref 27.2–33.4)
MCHC: 33.3 g/dL (ref 32.0–36.0)
MCV: 92 fL (ref 79.3–98.0)
MONO#: 0.6 10*3/uL (ref 0.1–0.9)
MONO%: 7.4 % (ref 0.0–14.0)
NEUT%: 81.2 % — ABNORMAL HIGH (ref 39.0–75.0)
NEUTROS ABS: 6 10*3/uL (ref 1.5–6.5)
Platelets: 139 10*3/uL — ABNORMAL LOW (ref 140–400)
RBC: 4.18 10*6/uL — AB (ref 4.20–5.82)
RDW: 16.7 % — AB (ref 11.0–14.6)
WBC: 7.4 10*3/uL (ref 4.0–10.3)

## 2016-06-05 LAB — COMPREHENSIVE METABOLIC PANEL
ALT: 24 U/L (ref 0–55)
AST: 21 U/L (ref 5–34)
Albumin: 3.6 g/dL (ref 3.5–5.0)
Alkaline Phosphatase: 88 U/L (ref 40–150)
Anion Gap: 10 mEq/L (ref 3–11)
BUN: 53.4 mg/dL — ABNORMAL HIGH (ref 7.0–26.0)
CHLORIDE: 103 meq/L (ref 98–109)
CO2: 25 meq/L (ref 22–29)
Calcium: 9.2 mg/dL (ref 8.4–10.4)
Creatinine: 1.5 mg/dL — ABNORMAL HIGH (ref 0.7–1.3)
EGFR: 43 mL/min/{1.73_m2} — AB (ref >90)
GLUCOSE: 236 mg/dL — AB (ref 70–140)
POTASSIUM: 4.7 meq/L (ref 3.5–5.1)
SODIUM: 137 meq/L (ref 136–145)
Total Bilirubin: 0.84 mg/dL (ref 0.20–1.20)
Total Protein: 6.7 g/dL (ref 6.4–8.3)

## 2016-06-05 MED ORDER — SODIUM CHLORIDE 0.9 % IV SOLN
Freq: Once | INTRAVENOUS | Status: AC
  Administered 2016-06-05: 12:00:00 via INTRAVENOUS

## 2016-06-05 MED ORDER — SODIUM CHLORIDE 0.9 % IV SOLN
25.0000 mg/m2 | Freq: Once | INTRAVENOUS | Status: AC
  Administered 2016-06-05: 50 mg via INTRAVENOUS
  Filled 2016-06-05: qty 5

## 2016-06-05 MED ORDER — SODIUM CHLORIDE 0.9% FLUSH
10.0000 mL | INTRAVENOUS | Status: DC | PRN
  Administered 2016-06-05: 10 mL
  Filled 2016-06-05: qty 10

## 2016-06-05 MED ORDER — HEPARIN SOD (PORK) LOCK FLUSH 100 UNIT/ML IV SOLN
500.0000 [IU] | Freq: Once | INTRAVENOUS | Status: AC | PRN
  Administered 2016-06-05: 500 [IU]
  Filled 2016-06-05: qty 5

## 2016-06-05 MED ORDER — SODIUM CHLORIDE 0.9 % IV SOLN
10.0000 mg | Freq: Once | INTRAVENOUS | Status: AC
  Administered 2016-06-05: 10 mg via INTRAVENOUS
  Filled 2016-06-05: qty 1

## 2016-06-05 MED ORDER — SODIUM CHLORIDE 0.9 % IJ SOLN
10.0000 mL | INTRAMUSCULAR | Status: AC | PRN
  Administered 2016-06-05: 10 mL
  Filled 2016-06-05: qty 10

## 2016-06-05 NOTE — Patient Instructions (Signed)
[facility] Discharge Instructions for Patients Receiving Chemotherapy  Today you received the following chemotherapy agents:  Taxotere.  To help prevent nausea and vomiting after your treatment, we encourage you to take your nausea medication as prescribed.   If you develop nausea and vomiting that is not controlled by your nausea medication, call the clinic.   BELOW ARE SYMPTOMS THAT SHOULD BE REPORTED IMMEDIATELY:  *FEVER GREATER THAN 100.5 F  *CHILLS WITH OR WITHOUT FEVER  NAUSEA AND VOMITING THAT IS NOT CONTROLLED WITH YOUR NAUSEA MEDICATION  *UNUSUAL SHORTNESS OF BREATH  *UNUSUAL BRUISING OR BLEEDING  TENDERNESS IN MOUTH AND THROAT WITH OR WITHOUT PRESENCE OF ULCERS  *URINARY PROBLEMS  *BOWEL PROBLEMS  UNUSUAL RASH Items with * indicate a potential emergency and should be followed up as soon as possible.  Feel free to call the clinic you have any questions or concerns. The clinic phone number is [phone].  Please show the [facility] at check-in to the Emergency Department and triage nurse.

## 2016-06-05 NOTE — Patient Instructions (Signed)

## 2016-06-06 ENCOUNTER — Telehealth: Payer: Self-pay

## 2016-06-06 NOTE — Telephone Encounter (Signed)
Current torsemide dose is '20mg'$  daily with extra '20mg'$  prn leg swelling. How is leg swelling and weight? If stable with normal blood pressures could try halving torsemide dose each time. Otherwise recommend increasing water intake by 8 oz daily.

## 2016-06-06 NOTE — Telephone Encounter (Signed)
Spoke with patient's wife and she said weights, swelling and BP's have all been very stable. She will cut torsemide in half and go from there. She will call me back if any issues.

## 2016-06-06 NOTE — Telephone Encounter (Signed)
Shawn Espinoza (DPR signed) left v/m; pt had chemotherapy on 06/05/16 and nurse advised pt might be dehydrated; pt already drinks 64 oz of fluid daily. Pt wants to know if fluid pills should be decreased or should pt drink more than 64 oz a day. Shawn Espinoza request cb. Pt last seen 05/06/16.

## 2016-06-12 ENCOUNTER — Ambulatory Visit (HOSPITAL_BASED_OUTPATIENT_CLINIC_OR_DEPARTMENT_OTHER): Payer: Medicare Other

## 2016-06-12 ENCOUNTER — Other Ambulatory Visit (HOSPITAL_BASED_OUTPATIENT_CLINIC_OR_DEPARTMENT_OTHER): Payer: Medicare Other

## 2016-06-12 ENCOUNTER — Ambulatory Visit: Payer: Medicare Other

## 2016-06-12 VITALS — BP 123/74 | HR 72 | Temp 98.3°F | Resp 20

## 2016-06-12 DIAGNOSIS — Z5111 Encounter for antineoplastic chemotherapy: Secondary | ICD-10-CM

## 2016-06-12 DIAGNOSIS — D709 Neutropenia, unspecified: Secondary | ICD-10-CM

## 2016-06-12 DIAGNOSIS — C3411 Malignant neoplasm of upper lobe, right bronchus or lung: Secondary | ICD-10-CM

## 2016-06-12 DIAGNOSIS — C3492 Malignant neoplasm of unspecified part of left bronchus or lung: Secondary | ICD-10-CM

## 2016-06-12 DIAGNOSIS — K769 Liver disease, unspecified: Secondary | ICD-10-CM

## 2016-06-12 DIAGNOSIS — D61818 Other pancytopenia: Secondary | ICD-10-CM

## 2016-06-12 DIAGNOSIS — Z95828 Presence of other vascular implants and grafts: Secondary | ICD-10-CM

## 2016-06-12 LAB — CBC WITH DIFFERENTIAL/PLATELET
BASO%: 1.3 % (ref 0.0–2.0)
BASOS ABS: 0.1 10*3/uL (ref 0.0–0.1)
EOS ABS: 0.1 10*3/uL (ref 0.0–0.5)
EOS%: 1.4 % (ref 0.0–7.0)
HCT: 35.9 % — ABNORMAL LOW (ref 38.4–49.9)
HGB: 12.1 g/dL — ABNORMAL LOW (ref 13.0–17.1)
LYMPH%: 11.8 % — AB (ref 14.0–49.0)
MCH: 31 pg (ref 27.2–33.4)
MCHC: 33.6 g/dL (ref 32.0–36.0)
MCV: 92.3 fL (ref 79.3–98.0)
MONO#: 0.4 10*3/uL (ref 0.1–0.9)
MONO%: 8.4 % (ref 0.0–14.0)
NEUT%: 77.1 % — ABNORMAL HIGH (ref 39.0–75.0)
NEUTROS ABS: 3.9 10*3/uL (ref 1.5–6.5)
Platelets: 139 10*3/uL — ABNORMAL LOW (ref 140–400)
RBC: 3.89 10*6/uL — AB (ref 4.20–5.82)
RDW: 17.6 % — ABNORMAL HIGH (ref 11.0–14.6)
WBC: 5.1 10*3/uL (ref 4.0–10.3)
lymph#: 0.6 10*3/uL — ABNORMAL LOW (ref 0.9–3.3)

## 2016-06-12 LAB — COMPREHENSIVE METABOLIC PANEL
ALT: 25 U/L (ref 0–55)
AST: 20 U/L (ref 5–34)
Albumin: 3.4 g/dL — ABNORMAL LOW (ref 3.5–5.0)
Alkaline Phosphatase: 77 U/L (ref 40–150)
Anion Gap: 8 mEq/L (ref 3–11)
BILIRUBIN TOTAL: 0.77 mg/dL (ref 0.20–1.20)
BUN: 30.9 mg/dL — AB (ref 7.0–26.0)
CHLORIDE: 107 meq/L (ref 98–109)
CO2: 24 mEq/L (ref 22–29)
CREATININE: 1.2 mg/dL (ref 0.7–1.3)
Calcium: 9 mg/dL (ref 8.4–10.4)
EGFR: 59 mL/min/{1.73_m2} — ABNORMAL LOW (ref >90)
GLUCOSE: 166 mg/dL — AB (ref 70–140)
Potassium: 5 mEq/L (ref 3.5–5.1)
SODIUM: 139 meq/L (ref 136–145)
TOTAL PROTEIN: 6.3 g/dL — AB (ref 6.4–8.3)

## 2016-06-12 MED ORDER — HEPARIN SOD (PORK) LOCK FLUSH 100 UNIT/ML IV SOLN
500.0000 [IU] | Freq: Once | INTRAVENOUS | Status: AC | PRN
  Administered 2016-06-12: 500 [IU]
  Filled 2016-06-12: qty 5

## 2016-06-12 MED ORDER — SODIUM CHLORIDE 0.9 % IV SOLN
25.0000 mg/m2 | Freq: Once | INTRAVENOUS | Status: AC
  Administered 2016-06-12: 50 mg via INTRAVENOUS
  Filled 2016-06-12: qty 5

## 2016-06-12 MED ORDER — SODIUM CHLORIDE 0.9% FLUSH
10.0000 mL | INTRAVENOUS | Status: DC | PRN
  Administered 2016-06-12: 10 mL
  Filled 2016-06-12: qty 10

## 2016-06-12 MED ORDER — SODIUM CHLORIDE 0.9 % IV SOLN
10.0000 mg | Freq: Once | INTRAVENOUS | Status: AC
  Administered 2016-06-12: 10 mg via INTRAVENOUS
  Filled 2016-06-12: qty 1

## 2016-06-12 MED ORDER — SODIUM CHLORIDE 0.9 % IJ SOLN
10.0000 mL | INTRAMUSCULAR | Status: AC | PRN
  Administered 2016-06-12: 10 mL
  Filled 2016-06-12: qty 10

## 2016-06-12 MED ORDER — SODIUM CHLORIDE 0.9 % IV SOLN
Freq: Once | INTRAVENOUS | Status: AC
  Administered 2016-06-12: 14:00:00 via INTRAVENOUS

## 2016-06-12 NOTE — Patient Instructions (Signed)

## 2016-06-12 NOTE — Patient Instructions (Signed)
Docetaxel injection  What is this medicine?  DOCETAXEL (doe se TAX el) is a chemotherapy drug. It targets fast dividing cells, like cancer cells, and causes these cells to die. This medicine is used to treat many types of cancers like breast cancer, certain stomach cancers, head and neck cancer, lung cancer, and prostate cancer.  This medicine may be used for other purposes; ask your health care provider or pharmacist if you have questions.  What should I tell my health care provider before I take this medicine?  They need to know if you have any of these conditions:  -infection (especially a virus infection such as chickenpox, cold sores, or herpes)  -liver disease  -low blood counts, like low white cell, platelet, or red cell counts  -an unusual or allergic reaction to docetaxel, polysorbate 80, other chemotherapy agents, other medicines, foods, dyes, or preservatives  -pregnant or trying to get pregnant  -breast-feeding  How should I use this medicine?  This drug is given as an infusion into a vein. It is administered in a hospital or clinic by a specially trained health care professional.  Talk to your pediatrician regarding the use of this medicine in children. Special care may be needed.  Overdosage: If you think you have taken too much of this medicine contact a poison control center or emergency room at once.  NOTE: This medicine is only for you. Do not share this medicine with others.  What if I miss a dose?  It is important not to miss your dose. Call your doctor or health care professional if you are unable to keep an appointment.  What may interact with this medicine?  -cyclosporine  -erythromycin  -ketoconazole  -medicines to increase blood counts like filgrastim, pegfilgrastim, sargramostim  -vaccines  Talk to your doctor or health care professional before taking any of these medicines:  -acetaminophen  -aspirin  -ibuprofen  -ketoprofen  -naproxen  This list may not describe all possible interactions.  Give your health care provider a list of all the medicines, herbs, non-prescription drugs, or dietary supplements you use. Also tell them if you smoke, drink alcohol, or use illegal drugs. Some items may interact with your medicine.  What should I watch for while using this medicine?  Your condition will be monitored carefully while you are receiving this medicine. You will need important blood work done while you are taking this medicine.  This drug may make you feel generally unwell. This is not uncommon, as chemotherapy can affect healthy cells as well as cancer cells. Report any side effects. Continue your course of treatment even though you feel ill unless your doctor tells you to stop.  In some cases, you may be given additional medicines to help with side effects. Follow all directions for their use.  Call your doctor or health care professional for advice if you get a fever, chills or sore throat, or other symptoms of a cold or flu. Do not treat yourself. This drug decreases your body's ability to fight infections. Try to avoid being around people who are sick.  This medicine may increase your risk to bruise or bleed. Call your doctor or health care professional if you notice any unusual bleeding.  This medicine may contain alcohol in the product. You may get drowsy or dizzy. Do not drive, use machinery, or do anything that needs mental alertness until you know how this medicine affects you. Do not stand or sit up quickly, especially if you are an older   patient. This reduces the risk of dizzy or fainting spells. Avoid alcoholic drinks.  Do not become pregnant while taking this medicine. Women should inform their doctor if they wish to become pregnant or think they might be pregnant. There is a potential for serious side effects to an unborn child. Talk to your health care professional or pharmacist for more information. Do not breast-feed an infant while taking this medicine.  What side effects may I notice  from receiving this medicine?  Side effects that you should report to your doctor or health care professional as soon as possible:  -allergic reactions like skin rash, itching or hives, swelling of the face, lips, or tongue  -low blood counts - This drug may decrease the number of white blood cells, red blood cells and platelets. You may be at increased risk for infections and bleeding.  -signs of infection - fever or chills, cough, sore throat, pain or difficulty passing urine  -signs of decreased platelets or bleeding - bruising, pinpoint red spots on the skin, black, tarry stools, nosebleeds  -signs of decreased red blood cells - unusually weak or tired, fainting spells, lightheadedness  -breathing problems  -fast or irregular heartbeat  -low blood pressure  -mouth sores  -nausea and vomiting  -pain, swelling, redness or irritation at the injection site  -pain, tingling, numbness in the hands or feet  -swelling of the ankle, feet, hands  -weight gain  Side effects that usually do not require medical attention (report to your prescriber or health care professional if they continue or are bothersome):  -bone pain  -complete hair loss including hair on your head, underarms, pubic hair, eyebrows, and eyelashes  -diarrhea  -excessive tearing  -changes in the color of fingernails  -loosening of the fingernails  -nausea  -muscle pain  -red flush to skin  -sweating  -weak or tired  This list may not describe all possible side effects. Call your doctor for medical advice about side effects. You may report side effects to FDA at 1-800-FDA-1088.  Where should I keep my medicine?  This drug is given in a hospital or clinic and will not be stored at home.  NOTE: This sheet is a summary. It may not cover all possible information. If you have questions about this medicine, talk to your doctor, pharmacist, or health care provider.      2016, Elsevier/Gold Standard. (2014-12-26 16:04:57)

## 2016-06-19 ENCOUNTER — Telehealth: Payer: Self-pay | Admitting: Internal Medicine

## 2016-06-19 ENCOUNTER — Other Ambulatory Visit (HOSPITAL_BASED_OUTPATIENT_CLINIC_OR_DEPARTMENT_OTHER): Payer: Medicare Other

## 2016-06-19 ENCOUNTER — Ambulatory Visit (HOSPITAL_BASED_OUTPATIENT_CLINIC_OR_DEPARTMENT_OTHER): Payer: Medicare Other

## 2016-06-19 ENCOUNTER — Encounter: Payer: Self-pay | Admitting: Internal Medicine

## 2016-06-19 ENCOUNTER — Ambulatory Visit: Payer: Medicare Other

## 2016-06-19 ENCOUNTER — Ambulatory Visit (HOSPITAL_BASED_OUTPATIENT_CLINIC_OR_DEPARTMENT_OTHER): Payer: Medicare Other | Admitting: Internal Medicine

## 2016-06-19 VITALS — BP 125/73 | HR 73 | Temp 98.3°F | Resp 18 | Ht 69.0 in | Wt 166.0 lb

## 2016-06-19 DIAGNOSIS — J90 Pleural effusion, not elsewhere classified: Secondary | ICD-10-CM

## 2016-06-19 DIAGNOSIS — Z5111 Encounter for antineoplastic chemotherapy: Secondary | ICD-10-CM | POA: Diagnosis not present

## 2016-06-19 DIAGNOSIS — C3492 Malignant neoplasm of unspecified part of left bronchus or lung: Secondary | ICD-10-CM

## 2016-06-19 DIAGNOSIS — I4891 Unspecified atrial fibrillation: Secondary | ICD-10-CM

## 2016-06-19 DIAGNOSIS — C3411 Malignant neoplasm of upper lobe, right bronchus or lung: Secondary | ICD-10-CM

## 2016-06-19 DIAGNOSIS — R609 Edema, unspecified: Secondary | ICD-10-CM

## 2016-06-19 DIAGNOSIS — I63522 Cerebral infarction due to unspecified occlusion or stenosis of left anterior cerebral artery: Secondary | ICD-10-CM | POA: Diagnosis not present

## 2016-06-19 DIAGNOSIS — K769 Liver disease, unspecified: Secondary | ICD-10-CM

## 2016-06-19 DIAGNOSIS — Z95828 Presence of other vascular implants and grafts: Secondary | ICD-10-CM

## 2016-06-19 LAB — COMPREHENSIVE METABOLIC PANEL
ALBUMIN: 3.1 g/dL — AB (ref 3.5–5.0)
ALK PHOS: 85 U/L (ref 40–150)
ALT: 21 U/L (ref 0–55)
AST: 20 U/L (ref 5–34)
Anion Gap: 9 mEq/L (ref 3–11)
BUN: 31.7 mg/dL — AB (ref 7.0–26.0)
CALCIUM: 8.4 mg/dL (ref 8.4–10.4)
CO2: 23 mEq/L (ref 22–29)
CREATININE: 1.1 mg/dL (ref 0.7–1.3)
Chloride: 109 mEq/L (ref 98–109)
EGFR: 62 mL/min/{1.73_m2} — ABNORMAL LOW (ref >90)
Glucose: 175 mg/dl — ABNORMAL HIGH (ref 70–140)
POTASSIUM: 4.5 meq/L (ref 3.5–5.1)
Sodium: 141 mEq/L (ref 136–145)
Total Bilirubin: 0.53 mg/dL (ref 0.20–1.20)
Total Protein: 5.9 g/dL — ABNORMAL LOW (ref 6.4–8.3)

## 2016-06-19 LAB — CBC WITH DIFFERENTIAL/PLATELET
BASO%: 1 % (ref 0.0–2.0)
BASOS ABS: 0 10*3/uL (ref 0.0–0.1)
EOS ABS: 0 10*3/uL (ref 0.0–0.5)
EOS%: 0.6 % (ref 0.0–7.0)
HEMATOCRIT: 35.7 % — AB (ref 38.4–49.9)
HEMOGLOBIN: 11.8 g/dL — AB (ref 13.0–17.1)
LYMPH#: 0.5 10*3/uL — AB (ref 0.9–3.3)
LYMPH%: 11.7 % — ABNORMAL LOW (ref 14.0–49.0)
MCH: 31.1 pg (ref 27.2–33.4)
MCHC: 33.2 g/dL (ref 32.0–36.0)
MCV: 93.9 fL (ref 79.3–98.0)
MONO#: 0.4 10*3/uL (ref 0.1–0.9)
MONO%: 9.9 % (ref 0.0–14.0)
NEUT#: 3.5 10*3/uL (ref 1.5–6.5)
NEUT%: 76.8 % — AB (ref 39.0–75.0)
PLATELETS: 139 10*3/uL — AB (ref 140–400)
RBC: 3.8 10*6/uL — ABNORMAL LOW (ref 4.20–5.82)
RDW: 17.5 % — AB (ref 11.0–14.6)
WBC: 4.5 10*3/uL (ref 4.0–10.3)

## 2016-06-19 MED ORDER — DOCETAXEL CHEMO INJECTION 160 MG/16ML
25.0000 mg/m2 | Freq: Once | INTRAVENOUS | Status: AC
  Administered 2016-06-19: 50 mg via INTRAVENOUS
  Filled 2016-06-19: qty 5

## 2016-06-19 MED ORDER — SODIUM CHLORIDE 0.9 % IJ SOLN
10.0000 mL | INTRAMUSCULAR | Status: AC | PRN
  Administered 2016-06-19: 10 mL
  Filled 2016-06-19: qty 10

## 2016-06-19 MED ORDER — HEPARIN SOD (PORK) LOCK FLUSH 100 UNIT/ML IV SOLN
500.0000 [IU] | Freq: Once | INTRAVENOUS | Status: AC | PRN
  Administered 2016-06-19: 500 [IU]
  Filled 2016-06-19: qty 5

## 2016-06-19 MED ORDER — SODIUM CHLORIDE 0.9% FLUSH
10.0000 mL | INTRAVENOUS | Status: DC | PRN
  Administered 2016-06-19: 10 mL
  Filled 2016-06-19: qty 10

## 2016-06-19 MED ORDER — SODIUM CHLORIDE 0.9 % IV SOLN
Freq: Once | INTRAVENOUS | Status: AC
  Administered 2016-06-19: 11:00:00 via INTRAVENOUS

## 2016-06-19 MED ORDER — SODIUM CHLORIDE 0.9 % IV SOLN
10.0000 mg | Freq: Once | INTRAVENOUS | Status: AC
  Administered 2016-06-19: 10 mg via INTRAVENOUS
  Filled 2016-06-19: qty 1

## 2016-06-19 NOTE — Progress Notes (Signed)
[facility] [phone]   Fax:([phone]) [place], [place] [address]  DIAGNOSIS: Stage IIIB/IV non-small cell lung cancer, adenocarcinoma diagnosed in March of 2015.  PRIOR THERAPY:  1) Systemic chemotherapy with carboplatin for AUC of 5 and Alimta 500 mg/M2 every 3 weeks. First dose expected on 04/13/2014. Status post 6 cycles. 2)  Maintenance chemotherapy with single agent Alimta 500 mg/M2 every 3 weeks. First cycle on 09/06/2014. Status post 3 cycles. 3) Second course of systemic chemotherapy with carboplatin for AUC of 5 and Alimta 500 MG/M2 every 3 weeks. First cycle 11/08/2014. Status post 6 cycles. 4)  Immunotherapy with Nivolumab 3 MG/KG every 2 weeks. First dose 05/24/2015. Status post 3 cycles. 5) Systemic chemotherapy with docetaxel 60 MG/M2 and Cyramza 10 MG/KG every 3 weeks status post 6 cycles. Last dose was given 11/21/2015 with stable disease. 6) Systemic chemotherapy with docetaxel 75 MG/M2 every 3 weeks. First cycle 05/08/2016. Status post one cycle. Discontinued secondary to intolerance to the Neulasta injection.  CURRENT THERAPY: Systemic chemotherapy with docetaxel 25 MG/M2 every weekly. First cycle 05/29/2016. Status post 3 cycles.  INTERVAL HISTORY: Shawn Espinoza 75 y.o. male returns to the clinic today for follow up visit accompanied by his wife. The patient is feeling fine today with no specific complaints. He could not tolerate the Neulasta injection with the 3 weekly schedule of docetaxel. He was switched to weekly doses of docetaxel and tolerating it much better. He continues to have mild shoulder pain. He denied having any significant chest pain, shortness of breath, cough or hemoptysis. He has no nausea or vomiting, no fever or chills. He is here today to start cycle #4 of his weekly docetaxel.  MEDICAL HISTORY: Past Medical History  Diagnosis Date  . History  of diabetes mellitus, type II     resolved with diet, h/o neuropathy  . Diverticulosis of colon   . Hyperlipidemia   . Hypertension   . Fatty liver 02/29/00    abd ultrasound  . Lower back pain   . Systolic murmur 0347    2Decho - normal LV fxn, EF 55%, mild AS, biatrial enlargement  . History of tobacco use quit 1990s  . 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 Personal history of colonic adenomas 07/06/2013  . Hypertensive retinopathy of both eyes 2015    mild  . Dysrhythmia     Atrial fib (found 07/2014)  . Shortness of breath   . Pneumonia   . GERD (gastroesophageal reflux disease)     occasional  . Arthritis   . Neuropathy ([facility])   . Constipation   . 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 Depression   . Non-small cell carcinoma of left lung ([facility]) 02/2014    stage IIIb/IV on chemo  . Full code status 09/26/2015  . Atrial fibrillation ([facility])   . Lobar pneumonia ([facility]) 10/18/2015  . HCAP (healthcare-associated pneumonia) 11/27/2015    IV vanc/zosyn then levaquin courses x2   . Diabetes mellitus type 2 with retinopathy ([facility])   . Acute infarction of intestine, part and extent unspecified ([facility]) 04/25/2016  . 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  MEDICATIONS:  Current Outpatient Prescriptions  Medication Sig Dispense Refill  . acetaminophen (TYLENOL) 500 MG tablet Take 500 mg by mouth 2 (two) times daily. Reported on 05/06/2016    . albuterol (PROVENTIL HFA;VENTOLIN HFA) 108 (90 BASE) MCG/ACT inhaler Inhale 2 puffs into the lungs every 6 (six) hours as needed for wheezing or shortness of breath. 1 Inhaler 3  . Alum & Mag Hydroxide-Simeth (MAGIC MOUTHWASH  W/LIDOCAINE) SOLN Take 5 mLs by mouth 3 (three) times daily as needed for mouth pain. 5 mL 0  . apixaban (ELIQUIS) 5 MG TABS tablet Take 1 tablet (5 mg total) by mouth 2 (two) times daily. Restart when platelets >50.    . benzonatate (TESSALON) 100 MG capsule Take 1 capsule (100 mg total) by mouth 2 (two) times daily as needed for cough. 30 capsule 0  . dexamethasone (DECADRON) 4 MG tablet 2 tab po bid, the day before, day of and day after chemotherapy every 3 weeks (Patient taking differently: Take 4 mg by mouth 2 (two) times daily. 2 tab twice daily, the day before, day of and day after chemotherapy every 3 weeks) 40 tablet 1  . diphenoxylate-atropine (LOMOTIL) 2.5-0.025 MG per tablet Take 2 tablets by mouth 4 (four) times daily as needed for diarrhea or loose stools. 30 tablet 0  . glipiZIDE (GLUCOTROL) 5 MG tablet Take 1 tablet (5 mg total) by mouth as directed. Take one daily when taking steroid (dexamethasone).    Marland Kitchen glucose blood (ONE TOUCH ULTRA TEST) test strip Use to check sugar once daily and as needed. Dx: E09.9 100 each 3  . guaiFENesin (MUCINEX) 600 MG 12 hr tablet Take 600 mg by mouth 2 (two) times daily as needed for cough or to loosen phlegm.     . hydrocortisone (ANUSOL-HC) 25 MG suppository Place 1 suppository (25 mg total) rectally 2 (two) times daily. 12 suppository 0  . KLOR-CON M10 10 MEQ tablet Take 20 mEq by mouth daily as needed (with torsemide).   10  . lidocaine-prilocaine (EMLA) cream Apply 1 application topically as needed. 30 g 1  . loratadine (CLARITIN) 10 MG tablet Take 10 mg by mouth as directed. Reported on 01/05/2016    . methocarbamol (ROBAXIN) 500 MG tablet Take 500 mg by mouth 3 (three) times daily as needed. Muscle pain    . metoprolol tartrate (LOPRESSOR) 50 MG tablet Take 0.5 tablets (25 mg total) by mouth 2 (two) times daily. 30 tablet 6  . Multiple Vitamins-Minerals (ICAPS) CAPS Take 1 capsule by mouth 2 (two) times daily.     [name] DELICA LANCETS 10U  MISC Use to check sugar once daily and as needed. Dx:E09.9 100 each 3  . polyethylene glycol (MIRALAX / GLYCOLAX) packet Take 8.5 g by mouth daily as needed for moderate constipation.     . potassium chloride (K-DUR) 10 MEQ tablet Take two tablets daily as needed with Torsemide. 180 tablet 3  . spironolactone (ALDACTONE) 50 MG tablet TAKE ONE TABLET BY MOUTH ONCE DAILY 30 tablet 6  . sucralfate (CARAFATE) 1 g tablet Take 1 tablet (1 g total) by mouth 3 (three) times daily before meals. 90 tablet 1  . torsemide (DEMADEX) 20 MG tablet Take 20 mg by mouth 2 (two) times daily. Takes one tablet daily and if swelling continues he takes a second pill    . traMADol (ULTRAM) 50 MG tablet Take 1 tablet (50 mg total) by mouth every 6 (  six) hours as needed. 30 tablet 3  . triamcinolone cream (KENALOG) 0.1 % Apply 1 application topically 2 (two) times daily. Apply to AA. 60 g 0   No current facility-administered medications for this visit.    SURGICAL HISTORY:  Past Surgical History  Procedure Laterality Date  . Colonoscopy  2004  . Colonoscopy  2014    tubular adenoma x1, mod diverticulosis ([facility])  . Flexible bronchoscopy  02/2014    WNL  . Video bronchoscopy Bilateral 03/17/2014    Procedure: VIDEO BRONCHOSCOPY WITH FLUORO;  Surgeon: Tanda Rockers, MD;  Location: [facility];  Service: Cardiopulmonary;  Laterality: Bilateral;  . Chest tube insertion Left 08/26/2014    Procedure: INSERTION OF LEFT  PLEURAL DRAINAGE CATHETER;  Surgeon: Ivin Poot, MD;  Location: [facility];  Service: Thoracic;  Laterality: Left;  . Cataract extraction Right 03/2015    Dr [name]  . Portacath placement Right 03/31/2015    Procedure: INSERTION PORT-A-CATH;  Surgeon: Ivin Poot, MD;  Location: [facility];  Service: Thoracic;  Laterality: Right;  . Removal of pleural drainage catheter Left 03/31/2015    Procedure: REMOVAL OF PLEURAL DRAINAGE CATHETER;  Surgeon: Ivin Poot, MD;  Location: [facility];  Service: Thoracic;   Laterality: Left;    REVIEW OF SYSTEMS:  A comprehensive review of systems was negative except for: Constitutional: positive for fatigue Musculoskeletal: positive for arthralgias   PHYSICAL EXAMINATION: General appearance: alert, cooperative, fatigued and no distress Head: Normocephalic, without obvious abnormality, atraumatic Neck: no adenopathy, no JVD, supple, symmetrical, trachea midline and thyroid not enlarged, symmetric, no tenderness/mass/nodules Lymph nodes: Cervical, supraclavicular, and axillary nodes normal. Resp: diminished breath sounds LLL and dullness to percussion LLL Back: symmetric, no curvature. ROM normal. No CVA tenderness. Cardio: regular rate and rhythm, S1, S2 normal, no murmur, click, rub or gallop GI: soft, non-tender; bowel sounds normal; no masses,  no organomegaly Extremities: edema 2+ Neurologic: Alert and oriented X 3, normal strength and tone. Normal symmetric reflexes. Normal coordination and gait  ECOG PERFORMANCE STATUS: 1 - Symptomatic but completely ambulatory  Blood pressure 125/73, pulse 73, temperature 98.3 F (36.8 C), temperature source Oral, resp. rate 18, height '5\' 9"'$  (1.753 m), weight 166 lb (75.297 kg), SpO2 100 %.  LABORATORY DATA: Lab Results  Component Value Date   WBC 4.5 06/19/2016   HGB 11.8* 06/19/2016   HCT 35.7* 06/19/2016   MCV 93.9 06/19/2016   PLT 139* 06/19/2016      Chemistry      Component Value Date/Time   NA 139 06/12/2016 1121   NA 139 02/13/2016 1405   K 5.0 06/12/2016 1121   K 5.1 02/13/2016 1405   CL 103 02/13/2016 1405   CO2 24 06/12/2016 1121   CO2 29 02/13/2016 1405   BUN 30.9* 06/12/2016 1121   BUN 27* 02/13/2016 1405   CREATININE 1.2 06/12/2016 1121   CREATININE 1.11 02/13/2016 1405   CREATININE 0.83 02/22/2014 1314      Component Value Date/Time   CALCIUM 9.0 06/12/2016 1121   CALCIUM 9.2 02/13/2016 1405   ALKPHOS 77 06/12/2016 1121   ALKPHOS 57 02/13/2016 1405   AST 20 06/12/2016 1121    AST 16 02/13/2016 1405   ALT 25 06/12/2016 1121   ALT 12 02/13/2016 1405   BILITOT 0.77 06/12/2016 1121   BILITOT 0.5 02/13/2016 1405       RADIOGRAPHIC STUDIES: No results found. ASSESSMENT AND PLAN: this is a very pleasant 75 years old white male  with:  1) Stage IV non-small cell lung cancer, adenocarcinoma presenting with large right upper lobe lung mass in addition to mediastinal and bilateral hilar lymphadenopathy as well as cervical lymphadenopathy and left pleural effusion. The patient completed a course systemic chemotherapy with carboplatin and Alimta status post 6 cycles. This was followed by 3 cycles of maintenance chemotherapy with single agent Alimta. The patient tolerated his treatment well. He had evidence for disease progression and the patient was started on treatment again with carboplatin and Alimta status post 6 cycles. He was treated with 4 cycles of immunotherapy with Nivolumab discontinued secondary to disease progression. The patient is currently on systemic chemotherapy with docetaxel and Cyramza status post 6 cycles and tolerated the treatment well except for fatigue and feels admission with recurrent pneumonia.  He has been on observation for the last 6 months. Unfortunately the recent CT scan of the chest, abdomen and pelvis showed evidence for disease progression with increase in the mediastinal lymphadenopathy as well as increase in size of retrocrural lymph node and gastrohepatic lymphadenopathy. He was started on systemic chemotherapy with docetaxel 75 MG/M2 every 3 weeks with Neulasta support. The patient tolerated the first cycle well except for the significant pain and arthralgias with the Neulasta injection and he doesn't think that he can continue with the same regimen including the Neulasta injection. I switched his treatment to docetaxel 25 MG/M2 on weekly basis and he is tolerating it much better. He is status post 3 weekly doses. I recommended for the  patient to proceed with cycle #4 today as a scheduled.  2) Atrial fibrillation: continue Eliquis.  3) lower extremity edema: Improved. Continue Demadex.  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 I contacted his primary care physician office for management of this condition since they ordered the MRI of the brain. I recommended for the patient to continue his current treatment to his Eliquis and advise him to start taking aspirin daily until he sees a neurologist.  5) CODE STATUS: Previously discussed with the patient and his wife. He would like initial resuscitation but no prolonged support if in vegetative state.  He would come back for follow-up visit in 3 weeks.  He was advised to call immediately if he has any concerning symptoms in the interval.  The patient voices understanding of current disease status and treatment options and is in agreement with the current care plan.  All questions were answered. The patient knows to call the clinic with any problems, questions or concerns. We can certainly see the patient much sooner if necessary.  Disclaimer: This note was dictated with voice recognition software. Similar sounding words can inadvertently be transcribed and may not be corrected upon review.

## 2016-06-19 NOTE — Patient Instructions (Signed)
Docetaxel injection  What is this medicine?  DOCETAXEL (doe se TAX el) is a chemotherapy drug. It targets fast dividing cells, like cancer cells, and causes these cells to die. This medicine is used to treat many types of cancers like breast cancer, certain stomach cancers, head and neck cancer, lung cancer, and prostate cancer.  This medicine may be used for other purposes; ask your health care provider or pharmacist if you have questions.  What should I tell my health care provider before I take this medicine?  They need to know if you have any of these conditions:  -infection (especially a virus infection such as chickenpox, cold sores, or herpes)  -liver disease  -low blood counts, like low white cell, platelet, or red cell counts  -an unusual or allergic reaction to docetaxel, polysorbate 80, other chemotherapy agents, other medicines, foods, dyes, or preservatives  -pregnant or trying to get pregnant  -breast-feeding  How should I use this medicine?  This drug is given as an infusion into a vein. It is administered in a hospital or clinic by a specially trained health care professional.  Talk to your pediatrician regarding the use of this medicine in children. Special care may be needed.  Overdosage: If you think you have taken too much of this medicine contact a poison control center or emergency room at once.  NOTE: This medicine is only for you. Do not share this medicine with others.  What if I miss a dose?  It is important not to miss your dose. Call your doctor or health care professional if you are unable to keep an appointment.  What may interact with this medicine?  -cyclosporine  -erythromycin  -ketoconazole  -medicines to increase blood counts like filgrastim, pegfilgrastim, sargramostim  -vaccines  Talk to your doctor or health care professional before taking any of these medicines:  -acetaminophen  -aspirin  -ibuprofen  -ketoprofen  -naproxen  This list may not describe all possible interactions.  Give your health care provider a list of all the medicines, herbs, non-prescription drugs, or dietary supplements you use. Also tell them if you smoke, drink alcohol, or use illegal drugs. Some items may interact with your medicine.  What should I watch for while using this medicine?  Your condition will be monitored carefully while you are receiving this medicine. You will need important blood work done while you are taking this medicine.  This drug may make you feel generally unwell. This is not uncommon, as chemotherapy can affect healthy cells as well as cancer cells. Report any side effects. Continue your course of treatment even though you feel ill unless your doctor tells you to stop.  In some cases, you may be given additional medicines to help with side effects. Follow all directions for their use.  Call your doctor or health care professional for advice if you get a fever, chills or sore throat, or other symptoms of a cold or flu. Do not treat yourself. This drug decreases your body's ability to fight infections. Try to avoid being around people who are sick.  This medicine may increase your risk to bruise or bleed. Call your doctor or health care professional if you notice any unusual bleeding.  This medicine may contain alcohol in the product. You may get drowsy or dizzy. Do not drive, use machinery, or do anything that needs mental alertness until you know how this medicine affects you. Do not stand or sit up quickly, especially if you are an older   patient. This reduces the risk of dizzy or fainting spells. Avoid alcoholic drinks.  Do not become pregnant while taking this medicine. Women should inform their doctor if they wish to become pregnant or think they might be pregnant. There is a potential for serious side effects to an unborn child. Talk to your health care professional or pharmacist for more information. Do not breast-feed an infant while taking this medicine.  What side effects may I notice  from receiving this medicine?  Side effects that you should report to your doctor or health care professional as soon as possible:  -allergic reactions like skin rash, itching or hives, swelling of the face, lips, or tongue  -low blood counts - This drug may decrease the number of white blood cells, red blood cells and platelets. You may be at increased risk for infections and bleeding.  -signs of infection - fever or chills, cough, sore throat, pain or difficulty passing urine  -signs of decreased platelets or bleeding - bruising, pinpoint red spots on the skin, black, tarry stools, nosebleeds  -signs of decreased red blood cells - unusually weak or tired, fainting spells, lightheadedness  -breathing problems  -fast or irregular heartbeat  -low blood pressure  -mouth sores  -nausea and vomiting  -pain, swelling, redness or irritation at the injection site  -pain, tingling, numbness in the hands or feet  -swelling of the ankle, feet, hands  -weight gain  Side effects that usually do not require medical attention (report to your prescriber or health care professional if they continue or are bothersome):  -bone pain  -complete hair loss including hair on your head, underarms, pubic hair, eyebrows, and eyelashes  -diarrhea  -excessive tearing  -changes in the color of fingernails  -loosening of the fingernails  -nausea  -muscle pain  -red flush to skin  -sweating  -weak or tired  This list may not describe all possible side effects. Call your doctor for medical advice about side effects. You may report side effects to FDA at 1-800-FDA-1088.  Where should I keep my medicine?  This drug is given in a hospital or clinic and will not be stored at home.  NOTE: This sheet is a summary. It may not cover all possible information. If you have questions about this medicine, talk to your doctor, pharmacist, or health care provider.      2016, Elsevier/Gold Standard. (2014-12-26 16:04:57)

## 2016-06-19 NOTE — Telephone Encounter (Signed)
Gave and printed appt sched and avs for pt for July and Aug °

## 2016-06-19 NOTE — Patient Instructions (Signed)

## 2016-06-21 ENCOUNTER — Ambulatory Visit: Payer: Medicare Other

## 2016-06-26 ENCOUNTER — Ambulatory Visit (INDEPENDENT_AMBULATORY_CARE_PROVIDER_SITE_OTHER): Payer: Medicare Other | Admitting: Cardiothoracic Surgery

## 2016-06-26 ENCOUNTER — Ambulatory Visit: Payer: Medicare Other

## 2016-06-26 ENCOUNTER — Ambulatory Visit: Payer: Medicare Other | Admitting: Cardiothoracic Surgery

## 2016-06-26 ENCOUNTER — Ambulatory Visit (HOSPITAL_BASED_OUTPATIENT_CLINIC_OR_DEPARTMENT_OTHER): Payer: Medicare Other

## 2016-06-26 ENCOUNTER — Encounter: Payer: Self-pay | Admitting: Cardiothoracic Surgery

## 2016-06-26 ENCOUNTER — Other Ambulatory Visit (HOSPITAL_BASED_OUTPATIENT_CLINIC_OR_DEPARTMENT_OTHER): Payer: Medicare Other

## 2016-06-26 VITALS — BP 113/67 | HR 72 | Temp 98.4°F | Resp 16

## 2016-06-26 VITALS — BP 141/82 | HR 80 | Resp 16 | Ht 69.0 in | Wt 166.0 lb

## 2016-06-26 DIAGNOSIS — C3411 Malignant neoplasm of upper lobe, right bronchus or lung: Secondary | ICD-10-CM | POA: Diagnosis present

## 2016-06-26 DIAGNOSIS — I63432 Cerebral infarction due to embolism of left posterior cerebral artery: Secondary | ICD-10-CM | POA: Diagnosis not present

## 2016-06-26 DIAGNOSIS — C3492 Malignant neoplasm of unspecified part of left bronchus or lung: Secondary | ICD-10-CM

## 2016-06-26 DIAGNOSIS — I63422 Cerebral infarction due to embolism of left anterior cerebral artery: Secondary | ICD-10-CM

## 2016-06-26 DIAGNOSIS — J910 Malignant pleural effusion: Secondary | ICD-10-CM

## 2016-06-26 DIAGNOSIS — K769 Liver disease, unspecified: Secondary | ICD-10-CM

## 2016-06-26 DIAGNOSIS — Z5111 Encounter for antineoplastic chemotherapy: Secondary | ICD-10-CM

## 2016-06-26 DIAGNOSIS — Z95828 Presence of other vascular implants and grafts: Secondary | ICD-10-CM

## 2016-06-26 LAB — CBC WITH DIFFERENTIAL/PLATELET
BASO%: 0.4 % (ref 0.0–2.0)
BASOS ABS: 0 10*3/uL (ref 0.0–0.1)
EOS%: 0.2 % (ref 0.0–7.0)
Eosinophils Absolute: 0 10*3/uL (ref 0.0–0.5)
HCT: 34.3 % — ABNORMAL LOW (ref 38.4–49.9)
HGB: 11.6 g/dL — ABNORMAL LOW (ref 13.0–17.1)
LYMPH%: 14.9 % (ref 14.0–49.0)
MCH: 31.5 pg (ref 27.2–33.4)
MCHC: 33.8 g/dL (ref 32.0–36.0)
MCV: 93.2 fL (ref 79.3–98.0)
MONO#: 0.6 10*3/uL (ref 0.1–0.9)
MONO%: 11.4 % (ref 0.0–14.0)
NEUT#: 3.7 10*3/uL (ref 1.5–6.5)
NEUT%: 73.1 % (ref 39.0–75.0)
PLATELETS: 132 10*3/uL — AB (ref 140–400)
RBC: 3.68 10*6/uL — AB (ref 4.20–5.82)
RDW: 17.8 % — ABNORMAL HIGH (ref 11.0–14.6)
WBC: 5.1 10*3/uL (ref 4.0–10.3)
lymph#: 0.8 10*3/uL — ABNORMAL LOW (ref 0.9–3.3)

## 2016-06-26 LAB — COMPREHENSIVE METABOLIC PANEL
ALT: 27 U/L (ref 0–55)
ANION GAP: 10 meq/L (ref 3–11)
AST: 24 U/L (ref 5–34)
Albumin: 3.4 g/dL — ABNORMAL LOW (ref 3.5–5.0)
Alkaline Phosphatase: 86 U/L (ref 40–150)
BUN: 31.8 mg/dL — ABNORMAL HIGH (ref 7.0–26.0)
CHLORIDE: 105 meq/L (ref 98–109)
CO2: 24 meq/L (ref 22–29)
CREATININE: 1.3 mg/dL (ref 0.7–1.3)
Calcium: 9.2 mg/dL (ref 8.4–10.4)
EGFR: 55 mL/min/{1.73_m2} — ABNORMAL LOW (ref >90)
Glucose: 160 mg/dl — ABNORMAL HIGH (ref 70–140)
POTASSIUM: 5.1 meq/L (ref 3.5–5.1)
Sodium: 139 mEq/L (ref 136–145)
Total Bilirubin: 0.5 mg/dL (ref 0.20–1.20)
Total Protein: 6.3 g/dL — ABNORMAL LOW (ref 6.4–8.3)

## 2016-06-26 MED ORDER — SODIUM CHLORIDE 0.9 % IJ SOLN
10.0000 mL | INTRAMUSCULAR | Status: AC | PRN
  Administered 2016-06-26: 10 mL
  Filled 2016-06-26: qty 10

## 2016-06-26 MED ORDER — SODIUM CHLORIDE 0.9 % IV SOLN
Freq: Once | INTRAVENOUS | Status: AC
  Administered 2016-06-26: 13:00:00 via INTRAVENOUS

## 2016-06-26 MED ORDER — HEPARIN SOD (PORK) LOCK FLUSH 100 UNIT/ML IV SOLN
500.0000 [IU] | Freq: Once | INTRAVENOUS | Status: AC | PRN
  Administered 2016-06-26: 500 [IU]
  Filled 2016-06-26: qty 5

## 2016-06-26 MED ORDER — SODIUM CHLORIDE 0.9 % IV SOLN
10.0000 mg | Freq: Once | INTRAVENOUS | Status: AC
  Administered 2016-06-26: 10 mg via INTRAVENOUS
  Filled 2016-06-26: qty 1

## 2016-06-26 MED ORDER — SODIUM CHLORIDE 0.9 % IV SOLN
25.0000 mg/m2 | Freq: Once | INTRAVENOUS | Status: AC
  Administered 2016-06-26: 50 mg via INTRAVENOUS
  Filled 2016-06-26: qty 5

## 2016-06-26 MED ORDER — SODIUM CHLORIDE 0.9% FLUSH
10.0000 mL | INTRAVENOUS | Status: DC | PRN
  Administered 2016-06-26: 10 mL
  Filled 2016-06-26: qty 10

## 2016-06-26 NOTE — Progress Notes (Signed)
PCP is [name], MD Referring Provider is Curt Bears, MD  Chief Complaint  Patient presents with  . Routine Post Op    3 month f/u    [id number] follow-up of right Port-A-Cath Patient is receiving weekly IV chemotherapy directed by his oncologist through the Port-A-Cath. There has been no skin changes or tenderness at the site. The patient is feeling well, gaining weight, and appears to be responding to his chemotherapy regimen. Last month the patient had an transient change and right-sided visual field deficit was found to have a small embolic stroke. He was on Eliquis and aspirin. He had full resolution of his visual field cut. His aspirin has been stopped and he continues the Eliquis  Past Medical History  Diagnosis Date  . History of diabetes mellitus, type II     resolved with diet, h/o neuropathy  . Diverticulosis of colon   . Hyperlipidemia   . Hypertension   . Fatty liver 02/29/00    abd ultrasound  . Lower back pain   . Systolic murmur 8416    2Decho - normal LV fxn, EF 55%, mild AS, biatrial enlargement  . History of tobacco use quit 1990s  . 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 Personal history of colonic adenomas 07/06/2013  . Hypertensive retinopathy of both eyes 2015    mild  . Dysrhythmia     Atrial fib (found 07/2014)  . Shortness of breath   . Pneumonia   . GERD (gastroesophageal reflux disease)     occasional  . Arthritis   . Neuropathy ([facility])   . Constipation   . 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 Depression   . Non-small cell carcinoma of left lung ([facility]) 02/2014    stage IIIb/IV on chemo  . Full code status 09/26/2015  . Atrial fibrillation ([facility])   . Lobar pneumonia ([facility]) 10/18/2015  . HCAP (healthcare-associated pneumonia) 11/27/2015    IV vanc/zosyn then levaquin courses x2    . Diabetes mellitus type 2 with retinopathy ([facility])   . Acute infarction of intestine, part and extent unspecified ([facility]) 04/25/2016  . 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 Colonoscopy  2004  . Colonoscopy  2014    tubular adenoma x1, mod diverticulosis ([facility])  . Flexible bronchoscopy  02/2014    WNL  . Video bronchoscopy Bilateral 03/17/2014    Procedure: VIDEO BRONCHOSCOPY WITH FLUORO;  Surgeon: Tanda Rockers, MD;  [facility]: [facility];  Service: Cardiopulmonary;  Laterality: Bilateral;  . Chest tube insertion Left 08/26/2014    Procedure: INSERTION OF LEFT  PLEURAL DRAINAGE CATHETER;  Surgeon: Ivin Poot, MD;  Location: [facility];  Service: Thoracic;  Laterality: Left;  . Cataract extraction Right 03/2015    Dr [name]  . Portacath placement Right 03/31/2015    Procedure: INSERTION PORT-A-CATH;  Surgeon: Ivin Poot, MD;  Location: [facility];  Service: Thoracic;  Laterality: Right;  . Removal of pleural drainage catheter Left 03/31/2015    Procedure: REMOVAL OF PLEURAL DRAINAGE CATHETER;  Surgeon: Ivin Poot, MD;  Location: [facility] OR;  Service: Thoracic;  Laterality: Left;    Family History  Problem Relation Age of Onset  . Stroke Mother     multiple mini strokes  . Hypertension Mother   .  Heart disease Maternal Grandfather     MI  . Stroke Paternal Grandmother   . Prostate cancer Father     Social History Social History  Substance Use Topics  . Smoking status: Former Smoker -- 3.00 packs/day for 30 years    Types: Cigarettes    Quit date: 12/23/1988  . Smokeless tobacco: Never Used  . Alcohol Use: 12.6 oz/week    21 Glasses of wine per week     Comment: 3-4 wine a day ( 8/03- 2 beers, 2 wines, 2 brandies daily)    Current Outpatient Prescriptions  Medication Sig Dispense Refill  . acetaminophen (TYLENOL) 500 MG tablet Take 500 mg by mouth 2 (two) times daily. Reported on 05/06/2016    . albuterol (PROVENTIL  HFA;VENTOLIN HFA) 108 (90 BASE) MCG/ACT inhaler Inhale 2 puffs into the lungs every 6 (six) hours as needed for wheezing or shortness of breath. 1 Inhaler 3  . Alum & Mag Hydroxide-Simeth (MAGIC MOUTHWASH W/LIDOCAINE) SOLN Take 5 mLs by mouth 3 (three) times daily as needed for mouth pain. 5 mL 0  . apixaban (ELIQUIS) 5 MG TABS tablet Take 1 tablet (5 mg total) by mouth 2 (two) times daily. Restart when platelets >50.    . benzonatate (TESSALON) 100 MG capsule Take 1 capsule (100 mg total) by mouth 2 (two) times daily as needed for cough. 30 capsule 0  . dexamethasone (DECADRON) 4 MG tablet 2 tab po bid, the day before, day of and day after chemotherapy every 3 weeks (Patient taking differently: Take 4 mg by mouth 2 (two) times daily. 2 tab twice daily, the day before, day of and day after chemotherapy every 3 weeks) 40 tablet 1  . diphenoxylate-atropine (LOMOTIL) 2.5-0.025 MG per tablet Take 2 tablets by mouth 4 (four) times daily as needed for diarrhea or loose stools. 30 tablet 0  . glipiZIDE (GLUCOTROL) 5 MG tablet Take 1 tablet (5 mg total) by mouth as directed. Take one daily when taking steroid (dexamethasone).    Marland Kitchen glucose blood (ONE TOUCH ULTRA TEST) test strip Use to check sugar once daily and as needed. Dx: E09.9 100 each 3  . guaiFENesin (MUCINEX) 600 MG 12 hr tablet Take 600 mg by mouth 2 (two) times daily as needed for cough or to loosen phlegm.     . hydrocortisone (ANUSOL-HC) 25 MG suppository Place 1 suppository (25 mg total) rectally 2 (two) times daily. 12 suppository 0  . KLOR-CON M10 10 MEQ tablet Take 20 mEq by mouth daily as needed (with torsemide).   10  . lidocaine-prilocaine (EMLA) cream Apply 1 application topically as needed. 30 g 1  . loratadine (CLARITIN) 10 MG tablet Take 10 mg by mouth as directed. Reported on 01/05/2016    . methocarbamol (ROBAXIN) 500 MG tablet Take 500 mg by mouth 3 (three) times daily as needed. Muscle pain    . metoprolol tartrate (LOPRESSOR) 50 MG  tablet Take 0.5 tablets (25 mg total) by mouth 2 (two) times daily. 30 tablet 6  . Multiple Vitamins-Minerals (ICAPS) CAPS Take 1 capsule by mouth 2 (two) times daily.     [name] DELICA LANCETS 95A MISC Use to check sugar once daily and as needed. Dx:E09.9 100 each 3  . polyethylene glycol (MIRALAX / GLYCOLAX) packet Take 8.5 g by mouth daily as needed for moderate constipation.     . potassium chloride (K-DUR) 10 MEQ tablet Take two tablets daily as needed with Torsemide. 180 tablet 3  . spironolactone (  ALDACTONE) 50 MG tablet TAKE ONE TABLET BY MOUTH ONCE DAILY 30 tablet 6  . sucralfate (CARAFATE) 1 g tablet Take 1 tablet (1 g total) by mouth 3 (three) times daily before meals. 90 tablet 1  . torsemide (DEMADEX) 20 MG tablet Take 20 mg by mouth 2 (two) times daily. Takes one tablet daily and if swelling continues he takes a second pill    . traMADol (ULTRAM) 50 MG tablet Take 1 tablet (50 mg total) by mouth every 6 (six) hours as needed. 30 tablet 3  . triamcinolone cream (KENALOG) 0.1 % Apply 1 application topically 2 (two) times daily. Apply to AA. 60 g 0   No current facility-administered medications for this visit.    Allergies  Allergen Reactions  . Candesartan Cilexetil     REACTION: Muscle spasms  . Diltiazem Hcl     REACTION: Dizziness  . Doxazosin Mesylate Other (See Comments)    REACTION: H/A's  . Nifedipine     REACTION: Leg swelling  . Pravastatin Other (See Comments)    Muscle aches  . Red Yeast Rice     Muscle aches  . Rosuvastatin Other (See Comments)    REACTION: questionable: severe constipation  . Sertraline Other (See Comments)    oversedation  . Simvastatin Other (See Comments)    REACTION: Muscle aches  . Hydrocodone Other (See Comments)    constipation    Review of Systems  The patient has gained 3-5 pounds. He is working in his yard He feels stronger overall  BP 141/82 mmHg  Pulse 80  Resp 16  Ht '5\' 9"'$  (1.753 m)  Wt 166 lb (75.297 kg)  BMI  24.50 kg/m2  SpO2 99% Physical Exam Alert and comfortable Breath sounds equal but slightly coarse on the left Right chest Port-A-Cath reservoir intact, normal No peripheral edema  Diagnostic Tests: No chest x-ray today  Impression: Right subclavian Port-A-Cath catheter functioning well No skin problems No evidence of recurrent malignant left pleural effusion on exam  Plan: Return in 2 months to monitor progress and the function of the [place] III, MD [facility] [phone]

## 2016-06-26 NOTE — Patient Instructions (Signed)
[facility] Discharge Instructions for Patients Receiving Chemotherapy  Today you received the following chemotherapy agents:  Taxotere.  To help prevent nausea and vomiting after your treatment, we encourage you to take your nausea medication as directed.   If you develop nausea and vomiting that is not controlled by your nausea medication, call the clinic.   BELOW ARE SYMPTOMS THAT SHOULD BE REPORTED IMMEDIATELY:  *FEVER GREATER THAN 100.5 F  *CHILLS WITH OR WITHOUT FEVER  NAUSEA AND VOMITING THAT IS NOT CONTROLLED WITH YOUR NAUSEA MEDICATION  *UNUSUAL SHORTNESS OF BREATH  *UNUSUAL BRUISING OR BLEEDING  TENDERNESS IN MOUTH AND THROAT WITH OR WITHOUT PRESENCE OF ULCERS  *URINARY PROBLEMS  *BOWEL PROBLEMS  UNUSUAL RASH Items with * indicate a potential emergency and should be followed up as soon as possible.  Feel free to call the clinic you have any questions or concerns. The clinic phone number is [phone].  Please show the CHEMO ALERT CARD at check-in to the Emergency Department and triage nurse.  

## 2016-06-26 NOTE — Patient Instructions (Signed)

## 2016-07-03 ENCOUNTER — Ambulatory Visit: Payer: Medicare Other | Admitting: Neurology

## 2016-07-03 ENCOUNTER — Ambulatory Visit: Payer: Medicare Other

## 2016-07-03 ENCOUNTER — Ambulatory Visit (HOSPITAL_BASED_OUTPATIENT_CLINIC_OR_DEPARTMENT_OTHER): Payer: Medicare Other

## 2016-07-03 ENCOUNTER — Telehealth: Payer: Self-pay | Admitting: *Deleted

## 2016-07-03 ENCOUNTER — Other Ambulatory Visit (HOSPITAL_BASED_OUTPATIENT_CLINIC_OR_DEPARTMENT_OTHER): Payer: Medicare Other

## 2016-07-03 VITALS — BP 150/77 | HR 84 | Temp 97.7°F | Resp 16

## 2016-07-03 DIAGNOSIS — C3492 Malignant neoplasm of unspecified part of left bronchus or lung: Secondary | ICD-10-CM

## 2016-07-03 DIAGNOSIS — Z5111 Encounter for antineoplastic chemotherapy: Secondary | ICD-10-CM

## 2016-07-03 DIAGNOSIS — C3411 Malignant neoplasm of upper lobe, right bronchus or lung: Secondary | ICD-10-CM

## 2016-07-03 DIAGNOSIS — C3491 Malignant neoplasm of unspecified part of right bronchus or lung: Secondary | ICD-10-CM

## 2016-07-03 DIAGNOSIS — Z95828 Presence of other vascular implants and grafts: Secondary | ICD-10-CM

## 2016-07-03 DIAGNOSIS — K769 Liver disease, unspecified: Secondary | ICD-10-CM

## 2016-07-03 LAB — CBC WITH DIFFERENTIAL/PLATELET
BASO%: 0 % (ref 0.0–2.0)
BASOS ABS: 0 10*3/uL (ref 0.0–0.1)
EOS%: 0.2 % (ref 0.0–7.0)
Eosinophils Absolute: 0 10*3/uL (ref 0.0–0.5)
HEMATOCRIT: 32.7 % — AB (ref 38.4–49.9)
HEMOGLOBIN: 11 g/dL — AB (ref 13.0–17.1)
LYMPH#: 0.6 10*3/uL — AB (ref 0.9–3.3)
LYMPH%: 14.4 % (ref 14.0–49.0)
MCH: 31.3 pg (ref 27.2–33.4)
MCHC: 33.6 g/dL (ref 32.0–36.0)
MCV: 93.2 fL (ref 79.3–98.0)
MONO#: 0.5 10*3/uL (ref 0.1–0.9)
MONO%: 12.4 % (ref 0.0–14.0)
NEUT#: 3.2 10*3/uL (ref 1.5–6.5)
NEUT%: 73 % (ref 39.0–75.0)
Platelets: 144 10*3/uL (ref 140–400)
RBC: 3.51 10*6/uL — ABNORMAL LOW (ref 4.20–5.82)
RDW: 17.6 % — ABNORMAL HIGH (ref 11.0–14.6)
WBC: 4.4 10*3/uL (ref 4.0–10.3)
nRBC: 0 % (ref 0–0)

## 2016-07-03 LAB — COMPREHENSIVE METABOLIC PANEL
ALBUMIN: 3.2 g/dL — AB (ref 3.5–5.0)
ALK PHOS: 90 U/L (ref 40–150)
ALT: 22 U/L (ref 0–55)
AST: 22 U/L (ref 5–34)
Anion Gap: 6 mEq/L (ref 3–11)
BUN: 26.4 mg/dL — AB (ref 7.0–26.0)
CALCIUM: 9.1 mg/dL (ref 8.4–10.4)
CO2: 25 mEq/L (ref 22–29)
Chloride: 107 mEq/L (ref 98–109)
Creatinine: 1.2 mg/dL (ref 0.7–1.3)
EGFR: 57 mL/min/{1.73_m2} — AB (ref >90)
Glucose: 159 mg/dl — ABNORMAL HIGH (ref 70–140)
POTASSIUM: 5 meq/L (ref 3.5–5.1)
Sodium: 139 mEq/L (ref 136–145)
Total Bilirubin: 0.61 mg/dL (ref 0.20–1.20)
Total Protein: 6.1 g/dL — ABNORMAL LOW (ref 6.4–8.3)

## 2016-07-03 MED ORDER — DOCETAXEL CHEMO INJECTION 160 MG/16ML
25.0000 mg/m2 | Freq: Once | INTRAVENOUS | Status: AC
Start: 2016-07-03 — End: 2016-07-03
  Administered 2016-07-03: 50 mg via INTRAVENOUS
  Filled 2016-07-03: qty 5

## 2016-07-03 MED ORDER — HEPARIN SOD (PORK) LOCK FLUSH 100 UNIT/ML IV SOLN
500.0000 [IU] | Freq: Once | INTRAVENOUS | Status: AC | PRN
  Administered 2016-07-03: 500 [IU]
  Filled 2016-07-03: qty 5

## 2016-07-03 MED ORDER — SODIUM CHLORIDE 0.9 % IJ SOLN
10.0000 mL | INTRAMUSCULAR | Status: AC | PRN
  Administered 2016-07-03: 10 mL
  Filled 2016-07-03: qty 10

## 2016-07-03 MED ORDER — DOCETAXEL CHEMO INJECTION 160 MG/16ML: 25.0000 mg/m2 | Freq: Once | INTRAVENOUS | Status: DC

## 2016-07-03 MED ORDER — SODIUM CHLORIDE 0.9% FLUSH
10.0000 mL | INTRAVENOUS | Status: DC | PRN
  Administered 2016-07-03: 10 mL
  Filled 2016-07-03: qty 10

## 2016-07-03 MED ORDER — SODIUM CHLORIDE 0.9 % IV SOLN
Freq: Once | INTRAVENOUS | Status: AC
  Administered 2016-07-03: 13:00:00 via INTRAVENOUS

## 2016-07-03 MED ORDER — SODIUM CHLORIDE 0.9 % IV SOLN
10.0000 mg | Freq: Once | INTRAVENOUS | Status: AC
  Administered 2016-07-03: 10 mg via INTRAVENOUS
  Filled 2016-07-03: qty 1

## 2016-07-03 NOTE — Patient Instructions (Signed)
[facility] Discharge Instructions for Patients Receiving Chemotherapy  Today you received the following chemotherapy agents:  Taxotere.  To help prevent nausea and vomiting after your treatment, we encourage you to take your nausea medication as directed.   If you develop nausea and vomiting that is not controlled by your nausea medication, call the clinic.   BELOW ARE SYMPTOMS THAT SHOULD BE REPORTED IMMEDIATELY:  *FEVER GREATER THAN 100.5 F  *CHILLS WITH OR WITHOUT FEVER  NAUSEA AND VOMITING THAT IS NOT CONTROLLED WITH YOUR NAUSEA MEDICATION  *UNUSUAL SHORTNESS OF BREATH  *UNUSUAL BRUISING OR BLEEDING  TENDERNESS IN MOUTH AND THROAT WITH OR WITHOUT PRESENCE OF ULCERS  *URINARY PROBLEMS  *BOWEL PROBLEMS  UNUSUAL RASH Items with * indicate a potential emergency and should be followed up as soon as possible.  Feel free to call the clinic you have any questions or concerns. The clinic phone number is [phone].  Please show the CHEMO ALERT CARD at check-in to the Emergency Department and triage nurse.  

## 2016-07-03 NOTE — Patient Instructions (Signed)

## 2016-07-03 NOTE — Telephone Encounter (Signed)
Spouse called asking for an "order for [facility] to discontinue oxygen.  He's been on a reprieve not having to use oxygen for a few weeks now and [facility] will not pick up the tanks without an order to d/c."   Advised they keep oxygen on hand stored correctly for future use of needed.

## 2016-07-04 ENCOUNTER — Ambulatory Visit (INDEPENDENT_AMBULATORY_CARE_PROVIDER_SITE_OTHER): Payer: Medicare Other | Admitting: Neurology

## 2016-07-04 ENCOUNTER — Encounter: Payer: Self-pay | Admitting: Neurology

## 2016-07-04 VITALS — BP 136/86 | HR 85 | Ht 69.0 in | Wt 170.5 lb

## 2016-07-04 DIAGNOSIS — I63422 Cerebral infarction due to embolism of left anterior cerebral artery: Secondary | ICD-10-CM | POA: Diagnosis not present

## 2016-07-04 NOTE — Patient Instructions (Signed)
melatonin ?

## 2016-07-04 NOTE — Progress Notes (Signed)
Chief Complaint  Patient presents with  . Cerebrovascular Accident    He is now getting smaller doses of chemotherapy weekly rather than larger doses every three weeks.  He is handling this treatment regimen better.  He has been having some problems with his blood sugar now that he is getting steroids weekly.  He has no new stroke related concerns.  He is still taking Eliquis '5mg'$ , BID.      PATIENT: Shawn Espinoza DOB: 04/20/1941  Chief Complaint  Patient presents with  . Cerebrovascular Accident    He is now getting smaller doses of chemotherapy weekly rather than larger doses every three weeks.  He is handling this treatment regimen better.  He has been having some problems with his blood sugar now that he is getting steroids weekly.  He has no new stroke related concerns.  He is still taking Eliquis '5mg'$ , BID.     HISTORICAL  Shawn Espinoza is a 75 years old right-handed male, seen in refer by his primary care physician Dr.  [name], for evaluation of abnormal MRI of the brain, stroke on May 10th 2017  He had a history of hypertension, hyperlipidemia, diabetes, diabetic peripheral neuropathy, stage IV non-small cell lung cancer, presented with right upper lobe lung mass, in addition to mediastinal and bilateral hilar lymphoadenopathy, pleural effusion. He has been treated with chemotherapy, most recent repeat CT chest and abdomen in May 2017 showed evidence of disease progression with increase in the mediastinal lymphadenopathy as well as increase in size of retrocrural lymph node and potential increased gastrohepatic lymph node, concern for expansion/infiltration of dominant hepatic mass into the more right inferior hepatic lobe  He was diagnosed with atrial fibrillation in 2016, has been taking Eliquis 5 mg twice a day, in early April 2017, while shopping with his wife, he noticed sudden onset of loss of the vision in his right upper visual field, since only involving his  right eye, few minutes later, he noticed blurry vision in his right visual field, lasting for few minutes, with this shows seated shakiness, then he was able to return back to normal, couple weeks later, he woke up in the morning, had a set onset double vision, lasting for a few minutes,  I personally reviewed MRI of the brain in Apr 25 2016: Multifocal areas of subcentimeter, nonhemorrhagic, acute infarction affecting the LEFT cerebral hemisphere and LEFT cerebellum. Shower of emboli is suspected. There was no evidence of intracranial metastatic disease.  He was evaluated by his oncologist Dr. Earlie Server, was given aspirin 81 mg on top of his Eliquis 5 mg twice a day, he noticed increased skin bruise,  We also reviewed echocardiogram in May 2017, normal ejection fraction 40-34 %, no embolic source identified, ultrasound of carotid arteries pending  UPDATE July 04 2016: Ultrasound of carotid artery on May 01 2016 showed heterogeneous plaque bilaterally internal carotid artery, less than 40% stenosis. Antegrade flow bilateral vertebral artery.  He is still receiving chemotherapy with some dose modification.     REVIEW OF SYSTEMS: Full 14 system review of systems performed and notable only for swelling in legs, hearing loss, blurred vision, double vision, shortness of breath, cough, wheezing, snoring, feeling cold, increased thirst, achy muscles, allergy, runny nose, weakness, dizziness, restless leg, decreased energy   ALLERGIES: Allergies  Allergen Reactions  . Candesartan Cilexetil     REACTION: Muscle spasms  . Diltiazem Hcl     REACTION: Dizziness  . Doxazosin Mesylate Other (See Comments)  REACTION: H/A's  . Nifedipine     REACTION: Leg swelling  . Pravastatin Other (See Comments)    Muscle aches  . Red Yeast Rice     Muscle aches  . Rosuvastatin Other (See Comments)    REACTION: questionable: severe constipation  . Sertraline Other (See Comments)    oversedation  . Simvastatin  Other (See Comments)    REACTION: Muscle aches  . Hydrocodone Other (See Comments)    constipation    HOME MEDICATIONS: Current Outpatient Prescriptions  Medication Sig Dispense Refill  . acetaminophen (TYLENOL) 500 MG tablet Take 500 mg by mouth 2 (two) times daily. Reported on 05/06/2016    . albuterol (PROVENTIL HFA;VENTOLIN HFA) 108 (90 BASE) MCG/ACT inhaler Inhale 2 puffs into the lungs every 6 (six) hours as needed for wheezing or shortness of breath. 1 Inhaler 3  . Alum & Mag Hydroxide-Simeth (MAGIC MOUTHWASH W/LIDOCAINE) SOLN Take 5 mLs by mouth 3 (three) times daily as needed for mouth pain. 5 mL 0  . apixaban (ELIQUIS) 5 MG TABS tablet Take 1 tablet (5 mg total) by mouth 2 (two) times daily. Restart when platelets >50.    . benzonatate (TESSALON) 100 MG capsule Take 1 capsule (100 mg total) by mouth 2 (two) times daily as needed for cough. 30 capsule 0  . dexamethasone (DECADRON) 4 MG tablet 2 tab po bid, the day before, day of and day after chemotherapy every 3 weeks (Patient taking differently: Take 4 mg by mouth 2 (two) times daily. 2 tab twice daily, the day before, day of and day after chemotherapy every 3 weeks) 40 tablet 1  . diphenoxylate-atropine (LOMOTIL) 2.5-0.025 MG per tablet Take 2 tablets by mouth 4 (four) times daily as needed for diarrhea or loose stools. 30 tablet 0  . glipiZIDE (GLUCOTROL) 5 MG tablet Take 1 tablet (5 mg total) by mouth as directed. Take one daily when taking steroid (dexamethasone).    Marland Kitchen glucose blood (ONE TOUCH ULTRA TEST) test strip Use to check sugar once daily and as needed. Dx: E09.9 100 each 3  . guaiFENesin (MUCINEX) 600 MG 12 hr tablet Take 600 mg by mouth 2 (two) times daily as needed for cough or to loosen phlegm.     . hydrocortisone (ANUSOL-HC) 25 MG suppository Place 1 suppository (25 mg total) rectally 2 (two) times daily. 12 suppository 0  . KLOR-CON M10 10 MEQ tablet Take 20 mEq by mouth daily as needed (with torsemide).   10  .  lidocaine-prilocaine (EMLA) cream Apply 1 application topically as needed. 30 g 1  . loratadine (CLARITIN) 10 MG tablet Take 10 mg by mouth as directed. Reported on 01/05/2016    . methocarbamol (ROBAXIN) 500 MG tablet Take 500 mg by mouth 3 (three) times daily as needed. Muscle pain    . metoprolol tartrate (LOPRESSOR) 50 MG tablet Take 0.5 tablets (25 mg total) by mouth 2 (two) times daily. 30 tablet 6  . Multiple Vitamins-Minerals (ICAPS) CAPS Take 1 capsule by mouth 2 (two) times daily.     [name] DELICA LANCETS 72Z MISC Use to check sugar once daily and as needed. Dx:E09.9 100 each 3  . polyethylene glycol (MIRALAX / GLYCOLAX) packet Take 8.5 g by mouth daily as needed for moderate constipation.     . potassium chloride (K-DUR) 10 MEQ tablet Take two tablets daily as needed with Torsemide. 180 tablet 3  . spironolactone (ALDACTONE) 50 MG tablet TAKE ONE TABLET BY MOUTH ONCE DAILY 30 tablet  6  . sucralfate (CARAFATE) 1 g tablet Take 1 tablet (1 g total) by mouth 3 (three) times daily before meals. 90 tablet 1  . torsemide (DEMADEX) 20 MG tablet Take 20 mg by mouth 2 (two) times daily. Takes one tablet daily and if swelling continues he takes a second pill    . traMADol (ULTRAM) 50 MG tablet Take 1 tablet (50 mg total) by mouth every 6 (six) hours as needed. 30 tablet 3  . triamcinolone cream (KENALOG) 0.1 % Apply 1 application topically 2 (two) times daily. Apply to AA. 60 g 0   No current facility-administered medications for this visit.    PAST MEDICAL HISTORY: Past Medical History  Diagnosis Date  . History of diabetes mellitus, type II     resolved with diet, h/o neuropathy  . Diverticulosis of colon   . Hyperlipidemia   . Hypertension   . Fatty liver 02/29/00    abd ultrasound  . Lower back pain   . Systolic murmur 7793    2Decho - normal LV fxn, EF 55%, mild AS, biatrial enlargement  . History of tobacco use quit 1990s  . Positive hepatitis C antibody test 2013    HCV RNA  negative - ?cleared infection  . 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 Personal history of colonic adenomas 07/06/2013  . Hypertensive retinopathy of both eyes 2015    mild  . Dysrhythmia     Atrial fib (found 07/2014)  . Shortness of breath   . Pneumonia   . GERD (gastroesophageal reflux disease)     occasional  . Arthritis   . Neuropathy ([place])   . Constipation   . 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 Depression   . Non-small cell carcinoma of left lung ([place]) 02/2014    stage IIIb/IV on chemo  . Full code status 09/26/2015  . Atrial fibrillation ([place])   . Lobar pneumonia ([place]) 10/18/2015  . HCAP (healthcare-associated pneumonia) 11/27/2015    IV vanc/zosyn then levaquin courses x2   . Diabetes mellitus type 2 with retinopathy ([place])   . Acute infarction of intestine, part and extent unspecified ([place]) 04/25/2016  . Stroke (cerebrum) ([place]) 04/25/2016    PAST SURGICAL HISTORY: Past Surgical History  Procedure Laterality Date  . Colonoscopy  2004  . Colonoscopy  2014    tubular adenoma x1, mod diverticulosis ([place])  . Flexible bronchoscopy  02/2014    WNL  . Video bronchoscopy Bilateral 03/17/2014    Procedure: VIDEO BRONCHOSCOPY WITH FLUORO;  Surgeon: Tanda Rockers, MD;  Location: [facility];  Service: Cardiopulmonary;  Laterality: Bilateral;  . Chest tube insertion Left 08/26/2014    Procedure: INSERTION OF LEFT  PLEURAL DRAINAGE CATHETER;  Surgeon: Ivin Poot, MD;  Location: [facility];  Service: Thoracic;  Laterality: Left;  . Cataract extraction Right 03/2015    Dr [name]  . Portacath placement Right 03/31/2015    Procedure: INSERTION PORT-A-CATH;  Surgeon: Ivin Poot, MD;  Location: [facility];  Service: Thoracic;  Laterality: Right;  . Removal of pleural drainage catheter Left 03/31/2015    Procedure: REMOVAL OF PLEURAL DRAINAGE CATHETER;  Surgeon: Ivin Poot, MD;  Location: [facility];  Service: Thoracic;   Laterality: Left;    FAMILY HISTORY: Family History  Problem Relation Age of Onset  . Stroke Mother     multiple mini strokes  . Hypertension Mother   .  Heart disease Maternal Grandfather     MI  . Stroke Paternal Grandmother   . Prostate cancer Father     SOCIAL HISTORY:  Social History   Social History  . Marital Status: Married    Spouse Name: N/A  . Number of Children: 2  . Years of Education: Bachelors   Occupational History  . [employer], picks up golf balls.  [employer]    Social History Main Topics  . Smoking status: Former Smoker -- 3.00 packs/day for 30 years    Types: Cigarettes    Quit date: 12/23/1988  . Smokeless tobacco: Never Used  . Alcohol Use: 12.6 oz/week    21 Glasses of wine per week     Comment: 3-4 wine a day ( 8/03- 2 beers, 2 wines, 2 brandies daily)  . Drug Use: No  . Sexual Activity: No   Other Topics Concern  . Not on file   Social History Narrative   Lives with wife.   [employer], failed eye exam, quit   Activity: active at gym regularly, golfing   Diet: healthy - oatmeal, avoids white starches   Right-handed.   1-2 cups caffeine per day.      Advanced directives: Full code. Wouldn't want prolonged artificial life support. Wife would be HCproxy     PHYSICAL EXAM   Filed Vitals:   07/04/16 1212  BP: 136/86  Pulse: 85  Height: '5\' 9"'$  (1.753 m)  Weight: 170 lb 8 oz (77.338 kg)    Not recorded      Body mass index is 25.17 kg/(m^2).  PHYSICAL EXAMNIATION:  Gen: NAD, conversant, well nourised, obese, well groomed                     Cardiovascular: Regular rate rhythm, no peripheral edema, warm, nontender. Eyes: Conjunctivae clear without exudates or hemorrhage Neck: Supple, no carotid bruise. Pulmonary: Clear to auscultation bilaterally   NEUROLOGICAL EXAM:  MENTAL STATUS: Speech:    Speech is normal; fluent and spontaneous with normal comprehension.  Cognition:     Orientation to time, place and person     Normal  recent and remote memory     Normal Attention span and concentration     Normal Language, naming, repeating,spontaneous speech     Fund of knowledge   CRANIAL NERVES: CN II: Visual fields are full to confrontation. Fundoscopic exam is normal with sharp discs and no vascular changes. Pupils are round equal and briskly reactive to light. CN III, IV, VI: extraocular movement are normal. No ptosis. CN V: Facial sensation is intact to pinprick in all 3 divisions bilaterally. Corneal responses are intact.  CN VII: Face is symmetric with normal eye closure and smile. CN VIII: Hearing is normal to rubbing fingers CN IX, X: Palate elevates symmetrically. Phonation is normal. CN XI: Head turning and shoulder shrug are intact CN XII: Tongue is midline with normal movements and no atrophy.  MOTOR: There is no pronator drift of out-stretched arms. Muscle bulk and tone are normal. Muscle strength is normal, with exception of mild to moderate bilateral toe flexion extension weakness.  REFLEXES: Reflexes are hypoactive and symmetric at the biceps, triceps, knees, and absent at ankles. Plantar responses are flexor.  SENSORY: Length dependent decreased to light touch, pinprick and vibratory sensation to bilateral knee level   COORDINATION: Rapid alternating movements and fine finger movements are intact. There is no dysmetria on finger-to-nose and heel-knee-shin.    GAIT/STANCE: Wide-based, mildly unsteady, could  not perform tiptoe heel or tandem walking,   DIAGNOSTIC DATA (LABS, IMAGING, TESTING) - I reviewed patient records, labs, notes, testing and imaging myself where available.   ASSESSMENT AND PLAN  Shawn Espinoza is a 75 y.o. male   Embolic stroke Atrial fibrillation, on Eliquis 5 mg twice a day Diabetic peripheral neuropathy Stage IV non-small cell lung cancer  I have personally reviewed MRI of the brain, there is evidence of small embolic stroke involving anterior/posterior  circulation  I also discussed with vascular specialist Dr. Leonie Man, with his mild unsteady gait, diabetic peripheral neuropathy, there  Is increased chance of bleeding complication with combination therapy of aspirin and anticoagulation Eliquis 5 mg, the risk might be more than the benefit  After discuss with patient, we decided to keep him on Eliquis 5 mg bid, stop ASA '81mg'$  daily.  Ultrasound of carotid arteries showed no significant large vessel disease,  I encouraged him continue moderate exercise, keep Eliquis,  Only return to clinic for new issues.   Marcial Pacas, M.D. Ph.D.  [facility] [address] [place], [place] [address] Ph: [phone] Fax: [phone]  CC: [name], MD

## 2016-07-10 ENCOUNTER — Ambulatory Visit: Payer: Medicare Other

## 2016-07-10 ENCOUNTER — Ambulatory Visit (HOSPITAL_BASED_OUTPATIENT_CLINIC_OR_DEPARTMENT_OTHER): Payer: Medicare Other

## 2016-07-10 ENCOUNTER — Other Ambulatory Visit (HOSPITAL_BASED_OUTPATIENT_CLINIC_OR_DEPARTMENT_OTHER): Payer: Medicare Other

## 2016-07-10 ENCOUNTER — Ambulatory Visit (HOSPITAL_BASED_OUTPATIENT_CLINIC_OR_DEPARTMENT_OTHER): Payer: Medicare Other | Admitting: Internal Medicine

## 2016-07-10 ENCOUNTER — Encounter: Payer: Self-pay | Admitting: Internal Medicine

## 2016-07-10 VITALS — BP 124/70 | HR 79 | Temp 98.1°F | Resp 18 | Ht 69.0 in | Wt 172.2 lb

## 2016-07-10 DIAGNOSIS — C3492 Malignant neoplasm of unspecified part of left bronchus or lung: Secondary | ICD-10-CM

## 2016-07-10 DIAGNOSIS — Z5111 Encounter for antineoplastic chemotherapy: Secondary | ICD-10-CM

## 2016-07-10 DIAGNOSIS — I63522 Cerebral infarction due to unspecified occlusion or stenosis of left anterior cerebral artery: Secondary | ICD-10-CM | POA: Diagnosis not present

## 2016-07-10 DIAGNOSIS — C3411 Malignant neoplasm of upper lobe, right bronchus or lung: Secondary | ICD-10-CM | POA: Diagnosis not present

## 2016-07-10 DIAGNOSIS — C3491 Malignant neoplasm of unspecified part of right bronchus or lung: Secondary | ICD-10-CM

## 2016-07-10 DIAGNOSIS — I4891 Unspecified atrial fibrillation: Secondary | ICD-10-CM

## 2016-07-10 DIAGNOSIS — R609 Edema, unspecified: Secondary | ICD-10-CM

## 2016-07-10 DIAGNOSIS — C3412 Malignant neoplasm of upper lobe, left bronchus or lung: Secondary | ICD-10-CM

## 2016-07-10 DIAGNOSIS — K769 Liver disease, unspecified: Secondary | ICD-10-CM

## 2016-07-10 DIAGNOSIS — J90 Pleural effusion, not elsewhere classified: Secondary | ICD-10-CM

## 2016-07-10 DIAGNOSIS — Z95828 Presence of other vascular implants and grafts: Secondary | ICD-10-CM

## 2016-07-10 DIAGNOSIS — D61818 Other pancytopenia: Secondary | ICD-10-CM

## 2016-07-10 LAB — CBC WITH DIFFERENTIAL/PLATELET
BASO%: 0.9 % (ref 0.0–2.0)
BASOS ABS: 0 10*3/uL (ref 0.0–0.1)
EOS ABS: 0 10*3/uL (ref 0.0–0.5)
EOS%: 0.4 % (ref 0.0–7.0)
HEMATOCRIT: 33.8 % — AB (ref 38.4–49.9)
HGB: 11.4 g/dL — ABNORMAL LOW (ref 13.0–17.1)
LYMPH#: 0.6 10*3/uL — AB (ref 0.9–3.3)
LYMPH%: 12 % — ABNORMAL LOW (ref 14.0–49.0)
MCH: 31.8 pg (ref 27.2–33.4)
MCHC: 33.8 g/dL (ref 32.0–36.0)
MCV: 94 fL (ref 79.3–98.0)
MONO#: 0.5 10*3/uL (ref 0.1–0.9)
MONO%: 10.2 % (ref 0.0–14.0)
NEUT#: 3.5 10*3/uL (ref 1.5–6.5)
NEUT%: 76.5 % — AB (ref 39.0–75.0)
PLATELETS: 153 10*3/uL (ref 140–400)
RBC: 3.6 10*6/uL — ABNORMAL LOW (ref 4.20–5.82)
RDW: 18.3 % — ABNORMAL HIGH (ref 11.0–14.6)
WBC: 4.6 10*3/uL (ref 4.0–10.3)

## 2016-07-10 LAB — TECHNOLOGIST REVIEW

## 2016-07-10 LAB — COMPREHENSIVE METABOLIC PANEL
ALT: 25 U/L (ref 0–55)
ANION GAP: 8 meq/L (ref 3–11)
AST: 25 U/L (ref 5–34)
Albumin: 3.2 g/dL — ABNORMAL LOW (ref 3.5–5.0)
Alkaline Phosphatase: 89 U/L (ref 40–150)
BUN: 27.5 mg/dL — ABNORMAL HIGH (ref 7.0–26.0)
CALCIUM: 8.6 mg/dL (ref 8.4–10.4)
CHLORIDE: 105 meq/L (ref 98–109)
CO2: 25 mEq/L (ref 22–29)
CREATININE: 1.2 mg/dL (ref 0.7–1.3)
EGFR: 59 mL/min/{1.73_m2} — ABNORMAL LOW (ref >90)
Glucose: 210 mg/dl — ABNORMAL HIGH (ref 70–140)
Potassium: 4.8 mEq/L (ref 3.5–5.1)
Sodium: 138 mEq/L (ref 136–145)
Total Bilirubin: 0.76 mg/dL (ref 0.20–1.20)
Total Protein: 6.1 g/dL — ABNORMAL LOW (ref 6.4–8.3)

## 2016-07-10 MED ORDER — HEPARIN SOD (PORK) LOCK FLUSH 100 UNIT/ML IV SOLN
500.0000 [IU] | Freq: Once | INTRAVENOUS | Status: AC | PRN
  Administered 2016-07-10: 500 [IU]
  Filled 2016-07-10: qty 5

## 2016-07-10 MED ORDER — SODIUM CHLORIDE 0.9% FLUSH
10.0000 mL | INTRAVENOUS | Status: DC | PRN
  Administered 2016-07-10: 10 mL
  Filled 2016-07-10: qty 10

## 2016-07-10 MED ORDER — DEXAMETHASONE SODIUM PHOSPHATE 100 MG/10ML IJ SOLN
10.0000 mg | Freq: Once | INTRAMUSCULAR | Status: AC
  Administered 2016-07-10: 10 mg via INTRAVENOUS
  Filled 2016-07-10: qty 1

## 2016-07-10 MED ORDER — SODIUM CHLORIDE 0.9 % IV SOLN
25.0000 mg/m2 | Freq: Once | INTRAVENOUS | Status: AC
  Administered 2016-07-10: 50 mg via INTRAVENOUS
  Filled 2016-07-10: qty 5

## 2016-07-10 MED ORDER — SODIUM CHLORIDE 0.9 % IJ SOLN
10.0000 mL | INTRAMUSCULAR | Status: AC | PRN
  Administered 2016-07-10: 10 mL
  Filled 2016-07-10: qty 10

## 2016-07-10 MED ORDER — SODIUM CHLORIDE 0.9 % IJ SOLN
10.0000 mL | INTRAMUSCULAR | Status: DC | PRN
  Filled 2016-07-10: qty 10

## 2016-07-10 MED ORDER — SODIUM CHLORIDE 0.9 % IV SOLN
Freq: Once | INTRAVENOUS | Status: AC
  Administered 2016-07-10: 12:00:00 via INTRAVENOUS

## 2016-07-10 NOTE — Patient Instructions (Signed)

## 2016-07-10 NOTE — Patient Instructions (Signed)
[facility] Discharge Instructions for Patients Receiving Chemotherapy  Today you received the following chemotherapy agents:  Taxotere.  To help prevent nausea and vomiting after your treatment, we encourage you to take your nausea medication as directed.   If you develop nausea and vomiting that is not controlled by your nausea medication, call the clinic.   BELOW ARE SYMPTOMS THAT SHOULD BE REPORTED IMMEDIATELY:  *FEVER GREATER THAN 100.5 F  *CHILLS WITH OR WITHOUT FEVER  NAUSEA AND VOMITING THAT IS NOT CONTROLLED WITH YOUR NAUSEA MEDICATION  *UNUSUAL SHORTNESS OF BREATH  *UNUSUAL BRUISING OR BLEEDING  TENDERNESS IN MOUTH AND THROAT WITH OR WITHOUT PRESENCE OF ULCERS  *URINARY PROBLEMS  *BOWEL PROBLEMS  UNUSUAL RASH Items with * indicate a potential emergency and should be followed up as soon as possible.  Feel free to call the clinic you have any questions or concerns. The clinic phone number is [phone].  Please show the CHEMO ALERT CARD at check-in to the Emergency Department and triage nurse.  

## 2016-07-10 NOTE — Progress Notes (Signed)
[facility] [phone]   Fax:([phone]) [place], [place] [address]  DIAGNOSIS: Stage IIIB/IV non-small cell lung cancer, adenocarcinoma diagnosed in March of 2015.  PRIOR THERAPY:  1) Systemic chemotherapy with carboplatin for AUC of 5 and Alimta 500 mg/M2 every 3 weeks. First dose expected on 04/13/2014. Status post 6 cycles. 2)  Maintenance chemotherapy with single agent Alimta 500 mg/M2 every 3 weeks. First cycle on 09/06/2014. Status post 3 cycles. 3) Second course of systemic chemotherapy with carboplatin for AUC of 5 and Alimta 500 MG/M2 every 3 weeks. First cycle 11/08/2014. Status post 6 cycles. 4)  Immunotherapy with Nivolumab 3 MG/KG every 2 weeks. First dose 05/24/2015. Status post 3 cycles. 5) Systemic chemotherapy with docetaxel 60 MG/M2 and Cyramza 10 MG/KG every 3 weeks status post 6 cycles. Last dose was given 11/21/2015 with stable disease. 6) Systemic chemotherapy with docetaxel 75 MG/M2 every 3 weeks. First cycle 05/08/2016. Status post one cycle. Discontinued secondary to intolerance to the Neulasta injection.  CURRENT THERAPY: Systemic chemotherapy with docetaxel 25 MG/M2 every weekly. First cycle 05/29/2016. Status post 6 cycles.  INTERVAL HISTORY: Shawn Espinoza 75 y.o. male returns to the clinic today for follow up visit. The patient is feeling fine today with no specific complaints. He is currently on weekly docetaxel and tolerating his treatment well. He continues to have mild shoulder pain. He denied having any significant chest pain, shortness of breath, cough or hemoptysis. He has no nausea or vomiting, no fever or chills. He is here today to start cycle #7 of his weekly docetaxel.  MEDICAL HISTORY: Past Medical History  Diagnosis Date  . History of diabetes mellitus, type II     resolved with diet, h/o neuropathy  . Diverticulosis of colon   .  Hyperlipidemia   . Hypertension   . Fatty liver 02/29/00    abd ultrasound  . Lower back pain   . Systolic murmur 4166    2Decho - normal LV fxn, EF 55%, mild AS, biatrial enlargement  . History of tobacco use quit 1990s  . 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 Personal history of colonic adenomas 07/06/2013  . Hypertensive retinopathy of both eyes 2015    mild  . Dysrhythmia     Atrial fib (found 07/2014)  . Shortness of breath   . Pneumonia   . GERD (gastroesophageal reflux disease)     occasional  . Arthritis   . Neuropathy ([place])   . Constipation   . 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 Depression   . Non-small cell carcinoma of left lung ([place]) 02/2014    stage IIIb/IV on chemo  . Full code status 09/26/2015  . Atrial fibrillation ([place])   . Lobar pneumonia ([place]) 10/18/2015  . HCAP (healthcare-associated pneumonia) 11/27/2015    IV vanc/zosyn then levaquin courses x2   . Diabetes mellitus type 2 with retinopathy ([place])   . Acute infarction of intestine, part and extent unspecified ([place]) 04/25/2016  . 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  MEDICATIONS:  Current Outpatient Prescriptions  Medication Sig Dispense Refill  .  acetaminophen (TYLENOL) 500 MG tablet Take 500 mg by mouth 2 (two) times daily. Reported on 05/06/2016    . albuterol (PROVENTIL HFA;VENTOLIN HFA) 108 (90 BASE) MCG/ACT inhaler Inhale 2 puffs into the lungs every 6 (six) hours as needed for wheezing or shortness of breath. 1 Inhaler 3  . Alum & Mag Hydroxide-Simeth (MAGIC MOUTHWASH W/LIDOCAINE) SOLN Take 5 mLs by mouth 3 (three) times daily as needed for mouth pain. 5 mL 0  .  apixaban (ELIQUIS) 5 MG TABS tablet Take 1 tablet (5 mg total) by mouth 2 (two) times daily. Restart when platelets >50.    . benzonatate (TESSALON) 100 MG capsule Take 1 capsule (100 mg total) by mouth 2 (two) times daily as needed for cough. 30 capsule 0  . dexamethasone (DECADRON) 4 MG tablet 2 tab po bid, the day before, day of and day after chemotherapy every 3 weeks (Patient taking differently: Take 4 mg by mouth 2 (two) times daily. 2 tab twice daily, the day before, day of and day after chemotherapy every 3 weeks) 40 tablet 1  . diphenoxylate-atropine (LOMOTIL) 2.5-0.025 MG per tablet Take 2 tablets by mouth 4 (four) times daily as needed for diarrhea or loose stools. 30 tablet 0  . glipiZIDE (GLUCOTROL) 5 MG tablet Take 1 tablet (5 mg total) by mouth as directed. Take one daily when taking steroid (dexamethasone).    Marland Kitchen glucose blood (ONE TOUCH ULTRA TEST) test strip Use to check sugar once daily and as needed. Dx: E09.9 100 each 3  . guaiFENesin (MUCINEX) 600 MG 12 hr tablet Take 600 mg by mouth 2 (two) times daily as needed for cough or to loosen phlegm.     . hydrocortisone (ANUSOL-HC) 25 MG suppository Place 1 suppository (25 mg total) rectally 2 (two) times daily. 12 suppository 0  . KLOR-CON M10 10 MEQ tablet Take 20 mEq by mouth daily as needed (with torsemide).   10  . lidocaine-prilocaine (EMLA) cream Apply 1 application topically as needed. 30 g 1  . loratadine (CLARITIN) 10 MG tablet Take 10 mg by mouth as directed. Reported on 01/05/2016    . methocarbamol (ROBAXIN) 500 MG tablet Take 500 mg by mouth 3 (three) times daily as needed. Muscle pain    . metoprolol tartrate (LOPRESSOR) 50 MG tablet Take 0.5 tablets (25 mg total) by mouth 2 (two) times daily. 30 tablet 6  . Multiple Vitamins-Minerals (ICAPS) CAPS Take 1 capsule by mouth 2 (two) times daily.     [name] DELICA LANCETS 95J MISC Use to check sugar once daily and as needed. Dx:E09.9 100 each 3  . polyethylene glycol  (MIRALAX / GLYCOLAX) packet Take 8.5 g by mouth daily as needed for moderate constipation.     . potassium chloride (K-DUR) 10 MEQ tablet Take two tablets daily as needed with Torsemide. 180 tablet 3  . spironolactone (ALDACTONE) 50 MG tablet TAKE ONE TABLET BY MOUTH ONCE DAILY 30 tablet 6  . sucralfate (CARAFATE) 1 g tablet Take 1 tablet (1 g total) by mouth 3 (three) times daily before meals. 90 tablet 1  . torsemide (DEMADEX) 20 MG tablet Take 20 mg by mouth 2 (two) times daily. Takes one tablet daily and if swelling continues he takes a second pill    . traMADol (ULTRAM) 50 MG tablet Take 1 tablet (50 mg total) by mouth every 6 (six) hours as needed. 30 tablet 3  . triamcinolone cream (KENALOG) 0.1 % Apply 1 application topically 2 (two) times  daily. Apply to AA. 60 g 0   No current facility-administered medications for this visit.    SURGICAL HISTORY:  Past Surgical History  Procedure Laterality Date  . Colonoscopy  2004  . Colonoscopy  2014    tubular adenoma x1, mod diverticulosis ([place])  . Flexible bronchoscopy  02/2014    WNL  . Video bronchoscopy Bilateral 03/17/2014    Procedure: VIDEO BRONCHOSCOPY WITH FLUORO;  Surgeon: Tanda Rockers, MD;  Location: [facility];  Service: Cardiopulmonary;  Laterality: Bilateral;  . Chest tube insertion Left 08/26/2014    Procedure: INSERTION OF LEFT  PLEURAL DRAINAGE CATHETER;  Surgeon: Ivin Poot, MD;  Location: [facility];  Service: Thoracic;  Laterality: Left;  . Cataract extraction Right 03/2015    Dr [name]  . Portacath placement Right 03/31/2015    Procedure: INSERTION PORT-A-CATH;  Surgeon: Ivin Poot, MD;  Location: [facility];  Service: Thoracic;  Laterality: Right;  . Removal of pleural drainage catheter Left 03/31/2015    Procedure: REMOVAL OF PLEURAL DRAINAGE CATHETER;  Surgeon: Ivin Poot, MD;  Location: [facility];  Service: Thoracic;  Laterality: Left;    REVIEW OF SYSTEMS:  A comprehensive review of systems was negative except  for: Constitutional: positive for fatigue Musculoskeletal: positive for arthralgias   PHYSICAL EXAMINATION: General appearance: alert, cooperative, fatigued and no distress Head: Normocephalic, without obvious abnormality, atraumatic Neck: no adenopathy, no JVD, supple, symmetrical, trachea midline and thyroid not enlarged, symmetric, no tenderness/mass/nodules Lymph nodes: Cervical, supraclavicular, and axillary nodes normal. Resp: diminished breath sounds LLL and dullness to percussion LLL Back: symmetric, no curvature. ROM normal. No CVA tenderness. Cardio: regular rate and rhythm, S1, S2 normal, no murmur, click, rub or gallop GI: soft, non-tender; bowel sounds normal; no masses,  no organomegaly Extremities: edema 2+ Neurologic: Alert and oriented X 3, normal strength and tone. Normal symmetric reflexes. Normal coordination and gait  ECOG PERFORMANCE STATUS: 1 - Symptomatic but completely ambulatory  Blood pressure 124/70, pulse 79, temperature 98.1 F (36.7 C), temperature source Oral, resp. rate 18, height '5\' 9"'$  (1.753 m), weight 172 lb 3.2 oz (78.109 kg), SpO2 99 %.  LABORATORY DATA: Lab Results  Component Value Date   WBC 4.6 07/10/2016   HGB 11.4* 07/10/2016   HCT 33.8* 07/10/2016   MCV 94.0 07/10/2016   PLT 153 07/10/2016      Chemistry      Component Value Date/Time   NA 138 07/10/2016 1016   NA 139 02/13/2016 1405   K 4.8 07/10/2016 1016   K 5.1 02/13/2016 1405   CL 103 02/13/2016 1405   CO2 25 07/10/2016 1016   CO2 29 02/13/2016 1405   BUN 27.5* 07/10/2016 1016   BUN 27* 02/13/2016 1405   CREATININE 1.2 07/10/2016 1016   CREATININE 1.11 02/13/2016 1405   CREATININE 0.83 02/22/2014 1314      Component Value Date/Time   CALCIUM 8.6 07/10/2016 1016   CALCIUM 9.2 02/13/2016 1405   ALKPHOS 89 07/10/2016 1016   ALKPHOS 57 02/13/2016 1405   AST 25 07/10/2016 1016   AST 16 02/13/2016 1405   ALT 25 07/10/2016 1016   ALT 12 02/13/2016 1405   BILITOT 0.76  07/10/2016 1016   BILITOT 0.5 02/13/2016 1405       RADIOGRAPHIC STUDIES: No results found. ASSESSMENT AND PLAN: this is a very pleasant 75 years old white male with:  1) Stage IV non-small cell lung cancer, adenocarcinoma presenting with large right upper lobe lung mass in  addition to mediastinal and bilateral hilar lymphadenopathy as well as cervical lymphadenopathy and left pleural effusion. The patient completed a course systemic chemotherapy with carboplatin and Alimta status post 6 cycles. This was followed by 3 cycles of maintenance chemotherapy with single agent Alimta. The patient tolerated his treatment well. He had evidence for disease progression and the patient was started on treatment again with carboplatin and Alimta status post 6 cycles. He was treated with 4 cycles of immunotherapy with Nivolumab discontinued secondary to disease progression. The patient is currently on systemic chemotherapy with docetaxel and Cyramza status post 6 cycles and tolerated the treatment well except for fatigue and feels admission with recurrent pneumonia.  He has been on observation for the last 6 months. Unfortunately the recent CT scan of the chest, abdomen and pelvis showed evidence for disease progression with increase in the mediastinal lymphadenopathy as well as increase in size of retrocrural lymph node and gastrohepatic lymphadenopathy. He was started on systemic chemotherapy with docetaxel 75 MG/M2 every 3 weeks with Neulasta support. The patient tolerated the first cycle well except for the significant pain and arthralgias with the Neulasta injection and he doesn't think that he can continue with the same regimen including the Neulasta injection. I switched his treatment to docetaxel 25 MG/M2 on weekly basis and he is tolerating it much better. He is status post 6 weekly doses. I recommended for the patient to proceed with cycle #7 today as a scheduled.  2) Atrial fibrillation: continue  Eliquis.  3) lower extremity edema: Improved. Continue Demadex.  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 I contacted his primary care physician office for management of this condition since they ordered the MRI of the brain. I recommended for the patient to continue his current treatment to his Eliquis and advise him to start taking aspirin daily until he sees a neurologist.  5) CODE STATUS: Previously discussed with the patient and his wife. He would like initial resuscitation but no prolonged support if in vegetative state.  He would come back for follow-up visit in 3 weeks for reevaluation with repeat CT scan of the chest, abdomen and pelvis for restaging of his disease.  He was advised to call immediately if he has any concerning symptoms in the interval.  The patient voices understanding of current disease status and treatment options and is in agreement with the current care plan.  All questions were answered. The patient knows to call the clinic with any problems, questions or concerns. We can certainly see the patient much sooner if necessary.  Disclaimer: This note was dictated with voice recognition software. Similar sounding words can inadvertently be transcribed and may not be corrected upon review.

## 2016-07-12 ENCOUNTER — Ambulatory Visit: Payer: Medicare Other

## 2016-07-17 ENCOUNTER — Ambulatory Visit: Payer: Medicare Other

## 2016-07-17 ENCOUNTER — Other Ambulatory Visit (HOSPITAL_BASED_OUTPATIENT_CLINIC_OR_DEPARTMENT_OTHER): Payer: Medicare Other

## 2016-07-17 ENCOUNTER — Ambulatory Visit (HOSPITAL_BASED_OUTPATIENT_CLINIC_OR_DEPARTMENT_OTHER): Payer: Medicare Other

## 2016-07-17 VITALS — BP 147/82 | HR 76 | Temp 97.7°F | Resp 16

## 2016-07-17 DIAGNOSIS — C3492 Malignant neoplasm of unspecified part of left bronchus or lung: Secondary | ICD-10-CM

## 2016-07-17 DIAGNOSIS — Z5111 Encounter for antineoplastic chemotherapy: Secondary | ICD-10-CM

## 2016-07-17 DIAGNOSIS — K769 Liver disease, unspecified: Secondary | ICD-10-CM

## 2016-07-17 DIAGNOSIS — C3411 Malignant neoplasm of upper lobe, right bronchus or lung: Secondary | ICD-10-CM | POA: Diagnosis present

## 2016-07-17 DIAGNOSIS — Z95828 Presence of other vascular implants and grafts: Secondary | ICD-10-CM

## 2016-07-17 DIAGNOSIS — C3491 Malignant neoplasm of unspecified part of right bronchus or lung: Secondary | ICD-10-CM

## 2016-07-17 LAB — CBC WITH DIFFERENTIAL/PLATELET
BASO%: 1 % (ref 0.0–2.0)
BASOS ABS: 0 10*3/uL (ref 0.0–0.1)
EOS ABS: 0 10*3/uL (ref 0.0–0.5)
EOS%: 0.3 % (ref 0.0–7.0)
HCT: 33.5 % — ABNORMAL LOW (ref 38.4–49.9)
HEMOGLOBIN: 11.2 g/dL — AB (ref 13.0–17.1)
LYMPH%: 12.5 % — AB (ref 14.0–49.0)
MCH: 31.5 pg (ref 27.2–33.4)
MCHC: 33.4 g/dL (ref 32.0–36.0)
MCV: 94.6 fL (ref 79.3–98.0)
MONO#: 0.5 10*3/uL (ref 0.1–0.9)
MONO%: 10.1 % (ref 0.0–14.0)
NEUT%: 76.1 % — ABNORMAL HIGH (ref 39.0–75.0)
NEUTROS ABS: 3.4 10*3/uL (ref 1.5–6.5)
PLATELETS: 141 10*3/uL (ref 140–400)
RBC: 3.54 10*6/uL — AB (ref 4.20–5.82)
RDW: 18 % — ABNORMAL HIGH (ref 11.0–14.6)
WBC: 4.5 10*3/uL (ref 4.0–10.3)
lymph#: 0.6 10*3/uL — ABNORMAL LOW (ref 0.9–3.3)

## 2016-07-17 LAB — COMPREHENSIVE METABOLIC PANEL
ALK PHOS: 95 U/L (ref 40–150)
ALT: 26 U/L (ref 0–55)
ANION GAP: 9 meq/L (ref 3–11)
AST: 24 U/L (ref 5–34)
Albumin: 3.2 g/dL — ABNORMAL LOW (ref 3.5–5.0)
BILIRUBIN TOTAL: 0.65 mg/dL (ref 0.20–1.20)
BUN: 30.8 mg/dL — AB (ref 7.0–26.0)
CO2: 24 mEq/L (ref 22–29)
CREATININE: 1.2 mg/dL (ref 0.7–1.3)
Calcium: 8.9 mg/dL (ref 8.4–10.4)
Chloride: 107 mEq/L (ref 98–109)
EGFR: 59 mL/min/{1.73_m2} — AB (ref >90)
GLUCOSE: 197 mg/dL — AB (ref 70–140)
POTASSIUM: 5 meq/L (ref 3.5–5.1)
SODIUM: 140 meq/L (ref 136–145)
TOTAL PROTEIN: 6 g/dL — AB (ref 6.4–8.3)

## 2016-07-17 MED ORDER — DOCETAXEL CHEMO INJECTION 160 MG/16ML
25.0000 mg/m2 | Freq: Once | INTRAVENOUS | Status: AC
  Administered 2016-07-17: 50 mg via INTRAVENOUS
  Filled 2016-07-17: qty 5

## 2016-07-17 MED ORDER — HEPARIN SOD (PORK) LOCK FLUSH 100 UNIT/ML IV SOLN
500.0000 [IU] | Freq: Once | INTRAVENOUS | Status: AC | PRN
  Administered 2016-07-17: 500 [IU]
  Filled 2016-07-17: qty 5

## 2016-07-17 MED ORDER — SODIUM CHLORIDE 0.9 % IJ SOLN
10.0000 mL | INTRAMUSCULAR | Status: AC | PRN
  Administered 2016-07-17: 10 mL
  Filled 2016-07-17: qty 10

## 2016-07-17 MED ORDER — SODIUM CHLORIDE 0.9 % IV SOLN
10.0000 mg | Freq: Once | INTRAVENOUS | Status: AC
  Administered 2016-07-17: 10 mg via INTRAVENOUS
  Filled 2016-07-17: qty 1

## 2016-07-17 MED ORDER — SODIUM CHLORIDE 0.9% FLUSH
10.0000 mL | INTRAVENOUS | Status: DC | PRN
  Administered 2016-07-17: 10 mL
  Filled 2016-07-17: qty 10

## 2016-07-17 MED ORDER — SODIUM CHLORIDE 0.9 % IV SOLN
Freq: Once | INTRAVENOUS | Status: AC
  Administered 2016-07-17: 13:00:00 via INTRAVENOUS

## 2016-07-17 NOTE — Patient Instructions (Signed)
[facility] Discharge Instructions for Patients Receiving Chemotherapy  Today you received the following chemotherapy agents Taxotere  To help prevent nausea and vomiting after your treatment, we encourage you to take your nausea medication.   If you develop nausea and vomiting that is not controlled by your nausea medication, call the clinic.   BELOW ARE SYMPTOMS THAT SHOULD BE REPORTED IMMEDIATELY:  *FEVER GREATER THAN 100.5 F  *CHILLS WITH OR WITHOUT FEVER  NAUSEA AND VOMITING THAT IS NOT CONTROLLED WITH YOUR NAUSEA MEDICATION  *UNUSUAL SHORTNESS OF BREATH  *UNUSUAL BRUISING OR BLEEDING  TENDERNESS IN MOUTH AND THROAT WITH OR WITHOUT PRESENCE OF ULCERS  *URINARY PROBLEMS  *BOWEL PROBLEMS  UNUSUAL RASH Items with * indicate a potential emergency and should be followed up as soon as possible.  Feel free to call the clinic you have any questions or concerns. The clinic phone number is [phone].  Please show the [facility] at check-in to the Emergency Department and triage nurse.

## 2016-07-24 ENCOUNTER — Other Ambulatory Visit (HOSPITAL_BASED_OUTPATIENT_CLINIC_OR_DEPARTMENT_OTHER): Payer: Medicare Other

## 2016-07-24 ENCOUNTER — Ambulatory Visit: Payer: Medicare Other

## 2016-07-24 ENCOUNTER — Ambulatory Visit (HOSPITAL_BASED_OUTPATIENT_CLINIC_OR_DEPARTMENT_OTHER): Payer: Medicare Other

## 2016-07-24 VITALS — BP 130/70 | HR 74 | Temp 97.5°F | Resp 18

## 2016-07-24 DIAGNOSIS — Z5111 Encounter for antineoplastic chemotherapy: Secondary | ICD-10-CM

## 2016-07-24 DIAGNOSIS — Z95828 Presence of other vascular implants and grafts: Secondary | ICD-10-CM

## 2016-07-24 DIAGNOSIS — C3411 Malignant neoplasm of upper lobe, right bronchus or lung: Secondary | ICD-10-CM | POA: Diagnosis present

## 2016-07-24 DIAGNOSIS — K769 Liver disease, unspecified: Secondary | ICD-10-CM

## 2016-07-24 DIAGNOSIS — C3492 Malignant neoplasm of unspecified part of left bronchus or lung: Secondary | ICD-10-CM

## 2016-07-24 DIAGNOSIS — C3491 Malignant neoplasm of unspecified part of right bronchus or lung: Secondary | ICD-10-CM

## 2016-07-24 LAB — COMPREHENSIVE METABOLIC PANEL
ALT: 26 U/L (ref 0–55)
AST: 29 U/L (ref 5–34)
Albumin: 3.2 g/dL — ABNORMAL LOW (ref 3.5–5.0)
Alkaline Phosphatase: 90 U/L (ref 40–150)
Anion Gap: 8 mEq/L (ref 3–11)
BUN: 26.9 mg/dL — AB (ref 7.0–26.0)
CHLORIDE: 108 meq/L (ref 98–109)
CO2: 24 meq/L (ref 22–29)
CREATININE: 1.2 mg/dL (ref 0.7–1.3)
Calcium: 8.9 mg/dL (ref 8.4–10.4)
EGFR: 57 mL/min/{1.73_m2} — ABNORMAL LOW (ref >90)
Glucose: 234 mg/dl — ABNORMAL HIGH (ref 70–140)
POTASSIUM: 4.8 meq/L (ref 3.5–5.1)
SODIUM: 140 meq/L (ref 136–145)
Total Bilirubin: 0.83 mg/dL (ref 0.20–1.20)
Total Protein: 6 g/dL — ABNORMAL LOW (ref 6.4–8.3)

## 2016-07-24 LAB — CBC WITH DIFFERENTIAL/PLATELET
BASO%: 0.9 % (ref 0.0–2.0)
Basophils Absolute: 0 10*3/uL (ref 0.0–0.1)
EOS%: 0.4 % (ref 0.0–7.0)
Eosinophils Absolute: 0 10*3/uL (ref 0.0–0.5)
HCT: 35 % — ABNORMAL LOW (ref 38.4–49.9)
HGB: 11.7 g/dL — ABNORMAL LOW (ref 13.0–17.1)
LYMPH#: 0.5 10*3/uL — AB (ref 0.9–3.3)
LYMPH%: 12.1 % — AB (ref 14.0–49.0)
MCH: 32 pg (ref 27.2–33.4)
MCHC: 33.3 g/dL (ref 32.0–36.0)
MCV: 96 fL (ref 79.3–98.0)
MONO#: 0.5 10*3/uL (ref 0.1–0.9)
MONO%: 10.3 % (ref 0.0–14.0)
NEUT#: 3.4 10*3/uL (ref 1.5–6.5)
NEUT%: 76.3 % — AB (ref 39.0–75.0)
Platelets: 154 10*3/uL (ref 140–400)
RBC: 3.65 10*6/uL — AB (ref 4.20–5.82)
RDW: 18.7 % — ABNORMAL HIGH (ref 11.0–14.6)
WBC: 4.5 10*3/uL (ref 4.0–10.3)

## 2016-07-24 MED ORDER — SODIUM CHLORIDE 0.9% FLUSH
10.0000 mL | INTRAVENOUS | Status: DC | PRN
Start: 2016-07-24 — End: 2016-07-24
  Administered 2016-07-24: 10 mL
  Filled 2016-07-24: qty 10

## 2016-07-24 MED ORDER — SODIUM CHLORIDE 0.9 % IV SOLN
25.0000 mg/m2 | Freq: Once | INTRAVENOUS | Status: AC
  Administered 2016-07-24: 50 mg via INTRAVENOUS
  Filled 2016-07-24: qty 5

## 2016-07-24 MED ORDER — SODIUM CHLORIDE 0.9 % IV SOLN
10.0000 mg | Freq: Once | INTRAVENOUS | Status: AC
  Administered 2016-07-24: 10 mg via INTRAVENOUS
  Filled 2016-07-24: qty 1

## 2016-07-24 MED ORDER — SODIUM CHLORIDE 0.9 % IV SOLN
Freq: Once | INTRAVENOUS | Status: AC
  Administered 2016-07-24: 12:00:00 via INTRAVENOUS

## 2016-07-24 MED ORDER — SODIUM CHLORIDE 0.9 % IJ SOLN
10.0000 mL | INTRAMUSCULAR | Status: AC | PRN
  Administered 2016-07-24: 10 mL
  Filled 2016-07-24: qty 10

## 2016-07-24 MED ORDER — HEPARIN SOD (PORK) LOCK FLUSH 100 UNIT/ML IV SOLN
500.0000 [IU] | Freq: Once | INTRAVENOUS | Status: AC | PRN
  Administered 2016-07-24: 500 [IU]
  Filled 2016-07-24: qty 5

## 2016-07-24 NOTE — Patient Instructions (Signed)
[facility] Discharge Instructions for Patients Receiving Chemotherapy  Today you received the following chemotherapy agents Taxotere  To help prevent nausea and vomiting after your treatment, we encourage you to take your nausea medication    If you develop nausea and vomiting that is not controlled by your nausea medication, call the clinic.   BELOW ARE SYMPTOMS THAT SHOULD BE REPORTED IMMEDIATELY:  *FEVER GREATER THAN 100.5 F  *CHILLS WITH OR WITHOUT FEVER  NAUSEA AND VOMITING THAT IS NOT CONTROLLED WITH YOUR NAUSEA MEDICATION  *UNUSUAL SHORTNESS OF BREATH  *UNUSUAL BRUISING OR BLEEDING  TENDERNESS IN MOUTH AND THROAT WITH OR WITHOUT PRESENCE OF ULCERS  *URINARY PROBLEMS  *BOWEL PROBLEMS  UNUSUAL RASH Items with * indicate a potential emergency and should be followed up as soon as possible.  Feel free to call the clinic you have any questions or concerns. The clinic phone number is [phone].  Please show the [facility] at check-in to the Emergency Department and triage nurse.

## 2016-07-24 NOTE — Patient Instructions (Signed)

## 2016-07-29 ENCOUNTER — Ambulatory Visit (HOSPITAL_COMMUNITY)
Admission: RE | Admit: 2016-07-29 | Discharge: 2016-07-29 | Disposition: A | Discharge status: Home / Self Care | Payer: Medicare Other | Source: Ambulatory Visit | Attending: Internal Medicine | Admitting: Internal Medicine

## 2016-07-29 ENCOUNTER — Other Ambulatory Visit: Payer: Self-pay | Admitting: Family Medicine

## 2016-07-29 ENCOUNTER — Ambulatory Visit: Payer: Medicare Other

## 2016-07-29 ENCOUNTER — Encounter (HOSPITAL_COMMUNITY): Payer: Self-pay

## 2016-07-29 DIAGNOSIS — B1920 Unspecified viral hepatitis C without hepatic coma: Secondary | ICD-10-CM | POA: Insufficient documentation

## 2016-07-29 DIAGNOSIS — I2510 Atherosclerotic heart disease of native coronary artery without angina pectoris: Secondary | ICD-10-CM | POA: Insufficient documentation

## 2016-07-29 DIAGNOSIS — C3492 Malignant neoplasm of unspecified part of left bronchus or lung: Secondary | ICD-10-CM | POA: Diagnosis not present

## 2016-07-29 DIAGNOSIS — R160 Hepatomegaly, not elsewhere classified: Secondary | ICD-10-CM | POA: Diagnosis not present

## 2016-07-29 DIAGNOSIS — R590 Localized enlarged lymph nodes: Secondary | ICD-10-CM | POA: Insufficient documentation

## 2016-07-29 DIAGNOSIS — K7460 Unspecified cirrhosis of liver: Secondary | ICD-10-CM | POA: Insufficient documentation

## 2016-07-29 DIAGNOSIS — K8020 Calculus of gallbladder without cholecystitis without obstruction: Secondary | ICD-10-CM | POA: Insufficient documentation

## 2016-07-29 DIAGNOSIS — I700 Atherosclerosis of aorta: Secondary | ICD-10-CM | POA: Diagnosis not present

## 2016-07-29 DIAGNOSIS — N400 Enlarged prostate without lower urinary tract symptoms: Secondary | ICD-10-CM | POA: Diagnosis not present

## 2016-07-29 DIAGNOSIS — Z5111 Encounter for antineoplastic chemotherapy: Secondary | ICD-10-CM

## 2016-07-29 DIAGNOSIS — C3490 Malignant neoplasm of unspecified part of unspecified bronchus or lung: Secondary | ICD-10-CM | POA: Diagnosis not present

## 2016-07-29 MED ORDER — IOPAMIDOL (ISOVUE-300) INJECTION 61%
100.0000 mL | Freq: Once | INTRAVENOUS | Status: AC | PRN
  Administered 2016-07-29: 100 mL via INTRAVENOUS

## 2016-07-31 ENCOUNTER — Encounter: Payer: Self-pay | Admitting: *Deleted

## 2016-07-31 ENCOUNTER — Telehealth: Payer: Self-pay | Admitting: Internal Medicine

## 2016-07-31 ENCOUNTER — Other Ambulatory Visit: Payer: Self-pay | Admitting: Internal Medicine

## 2016-07-31 ENCOUNTER — Ambulatory Visit: Payer: Medicare Other

## 2016-07-31 ENCOUNTER — Other Ambulatory Visit (HOSPITAL_BASED_OUTPATIENT_CLINIC_OR_DEPARTMENT_OTHER): Payer: Medicare Other

## 2016-07-31 ENCOUNTER — Encounter: Payer: Self-pay | Admitting: Internal Medicine

## 2016-07-31 ENCOUNTER — Ambulatory Visit (HOSPITAL_BASED_OUTPATIENT_CLINIC_OR_DEPARTMENT_OTHER): Payer: Medicare Other | Admitting: Internal Medicine

## 2016-07-31 VITALS — BP 121/74 | HR 87 | Temp 97.8°F | Resp 19 | Ht 69.0 in | Wt 172.4 lb

## 2016-07-31 DIAGNOSIS — C3411 Malignant neoplasm of upper lobe, right bronchus or lung: Secondary | ICD-10-CM

## 2016-07-31 DIAGNOSIS — I4891 Unspecified atrial fibrillation: Secondary | ICD-10-CM | POA: Diagnosis not present

## 2016-07-31 DIAGNOSIS — C3491 Malignant neoplasm of unspecified part of right bronchus or lung: Secondary | ICD-10-CM

## 2016-07-31 DIAGNOSIS — D61818 Other pancytopenia: Secondary | ICD-10-CM

## 2016-07-31 DIAGNOSIS — C3492 Malignant neoplasm of unspecified part of left bronchus or lung: Secondary | ICD-10-CM

## 2016-07-31 DIAGNOSIS — J90 Pleural effusion, not elsewhere classified: Secondary | ICD-10-CM

## 2016-07-31 DIAGNOSIS — R609 Edema, unspecified: Secondary | ICD-10-CM | POA: Diagnosis not present

## 2016-07-31 DIAGNOSIS — Z95828 Presence of other vascular implants and grafts: Secondary | ICD-10-CM

## 2016-07-31 DIAGNOSIS — I63522 Cerebral infarction due to unspecified occlusion or stenosis of left anterior cerebral artery: Secondary | ICD-10-CM

## 2016-07-31 DIAGNOSIS — K769 Liver disease, unspecified: Secondary | ICD-10-CM

## 2016-07-31 DIAGNOSIS — R530 Neoplastic (malignant) related fatigue: Secondary | ICD-10-CM

## 2016-07-31 DIAGNOSIS — D709 Neutropenia, unspecified: Secondary | ICD-10-CM

## 2016-07-31 LAB — CBC WITH DIFFERENTIAL/PLATELET
BASO%: 1.3 % (ref 0.0–2.0)
BASOS ABS: 0.1 10*3/uL (ref 0.0–0.1)
EOS ABS: 0 10*3/uL (ref 0.0–0.5)
EOS%: 0.5 % (ref 0.0–7.0)
HEMATOCRIT: 37.2 % — AB (ref 38.4–49.9)
HGB: 12.4 g/dL — ABNORMAL LOW (ref 13.0–17.1)
LYMPH%: 12.2 % — AB (ref 14.0–49.0)
MCH: 31.8 pg (ref 27.2–33.4)
MCHC: 33.4 g/dL (ref 32.0–36.0)
MCV: 95.2 fL (ref 79.3–98.0)
MONO#: 0.5 10*3/uL (ref 0.1–0.9)
MONO%: 9.7 % (ref 0.0–14.0)
NEUT#: 4.3 10*3/uL (ref 1.5–6.5)
NEUT%: 76.3 % — AB (ref 39.0–75.0)
Platelets: 179 10*3/uL (ref 140–400)
RBC: 3.9 10*6/uL — AB (ref 4.20–5.82)
RDW: 17.8 % — ABNORMAL HIGH (ref 11.0–14.6)
WBC: 5.6 10*3/uL (ref 4.0–10.3)
lymph#: 0.7 10*3/uL — ABNORMAL LOW (ref 0.9–3.3)

## 2016-07-31 LAB — COMPREHENSIVE METABOLIC PANEL
ALT: 25 U/L (ref 0–55)
AST: 27 U/L (ref 5–34)
Albumin: 3.4 g/dL — ABNORMAL LOW (ref 3.5–5.0)
Alkaline Phosphatase: 99 U/L (ref 40–150)
Anion Gap: 10 mEq/L (ref 3–11)
BUN: 36.9 mg/dL — AB (ref 7.0–26.0)
CALCIUM: 9.3 mg/dL (ref 8.4–10.4)
CHLORIDE: 106 meq/L (ref 98–109)
CO2: 23 meq/L (ref 22–29)
Creatinine: 1.4 mg/dL — ABNORMAL HIGH (ref 0.7–1.3)
EGFR: 50 mL/min/{1.73_m2} — ABNORMAL LOW (ref >90)
Glucose: 221 mg/dl — ABNORMAL HIGH (ref 70–140)
POTASSIUM: 4.8 meq/L (ref 3.5–5.1)
SODIUM: 139 meq/L (ref 136–145)
Total Bilirubin: 0.8 mg/dL (ref 0.20–1.20)
Total Protein: 6.4 g/dL (ref 6.4–8.3)

## 2016-07-31 MED ORDER — SODIUM CHLORIDE 0.9 % IJ SOLN
10.0000 mL | INTRAMUSCULAR | Status: AC | PRN
  Administered 2016-07-31: 10 mL
  Filled 2016-07-31: qty 10

## 2016-07-31 MED ORDER — HEPARIN SOD (PORK) LOCK FLUSH 100 UNIT/ML IV SOLN
500.0000 [IU] | INTRAVENOUS | Status: DC | PRN
  Filled 2016-07-31: qty 5

## 2016-07-31 MED ORDER — HEPARIN SOD (PORK) LOCK FLUSH 100 UNIT/ML IV SOLN
500.0000 [IU] | INTRAVENOUS | Status: AC | PRN
  Administered 2016-07-31: 500 [IU]
  Filled 2016-07-31: qty 5

## 2016-07-31 NOTE — Progress Notes (Signed)
Oncology Nurse Navigator Documentation  Oncology Nurse Navigator Flowsheets 07/31/2016  Navigator Encounter Type Other  Patient Visit Type MedOnc  Treatment Phase Other  Barriers/Navigation Needs Coordination of Care/per Dr. Worthy Flank request, I contacted pathology dept to have tissue obtained on 02/2014 [facility].    Interventions Coordination of Care  Coordination of Care Other  Acuity Level 2  Acuity Level 2 Other  Time Spent with Patient 30

## 2016-07-31 NOTE — Patient Instructions (Signed)

## 2016-07-31 NOTE — Progress Notes (Signed)
[facility] [phone]   Fax:([phone] [place], [place] [phone]  DIAGNOSIS: Stage IIIB/IV non-small cell lung cancer, adenocarcinoma diagnosed in March of 2015.  PRIOR THERAPY:  1) Systemic chemotherapy with carboplatin for AUC of 5 and Alimta 500 mg/M2 every 3 weeks. First dose expected on 04/13/2014. Status post 6 cycles. 2)  Maintenance chemotherapy with single agent Alimta 500 mg/M2 every 3 weeks. First cycle on 09/06/2014. Status post 3 cycles. 3) Second course of systemic chemotherapy with carboplatin for AUC of 5 and Alimta 500 MG/M2 every 3 weeks. First cycle 11/08/2014. Status post 6 cycles. 4)  Immunotherapy with Nivolumab 3 MG/KG every 2 weeks. First dose 05/24/2015. Status post 3 cycles. 5) Systemic chemotherapy with docetaxel 60 MG/M2 and Cyramza 10 MG/KG every 3 weeks status post 6 cycles. Last dose was given 11/21/2015 with stable disease. 6) Systemic chemotherapy with docetaxel 75 MG/M2 every 3 weeks. First cycle 05/08/2016. Status post one cycle. Discontinued secondary to intolerance to the Neulasta injection. 7) Systemic chemotherapy with docetaxel 25 MG/M2 every weekly. First cycle 05/29/2016. Status post 9 cycles, last dose was given 07/24/2016. Discontinued secondary to disease progression.  CURRENT THERAPY: None.  INTERVAL HISTORY: Shawn Espinoza 75 y.o. male returns to the clinic today for follow up visit. The patient is feeling fine today with no specific complaints except for the persistent fatigue. He tolerated the last cycle of his systemic chemotherapy with weekly docetaxel fairly well.Marland Kitchen He continues to have mild shoulder pain. He denied having any significant chest pain, shortness of breath, cough or hemoptysis. He has no nausea or vomiting, no fever or chills. She had repeat CT scan of the chest, abdomen and pelvis performed recently and he is here for  evaluation and discussion of his scan results and treatment options.  MEDICAL HISTORY: Past Medical History:  Diagnosis Date  . Acute infarction of intestine, part and extent unspecified ([place]) 04/25/2016  . 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 Arthritis   . Atrial fibrillation ([place])   . Constipation   . Depression   . Diabetes mellitus type 2 with retinopathy ([place])   . Diverticulosis of colon   . Dysrhythmia    Atrial fib (found 07/2014)  . Fatty liver 02/29/00   abd ultrasound  . Full code status 09/26/2015  . GERD (gastroesophageal reflux disease)    occasional  . HCAP (healthcare-associated pneumonia) 11/27/2015   IV vanc/zosyn then levaquin courses x2   . History of diabetes mellitus, type II    resolved with diet, h/o neuropathy  . History of tobacco use quit 1990s  . Hyperlipidemia   . Hypertension   . Hypertensive retinopathy of both eyes 2015   mild  . Lobar pneumonia ([place]) 10/18/2015  . Lower back pain   . 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 Neuropathy ([place])   . Non-small cell carcinoma of left lung ([place]) 02/2014   stage IIIb/IV on chemo  . Personal history of colonic adenomas 07/06/2013  . Pneumonia   . 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 Shortness of breath   . 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 Systolic murmur 3009   2Decho - normal LV fxn, EF 55%, mild AS, biatrial enlargement    ALLERGIES:  is allergic to candesartan cilexetil; diltiazem  hcl; doxazosin mesylate; nifedipine; pravastatin; red yeast rice; rosuvastatin; sertraline; simvastatin; and hydrocodone.  MEDICATIONS:  Current Outpatient Prescriptions  Medication Sig Dispense Refill  . acetaminophen (TYLENOL) 500 MG tablet Take 500 mg by mouth 2 (two) times daily. Reported on 05/06/2016    . albuterol (PROVENTIL HFA;VENTOLIN HFA) 108 (90 BASE) MCG/ACT inhaler Inhale 2 puffs into the  lungs every 6 (six) hours as needed for wheezing or shortness of breath. 1 Inhaler 3  . apixaban (ELIQUIS) 5 MG TABS tablet Take 1 tablet (5 mg total) by mouth 2 (two) times daily. Restart when platelets >50.    Marland Kitchen dexamethasone (DECADRON) 4 MG tablet 2 tab po bid, the day before, day of and day after chemotherapy every 3 weeks (Patient taking differently: Take 4 mg by mouth 2 (two) times daily. 2 tab twice daily, the day before, day of and day after chemotherapy every 3 weeks) 40 tablet 1  . glipiZIDE (GLUCOTROL) 5 MG tablet Take 1 tablet (5 mg total) by mouth as directed. Take one daily when taking steroid (dexamethasone).    Marland Kitchen glucose blood (ONE TOUCH ULTRA TEST) test strip Use to check sugar once daily and as needed. Dx: E09.9 100 each 3  . hydrocortisone (ANUSOL-HC) 25 MG suppository Place 1 suppository (25 mg total) rectally 2 (two) times daily. 12 suppository 0  . KLOR-CON M10 10 MEQ tablet Take 20 mEq by mouth daily as needed (with torsemide).   10  . lidocaine-prilocaine (EMLA) cream Apply 1 application topically as needed. 30 g 1  . loratadine (CLARITIN) 10 MG tablet Take 10 mg by mouth as directed. Reported on 01/05/2016    . metoprolol tartrate (LOPRESSOR) 50 MG tablet Take 0.5 tablets (25 mg total) by mouth 2 (two) times daily. 30 tablet 6  . ONETOUCH DELICA LANCETS 10U MISC Use to check sugar once daily and as needed. Dx:E09.9 100 each 3  . polyethylene glycol (MIRALAX / GLYCOLAX) packet Take 8.5 g by mouth daily as needed for moderate constipation.     . potassium chloride (K-DUR) 10 MEQ tablet Take two tablets daily as needed with Torsemide. 180 tablet 3  . spironolactone (ALDACTONE) 50 MG tablet TAKE ONE TABLET BY MOUTH ONCE DAILY 30 tablet 6  . torsemide (DEMADEX) 20 MG tablet TAKE ONE TABLET BY MOUTH TWICE DAILY 60 tablet 6  . triamcinolone cream (KENALOG) 0.1 % Apply 1 application topically 2 (two) times daily. Apply to AA. 60 g 0  . Alum & Mag Hydroxide-Simeth (MAGIC MOUTHWASH  W/LIDOCAINE) SOLN Take 5 mLs by mouth 3 (three) times daily as needed for mouth pain. (Patient not taking: Reported on 07/31/2016) 5 mL 0  . benzonatate (TESSALON) 100 MG capsule Take 1 capsule (100 mg total) by mouth 2 (two) times daily as needed for cough. (Patient not taking: Reported on 07/31/2016) 30 capsule 0  . diphenoxylate-atropine (LOMOTIL) 2.5-0.025 MG per tablet Take 2 tablets by mouth 4 (four) times daily as needed for diarrhea or loose stools. (Patient not taking: Reported on 07/31/2016) 30 tablet 0  . guaiFENesin (MUCINEX) 600 MG 12 hr tablet Take 600 mg by mouth 2 (two) times daily as needed for cough or to loosen phlegm.     . methocarbamol (ROBAXIN) 500 MG tablet Take 500 mg by mouth 3 (three) times daily as needed. Muscle pain    . Multiple Vitamins-Minerals (ICAPS) CAPS Take 1 capsule by mouth 2 (two) times daily.     . sucralfate (CARAFATE) 1 g tablet Take 1 tablet (1  g total) by mouth 3 (three) times daily before meals. (Patient not taking: Reported on 07/31/2016) 90 tablet 1  . traMADol (ULTRAM) 50 MG tablet Take 1 tablet (50 mg total) by mouth every 6 (six) hours as needed. (Patient not taking: Reported on 07/31/2016) 30 tablet 3   No current facility-administered medications for this visit.     SURGICAL HISTORY:  Past Surgical History:  Procedure Laterality Date  . CATARACT EXTRACTION Right 03/2015   Dr [name]  . CHEST TUBE INSERTION Left 08/26/2014   Procedure: INSERTION OF LEFT  PLEURAL DRAINAGE CATHETER;  Surgeon: Ivin Poot, MD;  Location: [facility];  Service: Thoracic;  Laterality: Left;  . COLONOSCOPY  2004  . COLONOSCOPY  2014   tubular adenoma x1, mod diverticulosis ([place])  . FLEXIBLE BRONCHOSCOPY  02/2014   WNL  . PORTACATH PLACEMENT Right 03/31/2015   Procedure: INSERTION PORT-A-CATH;  Surgeon: Ivin Poot, MD;  Location: [facility];  Service: Thoracic;  Laterality: Right;  . REMOVAL OF PLEURAL DRAINAGE CATHETER Left 03/31/2015   Procedure: REMOVAL OF PLEURAL DRAINAGE  CATHETER;  Surgeon: Ivin Poot, MD;  Location: [facility];  Service: Thoracic;  Laterality: Left;  Marland Kitchen VIDEO BRONCHOSCOPY Bilateral 03/17/2014   Procedure: VIDEO BRONCHOSCOPY WITH FLUORO;  Surgeon: Tanda Rockers, MD;  Location: [facility];  Service: Cardiopulmonary;  Laterality: Bilateral;    REVIEW OF SYSTEMS:  Constitutional: positive for fatigue Eyes: negative Ears, nose, mouth, throat, and face: negative Respiratory: positive for dyspnea on exertion Cardiovascular: negative Gastrointestinal: negative Genitourinary:negative Integument/breast: negative Hematologic/lymphatic: negative Musculoskeletal:positive for arthralgias Neurological: negative Behavioral/Psych: negative Endocrine: negative Allergic/Immunologic: negative   PHYSICAL EXAMINATION: General appearance: alert, cooperative, fatigued and no distress Head: Normocephalic, without obvious abnormality, atraumatic Neck: no adenopathy, no JVD, supple, symmetrical, trachea midline and thyroid not enlarged, symmetric, no tenderness/mass/nodules Lymph nodes: Cervical, supraclavicular, and axillary nodes normal. Resp: diminished breath sounds LLL and dullness to percussion LLL Back: symmetric, no curvature. ROM normal. No CVA tenderness. Cardio: regular rate and rhythm, S1, S2 normal, no murmur, click, rub or gallop GI: soft, non-tender; bowel sounds normal; no masses,  no organomegaly Extremities: edema 2+ Neurologic: Alert and oriented X 3, normal strength and tone. Normal symmetric reflexes. Normal coordination and gait  ECOG PERFORMANCE STATUS: 1 - Symptomatic but completely ambulatory  Blood pressure 121/74, pulse 87, temperature 97.8 F (36.6 C), temperature source Oral, resp. rate 19, height '5\' 9"'$  (1.753 m), weight 172 lb 6.4 oz (78.2 kg), SpO2 99 %.  LABORATORY DATA: Lab Results  Component Value Date   WBC 5.6 07/31/2016   HGB 12.4 (L) 07/31/2016   HCT 37.2 (L) 07/31/2016   MCV 95.2 07/31/2016   PLT 179  07/31/2016      Chemistry      Component Value Date/Time   NA 140 07/24/2016 1059   K 4.8 07/24/2016 1059   CL 103 02/13/2016 1405   CO2 24 07/24/2016 1059   BUN 26.9 (H) 07/24/2016 1059   CREATININE 1.2 07/24/2016 1059      Component Value Date/Time   CALCIUM 8.9 07/24/2016 1059   ALKPHOS 90 07/24/2016 1059   AST 29 07/24/2016 1059   ALT 26 07/24/2016 1059   BILITOT 0.83 07/24/2016 1059       RADIOGRAPHIC STUDIES: Ct Chest W Contrast  Result Date: 07/29/2016 CLINICAL DATA:  Lung cancer with possible liver metastasis. Ongoing chemotherapy. Bloody drainage from sinuses. Shortness of breath. Left-sided primary, adenocarcinoma. EXAM: CT CHEST, ABDOMEN, AND PELVIS WITH CONTRAST TECHNIQUE: Multidetector CT  imaging of the chest, abdomen and pelvis was performed following the standard protocol during bolus administration of intravenous contrast. CONTRAST:  141m ISOVUE-300 IOPAMIDOL (ISOVUE-300) INJECTION 61% COMPARISON:  04/23/2016. FINDINGS: CT CHEST FINDINGS Cardiovascular: Aortic and branch vessel atherosclerosis. Normal heart size, without pericardial effusion. Lad and probable left main coronary artery atherosclerosis. No central pulmonary embolism, on this non-dedicated study. Mediastinum/Nodes: Developing adenopathy at the low left jugular station and left side of the thoracic inlet. Example necrotic 11 mm node on image 7/series 2. Increased from 8 mm on the prior (when remeasured). A right Port-A-Cath terminates at the superior caval/atrial junction. Enlarging left subpectoral node, including at 9 mm on image 9/series 2. Prevascular index node measures 2.0 cm on image 21/series 2 versus 1.6 cm previously. Right hilar adenopathy at 3.2 x 3.6 cm today versus 2.2 x 3.2 cm previously. Distal esophageal wall thickening, including on image 37/series 2. Likely similar. Lungs/Pleura: Trace left-sided pleural thickening is similar. No pleural fluid. Mild centrilobular and paraseptal emphysema. Left  lower lobe interstitial thickening and presumed evolving scar. lateral left lower lobe subpleural nodular density is similar, including at 7 x 6 mm on image 82/series 4. Consolidation at the high superior segment left lower lobe is not significantly changed. Musculoskeletal: Remote right rib trauma. CT ABDOMEN PELVIS FINDINGS Hepatobiliary: Infiltrative high right hepatic lobe mass is difficult to measure secondary to its ill-defined borders. Measures on the order of 8.2 x 7.3 cm on image 50/ series 2. Compare 6.8 x 5.3 cm previously. Extends further into the more inferior right hepatic lobe, including to the craniocaudal level of the right portal vein on image 59/series 2. Extension versus a satellite lesion in the subcapsular right hepatic lobe measures 3.3 cm on image 63/series 2. Moderate caudate lobe enlargement is chronic. Small gallstones without acute cholecystitis or biliary duct dilatation. Pancreas: Normal, without mass or ductal dilatation. Spleen: Normal in size, without focal abnormality. Adrenals/Urinary Tract: Normal adrenal glands. Mild renal cortical thinning bilaterally. No hydronephrosis. Normal urinary bladder. Stomach/Bowel: Normal stomach, without wall thickening. Scattered colonic diverticula. Normal terminal ileum and appendix. Normal small bowel. Vascular/Lymphatic: Aortic and branch vessel atherosclerosis. Ectasia of the left common iliac artery is similar 1.6 cm. No abdominopelvic adenopathy. Reproductive: Mild prostatomegaly. Other: No significant free fluid. Musculoskeletal: No acute osseous abnormality. IMPRESSION: CT CHEST IMPRESSION 1. Progressive thoracic adenopathy with developing left supraclavicular/low jugular adenopathy. 2. Similar appearance of the lungs, with minimal left lower lobe nodularity and presumed radiation induced interstitial thickening. 3. Distal esophageal wall thickening is similar and likely represents esophagitis. 4.  Coronary artery atherosclerosis. Aortic  atherosclerosis. CT ABDOMEN AND PELVIS IMPRESSION 1. Progression of hepatic mass/masses, likely representing metastatic disease. Suspicion of concurrent cirrhosis, given caudate lobe enlargement (and clinical history of positive hepatitis-C antibiotic test). 2. Cholelithiasis. 3. Prostatomegaly. Electronically Signed   By: KAbigail MiyamotoM.D.   On: 07/29/2016 14:16   Ct Abdomen Pelvis W Contrast  Result Date: 07/29/2016 CLINICAL DATA:  Lung cancer with possible liver metastasis. Ongoing chemotherapy. Bloody drainage from sinuses. Shortness of breath. Left-sided primary, adenocarcinoma. EXAM: CT CHEST, ABDOMEN, AND PELVIS WITH CONTRAST TECHNIQUE: Multidetector CT imaging of the chest, abdomen and pelvis was performed following the standard protocol during bolus administration of intravenous contrast. CONTRAST:  1032mISOVUE-300 IOPAMIDOL (ISOVUE-300) INJECTION 61% COMPARISON:  04/23/2016. FINDINGS: CT CHEST FINDINGS Cardiovascular: Aortic and branch vessel atherosclerosis. Normal heart size, without pericardial effusion. Lad and probable left main coronary artery atherosclerosis. No central pulmonary embolism, on this non-dedicated study. Mediastinum/Nodes: Developing  adenopathy at the low left jugular station and left side of the thoracic inlet. Example necrotic 11 mm node on image 7/series 2. Increased from 8 mm on the prior (when remeasured). A right Port-A-Cath terminates at the superior caval/atrial junction. Enlarging left subpectoral node, including at 9 mm on image 9/series 2. Prevascular index node measures 2.0 cm on image 21/series 2 versus 1.6 cm previously. Right hilar adenopathy at 3.2 x 3.6 cm today versus 2.2 x 3.2 cm previously. Distal esophageal wall thickening, including on image 37/series 2. Likely similar. Lungs/Pleura: Trace left-sided pleural thickening is similar. No pleural fluid. Mild centrilobular and paraseptal emphysema. Left lower lobe interstitial thickening and presumed evolving scar.  lateral left lower lobe subpleural nodular density is similar, including at 7 x 6 mm on image 82/series 4. Consolidation at the high superior segment left lower lobe is not significantly changed. Musculoskeletal: Remote right rib trauma. CT ABDOMEN PELVIS FINDINGS Hepatobiliary: Infiltrative high right hepatic lobe mass is difficult to measure secondary to its ill-defined borders. Measures on the order of 8.2 x 7.3 cm on image 50/ series 2. Compare 6.8 x 5.3 cm previously. Extends further into the more inferior right hepatic lobe, including to the craniocaudal level of the right portal vein on image 59/series 2. Extension versus a satellite lesion in the subcapsular right hepatic lobe measures 3.3 cm on image 63/series 2. Moderate caudate lobe enlargement is chronic. Small gallstones without acute cholecystitis or biliary duct dilatation. Pancreas: Normal, without mass or ductal dilatation. Spleen: Normal in size, without focal abnormality. Adrenals/Urinary Tract: Normal adrenal glands. Mild renal cortical thinning bilaterally. No hydronephrosis. Normal urinary bladder. Stomach/Bowel: Normal stomach, without wall thickening. Scattered colonic diverticula. Normal terminal ileum and appendix. Normal small bowel. Vascular/Lymphatic: Aortic and branch vessel atherosclerosis. Ectasia of the left common iliac artery is similar 1.6 cm. No abdominopelvic adenopathy. Reproductive: Mild prostatomegaly. Other: No significant free fluid. Musculoskeletal: No acute osseous abnormality. IMPRESSION: CT CHEST IMPRESSION 1. Progressive thoracic adenopathy with developing left supraclavicular/low jugular adenopathy. 2. Similar appearance of the lungs, with minimal left lower lobe nodularity and presumed radiation induced interstitial thickening. 3. Distal esophageal wall thickening is similar and likely represents esophagitis. 4.  Coronary artery atherosclerosis. Aortic atherosclerosis. CT ABDOMEN AND PELVIS IMPRESSION 1. Progression  of hepatic mass/masses, likely representing metastatic disease. Suspicion of concurrent cirrhosis, given caudate lobe enlargement (and clinical history of positive hepatitis-C antibiotic test). 2. Cholelithiasis. 3. Prostatomegaly. Electronically Signed   By: Abigail Miyamoto M.D.   On: 07/29/2016 14:16   ASSESSMENT AND PLAN: this is a very pleasant 75 years old white male with:  1) Stage IV non-small cell lung cancer, adenocarcinoma presenting with large right upper lobe lung mass in addition to mediastinal and bilateral hilar lymphadenopathy as well as cervical lymphadenopathy and left pleural effusion. The patient completed a course systemic chemotherapy with carboplatin and Alimta status post 6 cycles. This was followed by 3 cycles of maintenance chemotherapy with single agent Alimta. The patient tolerated his treatment well. He had evidence for disease progression and the patient was started on treatment again with carboplatin and Alimta status post 6 cycles. He was treated with 4 cycles of immunotherapy with Nivolumab discontinued secondary to disease progression. The patient is currently on systemic chemotherapy with docetaxel and Cyramza status post 6 cycles and tolerated the treatment well except for fatigue and feels admission with recurrent pneumonia.  He has been on observation for the last 6 months. Unfortunately the recent CT scan of the chest, abdomen and  pelvis showed evidence for disease progression with increase in the mediastinal lymphadenopathy as well as increase in size of retrocrural lymph node and gastrohepatic lymphadenopathy. He was started on systemic chemotherapy with docetaxel 75 MG/M2 every 3 weeks with Neulasta support. The patient tolerated the first cycle well except for the significant pain and arthralgias with the Neulasta injection and he doesn't think that he can continue with the same regimen including the Neulasta injection. I switched his treatment to docetaxel 25 MG/M2  on weekly basis and he is tolerating it much better. He is status post 9 weekly doses. Unfortunately the recent CT scan of the chest, abdomen and pelvis showed evidence for disease progression in the lung and liver. I discussed the scan results with the patient today. I recommended for him to discontinue his current treatment with docetaxel. I discussed with the patient several options for treatment of his condition including palliative care and hospice referral versus consideration of systemic chemotherapy with single agent gemcitabine or Navelbine. I also recommended for the patient to have ultrasound-guided core biopsy of the enlarging liver lesion for molecular studies to rule out the presence of any targetable mutation. The patient is interested in this option. I will ask interventional radiology for consideration of the core biopsy of the liver. I will see him back for follow-up visit in 3 weeks for evaluation and discussion of his treatment options based on the molecular studies.  2) Atrial fibrillation: continue Eliquis.  3) lower extremity edema: Improved. Continue Demadex.  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 I contacted his primary care physician office for management of this condition since they ordered the MRI of the brain. I recommended for the patient to continue his current treatment to his Eliquis and advise him to start taking aspirin daily until he sees a neurologist.  5) CODE STATUS: Previously discussed with the patient and his wife. He would like initial resuscitation but no prolonged support if in vegetative state.  He was advised to call immediately if he has any concerning symptoms in the interval.  The patient voices understanding of current disease status and treatment options and is in agreement with the current care plan.  All questions were answered. The patient knows to call the  clinic with any problems, questions or concerns. We can certainly see the patient much sooner if necessary.  Disclaimer: This note was dictated with voice recognition software. Similar sounding words can inadvertently be transcribed and may not be corrected upon review.

## 2016-07-31 NOTE — Telephone Encounter (Signed)
Gave pt cal & avs °

## 2016-08-02 ENCOUNTER — Telehealth: Payer: Self-pay

## 2016-08-02 NOTE — Telephone Encounter (Signed)
Shawn Espinoza left v/m; pt is having liver biopsy on 08/08/16; when scheduling appt nurse advised pt might possibly need to stop Eliquis for 2 days prior to appt; pt was to verify with PCP about stopping Eliquis or not. Shawn Espinoza request cb if pt should stop the Eliquis prior to liver biopsy and if so how long should pt stop prior to biopsy.Please advise.

## 2016-08-04 NOTE — Telephone Encounter (Signed)
H/o afib s/p stroke 04/2016.  Recommend stop eliquis 2 days prior to surgery and hold morning of surgery.  Restart when surgeon says ok, likely night of procedure.

## 2016-08-05 NOTE — Telephone Encounter (Signed)
LMTRC

## 2016-08-06 ENCOUNTER — Encounter: Payer: Self-pay | Admitting: Family Medicine

## 2016-08-06 ENCOUNTER — Ambulatory Visit (INDEPENDENT_AMBULATORY_CARE_PROVIDER_SITE_OTHER): Payer: Medicare Other | Admitting: Family Medicine

## 2016-08-06 ENCOUNTER — Telehealth: Payer: Self-pay | Admitting: Family Medicine

## 2016-08-06 VITALS — BP 128/80 | HR 85 | Wt 171.1 lb

## 2016-08-06 DIAGNOSIS — C3492 Malignant neoplasm of unspecified part of left bronchus or lung: Secondary | ICD-10-CM

## 2016-08-06 DIAGNOSIS — K7689 Other specified diseases of liver: Secondary | ICD-10-CM

## 2016-08-06 DIAGNOSIS — K769 Liver disease, unspecified: Secondary | ICD-10-CM

## 2016-08-06 DIAGNOSIS — I480 Paroxysmal atrial fibrillation: Secondary | ICD-10-CM

## 2016-08-06 DIAGNOSIS — E099 Drug or chemical induced diabetes mellitus without complications: Secondary | ICD-10-CM

## 2016-08-06 DIAGNOSIS — I5032 Chronic diastolic (congestive) heart failure: Secondary | ICD-10-CM

## 2016-08-06 DIAGNOSIS — J3489 Other specified disorders of nose and nasal sinuses: Secondary | ICD-10-CM

## 2016-08-06 DIAGNOSIS — Z7901 Long term (current) use of anticoagulants: Secondary | ICD-10-CM

## 2016-08-06 DIAGNOSIS — T380X5A Adverse effect of glucocorticoids and synthetic analogues, initial encounter: Secondary | ICD-10-CM | POA: Diagnosis not present

## 2016-08-06 DIAGNOSIS — I63422 Cerebral infarction due to embolism of left anterior cerebral artery: Secondary | ICD-10-CM | POA: Diagnosis not present

## 2016-08-06 DIAGNOSIS — R609 Edema, unspecified: Secondary | ICD-10-CM

## 2016-08-06 MED ORDER — KLOR-CON M10 10 MEQ PO TBCR: 20.0000 meq | EXTENDED_RELEASE_TABLET | ORAL | 6 refills | Status: AC

## 2016-08-06 MED ORDER — TORSEMIDE 20 MG PO TABS: 20.0000 mg | ORAL_TABLET | ORAL | 6 refills | Status: AC

## 2016-08-06 NOTE — Telephone Encounter (Signed)
Pt daughter dropped off fmla paperwork She stated for right now she only needs to help with taking to dr appointment and some daily activies.  In Dr Darnell Level IN BOX  For review and signature

## 2016-08-06 NOTE — Progress Notes (Signed)
BP 128/80   Pulse 85   Wt 171 lb 1.9 oz (77.6 kg)   SpO2 95%   BMI 25.27 kg/m    CC: 3 mo f/u visit Subjective:    Patient ID: Shawn Espinoza, male    DOB: [birth year], 75 y.o.   MRN: [id number]  HPI: Shawn Espinoza is a 75 y.o. male presenting on 08/06/2016 for Follow-up   Latest onc notes reviewed. CT showing progression of lung cancer in lung and liver, had restarted chemotherapy, last does was 2 wks ago, currently on hold. Pending liver biopsy - will hold eliquis x2 days. H/o neulasta induced myalgias. Stable pedal edema. Sugars creeping up (170-200s), has only used glipizide once. Requests I fill out FMLA.  Lab Results  Component Value Date   HGBA1C 4.9 10/18/2015     Paroxysmal afib s/p cerebral stroke - continues eliquis without significant bleeding/bruising noted.   Main complaint is worsening sinus drainage not controlled with claritin daily for 1 month. Ongoing sinus congestion. Did have episode of epistaxis. Also takes mucinex.   Relevant past medical, surgical, family and social history reviewed and updated as indicated. Interim medical history since our last visit reviewed. Allergies and medications reviewed and updated. Current Outpatient Prescriptions on File Prior to Visit  Medication Sig  . acetaminophen (TYLENOL) 500 MG tablet Take 500 mg by mouth 2 (two) times daily. Reported on 05/06/2016  . albuterol (PROVENTIL HFA;VENTOLIN HFA) 108 (90 BASE) MCG/ACT inhaler Inhale 2 puffs into the lungs every 6 (six) hours as needed for wheezing or shortness of breath.  . Alum & Mag Hydroxide-Simeth (MAGIC MOUTHWASH W/LIDOCAINE) SOLN Take 5 mLs by mouth 3 (three) times daily as needed for mouth pain.  Marland Kitchen apixaban (ELIQUIS) 5 MG TABS tablet Take 1 tablet (5 mg total) by mouth 2 (two) times daily. Restart when platelets >50.  Marland Kitchen dexamethasone (DECADRON) 4 MG tablet 2 tab po bid, the day before, day of and day after chemotherapy every 3 weeks (Patient taking differently:  Take 8 mg by mouth 2 (two) times daily. 2 tab po bid, the day before, day of and day after chemotherapy every 3 weeks)  . diphenoxylate-atropine (LOMOTIL) 2.5-0.025 MG per tablet Take 2 tablets by mouth 4 (four) times daily as needed for diarrhea or loose stools.  Marland Kitchen glipiZIDE (GLUCOTROL) 5 MG tablet Take 1 tablet (5 mg total) by mouth as directed. Take one daily when taking steroid (dexamethasone).  Marland Kitchen glucose blood (ONE TOUCH ULTRA TEST) test strip Use to check sugar once daily and as needed. 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 guaiFENesin (MUCINEX) 600 MG 12 hr tablet Take 600 mg by mouth 2 (two) times daily as needed for cough or to loosen phlegm.   . hydrocortisone (ANUSOL-HC) 25 MG suppository Place 1 suppository (25 mg total) rectally 2 (two) times daily. (Patient taking differently: Place 25 mg rectally 2 (two) times daily as needed for hemorrhoids or itching. )  . lidocaine-prilocaine (EMLA) cream Apply 1 application topically as needed. (Patient taking differently: Apply 1 application topically as needed (for port access). )  . methocarbamol (ROBAXIN) 500 MG tablet Take 500 mg by mouth 3 (three) times daily as needed. Muscle pain  . metoprolol tartrate (LOPRESSOR) 50 MG tablet Take 0.5 tablets (25 mg total) by mouth 2 (two) times daily.  . Multiple Vitamins-Minerals (ICAPS) CAPS Take 1 capsule by mouth 2 (two) times daily.   [name] DELICA LANCETS 43X MISC Use to check sugar once daily and as needed. Dx:E09.9  .  polyethylene glycol (MIRALAX / GLYCOLAX) packet Take 8.5 g by mouth daily as needed for moderate constipation.   . sucralfate (CARAFATE) 1 g tablet Take 1 tablet (1 g total) by mouth 3 (three) times daily before meals.  . traMADol (ULTRAM) 50 MG tablet Take 1 tablet (50 mg total) by mouth every 6 (six) hours as needed. (Patient taking differently: Take 50 mg by mouth every 6 (six) hours as needed for moderate pain. )   No current facility-administered medications on file prior to visit.     Review of  Systems Per HPI unless specifically indicated in ROS section     Objective:    BP 128/80   Pulse 85   Wt 171 lb 1.9 oz (77.6 kg)   SpO2 95%   BMI 25.27 kg/m   Wt Readings from Last 3 Encounters:  08/06/16 171 lb 1.9 oz (77.6 kg)  07/31/16 172 lb 6.4 oz (78.2 kg)  07/10/16 172 lb 3.2 oz (78.1 kg)    Physical Exam  Constitutional: He appears well-developed and well-nourished. No distress.  HENT:  Nose: Mucosal edema and rhinorrhea present.  Mouth/Throat: Oropharynx is clear and moist. No oropharyngeal exudate.  Raw nasal mucosa with congestion  Cardiovascular: Normal rate, regular rhythm, normal heart sounds and intact distal pulses.   No murmur heard. Pulmonary/Chest: Effort normal and breath sounds normal. No respiratory distress. He has no wheezes. He has no rales.  Musculoskeletal: He exhibits no edema.  Woody consistency of BLE  Skin: Skin is warm and dry. No rash noted.  Psychiatric: He has a normal mood and affect.  Nursing note and vitals reviewed.  Lab Results  Component Value Date   HGBA1C 4.9 10/18/2015    Lab Results  Component Value Date   CREATININE 1.4 (H) 07/31/2016    Lab Results  Component Value Date   WBC 5.6 07/31/2016   HGB 12.4 (L) 07/31/2016   HCT 37.2 (L) 07/31/2016   MCV 95.2 07/31/2016   PLT 179 07/31/2016    Lab Results  Component Value Date   HGBA1C 4.9 10/18/2015      Assessment & Plan:   Problem List Items Addressed This Visit    Adenocarcinoma of left lung, stage 4 ([place])    Appreciate onc care. Will fill out FMLA forms for daughter. Pending liver biopsy to further evaluate molecular composition of cancer to help guide treatment.      CHF (congestive heart failure) (H[facility])    Overall euvolemic - will decrease torsemide and potassium to QOD dosing given recent bump in creatinine.       Relevant Medications   spironolactone (ALDACTONE) 50 MG tablet   torsemide (DEMADEX) 20 MG tablet   Liver lesion    Pending liver biopsy,  discussed eliquis hold.      Long term current use of anticoagulant therapy    See above for anticoagulant dosing.      Paroxysmal a-fib ([facility])    Discussed eliquis dosing for upcoming liver biopsy - hold x 2 days prior to procedure, restart night if procedure if interventionalist agrees.      Relevant Medications   spironolactone (ALDACTONE) 50 MG tablet   torsemide (DEMADEX) 20 MG tablet   Peripheral edema    Overall stable. Will decrease torsemide to '20mg'$  QOD dosing.       Rhinorrhea    Exam with raw nasal mucosa. Suggested regular nasal saline.        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 Some elevated readings endorsed. Will try to order fructosamine with next blood draw (ordered in chart, provided patient with written Rx to take with him.      Relevant Orders   Fructosamine    Other Visit Diagnoses   None.      Follow up plan: Return in about 3 months (around 11/06/2016) for follow up visit.  [name], MD

## 2016-08-06 NOTE — Progress Notes (Signed)
Pre visit review using our clinic review tool, if applicable. No additional management support is needed unless otherwise documented below in the visit note. 

## 2016-08-06 NOTE — Patient Instructions (Addendum)
Continue nasal saline 2-3 times a day for ongoing nasal congestion/drainage. Decrease torsemide and potassium tablets to every other day, monitor leg swelling on this lower dose. Return as needed or in 3 months for follow up.  See if next lab visit they will add fructosamine to blood draw.

## 2016-08-07 ENCOUNTER — Other Ambulatory Visit: Payer: Self-pay | Admitting: General Surgery

## 2016-08-07 ENCOUNTER — Other Ambulatory Visit: Payer: Self-pay | Admitting: Radiology

## 2016-08-07 DIAGNOSIS — J3489 Other specified disorders of nose and nasal sinuses: Secondary | ICD-10-CM | POA: Insufficient documentation

## 2016-08-07 NOTE — Assessment & Plan Note (Signed)
See above for anticoagulant dosing.

## 2016-08-07 NOTE — Assessment & Plan Note (Signed)
Exam with raw nasal mucosa. Suggested regular nasal saline.

## 2016-08-07 NOTE — Assessment & Plan Note (Signed)
Overall stable. Will decrease torsemide to '20mg'$  QOD dosing.

## 2016-08-07 NOTE — Assessment & Plan Note (Signed)
Pending liver biopsy, discussed eliquis hold.

## 2016-08-07 NOTE — Assessment & Plan Note (Addendum)
Discussed eliquis dosing for upcoming liver biopsy - hold x 2 days prior to procedure, restart night if procedure if interventionalist agrees.

## 2016-08-07 NOTE — Assessment & Plan Note (Addendum)
Overall euvolemic - will decrease torsemide and potassium to QOD dosing given recent bump in creatinine.

## 2016-08-07 NOTE — Assessment & Plan Note (Signed)
Appreciate onc care. Will fill out FMLA forms for daughter. Pending liver biopsy to further evaluate molecular composition of cancer to help guide treatment.

## 2016-08-07 NOTE — Telephone Encounter (Signed)
Given back to Robin 

## 2016-08-07 NOTE — Assessment & Plan Note (Addendum)
Not regularly using glipizide as he's not regularly getting dexamethasone. Some elevated readings endorsed. Will try to order fructosamine with next blood draw (ordered in chart, provided patient with written Rx to take with him.

## 2016-08-07 NOTE — Telephone Encounter (Addendum)
Filled and in [employer] box. Daughter needs to sign first page

## 2016-08-08 ENCOUNTER — Ambulatory Visit (HOSPITAL_COMMUNITY)
Admission: RE | Admit: 2016-08-08 | Discharge: 2016-08-08 | Disposition: A | Discharge status: Home / Self Care | Payer: Medicare Other | Source: Ambulatory Visit | Attending: Internal Medicine | Admitting: Internal Medicine

## 2016-08-08 ENCOUNTER — Encounter (HOSPITAL_COMMUNITY): Payer: Self-pay

## 2016-08-08 DIAGNOSIS — I10 Essential (primary) hypertension: Secondary | ICD-10-CM | POA: Diagnosis not present

## 2016-08-08 DIAGNOSIS — E11319 Type 2 diabetes mellitus with unspecified diabetic retinopathy without macular edema: Secondary | ICD-10-CM | POA: Diagnosis not present

## 2016-08-08 DIAGNOSIS — F329 Major depressive disorder, single episode, unspecified: Secondary | ICD-10-CM | POA: Insufficient documentation

## 2016-08-08 DIAGNOSIS — Z79899 Other long term (current) drug therapy: Secondary | ICD-10-CM | POA: Insufficient documentation

## 2016-08-08 DIAGNOSIS — C220 Liver cell carcinoma: Secondary | ICD-10-CM | POA: Diagnosis not present

## 2016-08-08 DIAGNOSIS — I4891 Unspecified atrial fibrillation: Secondary | ICD-10-CM | POA: Insufficient documentation

## 2016-08-08 DIAGNOSIS — K219 Gastro-esophageal reflux disease without esophagitis: Secondary | ICD-10-CM | POA: Insufficient documentation

## 2016-08-08 DIAGNOSIS — Z87891 Personal history of nicotine dependence: Secondary | ICD-10-CM | POA: Diagnosis not present

## 2016-08-08 DIAGNOSIS — E785 Hyperlipidemia, unspecified: Secondary | ICD-10-CM | POA: Insufficient documentation

## 2016-08-08 DIAGNOSIS — H3530 Unspecified macular degeneration: Secondary | ICD-10-CM | POA: Diagnosis not present

## 2016-08-08 DIAGNOSIS — Z7901 Long term (current) use of anticoagulants: Secondary | ICD-10-CM | POA: Insufficient documentation

## 2016-08-08 DIAGNOSIS — C3492 Malignant neoplasm of unspecified part of left bronchus or lung: Secondary | ICD-10-CM | POA: Insufficient documentation

## 2016-08-08 DIAGNOSIS — R160 Hepatomegaly, not elsewhere classified: Secondary | ICD-10-CM | POA: Diagnosis not present

## 2016-08-08 LAB — CBC
HEMATOCRIT: 38.9 % — AB (ref 39.0–52.0)
HEMOGLOBIN: 12.5 g/dL — AB (ref 13.0–17.0)
MCH: 31.3 pg (ref 26.0–34.0)
MCHC: 32.1 g/dL (ref 30.0–36.0)
MCV: 97.5 fL (ref 78.0–100.0)
Platelets: 150 10*3/uL (ref 150–400)
RBC: 3.99 MIL/uL — AB (ref 4.22–5.81)
RDW: 16.6 % — ABNORMAL HIGH (ref 11.5–15.5)
WBC: 5.4 10*3/uL (ref 4.0–10.5)

## 2016-08-08 LAB — PROTIME-INR
INR: 1.05
Prothrombin Time: 13.7 seconds (ref 11.4–15.2)

## 2016-08-08 LAB — GLUCOSE, CAPILLARY: GLUCOSE-CAPILLARY: 191 mg/dL — AB (ref 65–99)

## 2016-08-08 LAB — APTT: aPTT: 36 seconds (ref 24–36)

## 2016-08-08 MED ORDER — MIDAZOLAM HCL 2 MG/2ML IJ SOLN
INTRAMUSCULAR | Status: AC
  Filled 2016-08-08: qty 2

## 2016-08-08 MED ORDER — MIDAZOLAM HCL 2 MG/2ML IJ SOLN
INTRAMUSCULAR | Status: AC | PRN
  Administered 2016-08-08: 1 mg via INTRAVENOUS

## 2016-08-08 MED ORDER — GELATIN ABSORBABLE 12-7 MM EX MISC
CUTANEOUS | Status: AC
  Filled 2016-08-08: qty 1

## 2016-08-08 MED ORDER — FENTANYL CITRATE (PF) 100 MCG/2ML IJ SOLN
INTRAMUSCULAR | Status: AC | PRN
  Administered 2016-08-08: 50 ug via INTRAVENOUS

## 2016-08-08 MED ORDER — SODIUM CHLORIDE 0.9 % IV SOLN: INTRAVENOUS | Status: DC

## 2016-08-08 MED ORDER — SODIUM CHLORIDE 0.9% FLUSH
10.0000 mL | INTRAVENOUS | Status: DC | PRN
  Administered 2016-08-08: 10 mL

## 2016-08-08 MED ORDER — SODIUM CHLORIDE 0.9 % IV SOLN
INTRAVENOUS | Status: AC | PRN
  Administered 2016-08-08: 10 mL/h via INTRAVENOUS

## 2016-08-08 MED ORDER — LIDOCAINE HCL 1 % IJ SOLN
INTRAMUSCULAR | Status: AC
  Filled 2016-08-08: qty 20

## 2016-08-08 MED ORDER — FENTANYL CITRATE (PF) 100 MCG/2ML IJ SOLN
INTRAMUSCULAR | Status: AC
  Filled 2016-08-08: qty 2

## 2016-08-08 NOTE — H&P (Signed)
Chief Complaint: Patient was seen in consultation today for liver lesion biopsy at the request of [facility]  Referring Physician(s): Mohamed,Mohamed  Supervising Physician: Markus Daft  Patient Status: Outpatient  History of Present Illness: Shawn Espinoza is a 75 y.o. male   Hx [place] lung Cancer Ongoing chemotherapy - last dose 2 weeks ago - secondary new findings of LAN; hepatic lesions  07/29/2016 CT CHEST IMPRESSION 1. Progressive thoracic adenopathy with developing left supraclavicular/low jugular adenopathy. 2. Similar appearance of the lungs, with minimal left lower lobe nodularity and presumed radiation induced interstitial thickening. 3. Distal esophageal wall thickening is similar and likely represents esophagitis. 4.  Coronary artery atherosclerosis. Aortic atherosclerosis.  CT ABDOMEN AND PELVIS IMPRESSION 1. Progression of hepatic mass/masses, likely representing metastatic disease. Suspicion of concurrent cirrhosis, given caudate lobe enlargement (and clinical history of positive hepatitis-C antibiotic test). 2. Cholelithiasis. 3. Prostatomegaly.  Last dose Eliquis 8/14 pm  Scheduled now for liver lesion biopsy with Foundation 1 and molecular studies for treatment plan  Past Medical History:  Diagnosis Date  . Acute infarction of intestine, part and extent unspecified ([place]) 04/25/2016  . 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 Arthritis   . Atrial fibrillation ([place])   . Constipation   . Depression   . Diabetes mellitus type 2 with retinopathy ([place])   . Diverticulosis of colon   . Dysrhythmia    Atrial fib (found 07/2014)  . Fatty liver 02/29/00   abd ultrasound  . Full code status 09/26/2015  . GERD (gastroesophageal reflux disease)    occasional  . HCAP (healthcare-associated pneumonia) 11/27/2015   IV vanc/zosyn then levaquin courses x2   . History of diabetes mellitus, type II    resolved with  diet, h/o neuropathy  . History of tobacco use quit 1990s  . Hyperlipidemia   . Hypertension   . Hypertensive retinopathy of both eyes 2015   mild  . Lobar pneumonia ([place]) 10/18/2015  . Lower back pain   . 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 Neuropathy ([place])   . 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 Personal history of colonic adenomas 07/06/2013  . Pneumonia   . 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 Shortness of breath   . 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 Systolic murmur 9562   2Decho - normal LV fxn, EF 55%, mild AS, biatrial enlargement    Past Surgical History:  Procedure Laterality Date  . CATARACT EXTRACTION Right 03/2015   Dr [name]  . CHEST TUBE INSERTION Left 08/26/2014   Procedure: INSERTION OF LEFT  PLEURAL DRAINAGE CATHETER;  Surgeon: Ivin Poot, MD;  Location: [facility];  Service: Thoracic;  Laterality: Left;  . COLONOSCOPY  2004  . COLONOSCOPY  2014   tubular adenoma x1, mod diverticulosis ([place])  . FLEXIBLE BRONCHOSCOPY  02/2014   WNL  . PORTACATH PLACEMENT Right 03/31/2015   Procedure: INSERTION PORT-A-CATH;  Surgeon: Ivin Poot, MD;  Location: [place];  Service: Thoracic;  Laterality: Right;  . REMOVAL OF PLEURAL DRAINAGE CATHETER Left 03/31/2015   Procedure: REMOVAL OF PLEURAL DRAINAGE CATHETER;  Surgeon: Ivin Poot, MD;  Location: [facility];  Service: Thoracic;  Laterality: Left;  Marland Kitchen VIDEO BRONCHOSCOPY Bilateral 03/17/2014   Procedure: VIDEO BRONCHOSCOPY WITH FLUORO;  Surgeon: Tanda Rockers, MD;  Location:  [facility];  Service: Cardiopulmonary;  Laterality: Bilateral;    Allergies: Candesartan cilexetil; Diltiazem hcl; Doxazosin mesylate; Nifedipine; Pravastatin; Red yeast rice; Rosuvastatin; Sertraline; Simvastatin; and Hydrocodone  Medications: Prior to Admission medications   Medication Sig Start Date End Date Taking?  Authorizing Provider  albuterol (PROVENTIL HFA;VENTOLIN HFA) 108 (90 BASE) MCG/ACT inhaler Inhale 2 puffs into the lungs every 6 (six) hours as needed for wheezing or shortness of breath. 09/08/15  Yes Carlton Adam, PA-C  apixaban (ELIQUIS) 5 MG TABS tablet Take 1 tablet (5 mg total) by mouth 2 (two) times daily. Restart when platelets >50. 12/04/15  Yes Verlee Monte, MD  dexamethasone (DECADRON) 4 MG tablet 2 tab po bid, the day before, day of and day after chemotherapy every 3 weeks Patient taking differently: Take 8 mg by mouth 2 (two) times daily. 2 tab po bid, the day before, day of and day after chemotherapy every 3 weeks 07/11/15  Yes Curt Bears, MD  glipiZIDE (GLUCOTROL) 5 MG tablet Take 1 tablet (5 mg total) by mouth as directed. Take one daily when taking steroid (dexamethasone). 12/30/15  Yes [name], MD  glucose blood (ONE TOUCH ULTRA TEST) test strip Use to check sugar once daily and as needed. Dx: E09.9 05/17/16  Yes [name], MD  KLOR-CON M10 10 MEQ tablet Take 2 tablets (20 mEq total) by mouth every other day. 08/06/16  Yes [name], MD  lidocaine-prilocaine (EMLA) cream Apply 1 application topically as needed. Patient taking differently: Apply 1 application topically as needed (for port access).  05/08/16  Yes Curt Bears, MD  methocarbamol (ROBAXIN) 500 MG tablet Take 500 mg by mouth 3 (three) times daily as needed. Muscle pain 05/06/16  Yes [name], MD  metoprolol tartrate (LOPRESSOR) 50 MG tablet Take 0.5 tablets (25 mg total) by mouth 2 (two) times daily. 05/17/16  Yes [name], MD  Multiple Vitamins-Minerals (ICAPS) CAPS Take 1 capsule by mouth 2 (two) times daily.    Yes Historical Provider, MD  [facility] DELICA LANCETS 09F MISC Use to check sugar once daily and as needed. Dx:E09.9 05/17/16  Yes [name], MD  polyethylene glycol [facility] / GLYCOLAX) packet Take 8.5 g by mouth daily as needed for moderate constipation.    Yes  Historical Provider, MD  spironolactone (ALDACTONE) 50 MG tablet Take 1 tablet (50 mg total) by mouth daily. 08/06/16  Yes [name], MD  torsemide (DEMADEX) 20 MG tablet Take 1 tablet (20 mg total) by mouth every other day. 08/06/16  Yes [name], MD  traMADol (ULTRAM) 50 MG tablet Take 1 tablet (50 mg total) by mouth every 6 (six) hours as needed. Patient taking differently: Take 50 mg by mouth every 6 (six) hours as needed for moderate pain.  05/08/16  Yes Curt Bears, MD  acetaminophen (TYLENOL) 500 MG tablet Take 500 mg by mouth 2 (two) times daily. Reported on 05/06/2016    Historical Provider, MD  Alum & Mag Hydroxide-Simeth (MAGIC MOUTHWASH W/LIDOCAINE) SOLN Take 5 mLs by mouth 3 (three) times daily as needed for mouth pain. 03/03/15   Curt Bears, MD  diphenoxylate-atropine (LOMOTIL) 2.5-0.025 MG per tablet Take 2 tablets by mouth 4 (four) times daily as needed for diarrhea or loose stools. 01/17/15   Susanne Borders, NP  guaiFENesin (MUCINEX) 600 MG 12 hr tablet Take 600 mg by mouth 2 (two) times daily as needed for cough or to loosen phlegm.     Historical Provider, MD  hydrocortisone (ANUSOL-HC) 25 MG  suppository Place 1 suppository (25 mg total) rectally 2 (two) times daily. Patient taking differently: Place 25 mg rectally 2 (two) times daily as needed for hemorrhoids or itching.  07/26/15   Jearld Fenton, NP  sucralfate (CARAFATE) 1 g tablet Take 1 tablet (1 g total) by mouth 3 (three) times daily before meals. 02/13/16   [name], MD     Family History  Problem Relation Age of Onset  . Stroke Mother     multiple mini strokes  . Hypertension Mother   . Prostate cancer Father   . Heart disease Maternal Grandfather     MI  . Stroke Paternal Grandmother     Social History   Social History  . Marital status: Married    Spouse name: N/A  . Number of children: 2  . Years of education: Bachelors   Occupational History  . [employer], picks up golf balls.   [employer]    Social History Main Topics  . Smoking status: Former Smoker    Packs/day: 3.00    Years: 30.00    Types: Cigarettes    Quit date: 12/23/1988  . Smokeless tobacco: Never Used  . Alcohol use 12.6 oz/week    21 Glasses of wine per week     Comment: 3-4 wine a day ( 8/03- 2 beers, 2 wines, 2 brandies daily)  . Drug use: No  . Sexual activity: No   Other Topics Concern  . None   Social History Narrative   Lives with wife.   [employer], failed eye exam, quit   Activity: active at gym regularly, golfing   Diet: healthy - oatmeal, avoids white starches   Right-handed.   1-2 cups caffeine per day.      Advanced directives: Full code. Wouldn't want prolonged artificial life support. Wife would be HCproxy     Review of Systems: A 12 point ROS discussed and pertinent positives are indicated in the HPI above.  All other systems are negative.  Review of Systems  Constitutional: Negative for activity change, appetite change, fatigue and unexpected weight change.  Respiratory: Negative for shortness of breath.   Cardiovascular: Negative for chest pain.  Gastrointestinal: Positive for abdominal pain.  Musculoskeletal: Negative for back pain.  Neurological: Positive for weakness.  Psychiatric/Behavioral: Negative for behavioral problems and confusion.    Vital Signs: BP 134/81   Pulse 73   Temp 97.7 F (36.5 C) (Oral)   Resp 16   Ht '5\' 9"'$  (1.753 m)   Wt 165 lb (74.8 kg)   SpO2 99%   BMI 24.37 kg/m   Physical Exam  Constitutional: He is oriented to person, place, and time. He appears well-nourished.  Cardiovascular: Normal rate, regular rhythm and normal heart sounds.   Pulmonary/Chest: Effort normal and breath sounds normal. He has no wheezes.  Abdominal: Soft. Bowel sounds are normal. There is no tenderness.  Musculoskeletal: Normal range of motion.  Neurological: He is alert and oriented to person, place, and time.  Skin: Skin is warm and dry.  Psychiatric: He has a  normal mood and affect. His behavior is normal. Judgment and thought content normal.  Nursing note and vitals reviewed.   Mallampati Score:  MD Evaluation Airway: WNL Heart: WNL Abdomen: WNL Chest/ Lungs: WNL ASA  Classification: 3 Mallampati/Airway Score: One  Imaging: Ct Chest W Contrast  Result Date: 07/29/2016 CLINICAL DATA:  Lung cancer with possible liver metastasis. Ongoing chemotherapy. Bloody drainage from sinuses. Shortness of breath. Left-sided primary, adenocarcinoma. EXAM:  CT CHEST, ABDOMEN, AND PELVIS WITH CONTRAST TECHNIQUE: Multidetector CT imaging of the chest, abdomen and pelvis was performed following the standard protocol during bolus administration of intravenous contrast. CONTRAST:  161m ISOVUE-300 IOPAMIDOL (ISOVUE-300) INJECTION 61% COMPARISON:  04/23/2016. FINDINGS: CT CHEST FINDINGS Cardiovascular: Aortic and branch vessel atherosclerosis. Normal heart size, without pericardial effusion. Lad and probable left main coronary artery atherosclerosis. No central pulmonary embolism, on this non-dedicated study. Mediastinum/Nodes: Developing adenopathy at the low left jugular station and left side of the thoracic inlet. Example necrotic 11 mm node on image 7/series 2. Increased from 8 mm on the prior (when remeasured). A right Port-A-Cath terminates at the superior caval/atrial junction. Enlarging left subpectoral node, including at 9 mm on image 9/series 2. Prevascular index node measures 2.0 cm on image 21/series 2 versus 1.6 cm previously. Right hilar adenopathy at 3.2 x 3.6 cm today versus 2.2 x 3.2 cm previously. Distal esophageal wall thickening, including on image 37/series 2. Likely similar. Lungs/Pleura: Trace left-sided pleural thickening is similar. No pleural fluid. Mild centrilobular and paraseptal emphysema. Left lower lobe interstitial thickening and presumed evolving scar. lateral left lower lobe subpleural nodular density is similar, including at 7 x 6 mm on image  82/series 4. Consolidation at the high superior segment left lower lobe is not significantly changed. Musculoskeletal: Remote right rib trauma. CT ABDOMEN PELVIS FINDINGS Hepatobiliary: Infiltrative high right hepatic lobe mass is difficult to measure secondary to its ill-defined borders. Measures on the order of 8.2 x 7.3 cm on image 50/ series 2. Compare 6.8 x 5.3 cm previously. Extends further into the more inferior right hepatic lobe, including to the craniocaudal level of the right portal vein on image 59/series 2. Extension versus a satellite lesion in the subcapsular right hepatic lobe measures 3.3 cm on image 63/series 2. Moderate caudate lobe enlargement is chronic. Small gallstones without acute cholecystitis or biliary duct dilatation. Pancreas: Normal, without mass or ductal dilatation. Spleen: Normal in size, without focal abnormality. Adrenals/Urinary Tract: Normal adrenal glands. Mild renal cortical thinning bilaterally. No hydronephrosis. Normal urinary bladder. Stomach/Bowel: Normal stomach, without wall thickening. Scattered colonic diverticula. Normal terminal ileum and appendix. Normal small bowel. Vascular/Lymphatic: Aortic and branch vessel atherosclerosis. Ectasia of the left common iliac artery is similar 1.6 cm. No abdominopelvic adenopathy. Reproductive: Mild prostatomegaly. Other: No significant free fluid. Musculoskeletal: No acute osseous abnormality. IMPRESSION: CT CHEST IMPRESSION 1. Progressive thoracic adenopathy with developing left supraclavicular/low jugular adenopathy. 2. Similar appearance of the lungs, with minimal left lower lobe nodularity and presumed radiation induced interstitial thickening. 3. Distal esophageal wall thickening is similar and likely represents esophagitis. 4.  Coronary artery atherosclerosis. Aortic atherosclerosis. CT ABDOMEN AND PELVIS IMPRESSION 1. Progression of hepatic mass/masses, likely representing metastatic disease. Suspicion of concurrent  cirrhosis, given caudate lobe enlargement (and clinical history of positive hepatitis-C antibiotic test). 2. Cholelithiasis. 3. Prostatomegaly. Electronically Signed   By: KAbigail MiyamotoM.D.   On: 07/29/2016 14:16   Ct Abdomen Pelvis W Contrast  Result Date: 07/29/2016 CLINICAL DATA:  Lung cancer with possible liver metastasis. Ongoing chemotherapy. Bloody drainage from sinuses. Shortness of breath. Left-sided primary, adenocarcinoma. EXAM: CT CHEST, ABDOMEN, AND PELVIS WITH CONTRAST TECHNIQUE: Multidetector CT imaging of the chest, abdomen and pelvis was performed following the standard protocol during bolus administration of intravenous contrast. CONTRAST:  1046mISOVUE-300 IOPAMIDOL (ISOVUE-300) INJECTION 61% COMPARISON:  04/23/2016. FINDINGS: CT CHEST FINDINGS Cardiovascular: Aortic and branch vessel atherosclerosis. Normal heart size, without pericardial effusion. Lad and probable left main coronary artery atherosclerosis.  No central pulmonary embolism, on this non-dedicated study. Mediastinum/Nodes: Developing adenopathy at the low left jugular station and left side of the thoracic inlet. Example necrotic 11 mm node on image 7/series 2. Increased from 8 mm on the prior (when remeasured). A right Port-A-Cath terminates at the superior caval/atrial junction. Enlarging left subpectoral node, including at 9 mm on image 9/series 2. Prevascular index node measures 2.0 cm on image 21/series 2 versus 1.6 cm previously. Right hilar adenopathy at 3.2 x 3.6 cm today versus 2.2 x 3.2 cm previously. Distal esophageal wall thickening, including on image 37/series 2. Likely similar. Lungs/Pleura: Trace left-sided pleural thickening is similar. No pleural fluid. Mild centrilobular and paraseptal emphysema. Left lower lobe interstitial thickening and presumed evolving scar. lateral left lower lobe subpleural nodular density is similar, including at 7 x 6 mm on image 82/series 4. Consolidation at the high superior segment  left lower lobe is not significantly changed. Musculoskeletal: Remote right rib trauma. CT ABDOMEN PELVIS FINDINGS Hepatobiliary: Infiltrative high right hepatic lobe mass is difficult to measure secondary to its ill-defined borders. Measures on the order of 8.2 x 7.3 cm on image 50/ series 2. Compare 6.8 x 5.3 cm previously. Extends further into the more inferior right hepatic lobe, including to the craniocaudal level of the right portal vein on image 59/series 2. Extension versus a satellite lesion in the subcapsular right hepatic lobe measures 3.3 cm on image 63/series 2. Moderate caudate lobe enlargement is chronic. Small gallstones without acute cholecystitis or biliary duct dilatation. Pancreas: Normal, without mass or ductal dilatation. Spleen: Normal in size, without focal abnormality. Adrenals/Urinary Tract: Normal adrenal glands. Mild renal cortical thinning bilaterally. No hydronephrosis. Normal urinary bladder. Stomach/Bowel: Normal stomach, without wall thickening. Scattered colonic diverticula. Normal terminal ileum and appendix. Normal small bowel. Vascular/Lymphatic: Aortic and branch vessel atherosclerosis. Ectasia of the left common iliac artery is similar 1.6 cm. No abdominopelvic adenopathy. Reproductive: Mild prostatomegaly. Other: No significant free fluid. Musculoskeletal: No acute osseous abnormality. IMPRESSION: CT CHEST IMPRESSION 1. Progressive thoracic adenopathy with developing left supraclavicular/low jugular adenopathy. 2. Similar appearance of the lungs, with minimal left lower lobe nodularity and presumed radiation induced interstitial thickening. 3. Distal esophageal wall thickening is similar and likely represents esophagitis. 4.  Coronary artery atherosclerosis. Aortic atherosclerosis. CT ABDOMEN AND PELVIS IMPRESSION 1. Progression of hepatic mass/masses, likely representing metastatic disease. Suspicion of concurrent cirrhosis, given caudate lobe enlargement (and clinical  history of positive hepatitis-C antibiotic test). 2. Cholelithiasis. 3. Prostatomegaly. Electronically Signed   By: Abigail Miyamoto M.D.   On: 07/29/2016 14:16    Labs:  CBC:  Recent Labs  07/17/16 1141 07/24/16 1059 07/31/16 1107 08/08/16 0625  WBC 4.5 4.5 5.6 5.4  HGB 11.2* 11.7* 12.4* 12.5*  HCT 33.5* 35.0* 37.2* 38.9*  PLT 141 154 179 150    COAGS:  Recent Labs  10/18/15 0352 08/08/16 0625  INR 1.92* 1.05  APTT 51* 36    BMP:  Recent Labs  12/01/15 0403 12/02/15 0620 12/03/15 0500 12/04/15 0200  12/28/15 1106 02/13/16 1405  07/10/16 1016 07/17/16 1141 07/24/16 1059 07/31/16 1107  NA 136 140 138 139  < > 141 139  < > 138 140 140 139  K 4.3 4.1 3.9 3.8  < > 4.9 5.1  < > 4.8 5.0 4.8 4.8  CL 105 108 107 108  --  99 103  --   --   --   --   --   CO2 '25 26 26 27  '$ < >  37* 29  < > '25 24 24 23  '$ GLUCOSE 116* 131* 105* 131*  < > 154* 128*  < > 210* 197* 234* 221*  BUN 39* 25* 16 14  < > 14 27*  < > 27.5* 30.8* 26.9* 36.9*  CALCIUM 7.7* 7.5* 7.6* 7.4*  < > 8.5 9.2  < > 8.6 8.9 8.9 9.3  CREATININE 1.37* 1.08 0.81 0.82  < > 0.95 1.11  < > 1.2 1.2 1.2 1.4*  GFRNONAA 49* >60 >60 >60  --   --   --   --   --   --   --   --   GFRAA 57* >60 >60 >60  --   --   --   --   --   --   --   --   < > = values in this interval not displayed.  LIVER FUNCTION TESTS:  Recent Labs  07/10/16 1016 07/17/16 1141 07/24/16 1059 07/31/16 1107  BILITOT 0.76 0.65 0.83 0.80  AST '25 24 29 27  '$ ALT '25 26 26 25  '$ ALKPHOS 89 95 90 99  PROT 6.1* 6.0* 6.0* 6.4  ALBUMIN 3.2* 3.2* 3.2* 3.4*    TUMOR MARKERS: No results for input(s): AFPTM, CEA, CA199, CHROMGRNA in the last 8760 hours.  Assessment and Plan:  Hx Non small cell lung cancer New findings of LAN and hepatic lesions Now for liver lesion biopsy Risks and Benefits discussed with the patient including, but not limited to bleeding, infection, damage to adjacent structures or low yield requiring additional tests. All of the  patient's questions were answered, patient is agreeable to proceed. Consent signed and in chart.   Thank you for this interesting consult.  I greatly enjoyed meeting Shawn Espinoza and look forward to participating in their care.  A copy of this report was sent to the requesting provider on this date.  Electronically Signed: Lavonia Drafts 08/08/2016, 7:22 AM   I spent a total of  30 Minutes   in face to face in clinical consultation, greater than 50% of which was counseling/coordinating care for liver lesion biopsy

## 2016-08-08 NOTE — Sedation Documentation (Signed)
Patient is resting comfortably. 

## 2016-08-08 NOTE — Sedation Documentation (Signed)
Patient denies pain and is resting comfortably.  

## 2016-08-08 NOTE — Procedures (Signed)
Interventional Radiology Procedure Note  Procedure: US guided liver lesion biopsy.  Complications: None Recommendations:  - 2 hour obs. - advance diet - Ok to shower tomorrow - Do not submerge for 7 days - Routine wound care   Signed,  Dulcy Fanny. Earleen [place], DO

## 2016-08-08 NOTE — Telephone Encounter (Signed)
Paperwork faxed Copy for pt Copy for file Copy for scan

## 2016-08-08 NOTE — Discharge Instructions (Signed)
Liver Biopsy, Care After °Refer to this sheet in the next few weeks. These instructions provide you with information on caring for yourself after your procedure. Your health care provider may also give you more specific instructions. Your treatment has been planned according to current medical practices, but problems sometimes occur. Call your health care provider if you have any problems or questions after your procedure. °WHAT TO EXPECT AFTER THE PROCEDURE °After your procedure, it is typical to have the following: °· A small amount of discomfort in the area where the biopsy was done and in the right shoulder or shoulder blade. °· A small amount of bruising around the area where the biopsy was done and on the skin over the liver. °· Sleepiness and fatigue for the rest of the day. °HOME CARE INSTRUCTIONS  °· Rest at home for 1-2 days or as directed by your health care provider. °· Have a friend or family member stay with you for at least 24 hours. °· Because of the medicines used during the procedure, you should not do the following things in the first 24 hours: °¨ Drive. °¨ Use machinery. °¨ Be responsible for the care of other people. °¨ Sign legal documents. °¨ Take a bath or shower. °· There are many different ways to close and cover an incision, including stitches, skin glue, and adhesive strips. Follow your health care provider's instructions on: °¨ Incision care. °¨ Bandage (dressing) changes and removal. °¨ Incision closure removal. °· Do not drink alcohol in the first week. °· Do not lift more than 5 pounds or play contact sports for 2 weeks after this test. °· Take medicines only as directed by your health care provider. Do not take medicine containing aspirin or non-steroidal anti-inflammatory medicines such as ibuprofen for 1 week after this test. °· It is your responsibility to get your test results. °SEEK MEDICAL CARE IF:  °· You have increased bleeding from an incision that results in more than a  small spot of blood. °· You have redness, swelling, or increasing pain in any incisions. °· You notice a discharge or a bad smell coming from any of your incisions. °· You have a fever or chills. °SEEK IMMEDIATE MEDICAL CARE IF:  °· You develop swelling, bloating, or pain in your abdomen. °· You become dizzy or faint. °· You develop a rash. °· You are nauseous or vomit. °· You have difficulty breathing, feel short of breath, or feel faint. °· You develop chest pain. °· You have problems with your speech or vision. °· You have trouble balancing or moving your arms or legs. °  °This information is not intended to replace advice given to you by your health care provider. Make sure you discuss any questions you have with your health care provider. °  °Document Released: 06/28/2005 Document Revised: 12/30/2014 Document Reviewed: 02/04/2014 °Elsevier Interactive Patient Education ©2016 [employer] ° °

## 2016-08-08 NOTE — Telephone Encounter (Signed)
Left message asking pt to call office Please let pt know paperwork has been faxed and there is a copy here for her

## 2016-08-20 ENCOUNTER — Encounter: Payer: Self-pay | Admitting: Podiatry

## 2016-08-20 ENCOUNTER — Ambulatory Visit (INDEPENDENT_AMBULATORY_CARE_PROVIDER_SITE_OTHER): Payer: Medicare Other | Admitting: Podiatry

## 2016-08-20 DIAGNOSIS — M79676 Pain in unspecified toe(s): Secondary | ICD-10-CM | POA: Diagnosis not present

## 2016-08-20 DIAGNOSIS — B351 Tinea unguium: Secondary | ICD-10-CM | POA: Diagnosis not present

## 2016-08-20 NOTE — Patient Instructions (Signed)
Diabetes and Foot Care Diabetes may cause you to have problems because of poor blood supply (circulation) to your feet and legs. This may cause the skin on your feet to become thinner, break easier, and heal more slowly. Your skin may become dry, and the skin may peel and crack. You may also have nerve damage in your legs and feet causing decreased feeling in them. You may not notice minor injuries to your feet that could lead to infections or more serious problems. Taking care of your feet is one of the most important things you can do for yourself.  HOME CARE INSTRUCTIONS  Wear shoes at all times, even in the house. Do not go barefoot. Bare feet are easily injured.  Check your feet daily for blisters, cuts, and redness. If you cannot see the bottom of your feet, use a mirror or ask someone for help.  Wash your feet with warm water (do not use hot water) and mild soap. Then pat your feet and the areas between your toes until they are completely dry. Do not soak your feet as this can dry your skin.  Apply a moisturizing lotion or petroleum jelly (that does not contain alcohol and is unscented) to the skin on your feet and to dry, brittle toenails. Do not apply lotion between your toes.  Trim your toenails straight across. Do not dig under them or around the cuticle. File the edges of your nails with an emery board or nail file.  Do not cut corns or calluses or try to remove them with medicine.  Wear clean socks or stockings every day. Make sure they are not too tight. Do not wear knee-high stockings since they may decrease blood flow to your legs.  Wear shoes that fit properly and have enough cushioning. To break in new shoes, wear them for just a few hours a day. This prevents you from injuring your feet. Always look in your shoes before you put them on to be sure there are no objects inside.  Do not cross your legs. This may decrease the blood flow to your feet.  If you find a minor scrape,  cut, or break in the skin on your feet, keep it and the skin around it clean and dry. These areas may be cleansed with mild soap and water. Do not cleanse the area with peroxide, alcohol, or iodine.  When you remove an adhesive bandage, be sure not to damage the skin around it.  If you have a wound, look at it several times a day to make sure it is healing.  Do not use heating pads or hot water bottles. They may burn your skin. If you have lost feeling in your feet or legs, you may not know it is happening until it is too late.  Make sure your health care provider performs a complete foot exam at least annually or more often if you have foot problems. Report any cuts, sores, or bruises to your health care provider immediately. SEEK MEDICAL CARE IF:   You have an injury that is not healing.  You have cuts or breaks in the skin.  You have an ingrown nail.  You notice redness on your legs or feet.  You feel burning or tingling in your legs or feet.  You have pain or cramps in your legs and feet.  Your legs or feet are numb.  Your feet always feel cold. SEEK IMMEDIATE MEDICAL CARE IF:   There is increasing redness,   swelling, or pain in or around a wound.  There is a red line that goes up your leg.  Pus is coming from a wound.  You develop a fever or as directed by your health care provider.  You notice a bad smell coming from an ulcer or wound.   This information is not intended to replace advice given to you by your health care provider. Make sure you discuss any questions you have with your health care provider.   Document Released: 12/06/2000 Document Revised: 08/11/2013 Document Reviewed: 05/18/2013 Elsevier Interactive Patient Education 2016 Elsevier Inc.  

## 2016-08-20 NOTE — Progress Notes (Signed)
Patient ID: Shawn Espinoza, male   DOB: 01/31/1941, 75 y.o.   MRN: [id number]    Subjective: This patient presents again for scheduled visit complaining that his toenails are elongated and thickened INR comfortable walking wearing shoes and requesting nail debridement Also, today patient is requesting a replacement diabetic shoes  Objective: Orientated 3 Vascular: DP and PT pulses 1/4 bilaterally Capillary reflex immediate bilaterally  Neurological: Sensation to 10 g monofilament wire intact 4/5 right and 1/5 left Vibratory sensation nonreactive bilaterally Ankle reflex reactive bilaterally  Dermatological: Atrophic skin without hair growth bilaterally The toenails are elongated, brittle, friable, incurvated and tender to direct palpation 6-10 No Open skin lesions bilaterally  Musculoskeletal: Hammertoe fifth bilaterally Patient walks with a roller walker  Assessment: 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

## 2016-08-21 ENCOUNTER — Encounter: Payer: Self-pay | Admitting: Internal Medicine

## 2016-08-21 ENCOUNTER — Ambulatory Visit: Payer: Medicare Other

## 2016-08-21 ENCOUNTER — Encounter (HOSPITAL_COMMUNITY): Payer: Self-pay

## 2016-08-21 ENCOUNTER — Other Ambulatory Visit (HOSPITAL_BASED_OUTPATIENT_CLINIC_OR_DEPARTMENT_OTHER): Payer: Medicare Other

## 2016-08-21 ENCOUNTER — Ambulatory Visit (HOSPITAL_BASED_OUTPATIENT_CLINIC_OR_DEPARTMENT_OTHER): Payer: Medicare Other | Admitting: Internal Medicine

## 2016-08-21 VITALS — BP 110/67 | HR 72 | Temp 97.9°F | Resp 18 | Ht 69.0 in | Wt 171.4 lb

## 2016-08-21 DIAGNOSIS — E099 Drug or chemical induced diabetes mellitus without complications: Secondary | ICD-10-CM

## 2016-08-21 DIAGNOSIS — C221 Intrahepatic bile duct carcinoma: Secondary | ICD-10-CM

## 2016-08-21 DIAGNOSIS — Z5112 Encounter for antineoplastic immunotherapy: Secondary | ICD-10-CM | POA: Insufficient documentation

## 2016-08-21 DIAGNOSIS — T380X5A Adverse effect of glucocorticoids and synthetic analogues, initial encounter: Secondary | ICD-10-CM

## 2016-08-21 DIAGNOSIS — J90 Pleural effusion, not elsewhere classified: Secondary | ICD-10-CM | POA: Diagnosis not present

## 2016-08-21 DIAGNOSIS — C220 Liver cell carcinoma: Secondary | ICD-10-CM

## 2016-08-21 DIAGNOSIS — C3411 Malignant neoplasm of upper lobe, right bronchus or lung: Secondary | ICD-10-CM | POA: Diagnosis not present

## 2016-08-21 DIAGNOSIS — R530 Neoplastic (malignant) related fatigue: Secondary | ICD-10-CM

## 2016-08-21 DIAGNOSIS — R5383 Other fatigue: Secondary | ICD-10-CM | POA: Diagnosis not present

## 2016-08-21 DIAGNOSIS — R609 Edema, unspecified: Secondary | ICD-10-CM | POA: Diagnosis not present

## 2016-08-21 DIAGNOSIS — I4891 Unspecified atrial fibrillation: Secondary | ICD-10-CM | POA: Diagnosis not present

## 2016-08-21 DIAGNOSIS — C3492 Malignant neoplasm of unspecified part of left bronchus or lung: Secondary | ICD-10-CM

## 2016-08-21 DIAGNOSIS — C778 Secondary and unspecified malignant neoplasm of lymph nodes of multiple regions: Secondary | ICD-10-CM | POA: Diagnosis not present

## 2016-08-21 HISTORY — DX: Liver cell carcinoma: C22.0

## 2016-08-21 LAB — COMPREHENSIVE METABOLIC PANEL
ALK PHOS: 101 U/L (ref 40–150)
ALT: 21 U/L (ref 0–55)
ANION GAP: 9 meq/L (ref 3–11)
AST: 25 U/L (ref 5–34)
Albumin: 3.2 g/dL — ABNORMAL LOW (ref 3.5–5.0)
BUN: 33.4 mg/dL — AB (ref 7.0–26.0)
CALCIUM: 9 mg/dL (ref 8.4–10.4)
CHLORIDE: 106 meq/L (ref 98–109)
CO2: 23 meq/L (ref 22–29)
CREATININE: 1.3 mg/dL (ref 0.7–1.3)
EGFR: 56 mL/min/{1.73_m2} — AB (ref >90)
GLUCOSE: 157 mg/dL — AB (ref 70–140)
POTASSIUM: 4.8 meq/L (ref 3.5–5.1)
Sodium: 138 mEq/L (ref 136–145)
Total Bilirubin: 0.4 mg/dL (ref 0.20–1.20)
Total Protein: 6.3 g/dL — ABNORMAL LOW (ref 6.4–8.3)

## 2016-08-21 LAB — CBC WITH DIFFERENTIAL/PLATELET
BASO%: 0.9 % (ref 0.0–2.0)
BASOS ABS: 0.1 10*3/uL (ref 0.0–0.1)
EOS%: 3.4 % (ref 0.0–7.0)
Eosinophils Absolute: 0.2 10*3/uL (ref 0.0–0.5)
HEMATOCRIT: 39.4 % (ref 38.4–49.9)
HGB: 13.1 g/dL (ref 13.0–17.1)
LYMPH#: 0.9 10*3/uL (ref 0.9–3.3)
LYMPH%: 13.3 % — AB (ref 14.0–49.0)
MCH: 30.9 pg (ref 27.2–33.4)
MCHC: 33.1 g/dL (ref 32.0–36.0)
MCV: 93.3 fL (ref 79.3–98.0)
MONO#: 0.5 10*3/uL (ref 0.1–0.9)
MONO%: 7.6 % (ref 0.0–14.0)
NEUT#: 4.9 10*3/uL (ref 1.5–6.5)
NEUT%: 74.8 % (ref 39.0–75.0)
PLATELETS: 187 10*3/uL (ref 140–400)
RBC: 4.23 10*6/uL (ref 4.20–5.82)
RDW: 15.8 % — ABNORMAL HIGH (ref 11.0–14.6)
WBC: 6.5 10*3/uL (ref 4.0–10.3)

## 2016-08-21 MED ORDER — HEPARIN SOD (PORK) LOCK FLUSH 100 UNIT/ML IV SOLN
500.0000 [IU] | Freq: Once | INTRAVENOUS | Status: AC
  Administered 2016-08-21: 500 [IU] via INTRAVENOUS
  Filled 2016-08-21: qty 5

## 2016-08-21 MED ORDER — SODIUM CHLORIDE 0.9% FLUSH
10.0000 mL | INTRAVENOUS | Status: DC | PRN
  Administered 2016-08-21: 10 mL via INTRAVENOUS
  Filled 2016-08-21: qty 10

## 2016-08-21 NOTE — Progress Notes (Signed)
[place] [phone]   Fax:([phone]) [place], [place] [address]  DIAGNOSIS:  1) Stage IIIB/IV non-small cell lung cancer, adenocarcinoma diagnosed in March of 2015. 2) unresectable hepatocellular carcinoma diagnosed in August 2017  PRIOR THERAPY:  1) Systemic chemotherapy with carboplatin for AUC of 5 and Alimta 500 mg/M2 every 3 weeks. First dose expected on 04/13/2014. Status post 6 cycles. 2)  Maintenance chemotherapy with single agent Alimta 500 mg/M2 every 3 weeks. First cycle on 09/06/2014. Status post 3 cycles. 3) Second course of systemic chemotherapy with carboplatin for AUC of 5 and Alimta 500 MG/M2 every 3 weeks. First cycle 11/08/2014. Status post 6 cycles. 4)  Immunotherapy with Nivolumab 3 MG/KG every 2 weeks. First dose 05/24/2015. Status post 3 cycles. 5) Systemic chemotherapy with docetaxel 60 MG/M2 and Cyramza 10 MG/KG every 3 weeks status post 6 cycles. Last dose was given 11/21/2015 with stable disease. 6) Systemic chemotherapy with docetaxel 75 MG/M2 every 3 weeks. First cycle 05/08/2016. Status post one cycle. Discontinued secondary to intolerance to the Neulasta injection. 7) Systemic chemotherapy with docetaxel 25 MG/M2 every weekly. First cycle 05/29/2016. Status post 9 cycles, last dose was given 07/24/2016. Discontinued secondary to disease progression.  CURRENT THERAPY: Ketruda 200 mg IV every 3 weeks. First dose 08/29/2016.  INTERVAL HISTORY: Shawn Espinoza 75 y.o. male returns to the clinic today for follow up visit accompanied by his wife. The patient is feeling fine today with no specific complaints except for the persistent fatigue. He was recently treated with systemic chemotherapy with weekly docetaxel and tolerated it well fairly well. He denied having any significant chest pain, shortness of breath, cough or hemoptysis. He has no nausea or vomiting,  no fever or chills. He was found on recent CT scan of the chest, abdomen and pelvis to have evidence for disease progression with enlargement of the mediastinal lymph nodes as well as liver metastasis. The patient underwent ultrasound-guided core biopsy of one of the liver lesion by interventional radiology and unfortunately the final pathology was consistent with hepatocellular carcinoma which in comparison with his previous pathology from 2015 was different and the previous one was consistent with lung primary. The patient is here today for evaluation and discussion of his treatment options.  MEDICAL HISTORY: Past Medical History:  Diagnosis Date  . Acute infarction of intestine, part and extent unspecified ([place]) 04/25/2016  . 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 Arthritis   . Atrial fibrillation ([place])   . Constipation   . Depression   . Diabetes mellitus type 2 with retinopathy ([place])   . Diverticulosis of colon   . Dysrhythmia    Atrial fib (found 07/2014)  . Fatty liver 02/29/00   abd ultrasound  . Full code status 09/26/2015  . GERD (gastroesophageal reflux disease)    occasional  . HCAP (healthcare-associated pneumonia) 11/27/2015   IV vanc/zosyn then levaquin courses x2   . History of diabetes mellitus, type II    resolved with diet, h/o neuropathy  . History of tobacco use quit 1990s  . Hyperlipidemia   . Hypertension   . Hypertensive retinopathy of both eyes 2015   mild  . Lobar pneumonia ([place]) 10/18/2015  . Lower back pain   . 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 Neuropathy ([place])   . Non-small cell carcinoma of  left lung ([place]) 02/2014   stage IIIb/IV on chemo  . Personal history of colonic adenomas 07/06/2013  . Pneumonia   . 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 Shortness of breath   . 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 Systolic murmur 6294   2Decho -  normal LV fxn, EF 55%, mild AS, biatrial enlargement    ALLERGIES:  is allergic to candesartan cilexetil; diltiazem hcl; doxazosin mesylate; nifedipine; pravastatin; red yeast rice; rosuvastatin; sertraline; simvastatin; and hydrocodone.  MEDICATIONS:  Current Outpatient Prescriptions  Medication Sig Dispense Refill  . acetaminophen (TYLENOL) 500 MG tablet Take 500 mg by mouth 2 (two) times daily. Reported on 05/06/2016    . albuterol (PROVENTIL HFA;VENTOLIN HFA) 108 (90 BASE) MCG/ACT inhaler Inhale 2 puffs into the lungs every 6 (six) hours as needed for wheezing or shortness of breath. 1 Inhaler 3  . Alum & Mag Hydroxide-Simeth (MAGIC MOUTHWASH W/LIDOCAINE) SOLN Take 5 mLs by mouth 3 (three) times daily as needed for mouth pain. 5 mL 0  . apixaban (ELIQUIS) 5 MG TABS tablet Take 1 tablet (5 mg total) by mouth 2 (two) times daily. Restart when platelets >50.    Marland Kitchen dexamethasone (DECADRON) 4 MG tablet 2 tab po bid, the day before, day of and day after chemotherapy every 3 weeks (Patient taking differently: Take 8 mg by mouth 2 (two) times daily. 2 tab po bid, the day before, day of and day after chemotherapy every 3 weeks) 40 tablet 1  . diphenoxylate-atropine (LOMOTIL) 2.5-0.025 MG per tablet Take 2 tablets by mouth 4 (four) times daily as needed for diarrhea or loose stools. 30 tablet 0  . glipiZIDE (GLUCOTROL) 5 MG tablet Take 1 tablet (5 mg total) by mouth as directed. Take one daily when taking steroid (dexamethasone).    Marland Kitchen glucose blood (ONE TOUCH ULTRA TEST) test strip Use to check sugar once daily and as needed. Dx: E09.9 100 each 3  . guaiFENesin (MUCINEX) 600 MG 12 hr tablet Take 600 mg by mouth 2 (two) times daily as needed for cough or to loosen phlegm.     . hydrocortisone (ANUSOL-HC) 25 MG suppository Place 1 suppository (25 mg total) rectally 2 (two) times daily. (Patient taking differently: Place 25 mg rectally 2 (two) times daily as needed for hemorrhoids or itching. ) 12 suppository  0  . KLOR-CON M10 10 MEQ tablet Take 2 tablets (20 mEq total) by mouth every other day. 30 tablet 6  . lidocaine-prilocaine (EMLA) cream Apply 1 application topically as needed. (Patient taking differently: Apply 1 application topically as needed (for port access). ) 30 g 1  . methocarbamol (ROBAXIN) 500 MG tablet Take 500 mg by mouth 3 (three) times daily as needed. Muscle pain    . metoprolol tartrate (LOPRESSOR) 50 MG tablet Take 0.5 tablets (25 mg total) by mouth 2 (two) times daily. 30 tablet 6  . Multiple Vitamins-Minerals (ICAPS) CAPS Take 1 capsule by mouth 2 (two) times daily.     [name] DELICA LANCETS 76L MISC Use to check sugar once daily and as needed. Dx:E09.9 100 each 3  . polyethylene glycol (MIRALAX / GLYCOLAX) packet Take 8.5 g by mouth daily as needed for moderate constipation.     Marland Kitchen spironolactone (ALDACTONE) 50 MG tablet Take 1 tablet (50 mg total) by mouth daily.    . sucralfate (CARAFATE) 1 g tablet Take 1 tablet (1 g total) by mouth 3 (three) times daily  before meals. 90 tablet 1  . torsemide (DEMADEX) 20 MG tablet Take 1 tablet (20 mg total) by mouth every other day. 30 tablet 6  . traMADol (ULTRAM) 50 MG tablet Take 1 tablet (50 mg total) by mouth every 6 (six) hours as needed. (Patient taking differently: Take 50 mg by mouth every 6 (six) hours as needed for moderate pain. ) 30 tablet 3   No current facility-administered medications for this visit.    Facility-Administered Medications Ordered in Other Visits  Medication Dose Route Frequency Provider Last Rate Last Dose  . sodium chloride flush (NS) 0.9 % injection 10 mL  10 mL Intravenous PRN Curt Bears, MD   10 mL at 08/21/16 1353    SURGICAL HISTORY:  Past Surgical History:  Procedure Laterality Date  . CATARACT EXTRACTION Right 03/2015   Dr [name]  . CHEST TUBE INSERTION Left 08/26/2014   Procedure: INSERTION OF LEFT  PLEURAL DRAINAGE CATHETER;  Surgeon: Ivin Poot, MD;  Location: [facility];  Service:  Thoracic;  Laterality: Left;  . COLONOSCOPY  2004  . COLONOSCOPY  2014   tubular adenoma x1, mod diverticulosis ([place])  . FLEXIBLE BRONCHOSCOPY  02/2014   WNL  . PORTACATH PLACEMENT Right 03/31/2015   Procedure: INSERTION PORT-A-CATH;  Surgeon: Ivin Poot, MD;  Location: [facility];  Service: Thoracic;  Laterality: Right;  . REMOVAL OF PLEURAL DRAINAGE CATHETER Left 03/31/2015   Procedure: REMOVAL OF PLEURAL DRAINAGE CATHETER;  Surgeon: Ivin Poot, MD;  Location: [facility];  Service: Thoracic;  Laterality: Left;  Marland Kitchen VIDEO BRONCHOSCOPY Bilateral 03/17/2014   Procedure: VIDEO BRONCHOSCOPY WITH FLUORO;  Surgeon: Tanda Rockers, MD;  Location: [facility];  Service: Cardiopulmonary;  Laterality: Bilateral;    REVIEW OF SYSTEMS:  Constitutional: positive for fatigue Eyes: negative Ears, nose, mouth, throat, and face: negative Respiratory: positive for dyspnea on exertion Cardiovascular: negative Gastrointestinal: negative Genitourinary:negative Integument/breast: negative Hematologic/lymphatic: negative Musculoskeletal:positive for arthralgias Neurological: negative Behavioral/Psych: negative Endocrine: negative Allergic/Immunologic: negative   PHYSICAL EXAMINATION: General appearance: alert, cooperative, fatigued and no distress Head: Normocephalic, without obvious abnormality, atraumatic Neck: no adenopathy, no JVD, supple, symmetrical, trachea midline and thyroid not enlarged, symmetric, no tenderness/mass/nodules Lymph nodes: Cervical, supraclavicular, and axillary nodes normal. Resp: diminished breath sounds LLL and dullness to percussion LLL Back: symmetric, no curvature. ROM normal. No CVA tenderness. Cardio: regular rate and rhythm, S1, S2 normal, no murmur, click, rub or gallop GI: soft, non-tender; bowel sounds normal; no masses,  no organomegaly Extremities: edema 2+ Neurologic: Alert and oriented X 3, normal strength and tone. Normal symmetric reflexes. Normal coordination  and gait  ECOG PERFORMANCE STATUS: 1 - Symptomatic but completely ambulatory  Blood pressure 110/67, pulse 72, temperature 97.9 F (36.6 C), temperature source Oral, resp. rate 18, height '5\' 9"'$  (1.753 m), weight 171 lb 6.4 oz (77.7 kg), SpO2 99 %.  LABORATORY DATA: Lab Results  Component Value Date   WBC 6.5 08/21/2016   HGB 13.1 08/21/2016   HCT 39.4 08/21/2016   MCV 93.3 08/21/2016   PLT 187 08/21/2016      Chemistry      Component Value Date/Time   NA 139 07/31/2016 1107   K 4.8 07/31/2016 1107   CL 103 02/13/2016 1405   CO2 23 07/31/2016 1107   BUN 36.9 (H) 07/31/2016 1107   CREATININE 1.4 (H) 07/31/2016 1107      Component Value Date/Time   CALCIUM 9.3 07/31/2016 1107   ALKPHOS 99 07/31/2016 1107   AST 27  07/31/2016 1107   ALT 25 07/31/2016 1107   BILITOT 0.80 07/31/2016 1107       RADIOGRAPHIC STUDIES: Ct Chest W Contrast  Result Date: 07/29/2016 CLINICAL DATA:  Lung cancer with possible liver metastasis. Ongoing chemotherapy. Bloody drainage from sinuses. Shortness of breath. Left-sided primary, adenocarcinoma. EXAM: CT CHEST, ABDOMEN, AND PELVIS WITH CONTRAST TECHNIQUE: Multidetector CT imaging of the chest, abdomen and pelvis was performed following the standard protocol during bolus administration of intravenous contrast. CONTRAST:  129m ISOVUE-300 IOPAMIDOL (ISOVUE-300) INJECTION 61% COMPARISON:  04/23/2016. FINDINGS: CT CHEST FINDINGS Cardiovascular: Aortic and branch vessel atherosclerosis. Normal heart size, without pericardial effusion. Lad and probable left main coronary artery atherosclerosis. No central pulmonary embolism, on this non-dedicated study. Mediastinum/Nodes: Developing adenopathy at the low left jugular station and left side of the thoracic inlet. Example necrotic 11 mm node on image 7/series 2. Increased from 8 mm on the prior (when remeasured). A right Port-A-Cath terminates at the superior caval/atrial junction. Enlarging left subpectoral node,  including at 9 mm on image 9/series 2. Prevascular index node measures 2.0 cm on image 21/series 2 versus 1.6 cm previously. Right hilar adenopathy at 3.2 x 3.6 cm today versus 2.2 x 3.2 cm previously. Distal esophageal wall thickening, including on image 37/series 2. Likely similar. Lungs/Pleura: Trace left-sided pleural thickening is similar. No pleural fluid. Mild centrilobular and paraseptal emphysema. Left lower lobe interstitial thickening and presumed evolving scar. lateral left lower lobe subpleural nodular density is similar, including at 7 x 6 mm on image 82/series 4. Consolidation at the high superior segment left lower lobe is not significantly changed. Musculoskeletal: Remote right rib trauma. CT ABDOMEN PELVIS FINDINGS Hepatobiliary: Infiltrative high right hepatic lobe mass is difficult to measure secondary to its ill-defined borders. Measures on the order of 8.2 x 7.3 cm on image 50/ series 2. Compare 6.8 x 5.3 cm previously. Extends further into the more inferior right hepatic lobe, including to the craniocaudal level of the right portal vein on image 59/series 2. Extension versus a satellite lesion in the subcapsular right hepatic lobe measures 3.3 cm on image 63/series 2. Moderate caudate lobe enlargement is chronic. Small gallstones without acute cholecystitis or biliary duct dilatation. Pancreas: Normal, without mass or ductal dilatation. Spleen: Normal in size, without focal abnormality. Adrenals/Urinary Tract: Normal adrenal glands. Mild renal cortical thinning bilaterally. No hydronephrosis. Normal urinary bladder. Stomach/Bowel: Normal stomach, without wall thickening. Scattered colonic diverticula. Normal terminal ileum and appendix. Normal small bowel. Vascular/Lymphatic: Aortic and branch vessel atherosclerosis. Ectasia of the left common iliac artery is similar 1.6 cm. No abdominopelvic adenopathy. Reproductive: Mild prostatomegaly. Other: No significant free fluid. Musculoskeletal: No  acute osseous abnormality. IMPRESSION: CT CHEST IMPRESSION 1. Progressive thoracic adenopathy with developing left supraclavicular/low jugular adenopathy. 2. Similar appearance of the lungs, with minimal left lower lobe nodularity and presumed radiation induced interstitial thickening. 3. Distal esophageal wall thickening is similar and likely represents esophagitis. 4.  Coronary artery atherosclerosis. Aortic atherosclerosis. CT ABDOMEN AND PELVIS IMPRESSION 1. Progression of hepatic mass/masses, likely representing metastatic disease. Suspicion of concurrent cirrhosis, given caudate lobe enlargement (and clinical history of positive hepatitis-C antibiotic test). 2. Cholelithiasis. 3. Prostatomegaly. Electronically Signed   By: KAbigail MiyamotoM.D.   On: 07/29/2016 14:16   Ct Abdomen Pelvis W Contrast  Result Date: 07/29/2016 CLINICAL DATA:  Lung cancer with possible liver metastasis. Ongoing chemotherapy. Bloody drainage from sinuses. Shortness of breath. Left-sided primary, adenocarcinoma. EXAM: CT CHEST, ABDOMEN, AND PELVIS WITH CONTRAST TECHNIQUE: Multidetector CT imaging of  the chest, abdomen and pelvis was performed following the standard protocol during bolus administration of intravenous contrast. CONTRAST:  117m ISOVUE-300 IOPAMIDOL (ISOVUE-300) INJECTION 61% COMPARISON:  04/23/2016. FINDINGS: CT CHEST FINDINGS Cardiovascular: Aortic and branch vessel atherosclerosis. Normal heart size, without pericardial effusion. Lad and probable left main coronary artery atherosclerosis. No central pulmonary embolism, on this non-dedicated study. Mediastinum/Nodes: Developing adenopathy at the low left jugular station and left side of the thoracic inlet. Example necrotic 11 mm node on image 7/series 2. Increased from 8 mm on the prior (when remeasured). A right Port-A-Cath terminates at the superior caval/atrial junction. Enlarging left subpectoral node, including at 9 mm on image 9/series 2. Prevascular index node  measures 2.0 cm on image 21/series 2 versus 1.6 cm previously. Right hilar adenopathy at 3.2 x 3.6 cm today versus 2.2 x 3.2 cm previously. Distal esophageal wall thickening, including on image 37/series 2. Likely similar. Lungs/Pleura: Trace left-sided pleural thickening is similar. No pleural fluid. Mild centrilobular and paraseptal emphysema. Left lower lobe interstitial thickening and presumed evolving scar. lateral left lower lobe subpleural nodular density is similar, including at 7 x 6 mm on image 82/series 4. Consolidation at the high superior segment left lower lobe is not significantly changed. Musculoskeletal: Remote right rib trauma. CT ABDOMEN PELVIS FINDINGS Hepatobiliary: Infiltrative high right hepatic lobe mass is difficult to measure secondary to its ill-defined borders. Measures on the order of 8.2 x 7.3 cm on image 50/ series 2. Compare 6.8 x 5.3 cm previously. Extends further into the more inferior right hepatic lobe, including to the craniocaudal level of the right portal vein on image 59/series 2. Extension versus a satellite lesion in the subcapsular right hepatic lobe measures 3.3 cm on image 63/series 2. Moderate caudate lobe enlargement is chronic. Small gallstones without acute cholecystitis or biliary duct dilatation. Pancreas: Normal, without mass or ductal dilatation. Spleen: Normal in size, without focal abnormality. Adrenals/Urinary Tract: Normal adrenal glands. Mild renal cortical thinning bilaterally. No hydronephrosis. Normal urinary bladder. Stomach/Bowel: Normal stomach, without wall thickening. Scattered colonic diverticula. Normal terminal ileum and appendix. Normal small bowel. Vascular/Lymphatic: Aortic and branch vessel atherosclerosis. Ectasia of the left common iliac artery is similar 1.6 cm. No abdominopelvic adenopathy. Reproductive: Mild prostatomegaly. Other: No significant free fluid. Musculoskeletal: No acute osseous abnormality. IMPRESSION: CT CHEST IMPRESSION 1.  Progressive thoracic adenopathy with developing left supraclavicular/low jugular adenopathy. 2. Similar appearance of the lungs, with minimal left lower lobe nodularity and presumed radiation induced interstitial thickening. 3. Distal esophageal wall thickening is similar and likely represents esophagitis. 4.  Coronary artery atherosclerosis. Aortic atherosclerosis. CT ABDOMEN AND PELVIS IMPRESSION 1. Progression of hepatic mass/masses, likely representing metastatic disease. Suspicion of concurrent cirrhosis, given caudate lobe enlargement (and clinical history of positive hepatitis-C antibiotic test). 2. Cholelithiasis. 3. Prostatomegaly. Electronically Signed   By: KAbigail MiyamotoM.D.   On: 07/29/2016 14:16   [place]  Result Date: 08/08/2016 INDICATION: 75year old male with a history of lung carcinoma and liver mass. He has been referred for biopsy. EXAM: ULTRASOUND BIOPSY CORE LIVER MEDICATIONS: None. ANESTHESIA/SEDATION: Moderate (conscious) sedation was employed during this procedure. A total of Versed 1.0 mg and Fentanyl 50 mcg was administered intravenously. Moderate Sedation Time: 11 minutes. The patient's level of consciousness and vital signs were monitored continuously by radiology nursing throughout the procedure under my direct supervision. FLUOROSCOPY TIME:  None COMPLICATIONS: None PROCEDURE: Informed written consent was obtained from the patient after a thorough discussion of the procedural risks, benefits and alternatives. All questions  were addressed. Maximal Sterile Barrier Technique was utilized including caps, mask, sterile gowns, sterile gloves, sterile drape, hand hygiene and skin antiseptic. A timeout was performed prior to the initiation of the procedure. Patient is position in the supine position on the ultrasound stretcher. Images were stored and sent to PACs. The patient was then prepped and draped in the usual sterile fashion. The skin and subcutaneous tissues were generously  infiltrated 1% lidocaine for local anesthesia. Guide needle was advanced into the mass of the right liver lobe. Multiple core biopsy were achieved. Three Gel-Foam pledgets were then infused with a small amount of saline and the needle was removed. Final image was stored. The patient tolerated the procedure well and remained hemodynamically stable throughout. No complications were encountered and no significant blood loss encountered. IMPRESSION: Status post ultrasound-guided biopsy of right liver mass. Tissue specimen sent to pathology for complete histopathologic analysis. Signed, Dulcy Fanny. Earleen Newport, DO [facility] Electronically Signed   By: Corrie Mckusick D.O.   On: 08/08/2016 11:00   ASSESSMENT AND PLAN: this is a very pleasant 75 years old white male with:  1) Stage IV non-small cell lung cancer, adenocarcinoma presenting with large right upper lobe lung mass in addition to mediastinal and bilateral hilar lymphadenopathy as well as cervical lymphadenopathy and left pleural effusion. The patient completed a course systemic chemotherapy with carboplatin and Alimta status post 6 cycles. This was followed by 3 cycles of maintenance chemotherapy with single agent Alimta. The patient tolerated his treatment well. He had evidence for disease progression and the patient was started on treatment again with carboplatin and Alimta status post 6 cycles. He was treated with 4 cycles of immunotherapy with Nivolumab discontinued secondary to disease progression. The patient is currently on systemic chemotherapy with docetaxel and Cyramza status post 6 cycles and tolerated the treatment well except for fatigue and feels admission with recurrent pneumonia.  He has been on observation for the last 6 months. Unfortunately the recent CT scan of the chest, abdomen and pelvis showed evidence for disease progression with increase in the mediastinal lymphadenopathy as  well as increase in size of retrocrural lymph node and gastrohepatic lymphadenopathy. He was started on systemic chemotherapy with docetaxel 75 MG/M2 every 3 weeks with Neulasta support. The patient tolerated the first cycle well except for the significant pain and arthralgias with the Neulasta injection and he doesn't think that he can continue with the same regimen including the Neulasta injection. I switched his treatment to docetaxel 25 MG/M2 on weekly basis and he is tolerating it much better. He is status post 9 weekly doses. Unfortunately the recent CT scan of the chest, abdomen and pelvis showed evidence for disease progression in the lung and liver.  I recommended for him to discontinue his current treatment with docetaxel.  2) hepatocellular carcinoma diagnosed in August 2017: I had a lengthy discussion with the patient and his wife about the new findings from the ultrasound-guided core biopsy of the liver lesion that showed different malignancy from his initial diagnosis of lung cancer. This is unresectable hepatocellular carcinoma. I discussed with the patient several options for treatment of his condition including palliative care versus palliative oral treatment with sorafenib versus consideration of treatment with immunotherapy with [name] which may be a good option taken into consideration his persistent and progressive non-small cell lung cancer. The patient is interested in proceeding with the treatment with immunotherapy. He will be treated with Ketruda 200 mg IV every  3 weeks. I discussed with the patient adverse effect of this treatment including but not limited to immune mediated skin rash, diarrhea, inflammation of the lung, liver, kidney as well as thyroid or other endocrine dysfunction. He is expected to start the first dose of this treatment next week. He would come back for follow-up visit in 4 weeks with the start of cycle #2.  2) Atrial fibrillation: continue  Eliquis.  3) lower extremity edema: Improved. Continue Demadex.  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 I contacted his primary care physician office for management of this condition since they ordered the MRI of the brain. I recommended for the patient to continue his current treatment to his Eliquis and advise him to start taking aspirin daily until he sees a neurologist.  5) CODE STATUS: Previously discussed with the patient and his wife. He would like initial resuscitation but no prolonged support if in vegetative state.  He was advised to call immediately if he has any concerning symptoms in the interval.  The patient voices understanding of current disease status and treatment options and is in agreement with the current care plan.  All questions were answered. The patient knows to call the clinic with any problems, questions or concerns. We can certainly see the patient much sooner if necessary.  Disclaimer: This note was dictated with voice recognition software. Similar sounding words can inadvertently be transcribed and may not be corrected upon review.

## 2016-08-21 NOTE — Patient Instructions (Signed)

## 2016-08-22 ENCOUNTER — Encounter: Payer: Self-pay | Admitting: Family Medicine

## 2016-08-23 ENCOUNTER — Encounter (HOSPITAL_COMMUNITY): Payer: Self-pay

## 2016-08-27 DIAGNOSIS — H35371 Puckering of macula, right eye: Secondary | ICD-10-CM | POA: Diagnosis not present

## 2016-08-27 DIAGNOSIS — H26492 Other secondary cataract, left eye: Secondary | ICD-10-CM | POA: Diagnosis not present

## 2016-08-27 DIAGNOSIS — H5310 Unspecified subjective visual disturbances: Secondary | ICD-10-CM | POA: Diagnosis not present

## 2016-08-27 DIAGNOSIS — H04123 Dry eye syndrome of bilateral lacrimal glands: Secondary | ICD-10-CM | POA: Diagnosis not present

## 2016-08-27 DIAGNOSIS — H353130 Nonexudative age-related macular degeneration, bilateral, stage unspecified: Secondary | ICD-10-CM | POA: Diagnosis not present

## 2016-08-27 DIAGNOSIS — E1165 Type 2 diabetes mellitus with hyperglycemia: Secondary | ICD-10-CM | POA: Diagnosis not present

## 2016-08-27 LAB — HM DIABETES EYE EXAM

## 2016-08-28 ENCOUNTER — Encounter: Payer: Medicare Other | Admitting: Cardiothoracic Surgery

## 2016-08-29 ENCOUNTER — Ambulatory Visit (HOSPITAL_BASED_OUTPATIENT_CLINIC_OR_DEPARTMENT_OTHER): Payer: Medicare Other

## 2016-08-29 ENCOUNTER — Ambulatory Visit: Payer: Medicare Other

## 2016-08-29 ENCOUNTER — Other Ambulatory Visit (HOSPITAL_BASED_OUTPATIENT_CLINIC_OR_DEPARTMENT_OTHER): Payer: Medicare Other

## 2016-08-29 VITALS — BP 126/73 | HR 71 | Temp 98.0°F | Resp 20

## 2016-08-29 DIAGNOSIS — C3492 Malignant neoplasm of unspecified part of left bronchus or lung: Secondary | ICD-10-CM

## 2016-08-29 DIAGNOSIS — Z5112 Encounter for antineoplastic immunotherapy: Secondary | ICD-10-CM | POA: Diagnosis present

## 2016-08-29 DIAGNOSIS — C220 Liver cell carcinoma: Secondary | ICD-10-CM

## 2016-08-29 DIAGNOSIS — R530 Neoplastic (malignant) related fatigue: Secondary | ICD-10-CM

## 2016-08-29 LAB — CBC WITH DIFFERENTIAL/PLATELET
BASO%: 0.3 % (ref 0.0–2.0)
BASOS ABS: 0 10*3/uL (ref 0.0–0.1)
EOS%: 3.8 % (ref 0.0–7.0)
Eosinophils Absolute: 0.2 10*3/uL (ref 0.0–0.5)
HCT: 42.1 % (ref 38.4–49.9)
HGB: 14 g/dL (ref 13.0–17.1)
LYMPH%: 16.5 % (ref 14.0–49.0)
MCH: 30.7 pg (ref 27.2–33.4)
MCHC: 33.3 g/dL (ref 32.0–36.0)
MCV: 92.3 fL (ref 79.3–98.0)
MONO#: 0.5 10*3/uL (ref 0.1–0.9)
MONO%: 7.5 % (ref 0.0–14.0)
NEUT#: 4.5 10*3/uL (ref 1.5–6.5)
NEUT%: 71.9 % (ref 39.0–75.0)
Platelets: 179 10*3/uL (ref 140–400)
RBC: 4.56 10*6/uL (ref 4.20–5.82)
RDW: 15.4 % — ABNORMAL HIGH (ref 11.0–14.6)
WBC: 6.3 10*3/uL (ref 4.0–10.3)
lymph#: 1 10*3/uL (ref 0.9–3.3)
nRBC: 0 % (ref 0–0)

## 2016-08-29 LAB — COMPREHENSIVE METABOLIC PANEL
ALK PHOS: 104 U/L (ref 40–150)
ALT: 26 U/L (ref 0–55)
AST: 28 U/L (ref 5–34)
Albumin: 3.3 g/dL — ABNORMAL LOW (ref 3.5–5.0)
Anion Gap: 10 mEq/L (ref 3–11)
BUN: 32.4 mg/dL — ABNORMAL HIGH (ref 7.0–26.0)
CO2: 23 meq/L (ref 22–29)
Calcium: 9.2 mg/dL (ref 8.4–10.4)
Chloride: 105 mEq/L (ref 98–109)
Creatinine: 1.1 mg/dL (ref 0.7–1.3)
EGFR: 62 mL/min/{1.73_m2} — AB (ref >90)
GLUCOSE: 117 mg/dL (ref 70–140)
POTASSIUM: 4.8 meq/L (ref 3.5–5.1)
SODIUM: 137 meq/L (ref 136–145)
Total Bilirubin: 0.54 mg/dL (ref 0.20–1.20)
Total Protein: 6.6 g/dL (ref 6.4–8.3)

## 2016-08-29 LAB — TSH: TSH: 2.789 m(IU)/L (ref 0.320–4.118)

## 2016-08-29 MED ORDER — SODIUM CHLORIDE 0.9% FLUSH
10.0000 mL | INTRAVENOUS | Status: DC | PRN
  Administered 2016-08-29: 10 mL
  Filled 2016-08-29: qty 10

## 2016-08-29 MED ORDER — SODIUM CHLORIDE 0.9 % IV SOLN
200.0000 mg | Freq: Once | INTRAVENOUS | Status: AC
  Administered 2016-08-29: 200 mg via INTRAVENOUS
  Filled 2016-08-29: qty 8

## 2016-08-29 MED ORDER — HEPARIN SOD (PORK) LOCK FLUSH 100 UNIT/ML IV SOLN
500.0000 [IU] | Freq: Once | INTRAVENOUS | Status: AC | PRN
  Administered 2016-08-29: 500 [IU]
  Filled 2016-08-29: qty 5

## 2016-08-29 MED ORDER — SODIUM CHLORIDE 0.9 % IV SOLN
Freq: Once | INTRAVENOUS | Status: AC
  Administered 2016-08-29: 15:00:00 via INTRAVENOUS

## 2016-08-29 NOTE — Patient Instructions (Signed)
[facility] Discharge Instructions for Patients Receiving Chemotherapy  Today you received the following chemotherapy agents Keytruda. To help prevent nausea and vomiting after your treatment, we encourage you to take your nausea medication as directed.  If you develop nausea and vomiting that is not controlled by your nausea medication, call the clinic.   BELOW ARE SYMPTOMS THAT SHOULD BE REPORTED IMMEDIATELY:  *FEVER GREATER THAN 100.5 F  *CHILLS WITH OR WITHOUT FEVER  NAUSEA AND VOMITING THAT IS NOT CONTROLLED WITH YOUR NAUSEA MEDICATION  *UNUSUAL SHORTNESS OF BREATH  *UNUSUAL BRUISING OR BLEEDING  TENDERNESS IN MOUTH AND THROAT WITH OR WITHOUT PRESENCE OF ULCERS  *URINARY PROBLEMS  *BOWEL PROBLEMS  UNUSUAL RASH Items with * indicate a potential emergency and should be followed up as soon as possible.  Feel free to call the clinic you have any questions or concerns. The clinic phone number is [phone].  Please show the [facility] at check-in to the Emergency Department and triage nurse.    Pembrolizumab injection What is this medicine? PEMBROLIZUMAB (pem broe liz ue mab) is a monoclonal antibody. It is used to treat melanoma and non-small cell lung cancer. This medicine may be used for other purposes; ask your health care provider or pharmacist if you have questions. What should I tell my health care provider before I take this medicine? They need to know if you have any of these conditions: -diabetes -immune system problems -inflammatory bowel disease -liver disease -lung or breathing disease -lupus -an unusual or allergic reaction to pembrolizumab, other medicines, foods, dyes, or preservatives -pregnant or trying to get pregnant -breast-feeding How should I use this medicine? This medicine is for infusion into a vein. It is given by a health care professional in a hospital or clinic setting. A special MedGuide will be given to you  before each treatment. Be sure to read this information carefully each time. Talk to your pediatrician regarding the use of this medicine in children. Special care may be needed. Overdosage: If you think you have taken too much of this medicine contact a poison control center or emergency room at once. NOTE: This medicine is only for you. Do not share this medicine with others. What if I miss a dose? It is important not to miss your dose. Call your doctor or health care professional if you are unable to keep an appointment. What may interact with this medicine? Interactions have not been studied. Give your health care provider a list of all the medicines, herbs, non-prescription drugs, or dietary supplements you use. Also tell them if you smoke, drink alcohol, or use illegal drugs. Some items may interact with your medicine. This list may not describe all possible interactions. Give your health care provider a list of all the medicines, herbs, non-prescription drugs, or dietary supplements you use. Also tell them if you smoke, drink alcohol, or use illegal drugs. Some items may interact with your medicine. What should I watch for while using this medicine? Your condition will be monitored carefully while you are receiving this medicine. You may need blood work done while you are taking this medicine. Do not become pregnant while taking this medicine or for 4 months after stopping it. Women should inform their doctor if they wish to become pregnant or think they might be pregnant. There is a potential for serious side effects to an unborn child. Talk to your health care professional or pharmacist for more information. Do not breast-feed an infant while  taking this medicine or for 4 months after the last dose. What side effects may I notice from receiving this medicine? Side effects that you should report to your doctor or health care professional as soon as possible: -allergic reactions like skin  rash, itching or hives, swelling of the face, lips, or tongue -bloody or black, tarry stools -breathing problems -change in the amount of urine -changes in vision -chest pain -chills -dark urine -dizziness or feeling faint or lightheaded -fast or irregular heartbeat -fever -flushing -hair loss -muscle pain -muscle weakness -persistent headache -signs and symptoms of high blood sugar such as dizziness; dry mouth; dry skin; fruity breath; nausea; stomach pain; increased hunger or thirst; increased urination -signs and symptoms of liver injury like dark urine, light-colored stools, loss of appetite, nausea, right upper belly pain, yellowing of the eyes or skin -stomach pain -weight loss Side effects that usually do not require medical attention (Report these to your doctor or health care professional if they continue or are bothersome.):constipation -cough -diarrhea -joint pain -tiredness This list may not describe all possible side effects. Call your doctor for medical advice about side effects. You may report side effects to FDA at 1-800-FDA-1088. Where should I keep my medicine? This drug is given in a hospital or clinic and will not be stored at home. NOTE: This sheet is a summary. It may not cover all possible information. If you have questions about this medicine, talk to your doctor, pharmacist, or health care provider.    2016, Elsevier/Gold Standard. (2015-02-07 17:24:19)

## 2016-08-30 ENCOUNTER — Encounter: Payer: Self-pay | Admitting: Cardiothoracic Surgery

## 2016-08-30 ENCOUNTER — Ambulatory Visit (INDEPENDENT_AMBULATORY_CARE_PROVIDER_SITE_OTHER): Payer: Medicare Other | Admitting: Cardiothoracic Surgery

## 2016-08-30 ENCOUNTER — Telehealth: Payer: Self-pay | Admitting: *Deleted

## 2016-08-30 VITALS — BP 116/69 | HR 72 | Resp 18 | Ht 69.0 in | Wt 165.0 lb

## 2016-08-30 DIAGNOSIS — C220 Liver cell carcinoma: Secondary | ICD-10-CM

## 2016-08-30 DIAGNOSIS — J910 Malignant pleural effusion: Secondary | ICD-10-CM | POA: Diagnosis not present

## 2016-08-30 DIAGNOSIS — C3492 Malignant neoplasm of unspecified part of left bronchus or lung: Secondary | ICD-10-CM

## 2016-08-30 DIAGNOSIS — I63432 Cerebral infarction due to embolism of left posterior cerebral artery: Secondary | ICD-10-CM

## 2016-08-30 DIAGNOSIS — Z95828 Presence of other vascular implants and grafts: Secondary | ICD-10-CM | POA: Diagnosis not present

## 2016-08-30 DIAGNOSIS — I63422 Cerebral infarction due to embolism of left anterior cerebral artery: Secondary | ICD-10-CM

## 2016-08-30 NOTE — Telephone Encounter (Signed)
TC to patient's home # to follow up after 1st time Keytruda.  Pt not home at this time but this writer was able to speak with pt's wife. She states he did well after Keytruda yesterday and slept well during the night. She states his face was flushed for little bit last night but looks normal this morning.  He is at Dr. Lucianne Lei Trigt's office for scheduled appt at this time.  Wife voices understanding to call [facility] with any questions or concerns.

## 2016-08-30 NOTE — Progress Notes (Signed)
PCP is [name], MD Referring Provider is Curt Bears, MD  Chief Complaint  Patient presents with  . 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 The patient had repeat CT scans last month which I personally reviewed and counseled with patient. There is evidence of bilateral mediastinal adenopathy now slightly enlarging since the last surveillance scans. Also the patient has a new liver malignancy-hepatocellular carcinoma established by trans-cutaneous liver biopsy. 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 Functionally the patient still feels fairly well.  He was started on immunotherapy recently by his oncologist Dr. [name]. He feels slightly weaker with some abdominal fullness after the immunotherapy injection.  There is no evidence of recurrent left malignant pleural effusion on his x-ray.  Right Pleurx catheter remains intact without evidence of infection  Past Medical History:  Diagnosis Date  . Acute infarction of intestine, part and extent unspecified ([place]) 04/25/2016  . 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 Arthritis   . Atrial fibrillation ([place])   . Constipation   . Depression   . Diabetes mellitus type 2 with retinopathy ([place])   . Diverticulosis of colon   . Dysrhythmia    Atrial fib (found 07/2014)  . Fatty liver 02/29/00   abd ultrasound  . Full code status 09/26/2015  . GERD (gastroesophageal reflux disease)    occasional  . HCAP (healthcare-associated pneumonia) 11/27/2015   IV vanc/zosyn then levaquin courses x2   . Hepatocellular carcinoma ([place]) 08/21/2016  . History of diabetes mellitus, type II    resolved with diet, h/o neuropathy  .  History of tobacco use quit 1990s  . Hyperlipidemia   . Hypertension   . Hypertensive retinopathy of both eyes 2015   mild  . Lobar pneumonia ([place]) 10/18/2015  . Lower back pain   . 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 Neuropathy ([place])   . 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 Personal history of colonic adenomas 07/06/2013  . Pneumonia   . 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 Shortness of breath   . 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 Systolic murmur 9604   2Decho - normal LV fxn, EF 55%, mild AS, biatrial enlargement    Past Surgical History:  Procedure Laterality Date  . CATARACT EXTRACTION Right 03/2015   Dr [name]  . CHEST TUBE INSERTION Left 08/26/2014   Procedure: INSERTION OF LEFT  PLEURAL DRAINAGE CATHETER;  Surgeon: Ivin Poot, MD;  Location: [facility];  Service: Thoracic;  Laterality: Left;  . COLONOSCOPY  2004  . COLONOSCOPY  2014   tubular adenoma x1, mod diverticulosis ([place])  . FLEXIBLE BRONCHOSCOPY  02/2014   WNL  . PORTACATH PLACEMENT Right 03/31/2015   Procedure: INSERTION PORT-A-CATH;  Surgeon: Ivin Poot, MD;  Location: [facility];  Service: Thoracic;  Laterality: Right;  . REMOVAL OF PLEURAL DRAINAGE CATHETER Left 03/31/2015   Procedure: REMOVAL OF PLEURAL DRAINAGE CATHETER;  Surgeon: Ivin Poot, MD;  Location: [facility];  Service: Thoracic;  Laterality: Left;  Marland Kitchen VIDEO BRONCHOSCOPY Bilateral 03/17/2014   Procedure:  VIDEO BRONCHOSCOPY WITH FLUORO;  Surgeon: Tanda Rockers, MD;  Location: [facility];  Service: Cardiopulmonary;  Laterality: Bilateral;    Family History  Problem Relation Age of Onset  . Stroke Mother     multiple mini strokes  . Hypertension Mother   . Prostate cancer Father   . Heart disease Maternal Grandfather     MI  . Stroke Paternal Grandmother     Social History Social History  Substance Use Topics  .  Smoking status: Former Smoker    Packs/day: 3.00    Years: 30.00    Types: Cigarettes    Quit date: 12/23/1988  . Smokeless tobacco: Never Used  . Alcohol use 12.6 oz/week    21 Glasses of wine per week     Comment: 3-4 wine a day ( 8/03- 2 beers, 2 wines, 2 brandies daily)    Current Outpatient Prescriptions  Medication Sig Dispense Refill  . acetaminophen (TYLENOL) 500 MG tablet Take 500 mg by mouth 2 (two) times daily. Reported on 05/06/2016    . albuterol (PROVENTIL HFA;VENTOLIN HFA) 108 (90 BASE) MCG/ACT inhaler Inhale 2 puffs into the lungs every 6 (six) hours as needed for wheezing or shortness of breath. 1 Inhaler 3  . apixaban (ELIQUIS) 5 MG TABS tablet Take 1 tablet (5 mg total) by mouth 2 (two) times daily. Restart when platelets >50.    Marland Kitchen dexamethasone (DECADRON) 4 MG tablet 2 tab po bid, the day before, day of and day after chemotherapy every 3 weeks (Patient taking differently: Take 8 mg by mouth 2 (two) times daily. 2 tab po bid, the day before, day of and day after chemotherapy every 3 weeks) 40 tablet 1  . glipiZIDE (GLUCOTROL) 5 MG tablet Take 1 tablet (5 mg total) by mouth as directed. Take one daily when taking steroid (dexamethasone).    Marland Kitchen glucose blood (ONE TOUCH ULTRA TEST) test strip Use to check sugar once daily and as needed. Dx: E09.9 100 each 3  . guaiFENesin (MUCINEX) 600 MG 12 hr tablet Take 600 mg by mouth 2 (two) times daily as needed for cough or to loosen phlegm.     . hydrocortisone (ANUSOL-HC) 25 MG suppository Place 1 suppository (25 mg total) rectally 2 (two) times daily. (Patient taking differently: Place 25 mg rectally 2 (two) times daily as needed for hemorrhoids or itching. ) 12 suppository 0  . KLOR-CON M10 10 MEQ tablet Take 2 tablets (20 mEq total) by mouth every other day. 30 tablet 6  . lidocaine-prilocaine (EMLA) cream Apply 1 application topically as needed. (Patient taking differently: Apply 1 application topically as needed (for port access). ) 30  g 1  . methocarbamol (ROBAXIN) 500 MG tablet Take 500 mg by mouth 3 (three) times daily as needed. Muscle pain    . metoprolol tartrate (LOPRESSOR) 50 MG tablet Take 0.5 tablets (25 mg total) by mouth 2 (two) times daily. 30 tablet 6  . Multiple Vitamins-Minerals (ICAPS) CAPS Take 1 capsule by mouth 2 (two) times daily.     [name] DELICA LANCETS 62B MISC Use to check sugar once daily and as needed. Dx:E09.9 100 each 3  . polyethylene glycol (MIRALAX / GLYCOLAX) packet Take 8.5 g by mouth daily as needed for moderate constipation.     Marland Kitchen spironolactone (ALDACTONE) 50 MG tablet Take 1 tablet (50 mg total) by mouth daily.    . sucralfate (CARAFATE) 1 g tablet Take 1 tablet (1 g total) by mouth 3 (three) times  daily before meals. 90 tablet 1  . torsemide (DEMADEX) 20 MG tablet Take 1 tablet (20 mg total) by mouth every other day. 30 tablet 6  . traMADol (ULTRAM) 50 MG tablet Take 1 tablet (50 mg total) by mouth every 6 (six) hours as needed. (Patient taking differently: Take 50 mg by mouth every 6 (six) hours as needed for moderate pain. ) 30 tablet 3   No current facility-administered medications for this visit.     Allergies  Allergen Reactions  . Candesartan Cilexetil     REACTION: Muscle spasms  . Diltiazem Hcl     REACTION: Dizziness  . Doxazosin Mesylate Other (See Comments)    REACTION: H/A's  . Nifedipine     REACTION: Leg swelling  . Pravastatin Other (See Comments)    Muscle aches  . Red Yeast Rice     Muscle aches  . Rosuvastatin Other (See Comments)    REACTION: questionable: severe constipation  . Sertraline Other (See Comments)    oversedation  . Simvastatin Other (See Comments)    REACTION: Muscle aches  . Hydrocodone Other (See Comments)    constipation    Review of Systems  Weight stable No nausea Mild abdominal swelling without ankle edema No new neurologic symptoms since transient right visual field cut 2 months ago probably related to A. fib  BP 116/69    Pulse 72   Resp 18   Ht '5\' 9"'$  (1.753 m)   Wt 165 lb (74.8 kg)   SpO2 98% Comment: ON RA  BMI 24.37 kg/m  Physical Exam       Exam    General- alert and comfortable appears to have lost one or 2 pounds   Lungs- clear without rales, wheezes   Cor- regular rate and rhythm, no murmur , gallop   Abdomen- soft, non-tender with probable mild ascites   Extremities - warm, non-tender, minimal edema   Neuro- oriented, appropriate, no focal weakness   Diagnostic Tests: CT scans performed last month reviewed with patient showing increase in bilateral mediastinal adenopathy but no new pleural effusions  Impression: Advanced stage non-small cell carcinoma lung Hepatocellular carcinoma of the liver  Plan  will  see the patient back in 3 months with chest x-ray.  Len Childs, MD [facility] [phone]

## 2016-08-31 ENCOUNTER — Encounter: Payer: Self-pay | Admitting: Family Medicine

## 2016-08-31 DIAGNOSIS — H353190 Nonexudative age-related macular degeneration, unspecified eye, stage unspecified: Secondary | ICD-10-CM | POA: Insufficient documentation

## 2016-08-31 DIAGNOSIS — I63422 Cerebral infarction due to embolism of left anterior cerebral artery: Secondary | ICD-10-CM

## 2016-09-02 ENCOUNTER — Encounter: Payer: Self-pay | Admitting: Family Medicine

## 2016-09-02 ENCOUNTER — Ambulatory Visit (INDEPENDENT_AMBULATORY_CARE_PROVIDER_SITE_OTHER): Payer: Medicare Other | Admitting: Family Medicine

## 2016-09-02 DIAGNOSIS — I63422 Cerebral infarction due to embolism of left anterior cerebral artery: Secondary | ICD-10-CM

## 2016-09-02 DIAGNOSIS — R05 Cough: Secondary | ICD-10-CM

## 2016-09-02 DIAGNOSIS — R059 Cough, unspecified: Secondary | ICD-10-CM

## 2016-09-02 MED ORDER — BENZONATATE 100 MG PO CAPS: 100.0000 mg | ORAL_CAPSULE | Freq: Three times a day (TID) | ORAL | 1 refills | Status: AC | PRN

## 2016-09-02 MED ORDER — DOXYCYCLINE HYCLATE 100 MG PO TABS: 100.0000 mg | ORAL_TABLET | Freq: Two times a day (BID) | ORAL | 0 refills | Status: DC

## 2016-09-02 NOTE — Patient Instructions (Signed)
Tessalon for cough along with the inhaler as needed.  If worse, then start doxy. Take care.  Glad to see you.  Update [place] as needed.

## 2016-09-02 NOTE — Progress Notes (Signed)
Pre visit review using our clinic review tool, if applicable. No additional management support is needed unless otherwise documented below in the visit note. 

## 2016-09-02 NOTE — Progress Notes (Signed)
Lung and liver cancer at baseline.  On tx per Dr. [name].  Cough for the last few days, worse yesterday with discolored sputum.  Throat felt raw.  Using SABA and tessalon at baseline.  Nasal saline helps.  No fevers.  No vomiting except from heaving from the cough.  No nausea o/w.  Eating well.  No blood in stool.   H/o PNA noted.  Not having chest pain but chest was sore yesterday.    Meds, vitals, and allergies reviewed.   ROS: Per HPI unless specifically indicated in ROS section   GEN: nad, alert and oriented HEENT: mucous membranes moist, nasal exam w/o erythema, clear discharge noted,  OP with cobblestoning NECK: supple w/o LA CV: rrr with occ ectopy noted PULM: ctab except for slightly coarser BS in the RLL with some scattered rhonchi, no inc wob EXT: no edema

## 2016-09-03 NOTE — Assessment & Plan Note (Signed)
Nontoxic. The main issue is the fact that he clearly feels better today than yesterday. Discussed with patient. At this point, Tessalon for cough along with the inhaler as needed.  If worse, then start doxy.  He agrees. At this point okay for outpatient follow-up.

## 2016-09-19 ENCOUNTER — Telehealth: Payer: Self-pay | Admitting: Internal Medicine

## 2016-09-19 ENCOUNTER — Ambulatory Visit (HOSPITAL_BASED_OUTPATIENT_CLINIC_OR_DEPARTMENT_OTHER): Payer: Medicare Other

## 2016-09-19 ENCOUNTER — Ambulatory Visit: Payer: Medicare Other

## 2016-09-19 ENCOUNTER — Other Ambulatory Visit (HOSPITAL_BASED_OUTPATIENT_CLINIC_OR_DEPARTMENT_OTHER): Payer: Medicare Other

## 2016-09-19 ENCOUNTER — Ambulatory Visit (HOSPITAL_BASED_OUTPATIENT_CLINIC_OR_DEPARTMENT_OTHER): Payer: Medicare Other | Admitting: Internal Medicine

## 2016-09-19 ENCOUNTER — Encounter: Payer: Self-pay | Admitting: Internal Medicine

## 2016-09-19 VITALS — BP 123/73 | HR 73 | Temp 97.7°F | Resp 19 | Ht 69.0 in | Wt 169.6 lb

## 2016-09-19 DIAGNOSIS — C3411 Malignant neoplasm of upper lobe, right bronchus or lung: Secondary | ICD-10-CM | POA: Diagnosis not present

## 2016-09-19 DIAGNOSIS — I63522 Cerebral infarction due to unspecified occlusion or stenosis of left anterior cerebral artery: Secondary | ICD-10-CM

## 2016-09-19 DIAGNOSIS — Z79899 Other long term (current) drug therapy: Secondary | ICD-10-CM

## 2016-09-19 DIAGNOSIS — R5383 Other fatigue: Secondary | ICD-10-CM | POA: Diagnosis not present

## 2016-09-19 DIAGNOSIS — R530 Neoplastic (malignant) related fatigue: Secondary | ICD-10-CM

## 2016-09-19 DIAGNOSIS — C3492 Malignant neoplasm of unspecified part of left bronchus or lung: Secondary | ICD-10-CM

## 2016-09-19 DIAGNOSIS — R609 Edema, unspecified: Secondary | ICD-10-CM | POA: Diagnosis not present

## 2016-09-19 DIAGNOSIS — J910 Malignant pleural effusion: Secondary | ICD-10-CM | POA: Diagnosis not present

## 2016-09-19 DIAGNOSIS — C220 Liver cell carcinoma: Secondary | ICD-10-CM | POA: Diagnosis not present

## 2016-09-19 DIAGNOSIS — Z95828 Presence of other vascular implants and grafts: Secondary | ICD-10-CM

## 2016-09-19 DIAGNOSIS — C778 Secondary and unspecified malignant neoplasm of lymph nodes of multiple regions: Secondary | ICD-10-CM

## 2016-09-19 DIAGNOSIS — R0609 Other forms of dyspnea: Secondary | ICD-10-CM | POA: Diagnosis not present

## 2016-09-19 DIAGNOSIS — Z5112 Encounter for antineoplastic immunotherapy: Secondary | ICD-10-CM

## 2016-09-19 DIAGNOSIS — I4891 Unspecified atrial fibrillation: Secondary | ICD-10-CM | POA: Diagnosis not present

## 2016-09-19 DIAGNOSIS — Z5111 Encounter for antineoplastic chemotherapy: Secondary | ICD-10-CM | POA: Diagnosis present

## 2016-09-19 LAB — COMPREHENSIVE METABOLIC PANEL
ALT: 35 U/L (ref 0–55)
AST: 32 U/L (ref 5–34)
Albumin: 3.2 g/dL — ABNORMAL LOW (ref 3.5–5.0)
Alkaline Phosphatase: 118 U/L (ref 40–150)
Anion Gap: 12 mEq/L — ABNORMAL HIGH (ref 3–11)
BILIRUBIN TOTAL: 0.74 mg/dL (ref 0.20–1.20)
BUN: 31.1 mg/dL — ABNORMAL HIGH (ref 7.0–26.0)
CHLORIDE: 107 meq/L (ref 98–109)
CO2: 20 meq/L — AB (ref 22–29)
CREATININE: 1.3 mg/dL (ref 0.7–1.3)
Calcium: 9.2 mg/dL (ref 8.4–10.4)
EGFR: 52 mL/min/{1.73_m2} — ABNORMAL LOW (ref >90)
GLUCOSE: 215 mg/dL — AB (ref 70–140)
Potassium: 4.8 mEq/L (ref 3.5–5.1)
SODIUM: 139 meq/L (ref 136–145)
TOTAL PROTEIN: 6.6 g/dL (ref 6.4–8.3)

## 2016-09-19 LAB — CBC WITH DIFFERENTIAL/PLATELET
BASO%: 0.2 % (ref 0.0–2.0)
Basophils Absolute: 0 10*3/uL (ref 0.0–0.1)
EOS%: 2.9 % (ref 0.0–7.0)
Eosinophils Absolute: 0.2 10*3/uL (ref 0.0–0.5)
HCT: 45.4 % (ref 38.4–49.9)
HGB: 14.9 g/dL (ref 13.0–17.1)
LYMPH%: 13.1 % — AB (ref 14.0–49.0)
MCH: 30.2 pg (ref 27.2–33.4)
MCHC: 32.8 g/dL (ref 32.0–36.0)
MCV: 91.9 fL (ref 79.3–98.0)
MONO#: 0.4 10*3/uL (ref 0.1–0.9)
MONO%: 6.1 % (ref 0.0–14.0)
NEUT%: 77.7 % — AB (ref 39.0–75.0)
NEUTROS ABS: 4.6 10*3/uL (ref 1.5–6.5)
Platelets: 167 10*3/uL (ref 140–400)
RBC: 4.94 10*6/uL (ref 4.20–5.82)
RDW: 14.9 % — ABNORMAL HIGH (ref 11.0–14.6)
WBC: 5.9 10*3/uL (ref 4.0–10.3)
lymph#: 0.8 10*3/uL — ABNORMAL LOW (ref 0.9–3.3)

## 2016-09-19 LAB — TSH: TSH: 3.296 m[IU]/L (ref 0.320–4.118)

## 2016-09-19 MED ORDER — SODIUM CHLORIDE 0.9 % IV SOLN
200.0000 mg | Freq: Once | INTRAVENOUS | Status: AC
  Administered 2016-09-19: 200 mg via INTRAVENOUS
  Filled 2016-09-19: qty 8

## 2016-09-19 MED ORDER — SODIUM CHLORIDE 0.9 % IV SOLN
Freq: Once | INTRAVENOUS | Status: AC
  Administered 2016-09-19: 14:00:00 via INTRAVENOUS

## 2016-09-19 MED ORDER — SODIUM CHLORIDE 0.9% FLUSH
10.0000 mL | INTRAVENOUS | Status: DC | PRN
  Administered 2016-09-19: 10 mL
  Filled 2016-09-19: qty 10

## 2016-09-19 MED ORDER — SODIUM CHLORIDE 0.9 % IJ SOLN
10.0000 mL | INTRAMUSCULAR | Status: AC | PRN
  Administered 2016-09-19: 10 mL
  Filled 2016-09-19: qty 10

## 2016-09-19 MED ORDER — HEPARIN SOD (PORK) LOCK FLUSH 100 UNIT/ML IV SOLN
500.0000 [IU] | Freq: Once | INTRAVENOUS | Status: AC | PRN
  Administered 2016-09-19: 500 [IU]
  Filled 2016-09-19: qty 5

## 2016-09-19 NOTE — Telephone Encounter (Signed)
Avs report and appointment schedule given to patient per 09/19/16 los.

## 2016-09-19 NOTE — Patient Instructions (Signed)
[facility] Discharge Instructions for Patients Receiving Chemotherapy  Today you received the following chemotherapy agents: Keytruda   To help prevent nausea and vomiting after your treatment, we encourage you to take your nausea medication as directed    If you develop nausea and vomiting that is not controlled by your nausea medication, call the clinic.   BELOW ARE SYMPTOMS THAT SHOULD BE REPORTED IMMEDIATELY:  *FEVER GREATER THAN 100.5 F  *CHILLS WITH OR WITHOUT FEVER  NAUSEA AND VOMITING THAT IS NOT CONTROLLED WITH YOUR NAUSEA MEDICATION  *UNUSUAL SHORTNESS OF BREATH  *UNUSUAL BRUISING OR BLEEDING  TENDERNESS IN MOUTH AND THROAT WITH OR WITHOUT PRESENCE OF ULCERS  *URINARY PROBLEMS  *BOWEL PROBLEMS  UNUSUAL RASH Items with * indicate a potential emergency and should be followed up as soon as possible.  Feel free to call the clinic you have any questions or concerns. The clinic phone number is [phone].  Please show the CHEMO ALERT CARD at check-in to the Emergency Department and triage nurse.   

## 2016-09-19 NOTE — Progress Notes (Signed)
[facility] Telephone:([phone]   Fax:([phone]) [place], [place] [address]  DIAGNOSIS:  1) Stage IIIB/IV non-small cell lung cancer, adenocarcinoma diagnosed in March of 2015. 2) unresectable hepatocellular carcinoma diagnosed in August 2017  PRIOR THERAPY:  1) Systemic chemotherapy with carboplatin for AUC of 5 and Alimta 500 mg/M2 every 3 weeks. First dose expected on 04/13/2014. Status post 6 cycles. 2)  Maintenance chemotherapy with single agent Alimta 500 mg/M2 every 3 weeks. First cycle on 09/06/2014. Status post 3 cycles. 3) Second course of systemic chemotherapy with carboplatin for AUC of 5 and Alimta 500 MG/M2 every 3 weeks. First cycle 11/08/2014. Status post 6 cycles. 4)  Immunotherapy with Nivolumab 3 MG/KG every 2 weeks. First dose 05/24/2015. Status post 3 cycles. 5) Systemic chemotherapy with docetaxel 60 MG/M2 and Cyramza 10 MG/KG every 3 weeks status post 6 cycles. Last dose was given 11/21/2015 with stable disease. 6) Systemic chemotherapy with docetaxel 75 MG/M2 every 3 weeks. First cycle 05/08/2016. Status post one cycle. Discontinued secondary to intolerance to the Neulasta injection. 7) Systemic chemotherapy with docetaxel 25 MG/M2 every weekly. First cycle 05/29/2016. Status post 9 cycles, last dose was given 07/24/2016. Discontinued secondary to disease progression.  CURRENT THERAPY: Ketruda 200 mg IV every 3 weeks. First dose 08/29/2016. Status post one cycle  INTERVAL HISTORY: Shawn Espinoza 75 y.o. male returns to the clinic today for follow up visit. The patient is feeling fine today with no specific complaints except for the persistent fatigue. He tolerated the first cycle of his treatment with [place] fairly well. He denied having any significant nausea or vomiting. He has no fever or chills. He denied having any significant chest pain but continues to have shortness  of breath with exertion with no cough or hemoptysis. He has no weight loss or night sweats. The patient is here today to start cycle #2 of his immunotherapy with [place].   MEDICAL HISTORY: Past Medical History:  Diagnosis Date  . Acute infarction of intestine, part and extent unspecified ([place]) 04/25/2016  . 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 Arthritis   . Atrial fibrillation ([place])   . Constipation   . Depression   . Diabetes mellitus type 2 with retinopathy ([place])   . Diverticulosis of colon   . Dysrhythmia    Atrial fib (found 07/2014)  . Fatty liver 02/29/00   abd ultrasound  . Full code status 09/26/2015  . GERD (gastroesophageal reflux disease)    occasional  . HCAP (healthcare-associated pneumonia) 11/27/2015   IV vanc/zosyn then levaquin courses x2   . Hepatocellular carcinoma ([place]) 08/21/2016  . History of diabetes mellitus, type II    resolved with diet, h/o neuropathy  . History of tobacco use quit 1990s  . Hyperlipidemia   . Hypertension   . Hypertensive retinopathy of both eyes 2015   mild  . Lobar pneumonia ([place]) 10/18/2015  . Lower back pain   . 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 Neuropathy ([place])   . Non-small cell carcinoma of left lung ([place]) 02/2014   stage IIIb/IV on chemo  . Personal history of colonic adenomas 07/06/2013  . Pneumonia   . 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 Shortness of breath   . 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  Systolic murmur 6759   2Decho - normal LV fxn, EF 55%, mild AS, biatrial enlargement    ALLERGIES:  is allergic to candesartan cilexetil; diltiazem hcl; doxazosin mesylate; nifedipine; pravastatin; red yeast rice; rosuvastatin; sertraline; simvastatin; and hydrocodone.  MEDICATIONS:  Current Outpatient Prescriptions  Medication Sig Dispense Refill  . acetaminophen (TYLENOL) 500 MG tablet Take 500 mg by  mouth 2 (two) times daily. Reported on 05/06/2016    . albuterol (PROVENTIL HFA;VENTOLIN HFA) 108 (90 BASE) MCG/ACT inhaler Inhale 2 puffs into the lungs every 6 (six) hours as needed for wheezing or shortness of breath. 1 Inhaler 3  . apixaban (ELIQUIS) 5 MG TABS tablet Take 1 tablet (5 mg total) by mouth 2 (two) times daily. Restart when platelets >50.    . benzonatate (TESSALON) 100 MG capsule Take 1-2 capsules (100-200 mg total) by mouth 3 (three) times daily as needed for cough. 40 capsule 1  . doxycycline (VIBRA-TABS) 100 MG tablet Take 1 tablet (100 mg total) by mouth 2 (two) times daily. 20 tablet 0  . glipiZIDE (GLUCOTROL) 5 MG tablet Take 1 tablet (5 mg total) by mouth as directed. Take one daily when taking steroid (dexamethasone).    Marland Kitchen glucose blood (ONE TOUCH ULTRA TEST) test strip Use to check sugar once daily and as needed. Dx: E09.9 100 each 3  . guaiFENesin (MUCINEX) 600 MG 12 hr tablet Take 600 mg by mouth 2 (two) times daily as needed for cough or to loosen phlegm.     . hydrocortisone (ANUSOL-HC) 25 MG suppository Place 1 suppository (25 mg total) rectally 2 (two) times daily. (Patient taking differently: Place 25 mg rectally 2 (two) times daily as needed for hemorrhoids or itching. ) 12 suppository 0  . KLOR-CON M10 10 MEQ tablet Take 2 tablets (20 mEq total) by mouth every other day. 30 tablet 6  . lidocaine-prilocaine (EMLA) cream Apply 1 application topically as needed. (Patient taking differently: Apply 1 application topically as needed (for port access). ) 30 g 1  . methocarbamol (ROBAXIN) 500 MG tablet Take 500 mg by mouth 3 (three) times daily as needed. Muscle pain    . metoprolol tartrate (LOPRESSOR) 50 MG tablet Take 0.5 tablets (25 mg total) by mouth 2 (two) times daily. 30 tablet 6  . Multiple Vitamins-Minerals (ICAPS) CAPS Take 1 capsule by mouth 2 (two) times daily.     [name] DELICA LANCETS 16B MISC Use to check sugar once daily and as needed. Dx:E09.9 100 each 3    . polyethylene glycol (MIRALAX / GLYCOLAX) packet Take 8.5 g by mouth daily.     Marland Kitchen spironolactone (ALDACTONE) 50 MG tablet Take 1 tablet (50 mg total) by mouth daily.    . sucralfate (CARAFATE) 1 g tablet Take 1 tablet (1 g total) by mouth 3 (three) times daily before meals. (Patient taking differently: Take 1 g by mouth 3 (three) times daily as needed. ) 90 tablet 1  . torsemide (DEMADEX) 20 MG tablet Take 1 tablet (20 mg total) by mouth every other day. 30 tablet 6  . traMADol (ULTRAM) 50 MG tablet Take 1 tablet (50 mg total) by mouth every 6 (six) hours as needed. (Patient taking differently: Take 50 mg by mouth every 6 (six) hours as needed for moderate pain. ) 30 tablet 3   No current facility-administered medications for this visit.     SURGICAL HISTORY:  Past Surgical History:  Procedure Laterality Date  . CATARACT EXTRACTION Right 03/2015   Dr [name]  .  CHEST TUBE INSERTION Left 08/26/2014   Procedure: INSERTION OF LEFT  PLEURAL DRAINAGE CATHETER;  Surgeon: Ivin Poot, MD;  Location: [facility];  Service: Thoracic;  Laterality: Left;  . COLONOSCOPY  2004  . COLONOSCOPY  2014   tubular adenoma x1, mod diverticulosis ([place])  . FLEXIBLE BRONCHOSCOPY  02/2014   WNL  . PORTACATH PLACEMENT Right 03/31/2015   Procedure: INSERTION PORT-A-CATH;  Surgeon: Ivin Poot, MD;  Location: [facility];  Service: Thoracic;  Laterality: Right;  . REMOVAL OF PLEURAL DRAINAGE CATHETER Left 03/31/2015   Procedure: REMOVAL OF PLEURAL DRAINAGE CATHETER;  Surgeon: Ivin Poot, MD;  Location: [facility];  Service: Thoracic;  Laterality: Left;  Marland Kitchen VIDEO BRONCHOSCOPY Bilateral 03/17/2014   Procedure: VIDEO BRONCHOSCOPY WITH FLUORO;  Surgeon: Tanda Rockers, MD;  Location: [facility];  Service: Cardiopulmonary;  Laterality: Bilateral;    REVIEW OF SYSTEMS:  A comprehensive review of systems was negative except for: Constitutional: positive for fatigue Respiratory: positive for dyspnea on exertion   PHYSICAL  EXAMINATION: General appearance: alert, cooperative, fatigued and no distress Head: Normocephalic, without obvious abnormality, atraumatic Neck: no adenopathy, no JVD, supple, symmetrical, trachea midline and thyroid not enlarged, symmetric, no tenderness/mass/nodules Lymph nodes: Cervical, supraclavicular, and axillary nodes normal. Resp: diminished breath sounds LLL and dullness to percussion LLL Back: symmetric, no curvature. ROM normal. No CVA tenderness. Cardio: regular rate and rhythm, S1, S2 normal, no murmur, click, rub or gallop GI: soft, non-tender; bowel sounds normal; no masses,  no organomegaly Extremities: edema 2+ Neurologic: Alert and oriented X 3, normal strength and tone. Normal symmetric reflexes. Normal coordination and gait  ECOG PERFORMANCE STATUS: 1 - Symptomatic but completely ambulatory  Blood pressure 123/73, pulse 73, temperature 97.7 F (36.5 C), temperature source Oral, resp. rate 19, height '5\' 9"'$  (1.753 m), weight 169 lb 9.6 oz (76.9 kg), SpO2 99 %.  LABORATORY DATA: Lab Results  Component Value Date   WBC 5.9 09/19/2016   HGB 14.9 09/19/2016   HCT 45.4 09/19/2016   MCV 91.9 09/19/2016   PLT 167 09/19/2016      Chemistry      Component Value Date/Time   NA 139 09/19/2016 1115   K 4.8 09/19/2016 1115   CL 103 02/13/2016 1405   CO2 20 (L) 09/19/2016 1115   BUN 31.1 (H) 09/19/2016 1115   CREATININE 1.3 09/19/2016 1115      Component Value Date/Time   CALCIUM 9.2 09/19/2016 1115   ALKPHOS 118 09/19/2016 1115   AST 32 09/19/2016 1115   ALT 35 09/19/2016 1115   BILITOT 0.74 09/19/2016 1115       RADIOGRAPHIC STUDIES: No results found. ASSESSMENT AND PLAN: this is a very pleasant 75 years old white male with:  1) Stage IV non-small cell lung cancer, adenocarcinoma presenting with large right upper lobe lung mass in addition to mediastinal and bilateral hilar lymphadenopathy as well as cervical lymphadenopathy and left pleural effusion. The  patient completed a course systemic chemotherapy with carboplatin and Alimta status post 6 cycles. This was followed by 3 cycles of maintenance chemotherapy with single agent Alimta. The patient tolerated his treatment well. He had evidence for disease progression and the patient was started on treatment again with carboplatin and Alimta status post 6 cycles. He was treated with 4 cycles of immunotherapy with Nivolumab discontinued secondary to disease progression. The patient is currently on systemic chemotherapy with docetaxel and Cyramza status post 6 cycles and tolerated the treatment well except for fatigue  and feels admission with recurrent pneumonia.  He has been on observation for the last 6 months. Unfortunately the recent CT scan of the chest, abdomen and pelvis showed evidence for disease progression with increase in the mediastinal lymphadenopathy as well as increase in size of retrocrural lymph node and gastrohepatic lymphadenopathy. He was started on systemic chemotherapy with docetaxel 75 MG/M2 every 3 weeks with Neulasta support. The patient tolerated the first cycle well except for the significant pain and arthralgias with the Neulasta injection and he doesn't think that he can continue with the same regimen including the Neulasta injection. I switched his treatment to docetaxel 25 MG/M2 on weekly basis and he is tolerating it much better. He is status post 9 weekly doses. Unfortunately the recent CT scan of the chest, abdomen and pelvis showed evidence for disease progression in the lung and liver.  I recommended for him to discontinue his current treatment with docetaxel.  2) hepatocellular carcinoma diagnosed in August 2017: I had a lengthy discussion with the patient and his wife about the new findings from the ultrasound-guided core biopsy of the liver lesion that showed different malignancy from his initial diagnosis of lung cancer. This is unresectable hepatocellular carcinoma. He  is currently undergoing treatment with Ketruda (pembrolizumab) 200 MG IV every 3 weeks status post 1 cycle. He tolerated the first cycle of his treatment fairly well. I recommended for the patient to proceed with cycle #2 today as a scheduled. He would come back for follow-up visit in 3 weeks with the start of cycle #3.  2) Atrial fibrillation: continue Eliquis.  3) lower extremity edema: Improved. Continue Demadex.  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 I contacted his primary care physician office for management of this condition since they ordered the MRI of the brain. I recommended for the patient to continue his current treatment to his Eliquis and advise him to start taking aspirin daily until he sees a neurologist.  5) CODE STATUS: Previously discussed with the patient and his wife. He would like initial resuscitation but no prolonged support if in vegetative state.  He was advised to call immediately if he has any concerning symptoms in the interval.  The patient voices understanding of current disease status and treatment options and is in agreement with the current care plan.  All questions were answered. The patient knows to call the clinic with any problems, questions or concerns. We can certainly see the patient much sooner if necessary.  Disclaimer: This note was dictated with voice recognition software. Similar sounding words can inadvertently be transcribed and may not be corrected upon review.

## 2016-09-19 NOTE — Patient Instructions (Signed)

## 2016-10-01 ENCOUNTER — Ambulatory Visit (INDEPENDENT_AMBULATORY_CARE_PROVIDER_SITE_OTHER): Payer: Medicare Other

## 2016-10-01 DIAGNOSIS — Z23 Encounter for immunization: Secondary | ICD-10-CM | POA: Diagnosis not present

## 2016-10-10 ENCOUNTER — Encounter: Payer: Self-pay | Admitting: Internal Medicine

## 2016-10-10 ENCOUNTER — Ambulatory Visit (HOSPITAL_BASED_OUTPATIENT_CLINIC_OR_DEPARTMENT_OTHER): Payer: Medicare Other

## 2016-10-10 ENCOUNTER — Ambulatory Visit: Payer: Medicare Other

## 2016-10-10 ENCOUNTER — Ambulatory Visit (HOSPITAL_BASED_OUTPATIENT_CLINIC_OR_DEPARTMENT_OTHER): Payer: Medicare Other | Admitting: Internal Medicine

## 2016-10-10 ENCOUNTER — Other Ambulatory Visit (HOSPITAL_BASED_OUTPATIENT_CLINIC_OR_DEPARTMENT_OTHER): Payer: Medicare Other

## 2016-10-10 ENCOUNTER — Telehealth: Payer: Self-pay | Admitting: Internal Medicine

## 2016-10-10 VITALS — BP 132/74 | HR 74 | Temp 97.5°F | Resp 17 | Ht 69.0 in | Wt 170.8 lb

## 2016-10-10 DIAGNOSIS — I350 Nonrheumatic aortic (valve) stenosis: Secondary | ICD-10-CM

## 2016-10-10 DIAGNOSIS — Z5112 Encounter for antineoplastic immunotherapy: Secondary | ICD-10-CM | POA: Diagnosis present

## 2016-10-10 DIAGNOSIS — R600 Localized edema: Secondary | ICD-10-CM

## 2016-10-10 DIAGNOSIS — I480 Paroxysmal atrial fibrillation: Secondary | ICD-10-CM

## 2016-10-10 DIAGNOSIS — J910 Malignant pleural effusion: Secondary | ICD-10-CM

## 2016-10-10 DIAGNOSIS — M79605 Pain in left leg: Secondary | ICD-10-CM

## 2016-10-10 DIAGNOSIS — M25561 Pain in right knee: Secondary | ICD-10-CM

## 2016-10-10 DIAGNOSIS — C220 Liver cell carcinoma: Secondary | ICD-10-CM

## 2016-10-10 DIAGNOSIS — M791 Myalgia, unspecified site: Secondary | ICD-10-CM

## 2016-10-10 DIAGNOSIS — Z79899 Other long term (current) drug therapy: Secondary | ICD-10-CM

## 2016-10-10 DIAGNOSIS — K769 Liver disease, unspecified: Secondary | ICD-10-CM

## 2016-10-10 DIAGNOSIS — Z95828 Presence of other vascular implants and grafts: Secondary | ICD-10-CM

## 2016-10-10 DIAGNOSIS — R609 Edema, unspecified: Secondary | ICD-10-CM | POA: Diagnosis not present

## 2016-10-10 DIAGNOSIS — Z860100 Personal history of colon polyps, unspecified: Secondary | ICD-10-CM

## 2016-10-10 DIAGNOSIS — J90 Pleural effusion, not elsewhere classified: Secondary | ICD-10-CM

## 2016-10-10 DIAGNOSIS — T380X5A Adverse effect of glucocorticoids and synthetic analogues, initial encounter: Secondary | ICD-10-CM

## 2016-10-10 DIAGNOSIS — E162 Hypoglycemia, unspecified: Secondary | ICD-10-CM

## 2016-10-10 DIAGNOSIS — R634 Abnormal weight loss: Secondary | ICD-10-CM

## 2016-10-10 DIAGNOSIS — B370 Candidal stomatitis: Secondary | ICD-10-CM

## 2016-10-10 DIAGNOSIS — I10 Essential (primary) hypertension: Secondary | ICD-10-CM

## 2016-10-10 DIAGNOSIS — I4891 Unspecified atrial fibrillation: Secondary | ICD-10-CM

## 2016-10-10 DIAGNOSIS — N400 Enlarged prostate without lower urinary tract symptoms: Secondary | ICD-10-CM

## 2016-10-10 DIAGNOSIS — C778 Secondary and unspecified malignant neoplasm of lymph nodes of multiple regions: Secondary | ICD-10-CM | POA: Diagnosis not present

## 2016-10-10 DIAGNOSIS — K209 Esophagitis, unspecified without bleeding: Secondary | ICD-10-CM

## 2016-10-10 DIAGNOSIS — I63522 Cerebral infarction due to unspecified occlusion or stenosis of left anterior cerebral artery: Secondary | ICD-10-CM | POA: Diagnosis not present

## 2016-10-10 DIAGNOSIS — E8809 Other disorders of plasma-protein metabolism, not elsewhere classified: Secondary | ICD-10-CM

## 2016-10-10 DIAGNOSIS — E099 Drug or chemical induced diabetes mellitus without complications: Secondary | ICD-10-CM

## 2016-10-10 DIAGNOSIS — E785 Hyperlipidemia, unspecified: Secondary | ICD-10-CM

## 2016-10-10 DIAGNOSIS — Z0000 Encounter for general adult medical examination without abnormal findings: Secondary | ICD-10-CM

## 2016-10-10 DIAGNOSIS — C3492 Malignant neoplasm of unspecified part of left bronchus or lung: Secondary | ICD-10-CM

## 2016-10-10 DIAGNOSIS — C3411 Malignant neoplasm of upper lobe, right bronchus or lung: Secondary | ICD-10-CM

## 2016-10-10 DIAGNOSIS — R768 Other specified abnormal immunological findings in serum: Secondary | ICD-10-CM

## 2016-10-10 DIAGNOSIS — D61810 Antineoplastic chemotherapy induced pancytopenia: Secondary | ICD-10-CM

## 2016-10-10 DIAGNOSIS — R0609 Other forms of dyspnea: Secondary | ICD-10-CM | POA: Diagnosis not present

## 2016-10-10 DIAGNOSIS — Z87891 Personal history of nicotine dependence: Secondary | ICD-10-CM

## 2016-10-10 DIAGNOSIS — Z789 Other specified health status: Secondary | ICD-10-CM

## 2016-10-10 DIAGNOSIS — J22 Unspecified acute lower respiratory infection: Secondary | ICD-10-CM

## 2016-10-10 DIAGNOSIS — D6481 Anemia due to antineoplastic chemotherapy: Secondary | ICD-10-CM

## 2016-10-10 DIAGNOSIS — R530 Neoplastic (malignant) related fatigue: Secondary | ICD-10-CM

## 2016-10-10 DIAGNOSIS — R21 Rash and other nonspecific skin eruption: Secondary | ICD-10-CM

## 2016-10-10 DIAGNOSIS — E46 Unspecified protein-calorie malnutrition: Secondary | ICD-10-CM

## 2016-10-10 DIAGNOSIS — D751 Secondary polycythemia: Secondary | ICD-10-CM

## 2016-10-10 DIAGNOSIS — D61818 Other pancytopenia: Secondary | ICD-10-CM

## 2016-10-10 DIAGNOSIS — M79604 Pain in right leg: Secondary | ICD-10-CM

## 2016-10-10 DIAGNOSIS — Z7901 Long term (current) use of anticoagulants: Secondary | ICD-10-CM

## 2016-10-10 DIAGNOSIS — Z7189 Other specified counseling: Secondary | ICD-10-CM

## 2016-10-10 DIAGNOSIS — R040 Epistaxis: Secondary | ICD-10-CM

## 2016-10-10 DIAGNOSIS — D696 Thrombocytopenia, unspecified: Secondary | ICD-10-CM

## 2016-10-10 DIAGNOSIS — T451X5A Adverse effect of antineoplastic and immunosuppressive drugs, initial encounter: Secondary | ICD-10-CM

## 2016-10-10 DIAGNOSIS — D702 Other drug-induced agranulocytosis: Secondary | ICD-10-CM

## 2016-10-10 DIAGNOSIS — R011 Cardiac murmur, unspecified: Secondary | ICD-10-CM

## 2016-10-10 DIAGNOSIS — Z5111 Encounter for antineoplastic chemotherapy: Secondary | ICD-10-CM

## 2016-10-10 DIAGNOSIS — E860 Dehydration: Secondary | ICD-10-CM

## 2016-10-10 DIAGNOSIS — J8489 Other specified interstitial pulmonary diseases: Secondary | ICD-10-CM

## 2016-10-10 DIAGNOSIS — I951 Orthostatic hypotension: Secondary | ICD-10-CM

## 2016-10-10 LAB — COMPREHENSIVE METABOLIC PANEL
ALBUMIN: 3.2 g/dL — AB (ref 3.5–5.0)
ALK PHOS: 125 U/L (ref 40–150)
ALT: 33 U/L (ref 0–55)
ANION GAP: 11 meq/L (ref 3–11)
AST: 42 U/L — ABNORMAL HIGH (ref 5–34)
BILIRUBIN TOTAL: 0.43 mg/dL (ref 0.20–1.20)
BUN: 37.5 mg/dL — ABNORMAL HIGH (ref 7.0–26.0)
CALCIUM: 9.1 mg/dL (ref 8.4–10.4)
CO2: 22 mEq/L (ref 22–29)
CREATININE: 1.5 mg/dL — AB (ref 0.7–1.3)
Chloride: 105 mEq/L (ref 98–109)
EGFR: 46 mL/min/{1.73_m2} — AB (ref >90)
Glucose: 260 mg/dl — ABNORMAL HIGH (ref 70–140)
Potassium: 4.6 mEq/L (ref 3.5–5.1)
Sodium: 138 mEq/L (ref 136–145)
TOTAL PROTEIN: 6.6 g/dL (ref 6.4–8.3)

## 2016-10-10 LAB — CBC WITH DIFFERENTIAL/PLATELET
BASO%: 1.2 % (ref 0.0–2.0)
BASOS ABS: 0.1 10*3/uL (ref 0.0–0.1)
EOS%: 2.9 % (ref 0.0–7.0)
Eosinophils Absolute: 0.2 10*3/uL (ref 0.0–0.5)
HEMATOCRIT: 49.4 % (ref 38.4–49.9)
HEMOGLOBIN: 15.8 g/dL (ref 13.0–17.1)
LYMPH#: 0.7 10*3/uL — AB (ref 0.9–3.3)
LYMPH%: 13.3 % — ABNORMAL LOW (ref 14.0–49.0)
MCH: 28.4 pg (ref 27.2–33.4)
MCHC: 31.9 g/dL — AB (ref 32.0–36.0)
MCV: 89.2 fL (ref 79.3–98.0)
MONO#: 0.4 10*3/uL (ref 0.1–0.9)
MONO%: 8.2 % (ref 0.0–14.0)
NEUT#: 4 10*3/uL (ref 1.5–6.5)
NEUT%: 74.4 % (ref 39.0–75.0)
Platelets: 174 10*3/uL (ref 140–400)
RBC: 5.54 10*6/uL (ref 4.20–5.82)
RDW: 15.8 % — AB (ref 11.0–14.6)
WBC: 5.3 10*3/uL (ref 4.0–10.3)

## 2016-10-10 LAB — TSH: TSH: 3.669 m(IU)/L (ref 0.320–4.118)

## 2016-10-10 MED ORDER — SODIUM CHLORIDE 0.9 % IV SOLN
200.0000 mg | Freq: Once | INTRAVENOUS | Status: AC
  Administered 2016-10-10: 200 mg via INTRAVENOUS
  Filled 2016-10-10: qty 8

## 2016-10-10 MED ORDER — SODIUM CHLORIDE 0.9 % IJ SOLN
10.0000 mL | INTRAMUSCULAR | Status: AC | PRN
Start: 2016-10-10 — End: 2016-10-10
  Administered 2016-10-10: 10 mL
  Filled 2016-10-10: qty 10

## 2016-10-10 MED ORDER — HEPARIN SOD (PORK) LOCK FLUSH 100 UNIT/ML IV SOLN
500.0000 [IU] | Freq: Once | INTRAVENOUS | Status: AC | PRN
  Administered 2016-10-10: 500 [IU]
  Filled 2016-10-10: qty 5

## 2016-10-10 MED ORDER — HEPARIN SOD (PORK) LOCK FLUSH 100 UNIT/ML IV SOLN
500.0000 [IU] | INTRAVENOUS | Status: DC | PRN
  Filled 2016-10-10: qty 5

## 2016-10-10 MED ORDER — SODIUM CHLORIDE 0.9 % IV SOLN
Freq: Once | INTRAVENOUS | Status: AC
  Administered 2016-10-10: 13:00:00 via INTRAVENOUS

## 2016-10-10 MED ORDER — SODIUM CHLORIDE 0.9% FLUSH
10.0000 mL | INTRAVENOUS | Status: DC | PRN
  Administered 2016-10-10: 10 mL
  Filled 2016-10-10: qty 10

## 2016-10-10 NOTE — Progress Notes (Signed)
[facility] [phone]   Fax:([phone] [place], [place] [phone]  DIAGNOSIS:  1) Stage IIIB/IV non-small cell lung cancer, adenocarcinoma diagnosed in March of 2015. 2) unresectable hepatocellular carcinoma diagnosed in August 2017  PRIOR THERAPY:  1) Systemic chemotherapy with carboplatin for AUC of 5 and Alimta 500 mg/M2 every 3 weeks. First dose expected on 04/13/2014. Status post 6 cycles. 2)  Maintenance chemotherapy with single agent Alimta 500 mg/M2 every 3 weeks. First cycle on 09/06/2014. Status post 3 cycles. 3) Second course of systemic chemotherapy with carboplatin for AUC of 5 and Alimta 500 MG/M2 every 3 weeks. First cycle 11/08/2014. Status post 6 cycles. 4)  Immunotherapy with Nivolumab 3 MG/KG every 2 weeks. First dose 05/24/2015. Status post 3 cycles. 5) Systemic chemotherapy with docetaxel 60 MG/M2 and Cyramza 10 MG/KG every 3 weeks status post 6 cycles. Last dose was given 11/21/2015 with stable disease. 6) Systemic chemotherapy with docetaxel 75 MG/M2 every 3 weeks. First cycle 05/08/2016. Status post one cycle. Discontinued secondary to intolerance to the Neulasta injection. 7) Systemic chemotherapy with docetaxel 25 MG/M2 every weekly. First cycle 05/29/2016. Status post 9 cycles, last dose was given 07/24/2016. Discontinued secondary to disease progression.  CURRENT THERAPY: Ketruda 200 mg IV every 3 weeks. First dose 08/29/2016. Status post 2 cycles.  INTERVAL HISTORY: Shawn Espinoza 75 y.o. male returns to the clinic today for follow up visit. The patient is feeling fine today with no specific complaints. He is tolerating his treatment with [name] fairly well. He denied having any significant nausea or vomiting. He has no fever or chills. He denied having any significant chest pain but continues to have shortness of breath with exertion with no cough or  hemoptysis. He has no weight loss or night sweats. The patient is here today to start cycle #3 of his immunotherapy with [place].   MEDICAL HISTORY: Past Medical History:  Diagnosis Date  . Acute infarction of intestine, part and extent unspecified ([facility]) 04/25/2016  . 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 Arthritis   . Atrial fibrillation ([place])   . Constipation   . Depression   . Diabetes mellitus type 2 with retinopathy ([facility])   . Diverticulosis of colon   . Dysrhythmia    Atrial fib (found 07/2014)  . Fatty liver 02/29/00   abd ultrasound  . Full code status 09/26/2015  . GERD (gastroesophageal reflux disease)    occasional  . HCAP (healthcare-associated pneumonia) 11/27/2015   IV vanc/zosyn then levaquin courses x2   . Hepatocellular carcinoma ([facility]) 08/21/2016  . History of diabetes mellitus, type II    resolved with diet, h/o neuropathy  . History of tobacco use quit 1990s  . Hyperlipidemia   . Hypertension   . Hypertensive retinopathy of both eyes 2015   mild  . Lobar pneumonia ([facility]) 10/18/2015  . Lower back pain   . 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 Neuropathy ([place])   . Non-small cell carcinoma of left lung ([facility]) 02/2014   stage IIIb/IV on chemo  . Personal history of colonic adenomas 07/06/2013  . Pneumonia   . 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 Shortness of breath   . 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 Systolic murmur 6962   2Decho - normal  LV fxn, EF 55%, mild AS, biatrial enlargement    ALLERGIES:  is allergic to candesartan cilexetil; diltiazem hcl; doxazosin mesylate; nifedipine; pravastatin; red yeast rice; rosuvastatin; sertraline; simvastatin; and hydrocodone.  MEDICATIONS:  Current Outpatient Prescriptions  Medication Sig Dispense Refill  . acetaminophen (TYLENOL) 500 MG tablet Take 500 mg by mouth 2 (two) times daily. Reported on  05/06/2016    . albuterol (PROVENTIL HFA;VENTOLIN HFA) 108 (90 BASE) MCG/ACT inhaler Inhale 2 puffs into the lungs every 6 (six) hours as needed for wheezing or shortness of breath. 1 Inhaler 3  . apixaban (ELIQUIS) 5 MG TABS tablet Take 1 tablet (5 mg total) by mouth 2 (two) times daily. Restart when platelets >50.    . benzonatate (TESSALON) 100 MG capsule Take 1-2 capsules (100-200 mg total) by mouth 3 (three) times daily as needed for cough. 40 capsule 1  . doxycycline (VIBRA-TABS) 100 MG tablet Take 1 tablet (100 mg total) by mouth 2 (two) times daily. 20 tablet 0  . glipiZIDE (GLUCOTROL) 5 MG tablet Take 1 tablet (5 mg total) by mouth as directed. Take one daily when taking steroid (dexamethasone).    Marland Kitchen glucose blood (ONE TOUCH ULTRA TEST) test strip Use to check sugar once daily and as needed. Dx: E09.9 100 each 3  . guaiFENesin (MUCINEX) 600 MG 12 hr tablet Take 600 mg by mouth 2 (two) times daily as needed for cough or to loosen phlegm.     . hydrocortisone (ANUSOL-HC) 25 MG suppository Place 1 suppository (25 mg total) rectally 2 (two) times daily. (Patient taking differently: Place 25 mg rectally 2 (two) times daily as needed for hemorrhoids or itching. ) 12 suppository 0  . KLOR-CON M10 10 MEQ tablet Take 2 tablets (20 mEq total) by mouth every other day. 30 tablet 6  . lidocaine-prilocaine (EMLA) cream Apply 1 application topically as needed. (Patient taking differently: Apply 1 application topically as needed (for port access). ) 30 g 1  . methocarbamol (ROBAXIN) 500 MG tablet Take 500 mg by mouth 3 (three) times daily as needed. Muscle pain    . metoprolol tartrate (LOPRESSOR) 50 MG tablet Take 0.5 tablets (25 mg total) by mouth 2 (two) times daily. 30 tablet 6  . Multiple Vitamins-Minerals (ICAPS) CAPS Take 1 capsule by mouth 2 (two) times daily.     [name] DELICA LANCETS 35H MISC Use to check sugar once daily and as needed. Dx:E09.9 100 each 3  . polyethylene glycol (MIRALAX /  GLYCOLAX) packet Take 8.5 g by mouth daily.     Marland Kitchen spironolactone (ALDACTONE) 50 MG tablet Take 1 tablet (50 mg total) by mouth daily.    . sucralfate (CARAFATE) 1 g tablet Take 1 tablet (1 g total) by mouth 3 (three) times daily before meals. (Patient taking differently: Take 1 g by mouth 3 (three) times daily as needed. ) 90 tablet 1  . torsemide (DEMADEX) 20 MG tablet Take 1 tablet (20 mg total) by mouth every other day. 30 tablet 6  . traMADol (ULTRAM) 50 MG tablet Take 1 tablet (50 mg total) by mouth every 6 (six) hours as needed. (Patient taking differently: Take 50 mg by mouth every 6 (six) hours as needed for moderate pain. ) 30 tablet 3   No current facility-administered medications for this visit.     SURGICAL HISTORY:  Past Surgical History:  Procedure Laterality Date  . CATARACT EXTRACTION Right 03/2015   Dr [name]  . CHEST TUBE INSERTION Left 08/26/2014  Procedure: INSERTION OF LEFT  PLEURAL DRAINAGE CATHETER;  Surgeon: Ivin Poot, MD;  Location: [facility];  Service: Thoracic;  Laterality: Left;  . COLONOSCOPY  2004  . COLONOSCOPY  2014   tubular adenoma x1, mod diverticulosis ([facility])  . FLEXIBLE BRONCHOSCOPY  02/2014   WNL  . PORTACATH PLACEMENT Right 03/31/2015   Procedure: INSERTION PORT-A-CATH;  Surgeon: Ivin Poot, MD;  Location: [facility];  Service: Thoracic;  Laterality: Right;  . REMOVAL OF PLEURAL DRAINAGE CATHETER Left 03/31/2015   Procedure: REMOVAL OF PLEURAL DRAINAGE CATHETER;  Surgeon: Ivin Poot, MD;  Location: [facility];  Service: Thoracic;  Laterality: Left;  Marland Kitchen VIDEO BRONCHOSCOPY Bilateral 03/17/2014   Procedure: VIDEO BRONCHOSCOPY WITH FLUORO;  Surgeon: Tanda Rockers, MD;  Location: [facility];  Service: Cardiopulmonary;  Laterality: Bilateral;    REVIEW OF SYSTEMS:  A comprehensive review of systems was negative except for: Constitutional: positive for fatigue   PHYSICAL EXAMINATION: General appearance: alert, cooperative, fatigued and no distress Head:  Normocephalic, without obvious abnormality, atraumatic Neck: no adenopathy, no JVD, supple, symmetrical, trachea midline and thyroid not enlarged, symmetric, no tenderness/mass/nodules Lymph nodes: Cervical, supraclavicular, and axillary nodes normal. Resp: diminished breath sounds LLL and dullness to percussion LLL Back: symmetric, no curvature. ROM normal. No CVA tenderness. Cardio: regular rate and rhythm, S1, S2 normal, no murmur, click, rub or gallop GI: soft, non-tender; bowel sounds normal; no masses,  no organomegaly Extremities: edema 2+ Neurologic: Alert and oriented X 3, normal strength and tone. Normal symmetric reflexes. Normal coordination and gait  ECOG PERFORMANCE STATUS: 1 - Symptomatic but completely ambulatory  Blood pressure 132/74, pulse 74, temperature 97.5 F (36.4 C), temperature source Oral, resp. rate 17, height '5\' 9"'$  (1.753 m), weight 170 lb 12.8 oz (77.5 kg), SpO2 100 %.  LABORATORY DATA: Lab Results  Component Value Date   WBC 5.3 10/10/2016   HGB 15.8 10/10/2016   HCT 49.4 10/10/2016   MCV 89.2 10/10/2016   PLT 174 10/10/2016      Chemistry      Component Value Date/Time   NA 139 09/19/2016 1115   K 4.8 09/19/2016 1115   CL 103 02/13/2016 1405   CO2 20 (L) 09/19/2016 1115   BUN 31.1 (H) 09/19/2016 1115   CREATININE 1.3 09/19/2016 1115      Component Value Date/Time   CALCIUM 9.2 09/19/2016 1115   ALKPHOS 118 09/19/2016 1115   AST 32 09/19/2016 1115   ALT 35 09/19/2016 1115   BILITOT 0.74 09/19/2016 1115       RADIOGRAPHIC STUDIES: No results found. ASSESSMENT AND PLAN: this is a very pleasant 75 years old white male with:  1) Stage IV non-small cell lung cancer, adenocarcinoma presenting with large right upper lobe lung mass in addition to mediastinal and bilateral hilar lymphadenopathy as well as cervical lymphadenopathy and left pleural effusion. The patient completed a course systemic chemotherapy with carboplatin and Alimta status  post 6 cycles. This was followed by 3 cycles of maintenance chemotherapy with single agent Alimta. The patient tolerated his treatment well. He had evidence for disease progression and the patient was started on treatment again with carboplatin and Alimta status post 6 cycles. He was treated with 4 cycles of immunotherapy with Nivolumab discontinued secondary to disease progression. The patient is currently on systemic chemotherapy with docetaxel and Cyramza status post 6 cycles and tolerated the treatment well except for fatigue and feels admission with recurrent pneumonia.  He has been on observation for  the last 6 months. Unfortunately the recent CT scan of the chest, abdomen and pelvis showed evidence for disease progression with increase in the mediastinal lymphadenopathy as well as increase in size of retrocrural lymph node and gastrohepatic lymphadenopathy. He was started on systemic chemotherapy with docetaxel 75 MG/M2 every 3 weeks with Neulasta support. The patient tolerated the first cycle well except for the significant pain and arthralgias with the Neulasta injection and he doesn't think that he can continue with the same regimen including the Neulasta injection. I switched his treatment to docetaxel 25 MG/M2 on weekly basis and he is tolerating it much better. He is status post 9 weekly doses. Unfortunately the recent CT scan of the chest, abdomen and pelvis showed evidence for disease progression in the lung and liver.  I recommended for him to discontinue his current treatment with docetaxel.  2) hepatocellular carcinoma diagnosed in August 2017: I had a lengthy discussion with the patient and his wife about the new findings from the ultrasound-guided core biopsy of the liver lesion that showed different malignancy from his initial diagnosis of lung cancer. This is unresectable hepatocellular carcinoma. He is currently undergoing treatment with Ketruda (pembrolizumab) 200 MG IV every 3  weeks status post 2 cycles. He tolerated the second cycle of his treatment fairly well. I recommended for the patient to proceed with cycle #3 today as a scheduled. He would come back for follow-up visit in 3 weeks with the start of cycle #4 after repeating CT scan of the chest, abdomen and pelvis for restaging of his disease.  2) Atrial fibrillation: continue Eliquis.  3) lower extremity edema: Improved. Continue Demadex.  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  5) CODE STATUS: Previously discussed with the patient and his wife. He would like initial resuscitation but no prolonged support if in vegetative state.  He was advised to call immediately if he has any concerning symptoms in the interval.  The patient voices understanding of current disease status and treatment options and is in agreement with the current care plan.  All questions were answered. The patient knows to call the clinic with any problems, questions or concerns. We can certainly see the patient much sooner if necessary.  Disclaimer: This note was dictated with voice recognition software. Similar sounding words can inadvertently be transcribed and may not be corrected upon review.

## 2016-10-10 NOTE — Telephone Encounter (Signed)
Gave patient avs report and appointments for November and December. Central radiology will call re scan.  Per patient added flush for port access 11/7 for ct to be scheduled 11/7.

## 2016-10-10 NOTE — Patient Instructions (Signed)
[facility] Discharge Instructions for Patients Receiving Chemotherapy  Today you received the following chemotherapy agents: Keytruda   To help prevent nausea and vomiting after your treatment, we encourage you to take your nausea medication as directed    If you develop nausea and vomiting that is not controlled by your nausea medication, call the clinic.   BELOW ARE SYMPTOMS THAT SHOULD BE REPORTED IMMEDIATELY:  *FEVER GREATER THAN 100.5 F  *CHILLS WITH OR WITHOUT FEVER  NAUSEA AND VOMITING THAT IS NOT CONTROLLED WITH YOUR NAUSEA MEDICATION  *UNUSUAL SHORTNESS OF BREATH  *UNUSUAL BRUISING OR BLEEDING  TENDERNESS IN MOUTH AND THROAT WITH OR WITHOUT PRESENCE OF ULCERS  *URINARY PROBLEMS  *BOWEL PROBLEMS  UNUSUAL RASH Items with * indicate a potential emergency and should be followed up as soon as possible.  Feel free to call the clinic you have any questions or concerns. The clinic phone number is [phone].  Please show the CHEMO ALERT CARD at check-in to the Emergency Department and triage nurse.   

## 2016-10-29 ENCOUNTER — Encounter (HOSPITAL_COMMUNITY): Payer: Self-pay

## 2016-10-29 ENCOUNTER — Ambulatory Visit (HOSPITAL_COMMUNITY)
Admission: RE | Admit: 2016-10-29 | Discharge: 2016-10-29 | Disposition: A | Discharge status: Home / Self Care | Payer: Medicare Other | Source: Ambulatory Visit | Attending: Internal Medicine | Admitting: Internal Medicine

## 2016-10-29 ENCOUNTER — Ambulatory Visit (HOSPITAL_BASED_OUTPATIENT_CLINIC_OR_DEPARTMENT_OTHER): Payer: Medicare Other

## 2016-10-29 DIAGNOSIS — C3492 Malignant neoplasm of unspecified part of left bronchus or lung: Secondary | ICD-10-CM | POA: Insufficient documentation

## 2016-10-29 DIAGNOSIS — C787 Secondary malignant neoplasm of liver and intrahepatic bile duct: Secondary | ICD-10-CM | POA: Diagnosis not present

## 2016-10-29 DIAGNOSIS — C3411 Malignant neoplasm of upper lobe, right bronchus or lung: Secondary | ICD-10-CM | POA: Diagnosis not present

## 2016-10-29 DIAGNOSIS — C778 Secondary and unspecified malignant neoplasm of lymph nodes of multiple regions: Secondary | ICD-10-CM

## 2016-10-29 DIAGNOSIS — Z95828 Presence of other vascular implants and grafts: Secondary | ICD-10-CM

## 2016-10-29 DIAGNOSIS — Z452 Encounter for adjustment and management of vascular access device: Secondary | ICD-10-CM | POA: Diagnosis present

## 2016-10-29 DIAGNOSIS — C220 Liver cell carcinoma: Secondary | ICD-10-CM | POA: Diagnosis not present

## 2016-10-29 DIAGNOSIS — C771 Secondary and unspecified malignant neoplasm of intrathoracic lymph nodes: Secondary | ICD-10-CM | POA: Insufficient documentation

## 2016-10-29 DIAGNOSIS — R590 Localized enlarged lymph nodes: Secondary | ICD-10-CM | POA: Insufficient documentation

## 2016-10-29 DIAGNOSIS — Z5112 Encounter for antineoplastic immunotherapy: Secondary | ICD-10-CM

## 2016-10-29 DIAGNOSIS — C3490 Malignant neoplasm of unspecified part of unspecified bronchus or lung: Secondary | ICD-10-CM | POA: Diagnosis not present

## 2016-10-29 MED ORDER — SODIUM CHLORIDE 0.9 % IJ SOLN
10.0000 mL | INTRAMUSCULAR | Status: AC | PRN
  Administered 2016-10-29: 10 mL
  Filled 2016-10-29: qty 10

## 2016-10-29 MED ORDER — IOPAMIDOL (ISOVUE-300) INJECTION 61%
100.0000 mL | Freq: Once | INTRAVENOUS | Status: AC | PRN
  Administered 2016-10-29: 100 mL via INTRAVENOUS

## 2016-10-31 ENCOUNTER — Other Ambulatory Visit (HOSPITAL_BASED_OUTPATIENT_CLINIC_OR_DEPARTMENT_OTHER): Payer: Medicare Other

## 2016-10-31 ENCOUNTER — Ambulatory Visit (HOSPITAL_BASED_OUTPATIENT_CLINIC_OR_DEPARTMENT_OTHER): Payer: Medicare Other | Admitting: Internal Medicine

## 2016-10-31 ENCOUNTER — Ambulatory Visit (HOSPITAL_BASED_OUTPATIENT_CLINIC_OR_DEPARTMENT_OTHER): Payer: Medicare Other

## 2016-10-31 ENCOUNTER — Other Ambulatory Visit: Payer: Self-pay | Admitting: Family Medicine

## 2016-10-31 ENCOUNTER — Ambulatory Visit: Payer: Medicare Other

## 2016-10-31 ENCOUNTER — Encounter: Payer: Self-pay | Admitting: Internal Medicine

## 2016-10-31 ENCOUNTER — Telehealth: Payer: Self-pay | Admitting: Internal Medicine

## 2016-10-31 VITALS — BP 120/68 | HR 92 | Temp 98.9°F | Resp 18 | Wt 173.3 lb

## 2016-10-31 DIAGNOSIS — C3411 Malignant neoplasm of upper lobe, right bronchus or lung: Secondary | ICD-10-CM

## 2016-10-31 DIAGNOSIS — Z5112 Encounter for antineoplastic immunotherapy: Secondary | ICD-10-CM | POA: Diagnosis present

## 2016-10-31 DIAGNOSIS — C220 Liver cell carcinoma: Secondary | ICD-10-CM

## 2016-10-31 DIAGNOSIS — C3492 Malignant neoplasm of unspecified part of left bronchus or lung: Secondary | ICD-10-CM

## 2016-10-31 DIAGNOSIS — C778 Secondary and unspecified malignant neoplasm of lymph nodes of multiple regions: Secondary | ICD-10-CM

## 2016-10-31 DIAGNOSIS — J910 Malignant pleural effusion: Secondary | ICD-10-CM

## 2016-10-31 DIAGNOSIS — E099 Drug or chemical induced diabetes mellitus without complications: Secondary | ICD-10-CM

## 2016-10-31 DIAGNOSIS — R609 Edema, unspecified: Secondary | ICD-10-CM | POA: Diagnosis not present

## 2016-10-31 DIAGNOSIS — Z79899 Other long term (current) drug therapy: Secondary | ICD-10-CM

## 2016-10-31 DIAGNOSIS — T380X5A Adverse effect of glucocorticoids and synthetic analogues, initial encounter: Secondary | ICD-10-CM

## 2016-10-31 DIAGNOSIS — I63522 Cerebral infarction due to unspecified occlusion or stenosis of left anterior cerebral artery: Secondary | ICD-10-CM

## 2016-10-31 DIAGNOSIS — I4891 Unspecified atrial fibrillation: Secondary | ICD-10-CM | POA: Diagnosis not present

## 2016-10-31 DIAGNOSIS — R530 Neoplastic (malignant) related fatigue: Secondary | ICD-10-CM

## 2016-10-31 DIAGNOSIS — C3491 Malignant neoplasm of unspecified part of right bronchus or lung: Secondary | ICD-10-CM

## 2016-10-31 DIAGNOSIS — Z95828 Presence of other vascular implants and grafts: Secondary | ICD-10-CM

## 2016-10-31 LAB — CBC WITH DIFFERENTIAL/PLATELET
BASO%: 0.7 % (ref 0.0–2.0)
BASOS ABS: 0 10*3/uL (ref 0.0–0.1)
EOS%: 3.1 % (ref 0.0–7.0)
Eosinophils Absolute: 0.2 10*3/uL (ref 0.0–0.5)
HCT: 47.6 % (ref 38.4–49.9)
HGB: 15.3 g/dL (ref 13.0–17.1)
LYMPH%: 14 % (ref 14.0–49.0)
MCH: 28.4 pg (ref 27.2–33.4)
MCHC: 32.1 g/dL (ref 32.0–36.0)
MCV: 88.3 fL (ref 79.3–98.0)
MONO#: 0.4 10*3/uL (ref 0.1–0.9)
MONO%: 6.3 % (ref 0.0–14.0)
NEUT#: 4.2 10*3/uL (ref 1.5–6.5)
NEUT%: 75.9 % — AB (ref 39.0–75.0)
Platelets: 174 10*3/uL (ref 140–400)
RBC: 5.39 10*6/uL (ref 4.20–5.82)
RDW: 15.6 % — ABNORMAL HIGH (ref 11.0–14.6)
WBC: 5.6 10*3/uL (ref 4.0–10.3)
lymph#: 0.8 10*3/uL — ABNORMAL LOW (ref 0.9–3.3)

## 2016-10-31 LAB — COMPREHENSIVE METABOLIC PANEL
ALT: 36 U/L (ref 0–55)
AST: 46 U/L — AB (ref 5–34)
Albumin: 3 g/dL — ABNORMAL LOW (ref 3.5–5.0)
Alkaline Phosphatase: 139 U/L (ref 40–150)
Anion Gap: 11 mEq/L (ref 3–11)
BUN: 32.5 mg/dL — AB (ref 7.0–26.0)
CALCIUM: 9.1 mg/dL (ref 8.4–10.4)
CHLORIDE: 103 meq/L (ref 98–109)
CO2: 20 meq/L — AB (ref 22–29)
CREATININE: 1.3 mg/dL (ref 0.7–1.3)
EGFR: 53 mL/min/{1.73_m2} — ABNORMAL LOW (ref >90)
Glucose: 252 mg/dl — ABNORMAL HIGH (ref 70–140)
POTASSIUM: 5 meq/L (ref 3.5–5.1)
Sodium: 134 mEq/L — ABNORMAL LOW (ref 136–145)
Total Bilirubin: 0.62 mg/dL (ref 0.20–1.20)
Total Protein: 6.5 g/dL (ref 6.4–8.3)

## 2016-10-31 LAB — TSH: TSH: 4.522 m[IU]/L — AB (ref 0.320–4.118)

## 2016-10-31 MED ORDER — SODIUM CHLORIDE 0.9% FLUSH
10.0000 mL | INTRAVENOUS | Status: DC | PRN
  Administered 2016-10-31: 10 mL
  Filled 2016-10-31: qty 10

## 2016-10-31 MED ORDER — HEPARIN SOD (PORK) LOCK FLUSH 100 UNIT/ML IV SOLN
500.0000 [IU] | Freq: Once | INTRAVENOUS | Status: AC | PRN
  Administered 2016-10-31: 500 [IU]
  Filled 2016-10-31: qty 5

## 2016-10-31 MED ORDER — SODIUM CHLORIDE 0.9 % IV SOLN
Freq: Once | INTRAVENOUS | Status: AC
  Administered 2016-10-31: 13:00:00 via INTRAVENOUS

## 2016-10-31 MED ORDER — PEMBROLIZUMAB CHEMO INJECTION 100 MG/4ML
200.0000 mg | Freq: Once | INTRAVENOUS | Status: AC
  Administered 2016-10-31: 200 mg via INTRAVENOUS
  Filled 2016-10-31: qty 8

## 2016-10-31 MED ORDER — SODIUM CHLORIDE 0.9 % IJ SOLN
10.0000 mL | INTRAMUSCULAR | Status: AC | PRN
  Administered 2016-10-31: 10 mL
  Filled 2016-10-31: qty 10

## 2016-10-31 NOTE — Addendum Note (Signed)
Addended by: Curt Bears on: 10/31/2016 12:09 PM   Modules accepted: Orders

## 2016-10-31 NOTE — Progress Notes (Signed)
Shawn Espinoza [phone]   [phone] [place], [place] Shawn Espinoza [id number]  DIAGNOSIS:  1) Stage IIIB/IV non-small cell lung cancer, adenocarcinoma diagnosed in March of 2015. 2) unresectable hepatocellular carcinoma diagnosed in August 2017  PRIOR THERAPY:  1) Systemic chemotherapy with carboplatin for AUC of 5 and Alimta 500 mg/M2 every 3 weeks. First dose expected on 04/13/2014. Status post 6 cycles. 2)  Maintenance chemotherapy with single agent Alimta 500 mg/M2 every 3 weeks. First cycle on 09/06/2014. Status post 3 cycles. 3) Second course of systemic chemotherapy with carboplatin for AUC of 5 and Alimta 500 MG/M2 every 3 weeks. First cycle 11/08/2014. Status post 6 cycles. 4)  Immunotherapy with Nivolumab 3 MG/KG every 2 weeks. First dose 05/24/2015. Status post 3 cycles. 5) Systemic chemotherapy with docetaxel 60 MG/M2 and Cyramza 10 MG/KG every 3 weeks status post 6 cycles. Last dose was given 11/21/2015 with stable disease. 6) Systemic chemotherapy with docetaxel 75 MG/M2 every 3 weeks. First cycle 05/08/2016. Status post one cycle. Discontinued secondary to intolerance to the Neulasta injection. 7) Systemic chemotherapy with docetaxel 25 MG/M2 every weekly. First cycle 05/29/2016. Status post 9 cycles, last dose was given 07/24/2016. Discontinued secondary to disease progression.  CURRENT THERAPY: Ketruda 200 mg IV every 3 weeks. First dose 08/29/2016. Status post 3 cycles.  INTERVAL HISTORY: Shawn Espinoza 75 y.o. male returns to the clinic today for follow up visit accompanied by his wife. The patient is feeling fine today with no specific complaints. He is tolerating his treatment with [name] fairly well status post 3 cycles. He denied having any significant nausea or vomiting. He has no fever or chills. He denied having any significant chest pain but continues to have shortness of  breath with exertion with no cough or hemoptysis. He has no weight loss or night sweats. He had repeat CT scan of the chest, abdomen and pelvis performed recently and he is here for evaluation and discussion of his scan results.  MEDICAL HISTORY: Past Medical History:  Diagnosis Date  . Acute infarction of intestine, part and extent unspecified (Shawn Espinoza) 04/25/2016  . 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 Arthritis   . Atrial fibrillation ([place])   . Constipation   . Depression   . Diabetes mellitus type 2 with retinopathy ([place])   . Diverticulosis of colon   . Dysrhythmia    Atrial fib (found 07/2014)  . Fatty liver 02/29/00   abd ultrasound  . Full code status 09/26/2015  . GERD (gastroesophageal reflux disease)    occasional  . HCAP (healthcare-associated pneumonia) 11/27/2015   IV vanc/zosyn then levaquin courses x2   . Hepatocellular carcinoma (Shawn Espinoza) 08/21/2016  . History of diabetes mellitus, type II    resolved with diet, h/o neuropathy  . History of tobacco use quit 1990s  . Hyperlipidemia   . Hypertension   . Hypertensive retinopathy of both eyes 2015   mild  . Lobar pneumonia (Shawn Espinoza) 10/18/2015  . Lower back pain   . 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 Neuropathy (Shawn Espinoza)   . Non-small cell carcinoma of left lung (Shawn Espinoza) 02/2014   stage IIIb/IV on chemo  . Personal history of colonic adenomas 07/06/2013  . Pneumonia   . 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 Shortness of  breath   . 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 Systolic murmur 4010   2Decho - normal LV fxn, EF 55%, mild AS, biatrial enlargement    ALLERGIES:  is allergic to candesartan cilexetil; diltiazem hcl; doxazosin mesylate; nifedipine; pravastatin; red yeast rice; rosuvastatin; sertraline; simvastatin; and hydrocodone.  MEDICATIONS:  Current Outpatient Prescriptions  Medication Sig Dispense Refill  .  acetaminophen (TYLENOL) 500 MG tablet Take 500 mg by mouth 2 (two) times daily. Reported on 05/06/2016    . albuterol (PROVENTIL HFA;VENTOLIN HFA) 108 (90 BASE) MCG/ACT inhaler Inhale 2 puffs into the lungs every 6 (six) hours as needed for wheezing or shortness of breath. 1 Inhaler 3  . apixaban (ELIQUIS) 5 MG TABS tablet Take 1 tablet (5 mg total) by mouth 2 (two) times daily. Restart when platelets >50.    . benzonatate (TESSALON) 100 MG capsule Take 1-2 capsules (100-200 mg total) by mouth 3 (three) times daily as needed for cough. 40 capsule 1  . doxycycline (VIBRA-TABS) 100 MG tablet Take 1 tablet (100 mg total) by mouth 2 (two) times daily. 20 tablet 0  . glipiZIDE (GLUCOTROL) 5 MG tablet Take 1 tablet (5 mg total) by mouth as directed. Take one daily when taking steroid (dexamethasone).    Marland Kitchen glucose blood (ONE TOUCH ULTRA TEST) test strip Use to check sugar once daily and as needed. Dx: E09.9 100 each 3  . guaiFENesin (MUCINEX) 600 MG 12 hr tablet Take 600 mg by mouth 2 (two) times daily as needed for cough or to loosen phlegm.     . hydrocortisone (ANUSOL-HC) 25 MG suppository Place 1 suppository (25 mg total) rectally 2 (two) times daily. (Patient taking differently: Place 25 mg rectally 2 (two) times daily as needed for hemorrhoids or itching. ) 12 suppository 0  . KLOR-CON M10 10 MEQ tablet Take 2 tablets (20 mEq total) by mouth every other day. 30 tablet 6  . lidocaine-prilocaine (EMLA) cream Apply 1 application topically as needed. (Patient taking differently: Apply 1 application topically as needed (for port access). ) 30 g 1  . methocarbamol (ROBAXIN) 500 MG tablet Take 500 mg by mouth 3 (three) times daily as needed. Muscle pain    . metoprolol tartrate (LOPRESSOR) 50 MG tablet Take 0.5 tablets (25 mg total) by mouth 2 (two) times daily. 30 tablet 6  . Multiple Vitamins-Minerals (ICAPS) CAPS Take 1 capsule by mouth 2 (two) times daily.     [name] DELICA LANCETS 27O MISC Use to check  sugar once daily and as needed. Dx:E09.9 100 each 3  . polyethylene glycol (MIRALAX / GLYCOLAX) packet Take 8.5 g by mouth daily.     Marland Kitchen spironolactone (ALDACTONE) 50 MG tablet Take 1 tablet (50 mg total) by mouth daily.    . sucralfate (CARAFATE) 1 g tablet Take 1 tablet (1 g total) by mouth 3 (three) times daily before meals. (Patient taking differently: Take 1 g by mouth 3 (three) times daily as needed. ) 90 tablet 1  . torsemide (DEMADEX) 20 MG tablet Take 1 tablet (20 mg total) by mouth every other day. 30 tablet 6  . traMADol (ULTRAM) 50 MG tablet Take 1 tablet (50 mg total) by mouth every 6 (six) hours as needed. (Patient taking differently: Take 50 mg by mouth every 6 (six) hours as needed for moderate pain. ) 30 tablet 3   No current facility-administered medications for this visit.     SURGICAL HISTORY:  Past Surgical History:  Procedure Laterality Date  .  CATARACT EXTRACTION Right 03/2015   Dr Shawn Espinoza  . CHEST TUBE INSERTION Left 08/26/2014   Procedure: INSERTION OF LEFT  PLEURAL DRAINAGE CATHETER;  Surgeon: Ivin Poot, MD;  Location: [place];  Service: Thoracic;  Laterality: Left;  . COLONOSCOPY  2004  . COLONOSCOPY  2014   tubular adenoma x1, mod diverticulosis ([place])  . FLEXIBLE BRONCHOSCOPY  02/2014   WNL  . PORTACATH PLACEMENT Right 03/31/2015   Procedure: INSERTION PORT-A-CATH;  Surgeon: Ivin Poot, MD;  Location: [place];  Service: Thoracic;  Laterality: Right;  . REMOVAL OF PLEURAL DRAINAGE CATHETER Left 03/31/2015   Procedure: REMOVAL OF PLEURAL DRAINAGE CATHETER;  Surgeon: Ivin Poot, MD;  Location: [place];  Service: Thoracic;  Laterality: Left;  Marland Kitchen VIDEO BRONCHOSCOPY Bilateral 03/17/2014   Procedure: VIDEO BRONCHOSCOPY WITH FLUORO;  Surgeon: Tanda Rockers, MD;  Location: [facility];  Service: Cardiopulmonary;  Laterality: Bilateral;    REVIEW OF SYSTEMS:  Constitutional: positive for fatigue Eyes: negative Ears, nose, mouth, throat, and face:  negative Respiratory: negative Cardiovascular: negative Gastrointestinal: negative Genitourinary:negative Integument/breast: negative Hematologic/lymphatic: negative Musculoskeletal:negative Neurological: negative Behavioral/Psych: negative Endocrine: negative Allergic/Immunologic: negative   PHYSICAL EXAMINATION: General appearance: alert, cooperative, fatigued and no distress Head: Normocephalic, without obvious abnormality, atraumatic Neck: no adenopathy, no JVD, supple, symmetrical, trachea midline and thyroid not enlarged, symmetric, no tenderness/mass/nodules Lymph nodes: Cervical, supraclavicular, and axillary nodes normal. Resp: diminished breath sounds LLL and dullness to percussion LLL Back: symmetric, no curvature. ROM normal. No CVA tenderness. Cardio: regular rate and rhythm, S1, S2 normal, no murmur, click, rub or gallop GI: soft, non-tender; bowel sounds normal; no masses,  no organomegaly Extremities: edema 2+ Neurologic: Alert and oriented X 3, normal strength and tone. Normal symmetric reflexes. Normal coordination and gait  ECOG PERFORMANCE STATUS: 1 - Symptomatic but completely ambulatory  Blood pressure 120/68, pulse 92, temperature 98.9 F (37.2 C), temperature source Oral, resp. rate 18, weight 173 lb 4.8 oz (78.6 kg), SpO2 98 %.  LABORATORY DATA: Lab Results  Component Value Date   WBC 5.6 10/31/2016   HGB 15.3 10/31/2016   HCT 47.6 10/31/2016   MCV 88.3 10/31/2016   PLT 174 10/31/2016      Chemistry      Component Value Date/Time   NA 134 (L) 10/31/2016 0942   K 5.0 10/31/2016 0942   CL 103 02/13/2016 1405   CO2 20 (L) 10/31/2016 0942   BUN 32.5 (H) 10/31/2016 0942   CREATININE 1.3 10/31/2016 0942      Component Value Date/Time   CALCIUM 9.1 10/31/2016 0942   ALKPHOS 139 10/31/2016 0942   AST 46 (H) 10/31/2016 0942   ALT 36 10/31/2016 0942   BILITOT 0.62 10/31/2016 0942       RADIOGRAPHIC STUDIES: Ct Chest W Contrast  Result Date:  10/29/2016 CLINICAL DATA:  Metastatic lung cancer with liver metastases. EXAM: CT CHEST, ABDOMEN, AND PELVIS WITH CONTRAST TECHNIQUE: Multidetector CT imaging of the chest, abdomen and pelvis was performed following the standard protocol during bolus administration of intravenous contrast. CONTRAST:  113m ISOVUE-300 IOPAMIDOL (ISOVUE-300) INJECTION 61% COMPARISON:  07/29/2016 FINDINGS: CT CHEST FINDINGS Cardiovascular: Heart size appears normal. No pericardial effusion. Aortic atherosclerosis. Calcification within the LAD coronary artery noted. Mediastinum/Nodes: The trachea appears patent and is midline. Normal appearance of the esophagus. Pre-vascular node measures 1.3 cm, image 22 of series 2. Previously 2.0 cm. Right hilar node measures 2.1 x 1.9 cm, image 28 of series 2. Previously 3.6 x 3.2 cm. Lungs/Pleura:  No pleural fluid. Moderate changes of centrilobular emphysema. The triangular-shaped density within the periphery of the left lower lobe measures 1 x 0.6 cm, image 83 of series 5. Previously 0.7 x 0.6 cm. Stable scarring and fibrosis involving the superior segment of left lower lobe. Musculoskeletal: There is a small subpleural nodule within the left lower lobe measuring 4 mm, image number 95 of series 5. Unchanged from previous exam. CT ABDOMEN PELVIS FINDINGS Hepatobiliary: Large confluent mass involving the right lobe of liver is again noted and appears ill defined measuring 10.9 x 17.3 by 18.2 cm. Previously this measured 9.5 x 16.1 by 9.2 cm. Index lesion in the lateral segment of left lobe measures 2.4 by 1.5 cm. New from previous exam. Stones and/or sludge noted within the dependent portion of the gallbladder. Pancreas: Unremarkable. No pancreatic ductal dilatation or surrounding inflammatory changes. Spleen: Normal in size without focal abnormality. Adrenals/Urinary Tract: Adrenal glands are unremarkable. Kidneys are normal, without renal calculi, focal lesion, or hydronephrosis. Bladder is  unremarkable. Stomach/Bowel: The stomach appears normal. The small bowel loops have a normal course and caliber. The appendix is visualized and appears normal. Unremarkable appearance of the colon. Vascular/Lymphatic: Aortic atherosclerosis. No aneurysm. Portacaval lymph node measures 1.7 cm, image 66 of series 2. On the previous exam this measured 1 cm. Reproductive: Prostate gland appears enlarged. Other: There is no ascites or focal fluid collections within the abdomen or pelvis. Musculoskeletal: No aggressive lytic or sclerotic bone lesions. IMPRESSION: 1. Interval mixed response to therapy. 2. There has been decrease in size of anterior mediastinal and right hilar lymph node metastases. 3. Progression of multifocal liver metastasis with new lesion in the lateral segment of left lobe. 4. Interval enlargement of portacaval lymph node compatible with metastatic adenopathy. Electronically Signed   By: Kerby Moors M.D.   On: 10/29/2016 16:08   Ct Abdomen Pelvis W Contrast  Result Date: 10/29/2016 CLINICAL DATA:  Metastatic lung cancer with liver metastases. EXAM: CT CHEST, ABDOMEN, AND PELVIS WITH CONTRAST TECHNIQUE: Multidetector CT imaging of the chest, abdomen and pelvis was performed following the standard protocol during bolus administration of intravenous contrast. CONTRAST:  123m ISOVUE-300 IOPAMIDOL (ISOVUE-300) INJECTION 61% COMPARISON:  07/29/2016 FINDINGS: CT CHEST FINDINGS Cardiovascular: Heart size appears normal. No pericardial effusion. Aortic atherosclerosis. Calcification within the LAD coronary artery noted. Mediastinum/Nodes: The trachea appears patent and is midline. Normal appearance of the esophagus. Pre-vascular node measures 1.3 cm, image 22 of series 2. Previously 2.0 cm. Right hilar node measures 2.1 x 1.9 cm, image 28 of series 2. Previously 3.6 x 3.2 cm. Lungs/Pleura: No pleural fluid. Moderate changes of centrilobular emphysema. The triangular-shaped density within the periphery  of the left lower lobe measures 1 x 0.6 cm, image 83 of series 5. Previously 0.7 x 0.6 cm. Stable scarring and fibrosis involving the superior segment of left lower lobe. Musculoskeletal: There is a small subpleural nodule within the left lower lobe measuring 4 mm, image number 95 of series 5. Unchanged from previous exam. CT ABDOMEN PELVIS FINDINGS Hepatobiliary: Large confluent mass involving the right lobe of liver is again noted and appears ill defined measuring 10.9 x 17.3 by 18.2 cm. Previously this measured 9.5 x 16.1 by 9.2 cm. Index lesion in the lateral segment of left lobe measures 2.4 by 1.5 cm. New from previous exam. Stones and/or sludge noted within the dependent portion of the gallbladder. Pancreas: Unremarkable. No pancreatic ductal dilatation or surrounding inflammatory changes. Spleen: Normal in size without focal abnormality. Adrenals/Urinary Tract:  Adrenal glands are unremarkable. Kidneys are normal, without renal calculi, focal lesion, or hydronephrosis. Bladder is unremarkable. Stomach/Bowel: The stomach appears normal. The small bowel loops have a normal course and caliber. The appendix is visualized and appears normal. Unremarkable appearance of the colon. Vascular/Lymphatic: Aortic atherosclerosis. No aneurysm. Portacaval lymph node measures 1.7 cm, image 66 of series 2. On the previous exam this measured 1 cm. Reproductive: Prostate gland appears enlarged. Other: There is no ascites or focal fluid collections within the abdomen or pelvis. Musculoskeletal: No aggressive lytic or sclerotic bone lesions. IMPRESSION: 1. Interval mixed response to therapy. 2. There has been decrease in size of anterior mediastinal and right hilar lymph node metastases. 3. Progression of multifocal liver metastasis with new lesion in the lateral segment of left lobe. 4. Interval enlargement of portacaval lymph node compatible with metastatic adenopathy. Electronically Signed   By: Kerby Moors M.D.   On:  10/29/2016 16:08   ASSESSMENT AND PLAN: this is a very pleasant 75 years old white male with:  1) Stage IV non-small cell lung cancer, adenocarcinoma presenting with large right upper lobe lung mass in addition to mediastinal and bilateral hilar lymphadenopathy as well as cervical lymphadenopathy and left pleural effusion. The patient completed a course systemic chemotherapy with carboplatin and Alimta status post 6 cycles. This was followed by 3 cycles of maintenance chemotherapy with single agent Alimta. The patient tolerated his treatment well. He had evidence for disease progression and the patient was started on treatment again with carboplatin and Alimta status post 6 cycles. He was treated with 4 cycles of immunotherapy with Nivolumab discontinued secondary to disease progression. The patient is currently on systemic chemotherapy with docetaxel and Cyramza status post 6 cycles and tolerated the treatment well except for fatigue and feels admission with recurrent pneumonia.  He has been on observation for the last 6 months. Unfortunately the recent CT scan of the chest, abdomen and pelvis showed evidence for disease progression with increase in the mediastinal lymphadenopathy as well as increase in size of retrocrural lymph node and gastrohepatic lymphadenopathy. He was started on systemic chemotherapy with docetaxel 75 MG/M2 every 3 weeks with Neulasta support. The patient tolerated the first cycle well except for the significant pain and arthralgias with the Neulasta injection and he doesn't think that he can continue with the same regimen including the Neulasta injection. I switched his treatment to docetaxel 25 MG/M2 on weekly basis and he is tolerating it much better. He is status post 9 weekly doses. Unfortunately the recent CT scan of the chest, abdomen and pelvis showed evidence for disease progression in the lung and liver.  I recommended for him to discontinue his current treatment with  docetaxel. He is currently on treatment with Ketruda (pembrolizumab) and the recent CT scan showed improvement of his disease in the mediastinal lymph nodes.  2) hepatocellular carcinoma diagnosed in August 2017: I had a lengthy discussion with the patient and his wife about the new findings from the ultrasound-guided core biopsy of the liver lesion that showed different malignancy from his initial diagnosis of lung cancer. This is unresectable hepatocellular carcinoma. He is currently undergoing treatment with Ketruda (pembrolizumab) 200 MG IV every 3 weeks status post 3 cycles. He tolerated the third cycle of his treatment fairly well. The recent CT scan of the chest, abdomen and pelvis showed improvement of his disease in the chest but there was mild increase in the liver lesions. I discussed the scan results with the patient and  his wife. I recommended for him to continue his current treatment with [place] (pembrolizumab). I will also contact interventional radiology to see if the patient will be a candidate for Y90 radial embolization therapy. I recommended for the patient to proceed with cycle #4 today as a scheduled. He would come back for follow-up visit in 3 weeks with the start of cycle #5.   2) Atrial fibrillation: continue Eliquis.  3) lower extremity edema: Improved. Continue Demadex.  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  5) CODE STATUS: Previously discussed with the patient and his wife. He would like initial resuscitation but no prolonged support if in vegetative state.  He was advised to call immediately if he has any concerning symptoms in the interval.  The patient voices understanding of current disease status and treatment options and is in agreement with the current care plan.  All questions were answered. The patient knows to call the clinic with any problems, questions or concerns. We can certainly see  the patient much sooner if necessary.  Disclaimer: This note was dictated with voice recognition software. Similar sounding words can inadvertently be transcribed and may not be corrected upon review.

## 2016-10-31 NOTE — Patient Instructions (Signed)
[facility] Discharge Instructions for Patients Receiving Chemotherapy  Today you received the following chemotherapy agents:  Keytruda.  To help prevent nausea and vomiting after your treatment, we encourage you to take your nausea medication as prescribed.   If you develop nausea and vomiting that is not controlled by your nausea medication, call the clinic.   BELOW ARE SYMPTOMS THAT SHOULD BE REPORTED IMMEDIATELY:  *FEVER GREATER THAN 100.5 F  *CHILLS WITH OR WITHOUT FEVER  NAUSEA AND VOMITING THAT IS NOT CONTROLLED WITH YOUR NAUSEA MEDICATION  *UNUSUAL SHORTNESS OF BREATH  *UNUSUAL BRUISING OR BLEEDING  TENDERNESS IN MOUTH AND THROAT WITH OR WITHOUT PRESENCE OF ULCERS  *URINARY PROBLEMS  *BOWEL PROBLEMS  UNUSUAL RASH Items with * indicate a potential emergency and should be followed up as soon as possible.  Feel free to call the clinic you have any questions or concerns. The clinic phone number is [phone].  Please show the CHEMO ALERT CARD at check-in to the Emergency Department and triage nurse.   

## 2016-10-31 NOTE — Telephone Encounter (Signed)
Appointments scheduled per 11/9 LOS. Patient given AVS report and calendars with future scheduled appointments. °

## 2016-11-07 ENCOUNTER — Ambulatory Visit (INDEPENDENT_AMBULATORY_CARE_PROVIDER_SITE_OTHER): Payer: Medicare Other | Admitting: Family Medicine

## 2016-11-07 ENCOUNTER — Encounter: Payer: Self-pay | Admitting: Family Medicine

## 2016-11-07 VITALS — BP 130/78 | HR 84 | Temp 97.7°F | Wt 172.2 lb

## 2016-11-07 DIAGNOSIS — T380X5A Adverse effect of glucocorticoids and synthetic analogues, initial encounter: Secondary | ICD-10-CM

## 2016-11-07 DIAGNOSIS — E118 Type 2 diabetes mellitus with unspecified complications: Secondary | ICD-10-CM

## 2016-11-07 DIAGNOSIS — I63422 Cerebral infarction due to embolism of left anterior cerebral artery: Secondary | ICD-10-CM | POA: Diagnosis not present

## 2016-11-07 DIAGNOSIS — R609 Edema, unspecified: Secondary | ICD-10-CM | POA: Diagnosis not present

## 2016-11-07 DIAGNOSIS — C3492 Malignant neoplasm of unspecified part of left bronchus or lung: Secondary | ICD-10-CM

## 2016-11-07 DIAGNOSIS — R600 Localized edema: Secondary | ICD-10-CM

## 2016-11-07 DIAGNOSIS — C220 Liver cell carcinoma: Secondary | ICD-10-CM | POA: Diagnosis not present

## 2016-11-07 DIAGNOSIS — E099 Drug or chemical induced diabetes mellitus without complications: Secondary | ICD-10-CM

## 2016-11-07 LAB — LDL CHOLESTEROL, DIRECT: Direct LDL: 385 mg/dL

## 2016-11-07 LAB — HEMOGLOBIN A1C: Hgb A1c MFr Bld: 7 % — ABNORMAL HIGH (ref 4.6–6.5)

## 2016-11-07 NOTE — Progress Notes (Signed)
Pre visit review using our clinic review tool, if applicable. No additional management support is needed unless otherwise documented below in the visit note. 

## 2016-11-07 NOTE — Patient Instructions (Addendum)
Labs today to check sugar control.  Good to see you today, return as needed or in 4 months for wellness visit

## 2016-11-07 NOTE — Progress Notes (Addendum)
BP 130/78   Pulse 84   Temp 97.7 F (36.5 C) (Oral)   Wt 172 lb 4 oz (78.1 kg)   BMI 25.44 kg/m    CC: 51mof/u visit Subjective:    Patient ID: Shawn Espinoza male    DOB: 127-Jul-1942 75y.o.   MRN: [id number] HPI: VMUZAMMIL BRUINSis a 75y.o. male presenting on 11/07/2016 for Follow-up   Latest onc notes reviewed. [facility] with [facility] known non small cell lung cancer since 02/2014. TDaun Peacockwith improvement on latest lung CT.   Needs diabetic foot exam today for steroid-induced diabetes.  Sugars checked a few times a week - ranging 140-250s.  Off steroids at this time.  Diabetic Foot Exam - Simple   Simple Foot Form Diabetic Foot exam was performed with the following findings:  Yes 11/07/2016 12:59 PM  Visual Inspection See comments:  Yes Sensation Testing See comments:  Yes Pulse Check See comments:  Yes Comments Some decrease sensation to monofilament bilaterally Maceration of skin between toes Slightly diminished DP pulses     Relevant past medical, surgical, family and social history reviewed and updated as indicated. Interim medical history since our last visit reviewed. Allergies and medications reviewed and updated. Current Outpatient Prescriptions on File Prior to Visit  Medication Sig  . acetaminophen (TYLENOL) 500 MG tablet Take 500 mg by mouth 2 (two) times daily. Reported on 05/06/2016  . albuterol (PROVENTIL HFA;VENTOLIN HFA) 108 (90 BASE) MCG/ACT inhaler Inhale 2 puffs into the lungs every 6 (six) hours as needed for wheezing or shortness of breath.  . benzonatate (TESSALON) 100 MG capsule Take 1-2 capsules (100-200 mg total) by mouth 3 (three) times daily as needed for cough.  .Marland KitchenELIQUIS 5 MG TABS tablet TAKE ONE TABLET BY MOUTH TWICE DAILY  . glipiZIDE (GLUCOTROL) 5 MG tablet Take 1 tablet (5 mg total) by mouth as directed. Take one daily when taking steroid (dexamethasone).  .Marland Kitchenglucose blood  (ONE TOUCH ULTRA TEST) test strip Use to check sugar once daily and as needed. 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 guaiFENesin (MUCINEX) 600 MG 12 hr tablet Take 600 mg by mouth 2 (two) times daily as needed for cough or to loosen phlegm.   . hydrocortisone (ANUSOL-HC) 25 MG suppository Place 1 suppository (25 mg total) rectally 2 (two) times daily. (Patient taking differently: Place 25 mg rectally 2 (two) times daily as needed for hemorrhoids or itching. )  . KLOR-CON M10 10 MEQ tablet Take 2 tablets (20 mEq total) by mouth every other day.  . lidocaine-prilocaine (EMLA) cream Apply 1 application topically as needed. (Patient taking differently: Apply 1 application topically as needed (for port access). )  . methocarbamol (ROBAXIN) 500 MG tablet Take 500 mg by mouth 3 (three) times daily as needed. Muscle pain  . metoprolol tartrate (LOPRESSOR) 50 MG tablet Take 0.5 tablets (25 mg total) by mouth 2 (two) times daily.  . Multiple Vitamins-Minerals (ICAPS) CAPS Take 1 capsule by mouth 2 (two) times daily.   .Glory RosebushDELICA LANCETS 395JMISC Use to check sugar once daily and as needed. Dx:E09.9  . polyethylene glycol (MIRALAX / GLYCOLAX) packet Take 8.5 g by mouth daily.   .Marland Kitchenspironolactone (ALDACTONE) 50 MG tablet Take 1 tablet (50 mg total) by mouth daily.  . sucralfate (CARAFATE) 1 g tablet Take 1 tablet (1 g total) by mouth 3 (three) times daily before meals. (Patient taking differently: Take 1  g by mouth 3 (three) times daily as needed. )  . torsemide (DEMADEX) 20 MG tablet Take 1 tablet (20 mg total) by mouth every other day.  . traMADol (ULTRAM) 50 MG tablet Take 1 tablet (50 mg total) by mouth every 6 (six) hours as needed. (Patient taking differently: Take 50 mg by mouth every 6 (six) hours as needed for moderate pain. )   No current facility-administered medications on file prior to visit.     Review of Systems Per HPI unless specifically indicated in ROS section     Objective:    BP 130/78   Pulse 84    Temp 97.7 F (36.5 C) (Oral)   Wt 172 lb 4 oz (78.1 kg)   BMI 25.44 kg/m   Wt Readings from Last 3 Encounters:  11/07/16 172 lb 4 oz (78.1 kg)  10/31/16 173 lb 4.8 oz (78.6 kg)  10/10/16 170 lb 12.8 oz (77.5 kg)    Physical Exam  Constitutional: He appears well-developed and well-nourished. No distress.  HENT:  Head: Normocephalic and atraumatic.  Mouth/Throat: Oropharynx is clear and moist. No oropharyngeal exudate.  Eyes: Conjunctivae and EOM are normal. Pupils are equal, round, and reactive to light. No scleral icterus.  Neck: Normal range of motion. Neck supple.  Cardiovascular: Normal rate, regular rhythm, normal heart sounds and intact distal pulses.   No murmur heard. Pulmonary/Chest: Effort normal and breath sounds normal. No respiratory distress. He has no wheezes. He has no rales.  Musculoskeletal: He exhibits no edema.  See HPI for foot exam if done  Lymphadenopathy:    He has no cervical adenopathy.  Skin: Skin is warm and dry. No rash noted.  Psychiatric: He has a normal mood and affect.  Nursing note and vitals reviewed.  Results for orders placed or performed in visit on 11/07/16  Hemoglobin A1c  Result Value Ref Range   Hgb A1c MFr Bld 7.0 (H) 4.6 - 6.5 %  LDL Cholesterol, Direct  Result Value Ref Range   Direct LDL 385.0 mg/dL   *Note: Due to a large number of results and/or encounters for the requested time period, some results have not been displayed. A complete set of results can be found in Results Review.      Assessment & Plan:   Problem List Items Addressed This Visit    Adenocarcinoma of left lung, stage 4 ([place])    Appreciate onc care.       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 Update A1c then determine need for antihyperglycemic.       Relevant Orders   Hemoglobin A1c (Completed)   LDL Cholesterol, Direct (Completed)   Fructosamine   Hepatocellular carcinoma ([place])     Recent dx - appreciate onc care.       Peripheral edema    Continue spironolactone daily, torsemide QOD dosing.       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 See above.           Follow up plan: Return in about 4 months (around 03/07/2017) for medicare wellness visit.  [name], MD

## 2016-11-08 NOTE — Assessment & Plan Note (Signed)
Persistent hyperglycemia despite stopping steroid. See above.

## 2016-11-08 NOTE — Assessment & Plan Note (Signed)
Continue spironolactone daily, torsemide QOD dosing.

## 2016-11-08 NOTE — Assessment & Plan Note (Signed)
Pt endorses elevated readings. Update A1c then determine need for antihyperglycemic.

## 2016-11-08 NOTE — Assessment & Plan Note (Signed)
Recent dx - appreciate onc care.

## 2016-11-08 NOTE — Assessment & Plan Note (Signed)
Appreciate onc care.  

## 2016-11-11 LAB — FRUCTOSAMINE: FRUCTOSAMINE: 307 umol/L — AB (ref 190–270)

## 2016-11-12 ENCOUNTER — Ambulatory Visit
Admission: RE | Admit: 2016-11-12 | Discharge: 2016-11-12 | Disposition: A | Discharge status: Home / Self Care | Payer: Medicare Other | Source: Ambulatory Visit | Attending: Internal Medicine | Admitting: Internal Medicine

## 2016-11-12 DIAGNOSIS — E099 Drug or chemical induced diabetes mellitus without complications: Secondary | ICD-10-CM

## 2016-11-12 DIAGNOSIS — C220 Liver cell carcinoma: Secondary | ICD-10-CM

## 2016-11-12 DIAGNOSIS — C3492 Malignant neoplasm of unspecified part of left bronchus or lung: Secondary | ICD-10-CM

## 2016-11-12 DIAGNOSIS — T380X5A Adverse effect of glucocorticoids and synthetic analogues, initial encounter: Secondary | ICD-10-CM

## 2016-11-12 DIAGNOSIS — C801 Malignant (primary) neoplasm, unspecified: Secondary | ICD-10-CM | POA: Diagnosis not present

## 2016-11-12 DIAGNOSIS — Z5112 Encounter for antineoplastic immunotherapy: Secondary | ICD-10-CM

## 2016-11-12 NOTE — Consult Note (Signed)
Chief Complaint: Patient was seen in consultation today for [facility] at the request of [facility]  Referring Physician(s): Mohamed,Mohamed  History of Present Illness: Shawn Espinoza is a 75 y.o. 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 At the time of diagnosis there was a solitary liver lesion which was initially thought to be metastatic lung adenocarcinoma.  The liver lesion demonstrated significant and the rapid progression into multifocal disease between March and August 2017. Given the relatively rapid change, ultrasound-guided biopsy was performed and came back as hepatocellular carcinoma.   This gentleman presents today to discuss targeted liver therapy. He is not a candidate for resection. He has extensive disease throughout the majority of the right hepatic lobe and has a single lesion in the left hepatic lobe. Surprisingly, he is clinically asymptomatic. He denies right upper quadrant pain although he does have fatigue, muscle aches and mild malaise. Given the circumstances he is in remarkably good spirits and has a positive outlook.  He does drink alcohol usually 1-2 glasses of wine daily.  We discussed the need for cessation while he undergoes any potential liver directed therapy.  Past Medical History:  Diagnosis Date  . Acute infarction of intestine, part and extent unspecified ([place]) 04/25/2016  . 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 Arthritis   . Atrial fibrillation ([place])   . Constipation   . Depression   . Diabetes mellitus type 2 with retinopathy ([place])   . Diverticulosis of colon   . Dysrhythmia    Atrial fib (found 07/2014)  . Fatty liver 02/29/00   abd ultrasound  . Full code status 09/26/2015  . GERD (gastroesophageal reflux disease)    occasional  . HCAP (healthcare-associated pneumonia) 11/27/2015   IV vanc/zosyn then levaquin courses x2   .  Hepatocellular carcinoma ([place]) 08/21/2016  . History of diabetes mellitus, type II    resolved with diet, h/o neuropathy  . History of tobacco use quit 1990s  . Hyperlipidemia   . Hypertension   . Hypertensive retinopathy of both eyes 2015   mild  . Lobar pneumonia ([place]) 10/18/2015  . Lower back pain   . 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 Neuropathy ([place])   . Non-small cell carcinoma of left lung ([place]) 02/2014   stage IIIb/IV on chemo  . Personal history of colonic adenomas 07/06/2013  . Pneumonia   . 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 Shortness of breath   . 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 Systolic murmur 0938   2Decho - normal LV fxn, EF 55%, mild AS, biatrial enlargement    Past Surgical History:  Procedure Laterality Date  . CATARACT EXTRACTION Right 03/2015   Dr [name]  . CHEST TUBE INSERTION Left 08/26/2014   Procedure: INSERTION OF LEFT  PLEURAL DRAINAGE CATHETER;  Surgeon: Ivin Poot, MD;  Location: [place];  Service: Thoracic;  Laterality: Left;  . COLONOSCOPY  2004  . COLONOSCOPY  2014   tubular adenoma x1, mod diverticulosis ([place])  . FLEXIBLE BRONCHOSCOPY  02/2014   WNL  . PORTACATH PLACEMENT Right 03/31/2015   Procedure: INSERTION PORT-A-CATH;  Surgeon: Ivin Poot, MD;  Location: [place];  Service: Thoracic;  Laterality: Right;  . REMOVAL OF PLEURAL DRAINAGE CATHETER Left 03/31/2015   Procedure: REMOVAL OF PLEURAL DRAINAGE CATHETER;  Surgeon: Collier Salina  Prescott Gum, MD;  Location: [place];  Service: Thoracic;  Laterality: Left;  Marland Kitchen VIDEO BRONCHOSCOPY Bilateral 03/17/2014   Procedure: VIDEO BRONCHOSCOPY WITH FLUORO;  Surgeon: Tanda Rockers, MD;  Location: [facility];  Service: Cardiopulmonary;  Laterality: Bilateral;    Allergies: Statins; Diltiazem hcl; Doxazosin mesylate; Hydrocodone; Nifedipine; Red yeast rice; and Sertraline  Medications: Prior to Admission medications     Medication Sig Start Date End Date Taking? Authorizing Provider  acetaminophen (TYLENOL) 500 MG tablet Take 500 mg by mouth 2 (two) times daily. Reported on 05/06/2016   Yes Historical Provider, MD  albuterol (PROVENTIL HFA;VENTOLIN HFA) 108 (90 BASE) MCG/ACT inhaler Inhale 2 puffs into the lungs every 6 (six) hours as needed for wheezing or shortness of breath. 09/08/15  Yes Adrena E Johnson, PA-C  benzonatate (TESSALON) 100 MG capsule Take 1-2 capsules (100-200 mg total) by mouth 3 (three) times daily as needed for cough. 09/02/16  Yes Tonia Ghent, MD  ELIQUIS 5 MG TABS tablet TAKE ONE TABLET BY MOUTH TWICE DAILY 10/31/16  Yes [name], MD  glipiZIDE (GLUCOTROL) 5 MG tablet Take 1 tablet (5 mg total) by mouth as directed. Take one daily when taking steroid (dexamethasone). 12/30/15  Yes [name], MD  glucose blood (ONE TOUCH ULTRA TEST) test strip Use to check sugar once daily and as needed. Dx: E09.9 05/17/16  Yes [name], MD  guaiFENesin (MUCINEX) 600 MG 12 hr tablet Take 600 mg by mouth 2 (two) times daily as needed for cough or to loosen phlegm.    Yes Historical Provider, MD  hydrocortisone (ANUSOL-HC) 25 MG suppository Place 1 suppository (25 mg total) rectally 2 (two) times daily. Patient taking differently: Place 25 mg rectally 2 (two) times daily as needed for hemorrhoids or itching.  07/26/15  Yes Jearld Fenton, NP  KLOR-CON M10 10 MEQ tablet Take 2 tablets (20 mEq total) by mouth every other day. 08/06/16  Yes [name], MD  lidocaine-prilocaine (EMLA) cream Apply 1 application topically as needed. Patient taking differently: Apply 1 application topically as needed (for port access).  05/08/16  Yes Curt Bears, MD  methocarbamol (ROBAXIN) 500 MG tablet Take 500 mg by mouth 3 (three) times daily as needed. Muscle pain 05/06/16  Yes [name], MD  metoprolol tartrate (LOPRESSOR) 50 MG tablet Take 0.5 tablets (25 mg total) by mouth 2 (two) times daily.  05/17/16  Yes [name], MD  [facility] DELICA LANCETS 20U MISC Use to check sugar once daily and as needed. Dx:E09.9 05/17/16  Yes [name], MD  polyethylene glycol [facility] / GLYCOLAX) packet Take 8.5 g by mouth daily.    Yes Historical Provider, MD  spironolactone (ALDACTONE) 50 MG tablet Take 1 tablet (50 mg total) by mouth daily. 08/06/16  Yes [name], MD  sucralfate (CARAFATE) 1 g tablet Take 1 tablet (1 g total) by mouth 3 (three) times daily before meals. Patient taking differently: Take 1 g by mouth 3 (three) times daily as needed.  02/13/16  Yes [name], MD  torsemide (DEMADEX) 20 MG tablet Take 1 tablet (20 mg total) by mouth every other day. 08/06/16  Yes [name], MD  traMADol (ULTRAM) 50 MG tablet Take 1 tablet (50 mg total) by mouth every 6 (six) hours as needed. Patient taking differently: Take 50 mg by mouth every 6 (six) hours as needed for moderate pain.  05/08/16  Yes Curt Bears, MD  Multiple Vitamins-Minerals (ICAPS) CAPS Take 1 capsule by mouth 2 (two) times  daily.     Historical Provider, MD     Family History  Problem Relation Age of Onset  . Stroke Mother     multiple mini strokes  . Hypertension Mother   . Prostate cancer Father   . Heart disease Maternal Grandfather     MI  . Stroke Paternal Grandmother     Social History   Social History  . Marital status: Married    Spouse name: N/A  . Number of children: 2  . Years of education: Bachelors   Occupational History  . [employer], picks up golf balls.  [employer]    Social History Main Topics  . Smoking status: Former Smoker    Packs/day: 3.00    Years: 30.00    Types: Cigarettes    Quit date: 12/23/1988  . Smokeless tobacco: Never Used  . Alcohol use 12.6 oz/week    21 Glasses of wine per week     Comment: 3-4 wine a day ( 8/03- 2 beers, 2 wines, 2 brandies daily)  . Drug use: No  . Sexual activity: No   Other Topics Concern  . None   Social History Narrative    Lives with wife.   [employer], failed eye exam, quit   Activity: active at gym regularly, golfing   Diet: healthy - oatmeal, avoids white starches   Right-handed.   1-2 cups caffeine per day.      Advanced directives: Full code. Wouldn't want prolonged artificial life support. Wife would be HCproxy    ECOG Status: 1 - Symptomatic but completely ambulatory  Review of Systems: A 12 point ROS discussed and pertinent positives are indicated in the HPI above.  All other systems are negative.  Review of Systems  Vital Signs: BP 119/66   Pulse 76   Temp 97.6 F (36.4 C)   Resp (!) 22   SpO2 98%   Physical Exam  Constitutional: He is oriented to person, place, and time. He appears well-developed and well-nourished. No distress.  HENT:  Head: Normocephalic and atraumatic.  Eyes: No scleral icterus.  Cardiovascular: Normal rate and regular rhythm.   Pulmonary/Chest: Effort normal.  Abdominal: Soft. He exhibits mass.  Palpable liver margin  Neurological: He is alert and oriented to person, place, and time.  Skin: Skin is warm and dry.  Psychiatric: He has a normal mood and affect. His behavior is normal.  Nursing note and vitals reviewed.    Imaging: Ct Chest W Contrast  Result Date: 10/29/2016 CLINICAL DATA:  Metastatic lung cancer with liver metastases. EXAM: CT CHEST, ABDOMEN, AND PELVIS WITH CONTRAST TECHNIQUE: Multidetector CT imaging of the chest, abdomen and pelvis was performed following the standard protocol during bolus administration of intravenous contrast. CONTRAST:  141m ISOVUE-300 IOPAMIDOL (ISOVUE-300) INJECTION 61% COMPARISON:  07/29/2016 FINDINGS: CT CHEST FINDINGS Cardiovascular: Heart size appears normal. No pericardial effusion. Aortic atherosclerosis. Calcification within the LAD coronary artery noted. Mediastinum/Nodes: The trachea appears patent and is midline. Normal appearance of the esophagus. Pre-vascular node measures 1.3 cm, image 22 of series 2.  Previously 2.0 cm. Right hilar node measures 2.1 x 1.9 cm, image 28 of series 2. Previously 3.6 x 3.2 cm. Lungs/Pleura: No pleural fluid. Moderate changes of centrilobular emphysema. The triangular-shaped density within the periphery of the left lower lobe measures 1 x 0.6 cm, image 83 of series 5. Previously 0.7 x 0.6 cm. Stable scarring and fibrosis involving the superior segment of left lower lobe. Musculoskeletal: There is a small subpleural nodule within  the left lower lobe measuring 4 mm, image number 95 of series 5. Unchanged from previous exam. CT ABDOMEN PELVIS FINDINGS Hepatobiliary: Large confluent mass involving the right lobe of liver is again noted and appears ill defined measuring 10.9 x 17.3 by 18.2 cm. Previously this measured 9.5 x 16.1 by 9.2 cm. Index lesion in the lateral segment of left lobe measures 2.4 by 1.5 cm. New from previous exam. Stones and/or sludge noted within the dependent portion of the gallbladder. Pancreas: Unremarkable. No pancreatic ductal dilatation or surrounding inflammatory changes. Spleen: Normal in size without focal abnormality. Adrenals/Urinary Tract: Adrenal glands are unremarkable. Kidneys are normal, without renal calculi, focal lesion, or hydronephrosis. Bladder is unremarkable. Stomach/Bowel: The stomach appears normal. The small bowel loops have a normal course and caliber. The appendix is visualized and appears normal. Unremarkable appearance of the colon. Vascular/Lymphatic: Aortic atherosclerosis. No aneurysm. Portacaval lymph node measures 1.7 cm, image 66 of series 2. On the previous exam this measured 1 cm. Reproductive: Prostate gland appears enlarged. Other: There is no ascites or focal fluid collections within the abdomen or pelvis. Musculoskeletal: No aggressive lytic or sclerotic bone lesions. IMPRESSION: 1. Interval mixed response to therapy. 2. There has been decrease in size of anterior mediastinal and right hilar lymph node metastases. 3.  Progression of multifocal liver metastasis with new lesion in the lateral segment of left lobe. 4. Interval enlargement of portacaval lymph node compatible with metastatic adenopathy. Electronically Signed   By: Kerby Moors M.D.   On: 10/29/2016 16:08   Ct Abdomen Pelvis W Contrast  Result Date: 10/29/2016 CLINICAL DATA:  Metastatic lung cancer with liver metastases. EXAM: CT CHEST, ABDOMEN, AND PELVIS WITH CONTRAST TECHNIQUE: Multidetector CT imaging of the chest, abdomen and pelvis was performed following the standard protocol during bolus administration of intravenous contrast. CONTRAST:  117m ISOVUE-300 IOPAMIDOL (ISOVUE-300) INJECTION 61% COMPARISON:  07/29/2016 FINDINGS: CT CHEST FINDINGS Cardiovascular: Heart size appears normal. No pericardial effusion. Aortic atherosclerosis. Calcification within the LAD coronary artery noted. Mediastinum/Nodes: The trachea appears patent and is midline. Normal appearance of the esophagus. Pre-vascular node measures 1.3 cm, image 22 of series 2. Previously 2.0 cm. Right hilar node measures 2.1 x 1.9 cm, image 28 of series 2. Previously 3.6 x 3.2 cm. Lungs/Pleura: No pleural fluid. Moderate changes of centrilobular emphysema. The triangular-shaped density within the periphery of the left lower lobe measures 1 x 0.6 cm, image 83 of series 5. Previously 0.7 x 0.6 cm. Stable scarring and fibrosis involving the superior segment of left lower lobe. Musculoskeletal: There is a small subpleural nodule within the left lower lobe measuring 4 mm, image number 95 of series 5. Unchanged from previous exam. CT ABDOMEN PELVIS FINDINGS Hepatobiliary: Large confluent mass involving the right lobe of liver is again noted and appears ill defined measuring 10.9 x 17.3 by 18.2 cm. Previously this measured 9.5 x 16.1 by 9.2 cm. Index lesion in the lateral segment of left lobe measures 2.4 by 1.5 cm. New from previous exam. Stones and/or sludge noted within the dependent portion of the  gallbladder. Pancreas: Unremarkable. No pancreatic ductal dilatation or surrounding inflammatory changes. Spleen: Normal in size without focal abnormality. Adrenals/Urinary Tract: Adrenal glands are unremarkable. Kidneys are normal, without renal calculi, focal lesion, or hydronephrosis. Bladder is unremarkable. Stomach/Bowel: The stomach appears normal. The small bowel loops have a normal course and caliber. The appendix is visualized and appears normal. Unremarkable appearance of the colon. Vascular/Lymphatic: Aortic atherosclerosis. No aneurysm. Portacaval lymph node measures 1.7  cm, image 66 of series 2. On the previous exam this measured 1 cm. Reproductive: Prostate gland appears enlarged. Other: There is no ascites or focal fluid collections within the abdomen or pelvis. Musculoskeletal: No aggressive lytic or sclerotic bone lesions. IMPRESSION: 1. Interval mixed response to therapy. 2. There has been decrease in size of anterior mediastinal and right hilar lymph node metastases. 3. Progression of multifocal liver metastasis with new lesion in the lateral segment of left lobe. 4. Interval enlargement of portacaval lymph node compatible with metastatic adenopathy. Electronically Signed   By: Kerby Moors M.D.   On: 10/29/2016 16:08    Labs:  CBC:  Recent Labs  08/29/16 1332 09/19/16 1115 10/10/16 1036 10/31/16 0942  WBC 6.3 5.9 5.3 5.6  HGB 14.0 14.9 15.8 15.3  HCT 42.1 45.4 49.4 47.6  PLT 179 167 174 174    COAGS:  Recent Labs  08/08/16 0625  INR 1.05  APTT 36    BMP:  Recent Labs  12/01/15 0403 12/02/15 0620 12/03/15 0500 12/04/15 0200  12/28/15 1106 02/13/16 1405  08/29/16 1333 09/19/16 1115 10/10/16 1033 10/31/16 0942  NA 136 140 138 139  < > 141 139  < > 137 139 138 134*  K 4.3 4.1 3.9 3.8  < > 4.9 5.1  < > 4.8 4.8 4.6 5.0  CL 105 108 107 108  --  99 103  --   --   --   --   --   CO2 '25 26 26 27  '$ < > 37* 29  < > 23 20* 22 20*  GLUCOSE 116* 131* 105* 131*  < >  154* 128*  < > 117 215* 260* 252*  BUN 39* 25* 16 14  < > 14 27*  < > 32.4* 31.1* 37.5* 32.5*  CALCIUM 7.7* 7.5* 7.6* 7.4*  < > 8.5 9.2  < > 9.2 9.2 9.1 9.1  CREATININE 1.37* 1.08 0.81 0.82  < > 0.95 1.11  < > 1.1 1.3 1.5* 1.3  GFRNONAA 49* >60 >60 >60  --   --   --   --   --   --   --   --   GFRAA 57* >60 >60 >60  --   --   --   --   --   --   --   --   < > = values in this interval not displayed.  LIVER FUNCTION TESTS:  Recent Labs  08/29/16 1333 09/19/16 1115 10/10/16 1033 10/31/16 0942  BILITOT 0.54 0.74 0.43 0.62  AST 28 32 42* 46*  ALT 26 35 33 36  ALKPHOS 104 118 125 139  PROT 6.6 6.6 6.6 6.5  ALBUMIN 3.3* 3.2* 3.2* 3.0*    TUMOR MARKERS: No results for input(s): AFPTM, CEA, CA199, CHROMGRNA in the last 8760 hours.  Assessment and Plan:  Extremely pleasant but unfortunate 75 year old gentleman with both left lung primary adenocarcinoma and a primary hepatocellular carcinoma. The hepatocellular carcinoma is quite extensive diffusely involving more than two thirds of the right hepatic lobe lesion in the left hepatic lobe.  Despite the extensive tumor burden, this gentleman's hepatic reserve is excellent with a normal bilirubin and near normal LFTs.  Additionally, his functional status is good.    We discussed the risks, benefits and alternatives to Y90 radioembolization. I explained to him that given his disease of burden, there is some small risk of hepatic decompensation. We will need to monitor his liver function very closely following  treatment of the right lobe.  We also discussed that this therapy is palliative and not curative with the intent to prolong quality and duration of life.  We discussed at the next option after Y90 would likely be a targeted multikinase inhibitor such as Nexavar or alternative.  After much discussion, Shawn Espinoza and his wife would like to proceed with Y 90 radioembolization.  1.) Will schedule for pre Y90 mapping study ASAP to be  followed by RIGHT lobar radioembolization and then LEFT lobar embolization.   Thank you for this interesting consult.  I greatly enjoyed meeting Shawn Espinoza and look forward to participating in their care.  A copy of this report was sent to the requesting provider on this date.  Electronically Signed: Jacqulynn Cadet 11/12/2016, 4:29 PM   I spent a total of  60 Minutes  in face to face in clinical consultation, greater than 50% of which was counseling/coordinating care for multifocal [facility]

## 2016-11-13 ENCOUNTER — Other Ambulatory Visit: Payer: Self-pay | Admitting: Family Medicine

## 2016-11-13 HISTORY — PX: IR GENERIC HISTORICAL: IMG1180011

## 2016-11-13 MED ORDER — GLIPIZIDE 5 MG PO TABS: 5.0000 mg | ORAL_TABLET | Freq: Every day | ORAL | 6 refills | Status: AC

## 2016-11-19 ENCOUNTER — Other Ambulatory Visit: Payer: Self-pay | Admitting: Interventional Radiology

## 2016-11-19 DIAGNOSIS — C220 Liver cell carcinoma: Secondary | ICD-10-CM

## 2016-11-20 ENCOUNTER — Ambulatory Visit (INDEPENDENT_AMBULATORY_CARE_PROVIDER_SITE_OTHER): Payer: Medicare Other | Admitting: Podiatry

## 2016-11-20 ENCOUNTER — Encounter: Payer: Self-pay | Admitting: Podiatry

## 2016-11-20 VITALS — BP 124/74 | HR 76 | Resp 18

## 2016-11-20 DIAGNOSIS — B351 Tinea unguium: Secondary | ICD-10-CM

## 2016-11-20 DIAGNOSIS — M79676 Pain in unspecified toe(s): Secondary | ICD-10-CM | POA: Diagnosis not present

## 2016-11-20 NOTE — Patient Instructions (Signed)

## 2016-11-20 NOTE — Progress Notes (Signed)
Patient ID: Shawn Espinoza, male   DOB: [birth year], 75 y.o.   MRN: [id number]   Subjective: This patient presents again for scheduled visit complaining that his toenails are elongated and thickened INR comfortable walking wearing shoes and requesting nail debridement Patient under active treatment for long and liver cancer  Objective: Orientated 3 Vascular: DP and PT pulses 1/4 bilaterally Capillary reflex immediate bilaterally  Neurological: Sensation to 10 g monofilament wire intact 4/5 right and 1/5 left Vibratory sensation nonreactive bilaterally Ankle reflex reactive bilaterally  Dermatological: Atrophic skin without hair growth bilaterally The toenails are elongated, brittle, friable, incurvated and tender to direct palpation 6-10 No Open skin lesions bilaterally  Musculoskeletal: Hammertoe fifth bilaterally Patient walks with a roller walker  Assessment: 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

## 2016-11-21 ENCOUNTER — Telehealth: Payer: Self-pay | Admitting: Internal Medicine

## 2016-11-21 ENCOUNTER — Encounter: Payer: Self-pay | Admitting: Internal Medicine

## 2016-11-21 ENCOUNTER — Ambulatory Visit (HOSPITAL_BASED_OUTPATIENT_CLINIC_OR_DEPARTMENT_OTHER): Payer: Medicare Other | Admitting: Internal Medicine

## 2016-11-21 ENCOUNTER — Other Ambulatory Visit (HOSPITAL_BASED_OUTPATIENT_CLINIC_OR_DEPARTMENT_OTHER): Payer: Medicare Other

## 2016-11-21 ENCOUNTER — Ambulatory Visit (HOSPITAL_BASED_OUTPATIENT_CLINIC_OR_DEPARTMENT_OTHER): Payer: Medicare Other

## 2016-11-21 ENCOUNTER — Ambulatory Visit: Payer: Medicare Other

## 2016-11-21 ENCOUNTER — Telehealth: Payer: Self-pay | Admitting: *Deleted

## 2016-11-21 VITALS — BP 130/78 | HR 69 | Temp 98.1°F | Resp 17 | Ht 69.0 in | Wt 174.8 lb

## 2016-11-21 DIAGNOSIS — I63522 Cerebral infarction due to unspecified occlusion or stenosis of left anterior cerebral artery: Secondary | ICD-10-CM

## 2016-11-21 DIAGNOSIS — Z95828 Presence of other vascular implants and grafts: Secondary | ICD-10-CM

## 2016-11-21 DIAGNOSIS — Z5112 Encounter for antineoplastic immunotherapy: Secondary | ICD-10-CM

## 2016-11-21 DIAGNOSIS — C3492 Malignant neoplasm of unspecified part of left bronchus or lung: Secondary | ICD-10-CM

## 2016-11-21 DIAGNOSIS — R609 Edema, unspecified: Secondary | ICD-10-CM | POA: Diagnosis not present

## 2016-11-21 DIAGNOSIS — C778 Secondary and unspecified malignant neoplasm of lymph nodes of multiple regions: Secondary | ICD-10-CM

## 2016-11-21 DIAGNOSIS — C3411 Malignant neoplasm of upper lobe, right bronchus or lung: Secondary | ICD-10-CM

## 2016-11-21 DIAGNOSIS — I480 Paroxysmal atrial fibrillation: Secondary | ICD-10-CM

## 2016-11-21 DIAGNOSIS — I4891 Unspecified atrial fibrillation: Secondary | ICD-10-CM

## 2016-11-21 DIAGNOSIS — R0609 Other forms of dyspnea: Secondary | ICD-10-CM

## 2016-11-21 DIAGNOSIS — R531 Weakness: Secondary | ICD-10-CM

## 2016-11-21 DIAGNOSIS — C220 Liver cell carcinoma: Secondary | ICD-10-CM | POA: Diagnosis not present

## 2016-11-21 DIAGNOSIS — Z79899 Other long term (current) drug therapy: Secondary | ICD-10-CM | POA: Diagnosis not present

## 2016-11-21 DIAGNOSIS — R530 Neoplastic (malignant) related fatigue: Secondary | ICD-10-CM

## 2016-11-21 LAB — COMPREHENSIVE METABOLIC PANEL
ALT: 54 U/L (ref 0–55)
ANION GAP: 9 meq/L (ref 3–11)
AST: 67 U/L — AB (ref 5–34)
Albumin: 2.9 g/dL — ABNORMAL LOW (ref 3.5–5.0)
Alkaline Phosphatase: 183 U/L — ABNORMAL HIGH (ref 40–150)
BUN: 28.3 mg/dL — AB (ref 7.0–26.0)
CHLORIDE: 105 meq/L (ref 98–109)
CO2: 21 meq/L — AB (ref 22–29)
CREATININE: 1.2 mg/dL (ref 0.7–1.3)
Calcium: 9.2 mg/dL (ref 8.4–10.4)
EGFR: 59 mL/min/{1.73_m2} — ABNORMAL LOW (ref >90)
Glucose: 211 mg/dl — ABNORMAL HIGH (ref 70–140)
Potassium: 5.3 mEq/L — ABNORMAL HIGH (ref 3.5–5.1)
Sodium: 136 mEq/L (ref 136–145)
Total Bilirubin: 0.95 mg/dL (ref 0.20–1.20)
Total Protein: 6.5 g/dL (ref 6.4–8.3)

## 2016-11-21 LAB — CBC WITH DIFFERENTIAL/PLATELET
BASO%: 0.4 % (ref 0.0–2.0)
Basophils Absolute: 0 10*3/uL (ref 0.0–0.1)
EOS%: 2.4 % (ref 0.0–7.0)
Eosinophils Absolute: 0.1 10*3/uL (ref 0.0–0.5)
HCT: 50.7 % — ABNORMAL HIGH (ref 38.4–49.9)
HGB: 16.2 g/dL (ref 13.0–17.1)
LYMPH%: 14.3 % (ref 14.0–49.0)
MCH: 28.1 pg (ref 27.2–33.4)
MCHC: 32 g/dL (ref 32.0–36.0)
MCV: 88 fL (ref 79.3–98.0)
MONO#: 0.4 10*3/uL (ref 0.1–0.9)
MONO%: 7.5 % (ref 0.0–14.0)
NEUT#: 4.1 10*3/uL (ref 1.5–6.5)
NEUT%: 75.4 % — AB (ref 39.0–75.0)
PLATELETS: 183 10*3/uL (ref 140–400)
RBC: 5.76 10*6/uL (ref 4.20–5.82)
RDW: 16.7 % — ABNORMAL HIGH (ref 11.0–14.6)
WBC: 5.4 10*3/uL (ref 4.0–10.3)
lymph#: 0.8 10*3/uL — ABNORMAL LOW (ref 0.9–3.3)

## 2016-11-21 LAB — TSH: TSH: 4.156 m[IU]/L — AB (ref 0.320–4.118)

## 2016-11-21 MED ORDER — SODIUM CHLORIDE 0.9% FLUSH
10.0000 mL | INTRAVENOUS | Status: DC | PRN
  Administered 2016-11-21: 10 mL
  Filled 2016-11-21: qty 10

## 2016-11-21 MED ORDER — SODIUM CHLORIDE 0.9 % IJ SOLN
10.0000 mL | INTRAMUSCULAR | Status: AC | PRN
  Administered 2016-11-21: 10 mL
  Filled 2016-11-21: qty 10

## 2016-11-21 MED ORDER — SODIUM CHLORIDE 0.9 % IV SOLN
200.0000 mg | Freq: Once | INTRAVENOUS | Status: AC
  Administered 2016-11-21: 200 mg via INTRAVENOUS
  Filled 2016-11-21: qty 8

## 2016-11-21 MED ORDER — SODIUM CHLORIDE 0.9 % IV SOLN
Freq: Once | INTRAVENOUS | Status: AC
  Administered 2016-11-21: 14:00:00 via INTRAVENOUS

## 2016-11-21 MED ORDER — HEPARIN SOD (PORK) LOCK FLUSH 100 UNIT/ML IV SOLN
500.0000 [IU] | Freq: Once | INTRAVENOUS | Status: AC | PRN
  Administered 2016-11-21: 500 [IU]
  Filled 2016-11-21: qty 5

## 2016-11-21 NOTE — Progress Notes (Signed)
Pt saw Dr. [name] today prior to chemo.  OK to treat with AST  67 as per md.

## 2016-11-21 NOTE — Telephone Encounter (Signed)
Per LOS I have fixed the patients schedule. Pateitn given calendar, sent MD message for MD visit.m

## 2016-11-21 NOTE — Progress Notes (Signed)
[place] [phone]   Fax:([phone]) [place], [place] [address]  DIAGNOSIS:  1)  metastatic non-small cell lung cancer initially diagnosed as a stage IIIB/IV adenocarcinoma in March 2015.  2) unresectable hepatocellular carcinoma diagnosed in August 2017.  PRIOR THERAPY:  1) Systemic chemotherapy with carboplatin for AUC of 5 and Alimta 500 mg/M2 every 3 weeks. First dose expected on 04/13/2014. Status post 6 cycles. 2)  Maintenance chemotherapy with single agent Alimta 500 mg/M2 every 3 weeks. First cycle on 09/06/2014. Status post 3 cycles. 3) Second course of systemic chemotherapy with carboplatin for AUC of 5 and Alimta 500 MG/M2 every 3 weeks. First cycle 11/08/2014. Status post 6 cycles. 4)  Immunotherapy with Nivolumab 3 MG/KG every 2 weeks. First dose 05/24/2015. Status post 3 cycles. 5) Systemic chemotherapy with docetaxel 60 MG/M2 and Cyramza 10 MG/KG every 3 weeks status post 6 cycles. Last dose was given 11/21/2015 with stable disease. 6) Systemic chemotherapy with docetaxel 75 MG/M2 every 3 weeks. First cycle 05/08/2016. Status post one cycle. Discontinued secondary to intolerance to the Neulasta injection. 7) Systemic chemotherapy with docetaxel 25 MG/M2 every weekly. First cycle 05/29/2016. Status post 9 cycles, last dose was given 07/24/2016. Discontinued secondary to disease progression.  CURRENT THERAPY: Ketruda (pembrolizumab) 200 MG IV every 3 weeks. First dose started 08/29/2016. Status post 4 cycles.  INTERVAL HISTORY: Shawn Espinoza 75 y.o. male with very complicated conditioned and to malignancy including recurrent non-small cell lung cancer as well as hepatocellular carcinoma. The patient is currently undergoing immunotherapy with Ketruda (pembrolizumab) 200 mg IV every 3 weeks status post 4 cycles. He tolerated the last cycle well. He continues to have  generalized fatigue and weakness. He also has shortness of breath with exertion. He denied having any skin rash, diarrhea. He has no nausea or vomiting. He was referred to interventional radiology for consideration of Y90 treatment of the metastatic hepatocellular carcinoma. He is scheduled for the first treatment next week. He is also scheduled for another treatment on 12/12/2016. He is here today for evaluation before starting cycle #5 of his immunotherapy.  MEDICAL HISTORY: Past Medical History:  Diagnosis Date  . Acute infarction of intestine, part and extent unspecified ([place]) 04/25/2016  . 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 Arthritis   . Atrial fibrillation ([place])   . Constipation   . Depression   . Diabetes mellitus type 2 with retinopathy ([place])   . Diverticulosis of colon   . Dysrhythmia    Atrial fib (found 07/2014)  . Fatty liver 02/29/00   abd ultrasound  . Full code status 09/26/2015  . GERD (gastroesophageal reflux disease)    occasional  . HCAP (healthcare-associated pneumonia) 11/27/2015   IV vanc/zosyn then levaquin courses x2   . Hepatocellular carcinoma ([place]) 08/21/2016  . History of diabetes mellitus, type II    resolved with diet, h/o neuropathy  . History of tobacco use quit 1990s  . Hyperlipidemia   . Hypertension   . Hypertensive retinopathy of both eyes 2015   mild  . Lobar pneumonia ([place]) 10/18/2015  . Lower back pain   . 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 Neuropathy ([place])   . Non-small cell carcinoma of left lung ([place]) 02/2014   stage IIIb/IV on chemo  . Personal history of colonic adenomas 07/06/2013  .  Pneumonia   . 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 Shortness of breath   . 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 Systolic murmur 4540   2Decho - normal LV fxn, EF 55%, mild AS, biatrial enlargement    ALLERGIES:  is allergic to statins;  diltiazem hcl; doxazosin mesylate; hydrocodone; nifedipine; red yeast rice; and sertraline.  MEDICATIONS:  Current Outpatient Prescriptions  Medication Sig Dispense Refill  . acetaminophen (TYLENOL) 500 MG tablet Take 500 mg by mouth 2 (two) times daily. Reported on 05/06/2016    . albuterol (PROVENTIL HFA;VENTOLIN HFA) 108 (90 BASE) MCG/ACT inhaler Inhale 2 puffs into the lungs every 6 (six) hours as needed for wheezing or shortness of breath. 1 Inhaler 3  . benzonatate (TESSALON) 100 MG capsule Take 1-2 capsules (100-200 mg total) by mouth 3 (three) times daily as needed for cough. 40 capsule 1  . ELIQUIS 5 MG TABS tablet TAKE ONE TABLET BY MOUTH TWICE DAILY 180 tablet 1  . glipiZIDE (GLUCOTROL) 5 MG tablet Take 1 tablet (5 mg total) by mouth daily before breakfast. 30 tablet 6  . glucose blood (ONE TOUCH ULTRA TEST) test strip Use to check sugar once daily and as needed. Dx: E09.9 100 each 3  . guaiFENesin (MUCINEX) 600 MG 12 hr tablet Take 600 mg by mouth 2 (two) times daily as needed for cough or to loosen phlegm.     . hydrocortisone (ANUSOL-HC) 25 MG suppository Place 1 suppository (25 mg total) rectally 2 (two) times daily. (Patient taking differently: Place 25 mg rectally 2 (two) times daily as needed for hemorrhoids or itching. ) 12 suppository 0  . KLOR-CON M10 10 MEQ tablet Take 2 tablets (20 mEq total) by mouth every other day. 30 tablet 6  . lidocaine-prilocaine (EMLA) cream Apply 1 application topically as needed. (Patient taking differently: Apply 1 application topically as needed (for port access). ) 30 g 1  . methocarbamol (ROBAXIN) 500 MG tablet Take 500 mg by mouth 3 (three) times daily as needed. Muscle pain    . metoprolol tartrate (LOPRESSOR) 50 MG tablet Take 0.5 tablets (25 mg total) by mouth 2 (two) times daily. 30 tablet 6  . Multiple Vitamins-Minerals (ICAPS) CAPS Take 1 capsule by mouth 2 (two) times daily.     [name] DELICA LANCETS 98J MISC Use to check sugar once  daily and as needed. Dx:E09.9 100 each 3  . polyethylene glycol (MIRALAX / GLYCOLAX) packet Take 8.5 g by mouth daily.     Marland Kitchen spironolactone (ALDACTONE) 50 MG tablet Take 1 tablet (50 mg total) by mouth daily.    . sucralfate (CARAFATE) 1 g tablet Take 1 tablet (1 g total) by mouth 3 (three) times daily before meals. (Patient taking differently: Take 1 g by mouth 3 (three) times daily as needed. ) 90 tablet 1  . torsemide (DEMADEX) 20 MG tablet Take 1 tablet (20 mg total) by mouth every other day. 30 tablet 6  . traMADol (ULTRAM) 50 MG tablet Take 1 tablet (50 mg total) by mouth every 6 (six) hours as needed. (Patient taking differently: Take 50 mg by mouth every 6 (six) hours as needed for moderate pain. ) 30 tablet 3   No current facility-administered medications for this visit.     SURGICAL HISTORY:  Past Surgical History:  Procedure Laterality Date  . CATARACT EXTRACTION Right 03/2015   Dr [name]  . CHEST TUBE INSERTION Left 08/26/2014  Procedure: INSERTION OF LEFT  PLEURAL DRAINAGE CATHETER;  Surgeon: Ivin Poot, MD;  Location: [facility];  Service: Thoracic;  Laterality: Left;  . COLONOSCOPY  2004  . COLONOSCOPY  2014   tubular adenoma x1, mod diverticulosis ([place])  . FLEXIBLE BRONCHOSCOPY  02/2014   WNL  . PORTACATH PLACEMENT Right 03/31/2015   Procedure: INSERTION PORT-A-CATH;  Surgeon: Ivin Poot, MD;  Location: [facility];  Service: Thoracic;  Laterality: Right;  . REMOVAL OF PLEURAL DRAINAGE CATHETER Left 03/31/2015   Procedure: REMOVAL OF PLEURAL DRAINAGE CATHETER;  Surgeon: Ivin Poot, MD;  Location: [facility];  Service: Thoracic;  Laterality: Left;  Marland Kitchen VIDEO BRONCHOSCOPY Bilateral 03/17/2014   Procedure: VIDEO BRONCHOSCOPY WITH FLUORO;  Surgeon: Tanda Rockers, MD;  Location: [facility];  Service: Cardiopulmonary;  Laterality: Bilateral;    REVIEW OF SYSTEMS:  Constitutional: positive for anorexia and fatigue Eyes: negative for irritation and redness Ears, nose, mouth,  throat, and face: negative for epistaxis, hoarseness, sore mouth and sore throat Respiratory: positive for cough and dyspnea on exertion Cardiovascular: negative for orthopnea and palpitations Gastrointestinal: negative for constipation, diarrhea, nausea and vomiting Genitourinary:negative Integument/breast: negative Hematologic/lymphatic: negative for bleeding and easy bruising Musculoskeletal:positive for muscle weakness Neurological: negative Behavioral/Psych: negative Endocrine: negative Allergic/Immunologic: negative   PHYSICAL EXAMINATION: General appearance: alert, cooperative and fatigued Head: Normocephalic, without obvious abnormality, atraumatic Neck: no adenopathy, supple, symmetrical, trachea midline and thyroid not enlarged, symmetric, no tenderness/mass/nodules Lymph nodes: Cervical, supraclavicular, and axillary nodes normal. Resp: clear to auscultation bilaterally Back: symmetric, no curvature. ROM normal. No CVA tenderness. Cardio: regular rate and rhythm, S1, S2 normal, no murmur, click, rub or gallop GI: soft, non-tender; bowel sounds normal; no masses,  no organomegaly Extremities: extremities normal, atraumatic, no cyanosis or edema Neurologic: Alert and oriented X 3, normal strength and tone. Normal symmetric reflexes. Normal coordination and gait  ECOG PERFORMANCE STATUS: 1 - Symptomatic but completely ambulatory  Blood pressure 130/78, pulse 69, temperature 98.1 F (36.7 C), temperature source Oral, resp. rate 17, height '5\' 9"'$  (1.753 m), weight 174 lb 12.8 oz (79.3 kg), SpO2 98 %.  LABORATORY DATA: Lab Results  Component Value Date   WBC 5.4 11/21/2016   HGB 16.2 11/21/2016   HCT 50.7 (H) 11/21/2016   MCV 88.0 11/21/2016   PLT 183 11/21/2016      Chemistry      Component Value Date/Time   NA 136 11/21/2016 1131   K 5.3 (H) 11/21/2016 1131   CL 103 02/13/2016 1405   CO2 21 (L) 11/21/2016 1131   BUN 28.3 (H) 11/21/2016 1131   CREATININE 1.2  11/21/2016 1131      Component Value Date/Time   CALCIUM 9.2 11/21/2016 1131   ALKPHOS 183 (H) 11/21/2016 1131   AST 67 (H) 11/21/2016 1131   ALT 54 11/21/2016 1131   BILITOT 0.95 11/21/2016 1131       RADIOGRAPHIC STUDIES: Ct Chest W Contrast  Result Date: 10/29/2016 CLINICAL DATA:  Metastatic lung cancer with liver metastases. EXAM: CT CHEST, ABDOMEN, AND PELVIS WITH CONTRAST TECHNIQUE: Multidetector CT imaging of the chest, abdomen and pelvis was performed following the standard protocol during bolus administration of intravenous contrast. CONTRAST:  134m ISOVUE-300 IOPAMIDOL (ISOVUE-300) INJECTION 61% COMPARISON:  07/29/2016 FINDINGS: CT CHEST FINDINGS Cardiovascular: Heart size appears normal. No pericardial effusion. Aortic atherosclerosis. Calcification within the LAD coronary artery noted. Mediastinum/Nodes: The trachea appears patent and is midline. Normal appearance of the esophagus. Pre-vascular node measures 1.3 cm, image 22 of  series 2. Previously 2.0 cm. Right hilar node measures 2.1 x 1.9 cm, image 28 of series 2. Previously 3.6 x 3.2 cm. Lungs/Pleura: No pleural fluid. Moderate changes of centrilobular emphysema. The triangular-shaped density within the periphery of the left lower lobe measures 1 x 0.6 cm, image 83 of series 5. Previously 0.7 x 0.6 cm. Stable scarring and fibrosis involving the superior segment of left lower lobe. Musculoskeletal: There is a small subpleural nodule within the left lower lobe measuring 4 mm, image number 95 of series 5. Unchanged from previous exam. CT ABDOMEN PELVIS FINDINGS Hepatobiliary: Large confluent mass involving the right lobe of liver is again noted and appears ill defined measuring 10.9 x 17.3 by 18.2 cm. Previously this measured 9.5 x 16.1 by 9.2 cm. Index lesion in the lateral segment of left lobe measures 2.4 by 1.5 cm. New from previous exam. Stones and/or sludge noted within the dependent portion of the gallbladder. Pancreas:  Unremarkable. No pancreatic ductal dilatation or surrounding inflammatory changes. Spleen: Normal in size without focal abnormality. Adrenals/Urinary Tract: Adrenal glands are unremarkable. Kidneys are normal, without renal calculi, focal lesion, or hydronephrosis. Bladder is unremarkable. Stomach/Bowel: The stomach appears normal. The small bowel loops have a normal course and caliber. The appendix is visualized and appears normal. Unremarkable appearance of the colon. Vascular/Lymphatic: Aortic atherosclerosis. No aneurysm. Portacaval lymph node measures 1.7 cm, image 66 of series 2. On the previous exam this measured 1 cm. Reproductive: Prostate gland appears enlarged. Other: There is no ascites or focal fluid collections within the abdomen or pelvis. Musculoskeletal: No aggressive lytic or sclerotic bone lesions. IMPRESSION: 1. Interval mixed response to therapy. 2. There has been decrease in size of anterior mediastinal and right hilar lymph node metastases. 3. Progression of multifocal liver metastasis with new lesion in the lateral segment of left lobe. 4. Interval enlargement of portacaval lymph node compatible with metastatic adenopathy. Electronically Signed   By: Kerby Moors M.D.   On: 10/29/2016 16:08   Ct Abdomen Pelvis W Contrast  Result Date: 10/29/2016 CLINICAL DATA:  Metastatic lung cancer with liver metastases. EXAM: CT CHEST, ABDOMEN, AND PELVIS WITH CONTRAST TECHNIQUE: Multidetector CT imaging of the chest, abdomen and pelvis was performed following the standard protocol during bolus administration of intravenous contrast. CONTRAST:  165m ISOVUE-300 IOPAMIDOL (ISOVUE-300) INJECTION 61% COMPARISON:  07/29/2016 FINDINGS: CT CHEST FINDINGS Cardiovascular: Heart size appears normal. No pericardial effusion. Aortic atherosclerosis. Calcification within the LAD coronary artery noted. Mediastinum/Nodes: The trachea appears patent and is midline. Normal appearance of the esophagus. Pre-vascular  node measures 1.3 cm, image 22 of series 2. Previously 2.0 cm. Right hilar node measures 2.1 x 1.9 cm, image 28 of series 2. Previously 3.6 x 3.2 cm. Lungs/Pleura: No pleural fluid. Moderate changes of centrilobular emphysema. The triangular-shaped density within the periphery of the left lower lobe measures 1 x 0.6 cm, image 83 of series 5. Previously 0.7 x 0.6 cm. Stable scarring and fibrosis involving the superior segment of left lower lobe. Musculoskeletal: There is a small subpleural nodule within the left lower lobe measuring 4 mm, image number 95 of series 5. Unchanged from previous exam. CT ABDOMEN PELVIS FINDINGS Hepatobiliary: Large confluent mass involving the right lobe of liver is again noted and appears ill defined measuring 10.9 x 17.3 by 18.2 cm. Previously this measured 9.5 x 16.1 by 9.2 cm. Index lesion in the lateral segment of left lobe measures 2.4 by 1.5 cm. New from previous exam. Stones and/or sludge noted within the  dependent portion of the gallbladder. Pancreas: Unremarkable. No pancreatic ductal dilatation or surrounding inflammatory changes. Spleen: Normal in size without focal abnormality. Adrenals/Urinary Tract: Adrenal glands are unremarkable. Kidneys are normal, without renal calculi, focal lesion, or hydronephrosis. Bladder is unremarkable. Stomach/Bowel: The stomach appears normal. The small bowel loops have a normal course and caliber. The appendix is visualized and appears normal. Unremarkable appearance of the colon. Vascular/Lymphatic: Aortic atherosclerosis. No aneurysm. Portacaval lymph node measures 1.7 cm, image 66 of series 2. On the previous exam this measured 1 cm. Reproductive: Prostate gland appears enlarged. Other: There is no ascites or focal fluid collections within the abdomen or pelvis. Musculoskeletal: No aggressive lytic or sclerotic bone lesions. IMPRESSION: 1. Interval mixed response to therapy. 2. There has been decrease in size of anterior mediastinal and  right hilar lymph node metastases. 3. Progression of multifocal liver metastasis with new lesion in the lateral segment of left lobe. 4. Interval enlargement of portacaval lymph node compatible with metastatic adenopathy. Electronically Signed   By: Kerby Moors M.D.   On: 10/29/2016 16:08   ASSESSMENT AND PLAN: This is a very pleasant 75 years old white male with a very complicated history and the following diagnosis:  1) stage IV non-small cell lung cancer, adenocarcinoma Stage IV non-small cell lung cancer, adenocarcinoma diagnosed in March 2015. He is status post several regimen of systemic chemotherapy including carboplatin and Alimta, maintenance Alimta, treatment with immunotherapy with Nivolumab as well as systemic chemotherapy with docetaxel and Cyramza with disease progression and the chest after the last treatment. He is currently on treatment with Ketruda (pembrolizumab) and tolerating it well. His last CT scan of the chest showed a stable disease. I recommended for the patient to proceed with cycle #5 today as scheduled.  2) unresectable hepatocellular carcinoma diagnosed in August 2017: He is currently undergoing treatment with [place] (pembrolizumab) which also was approved in this disease. His last scan showed mixed response. I recommended for him to continue his treatment with [place] (pembrolizumab) and he will receive cycle #5 today. I also referred the patient to interventional radiology for consideration of treatment with radioembolization. He is expected to receive this treatment this months. The second treatment would have a conflict with his scheduled treatment for cycle #6. I will delay the treatment of cycle #6 x 1 week for the patient to complete the radioembolization.  3) lower extremity edema: Improved. Continue Demadex.  4) for the history of the infarct in the left cerebral hemisphere and left cerebellum: He will continue his follow-up visit and evaluation by  neurology.   5) atrial fibrillation: He is on Eliquis by his cardiologist.  He was advised to call immediately if he has any concerning symptoms in the interval.  The patient voices understanding of current disease status and treatment options and is in agreement with the current care plan.  All questions were answered. The patient knows to call the clinic with any problems, questions or concerns. We can certainly see the patient much sooner if necessary.  Disclaimer: This note was dictated with voice recognition software. Similar sounding words can inadvertently be transcribed and may not be corrected upon review.

## 2016-11-21 NOTE — Patient Instructions (Signed)
[facility] Discharge Instructions for Patients Receiving Chemotherapy  Today you received the following chemotherapy agents:  Keytruda.  To help prevent nausea and vomiting after your treatment, we encourage you to take your nausea medication as prescribed.   If you develop nausea and vomiting that is not controlled by your nausea medication, call the clinic.   BELOW ARE SYMPTOMS THAT SHOULD BE REPORTED IMMEDIATELY:  *FEVER GREATER THAN 100.5 F  *CHILLS WITH OR WITHOUT FEVER  NAUSEA AND VOMITING THAT IS NOT CONTROLLED WITH YOUR NAUSEA MEDICATION  *UNUSUAL SHORTNESS OF BREATH  *UNUSUAL BRUISING OR BLEEDING  TENDERNESS IN MOUTH AND THROAT WITH OR WITHOUT PRESENCE OF ULCERS  *URINARY PROBLEMS  *BOWEL PROBLEMS  UNUSUAL RASH Items with * indicate a potential emergency and should be followed up as soon as possible.  Feel free to call the clinic you have any questions or concerns. The clinic phone number is [phone].  Please show the CHEMO ALERT CARD at check-in to the Emergency Department and triage nurse.   

## 2016-11-21 NOTE — Telephone Encounter (Signed)
Appointments scheduled per 11/30 LOS. Patient given AVS report and calendars with future scheduled appointments. °

## 2016-11-21 NOTE — Telephone Encounter (Signed)
He can get treatment 12/27 or 29

## 2016-11-25 NOTE — Telephone Encounter (Signed)
Doublebook him early morning on 12/27. Thank you.

## 2016-11-26 ENCOUNTER — Telehealth: Payer: Self-pay | Admitting: *Deleted

## 2016-11-26 ENCOUNTER — Other Ambulatory Visit: Payer: Self-pay | Admitting: Cardiothoracic Surgery

## 2016-11-26 DIAGNOSIS — C3490 Malignant neoplasm of unspecified part of unspecified bronchus or lung: Secondary | ICD-10-CM

## 2016-11-26 NOTE — Telephone Encounter (Signed)
I have called and left the patient a message to call the office back. Patient needs MD appt early morning on 12/27, ok to db per dr Vista Mink.

## 2016-11-27 ENCOUNTER — Other Ambulatory Visit: Payer: Self-pay | Admitting: General Surgery

## 2016-11-27 ENCOUNTER — Ambulatory Visit
Admission: RE | Admit: 2016-11-27 | Discharge: 2016-11-27 | Disposition: A | Discharge status: Home / Self Care | Payer: Medicare Other | Source: Ambulatory Visit | Attending: Cardiothoracic Surgery | Admitting: Cardiothoracic Surgery

## 2016-11-27 ENCOUNTER — Ambulatory Visit (INDEPENDENT_AMBULATORY_CARE_PROVIDER_SITE_OTHER): Payer: Medicare Other | Admitting: Cardiothoracic Surgery

## 2016-11-27 ENCOUNTER — Encounter: Payer: Self-pay | Admitting: Cardiothoracic Surgery

## 2016-11-27 VITALS — BP 110/72 | HR 77 | Resp 20 | Ht 69.0 in | Wt 174.0 lb

## 2016-11-27 DIAGNOSIS — Z85118 Personal history of other malignant neoplasm of bronchus and lung: Secondary | ICD-10-CM | POA: Diagnosis not present

## 2016-11-27 DIAGNOSIS — I63422 Cerebral infarction due to embolism of left anterior cerebral artery: Secondary | ICD-10-CM | POA: Diagnosis not present

## 2016-11-27 DIAGNOSIS — C3492 Malignant neoplasm of unspecified part of left bronchus or lung: Secondary | ICD-10-CM | POA: Diagnosis not present

## 2016-11-27 DIAGNOSIS — Z95828 Presence of other vascular implants and grafts: Secondary | ICD-10-CM

## 2016-11-27 DIAGNOSIS — J910 Malignant pleural effusion: Secondary | ICD-10-CM

## 2016-11-27 DIAGNOSIS — C220 Liver cell carcinoma: Secondary | ICD-10-CM

## 2016-11-27 DIAGNOSIS — I63432 Cerebral infarction due to embolism of left posterior cerebral artery: Secondary | ICD-10-CM

## 2016-11-27 DIAGNOSIS — C3490 Malignant neoplasm of unspecified part of unspecified bronchus or lung: Secondary | ICD-10-CM

## 2016-11-27 NOTE — Progress Notes (Signed)
PCP is [name], MD Referring Provider is Curt Bears, MD  Chief Complaint  Patient presents with  . Lung Cancer    3 month f/u with CXR    HPI: 3 month checkup after right Port-A-Cath placement Being actively treated with chemotherapy-immunotherapy for squamous cell carcinoma the left lung by Dr. Inda Merlin No problems with Port-A-Cath Last CT scan and chest x-ray personally reviewed and counseled with patient No evidence of significant recurrence of his malignant disease. Patient scheduled to undergo catheter-based therapy for hepatocellular carcinoma by interventional radiology.  Past Medical History:  Diagnosis Date  . Acute infarction of intestine, part and extent unspecified ([place]) 04/25/2016  . 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 Arthritis   . Atrial fibrillation ([place])   . Constipation   . Depression   . Diabetes mellitus type 2 with retinopathy ([place])   . Diverticulosis of colon   . Dysrhythmia    Atrial fib (found 07/2014)  . Fatty liver 02/29/00   abd ultrasound  . Full code status 09/26/2015  . GERD (gastroesophageal reflux disease)    occasional  . HCAP (healthcare-associated pneumonia) 11/27/2015   IV vanc/zosyn then levaquin courses x2   . Hepatocellular carcinoma ([place]) 08/21/2016  . History of diabetes mellitus, type II    resolved with diet, h/o neuropathy  . History of tobacco use quit 1990s  . Hyperlipidemia   . Hypertension   . Hypertensive retinopathy of both eyes 2015   mild  . Lobar pneumonia ([place]) 10/18/2015  . Lower back pain   . 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 Neuropathy ([place])   . 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 Personal history of colonic adenomas 07/06/2013  . Pneumonia   . 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 Shortness of breath   . 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 Systolic murmur 3419   2Decho - normal LV fxn, EF 55%, mild AS, biatrial enlargement    Past Surgical History:  Procedure Laterality Date  . CATARACT EXTRACTION Right 03/2015   Dr [name]  . CHEST TUBE INSERTION Left 08/26/2014   Procedure: INSERTION OF LEFT  PLEURAL DRAINAGE CATHETER;  Surgeon: Ivin Poot, MD;  Location: [facility];  Service: Thoracic;  Laterality: Left;  . COLONOSCOPY  2004  . COLONOSCOPY  2014   tubular adenoma x1, mod diverticulosis ([place])  . FLEXIBLE BRONCHOSCOPY  02/2014   WNL  . PORTACATH PLACEMENT Right 03/31/2015   Procedure: INSERTION PORT-A-CATH;  Surgeon: Ivin Poot, MD;  Location: [facility];  Service: Thoracic;  Laterality: Right;  . REMOVAL OF PLEURAL DRAINAGE CATHETER Left 03/31/2015   Procedure: REMOVAL OF PLEURAL DRAINAGE CATHETER;  Surgeon: Ivin Poot, MD;  Location: [facility];  Service: Thoracic;  Laterality: Left;  Marland Kitchen VIDEO BRONCHOSCOPY Bilateral 03/17/2014   Procedure: VIDEO BRONCHOSCOPY WITH FLUORO;  Surgeon: Tanda Rockers, MD;  Location: [facility];  Service: Cardiopulmonary;  Laterality: Bilateral;    Family History  Problem Relation Age of Onset  . Stroke Mother     multiple mini strokes  . Hypertension Mother   . Prostate cancer Father   . Heart disease Maternal Grandfather     MI  . Stroke Paternal Grandmother     Social History Social History  Substance Use Topics  . Smoking status: Former Smoker  Packs/day: 3.00    Years: 30.00    Types: Cigarettes    Quit date: 12/23/1988  . Smokeless tobacco: Never Used  . Alcohol use 12.6 oz/week    21 Glasses of wine per week     Comment: 3-4 wine a day ( 8/03- 2 beers, 2 wines, 2 brandies daily)    Current Outpatient Prescriptions  Medication Sig Dispense Refill  . acetaminophen (TYLENOL) 500 MG tablet Take 500 mg by mouth 2 (two) times daily. Reported on 05/06/2016    . albuterol (PROVENTIL HFA;VENTOLIN HFA) 108 (90 BASE) MCG/ACT inhaler Inhale 2 puffs into the lungs every  6 (six) hours as needed for wheezing or shortness of breath. 1 Inhaler 3  . benzonatate (TESSALON) 100 MG capsule Take 1-2 capsules (100-200 mg total) by mouth 3 (three) times daily as needed for cough. 40 capsule 1  . ELIQUIS 5 MG TABS tablet TAKE ONE TABLET BY MOUTH TWICE DAILY 180 tablet 1  . glipiZIDE (GLUCOTROL) 5 MG tablet Take 1 tablet (5 mg total) by mouth daily before breakfast. 30 tablet 6  . glucose blood (ONE TOUCH ULTRA TEST) test strip Use to check sugar once daily and as needed. Dx: E09.9 100 each 3  . guaiFENesin (MUCINEX) 600 MG 12 hr tablet Take 600 mg by mouth 2 (two) times daily as needed for cough or to loosen phlegm.     . hydrocortisone (ANUSOL-HC) 25 MG suppository Place 1 suppository (25 mg total) rectally 2 (two) times daily. (Patient taking differently: Place 25 mg rectally 2 (two) times daily as needed for hemorrhoids or itching. ) 12 suppository 0  . KLOR-CON M10 10 MEQ tablet Take 2 tablets (20 mEq total) by mouth every other day. 30 tablet 6  . lidocaine-prilocaine (EMLA) cream Apply 1 application topically as needed. (Patient taking differently: Apply 1 application topically as needed (for port access). ) 30 g 1  . methocarbamol (ROBAXIN) 500 MG tablet Take 500 mg by mouth 3 (three) times daily as needed. Muscle pain    . metoprolol tartrate (LOPRESSOR) 50 MG tablet Take 0.5 tablets (25 mg total) by mouth 2 (two) times daily. 30 tablet 6  . Multiple Vitamins-Minerals (ICAPS) CAPS Take 1 capsule by mouth 2 (two) times daily.     [name] DELICA LANCETS 00Q MISC Use to check sugar once daily and as needed. Dx:E09.9 100 each 3  . polyethylene glycol (MIRALAX / GLYCOLAX) packet Take 8.5 g by mouth daily.     Marland Kitchen spironolactone (ALDACTONE) 50 MG tablet Take 1 tablet (50 mg total) by mouth daily.    . sucralfate (CARAFATE) 1 g tablet Take 1 tablet (1 g total) by mouth 3 (three) times daily before meals. (Patient taking differently: Take 1 g by mouth 3 (three) times daily as  needed. ) 90 tablet 1  . torsemide (DEMADEX) 20 MG tablet Take 1 tablet (20 mg total) by mouth every other day. 30 tablet 6  . traMADol (ULTRAM) 50 MG tablet Take 1 tablet (50 mg total) by mouth every 6 (six) hours as needed. (Patient taking differently: Take 50 mg by mouth every 6 (six) hours as needed for moderate pain. ) 30 tablet 3   No current facility-administered medications for this visit.     Allergies  Allergen Reactions  . Statins Other (See Comments)    All statins cause muscle aches  . Diltiazem Hcl Other (See Comments)    Dizziness  . Doxazosin Mesylate Other (See Comments)    Headaches   .  Hydrocodone Other (See Comments)    constipation  . Nifedipine Swelling    Leg swelling  . Red Yeast Rice Other (See Comments)    Muscle aches  . Sertraline Other (See Comments)    oversedation    Review of Systems  No acute illness symptoms Pain in the area of the right medial trapezius Generally dry itchy skin Weight stable Stable dyspnea on exertion  BP 110/72   Pulse 77   Resp 20   Ht '5\' 9"'$  (1.753 m)   Wt 174 lb (78.9 kg)   SpO2 98% Comment: RA  BMI 25.70 kg/m  Physical Exam      Exam    General- alert and comfortable   Lungs- clear without rales, wheezes   Cor- regular rate and rhythm, no murmur , gallop   Abdomen- soft, non-tender   Extremities - warm, non-tender, minimal edema   Neuro- oriented, appropriate, no focal weakness   Diagnostic Tests: Chest x-ray clear  Impression: Patient doing well with chemotherapy for treatment of his advanced stage lung cancer No surgical issues-Port-A-Cath functioning  Plan: Return for review in 3 months   Len Childs, MD [facility] [phone]

## 2016-11-28 ENCOUNTER — Encounter (HOSPITAL_COMMUNITY)
Admission: RE | Admit: 2016-11-28 | Discharge: 2016-11-28 | Disposition: A | Discharge status: Home / Self Care | Payer: Medicare Other | Source: Ambulatory Visit | Attending: Interventional Radiology | Admitting: Interventional Radiology

## 2016-11-28 ENCOUNTER — Encounter (HOSPITAL_COMMUNITY): Payer: Self-pay

## 2016-11-28 ENCOUNTER — Other Ambulatory Visit: Payer: Self-pay | Admitting: Interventional Radiology

## 2016-11-28 ENCOUNTER — Ambulatory Visit (HOSPITAL_COMMUNITY)
Admission: RE | Admit: 2016-11-28 | Discharge: 2016-11-28 | Disposition: A | Discharge status: Home / Self Care | Payer: Medicare Other | Source: Ambulatory Visit | Attending: Interventional Radiology | Admitting: Interventional Radiology

## 2016-11-28 ENCOUNTER — Telehealth: Payer: Self-pay | Admitting: Internal Medicine

## 2016-11-28 DIAGNOSIS — C220 Liver cell carcinoma: Secondary | ICD-10-CM

## 2016-11-28 DIAGNOSIS — I10 Essential (primary) hypertension: Secondary | ICD-10-CM | POA: Insufficient documentation

## 2016-11-28 DIAGNOSIS — E11319 Type 2 diabetes mellitus with unspecified diabetic retinopathy without macular edema: Secondary | ICD-10-CM | POA: Diagnosis not present

## 2016-11-28 DIAGNOSIS — C3492 Malignant neoplasm of unspecified part of left bronchus or lung: Secondary | ICD-10-CM | POA: Diagnosis not present

## 2016-11-28 DIAGNOSIS — Z87891 Personal history of nicotine dependence: Secondary | ICD-10-CM | POA: Insufficient documentation

## 2016-11-28 DIAGNOSIS — I4891 Unspecified atrial fibrillation: Secondary | ICD-10-CM | POA: Insufficient documentation

## 2016-11-28 DIAGNOSIS — Z8673 Personal history of transient ischemic attack (TIA), and cerebral infarction without residual deficits: Secondary | ICD-10-CM | POA: Diagnosis not present

## 2016-11-28 DIAGNOSIS — F329 Major depressive disorder, single episode, unspecified: Secondary | ICD-10-CM | POA: Insufficient documentation

## 2016-11-28 DIAGNOSIS — K219 Gastro-esophageal reflux disease without esophagitis: Secondary | ICD-10-CM | POA: Insufficient documentation

## 2016-11-28 DIAGNOSIS — I700 Atherosclerosis of aorta: Secondary | ICD-10-CM | POA: Insufficient documentation

## 2016-11-28 DIAGNOSIS — Z79899 Other long term (current) drug therapy: Secondary | ICD-10-CM | POA: Insufficient documentation

## 2016-11-28 DIAGNOSIS — E785 Hyperlipidemia, unspecified: Secondary | ICD-10-CM | POA: Insufficient documentation

## 2016-11-28 DIAGNOSIS — C228 Malignant neoplasm of liver, primary, unspecified as to type: Secondary | ICD-10-CM | POA: Diagnosis not present

## 2016-11-28 HISTORY — PX: IR GENERIC HISTORICAL: IMG1180011

## 2016-11-28 LAB — CBC WITH DIFFERENTIAL/PLATELET
Basophils Absolute: 0 10*3/uL (ref 0.0–0.1)
Basophils Relative: 1 %
EOS PCT: 2 %
Eosinophils Absolute: 0.1 10*3/uL (ref 0.0–0.7)
HCT: 50.4 % (ref 39.0–52.0)
Hemoglobin: 16.1 g/dL (ref 13.0–17.0)
LYMPHS ABS: 0.8 10*3/uL (ref 0.7–4.0)
LYMPHS PCT: 13 %
MCH: 27.9 pg (ref 26.0–34.0)
MCHC: 31.9 g/dL (ref 30.0–36.0)
MCV: 87.3 fL (ref 78.0–100.0)
MONO ABS: 0.5 10*3/uL (ref 0.1–1.0)
Monocytes Relative: 9 %
Neutro Abs: 4.4 10*3/uL (ref 1.7–7.7)
Neutrophils Relative %: 75 %
PLATELETS: 217 10*3/uL (ref 150–400)
RBC: 5.77 MIL/uL (ref 4.22–5.81)
RDW: 16.9 % — ABNORMAL HIGH (ref 11.5–15.5)
WBC: 5.9 10*3/uL (ref 4.0–10.5)

## 2016-11-28 LAB — COMPREHENSIVE METABOLIC PANEL
ALT: 56 U/L (ref 17–63)
ANION GAP: 10 (ref 5–15)
AST: 75 U/L — ABNORMAL HIGH (ref 15–41)
Albumin: 3.5 g/dL (ref 3.5–5.0)
Alkaline Phosphatase: 184 U/L — ABNORMAL HIGH (ref 38–126)
BUN: 36 mg/dL — AB (ref 6–20)
CO2: 23 mmol/L (ref 22–32)
Calcium: 8.6 mg/dL — ABNORMAL LOW (ref 8.9–10.3)
Chloride: 103 mmol/L (ref 101–111)
Creatinine, Ser: 1.36 mg/dL — ABNORMAL HIGH (ref 0.61–1.24)
GFR, EST AFRICAN AMERICAN: 57 mL/min — AB (ref >60)
GFR, EST NON AFRICAN AMERICAN: 49 mL/min — AB (ref >60)
GLUCOSE: 155 mg/dL — AB (ref 65–99)
POTASSIUM: 4.6 mmol/L (ref 3.5–5.1)
Sodium: 136 mmol/L (ref 135–145)
Total Bilirubin: 1.8 mg/dL — ABNORMAL HIGH (ref 0.3–1.2)
Total Protein: 6.2 g/dL — ABNORMAL LOW (ref 6.5–8.1)

## 2016-11-28 LAB — PROTIME-INR
INR: 1.02
Prothrombin Time: 13.5 seconds (ref 11.4–15.2)

## 2016-11-28 LAB — GLUCOSE, CAPILLARY: GLUCOSE-CAPILLARY: 132 mg/dL — AB (ref 65–99)

## 2016-11-28 MED ORDER — ACETAMINOPHEN 325 MG PO TABS
650.0000 mg | ORAL_TABLET | ORAL | Status: DC | PRN
Start: 2016-11-28 — End: 2016-11-29

## 2016-11-28 MED ORDER — TECHNETIUM TO 99M ALBUMIN AGGREGATED
5.4000 | Freq: Once | INTRAVENOUS | Status: AC | PRN
  Administered 2016-11-28: 5.4 via INTRAVENOUS

## 2016-11-28 MED ORDER — HEPARIN SODIUM (PORCINE) 1000 UNIT/ML IJ SOLN
INTRAMUSCULAR | Status: AC | PRN
  Administered 2016-11-28: 3000 [IU] via INTRA_ARTERIAL

## 2016-11-28 MED ORDER — SODIUM CHLORIDE 0.9 % IV SOLN: 250.0000 mL | INTRAVENOUS | Status: DC | PRN

## 2016-11-28 MED ORDER — SODIUM CHLORIDE 0.9 % WEIGHT BASED INFUSION: 1.0000 mL/kg/h | INTRAVENOUS | Status: AC

## 2016-11-28 MED ORDER — MIDAZOLAM HCL 2 MG/2ML IJ SOLN
INTRAMUSCULAR | Status: AC
  Filled 2016-11-28: qty 6

## 2016-11-28 MED ORDER — LIDOCAINE HCL 1 % IJ SOLN
INTRAMUSCULAR | Status: AC | PRN
  Administered 2016-11-28: 10 mL

## 2016-11-28 MED ORDER — SODIUM CHLORIDE 0.9 % IV SOLN: INTRAVENOUS | Status: DC

## 2016-11-28 MED ORDER — ONDANSETRON HCL 4 MG/2ML IJ SOLN: 4.0000 mg | Freq: Four times a day (QID) | INTRAMUSCULAR | Status: DC | PRN

## 2016-11-28 MED ORDER — SODIUM CHLORIDE 0.9% FLUSH: 3.0000 mL | Freq: Two times a day (BID) | INTRAVENOUS | Status: DC

## 2016-11-28 MED ORDER — VERAPAMIL HCL 2.5 MG/ML IV SOLN
INTRAVENOUS | Status: AC
  Filled 2016-11-28: qty 2

## 2016-11-28 MED ORDER — MIDAZOLAM HCL 2 MG/2ML IJ SOLN
INTRAMUSCULAR | Status: AC | PRN
  Administered 2016-11-28 (×5): 1 mg via INTRAVENOUS

## 2016-11-28 MED ORDER — FENTANYL CITRATE (PF) 100 MCG/2ML IJ SOLN
INTRAMUSCULAR | Status: AC
  Filled 2016-11-28: qty 4

## 2016-11-28 MED ORDER — NITROGLYCERIN 1 MG/10 ML FOR IR/CATH LAB
INTRA_ARTERIAL | Status: AC | PRN
  Administered 2016-11-28 (×2): 200 ug via INTRA_ARTERIAL

## 2016-11-28 MED ORDER — HEPARIN SOD (PORK) LOCK FLUSH 100 UNIT/ML IV SOLN
500.0000 [IU] | INTRAVENOUS | Status: AC | PRN
  Administered 2016-11-28: 500 [IU]
  Filled 2016-11-28: qty 5

## 2016-11-28 MED ORDER — PIPERACILLIN-TAZOBACTAM 3.375 G IVPB
3.3750 g | INTRAVENOUS | Status: AC
  Administered 2016-11-28: 3.375 g via INTRAVENOUS
  Filled 2016-11-28: qty 50

## 2016-11-28 MED ORDER — HYDRALAZINE HCL 20 MG/ML IJ SOLN: 5.0000 mg | INTRAMUSCULAR | Status: AC | PRN

## 2016-11-28 MED ORDER — IOPAMIDOL (ISOVUE-300) INJECTION 61%
80.0000 mL | Freq: Once | INTRAVENOUS | Status: AC | PRN
  Administered 2016-11-28: 80 mL via INTRA_ARTERIAL

## 2016-11-28 MED ORDER — VERAPAMIL HCL 2.5 MG/ML IV SOLN
INTRAVENOUS | Status: AC | PRN
  Administered 2016-11-28: 2.5 mg via INTRA_ARTERIAL

## 2016-11-28 MED ORDER — LABETALOL HCL 5 MG/ML IV SOLN
10.0000 mg | INTRAVENOUS | Status: AC | PRN
  Filled 2016-11-28: qty 4

## 2016-11-28 MED ORDER — FENTANYL CITRATE (PF) 100 MCG/2ML IJ SOLN
INTRAMUSCULAR | Status: AC | PRN
  Administered 2016-11-28: 50 ug via INTRAVENOUS
  Administered 2016-11-28 (×2): 25 ug via INTRAVENOUS

## 2016-11-28 MED ORDER — SODIUM CHLORIDE 0.9% FLUSH
3.0000 mL | INTRAVENOUS | Status: DC | PRN
  Administered 2016-11-28: 10 mL via INTRAVENOUS
  Filled 2016-11-28: qty 3

## 2016-11-28 MED ORDER — LIDOCAINE HCL 1 % IJ SOLN
INTRAMUSCULAR | Status: AC
  Filled 2016-11-28: qty 20

## 2016-11-28 MED ORDER — HEPARIN SODIUM (PORCINE) 1000 UNIT/ML IJ SOLN
INTRAMUSCULAR | Status: AC
  Filled 2016-11-28: qty 1

## 2016-11-28 MED ORDER — IOPAMIDOL (ISOVUE-300) INJECTION 61%
INTRAVENOUS | Status: AC
  Administered 2016-11-28: 80 mL via INTRA_ARTERIAL
  Filled 2016-11-28: qty 300

## 2016-11-28 NOTE — Procedures (Signed)
Interventional Radiology Procedure Note  Procedure: FEX-M14 mapping study with coil embo GDA and RGA.  Access: Left radial 66F --> RadBand with 9 cc applied 70:92  Complications: None  Estimated Blood Loss: None  Recommendations: - To Nucs for imaging - RadBand per protocol - DC home - Right lobar   Signed,  Criselda Peaches, MD

## 2016-11-28 NOTE — Progress Notes (Signed)
3cc air removed from wrist band.

## 2016-11-28 NOTE — Discharge Instructions (Signed)
Femoral Site Care Introduction Refer to this sheet in the next few weeks. These instructions provide you with information about caring for yourself after your procedure. Your health care provider may also give you more specific instructions. Your treatment has been planned according to current medical practices, but problems sometimes occur. Call your health care provider if you have any problems or questions after your procedure. What can I expect after the procedure? After your procedure, it is typical to have the following:  Bruising at the site that usually fades within 1-2 weeks.  Blood collecting in the tissue (hematoma) that may be painful to the touch. It should usually decrease in size and tenderness within 1-2 weeks. Follow these instructions at home:  Take medicines only as directed by your health care provider.  You may shower 24-48 hours after the procedure or as directed by your health care provider. Remove the bandage (dressing) and gently wash the site with plain soap and water. Pat the area dry with a clean towel. Do not rub the site, because this may cause bleeding.  Do not take baths, swim, or use a hot tub until your health care provider approves.  Check your insertion site every day for redness, swelling, or drainage.  Do not apply powder or lotion to the site.  Limit use of stairs to twice a day for the first 2-3 days or as directed by your health care provider.  Do not squat for the first 2-3 days or as directed by your health care provider.  Do not lift over 10 lb (4.5 kg) for 5 days after your procedure or as directed by your health care provider.  Ask your health care provider when it is okay to:  Return to work or school.  Resume usual physical activities or sports.  Resume sexual activity.  Do not drive home if you are discharged the same day as the procedure. Have someone else drive you.  You may drive 24 hours after the procedure unless otherwise  instructed by your health care provider.  Do not operate machinery or power tools for 24 hours after the procedure or as directed by your health care provider.  If your procedure was done as an outpatient procedure, which means that you went home the same day as your procedure, a responsible adult should be with you for the first 24 hours after you arrive home.  Keep all follow-up visits as directed by your health care provider. This is important. Contact a health care provider if:  You have a fever.  You have chills.  You have increased bleeding from the site. Hold pressure on the site. Get help right away if:  You have unusual pain at the site.  You have redness, warmth, or swelling at the site.  You have drainage (other than a small amount of blood on the dressing) from the site.  The site is bleeding, and the bleeding does not stop after 30 minutes of holding steady pressure on the site.  Your leg or foot becomes pale, cool, tingly, or numb. This information is not intended to replace advice given to you by your health care provider. Make sure you discuss any questions you have with your health care provider. Document Released: 08/12/2014 Document Revised: 05/16/2016 Document Reviewed: 06/28/2014  2017 Elsevier Moderate Conscious Sedation, Adult Sedation is the use of medicines to promote relaxation and relieve discomfort and anxiety. Moderate conscious sedation is a type of sedation. Under moderate conscious sedation, you are less  alert than normal, but you are still able to respond to instructions, touch, or both. Moderate conscious sedation is used during short medical and dental procedures. It is milder than deep sedation, which is a type of sedation under which you cannot be easily woken up. It is also milder than general anesthesia, which is the use of medicines to make you unconscious. Moderate conscious sedation allows you to return to your regular activities sooner. Tell  a health care provider about:  Any allergies you have.  All medicines you are taking, including vitamins, herbs, eye drops, creams, and over-the-counter medicines.  Use of steroids (by mouth or creams).  Any problems you or family members have had with sedatives and anesthetic medicines.  Any blood disorders you have.  Any surgeries you have had.  Any medical conditions you have, such as sleep apnea.  Whether you are pregnant or may be pregnant.  Any use of cigarettes, alcohol, marijuana, or street drugs. What are the risks? Generally, this is a safe procedure. However, problems may occur, including:  Getting too much medicine (oversedation).  Nausea.  Allergic reaction to medicines.  Trouble breathing. If this happens, a breathing tube may be used to help with breathing. It will be removed when you are awake and breathing on your own.  Heart trouble.  Lung trouble. What happens before the procedure? Staying hydrated  Follow instructions from your health care provider about hydration, which may include:  Up to 2 hours before the procedure - you may continue to drink clear liquids, such as water, clear fruit juice, black coffee, and plain tea. Eating and drinking restrictions  Follow instructions from your health care provider about eating and drinking, which may include:  8 hours before the procedure - stop eating heavy meals or foods such as meat, fried foods, or fatty foods.  6 hours before the procedure - stop eating light meals or foods, such as toast or cereal.  6 hours before the procedure - stop drinking milk or drinks that contain milk.  2 hours before the procedure - stop drinking clear liquids. Medicine  Ask your health care provider about:  Changing or stopping your regular medicines. This is especially important if you are taking diabetes medicines or blood thinners.  Taking medicines such as aspirin and ibuprofen. These medicines can thin your blood.  Do not take these medicines before your procedure if your health care provider instructs you not to. Tests and exams  You will have a physical exam.  You may have blood tests done to show:  How well your kidneys and liver are working.  How well your blood can clot. General instructions  Plan to have someone take you home from the hospital or clinic.  If you will be going home right after the procedure, plan to have someone with you for 24 hours. What happens during the procedure?  An IV tube will be inserted into one of your veins.  Medicine to help you relax (sedative) will be given through the IV tube.  The medical or dental procedure will be performed. What happens after the procedure?  Your blood pressure, heart rate, breathing rate, and blood oxygen level will be monitored often until the medicines you were given have worn off.  Do not drive for 24 hours. This information is not intended to replace advice given to you by your health care provider. Make sure you discuss any questions you have with your health care provider. Document Released: 09/03/2001 Document Revised:  05/14/2016 Document Reviewed: 03/30/2016 Elsevier Interactive Patient Education  2017 [employer].

## 2016-11-28 NOTE — Telephone Encounter (Signed)
Patient's wife came into Scheduling today to inquire about changes made to her husband's treatment, plan without their knowledge. She stated that she discovered this change via MyChart and over a week ago and has been calling [place] to inquire and that her calls/messages was not returned. She also stated that there was no follow up appointments scheduled with Dr [name] and that Shawn Espinoza are to see Dr [name] every 3 weeks with treatment. I spoke with Shawn Espinoza, the Chemo scheduler and the Infusion appt time was adjusted, with the follow up appointments added. 11/28/16. A copy of the appointment schedule was mailed to the patient and a voice mail was also left.

## 2016-11-28 NOTE — H&P (Signed)
Chief Complaint: Hepatocellular Carcinoma  Referring Physician(s): Curt Bears  Supervising Physician: Jacqulynn Cadet  Patient Status: [facility] - [facility]  History of Present Illness: Shawn Espino[facility] is a 75 y.o. 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  At the time of diagnosis there was a solitary liver lesion which was initially thought to be metastatic lung adenocarcinoma.  The liver lesion demonstrated significant and the rapid progression into multifocal disease between March and August 2017.   Given the relatively rapid change, ultrasound-guided biopsy was performed and came back as hepatocellular carcinoma.   Shawn Espinoza saw Dr. Laurence Ferrari in clinic on 11/12/2016 to discuss targeted liver therapy.   He is not a candidate for resection. He has extensive disease throughout the majority of the right hepatic lobe and has a single lesion in the left hepatic lobe.   Surprisingly, he is clinically asymptomatic. He denies right upper quadrant pain although he does have fatigue, muscle aches and mild malaise.   Given the circumstances he is in remarkably good spirits and has a positive outlook.  He does drink alcohol usually 1-2 glasses of wine daily.    Dr. Laurence Ferrari discussed the need for cessation while he undergoes any potential liver directed therapy.  He feels well today. No fever/Chill. He takes Eliquis for A-fib and has held this x 48 hours as instructed.  Past Medical History:  Diagnosis Date  . Acute infarction of intestine, part and extent unspecified ([place]) 04/25/2016  . 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 Arthritis   . Atrial fibrillation ([place])   . Constipation   . Depression   . Diabetes mellitus type 2 with retinopathy ([place])   . Diverticulosis of colon   . Dysrhythmia    Atrial fib (found 07/2014)  . Fatty liver 02/29/00   abd ultrasound  .  Full code status 09/26/2015  . GERD (gastroesophageal reflux disease)    occasional  . HCAP (healthcare-associated pneumonia) 11/27/2015   IV vanc/zosyn then levaquin courses x2   . Hepatocellular carcinoma ([place]) 08/21/2016  . History of diabetes mellitus, type II    resolved with diet, h/o neuropathy  . History of tobacco use quit 1990s  . Hyperlipidemia   . Hypertension   . Hypertensive retinopathy of both eyes 2015   mild  . Lobar pneumonia ([place]) 10/18/2015  . Lower back pain   . 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 Neuropathy ([place])   . Non-small cell carcinoma of left lung ([place]) 02/2014   stage IIIb/IV on chemo  . Personal history of colonic adenomas 07/06/2013  . Pneumonia   . 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 Shortness of breath   . 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 Systolic murmur 7619   2Decho - normal LV fxn, EF 55%, mild AS, biatrial enlargement    Past Surgical History:  Procedure Laterality Date  . CATARACT EXTRACTION Right 03/2015   Dr [name]  . CHEST TUBE INSERTION Left 08/26/2014   Procedure: INSERTION OF LEFT  PLEURAL DRAINAGE CATHETER;  Surgeon: Ivin Poot, MD;  Location: [facility];  Service: Thoracic;  Laterality: Left;  . COLONOSCOPY  2004  . COLONOSCOPY  2014   tubular adenoma x1, mod diverticulosis ([place])  . FLEXIBLE BRONCHOSCOPY  02/2014   WNL  . PORTACATH PLACEMENT Right 03/31/2015  Procedure: INSERTION PORT-A-CATH;  Surgeon: Ivin Poot, MD;  Location: [place];  Service: Thoracic;  Laterality: Right;  . REMOVAL OF PLEURAL DRAINAGE CATHETER Left 03/31/2015   Procedure: REMOVAL OF PLEURAL DRAINAGE CATHETER;  Surgeon: Ivin Poot, MD;  Location: [place];  Service: Thoracic;  Laterality: Left;  Marland Kitchen VIDEO BRONCHOSCOPY Bilateral 03/17/2014   Procedure: VIDEO BRONCHOSCOPY WITH FLUORO;  Surgeon: Tanda Rockers, MD;  Location: [facility];  Service: Cardiopulmonary;  Laterality:  Bilateral;    Allergies: Statins; Diltiazem hcl; Doxazosin mesylate; Hydrocodone; Nifedipine; Red yeast rice; and Sertraline  Medications: Prior to Admission medications   Medication Sig Start Date End Date Taking? Authorizing Provider  glipiZIDE (GLUCOTROL) 5 MG tablet Take 1 tablet (5 mg total) by mouth daily before breakfast. 11/13/16  Yes [name], MD  guaiFENesin (MUCINEX) 600 MG 12 hr tablet Take 600 mg by mouth 2 (two) times daily as needed for cough or to loosen phlegm.    Yes Historical Provider, MD  KLOR-CON M10 10 MEQ tablet Take 2 tablets (20 mEq total) by mouth every other day. 08/06/16  Yes [name], MD  lidocaine-prilocaine (EMLA) cream Apply 1 application topically as needed. Patient taking differently: Apply 1 application topically as needed (for port access).  05/08/16  Yes Curt Bears, MD  methocarbamol (ROBAXIN) 500 MG tablet Take 500 mg by mouth 3 (three) times daily as needed. Muscle pain 05/06/16  Yes [name], MD  metoprolol tartrate (LOPRESSOR) 50 MG tablet Take 0.5 tablets (25 mg total) by mouth 2 (two) times daily. 05/17/16  Yes [name], MD  Multiple Vitamins-Minerals (ICAPS) CAPS Take 1 capsule by mouth 2 (two) times daily.    Yes Historical Provider, MD  polyethylene glycol (MIRALAX / GLYCOLAX) packet Take 8.5 g by mouth daily.    Yes Historical Provider, MD  spironolactone (ALDACTONE) 50 MG tablet Take 1 tablet (50 mg total) by mouth daily. 08/06/16  Yes [name], MD  torsemide (DEMADEX) 20 MG tablet Take 1 tablet (20 mg total) by mouth every other day. 08/06/16  Yes [name], MD  traMADol (ULTRAM) 50 MG tablet Take 1 tablet (50 mg total) by mouth every 6 (six) hours as needed. Patient taking differently: Take 50 mg by mouth every 6 (six) hours as needed for moderate pain.  05/08/16  Yes Curt Bears, MD  acetaminophen (TYLENOL) 500 MG tablet Take 500 mg by mouth 2 (two) times daily. Reported on 05/06/2016     Historical Provider, MD  albuterol (PROVENTIL HFA;VENTOLIN HFA) 108 (90 BASE) MCG/ACT inhaler Inhale 2 puffs into the lungs every 6 (six) hours as needed for wheezing or shortness of breath. 09/08/15   Carlton Adam, PA-C  benzonatate (TESSALON) 100 MG capsule Take 1-2 capsules (100-200 mg total) by mouth 3 (three) times daily as needed for cough. 09/02/16   Tonia Ghent, MD  ELIQUIS 5 MG TABS tablet TAKE ONE TABLET BY MOUTH TWICE DAILY 10/31/16   [name], MD  glucose blood (ONE TOUCH ULTRA TEST) test strip Use to check sugar once daily and as needed. Dx: E09.9 05/17/16   [name], MD  hydrocortisone (ANUSOL-HC) 25 MG suppository Place 1 suppository (25 mg total) rectally 2 (two) times daily. Patient taking differently: Place 25 mg rectally 2 (two) times daily as needed for hemorrhoids or itching.  07/26/15   Jearld Fenton, NP  [facility] DELICA LANCETS 44I MISC Use to check sugar once daily and as needed. Dx:E09.9 05/17/16   [name], MD  sucralfate (CARAFATE) 1 g tablet Take 1 tablet (1 g total) by mouth 3 (three) times daily before meals. Patient taking differently: Take 1 g by mouth 3 (three) times daily as needed.  02/13/16   [name], MD     Family History  Problem Relation Age of Onset  . Stroke Mother     multiple mini strokes  . Hypertension Mother   . Prostate cancer Father   . Heart disease Maternal Grandfather     MI  . Stroke Paternal Grandmother     Social History   Social History  . Marital status: Married    Spouse name: N/A  . Number of children: 2  . Years of education: Bachelors   Occupational History  . [employer], picks up golf balls.  [employer]    Social History Main Topics  . Smoking status: Former Smoker    Packs/day: 3.00    Years: 30.00    Types: Cigarettes    Quit date: 12/23/1988  . Smokeless tobacco: Never Used  . Alcohol use 12.6 oz/week    21 Glasses of wine per week     Comment: 3-4 wine a day ( 8/03- 2 beers, 2 wines,  2 brandies daily)  . Drug use: No  . Sexual activity: No   Other Topics Concern  . None   Social History Narrative   Lives with wife.   [employer], failed eye exam, quit   Activity: active at gym regularly, golfing   Diet: healthy - oatmeal, avoids white starches   Right-handed.   1-2 cups caffeine per day.      Advanced directives: Full code. Wouldn't want prolonged artificial life support. Wife would be HCproxy     Review of Systems: A 12 point ROS discussed  Review of Systems  Constitutional: Negative.   HENT: Negative.   Respiratory: Negative.   Cardiovascular: Negative.   Gastrointestinal: Negative.   Genitourinary: Negative.   Musculoskeletal: Negative.   Skin: Negative.   Neurological: Negative.   Hematological: Negative.   Psychiatric/Behavioral: Negative.     Vital Signs: T 97.5 142/85 Pulse 74  Physical Exam  Constitutional: He is oriented to person, place, and time. He appears well-developed and well-nourished.  HENT:  Head: Normocephalic and atraumatic.  Eyes: EOM are normal.  Neck: Normal range of motion.  Cardiovascular: Normal rate, regular rhythm and normal heart sounds.   No murmur heard. Pulmonary/Chest: Effort normal and breath sounds normal. No respiratory distress. He has no wheezes.  Abdominal: Soft. He exhibits no distension. There is no tenderness.  Musculoskeletal: Normal range of motion.  Neurological: He is alert and oriented to person, place, and time.  Skin: Skin is warm and dry.  Psychiatric: He has a normal mood and affect. His behavior is normal. Judgment and thought content normal.  Vitals reviewed. Nickolas Madrid test was good with rapid return of waveform after compression of the radial artery (2 seconds)  Mallampati Score:  MD Evaluation Airway: WNL Heart: WNL Abdomen: WNL Chest/ Lungs: WNL ASA  Classification: 3 Mallampati/Airway Score: One  Imaging: Dg Chest 2 View  Result Date: 11/27/2016 CLINICAL DATA:  History of lung  carcinoma. History of pleural effusion. EXAM: CHEST  2 VIEW COMPARISON:  CT, 10/29/2016 chest radiographs, 12/28/2015 FINDINGS: The cardiac silhouette is normal in size and configuration. No mediastinal or hilar masses. No convincing adenopathy. Lungs are mildly hyperexpanded but clear. The opacities noted in the left lung base on the prior chest radiograph have essentially resolved. No pleural effusion.  No pneumothorax. Right anterior chest wall Port-A-Cath is well positioned and stable. Skeletal structures are demineralized but intact. IMPRESSION: 1. No acute cardiopulmonary disease. 2. No pleural effusion. Electronically Signed   By: Lajean Manes M.D.   On: 11/27/2016 10:49   Ct Chest W Contrast  Result Date: 10/29/2016 CLINICAL DATA:  Metastatic lung cancer with liver metastases. EXAM: CT CHEST, ABDOMEN, AND PELVIS WITH CONTRAST TECHNIQUE: Multidetector CT imaging of the chest, abdomen and pelvis was performed following the standard protocol during bolus administration of intravenous contrast. CONTRAST:  193m ISOVUE-300 IOPAMIDOL (ISOVUE-300) INJECTION 61% COMPARISON:  07/29/2016 FINDINGS: CT CHEST FINDINGS Cardiovascular: Heart size appears normal. No pericardial effusion. Aortic atherosclerosis. Calcification within the LAD coronary artery noted. Mediastinum/Nodes: The trachea appears patent and is midline. Normal appearance of the esophagus. Pre-vascular node measures 1.3 cm, image 22 of series 2. Previously 2.0 cm. Right hilar node measures 2.1 x 1.9 cm, image 28 of series 2. Previously 3.6 x 3.2 cm. Lungs/Pleura: No pleural fluid. Moderate changes of centrilobular emphysema. The triangular-shaped density within the periphery of the left lower lobe measures 1 x 0.6 cm, image 83 of series 5. Previously 0.7 x 0.6 cm. Stable scarring and fibrosis involving the superior segment of left lower lobe. Musculoskeletal: There is a small subpleural nodule within the left lower lobe measuring 4 mm, image number  95 of series 5. Unchanged from previous exam. CT ABDOMEN PELVIS FINDINGS Hepatobiliary: Large confluent mass involving the right lobe of liver is again noted and appears ill defined measuring 10.9 x 17.3 by 18.2 cm. Previously this measured 9.5 x 16.1 by 9.2 cm. Index lesion in the lateral segment of left lobe measures 2.4 by 1.5 cm. New from previous exam. Stones and/or sludge noted within the dependent portion of the gallbladder. Pancreas: Unremarkable. No pancreatic ductal dilatation or surrounding inflammatory changes. Spleen: Normal in size without focal abnormality. Adrenals/Urinary Tract: Adrenal glands are unremarkable. Kidneys are normal, without renal calculi, focal lesion, or hydronephrosis. Bladder is unremarkable. Stomach/Bowel: The stomach appears normal. The small bowel loops have a normal course and caliber. The appendix is visualized and appears normal. Unremarkable appearance of the colon. Vascular/Lymphatic: Aortic atherosclerosis. No aneurysm. Portacaval lymph node measures 1.7 cm, image 66 of series 2. On the previous exam this measured 1 cm. Reproductive: Prostate gland appears enlarged. Other: There is no ascites or focal fluid collections within the abdomen or pelvis. Musculoskeletal: No aggressive lytic or sclerotic bone lesions. IMPRESSION: 1. Interval mixed response to therapy. 2. There has been decrease in size of anterior mediastinal and right hilar lymph node metastases. 3. Progression of multifocal liver metastasis with new lesion in the lateral segment of left lobe. 4. Interval enlargement of portacaval lymph node compatible with metastatic adenopathy. Electronically Signed   By: TKerby MoorsM.D.   On: 10/29/2016 16:08   Ct Abdomen Pelvis W Contrast  Result Date: 10/29/2016 CLINICAL DATA:  Metastatic lung cancer with liver metastases. EXAM: CT CHEST, ABDOMEN, AND PELVIS WITH CONTRAST TECHNIQUE: Multidetector CT imaging of the chest, abdomen and pelvis was performed following  the standard protocol during bolus administration of intravenous contrast. CONTRAST:  1048mISOVUE-300 IOPAMIDOL (ISOVUE-300) INJECTION 61% COMPARISON:  07/29/2016 FINDINGS: CT CHEST FINDINGS Cardiovascular: Heart size appears normal. No pericardial effusion. Aortic atherosclerosis. Calcification within the LAD coronary artery noted. Mediastinum/Nodes: The trachea appears patent and is midline. Normal appearance of the esophagus. Pre-vascular node measures 1.3 cm, image 22 of series 2. Previously 2.0 cm. Right hilar node measures 2.1 x 1.9  cm, image 28 of series 2. Previously 3.6 x 3.2 cm. Lungs/Pleura: No pleural fluid. Moderate changes of centrilobular emphysema. The triangular-shaped density within the periphery of the left lower lobe measures 1 x 0.6 cm, image 83 of series 5. Previously 0.7 x 0.6 cm. Stable scarring and fibrosis involving the superior segment of left lower lobe. Musculoskeletal: There is a small subpleural nodule within the left lower lobe measuring 4 mm, image number 95 of series 5. Unchanged from previous exam. CT ABDOMEN PELVIS FINDINGS Hepatobiliary: Large confluent mass involving the right lobe of liver is again noted and appears ill defined measuring 10.9 x 17.3 by 18.2 cm. Previously this measured 9.5 x 16.1 by 9.2 cm. Index lesion in the lateral segment of left lobe measures 2.4 by 1.5 cm. New from previous exam. Stones and/or sludge noted within the dependent portion of the gallbladder. Pancreas: Unremarkable. No pancreatic ductal dilatation or surrounding inflammatory changes. Spleen: Normal in size without focal abnormality. Adrenals/Urinary Tract: Adrenal glands are unremarkable. Kidneys are normal, without renal calculi, focal lesion, or hydronephrosis. Bladder is unremarkable. Stomach/Bowel: The stomach appears normal. The small bowel loops have a normal course and caliber. The appendix is visualized and appears normal. Unremarkable appearance of the colon. Vascular/Lymphatic:  Aortic atherosclerosis. No aneurysm. Portacaval lymph node measures 1.7 cm, image 66 of series 2. On the previous exam this measured 1 cm. Reproductive: Prostate gland appears enlarged. Other: There is no ascites or focal fluid collections within the abdomen or pelvis. Musculoskeletal: No aggressive lytic or sclerotic bone lesions. IMPRESSION: 1. Interval mixed response to therapy. 2. There has been decrease in size of anterior mediastinal and right hilar lymph node metastases. 3. Progression of multifocal liver metastasis with new lesion in the lateral segment of left lobe. 4. Interval enlargement of portacaval lymph node compatible with metastatic adenopathy. Electronically Signed   By: Kerby Moors M.D.   On: 10/29/2016 16:08    Labs:  CBC:  Recent Labs  10/10/16 1036 10/31/16 0942 11/21/16 1131 11/28/16 0755  WBC 5.3 5.6 5.4 5.9  HGB 15.8 15.3 16.2 16.1  HCT 49.4 47.6 50.7* 50.4  PLT 174 174 183 217    COAGS:  Recent Labs  08/08/16 0625 11/28/16 0755  INR 1.05 1.02  APTT 36  --     BMP:  Recent Labs  12/02/15 0620 12/03/15 0500 12/04/15 0200  12/28/15 1106 02/13/16 1405  10/10/16 1033 10/31/16 0942 11/21/16 1131 11/28/16 0755  NA 140 138 139  < > 141 139  < > 138 134* 136 136  K 4.1 3.9 3.8  < > 4.9 5.1  < > 4.6 5.0 5.3* 4.6  CL 108 107 108  --  99 103  --   --   --   --  103  CO2 '26 26 27  '$ < > 37* 29  < > 22 20* 21* 23  GLUCOSE 131* 105* 131*  < > 154* 128*  < > 260* 252* 211* 155*  BUN 25* 16 14  < > 14 27*  < > 37.5* 32.5* 28.3* 36*  CALCIUM 7.5* 7.6* 7.4*  < > 8.5 9.2  < > 9.1 9.1 9.2 8.6*  CREATININE 1.08 0.81 0.82  < > 0.95 1.11  < > 1.5* 1.3 1.2 1.36*  GFRNONAA >60 >60 >60  --   --   --   --   --   --   --  49*  GFRAA >60 >60 >60  --   --   --   --   --   --   --  57*  < > = values in this interval not displayed.  LIVER FUNCTION TESTS:  Recent Labs  10/10/16 1033 10/31/16 0942 11/21/16 1131 11/28/16 0755  BILITOT 0.43 0.62 0.95 1.8*  AST 42*  46* 67* 75*  ALT 33 36 54 56  ALKPHOS 125 139 183* 184*  PROT 6.6 6.5 6.5 6.2*  ALBUMIN 3.2* 3.0* 2.9* 3.5    TUMOR MARKERS: No results for input(s): AFPTM, CEA, CA199, CHROMGRNA in the last 8760 hours.  Assessment and Plan:  Left lung primary adenocarcinoma and a primary hepatocellular carcinoma.   The hepatocellular carcinoma is quite extensive diffusely involving more than two thirds of the right hepatic lobe lesion in the left hepatic lobe.  Dr. Laurence Ferrari discussed the risks, benefits and alternatives to Y90 radioembolization.   He explained to him that given his disease of burden, there is some small risk of hepatic decompensation.   We will need to monitor his liver function very closely following treatment of the right lobe.    Dr. Laurence Ferrari also discussed with him that this therapy is palliative and not curative with the intent to prolong quality and duration of life.  Dr.  Laurence Ferrari discussed at the next option after Y90 would likely be a targeted multikinase inhibitor such as Nexavar or alternative.  Will proceed today with Pre Y-90 mapping by Dr. Laurence Ferrari  Risks and Benefits discussed with the patient including, but not limited to bleeding, infection, vascular injury, post procedural pain, nausea, vomiting and fatigue, contrast induced renal failure, liver failure, radiation injury to the bowel, radiation induced cholecystitis, neutropenia and possible need for additional procedures.  All of the patient's questions were answered, patient is agreeable to proceed. Consent signed and in chart.   Electronically Signed: Murrell Redden PA-C 11/28/2016, 8:46 AM   I spent a total of  25 Minutes in face to face in clinical consultation, greater than 50% of which was counseling/coordinating care for Shawn Espinoza and pre Y-90

## 2016-11-28 NOTE — Progress Notes (Signed)
3cc air removed from wrist cuff.  No bleeding noted

## 2016-11-28 NOTE — Sedation Documentation (Signed)
5 Fr sheath removed for left wrist by Dr. Laurence Ferrari.  Hemostasis achieved using TR Band inflated with 9cc of air.  Wrist unremarkable.

## 2016-11-28 NOTE — Progress Notes (Signed)
3cc removed from wrist cuff as ordered.

## 2016-11-28 NOTE — Sedation Documentation (Signed)
Pt transported back to short stay on bed with RN for recovery.

## 2016-11-29 ENCOUNTER — Other Ambulatory Visit: Payer: Self-pay

## 2016-12-02 ENCOUNTER — Other Ambulatory Visit: Payer: Self-pay | Admitting: Medical Oncology

## 2016-12-02 ENCOUNTER — Other Ambulatory Visit: Payer: Self-pay | Admitting: Family Medicine

## 2016-12-03 ENCOUNTER — Telehealth: Payer: Self-pay | Admitting: *Deleted

## 2016-12-03 ENCOUNTER — Encounter: Payer: Self-pay | Admitting: Internal Medicine

## 2016-12-03 ENCOUNTER — Ambulatory Visit (HOSPITAL_BASED_OUTPATIENT_CLINIC_OR_DEPARTMENT_OTHER): Payer: Medicare Other | Admitting: Internal Medicine

## 2016-12-03 ENCOUNTER — Telehealth: Payer: Self-pay | Admitting: Medical Oncology

## 2016-12-03 ENCOUNTER — Ambulatory Visit (HOSPITAL_BASED_OUTPATIENT_CLINIC_OR_DEPARTMENT_OTHER): Payer: Medicare Other

## 2016-12-03 ENCOUNTER — Other Ambulatory Visit: Payer: Self-pay | Admitting: Medical Oncology

## 2016-12-03 VITALS — BP 144/82 | HR 83 | Resp 18 | Ht 69.0 in | Wt 173.1 lb

## 2016-12-03 DIAGNOSIS — R1011 Right upper quadrant pain: Secondary | ICD-10-CM | POA: Diagnosis not present

## 2016-12-03 DIAGNOSIS — C220 Liver cell carcinoma: Secondary | ICD-10-CM

## 2016-12-03 DIAGNOSIS — G893 Neoplasm related pain (acute) (chronic): Secondary | ICD-10-CM

## 2016-12-03 DIAGNOSIS — C3492 Malignant neoplasm of unspecified part of left bronchus or lung: Secondary | ICD-10-CM

## 2016-12-03 DIAGNOSIS — K219 Gastro-esophageal reflux disease without esophagitis: Secondary | ICD-10-CM

## 2016-12-03 DIAGNOSIS — C3490 Malignant neoplasm of unspecified part of unspecified bronchus or lung: Secondary | ICD-10-CM | POA: Diagnosis not present

## 2016-12-03 DIAGNOSIS — R0602 Shortness of breath: Secondary | ICD-10-CM

## 2016-12-03 DIAGNOSIS — R0781 Pleurodynia: Secondary | ICD-10-CM | POA: Diagnosis not present

## 2016-12-03 HISTORY — DX: Neoplasm related pain (acute) (chronic): G89.3

## 2016-12-03 LAB — CBC WITH DIFFERENTIAL/PLATELET
BASO%: 0.7 % (ref 0.0–2.0)
BASOS ABS: 0.1 10*3/uL (ref 0.0–0.1)
EOS ABS: 0 10*3/uL (ref 0.0–0.5)
EOS%: 0.5 % (ref 0.0–7.0)
HCT: 53.3 % — ABNORMAL HIGH (ref 38.4–49.9)
HEMOGLOBIN: 16.8 g/dL (ref 13.0–17.1)
LYMPH%: 7.5 % — ABNORMAL LOW (ref 14.0–49.0)
MCH: 27 pg — AB (ref 27.2–33.4)
MCHC: 31.5 g/dL — ABNORMAL LOW (ref 32.0–36.0)
MCV: 85.8 fL (ref 79.3–98.0)
MONO#: 0.8 10*3/uL (ref 0.1–0.9)
MONO%: 9.6 % (ref 0.0–14.0)
NEUT#: 6.6 10*3/uL — ABNORMAL HIGH (ref 1.5–6.5)
NEUT%: 81.7 % — ABNORMAL HIGH (ref 39.0–75.0)
PLATELETS: 359 10*3/uL (ref 140–400)
RBC: 6.22 10*6/uL — ABNORMAL HIGH (ref 4.20–5.82)
RDW: 18 % — AB (ref 11.0–14.6)
WBC: 8.1 10*3/uL (ref 4.0–10.3)
lymph#: 0.6 10*3/uL — ABNORMAL LOW (ref 0.9–3.3)

## 2016-12-03 LAB — COMPREHENSIVE METABOLIC PANEL
ALBUMIN: 2.8 g/dL — AB (ref 3.5–5.0)
ALK PHOS: 216 U/L — AB (ref 40–150)
ALT: 73 U/L — ABNORMAL HIGH (ref 0–55)
ANION GAP: 12 meq/L — AB (ref 3–11)
AST: 77 U/L — ABNORMAL HIGH (ref 5–34)
BILIRUBIN TOTAL: 1.97 mg/dL — AB (ref 0.20–1.20)
BUN: 38.7 mg/dL — ABNORMAL HIGH (ref 7.0–26.0)
CALCIUM: 9.5 mg/dL (ref 8.4–10.4)
CO2: 24 mEq/L (ref 22–29)
Chloride: 98 mEq/L (ref 98–109)
Creatinine: 1.6 mg/dL — ABNORMAL HIGH (ref 0.7–1.3)
EGFR: 42 mL/min/{1.73_m2} — AB (ref >90)
GLUCOSE: 178 mg/dL — AB (ref 70–140)
POTASSIUM: 5.6 meq/L — AB (ref 3.5–5.1)
SODIUM: 134 meq/L — AB (ref 136–145)
Total Protein: 7.6 g/dL (ref 6.4–8.3)

## 2016-12-03 MED ORDER — OXYCODONE HCL 5 MG PO TABS: 5.0000 mg | ORAL_TABLET | ORAL | 0 refills | Status: DC | PRN

## 2016-12-03 NOTE — Telephone Encounter (Signed)
for past 3 days pt reports muscle spasms on right side between hip and shoulder . Pain got worse last night. Per Mohamed appt today at 1330

## 2016-12-03 NOTE — Telephone Encounter (Signed)
Received call from wife Shawn Espinoza requesting a call back from nurse.   Spoke with Shawn Espinoza, and was informed that pt has pain on Right side - more like muscle spasm - from hip to axillary  area.  Stated pt has increased pain when taking deep breath.  Pt has had this pain before but more intense past couple of days.  Takes Tramadol with some relief.   Pt had mapping for Y90 procedure on 11/28/16.   Shawn Espinoza also stated pt has had more difficulty with swallowing, has more phlegm.  Pt takes Carafate with some relief.   [name] wanted to know if pt should need more pain medication since pain is more constant past few days.   Pt had taken Robaxin for muscle spasm but not much relief. Shawn Espinoza also wanted to inform Dr. [name] that pt will have repeated bilirubin lab on 12/14 due to level was high.  Radiologist will determine about procedure based on lab results. [name]'s   Phone     [phone].

## 2016-12-03 NOTE — Progress Notes (Signed)
[facility] [phone]   Fax:([phone]) [place], [place] [address]  DIAGNOSIS:   PRIOR THERAPY: 1) Systemic chemotherapy with carboplatin for AUC of 5 and Alimta 500 mg/M2 every 3 weeks. First dose expected on 04/13/2014. Status post 6 cycles. 2)  Maintenance chemotherapy with single agent Alimta 500 mg/M2 every 3 weeks. First cycle on 09/06/2014. Status post 3 cycles. 3) Second course of systemic chemotherapy with carboplatin for AUC of 5 and Alimta 500 MG/M2 every 3 weeks. First cycle 11/08/2014. Status post 6 cycles. 4)  Immunotherapy with Nivolumab 3 MG/KG every 2 weeks. First dose 05/24/2015. Status post 3 cycles. 5) Systemic chemotherapy with docetaxel 60 MG/M2 and Cyramza 10 MG/KG every 3 weeks status post 6 cycles. Last dose was given 11/21/2015 with stable disease. 6) Systemic chemotherapy with docetaxel 75 MG/M2 every 3 weeks. First cycle 05/08/2016. Status post one cycle. Discontinued secondary to intolerance to the Neulasta injection. 7) Systemic chemotherapy with docetaxel 25 MG/M2 every weekly. First cycle 05/29/2016. Status post 9 cycles, last dose was given 07/24/2016. Discontinued secondary to disease progression.  CURRENT THERAPY: Ketruda (pembrolizumab) 200 MG IV every 3 weeks. First dose 08/29/2016. Status post 5 cycles.  INTERVAL HISTORY: Shawn Espinoza 75 y.o. male returns to the clinic today for walk in visit accompanied by his wife. The patient is complaining of pain and muscle spasm in the right lower rib cage and right upper quadrant of the abdomen. It started last week after the mapping procedure for the radioembolization. He took tramadol and muscle relaxant with no improvement. He denied having any current nausea or vomiting. He continues to have the baseline shortness breath but no worsening from before. He denied having any fever or chills. He is expected to  have the radioembolization for the liver lesions on 12/12/2016. He is here today for evaluation and recommendation regarding his pain.  MEDICAL HISTORY: Past Medical History:  Diagnosis Date  . Acute infarction of intestine, part and extent unspecified ([facility]) 04/25/2016  . 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 Arthritis   . Atrial fibrillation ([facility])   . Constipation   . Depression   . 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 Diverticulosis of colon   . Dysrhythmia    Atrial fib (found 07/2014)  . Fatty liver 02/29/00   abd ultrasound  . Full code status 09/26/2015  . GERD (gastroesophageal reflux disease)    occasional  . HCAP (healthcare-associated pneumonia) 11/27/2015   IV vanc/zosyn then levaquin courses x2   . Hepatocellular carcinoma ([facility]) 08/21/2016  . History of diabetes mellitus, type II    resolved with diet, h/o neuropathy  . History of tobacco use quit 1990s  . Hyperlipidemia   . Hypertension   . Hypertensive retinopathy of both eyes 2015   mild  . Lobar pneumonia ([facility]) 10/18/2015  . Lower back pain   . 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 Neuropathy ([facility])   . 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 Personal history of colonic adenomas 07/06/2013  . Pneumonia   . 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 Shortness of breath   . 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 Systolic murmur 5409   2Decho - normal  LV fxn, EF 55%, mild AS, biatrial enlargement    ALLERGIES:  is allergic to statins; diltiazem hcl; doxazosin mesylate; hydrocodone; nifedipine; red yeast rice; and sertraline.  MEDICATIONS:  Current Outpatient Prescriptions  Medication Sig Dispense Refill  . acetaminophen (TYLENOL) 500 MG tablet Take 500 mg by mouth 2 (two) times daily. Reported on 05/06/2016    . albuterol (PROVENTIL HFA;VENTOLIN HFA)  108 (90 BASE) MCG/ACT inhaler Inhale 2 puffs into the lungs every 6 (six) hours as needed for wheezing or shortness of breath. 1 Inhaler 3  . benzonatate (TESSALON) 100 MG capsule Take 1-2 capsules (100-200 mg total) by mouth 3 (three) times daily as needed for cough. 40 capsule 1  . ELIQUIS 5 MG TABS tablet TAKE ONE TABLET BY MOUTH TWICE DAILY 180 tablet 1  . glucose blood (ONE TOUCH ULTRA TEST) test strip Use to check sugar once daily and as needed. Dx: E09.9 100 each 3  . guaiFENesin (MUCINEX) 600 MG 12 hr tablet Take 600 mg by mouth 2 (two) times daily as needed for cough or to loosen phlegm.     . hydrocortisone (ANUSOL-HC) 25 MG suppository Place 1 suppository (25 mg total) rectally 2 (two) times daily. (Patient taking differently: Place 25 mg rectally 2 (two) times daily as needed for hemorrhoids or itching. ) 12 suppository 0  . KLOR-CON M10 10 MEQ tablet Take 2 tablets (20 mEq total) by mouth every other day. 30 tablet 6  . lidocaine-prilocaine (EMLA) cream Apply 1 application topically as needed. (Patient taking differently: Apply 1 application topically as needed (for port access). ) 30 g 1  . methocarbamol (ROBAXIN) 500 MG tablet Take 500 mg by mouth 3 (three) times daily as needed. Muscle pain    . metoprolol tartrate (LOPRESSOR) 50 MG tablet Take 0.5 tablets (25 mg total) by mouth 2 (two) times daily. 30 tablet 6  . Multiple Vitamins-Minerals (ICAPS) CAPS Take 1 capsule by mouth 2 (two) times daily.     [name] DELICA LANCETS 02V MISC Use to check sugar once daily and as needed. Dx:E09.9 100 each 3  . polyethylene glycol (MIRALAX / GLYCOLAX) packet Take 8.5 g by mouth daily.     Marland Kitchen spironolactone (ALDACTONE) 50 MG tablet Take 1 tablet (50 mg total) by mouth daily.    Marland Kitchen torsemide (DEMADEX) 20 MG tablet Take 1 tablet (20 mg total) by mouth every other day. 30 tablet 6  . traMADol (ULTRAM) 50 MG tablet Take 1 tablet (50 mg total) by mouth every 6 (six) hours as needed. (Patient taking  differently: Take 50 mg by mouth every 6 (six) hours as needed for moderate pain. ) 30 tablet 3  . glipiZIDE (GLUCOTROL) 5 MG tablet Take 1 tablet (5 mg total) by mouth daily before breakfast. (Patient not taking: Reported on 12/03/2016) 30 tablet 6  . sucralfate (CARAFATE) 1 g tablet Take 1 tablet (1 g total) by mouth 3 (three) times daily before meals. (Patient not taking: Reported on 12/03/2016) 90 tablet 1   No current facility-administered medications for this visit.     SURGICAL HISTORY:  Past Surgical History:  Procedure Laterality Date  . CATARACT EXTRACTION Right 03/2015   Dr Shawn Espinoza  . CHEST TUBE INSERTION Left 08/26/2014   Procedure: INSERTION OF LEFT  PLEURAL DRAINAGE CATHETER;  Surgeon: Ivin Poot, MD;  Location: [facility];  Service: Thoracic;  Laterality: Left;  . COLONOSCOPY  2004  . COLONOSCOPY  2014   tubular adenoma x1, mod diverticulosis ([facility])  .  FLEXIBLE BRONCHOSCOPY  02/2014   WNL  . PORTACATH PLACEMENT Right 03/31/2015   Procedure: INSERTION PORT-A-CATH;  Surgeon: Ivin Poot, MD;  Location: [facility];  Service: Thoracic;  Laterality: Right;  . REMOVAL OF PLEURAL DRAINAGE CATHETER Left 03/31/2015   Procedure: REMOVAL OF PLEURAL DRAINAGE CATHETER;  Surgeon: Ivin Poot, MD;  Location: [facility];  Service: Thoracic;  Laterality: Left;  Marland Kitchen VIDEO BRONCHOSCOPY Bilateral 03/17/2014   Procedure: VIDEO BRONCHOSCOPY WITH FLUORO;  Surgeon: Tanda Rockers, MD;  Location: [facility];  Service: Cardiopulmonary;  Laterality: Bilateral;    REVIEW OF SYSTEMS:  Constitutional: positive for fatigue Eyes: negative Ears, nose, mouth, throat, and face: negative Respiratory: positive for dyspnea on exertion Cardiovascular: negative Gastrointestinal: positive for abdominal pain, constipation and reflux symptoms Genitourinary:negative for dysuria, frequency and hematuria Integument/breast: negative for pruritus and rash Hematologic/lymphatic:  negative Musculoskeletal:negative Neurological: negative Behavioral/Psych: negative Endocrine: negative Allergic/Immunologic: negative   PHYSICAL EXAMINATION: General appearance: alert, cooperative, fatigued and no distress Head: Normocephalic, without obvious abnormality, atraumatic Neck: no adenopathy, no JVD, supple, symmetrical, trachea midline and thyroid not enlarged, symmetric, no tenderness/mass/nodules Lymph nodes: Cervical, supraclavicular, and axillary nodes normal. Resp: clear to auscultation bilaterally Back: symmetric, no curvature. ROM normal. No CVA tenderness., Tender to palpation at the right lower thoracic cage. Cardio: regular rate and rhythm, S1, S2 normal, no murmur, click, rub or gallop GI: soft, non-tender; bowel sounds normal; no masses,  no organomegaly Extremities: extremities normal, atraumatic, no cyanosis or edema Neurologic: Alert and oriented X 3, normal strength and tone. Normal symmetric reflexes. Normal coordination and gait  ECOG PERFORMANCE STATUS: 1 - Symptomatic but completely ambulatory  Blood pressure (!) 144/82, pulse 83, resp. rate 18, height '5\' 9"'$  (1.753 m), weight 173 lb 1.6 oz (78.5 kg), SpO2 97 %.  LABORATORY DATA: Lab Results  Component Value Date   WBC 8.1 12/03/2016   HGB 16.8 12/03/2016   HCT 53.3 (H) 12/03/2016   MCV 85.8 12/03/2016   PLT 359 12/03/2016      Chemistry      Component Value Date/Time   NA 136 11/28/2016 0755   NA 136 11/21/2016 1131   K 4.6 11/28/2016 0755   K 5.3 (H) 11/21/2016 1131   CL 103 11/28/2016 0755   CO2 23 11/28/2016 0755   CO2 21 (L) 11/21/2016 1131   BUN 36 (H) 11/28/2016 0755   BUN 28.3 (H) 11/21/2016 1131   CREATININE 1.36 (H) 11/28/2016 0755   CREATININE 1.2 11/21/2016 1131      Component Value Date/Time   CALCIUM 8.6 (L) 11/28/2016 0755   CALCIUM 9.2 11/21/2016 1131   ALKPHOS 184 (H) 11/28/2016 0755   ALKPHOS 183 (H) 11/21/2016 1131   AST 75 (H) 11/28/2016 0755   AST 67 (H)  11/21/2016 1131   ALT 56 11/28/2016 0755   ALT 54 11/21/2016 1131   BILITOT 1.8 (H) 11/28/2016 0755   BILITOT 0.95 11/21/2016 1131       RADIOGRAPHIC STUDIES: Dg Chest 2 View  Result Date: 11/27/2016 CLINICAL DATA:  History of lung carcinoma. History of pleural effusion. EXAM: CHEST  2 VIEW COMPARISON:  CT, 10/29/2016 chest radiographs, 12/28/2015 FINDINGS: The cardiac silhouette is normal in size and configuration. No mediastinal or hilar masses. No convincing adenopathy. Lungs are mildly hyperexpanded but clear. The opacities noted in the left lung base on the prior chest radiograph have essentially resolved. No pleural effusion.  No pneumothorax. Right anterior chest wall Port-A-Cath is well positioned and stable. Skeletal structures are  demineralized but intact. IMPRESSION: 1. No acute cardiopulmonary disease. 2. No pleural effusion. Electronically Signed   By: Lajean Manes M.D.   On: 11/27/2016 10:49   Nm Liver Img Spect  Result Date: 11/29/2016 CLINICAL DATA:  Hepatocellular carcinoma. EXAM: NUCLEAR MEDICINE LIVER SCAN TECHNIQUE: Abdominal images were obtained in multiple projections after intrahepatic arterial injection of radiopharmaceutical. SPECT imaging was performed. Lung shunt calculation was performed. RADIOPHARMACEUTICALS:  5.12mllicurie MAA TECHNETIUM TO 94M ALBUMIN AGGREGATED COMPARISON:  CT from 10/29/2016. FINDINGS: The injected microaggregated albumin localizes within the liver. No evidence of activity within the stomach, duodenum, or bowel. Calculated shunt fraction to the lungs equals 11.2%. IMPRESSION: 1. No significant extrahepatic radiotracer activity following intrahepatic arterial injection of MAA. 2. Lung shunt fraction equals 11.2% Electronically Signed   By: TKerby MoorsM.D.   On: 11/29/2016 16:10   Nm Fusion  Result Date: 11/29/2016 CLINICAL DATA:  Hepatocellular carcinoma. EXAM: NUCLEAR MEDICINE LIVER SCAN TECHNIQUE: Abdominal images were obtained in multiple  projections after intrahepatic arterial injection of radiopharmaceutical. SPECT imaging was performed. Lung shunt calculation was performed. RADIOPHARMACEUTICALS:  5.426mlicurie MAA TECHNETIUM TO 94M ALBUMIN AGGREGATED COMPARISON:  CT from 10/29/2016. FINDINGS: The injected microaggregated albumin localizes within the liver. No evidence of activity within the stomach, duodenum, or bowel. Calculated shunt fraction to the lungs equals 11.2%. IMPRESSION: 1. No significant extrahepatic radiotracer activity following intrahepatic arterial injection of MAA. 2. Lung shunt fraction equals 11.2% Electronically Signed   By: TaKerby Moors.D.   On: 11/29/2016 16:10    ASSESSMENT AND PLAN: This is a very pleasant 89ears old white male with: 1) stage IV non-small cell lung cancer, adenocarcinoma diagnosed in March 2015 status post several chemotherapy regimens and he is currently on Ketruda status post 5 cycles. 2) unresectable hepatocellular carcinoma diagnosed in August 2017. He also currently on treatment with immunotherapy with KeHungaryHe is expected to have radioembolization on 12/12/2016. He recently underwent the mapping for the procedure on 11/28/2016. 3) right lower rib cage and right upper quadrant pain: Likely secondary to progression of his tumor in the liver versus complication from the mapping of the radioembolization. I will start the patient on OxyIR 5 mg by mouth every 4 hours as needed for pain. We will continue to monitor this closely and if needed we will repeat CT scan of the chest, abdomen and pelvis to rule out any disease progression or other invasive lesions. 4) acid reflux: He was advised to start taking over-the-counter Prilosec or Nexium. I will see him back for follow-up visit in 2 weeks for evaluation before starting the next cycle of his treatment with immunotherapy. He was advised to call immediately if he has any concerning symptoms in the interval. The patient voices  understanding of current disease status and treatment options and is in agreement with the current care plan.  All questions were answered. The patient knows to call the clinic with any problems, questions or concerns. We can certainly see the patient much sooner if necessary.  I spent 15 minutes counseling the patient face to face. The total time spent in the appointment was 25 minutes.  Disclaimer: This note was dictated with voice recognition software. Similar sounding words can inadvertently be transcribed and may not be corrected upon review.

## 2016-12-03 NOTE — Telephone Encounter (Signed)
appt with [facility].

## 2016-12-05 ENCOUNTER — Other Ambulatory Visit: Payer: Medicare Other

## 2016-12-09 ENCOUNTER — Encounter (HOSPITAL_COMMUNITY): Payer: Self-pay | Admitting: Interventional Radiology

## 2016-12-09 ENCOUNTER — Telehealth: Payer: Self-pay | Admitting: Medical Oncology

## 2016-12-09 DIAGNOSIS — C3490 Malignant neoplasm of unspecified part of unspecified bronchus or lung: Secondary | ICD-10-CM

## 2016-12-09 MED ORDER — TRAMADOL HCL 50 MG PO TABS: 50.0000 mg | ORAL_TABLET | Freq: Four times a day (QID) | ORAL | 3 refills | Status: DC | PRN

## 2016-12-09 NOTE — Telephone Encounter (Signed)
Oxycodone is too strong for pt and making him constipated. Can he get tramadol instead for his pain-it worked in past. Per Mohamed I called in tramadol and pt notified to stop oxycodone.

## 2016-12-10 ENCOUNTER — Telehealth: Payer: Self-pay | Admitting: Medical Oncology

## 2016-12-10 NOTE — Telephone Encounter (Signed)
ok 

## 2016-12-10 NOTE — Telephone Encounter (Signed)
Pt staying in bed all day. Taking tramadol and it helps make pain tolerable , but not take away pain completely. He is still somewhat "out of it " from oxycodone which he did not tolerate. Liver procedure was cancelled due to labs.  Note to [facility].

## 2016-12-11 ENCOUNTER — Encounter: Payer: Self-pay | Admitting: Interventional Radiology

## 2016-12-11 ENCOUNTER — Ambulatory Visit (HOSPITAL_BASED_OUTPATIENT_CLINIC_OR_DEPARTMENT_OTHER): Payer: Medicare Other | Admitting: Nurse Practitioner

## 2016-12-11 ENCOUNTER — Other Ambulatory Visit (HOSPITAL_BASED_OUTPATIENT_CLINIC_OR_DEPARTMENT_OTHER): Payer: Medicare Other

## 2016-12-11 VITALS — BP 131/85 | HR 72 | Temp 97.4°F | Resp 17 | Ht 69.0 in | Wt 168.3 lb

## 2016-12-11 DIAGNOSIS — N289 Disorder of kidney and ureter, unspecified: Secondary | ICD-10-CM

## 2016-12-11 DIAGNOSIS — Z79899 Other long term (current) drug therapy: Secondary | ICD-10-CM | POA: Diagnosis not present

## 2016-12-11 DIAGNOSIS — C3490 Malignant neoplasm of unspecified part of unspecified bronchus or lung: Secondary | ICD-10-CM

## 2016-12-11 DIAGNOSIS — C220 Liver cell carcinoma: Secondary | ICD-10-CM

## 2016-12-11 DIAGNOSIS — E875 Hyperkalemia: Secondary | ICD-10-CM

## 2016-12-11 DIAGNOSIS — R1011 Right upper quadrant pain: Secondary | ICD-10-CM | POA: Diagnosis not present

## 2016-12-11 DIAGNOSIS — C3492 Malignant neoplasm of unspecified part of left bronchus or lung: Secondary | ICD-10-CM

## 2016-12-11 DIAGNOSIS — E860 Dehydration: Secondary | ICD-10-CM

## 2016-12-11 DIAGNOSIS — R530 Neoplastic (malignant) related fatigue: Secondary | ICD-10-CM

## 2016-12-11 LAB — CBC WITH DIFFERENTIAL/PLATELET
BASO%: 0.4 % (ref 0.0–2.0)
Basophils Absolute: 0 10e3/uL (ref 0.0–0.1)
EOS%: 1.8 % (ref 0.0–7.0)
Eosinophils Absolute: 0.1 10e3/uL (ref 0.0–0.5)
HCT: 53 % — ABNORMAL HIGH (ref 38.4–49.9)
HGB: 16.7 g/dL (ref 13.0–17.1)
LYMPH%: 7.6 % — ABNORMAL LOW (ref 14.0–49.0)
MCH: 26.9 pg — ABNORMAL LOW (ref 27.2–33.4)
MCHC: 31.5 g/dL — ABNORMAL LOW (ref 32.0–36.0)
MCV: 85.5 fL (ref 79.3–98.0)
MONO#: 0.6 10e3/uL (ref 0.1–0.9)
MONO%: 8.3 % (ref 0.0–14.0)
NEUT#: 5.8 10e3/uL (ref 1.5–6.5)
NEUT%: 81.9 % — ABNORMAL HIGH (ref 39.0–75.0)
Platelets: 276 10e3/uL (ref 140–400)
RBC: 6.2 10e6/uL — ABNORMAL HIGH (ref 4.20–5.82)
RDW: 17.2 % — ABNORMAL HIGH (ref 11.0–14.6)
WBC: 7.1 10e3/uL (ref 4.0–10.3)
lymph#: 0.5 10e3/uL — ABNORMAL LOW (ref 0.9–3.3)

## 2016-12-11 LAB — COMPREHENSIVE METABOLIC PANEL WITH GFR
ALT: 56 U/L — ABNORMAL HIGH (ref 0–55)
AST: 85 U/L — ABNORMAL HIGH (ref 5–34)
Albumin: 2.8 g/dL — ABNORMAL LOW (ref 3.5–5.0)
Alkaline Phosphatase: 264 U/L — ABNORMAL HIGH (ref 40–150)
Anion Gap: 12 meq/L — ABNORMAL HIGH (ref 3–11)
BUN: 44.5 mg/dL — ABNORMAL HIGH (ref 7.0–26.0)
CO2: 24 meq/L (ref 22–29)
Calcium: 9.2 mg/dL (ref 8.4–10.4)
Chloride: 98 meq/L (ref 98–109)
Creatinine: 2 mg/dL — ABNORMAL HIGH (ref 0.7–1.3)
EGFR: 32 ml/min/1.73 m2 — ABNORMAL LOW (ref >90)
Glucose: 218 mg/dL — ABNORMAL HIGH (ref 70–140)
Potassium: 5.4 meq/L — ABNORMAL HIGH (ref 3.5–5.1)
Sodium: 133 meq/L — ABNORMAL LOW (ref 136–145)
Total Bilirubin: 1.19 mg/dL (ref 0.20–1.20)
Total Protein: 7.2 g/dL (ref 6.4–8.3)

## 2016-12-11 LAB — TSH: TSH: 10.186 m[IU]/L — ABNORMAL HIGH (ref 0.320–4.118)

## 2016-12-11 MED ORDER — TRAMADOL HCL 50 MG PO TABS: 50.0000 mg | ORAL_TABLET | Freq: Four times a day (QID) | ORAL | 3 refills | Status: AC | PRN

## 2016-12-12 ENCOUNTER — Ambulatory Visit: Payer: Medicare Other | Admitting: Internal Medicine

## 2016-12-12 ENCOUNTER — Ambulatory Visit (HOSPITAL_COMMUNITY): Payer: Medicare Other

## 2016-12-12 ENCOUNTER — Other Ambulatory Visit: Payer: Medicare Other

## 2016-12-12 ENCOUNTER — Other Ambulatory Visit: Payer: Self-pay | Admitting: Internal Medicine

## 2016-12-12 ENCOUNTER — Ambulatory Visit: Payer: Medicare Other

## 2016-12-12 ENCOUNTER — Encounter (HOSPITAL_COMMUNITY): Payer: Medicare Other

## 2016-12-12 ENCOUNTER — Encounter: Payer: Self-pay | Admitting: Nurse Practitioner

## 2016-12-12 ENCOUNTER — Other Ambulatory Visit (HOSPITAL_COMMUNITY): Payer: Medicare Other

## 2016-12-12 DIAGNOSIS — N289 Disorder of kidney and ureter, unspecified: Secondary | ICD-10-CM | POA: Insufficient documentation

## 2016-12-12 DIAGNOSIS — E875 Hyperkalemia: Secondary | ICD-10-CM | POA: Insufficient documentation

## 2016-12-12 MED ORDER — LEVOTHYROXINE SODIUM 50 MCG PO TABS: 50.0000 ug | ORAL_TABLET | Freq: Every day | ORAL | 2 refills | Status: AC

## 2016-12-12 NOTE — Assessment & Plan Note (Addendum)
Patient received cycle 5 of his Keytruda infusion on 11/21/2016.  Patient was scheduled for a liver radio embolization recently; but it was canceled secondary to an elevated bilirubin.  Patient complains of increased pain to the right upper quadrant region.  Labs obtained today revealed that the bilirubin has improved from 1.97 down to 1.19.  AST has increased from 77 up to 85.  However, ALT has improved from 73 down to 56.  Reviewed all findings with Dr. [name]; and then this provider called Dr. Laurence Ferrari interventional radiologist with [facility] imaging.  He reviewed all of patient's lab results; and also noted that patient has become increasingly weak and more fatigued.  Patient states that he is x-ray spent most of this last week in the bed and has had minimal to no appetite.  Given the lab results and patient's continued declining health-Dr. Laurence Ferrari recommended that any future Y-90 liver radio embolization be postponed; secondary to the fact that it could create even worsening affects and possible liver failure in the future.  Dr. Laurence Ferrari also stated, the a phone call that he had called and talked to the patient's wife at length; and discussed all of this in detail.  Dr. Laurence Ferrari stated that patient's wife was in full agreement with the cancellation of the embolization procedure.  The plan is for the patient to return on 12/18/16 to review all with Dr. [name] in regards to further treatment plan.

## 2016-12-12 NOTE — Assessment & Plan Note (Signed)
Patient's creatinine has increased to 2.0 today.  Most likely, this is secondary to patient's mild dehydration.  Will continue to monitor closely.

## 2016-12-12 NOTE — Assessment & Plan Note (Addendum)
Patient received cycle 5 of his Keytruda infusion on 11/21/2016.  Patient was scheduled for a liver radio embolization recently; but it was canceled secondary to an elevated bilirubin.  Patient complains of increased pain to the right upper quadrant region.  Labs obtained today revealed that the bilirubin has improved from 1.97 down to 1.19.  AST has increased from 77 up to 85.  However, ALT has improved from 73 down to 56.  Reviewed all findings with Dr. [name]; and then this provider called Dr. Laurence Ferrari interventional radiologist with [facility] imaging.  He reviewed all of patient's lab results; and also noted that patient has become increasingly weak and more fatigued.  Patient states that he is x-ray spent most of this last week in the bed and has had minimal to no appetite.  Given the lab results and patient's continued declining health-Dr. Laurence Ferrari recommended that any future Y-90 liver radio embolization be postponed; secondary to the fact that it could create even worsening affects and possible liver failure in the future.  Dr. Laurence Ferrari also stated, the a phone call that he had called and talked to the patient's wife at length; and discussed all of this in detail.  Dr. Laurence Ferrari stated that patient's wife was in full agreement with the cancellation of the embolization procedure.  The plan is for the patient to return on 12/18/16 to review all with Dr. [name] in regards to further treatment plan.

## 2016-12-12 NOTE — Assessment & Plan Note (Signed)
Patient has had decreased oral intake and appears dehydrated today.  Sodium was down to 133.  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  Advised patient that he could return to the [facility] any other time this week to receive additional IV fluid rehydration.  If he felt needed.

## 2016-12-12 NOTE — Progress Notes (Signed)
SYMPTOM MANAGEMENT CLINIC    Chief Complaint: Right upper quadrant pain, dehydration  HPI:  Shawn Espinoza 75 y.o. male diagnosed with both lung cancer and hepatocellular cancer.  Currently undergoing keytruda   therapy.    Oncology History   Patient presented with SOB.    Adenocarcinoma of lung Lung cancer (Resolved on 03/26/2014)   Primary site: Lung (Left)   Staging method: AJCC 7th Edition   Clinical: Stage IV (T3, N3, M1b) signed by Curt Bears, MD on 03/24/2014  3:06 PM   Summary: Stage IV (T3, N3, M1b)       Adenocarcinoma of left lung, stage 4 ([place])   02/22/2014 Imaging    CXR impression mild fullness in the left hilar region with diffuse interstitial prominence throughout the left lung, predominantly in the left lower lobe.  This may simply reflect a left lower lobe bronchopneumonia with some reactive left hilar lymphadeno      02/23/2014 Imaging    CT Chest with contrast impression abnormal findings in the left lung as described above including patchy groud-glass attenuation, thickened interstitium, left upper and left lower lobe pulmonary nodules and small to moderate left pleural effusion.        03/17/2014 Pathology Results    Lung, transbronchial biopsy, LUL postivie for adenocarcinoma       03/17/2014 Surgery    Video flexible fiberoptic bronchoscopy       03/21/2014 Initial Diagnosis    Adenocarcinoma of lung      03/30/2014 Imaging    PET scan impression findings compatible with primary bronchogenic carcinoma involving the left lung.  There is evidence of lymphangitic spread throughout the left lung.  Hypermetabolic bilateral hilar and bilateral medicastinal lymph nodes.        04/01/2014 Imaging    MRI Brain with and without contrast impression no visible intracranial metastatic disease.  Atrophy, small vessel disease, and remote lacunar infarcts.  No acute intracranial findings.       04/08/2014 Procedure    Ultrasound guided left thoracentesis  impression successful ultrasound guided left thoracentesis yielding 1.7 L of plueral fluid.        Review of Systems  Constitutional: Positive for malaise/fatigue and weight loss.  Gastrointestinal: Positive for abdominal pain and nausea.  Neurological: Positive for weakness.  All other systems reviewed and are negative.   Past Medical History:  Diagnosis Date  . Acute infarction of intestine, part and extent unspecified ([place]) 04/25/2016  . 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 Arthritis   . Atrial fibrillation ([place])   . Cancer associated pain 12/03/2016  . Constipation   . Depression   . Diabetes mellitus type 2 with retinopathy ([place])   . Diverticulosis of colon   . Dysrhythmia    Atrial fib (found 07/2014)  . Fatty liver 02/29/00   abd ultrasound  . Full code status 09/26/2015  . GERD (gastroesophageal reflux disease)    occasional  . HCAP (healthcare-associated pneumonia) 11/27/2015   IV vanc/zosyn then levaquin courses x2   . Hepatocellular carcinoma ([place]) 08/21/2016  . History of diabetes mellitus, type II    resolved with diet, h/o neuropathy  . History of tobacco use quit 1990s  . Hyperlipidemia   . Hypertension   . Hypertensive retinopathy of both eyes 2015   mild  . Lobar pneumonia ([place]) 10/18/2015  . Lower back pain   . 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  Neuropathy ([place])   . Non-small cell carcinoma of left lung ([place]) 02/2014   stage IIIb/IV on chemo  . Personal history of colonic adenomas 07/06/2013  . Pneumonia   . 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 Shortness of breath   . 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 Systolic murmur 1610   2Decho - normal LV fxn, EF 55%, mild AS, biatrial enlargement    Past Surgical History:  Procedure Laterality Date  . CATARACT EXTRACTION Right 03/2015   Dr [name]  . CHEST TUBE INSERTION Left 08/26/2014    Procedure: INSERTION OF LEFT  PLEURAL DRAINAGE CATHETER;  Surgeon: Ivin Poot, MD;  Location: [facility];  Service: Thoracic;  Laterality: Left;  . COLONOSCOPY  2004  . COLONOSCOPY  2014   tubular adenoma x1, mod diverticulosis ([place])  . FLEXIBLE BRONCHOSCOPY  02/2014   WNL  . IR GENERIC HISTORICAL  11/28/2016   IR US GUIDE VASC ACCESS RIGHT 11/28/2016 Jacqulynn Cadet, MD [facility]  . IR GENERIC HISTORICAL  11/28/2016   IR ANGIOGRAM SELECTIVE EACH ADDITIONAL VESSEL 11/28/2016 Jacqulynn Cadet, MD [facility]  . IR GENERIC HISTORICAL  11/28/2016   IR ANGIOGRAM VISCERAL SELECTIVE 11/28/2016 Jacqulynn Cadet, MD [facility]  . IR GENERIC HISTORICAL  11/28/2016   IR ANGIOGRAM SELECTIVE EACH ADDITIONAL VESSEL 11/28/2016 Jacqulynn Cadet, MD [facility]  . IR GENERIC HISTORICAL  11/28/2016   IR ANGIOGRAM SELECTIVE EACH ADDITIONAL VESSEL 11/28/2016 Jacqulynn Cadet, MD [facility]  . IR GENERIC HISTORICAL  11/28/2016   IR ANGIOGRAM SELECTIVE EACH ADDITIONAL VESSEL 11/28/2016 Jacqulynn Cadet, MD [facility]  . IR GENERIC HISTORICAL  11/28/2016   IR EMBO ARTERIAL NOT HEMORR HEMANG INC GUIDE ROADMAPPING 11/28/2016 Jacqulynn Cadet, MD [facility]  . IR GENERIC HISTORICAL  11/28/2016   IR ANGIOGRAM VISCERAL SELECTIVE 11/28/2016 Jacqulynn Cadet, MD [facility]  . IR GENERIC HISTORICAL  11/28/2016   IR ANGIOGRAM SELECTIVE EACH ADDITIONAL VESSEL 11/28/2016 Jacqulynn Cadet, MD [facility]  . IR GENERIC HISTORICAL  11/13/2016   IR RADIOLOGIST EVAL & MGMT 11/13/2016 Jacqulynn Cadet, MD [facility]  . PORTACATH PLACEMENT Right 03/31/2015   Procedure: INSERTION PORT-A-CATH;  Surgeon: Ivin Poot, MD;  Location: [facility];  Service: Thoracic;  Laterality: Right;  . 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[place]); HLD (hyperlipidemia); History of tobacco use; Essential hypertension; Hemorrhoids; DIVERTICULOSIS, COLON; FATTY LIVER DISEASE; Medicare annual wellness visit, subsequent; Positive hepatitis C antibody test; Systolic murmur; Lower back pain; Polycythemia; BPH (benign prostatic hypertrophy); Right knee pain; Personal history of colonic adenoma; Cough; DOE (dyspnea on exertion); Adenocarcinoma of left lung, stage 4 ([place]); Recurrent left pleural effusion; Skin rash; 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[place]); CHF (congestive heart failure) ([place]); Thrombocytopenia ([place]); Peripheral edema; Orthostatic hypotension; Neoplastic malignant related fatigue; Paroxysmal a-fib ([place]); Hypothyroid; Hearing loss; Advanced care planning/counseling discussion; Bilateral leg pain; Encounter for antineoplastic chemotherapy; Myalgia; Neutropenia, drug-induced ([place]); Altered mental state; Constipation; Long term current use of anticoagulant therapy; Esophagitis; Epistaxis; Dehydration; Full code status; Hypoalbuminemia due to protein-calorie malnutrition ([place]); Hypoglycemia; Aortic stenosis, moderate; Antineoplastic chemotherapy induced anemia; Sacral pressure ulcer; Central  line complication; Diabetes mellitus type 2, controlled, with complications ([place]); Loss of weight; Port catheter in place; Stroke (cerebrum) ([place]); Neck pain, musculoskeletal; Rhinorrhea; Hepatocellular carcinoma ([place]); Encounter for antineoplastic immunotherapy; Dry ARMD; Cancer associated pain; Hyperkalemia; and Renal insufficiency on his problem list.    is allergic to statins; diltiazem hcl; doxazosin mesylate; hydrocodone; nifedipine; red yeast rice; and sertraline.  Allergies as of 12/11/2016      Reactions   Statins Other (See Comments)   All statins cause muscle  aches   Diltiazem Hcl Other (See Comments)   Dizziness   Doxazosin Mesylate Other (See Comments)   Headaches   Hydrocodone Other (See Comments)   constipation   Nifedipine Swelling   Leg swelling   Red Yeast Rice Other (See Comments)   Muscle aches   Sertraline Other (See Comments)   oversedation      Medication List       Accurate as of 12/11/16 11:59 PM. Always use your most recent med list.          acetaminophen 500 MG tablet Commonly known as:  TYLENOL Take 500 mg by mouth 2 (two) times daily. Reported on 05/06/2016   albuterol 108 (90 Base) MCG/ACT inhaler Commonly known as:  PROVENTIL HFA;VENTOLIN HFA Inhale 2 puffs into the lungs every 6 (six) hours as needed for wheezing or shortness of breath.   benzonatate 100 MG capsule Commonly known as:  TESSALON Take 1-2 capsules (100-200 mg total) by mouth 3 (three) times daily as needed for cough.   ELIQUIS 5 MG Tabs tablet Generic drug:  apixaban TAKE ONE TABLET BY MOUTH TWICE DAILY   glipiZIDE 5 MG tablet Commonly known as:  GLUCOTROL Take 1 tablet (5 mg total) by mouth daily before breakfast.   glucose blood test strip Commonly known as:  ONE TOUCH ULTRA TEST Use to check sugar once daily and as needed. Dx: E09.9   guaiFENesin 600 MG 12 hr tablet Commonly known as:  MUCINEX Take 600 mg by mouth 2 (two) times daily as needed for cough or to loosen phlegm.   hydrocortisone 25 MG suppository Commonly known as:  ANUSOL-HC Place 1 suppository (25 mg total) rectally 2 (two) times daily.   ICAPS Caps Take 1 capsule by mouth 2 (two) times daily.   KLOR-CON M10 10 MEQ tablet Generic drug:  potassium chloride Take 2 tablets (20 mEq total) by mouth every other day.   lidocaine-prilocaine cream Commonly known as:  EMLA Apply 1 application topically as needed.   metoprolol 50 MG tablet Commonly known as:  LOPRESSOR Take 0.5 tablets (25 mg total) by mouth 2 (two) times daily.   ONETOUCH DELICA LANCETS 85I  Misc Use to check sugar once daily and as needed. Dx:E09.9   polyethylene glycol packet Commonly known as:  MIRALAX / GLYCOLAX Take 8.5 g by mouth daily.   ROBAXIN 500 MG tablet Generic drug:  methocarbamol Take 500 mg by mouth 3 (three) times daily as needed. Muscle pain   spironolactone 50 MG tablet Commonly known as:  ALDACTONE Take 1 tablet (50 mg total) by mouth daily.   sucralfate 1 g tablet Commonly known as:  CARAFATE Take 1 tablet (1 g total) by mouth 3 (three) times daily before meals.   torsemide 20 MG tablet Commonly known as:  DEMADEX Take 1 tablet (20 mg total) by mouth every other day.   traMADol 50 MG tablet Commonly known as:  ULTRAM Take 1 tablet (50 mg total) by mouth every 6 (six)  hours as needed.        PHYSICAL EXAMINATION  Oncology Vitals 12/11/2016 12/03/2016  Height 175 cm 175 cm  Weight 76.34 kg 78.518 kg  Weight (lbs) 168 lbs 5 oz 173 lbs 2 oz  BMI (kg/m2) 24.85 kg/m2 25.56 kg/m2  Temp 97.4 (No Data)  Pulse 72 83  Resp 17 18  SpO2 98 97  BSA (m2) 1.93 m2 1.95 m2   BP Readings from Last 2 Encounters:  12/11/16 131/85  12/03/16 (!) 144/82    Physical Exam  Constitutional: He is oriented to person, place, and time. Vital signs are normal. He appears malnourished and dehydrated. He appears unhealthy. He appears cachectic.  HENT:  Head: Normocephalic and atraumatic.  Mouth/Throat: Oropharynx is clear and moist.  Eyes: Conjunctivae and EOM are normal. Pupils are equal, round, and reactive to light. Right eye exhibits no discharge. Left eye exhibits no discharge. No scleral icterus.  Neck: Normal range of motion. Neck supple. No JVD present. No tracheal deviation present. No thyromegaly present.  Cardiovascular: Normal rate, regular rhythm, normal heart sounds and intact distal pulses.   Pulmonary/Chest: Effort normal and breath sounds normal. No respiratory distress. He has no wheezes. He has no rales. He exhibits no tenderness.   Abdominal: Soft. Bowel sounds are normal. He exhibits no distension and no mass. There is tenderness. There is no rebound and no guarding.  Trace right upper quadrant tenderness.  Musculoskeletal: Normal range of motion. He exhibits no edema, tenderness or deformity.  Lymphadenopathy:    He has no cervical adenopathy.  Neurological: He is alert and oriented to person, place, and time. Gait normal.  Skin: Skin is warm and dry. No rash noted. No erythema. There is pallor.  Psychiatric: Affect normal.  Nursing note and vitals reviewed.   LABORATORY DATA:. Appointment on 12/11/2016  Component Date Value Ref Range Status  . TSH 12/11/2016 10.186* 0.320 - 4.118 m(IU)/L Final  . WBC 12/11/2016 7.1  4.0 - 10.3 10e3/uL Final  . NEUT# 12/11/2016 5.8  1.5 - 6.5 10e3/uL Final  . HGB 12/11/2016 16.7  13.0 - 17.1 g/dL Final  . HCT 12/11/2016 53.0* 38.4 - 49.9 % Final  . Platelets 12/11/2016 276  140 - 400 10e3/uL Final  . MCV 12/11/2016 85.5  79.3 - 98.0 fL Final  . MCH 12/11/2016 26.9* 27.2 - 33.4 pg Final  . MCHC 12/11/2016 31.5* 32.0 - 36.0 g/dL Final  . RBC 12/11/2016 6.20* 4.20 - 5.82 10e6/uL Final  . RDW 12/11/2016 17.2* 11.0 - 14.6 % Final  . lymph# 12/11/2016 0.5* 0.9 - 3.3 10e3/uL Final  . MONO# 12/11/2016 0.6  0.1 - 0.9 10e3/uL Final  . Eosinophils Absolute 12/11/2016 0.1  0.0 - 0.5 10e3/uL Final  . Basophils Absolute 12/11/2016 0.0  0.0 - 0.1 10e3/uL Final  . NEUT% 12/11/2016 81.9* 39.0 - 75.0 % Final  . LYMPH% 12/11/2016 7.6* 14.0 - 49.0 % Final  . MONO% 12/11/2016 8.3  0.0 - 14.0 % Final  . EOS% 12/11/2016 1.8  0.0 - 7.0 % Final  . BASO% 12/11/2016 0.4  0.0 - 2.0 % Final  . Sodium 12/11/2016 133* 136 - 145 mEq/L Final  . Potassium 12/11/2016 5.4* 3.5 - 5.1 mEq/L Final  . Chloride 12/11/2016 98  98 - 109 mEq/L Final  . CO2 12/11/2016 24  22 - 29 mEq/L Final  . Glucose 12/11/2016 218* 70 - 140 mg/dl Final  . BUN 12/11/2016 44.5* 7.0 - 26.0 mg/dL Final  . Creatinine 12/11/2016  2.0* 0.7 - 1.3 mg/dL Final  . Total Bilirubin 12/11/2016 1.19  0.20 - 1.20 mg/dL Final  . Alkaline Phosphatase 12/11/2016 264* 40 - 150 U/L Final  . AST 12/11/2016 85* 5 - 34 U/L Final  . ALT 12/11/2016 56* 0 - 55 U/L Final  . Total Protein 12/11/2016 7.2  6.4 - 8.3 g/dL Final  . Albumin 12/11/2016 2.8* 3.5 - 5.0 g/dL Final  . Calcium 12/11/2016 9.2  8.4 - 10.4 mg/dL Final  . Anion Gap 12/11/2016 12* 3 - 11 mEq/L Final  . EGFR 12/11/2016 32* >90 ml/min/1.73 m2 Final    RADIOGRAPHIC STUDIES: No results found.  ASSESSMENT/PLAN:    Renal insufficiency Patient's creatinine has increased to 2.0 today.  Most likely, this is secondary to patient's mild dehydration.  Will continue to monitor closely.  Hyperkalemia Potassium has increased to 5.4.  Confirmed the patient is not taking any potassium tablets, or other supplements at this time.  Most likely, hyperkalemia secondary to dehydration.  Will continue to monitor closely.  Hepatocellular carcinoma ([place]) Patient received cycle 5 of his Keytruda infusion on 11/21/2016.  Patient was scheduled for a liver radio embolization recently; but it was canceled secondary to an elevated bilirubin.  Patient complains of increased pain to the right upper quadrant region.  Labs obtained today revealed that the bilirubin has improved from 1.97 down to 1.19.  AST has increased from 77 up to 85.  However, ALT has improved from 73 down to 56.  Reviewed all findings with Dr. [name]; and then this provider called Dr. Laurence Ferrari interventional radiologist with [facility] imaging.  He reviewed all of patient's lab results; and also noted that patient has become increasingly weak and more fatigued.  Patient states that he is x-ray spent most of this last week in the bed and has had minimal to no appetite.  Given the lab results and patient's continued declining health-Dr. Laurence Ferrari recommended that any future Y-90 liver radio embolization be postponed; secondary to  the fact that it could create even worsening affects and possible liver failure in the future.  Dr. Laurence Ferrari also stated, the a phone call that he had called and talked to the patient's wife at length; and discussed all of this in detail.  Dr. Laurence Ferrari stated that patient's wife was in full agreement with the cancellation of the embolization procedure.  The plan is for the patient to return on 12/18/16 to review all with Dr. [name] in regards to further treatment plan.      Dehydration Patient has had decreased oral intake and appears dehydrated today.  Sodium was down to 133.  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  Advised patient that he could return to the [facility] any other time this week to receive additional IV fluid rehydration.  If he felt needed.  Adenocarcinoma of left lung, stage 4 ([place]) Patient received cycle 5 of his Keytruda infusion on 11/21/2016.  Patient was scheduled for a liver radio embolization recently; but it was canceled secondary to an elevated bilirubin.  Patient complains of increased pain to the right upper quadrant region.  Labs obtained today revealed that the bilirubin has improved from 1.97 down to 1.19.  AST has increased from 77 up to 85.  However, ALT has improved from 73 down to 56.  Reviewed all findings with Dr. [name]; and then this provider  called Dr. Laurence Ferrari interventional radiologist with [facility] imaging.  He reviewed all of patient's lab results; and also noted that patient has become increasingly weak and more fatigued.  Patient states that he is x-ray spent most of this last week in the bed and has had minimal to no appetite.  Given the lab results and patient's continued declining health-Dr. Laurence Ferrari recommended that any future Y-90 liver radio embolization be postponed; secondary to the  fact that it could create even worsening affects and possible liver failure in the future.  Dr. Laurence Ferrari also stated, the a phone call that he had called and talked to the patient's wife at length; and discussed all of this in detail.  Dr. Laurence Ferrari stated that patient's wife was in full agreement with the cancellation of the embolization procedure.  The plan is for the patient to return on 12/18/16 to review all with Dr. [name] in regards to further treatment plan.   Patient stated understanding of all instructions; and was in agreement with this plan of care. The patient knows to call the clinic with any problems, questions or concerns.   Total time spent with patient was 40 minutes;  with greater than 75 percent of that time spent in face to face counseling regarding patient's symptoms,  and coordination of care and follow up.  Disclaimer:This dictation was prepared with Dragon/digital dictation along with [employer]. Any transcriptional errors that result from this process are unintentional.  Drue Second, NP 12/12/2016   ADDENDUM: Hematology/oncology Attending: I had a face to face encounter with the patient. I recommended his care plan. This is a very pleasant 75 years old white male with multiple medical problems and currently undergoing treatment for stage IV non-small cell lung cancer as well as hepatocellular carcinoma. There was evidence for disease progression in the liver and the patient was considered for treatment with Y 90 liver radioembolization. Unfortunately there is worsening of his liver function and the patient is not a candidate for the procedure anymore because of concern about liver failure. He has been complaining of significant pain in the right upper quadrant likely secondary to progression of his disease. He was started on treatment with oxycodone but make him sleepy most of the time. He would like to change his pain medication to a different drug. He  tolerated tramadol much better in the past. We will start the patient on tramadol for his pain management. For dehydration, who recommended for the patient IV hydration at the [facility] but he has an event with his friends at the golf club and declined to stay for the IV hydration. We will arrange for the patient to come back for follow-up visit after repeating CT scan of the chest, abdomen and pelvis for restaging of his disease before considering other options for his treatment. He was advised to call immediately if he has any concerning symptoms in the interval.  Disclaimer: This note was dictated with voice recognition software. Similar sounding words can inadvertently be transcribed and may be missed upon review. Eilleen Kempf., MD 12/13/16

## 2016-12-12 NOTE — Assessment & Plan Note (Signed)
Potassium has increased to 5.4.  Confirmed the patient is not taking any potassium tablets, or other supplements at this time.  Most likely, hyperkalemia secondary to dehydration.  Will continue to monitor closely.

## 2016-12-13 ENCOUNTER — Telehealth: Payer: Self-pay | Admitting: Nurse Practitioner

## 2016-12-13 ENCOUNTER — Other Ambulatory Visit: Payer: Self-pay | Admitting: Nurse Practitioner

## 2016-12-13 NOTE — Telephone Encounter (Signed)
Called and spoke to patient's wife to follow-up on the patient.  Advised patient's wife that patient's thyroid level was increased; and Dr. [name] had called in a new prescription for Synthroid for them to pick up and start as directed.  Also, patient was given an increased amount of tramadol pain medication; but this provider was advised that it was a few days to early to pick up further tramadol, so it would be covered by his insurance.  This provider authorized patient to pick up the tramadol early if needed.  The pharmacy tech advised that the patient could pay $11 cash price for the tramadol; or could wait until 12/15/2016 to pick it up and it would be covered by his insurance.

## 2016-12-17 ENCOUNTER — Other Ambulatory Visit: Payer: Self-pay | Admitting: Family Medicine

## 2016-12-18 ENCOUNTER — Ambulatory Visit (HOSPITAL_BASED_OUTPATIENT_CLINIC_OR_DEPARTMENT_OTHER): Payer: Medicare Other | Admitting: Internal Medicine

## 2016-12-18 ENCOUNTER — Ambulatory Visit (HOSPITAL_BASED_OUTPATIENT_CLINIC_OR_DEPARTMENT_OTHER): Payer: Medicare Other

## 2016-12-18 ENCOUNTER — Telehealth: Payer: Self-pay | Admitting: *Deleted

## 2016-12-18 ENCOUNTER — Encounter: Payer: Self-pay | Admitting: Internal Medicine

## 2016-12-18 ENCOUNTER — Telehealth: Payer: Self-pay | Admitting: Internal Medicine

## 2016-12-18 ENCOUNTER — Ambulatory Visit: Payer: Medicare Other

## 2016-12-18 ENCOUNTER — Other Ambulatory Visit (HOSPITAL_BASED_OUTPATIENT_CLINIC_OR_DEPARTMENT_OTHER): Payer: Medicare Other

## 2016-12-18 VITALS — BP 132/88 | HR 78 | Resp 18 | Ht 69.0 in | Wt 168.3 lb

## 2016-12-18 DIAGNOSIS — Z5112 Encounter for antineoplastic immunotherapy: Secondary | ICD-10-CM

## 2016-12-18 DIAGNOSIS — E860 Dehydration: Secondary | ICD-10-CM

## 2016-12-18 DIAGNOSIS — C220 Liver cell carcinoma: Secondary | ICD-10-CM

## 2016-12-18 DIAGNOSIS — I4891 Unspecified atrial fibrillation: Secondary | ICD-10-CM

## 2016-12-18 DIAGNOSIS — C3492 Malignant neoplasm of unspecified part of left bronchus or lung: Secondary | ICD-10-CM

## 2016-12-18 DIAGNOSIS — G893 Neoplasm related pain (acute) (chronic): Secondary | ICD-10-CM

## 2016-12-18 DIAGNOSIS — C3491 Malignant neoplasm of unspecified part of right bronchus or lung: Secondary | ICD-10-CM

## 2016-12-18 DIAGNOSIS — R600 Localized edema: Secondary | ICD-10-CM

## 2016-12-18 DIAGNOSIS — R109 Unspecified abdominal pain: Secondary | ICD-10-CM | POA: Diagnosis not present

## 2016-12-18 LAB — COMPREHENSIVE METABOLIC PANEL
ALT: 59 U/L — AB (ref 0–55)
ANION GAP: 10 meq/L (ref 3–11)
AST: 91 U/L — ABNORMAL HIGH (ref 5–34)
Albumin: 2.9 g/dL — ABNORMAL LOW (ref 3.5–5.0)
Alkaline Phosphatase: 225 U/L — ABNORMAL HIGH (ref 40–150)
BILIRUBIN TOTAL: 1.4 mg/dL — AB (ref 0.20–1.20)
BUN: 32.2 mg/dL — ABNORMAL HIGH (ref 7.0–26.0)
CALCIUM: 8.8 mg/dL (ref 8.4–10.4)
CO2: 20 meq/L — AB (ref 22–29)
CREATININE: 1.5 mg/dL — AB (ref 0.7–1.3)
Chloride: 103 mEq/L (ref 98–109)
EGFR: 45 mL/min/{1.73_m2} — ABNORMAL LOW (ref >90)
Glucose: 259 mg/dl — ABNORMAL HIGH (ref 70–140)
Potassium: 4.8 mEq/L (ref 3.5–5.1)
Sodium: 134 mEq/L — ABNORMAL LOW (ref 136–145)
TOTAL PROTEIN: 6.7 g/dL (ref 6.4–8.3)

## 2016-12-18 LAB — CBC WITH DIFFERENTIAL/PLATELET
BASO%: 0.6 % (ref 0.0–2.0)
Basophils Absolute: 0.1 10*3/uL (ref 0.0–0.1)
EOS%: 1.4 % (ref 0.0–7.0)
Eosinophils Absolute: 0.1 10*3/uL (ref 0.0–0.5)
HEMATOCRIT: 50.1 % — AB (ref 38.4–49.9)
HGB: 15.8 g/dL (ref 13.0–17.1)
LYMPH%: 8.3 % — ABNORMAL LOW (ref 14.0–49.0)
MCH: 26.9 pg — ABNORMAL LOW (ref 27.2–33.4)
MCHC: 31.5 g/dL — ABNORMAL LOW (ref 32.0–36.0)
MCV: 85.2 fL (ref 79.3–98.0)
MONO#: 0.5 10*3/uL (ref 0.1–0.9)
MONO%: 6.2 % (ref 0.0–14.0)
NEUT%: 83.5 % — ABNORMAL HIGH (ref 39.0–75.0)
NEUTROS ABS: 6.6 10*3/uL — AB (ref 1.5–6.5)
PLATELETS: 225 10*3/uL (ref 140–400)
RBC: 5.88 10*6/uL — ABNORMAL HIGH (ref 4.20–5.82)
RDW: 18.1 % — AB (ref 11.0–14.6)
WBC: 7.9 10*3/uL (ref 4.0–10.3)
lymph#: 0.7 10*3/uL — ABNORMAL LOW (ref 0.9–3.3)

## 2016-12-18 MED ORDER — PEMBROLIZUMAB CHEMO INJECTION 100 MG/4ML
200.0000 mg | Freq: Once | INTRAVENOUS | Status: AC
  Administered 2016-12-18: 200 mg via INTRAVENOUS
  Filled 2016-12-18: qty 8

## 2016-12-18 MED ORDER — SODIUM CHLORIDE 0.9% FLUSH
10.0000 mL | INTRAVENOUS | Status: DC | PRN
  Administered 2016-12-18: 10 mL
  Filled 2016-12-18: qty 10

## 2016-12-18 MED ORDER — SODIUM CHLORIDE 0.9 % IV SOLN
INTRAVENOUS | Status: DC
  Administered 2016-12-18: 10:00:00 via INTRAVENOUS

## 2016-12-18 MED ORDER — SODIUM CHLORIDE 0.9 % IV SOLN
Freq: Once | INTRAVENOUS | Status: AC
  Administered 2016-12-18: 11:00:00 via INTRAVENOUS

## 2016-12-18 MED ORDER — HEPARIN SOD (PORK) LOCK FLUSH 100 UNIT/ML IV SOLN
500.0000 [IU] | Freq: Once | INTRAVENOUS | Status: AC | PRN
  Administered 2016-12-18: 500 [IU]
  Filled 2016-12-18: qty 5

## 2016-12-18 NOTE — Patient Instructions (Signed)
[facility] Discharge Instructions for Patients Receiving Chemotherapy  Today you received the following chemotherapy agents: Keytruda   To help prevent nausea and vomiting after your treatment, we encourage you to take your nausea medication as directed    If you develop nausea and vomiting that is not controlled by your nausea medication, call the clinic.   BELOW ARE SYMPTOMS THAT SHOULD BE REPORTED IMMEDIATELY:  *FEVER GREATER THAN 100.5 F  *CHILLS WITH OR WITHOUT FEVER  NAUSEA AND VOMITING THAT IS NOT CONTROLLED WITH YOUR NAUSEA MEDICATION  *UNUSUAL SHORTNESS OF BREATH  *UNUSUAL BRUISING OR BLEEDING  TENDERNESS IN MOUTH AND THROAT WITH OR WITHOUT PRESENCE OF ULCERS  *URINARY PROBLEMS  *BOWEL PROBLEMS  UNUSUAL RASH Items with * indicate a potential emergency and should be followed up as soon as possible.  Feel free to call the clinic you have any questions or concerns. The clinic phone number is [phone].  Please show the CHEMO ALERT CARD at check-in to the Emergency Department and triage nurse.   

## 2016-12-18 NOTE — Progress Notes (Signed)
[facility] [phone]   Fax:([phone]) [place], MD [place] [address]  DIAGNOSIS:  1) metastatic non-small cell lung cancer initially diagnosed as stage IIIB adenocarcinoma in March 2015. 2) unresectable hepatocellular carcinoma diagnosed in August 2017.  PRIOR THERAPY: 1) Systemic chemotherapy with carboplatin for AUC of 5 and Alimta 500 mg/M2 every 3 weeks. First dose expected on 04/13/2014. Status post 6 cycles. 2)  Maintenance chemotherapy with single agent Alimta 500 mg/M2 every 3 weeks. First cycle on 09/06/2014. Status post 3 cycles. 3) Second course of systemic chemotherapy with carboplatin for AUC of 5 and Alimta 500 MG/M2 every 3 weeks. First cycle 11/08/2014. Status post 6 cycles. 4)  Immunotherapy with Nivolumab 3 MG/KG every 2 weeks. First dose 05/24/2015. Status post 3 cycles. 5) Systemic chemotherapy with docetaxel 60 MG/M2 and Cyramza 10 MG/KG every 3 weeks status post 6 cycles. Last dose was given 11/21/2015 with stable disease. 6) Systemic chemotherapy with docetaxel 75 MG/M2 every 3 weeks. First cycle 05/08/2016. Status post one cycle. Discontinued secondary to intolerance to the Neulasta injection. 7) Systemic chemotherapy with docetaxel 25 MG/M2 every weekly. First cycle 05/29/2016. Status post 9 cycles, last dose was given 07/24/2016. Discontinued secondary to disease progression.  CURRENT THERAPY: Ketruda (pembrolizumab) 200 mg IV every 3 weeks, first dose was given on 08/29/2016, status post 5 cycles.  INTERVAL HISTORY: Shawn Espinoza 75 y.o. male returns to the clinic today for follow-up visit accompanied by his wife. The patient is feeling a little bit better today. He enjoyed his Christmas with his family. He continues to have right upper quadrant abdominal pain but much well-tolerated with tramadol and less severe than a few weeks ago. He was seen that was sent to  by interventional radiology and the patient was not a good candidate for the Y 90 radioembolization for concern of liver failure because of his worsening hepatic dysfunction. He denied having any nausea, vomiting, diarrhea or constipation. He denied having any significant weight loss or night sweats. He has no fever or chills. He denied having any significant chest pain but continues to have shortness breath with exertion with no cough or hemoptysis. He is here today for evaluation before starting cycle #6 of his treatment with immunotherapy.  MEDICAL HISTORY: Past Medical History:  Diagnosis Date  . Acute infarction of intestine, part and extent unspecified ([facility]) 04/25/2016  . 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 Arthritis   . Atrial fibrillation ([facility])   . Cancer associated pain 12/03/2016  . Constipation   . Depression   . Diabetes mellitus type 2 with retinopathy ([place] [facility])   . Diverticulosis of colon   . Dysrhythmia    Atrial fib (found 07/2014)  . Fatty liver 02/29/00   abd ultrasound  . Full code status 09/26/2015  . GERD (gastroesophageal reflux disease)    occasional  . HCAP (healthcare-associated pneumonia) 11/27/2015   IV vanc/zosyn then levaquin courses x2   . Hepatocellular carcinoma ([facility]) 08/21/2016  . History of diabetes mellitus, type II    resolved with diet, h/o neuropathy  . History of tobacco use quit 1990s  . Hyperlipidemia   . Hypertension   . Hypertensive retinopathy of both eyes 2015   mild  . Lobar pneumonia ([facility]) 10/18/2015  . Lower back pain   . 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  Neuropathy ([facility])   . Non-small cell carcinoma of left lung ([facility]) 02/2014   stage IIIb/IV on chemo  . Personal history of colonic adenomas 07/06/2013  . Pneumonia   . 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 Shortness of breath   . 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  Systolic murmur 7353   2Decho - normal LV fxn, EF 55%, mild AS, biatrial enlargement    ALLERGIES:  is allergic to statins; diltiazem hcl; doxazosin mesylate; hydrocodone; nifedipine; red yeast rice; and sertraline.  MEDICATIONS:  Current Outpatient Prescriptions  Medication Sig Dispense Refill  . acetaminophen (TYLENOL) 500 MG tablet Take 500 mg by mouth 2 (two) times daily. Reported on 05/06/2016    . albuterol (PROVENTIL HFA;VENTOLIN HFA) 108 (90 BASE) MCG/ACT inhaler Inhale 2 puffs into the lungs every 6 (six) hours as needed for wheezing or shortness of breath. (Patient not taking: Reported on 12/11/2016) 1 Inhaler 3  . benzonatate (TESSALON) 100 MG capsule Take 1-2 capsules (100-200 mg total) by mouth 3 (three) times daily as needed for cough. (Patient not taking: Reported on 12/11/2016) 40 capsule 1  . ELIQUIS 5 MG TABS tablet TAKE ONE TABLET BY MOUTH TWICE DAILY 180 tablet 1  . glipiZIDE (GLUCOTROL) 5 MG tablet Take 1 tablet (5 mg total) by mouth daily before breakfast. (Patient not taking: Reported on 12/11/2016) 30 tablet 6  . glucose blood (ONE TOUCH ULTRA TEST) test strip Use to check sugar once daily and as needed. Dx: E09.9 100 each 3  . guaiFENesin (MUCINEX) 600 MG 12 hr tablet Take 600 mg by mouth 2 (two) times daily as needed for cough or to loosen phlegm.     . hydrocortisone (ANUSOL-HC) 25 MG suppository Place 1 suppository (25 mg total) rectally 2 (two) times daily. (Patient not taking: Reported on 12/11/2016) 12 suppository 0  . KLOR-CON M10 10 MEQ tablet Take 2 tablets (20 mEq total) by mouth every other day. 30 tablet 6  . levothyroxine (SYNTHROID) 50 MCG tablet Take 1 tablet (50 mcg total) by mouth daily before breakfast. 30 tablet 2  . lidocaine-prilocaine (EMLA) cream Apply 1 application topically as needed. (Patient not taking: Reported on 12/11/2016) 30 g 1  . methocarbamol (ROBAXIN) 500 MG tablet Take 500 mg by mouth 3 (three) times daily as needed. Muscle pain    .  metoprolol (LOPRESSOR) 50 MG tablet TAKE ONE-HALF TABLET BY MOUTH TWICE DAILY 90 tablet 1  . Multiple Vitamins-Minerals (ICAPS) CAPS Take 1 capsule by mouth 2 (two) times daily.     [name] DELICA LANCETS 29J MISC Use to check sugar once daily and as needed. Dx:E09.9 100 each 3  . polyethylene glycol (MIRALAX / GLYCOLAX) packet Take 8.5 g by mouth daily.     Marland Kitchen spironolactone (ALDACTONE) 50 MG tablet Take 1 tablet (50 mg total) by mouth daily.    . sucralfate (CARAFATE) 1 g tablet Take 1 tablet (1 g total) by mouth 3 (three) times daily before meals. (Patient not taking: Reported on 12/11/2016) 90 tablet 1  . torsemide (DEMADEX) 20 MG tablet Take 1 tablet (20 mg total) by mouth every other day. 30 tablet 6  . traMADol (ULTRAM) 50 MG tablet Take 1 tablet (50 mg total) by mouth every 6 (six) hours as needed. 60 tablet 3   No current facility-administered medications for this visit.     SURGICAL HISTORY:  Past Surgical History:  Procedure Laterality Date  . CATARACT  EXTRACTION Right 03/2015   Dr [name]  . CHEST TUBE INSERTION Left 08/26/2014   Procedure: INSERTION OF LEFT  PLEURAL DRAINAGE CATHETER;  Surgeon: Ivin Poot, MD;  Location: [facility];  Service: Thoracic;  Laterality: Left;  . COLONOSCOPY  2004  . COLONOSCOPY  2014   tubular adenoma x1, mod diverticulosis ([facility])  . FLEXIBLE BRONCHOSCOPY  02/2014   WNL  . IR GENERIC HISTORICAL  11/28/2016   IR US GUIDE VASC ACCESS RIGHT 11/28/2016 Jacqulynn Cadet, MD [facility]  . IR GENERIC HISTORICAL  11/28/2016   IR ANGIOGRAM SELECTIVE EACH ADDITIONAL VESSEL 11/28/2016 Jacqulynn Cadet, MD [facility]  . IR GENERIC HISTORICAL  11/28/2016   IR ANGIOGRAM VISCERAL SELECTIVE 11/28/2016 Jacqulynn Cadet, MD [facility]  . IR GENERIC HISTORICAL  11/28/2016   IR ANGIOGRAM SELECTIVE EACH ADDITIONAL VESSEL 11/28/2016 Jacqulynn Cadet, MD [facility]  . IR GENERIC HISTORICAL  11/28/2016   IR ANGIOGRAM SELECTIVE EACH ADDITIONAL VESSEL 11/28/2016  Jacqulynn Cadet, MD [facility]  . IR GENERIC HISTORICAL  11/28/2016   IR ANGIOGRAM SELECTIVE EACH ADDITIONAL VESSEL 11/28/2016 Jacqulynn Cadet, MD [facility]  . IR GENERIC HISTORICAL  11/28/2016   IR EMBO ARTERIAL NOT HEMORR HEMANG INC GUIDE ROADMAPPING 11/28/2016 Jacqulynn Cadet, MD [facility]  . IR GENERIC HISTORICAL  11/28/2016   IR ANGIOGRAM VISCERAL SELECTIVE 11/28/2016 Jacqulynn Cadet, MD [facility]  . IR GENERIC HISTORICAL  11/28/2016   IR ANGIOGRAM SELECTIVE EACH ADDITIONAL VESSEL 11/28/2016 Jacqulynn Cadet, MD [facility]  . IR GENERIC HISTORICAL  11/13/2016   IR RADIOLOGIST EVAL & MGMT 11/13/2016 Jacqulynn Cadet, MD [facility]  . PORTACATH PLACEMENT Right 03/31/2015   Procedure: INSERTION PORT-A-CATH;  Surgeon: Ivin Poot, MD;  Location: [facility];  Service: Thoracic;  Laterality: Right;  . REMOVAL OF PLEURAL DRAINAGE CATHETER Left 03/31/2015   Procedure: REMOVAL OF PLEURAL DRAINAGE CATHETER;  Surgeon: Ivin Poot, MD;  Location: [facility];  Service: Thoracic;  Laterality: Left;  Marland Kitchen VIDEO BRONCHOSCOPY Bilateral 03/17/2014   Procedure: VIDEO BRONCHOSCOPY WITH FLUORO;  Surgeon: Tanda Rockers, MD;  [facility];  Service: Cardiopulmonary;  Laterality: Bilateral;    REVIEW OF SYSTEMS:  Constitutional: positive for fatigue Eyes: negative Ears, nose, mouth, throat, and face: negative Respiratory: positive for dyspnea on exertion Cardiovascular: negative Gastrointestinal: negative Genitourinary:negative Integument/breast: negative Hematologic/lymphatic: negative Musculoskeletal:negative Neurological: negative Behavioral/Psych: negative Endocrine: negative Allergic/Immunologic: negative   PHYSICAL EXAMINATION: General appearance: alert, cooperative, fatigued and no distress Head: Normocephalic, without obvious abnormality, atraumatic Neck: no adenopathy, no JVD, supple, symmetrical, trachea midline and thyroid not enlarged, symmetric, no  tenderness/mass/nodules Lymph nodes: Cervical, supraclavicular, and axillary nodes normal. Resp: clear to auscultation bilaterally Back: symmetric, no curvature. ROM normal. No CVA tenderness. Cardio: regular rate and rhythm, S1, S2 normal, no murmur, click, rub or gallop GI: Tenderness to palpation at the right upper quadrant of the abdomen. Extremities: extremities normal, atraumatic, no cyanosis or edema Neurologic: Alert and oriented X 3, normal strength and tone. Normal symmetric reflexes. Normal coordination and gait  ECOG PERFORMANCE STATUS: 1 - Symptomatic but completely ambulatory  There were no vitals taken for this visit.  LABORATORY DATA: Lab Results  Component Value Date   WBC 7.1 12/11/2016   HGB 16.7 12/11/2016   HCT 53.0 (H) 12/11/2016   MCV 85.5 12/11/2016   PLT 276 12/11/2016      Chemistry      Component Value Date/Time   NA 133 (L) 12/11/2016 0914   K 5.4 (H) 12/11/2016 0086  CL 103 11/28/2016 0755   CO2 24 12/11/2016 0914   BUN 44.5 (H) 12/11/2016 0914   CREATININE 2.0 (H) 12/11/2016 0914      Component Value Date/Time   CALCIUM 9.2 12/11/2016 0914   ALKPHOS 264 (H) 12/11/2016 0914   AST 85 (H) 12/11/2016 0914   ALT 56 (H) 12/11/2016 0914   BILITOT 1.19 12/11/2016 0914       RADIOGRAPHIC STUDIES: Dg Chest 2 View  Result Date: 11/27/2016 CLINICAL DATA:  History of lung carcinoma. History of pleural effusion. EXAM: CHEST  2 VIEW COMPARISON:  CT, 10/29/2016 chest radiographs, 12/28/2015 FINDINGS: The cardiac silhouette is normal in size and configuration. No mediastinal or hilar masses. No convincing adenopathy. Lungs are mildly hyperexpanded but clear. The opacities noted in the left lung base on the prior chest radiograph have essentially resolved. No pleural effusion.  No pneumothorax. Right anterior chest wall Port-A-Cath is well positioned and stable. Skeletal structures are demineralized but intact. IMPRESSION: 1. No acute cardiopulmonary  disease. 2. No pleural effusion. Electronically Signed   By: Lajean Manes M.D.   On: 11/27/2016 10:49   Nm Liver Img Spect  Result Date: 11/29/2016 CLINICAL DATA:  Hepatocellular carcinoma. EXAM: NUCLEAR MEDICINE LIVER SCAN TECHNIQUE: Abdominal images were obtained in multiple projections after intrahepatic arterial injection of radiopharmaceutical. SPECT imaging was performed. Lung shunt calculation was performed. RADIOPHARMACEUTICALS:  5.23mllicurie MAA TECHNETIUM TO 52M ALBUMIN AGGREGATED COMPARISON:  CT from 10/29/2016. FINDINGS: The injected microaggregated albumin localizes within the liver. No evidence of activity within the stomach, duodenum, or bowel. Calculated shunt fraction to the lungs equals 11.2%. IMPRESSION: 1. No significant extrahepatic radiotracer activity following intrahepatic arterial injection of MAA. 2. Lung shunt fraction equals 11.2% Electronically Signed   By: TKerby MoorsM.D.   On: 11/29/2016 16:10   Ir Angiogram Visceral Selective  Result Date: 12/09/2016 INDICATION: 75year old male with multifocal hepatocellular carcinoma. Pre Y 90 arterial mapping and lung shunt fraction calculation. EXAM: IR EMBO ARTERIAL NOT HEMORR HEMANG INC GUIDE ROADMAPPING; IR ULTRASOUND GUIDANCE VASC ACCESS RIGHT; ADDITIONAL ARTERIOGRAPHY; SELECTIVE VISCERAL ARTERIOGRAPHY 1. Ultrasound-guided puncture left radial artery 2. Celiac catheterization and arteriogram 3. Common hepatic catheterization and arteriogram 4. Gastroduodenal artery catheterization and arteriogram 5. Coil embolization gastroduodenal artery 6. Right gastric artery catheterization and arteriogram 7. Coil embolization right gastric artery 8. Left hepatic artery catheterization and arteriogram 9. Right hepatic artery catheterization an arteriogram 10. Instillation technetium 99 MAA 11. SMA catheterization an arteriogram MEDICATIONS: 3.375 g Zosyn. The antibiotic was administered within 1 hour of the procedure ANESTHESIA/SEDATION:  Moderate (conscious) sedation was employed during this procedure. A total of Versed 5 mg and Fentanyl 100 mcg was administered intravenously. Moderate Sedation Time: 80 minutes. The patient's level of consciousness and vital signs were monitored continuously by radiology nursing throughout the procedure under my direct supervision. CONTRAST:  882mISOVUE-300 IOPAMIDOL (ISOVUE-300) INJECTION 61% FLUOROSCOPY TIME:  Fluoroscopy Time: 17 minutes 6 seconds (2076 mGy). COMPLICATIONS: None immediate. PROCEDURE: Informed consent was obtained from the patient following explanation of the procedure, risks, benefits and alternatives. The patient understands, agrees and consents for the procedure. All questions were addressed. A time out was performed prior to the initiation of the procedure. Maximal barrier sterile technique utilized including caps, mask, sterile gowns, sterile gloves, large sterile drape, hand hygiene, and Betadine prep. Ultrasound was used to interrogate the wrist. The radial artery is widely patent. An image was saved for the electronic medical record. The skin surface was anesthetized with 1% lidocaine. Then, under  real-time ultrasound guidance the radial artery was punctured using a 21 gauge micropuncture needle. A 0.018 wire was advanced into the radial artery in the needle exchanged for a 5 French slender hydrophilic sheath. The sheath was flushed. A cocktail consisting of 3000 units heparin, 200 mcg nitroglycerin and 2.5 mg verapamil was then instilled into the artery. A 5 French angled catheter was advanced over a Bentson wire into the descending thoracic aorta. The Bentson wire was exchanged for a Glidewire. The catheter was used to select the celiac artery. A celiac arteriogram was performed. There is conventional hepatic arterial anatomy. Significant tumor blush is present throughout the right hepatic artery. The catheter was then advanced into the common hepatic artery. An arteriogram was  performed. The gastroduodenal artery is easily visible. There is a robust right gastric artery. A renegade ST microcatheter was then advanced coaxially through the 5 French catheter in used to select the gastroduodenal artery. An arteriogram was performed confirming the anatomy. Coil embolization was then performed using a series of [place] scientific interlock detachable microcoils. Successful coil embolization was confirmed by a post embolization arteriogram. The microcatheter was next advanced into the origin of the left hepatic artery. An arteriogram was performed. The robust right gastric artery arises from the proximal aspect of the left hepatic artery. The microcatheter was successfully navigated into this artery. Of note, 1 of the inferior phrenic arteries arises from the left gastric artery. Coil embolization was successfully performed again using a combination of interlock detachable coils. Successful occlusion was confirmed by post embolization arteriography. Finally, the microcatheter was advanced into the right hepatic artery and an arteriogram was performed. Normal right hepatic arterial anatomy. No aberrant vessels. 1 millicurie of technetium 99 MAA was injected into the right hepatic artery. The microcatheter was removed and discarded. The 5 French catheter was advanced into the superior mesenteric artery. A superior mesenteric arteriogram was performed. There is no evidence of accessory right hepatic artery. The main portal vein is patent. The catheter was removed. Hemostasis was attained with the use of a TR band. IMPRESSION: 1. Successful pre Y 90 mapping study. 2. Coil embolization of the right hepatic and gastroduodenal arteries. 3. Instillation of technetium 99 MAA into the right hepatic artery. Signed, Criselda Peaches, MD [facility] Rad[facility] Electronically Signed   By: Jacqulynn Cadet M.D.   On: 12/09/2016 08:25   Ir Angiogram Visceral  Selective  Result Date: 12/09/2016 INDICATION: 75 year old male with multifocal hepatocellular carcinoma. Pre Y 90 arterial mapping and lung shunt fraction calculation. EXAM: IR EMBO ARTERIAL NOT HEMORR HEMANG INC GUIDE ROADMAPPING; IR ULTRASOUND GUIDANCE VASC ACCESS RIGHT; ADDITIONAL ARTERIOGRAPHY; SELECTIVE VISCERAL ARTERIOGRAPHY 1. Ultrasound-guided puncture left radial artery 2. Celiac catheterization and arteriogram 3. Common hepatic catheterization and arteriogram 4. Gastroduodenal artery catheterization and arteriogram 5. Coil embolization gastroduodenal artery 6. Right gastric artery catheterization and arteriogram 7. Coil embolization right gastric artery 8. Left hepatic artery catheterization and arteriogram 9. Right hepatic artery catheterization an arteriogram 10. Instillation technetium 99 MAA 11. SMA catheterization an arteriogram MEDICATIONS: 3.375 g Zosyn. The antibiotic was administered within 1 hour of the procedure ANESTHESIA/SEDATION: Moderate (conscious) sedation was employed during this procedure. A total of Versed 5 mg and Fentanyl 100 mcg was administered intravenously. Moderate Sedation Time: 80 minutes. The patient's level of consciousness and vital signs were monitored continuously by radiology nursing throughout the procedure under my direct supervision. CONTRAST:  55m ISOVUE-300 IOPAMIDOL (ISOVUE-300) INJECTION 61% FLUOROSCOPY TIME:  Fluoroscopy Time: 17 minutes  6 seconds (2076 mGy). COMPLICATIONS: None immediate. PROCEDURE: Informed consent was obtained from the patient following explanation of the procedure, risks, benefits and alternatives. The patient understands, agrees and consents for the procedure. All questions were addressed. A time out was performed prior to the initiation of the procedure. Maximal barrier sterile technique utilized including caps, mask, sterile gowns, sterile gloves, large sterile drape, hand hygiene, and Betadine prep. Ultrasound was used to interrogate  the wrist. The radial artery is widely patent. An image was saved for the electronic medical record. The skin surface was anesthetized with 1% lidocaine. Then, under real-time ultrasound guidance the radial artery was punctured using a 21 gauge micropuncture needle. A 0.018 wire was advanced into the radial artery in the needle exchanged for a 5 French slender hydrophilic sheath. The sheath was flushed. A cocktail consisting of 3000 units heparin, 200 mcg nitroglycerin and 2.5 mg verapamil was then instilled into the artery. A 5 French angled catheter was advanced over a Bentson wire into the descending thoracic aorta. The Bentson wire was exchanged for a Glidewire. The catheter was used to select the celiac artery. A celiac arteriogram was performed. There is conventional hepatic arterial anatomy. Significant tumor blush is present throughout the right hepatic artery. The catheter was then advanced into the common hepatic artery. An arteriogram was performed. The gastroduodenal artery is easily visible. There is a robust right gastric artery. A renegade ST microcatheter was then advanced coaxially through the 5 French catheter in used to select the gastroduodenal artery. An arteriogram was performed confirming the anatomy. Coil embolization was then performed using a series of [employer] scientific interlock detachable microcoils. Successful coil embolization was confirmed by a post embolization arteriogram. The microcatheter was next advanced into the origin of the left hepatic artery. An arteriogram was performed. The robust right gastric artery arises from the proximal aspect of the left hepatic artery. The microcatheter was successfully navigated into this artery. Of note, 1 of the inferior phrenic arteries arises from the left gastric artery. Coil embolization was successfully performed again using a combination of interlock detachable coils. Successful occlusion was confirmed by post embolization arteriography.  Finally, the microcatheter was advanced into the right hepatic artery and an arteriogram was performed. Normal right hepatic arterial anatomy. No aberrant vessels. 1 millicurie of technetium 99 MAA was injected into the right hepatic artery. The microcatheter was removed and discarded. The 5 French catheter was advanced into the superior mesenteric artery. A superior mesenteric arteriogram was performed. There is no evidence of accessory right hepatic artery. The main portal vein is patent. The catheter was removed. Hemostasis was attained with the use of a TR band. IMPRESSION: 1. Successful pre Y 90 mapping study. 2. Coil embolization of the right hepatic and gastroduodenal arteries. 3. Instillation of technetium 99 MAA into the right hepatic artery. Signed, Criselda Peaches, MD [facility] Radiology Electronically Signed   By: Jacqulynn Cadet M.D.   On: 12/09/2016 08:25   Ir Angiogram Selective Each Additional Vessel  Result Date: 12/09/2016 INDICATION: 75 year old male with multifocal hepatocellular carcinoma. Pre Y 90 arterial mapping and lung shunt fraction calculation. EXAM: IR EMBO ARTERIAL NOT HEMORR HEMANG INC GUIDE ROADMAPPING; IR ULTRASOUND GUIDANCE VASC ACCESS RIGHT; ADDITIONAL ARTERIOGRAPHY; SELECTIVE VISCERAL ARTERIOGRAPHY 1. Ultrasound-guided puncture left radial artery 2. Celiac catheterization and arteriogram 3. Common hepatic catheterization and arteriogram 4. Gastroduodenal artery catheterization and arteriogram 5. Coil embolization gastroduodenal artery 6. Right gastric artery catheterization and arteriogram 7. Coil embolization right  gastric artery 8. Left hepatic artery catheterization and arteriogram 9. Right hepatic artery catheterization an arteriogram 10. Instillation technetium 99 MAA 11. SMA catheterization an arteriogram MEDICATIONS: 3.375 g Zosyn. The antibiotic was administered within 1 hour of the procedure  ANESTHESIA/SEDATION: Moderate (conscious) sedation was employed during this procedure. A total of Versed 5 mg and Fentanyl 100 mcg was administered intravenously. Moderate Sedation Time: 80 minutes. The patient's level of consciousness and vital signs were monitored continuously by radiology nursing throughout the procedure under my direct supervision. CONTRAST:  10m ISOVUE-300 IOPAMIDOL (ISOVUE-300) INJECTION 61% FLUOROSCOPY TIME:  Fluoroscopy Time: 17 minutes 6 seconds (2076 mGy). COMPLICATIONS: None immediate. PROCEDURE: Informed consent was obtained from the patient following explanation of the procedure, risks, benefits and alternatives. The patient understands, agrees and consents for the procedure. All questions were addressed. A time out was performed prior to the initiation of the procedure. Maximal barrier sterile technique utilized including caps, mask, sterile gowns, sterile gloves, large sterile drape, hand hygiene, and Betadine prep. Ultrasound was used to interrogate the wrist. The radial artery is widely patent. An image was saved for the electronic medical record. The skin surface was anesthetized with 1% lidocaine. Then, under real-time ultrasound guidance the radial artery was punctured using a 21 gauge micropuncture needle. A 0.018 wire was advanced into the radial artery in the needle exchanged for a 5 French slender hydrophilic sheath. The sheath was flushed. A cocktail consisting of 3000 units heparin, 200 mcg nitroglycerin and 2.5 mg verapamil was then instilled into the artery. A 5 French angled catheter was advanced over a Bentson wire into the descending thoracic aorta. The Bentson wire was exchanged for a Glidewire. The catheter was used to select the celiac artery. A celiac arteriogram was performed. There is conventional hepatic arterial anatomy. Significant tumor blush is present throughout the right hepatic artery. The catheter was then advanced into the common hepatic artery. An  arteriogram was performed. The gastroduodenal artery is easily visible. There is a robust right gastric artery. A renegade ST microcatheter was then advanced coaxially through the 5 French catheter in used to select the gastroduodenal artery. An arteriogram was performed confirming the anatomy. Coil embolization was then performed using a series of [place] scientific interlock detachable microcoils. Successful coil embolization was confirmed by a post embolization arteriogram. The microcatheter was next advanced into the origin of the left hepatic artery. An arteriogram was performed. The robust right gastric artery arises from the proximal aspect of the left hepatic artery. The microcatheter was successfully navigated into this artery. Of note, 1 of the inferior phrenic arteries arises from the left gastric artery. Coil embolization was successfully performed again using a combination of interlock detachable coils. Successful occlusion was confirmed by post embolization arteriography. Finally, the microcatheter was advanced into the right hepatic artery and an arteriogram was performed. Normal right hepatic arterial anatomy. No aberrant vessels. 1 millicurie of technetium 99 MAA was injected into the right hepatic artery. The microcatheter was removed and discarded. The 5 French catheter was advanced into the superior mesenteric artery. A superior mesenteric arteriogram was performed. There is no evidence of accessory right hepatic artery. The main portal vein is patent. The catheter was removed. Hemostasis was attained with the use of a TR band. IMPRESSION: 1. Successful pre Y 90 mapping study. 2. Coil embolization of the right hepatic and gastroduodenal arteries. 3. Instillation of technetium 99 MAA into the right hepatic artery. Signed, HCriselda Peaches MD [facility]  Electronically Signed   By: Jacqulynn Cadet M.D.   On: 12/09/2016 08:25   Ir  Angiogram Selective Each Additional Vessel  Result Date: 12/09/2016 INDICATION: 75 year old male with multifocal hepatocellular carcinoma. Pre Y 90 arterial mapping and lung shunt fraction calculation. EXAM: IR EMBO ARTERIAL NOT HEMORR HEMANG INC GUIDE ROADMAPPING; IR ULTRASOUND GUIDANCE VASC ACCESS RIGHT; ADDITIONAL ARTERIOGRAPHY; SELECTIVE VISCERAL ARTERIOGRAPHY 1. Ultrasound-guided puncture left radial artery 2. Celiac catheterization and arteriogram 3. Common hepatic catheterization and arteriogram 4. Gastroduodenal artery catheterization and arteriogram 5. Coil embolization gastroduodenal artery 6. Right gastric artery catheterization and arteriogram 7. Coil embolization right gastric artery 8. Left hepatic artery catheterization and arteriogram 9. Right hepatic artery catheterization an arteriogram 10. Instillation technetium 99 MAA 11. SMA catheterization an arteriogram MEDICATIONS: 3.375 g Zosyn. The antibiotic was administered within 1 hour of the procedure ANESTHESIA/SEDATION: Moderate (conscious) sedation was employed during this procedure. A total of Versed 5 mg and Fentanyl 100 mcg was administered intravenously. Moderate Sedation Time: 80 minutes. The patient's level of consciousness and vital signs were monitored continuously by radiology nursing throughout the procedure under my direct supervision. CONTRAST:  42m ISOVUE-300 IOPAMIDOL (ISOVUE-300) INJECTION 61% FLUOROSCOPY TIME:  Fluoroscopy Time: 17 minutes 6 seconds (2076 mGy). COMPLICATIONS: None immediate. PROCEDURE: Informed consent was obtained from the patient following explanation of the procedure, risks, benefits and alternatives. The patient understands, agrees and consents for the procedure. All questions were addressed. A time out was performed prior to the initiation of the procedure. Maximal barrier sterile technique utilized including caps, mask, sterile gowns, sterile gloves, large sterile drape, hand hygiene, and Betadine prep.  Ultrasound was used to interrogate the wrist. The radial artery is widely patent. An image was saved for the electronic medical record. The skin surface was anesthetized with 1% lidocaine. Then, under real-time ultrasound guidance the radial artery was punctured using a 21 gauge micropuncture needle. A 0.018 wire was advanced into the radial artery in the needle exchanged for a 5 French slender hydrophilic sheath. The sheath was flushed. A cocktail consisting of 3000 units heparin, 200 mcg nitroglycerin and 2.5 mg verapamil was then instilled into the artery. A 5 French angled catheter was advanced over a Bentson wire into the descending thoracic aorta. The Bentson wire was exchanged for a Glidewire. The catheter was used to select the celiac artery. A celiac arteriogram was performed. There is conventional hepatic arterial anatomy. Significant tumor blush is present throughout the right hepatic artery. The catheter was then advanced into the common hepatic artery. An arteriogram was performed. The gastroduodenal artery is easily visible. There is a robust right gastric artery. A renegade ST microcatheter was then advanced coaxially through the 5 French catheter in used to select the gastroduodenal artery. An arteriogram was performed confirming the anatomy. Coil embolization was then performed using a series of [place] scientific interlock detachable microcoils. Successful coil embolization was confirmed by a post embolization arteriogram. The microcatheter was next advanced into the origin of the left hepatic artery. An arteriogram was performed. The robust right gastric artery arises from the proximal aspect of the left hepatic artery. The microcatheter was successfully navigated into this artery. Of note, 1 of the inferior phrenic arteries arises from the left gastric artery. Coil embolization was successfully performed again using a combination of interlock detachable coils. Successful occlusion was confirmed by  post embolization arteriography. Finally, the microcatheter was advanced into the right hepatic artery and an arteriogram was performed. Normal right hepatic arterial anatomy. No aberrant vessels. 1 millicurie  of technetium 99 MAA was injected into the right hepatic artery. The microcatheter was removed and discarded. The 5 French catheter was advanced into the superior mesenteric artery. A superior mesenteric arteriogram was performed. There is no evidence of accessory right hepatic artery. The main portal vein is patent. The catheter was removed. Hemostasis was attained with the use of a TR band. IMPRESSION: 1. Successful pre Y 90 mapping study. 2. Coil embolization of the right hepatic and gastroduodenal arteries. 3. Instillation of technetium 99 MAA into the right hepatic artery. Signed, Criselda Peaches, MD [facility] Radiology Electronically Signed   By: Jacqulynn Cadet M.D.   On: 12/09/2016 08:25   Ir Angiogram Selective Each Additional Vessel  Result Date: 12/09/2016 INDICATION: 75 year old male with multifocal hepatocellular carcinoma. Pre Y 90 arterial mapping and lung shunt fraction calculation. EXAM: IR EMBO ARTERIAL NOT HEMORR HEMANG INC GUIDE ROADMAPPING; IR ULTRASOUND GUIDANCE VASC ACCESS RIGHT; ADDITIONAL ARTERIOGRAPHY; SELECTIVE VISCERAL ARTERIOGRAPHY 1. Ultrasound-guided puncture left radial artery 2. Celiac catheterization and arteriogram 3. Common hepatic catheterization and arteriogram 4. Gastroduodenal artery catheterization and arteriogram 5. Coil embolization gastroduodenal artery 6. Right gastric artery catheterization and arteriogram 7. Coil embolization right gastric artery 8. Left hepatic artery catheterization and arteriogram 9. Right hepatic artery catheterization an arteriogram 10. Instillation technetium 99 MAA 11. SMA catheterization an arteriogram MEDICATIONS: 3.375 g Zosyn. The antibiotic was administered within 1 hour of  the procedure ANESTHESIA/SEDATION: Moderate (conscious) sedation was employed during this procedure. A total of Versed 5 mg and Fentanyl 100 mcg was administered intravenously. Moderate Sedation Time: 80 minutes. The patient's level of consciousness and vital signs were monitored continuously by radiology nursing throughout the procedure under my direct supervision. CONTRAST:  35m ISOVUE-300 IOPAMIDOL (ISOVUE-300) INJECTION 61% FLUOROSCOPY TIME:  Fluoroscopy Time: 17 minutes 6 seconds (2076 mGy). COMPLICATIONS: None immediate. PROCEDURE: Informed consent was obtained from the patient following explanation of the procedure, risks, benefits and alternatives. The patient understands, agrees and consents for the procedure. All questions were addressed. A time out was performed prior to the initiation of the procedure. Maximal barrier sterile technique utilized including caps, mask, sterile gowns, sterile gloves, large sterile drape, hand hygiene, and Betadine prep. Ultrasound was used to interrogate the wrist. The radial artery is widely patent. An image was saved for the electronic medical record. The skin surface was anesthetized with 1% lidocaine. Then, under real-time ultrasound guidance the radial artery was punctured using a 21 gauge micropuncture needle. A 0.018 wire was advanced into the radial artery in the needle exchanged for a 5 French slender hydrophilic sheath. The sheath was flushed. A cocktail consisting of 3000 units heparin, 200 mcg nitroglycerin and 2.5 mg verapamil was then instilled into the artery. A 5 French angled catheter was advanced over a Bentson wire into the descending thoracic aorta. The Bentson wire was exchanged for a Glidewire. The catheter was used to select the celiac artery. A celiac arteriogram was performed. There is conventional hepatic arterial anatomy. Significant tumor blush is present throughout the right hepatic artery. The catheter was then advanced into the common hepatic  artery. An arteriogram was performed. The gastroduodenal artery is easily visible. There is a robust right gastric artery. A renegade ST microcatheter was then advanced coaxially through the 5 French catheter in used to select the gastroduodenal artery. An arteriogram was performed confirming the anatomy. Coil embolization was then performed using a series of [place] scientific interlock detachable microcoils. Successful coil embolization was confirmed by a  post embolization arteriogram. The microcatheter was next advanced into the origin of the left hepatic artery. An arteriogram was performed. The robust right gastric artery arises from the proximal aspect of the left hepatic artery. The microcatheter was successfully navigated into this artery. Of note, 1 of the inferior phrenic arteries arises from the left gastric artery. Coil embolization was successfully performed again using a combination of interlock detachable coils. Successful occlusion was confirmed by post embolization arteriography. Finally, the microcatheter was advanced into the right hepatic artery and an arteriogram was performed. Normal right hepatic arterial anatomy. No aberrant vessels. 1 millicurie of technetium 99 MAA was injected into the right hepatic artery. The microcatheter was removed and discarded. The 5 French catheter was advanced into the superior mesenteric artery. A superior mesenteric arteriogram was performed. There is no evidence of accessory right hepatic artery. The main portal vein is patent. The catheter was removed. Hemostasis was attained with the use of a TR band. IMPRESSION: 1. Successful pre Y 90 mapping study. 2. Coil embolization of the right hepatic and gastroduodenal arteries. 3. Instillation of technetium 99 MAA into the right hepatic artery. Signed, Criselda Peaches, MD [facility] Radiology Electronically Signed   By: Jacqulynn Cadet M.D.   On: 12/09/2016 08:25    Ir Angiogram Selective Each Additional Vessel  Result Date: 12/09/2016 INDICATION: 75 year old male with multifocal hepatocellular carcinoma. Pre Y 90 arterial mapping and lung shunt fraction calculation. EXAM: IR EMBO ARTERIAL NOT HEMORR HEMANG INC GUIDE ROADMAPPING; IR ULTRASOUND GUIDANCE VASC ACCESS RIGHT; ADDITIONAL ARTERIOGRAPHY; SELECTIVE VISCERAL ARTERIOGRAPHY 1. Ultrasound-guided puncture left radial artery 2. Celiac catheterization and arteriogram 3. Common hepatic catheterization and arteriogram 4. Gastroduodenal artery catheterization and arteriogram 5. Coil embolization gastroduodenal artery 6. Right gastric artery catheterization and arteriogram 7. Coil embolization right gastric artery 8. Left hepatic artery catheterization and arteriogram 9. Right hepatic artery catheterization an arteriogram 10. Instillation technetium 99 MAA 11. SMA catheterization an arteriogram MEDICATIONS: 3.375 g Zosyn. The antibiotic was administered within 1 hour of the procedure ANESTHESIA/SEDATION: Moderate (conscious) sedation was employed during this procedure. A total of Versed 5 mg and Fentanyl 100 mcg was administered intravenously. Moderate Sedation Time: 80 minutes. The patient's level of consciousness and vital signs were monitored continuously by radiology nursing throughout the procedure under my direct supervision. CONTRAST:  84m ISOVUE-300 IOPAMIDOL (ISOVUE-300) INJECTION 61% FLUOROSCOPY TIME:  Fluoroscopy Time: 17 minutes 6 seconds (2076 mGy). COMPLICATIONS: None immediate. PROCEDURE: Informed consent was obtained from the patient following explanation of the procedure, risks, benefits and alternatives. The patient understands, agrees and consents for the procedure. All questions were addressed. A time out was performed prior to the initiation of the procedure. Maximal barrier sterile technique utilized including caps, mask, sterile gowns, sterile gloves, large sterile drape, hand hygiene, and Betadine  prep. Ultrasound was used to interrogate the wrist. The radial artery is widely patent. An image was saved for the electronic medical record. The skin surface was anesthetized with 1% lidocaine. Then, under real-time ultrasound guidance the radial artery was punctured using a 21 gauge micropuncture needle. A 0.018 wire was advanced into the radial artery in the needle exchanged for a 5 French slender hydrophilic sheath. The sheath was flushed. A cocktail consisting of 3000 units heparin, 200 mcg nitroglycerin and 2.5 mg verapamil was then instilled into the artery. A 5 French angled catheter was advanced over a Bentson wire into the descending thoracic aorta. The Bentson wire was exchanged for a Glidewire. The catheter  was used to select the celiac artery. A celiac arteriogram was performed. There is conventional hepatic arterial anatomy. Significant tumor blush is present throughout the right hepatic artery. The catheter was then advanced into the common hepatic artery. An arteriogram was performed. The gastroduodenal artery is easily visible. There is a robust right gastric artery. A renegade ST microcatheter was then advanced coaxially through the 5 French catheter in used to select the gastroduodenal artery. An arteriogram was performed confirming the anatomy. Coil embolization was then performed using a series of [place] scientific interlock detachable microcoils. Successful coil embolization was confirmed by a post embolization arteriogram. The microcatheter was next advanced into the origin of the left hepatic artery. An arteriogram was performed. The robust right gastric artery arises from the proximal aspect of the left hepatic artery. The microcatheter was successfully navigated into this artery. Of note, 1 of the inferior phrenic arteries arises from the left gastric artery. Coil embolization was successfully performed again using a combination of interlock detachable coils. Successful occlusion was  confirmed by post embolization arteriography. Finally, the microcatheter was advanced into the right hepatic artery and an arteriogram was performed. Normal right hepatic arterial anatomy. No aberrant vessels. 1 millicurie of technetium 99 MAA was injected into the right hepatic artery. The microcatheter was removed and discarded. The 5 French catheter was advanced into the superior mesenteric artery. A superior mesenteric arteriogram was performed. There is no evidence of accessory right hepatic artery. The main portal vein is patent. The catheter was removed. Hemostasis was attained with the use of a TR band. IMPRESSION: 1. Successful pre Y 90 mapping study. 2. Coil embolization of the right hepatic and gastroduodenal arteries. 3. Instillation of technetium 99 MAA into the right hepatic artery. Signed, Criselda Peaches, MD [facility] Radiology Electronically Signed   By: Jacqulynn Cadet M.D.   On: 12/09/2016 08:25   Ir Angiogram Selective Each Additional Vessel  Result Date: 12/09/2016 INDICATION: 75 year old male with multifocal hepatocellular carcinoma. Pre Y 90 arterial mapping and lung shunt fraction calculation. EXAM: IR EMBO ARTERIAL NOT HEMORR HEMANG INC GUIDE ROADMAPPING; IR ULTRASOUND GUIDANCE VASC ACCESS RIGHT; ADDITIONAL ARTERIOGRAPHY; SELECTIVE VISCERAL ARTERIOGRAPHY 1. Ultrasound-guided puncture left radial artery 2. Celiac catheterization and arteriogram 3. Common hepatic catheterization and arteriogram 4. Gastroduodenal artery catheterization and arteriogram 5. Coil embolization gastroduodenal artery 6. Right gastric artery catheterization and arteriogram 7. Coil embolization right gastric artery 8. Left hepatic artery catheterization and arteriogram 9. Right hepatic artery catheterization an arteriogram 10. Instillation technetium 99 MAA 11. SMA catheterization an arteriogram MEDICATIONS: 3.375 g Zosyn. The antibiotic was administered  within 1 hour of the procedure ANESTHESIA/SEDATION: Moderate (conscious) sedation was employed during this procedure. A total of Versed 5 mg and Fentanyl 100 mcg was administered intravenously. Moderate Sedation Time: 80 minutes. The patient's level of consciousness and vital signs were monitored continuously by radiology nursing throughout the procedure under my direct supervision. CONTRAST:  54m ISOVUE-300 IOPAMIDOL (ISOVUE-300) INJECTION 61% FLUOROSCOPY TIME:  Fluoroscopy Time: 17 minutes 6 seconds (2076 mGy). COMPLICATIONS: None immediate. PROCEDURE: Informed consent was obtained from the patient following explanation of the procedure, risks, benefits and alternatives. The patient understands, agrees and consents for the procedure. All questions were addressed. A time out was performed prior to the initiation of the procedure. Maximal barrier sterile technique utilized including caps, mask, sterile gowns, sterile gloves, large sterile drape, hand hygiene, and Betadine prep. Ultrasound was used to interrogate the wrist. The radial artery is widely patent.  An image was saved for the electronic medical record. The skin surface was anesthetized with 1% lidocaine. Then, under real-time ultrasound guidance the radial artery was punctured using a 21 gauge micropuncture needle. A 0.018 wire was advanced into the radial artery in the needle exchanged for a 5 French slender hydrophilic sheath. The sheath was flushed. A cocktail consisting of 3000 units heparin, 200 mcg nitroglycerin and 2.5 mg verapamil was then instilled into the artery. A 5 French angled catheter was advanced over a Bentson wire into the descending thoracic aorta. The Bentson wire was exchanged for a Glidewire. The catheter was used to select the celiac artery. A celiac arteriogram was performed. There is conventional hepatic arterial anatomy. Significant tumor blush is present throughout the right hepatic artery. The catheter was then advanced into  the common hepatic artery. An arteriogram was performed. The gastroduodenal artery is easily visible. There is a robust right gastric artery. A renegade ST microcatheter was then advanced coaxially through the 5 French catheter in used to select the gastroduodenal artery. An arteriogram was performed confirming the anatomy. Coil embolization was then performed using a series of [place] scientific interlock detachable microcoils. Successful coil embolization was confirmed by a post embolization arteriogram. The microcatheter was next advanced into the origin of the left hepatic artery. An arteriogram was performed. The robust right gastric artery arises from the proximal aspect of the left hepatic artery. The microcatheter was successfully navigated into this artery. Of note, 1 of the inferior phrenic arteries arises from the left gastric artery. Coil embolization was successfully performed again using a combination of interlock detachable coils. Successful occlusion was confirmed by post embolization arteriography. Finally, the microcatheter was advanced into the right hepatic artery and an arteriogram was performed. Normal right hepatic arterial anatomy. No aberrant vessels. 1 millicurie of technetium 99 MAA was injected into the right hepatic artery. The microcatheter was removed and discarded. The 5 French catheter was advanced into the superior mesenteric artery. A superior mesenteric arteriogram was performed. There is no evidence of accessory right hepatic artery. The main portal vein is patent. The catheter was removed. Hemostasis was attained with the use of a TR band. IMPRESSION: 1. Successful pre Y 90 mapping study. 2. Coil embolization of the right hepatic and gastroduodenal arteries. 3. Instillation of technetium 99 MAA into the right hepatic artery. Signed, Criselda Peaches, MD [facility] Radiology Electronically Signed   By: Jacqulynn Cadet M.D.    On: 12/09/2016 08:25   Ir US Guide Vasc Access Right  Result Date: 12/09/2016 INDICATION: 75 year old male with multifocal hepatocellular carcinoma. Pre Y 90 arterial mapping and lung shunt fraction calculation. EXAM: IR EMBO ARTERIAL NOT HEMORR HEMANG INC GUIDE ROADMAPPING; IR ULTRASOUND GUIDANCE VASC ACCESS RIGHT; ADDITIONAL ARTERIOGRAPHY; SELECTIVE VISCERAL ARTERIOGRAPHY 1. Ultrasound-guided puncture left radial artery 2. Celiac catheterization and arteriogram 3. Common hepatic catheterization and arteriogram 4. Gastroduodenal artery catheterization and arteriogram 5. Coil embolization gastroduodenal artery 6. Right gastric artery catheterization and arteriogram 7. Coil embolization right gastric artery 8. Left hepatic artery catheterization and arteriogram 9. Right hepatic artery catheterization an arteriogram 10. Instillation technetium 99 MAA 11. SMA catheterization an arteriogram MEDICATIONS: 3.375 g Zosyn. The antibiotic was administered within 1 hour of the procedure ANESTHESIA/SEDATION: Moderate (conscious) sedation was employed during this procedure. A total of Versed 5 mg and Fentanyl 100 mcg was administered intravenously. Moderate Sedation Time: 80 minutes. The patient's level of consciousness and vital signs were monitored continuously by radiology nursing throughout  the procedure under my direct supervision. CONTRAST:  53m ISOVUE-300 IOPAMIDOL (ISOVUE-300) INJECTION 61% FLUOROSCOPY TIME:  Fluoroscopy Time: 17 minutes 6 seconds (2076 mGy). COMPLICATIONS: None immediate. PROCEDURE: Informed consent was obtained from the patient following explanation of the procedure, risks, benefits and alternatives. The patient understands, agrees and consents for the procedure. All questions were addressed. A time out was performed prior to the initiation of the procedure. Maximal barrier sterile technique utilized including caps, mask, sterile gowns, sterile gloves, large sterile drape, hand hygiene, and  Betadine prep. Ultrasound was used to interrogate the wrist. The radial artery is widely patent. An image was saved for the electronic medical record. The skin surface was anesthetized with 1% lidocaine. Then, under real-time ultrasound guidance the radial artery was punctured using a 21 gauge micropuncture needle. A 0.018 wire was advanced into the radial artery in the needle exchanged for a 5 French slender hydrophilic sheath. The sheath was flushed. A cocktail consisting of 3000 units heparin, 200 mcg nitroglycerin and 2.5 mg verapamil was then instilled into the artery. A 5 French angled catheter was advanced over a Bentson wire into the descending thoracic aorta. The Bentson wire was exchanged for a Glidewire. The catheter was used to select the celiac artery. A celiac arteriogram was performed. There is conventional hepatic arterial anatomy. Significant tumor blush is present throughout the right hepatic artery. The catheter was then advanced into the common hepatic artery. An arteriogram was performed. The gastroduodenal artery is easily visible. There is a robust right gastric artery. A renegade ST microcatheter was then advanced coaxially through the 5 French catheter in used to select the gastroduodenal artery. An arteriogram was performed confirming the anatomy. Coil embolization was then performed using a series of [place] scientific interlock detachable microcoils. Successful coil embolization was confirmed by a post embolization arteriogram. The microcatheter was next advanced into the origin of the left hepatic artery. An arteriogram was performed. The robust right gastric artery arises from the proximal aspect of the left hepatic artery. The microcatheter was successfully navigated into this artery. Of note, 1 of the inferior phrenic arteries arises from the left gastric artery. Coil embolization was successfully performed again using a combination of interlock detachable coils. Successful occlusion  was confirmed by post embolization arteriography. Finally, the microcatheter was advanced into the right hepatic artery and an arteriogram was performed. Normal right hepatic arterial anatomy. No aberrant vessels. 1 millicurie of technetium 99 MAA was injected into the right hepatic artery. The microcatheter was removed and discarded. The 5 French catheter was advanced into the superior mesenteric artery. A superior mesenteric arteriogram was performed. There is no evidence of accessory right hepatic artery. The main portal vein is patent. The catheter was removed. Hemostasis was attained with the use of a TR band. IMPRESSION: 1. Successful pre Y 90 mapping study. 2. Coil embolization of the right hepatic and gastroduodenal arteries. 3. Instillation of technetium 99 MAA into the right hepatic artery. Signed, HCriselda Peaches MD [facility] Electronically Signed   By: HJacqulynn CadetM.D.   On: 12/09/2016 08:25   Ir Embo Arterial Not HWasolaGuide Roadmapping  Result Date: 12/09/2016 INDICATION: 75year old male with multifocal hepatocellular carcinoma. Pre Y 90 arterial mapping and lung shunt fraction calculation. EXAM: IR EMBO ARTERIAL NOT HEMORR HEMANG INC GUIDE ROADMAPPING; IR ULTRASOUND GUIDANCE VASC ACCESS RIGHT; ADDITIONAL ARTERIOGRAPHY; SELECTIVE VISCERAL ARTERIOGRAPHY 1. Ultrasound-guided puncture left radial artery 2. Celiac catheterization and arteriogram 3. Common hepatic catheterization  and arteriogram 4. Gastroduodenal artery catheterization and arteriogram 5. Coil embolization gastroduodenal artery 6. Right gastric artery catheterization and arteriogram 7. Coil embolization right gastric artery 8. Left hepatic artery catheterization and arteriogram 9. Right hepatic artery catheterization an arteriogram 10. Instillation technetium 99 MAA 11. SMA catheterization an arteriogram MEDICATIONS: 3.375 g Zosyn. The antibiotic was  administered within 1 hour of the procedure ANESTHESIA/SEDATION: Moderate (conscious) sedation was employed during this procedure. A total of Versed 5 mg and Fentanyl 100 mcg was administered intravenously. Moderate Sedation Time: 80 minutes. The patient's level of consciousness and vital signs were monitored continuously by radiology nursing throughout the procedure under my direct supervision. CONTRAST:  37m ISOVUE-300 IOPAMIDOL (ISOVUE-300) INJECTION 61% FLUOROSCOPY TIME:  Fluoroscopy Time: 17 minutes 6 seconds (2076 mGy). COMPLICATIONS: None immediate. PROCEDURE: Informed consent was obtained from the patient following explanation of the procedure, risks, benefits and alternatives. The patient understands, agrees and consents for the procedure. All questions were addressed. A time out was performed prior to the initiation of the procedure. Maximal barrier sterile technique utilized including caps, mask, sterile gowns, sterile gloves, large sterile drape, hand hygiene, and Betadine prep. Ultrasound was used to interrogate the wrist. The radial artery is widely patent. An image was saved for the electronic medical record. The skin surface was anesthetized with 1% lidocaine. Then, under real-time ultrasound guidance the radial artery was punctured using a 21 gauge micropuncture needle. A 0.018 wire was advanced into the radial artery in the needle exchanged for a 5 French slender hydrophilic sheath. The sheath was flushed. A cocktail consisting of 3000 units heparin, 200 mcg nitroglycerin and 2.5 mg verapamil was then instilled into the artery. A 5 French angled catheter was advanced over a Bentson wire into the descending thoracic aorta. The Bentson wire was exchanged for a Glidewire. The catheter was used to select the celiac artery. A celiac arteriogram was performed. There is conventional hepatic arterial anatomy. Significant tumor blush is present throughout the right hepatic artery. The catheter was then  advanced into the common hepatic artery. An arteriogram was performed. The gastroduodenal artery is easily visible. There is a robust right gastric artery. A renegade ST microcatheter was then advanced coaxially through the 5 French catheter in used to select the gastroduodenal artery. An arteriogram was performed confirming the anatomy. Coil embolization was then performed using a series of [place] scientific interlock detachable microcoils. Successful coil embolization was confirmed by a post embolization arteriogram. The microcatheter was next advanced into the origin of the left hepatic artery. An arteriogram was performed. The robust right gastric artery arises from the proximal aspect of the left hepatic artery. The microcatheter was successfully navigated into this artery. Of note, 1 of the inferior phrenic arteries arises from the left gastric artery. Coil embolization was successfully performed again using a combination of interlock detachable coils. Successful occlusion was confirmed by post embolization arteriography. Finally, the microcatheter was advanced into the right hepatic artery and an arteriogram was performed. Normal right hepatic arterial anatomy. No aberrant vessels. 1 millicurie of technetium 99 MAA was injected into the right hepatic artery. The microcatheter was removed and discarded. The 5 French catheter was advanced into the superior mesenteric artery. A superior mesenteric arteriogram was performed. There is no evidence of accessory right hepatic artery. The main portal vein is patent. The catheter was removed. Hemostasis was attained with the use of a TR band. IMPRESSION: 1. Successful pre Y 90 mapping study. 2. Coil embolization of the right hepatic and gastroduodenal  arteries. 3. Instillation of technetium 99 MAA into the right hepatic artery. Signed, Criselda Peaches, MD [facility] Electronically Signed   By: Jacqulynn Cadet M.D.   On: 12/09/2016 08:25   Nm Fusion  Result Date: 11/29/2016 CLINICAL DATA:  Hepatocellular carcinoma. EXAM: NUCLEAR MEDICINE LIVER SCAN TECHNIQUE: Abdominal images were obtained in multiple projections after intrahepatic arterial injection of radiopharmaceutical. SPECT imaging was performed. Lung shunt calculation was performed. RADIOPHARMACEUTICALS:  5.72mllicurie MAA TECHNETIUM TO 6M ALBUMIN AGGREGATED COMPARISON:  CT from 10/29/2016. FINDINGS: The injected microaggregated albumin localizes within the liver. No evidence of activity within the stomach, duodenum, or bowel. Calculated shunt fraction to the lungs equals 11.2%. IMPRESSION: 1. No significant extrahepatic radiotracer activity following intrahepatic arterial injection of MAA. 2. Lung shunt fraction equals 11.2% Electronically Signed   By: TKerby MoorsM.D.   On: 11/29/2016 16:10    ASSESSMENT AND PLAN: This is a very pleasant 75years old white male with multiple medical problems including: 1) metastatic non-small cell lung cancer, adenocarcinoma, status post several chemotherapy regimens and currently on treatment with Ketruda (pembrolizumab) 200 mg IV every 3 weeks is status post 5 cycles. 2) unresectable hepatocellular carcinoma: The patient is not a good candidate for the Y90 liver treatment. We will continue his current treatment with KHungary(pembrolizumab) today. I will arrange for the patient to have repeat CT scan of the chest, abdomen and pelvis after this cycle for restaging of his disease. If he has any further evidence for disease progression, I may consider the patient for treatment with sorafenib. 3) dehydration: I will arrange for the patient to receive 500 mL of normal saline in the clinic today. 4) atrial fibrillation: He will continue his current treatment with Eliquis 5) lower extremity edema: He will continue his treatment with Demadex The patient was advised to call immediately if he has any  concerning symptoms in the interval. The patient voices understanding of current disease status and treatment options and is in agreement with the current care plan.  All questions were answered. The patient knows to call the clinic with any problems, questions or concerns. We can certainly see the patient much sooner if necessary.  Disclaimer: This note was dictated with voice recognition software. Similar sounding words can inadvertently be transcribed and may not be corrected upon review.

## 2016-12-18 NOTE — Telephone Encounter (Signed)
Appointments scheduled per 12/27 LOS. Patient had to leave to go to infusion appointment. Patient will pick up report and calendars in infusion room.

## 2016-12-18 NOTE — Telephone Encounter (Signed)
Per LOS I have scheduled appts and notified the scheduler 

## 2016-12-18 NOTE — Progress Notes (Signed)
Reviewed labs with Dr. [name] ( sodium 134, Glucose 259, Creatine 1.5, AST 91). Okay to proceed with treatment per Dr. [name].

## 2016-12-19 ENCOUNTER — Other Ambulatory Visit: Payer: Medicare Other

## 2016-12-19 ENCOUNTER — Ambulatory Visit: Payer: Medicare Other

## 2016-12-24 ENCOUNTER — Ambulatory Visit: Payer: Medicare Other | Admitting: Internal Medicine

## 2016-12-25 ENCOUNTER — Telehealth: Payer: Self-pay | Admitting: *Deleted

## 2016-12-25 NOTE — Telephone Encounter (Signed)
Wife called stating that patient received chemotherapy Beryle Flock) 12/18/16. Patient noticed bumps on side of stomach, back, and buttocks. Patient also noticed rashes on the stomach area and chest. Patient complains of itching and has used cortizone 10 since yesterday. Please advise.

## 2016-12-25 NOTE — Telephone Encounter (Signed)
Rash is now all over stomach and back , neck , broke skin on legs scratching it. Pt is very uncomfortable.

## 2016-12-26 ENCOUNTER — Other Ambulatory Visit: Payer: Self-pay | Admitting: *Deleted

## 2016-12-26 ENCOUNTER — Ambulatory Visit (HOSPITAL_BASED_OUTPATIENT_CLINIC_OR_DEPARTMENT_OTHER): Payer: Medicare Other | Admitting: Nurse Practitioner

## 2016-12-26 ENCOUNTER — Ambulatory Visit (HOSPITAL_BASED_OUTPATIENT_CLINIC_OR_DEPARTMENT_OTHER): Payer: Medicare Other

## 2016-12-26 ENCOUNTER — Other Ambulatory Visit: Payer: Self-pay | Admitting: Nurse Practitioner

## 2016-12-26 ENCOUNTER — Ambulatory Visit: Payer: Medicare Other | Admitting: Internal Medicine

## 2016-12-26 ENCOUNTER — Ambulatory Visit: Payer: Medicare Other | Admitting: *Deleted

## 2016-12-26 VITALS — BP 129/72 | HR 79 | Temp 98.7°F | Resp 18 | Ht 69.0 in | Wt 169.3 lb

## 2016-12-26 DIAGNOSIS — N289 Disorder of kidney and ureter, unspecified: Secondary | ICD-10-CM

## 2016-12-26 DIAGNOSIS — E860 Dehydration: Secondary | ICD-10-CM | POA: Diagnosis not present

## 2016-12-26 DIAGNOSIS — R21 Rash and other nonspecific skin eruption: Secondary | ICD-10-CM

## 2016-12-26 DIAGNOSIS — C3492 Malignant neoplasm of unspecified part of left bronchus or lung: Secondary | ICD-10-CM

## 2016-12-26 DIAGNOSIS — C220 Liver cell carcinoma: Secondary | ICD-10-CM | POA: Diagnosis not present

## 2016-12-26 DIAGNOSIS — N189 Chronic kidney disease, unspecified: Secondary | ICD-10-CM | POA: Diagnosis not present

## 2016-12-26 DIAGNOSIS — E039 Hypothyroidism, unspecified: Secondary | ICD-10-CM

## 2016-12-26 DIAGNOSIS — E1149 Type 2 diabetes mellitus with other diabetic neurological complication: Secondary | ICD-10-CM

## 2016-12-26 LAB — CBC WITH DIFFERENTIAL/PLATELET
BASO%: 0.5 % (ref 0.0–2.0)
Basophils Absolute: 0 10*3/uL (ref 0.0–0.1)
EOS%: 1.9 % (ref 0.0–7.0)
Eosinophils Absolute: 0.1 10*3/uL (ref 0.0–0.5)
HEMATOCRIT: 50.4 % — AB (ref 38.4–49.9)
HGB: 16 g/dL (ref 13.0–17.1)
LYMPH#: 0.5 10*3/uL — AB (ref 0.9–3.3)
LYMPH%: 9.2 % — ABNORMAL LOW (ref 14.0–49.0)
MCH: 26.8 pg — ABNORMAL LOW (ref 27.2–33.4)
MCHC: 31.7 g/dL — ABNORMAL LOW (ref 32.0–36.0)
MCV: 84.6 fL (ref 79.3–98.0)
MONO#: 0.6 10*3/uL (ref 0.1–0.9)
MONO%: 10.2 % (ref 0.0–14.0)
NEUT%: 78.2 % — ABNORMAL HIGH (ref 39.0–75.0)
NEUTROS ABS: 4.5 10*3/uL (ref 1.5–6.5)
PLATELETS: 247 10*3/uL (ref 140–400)
RBC: 5.96 10*6/uL — ABNORMAL HIGH (ref 4.20–5.82)
RDW: 19.4 % — AB (ref 11.0–14.6)
WBC: 5.8 10*3/uL (ref 4.0–10.3)

## 2016-12-26 LAB — COMPREHENSIVE METABOLIC PANEL
ALT: 63 U/L — AB (ref 0–55)
ANION GAP: 12 meq/L — AB (ref 3–11)
AST: 105 U/L — ABNORMAL HIGH (ref 5–34)
Albumin: 3.2 g/dL — ABNORMAL LOW (ref 3.5–5.0)
Alkaline Phosphatase: 215 U/L — ABNORMAL HIGH (ref 40–150)
BILIRUBIN TOTAL: 1.89 mg/dL — AB (ref 0.20–1.20)
BUN: 38.4 mg/dL — AB (ref 7.0–26.0)
CALCIUM: 9.4 mg/dL (ref 8.4–10.4)
CO2: 23 meq/L (ref 22–29)
CREATININE: 2 mg/dL — AB (ref 0.7–1.3)
Chloride: 100 mEq/L (ref 98–109)
EGFR: 31 mL/min/{1.73_m2} — ABNORMAL LOW (ref >90)
Glucose: 171 mg/dl — ABNORMAL HIGH (ref 70–140)
Potassium: 4.9 mEq/L (ref 3.5–5.1)
Sodium: 134 mEq/L — ABNORMAL LOW (ref 136–145)
TOTAL PROTEIN: 7 g/dL (ref 6.4–8.3)

## 2016-12-26 MED ORDER — METHYLPREDNISOLONE 4 MG PO TBPK: ORAL_TABLET | ORAL | 0 refills | Status: AC

## 2016-12-26 NOTE — Progress Notes (Signed)
Patient ID: Shawn Espinoza, male   DOB: 06-Jun-1941, 76 y.o.   MRN: [id number]   Patient presents to be scanned and measured for diabetic shoes and inserts.

## 2016-12-27 ENCOUNTER — Encounter: Payer: Self-pay | Admitting: Nurse Practitioner

## 2016-12-27 NOTE — Assessment & Plan Note (Signed)
Patient received his most recent Keytruda infusion on 12/18/2016.  He is scheduled for restaging CT on 01/06/2017.  He is scheduled for labs, flush, visit, and his next infusion on 01/09/2017.

## 2016-12-27 NOTE — Progress Notes (Signed)
SYMPTOM MANAGEMENT CLINIC    Chief Complaint: Rash   HPI:  Shawn Espinoza 76 y.o. male diagnosed with lung cancer with liver metastasis and hepatocellular cancer.  Currently undergoing Keytruda infusions.    Oncology History   Patient presented with SOB.    Adenocarcinoma of lung Lung cancer (Resolved on 03/26/2014)   Primary site: Lung (Left)   Staging method: AJCC 7th Edition   Clinical: Stage IV (T3, N3, M1b) signed by Curt Bears, MD on 03/24/2014  3:06 PM   Summary: Stage IV (T3, N3, M1b)       Adenocarcinoma of left lung, stage 4 ([place])   02/22/2014 Imaging    CXR impression mild fullness in the left hilar region with diffuse interstitial prominence throughout the left lung, predominantly in the left lower lobe.  This may simply reflect a left lower lobe bronchopneumonia with some reactive left hilar lymphadeno      02/23/2014 Imaging    CT Chest with contrast impression abnormal findings in the left lung as described above including patchy groud-glass attenuation, thickened interstitium, left upper and left lower lobe pulmonary nodules and small to moderate left pleural effusion.        03/17/2014 Pathology Results    Lung, transbronchial biopsy, LUL postivie for adenocarcinoma       03/17/2014 Surgery    Video flexible fiberoptic bronchoscopy       03/21/2014 Initial Diagnosis    Adenocarcinoma of lung      03/30/2014 Imaging    PET scan impression findings compatible with primary bronchogenic carcinoma involving the left lung.  There is evidence of lymphangitic spread throughout the left lung.  Hypermetabolic bilateral hilar and bilateral medicastinal lymph nodes.        04/01/2014 Imaging    MRI Brain with and without contrast impression no visible intracranial metastatic disease.  Atrophy, small vessel disease, and remote lacunar infarcts.  No acute intracranial findings.       04/08/2014 Procedure    Ultrasound guided left thoracentesis impression successful  ultrasound guided left thoracentesis yielding 1.7 L of plueral fluid.        Review of Systems  Constitutional: Positive for malaise/fatigue.  Skin: Positive for itching and rash.  All other systems reviewed and are negative.   Past Medical History:  Diagnosis Date  . Acute infarction of intestine, part and extent unspecified ([place]) 04/25/2016  . 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 Arthritis   . Atrial fibrillation ([place])   . Cancer associated pain 12/03/2016  . Constipation   . Depression   . Diabetes mellitus type 2 with retinopathy ([place])   . Diverticulosis of colon   . Dysrhythmia    Atrial fib (found 07/2014)  . Fatty liver 02/29/00   abd ultrasound  . Full code status 09/26/2015  . GERD (gastroesophageal reflux disease)    occasional  . HCAP (healthcare-associated pneumonia) 11/27/2015   IV vanc/zosyn then levaquin courses x2   . Hepatocellular carcinoma ([place]) 08/21/2016  . History of diabetes mellitus, type II    resolved with diet, h/o neuropathy  . History of tobacco use quit 1990s  . Hyperlipidemia   . Hypertension   . Hypertensive retinopathy of both eyes 2015   mild  . Lobar pneumonia ([place]) 10/18/2015  . Lower back pain   . 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 Neuropathy ([place])   . Non-small cell carcinoma of left lung ([place])  02/2014   stage IIIb/IV on chemo  . Personal history of colonic adenomas 07/06/2013  . Pneumonia   . 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 Shortness of breath   . 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 Systolic murmur 0981   2Decho - normal LV fxn, EF 55%, mild AS, biatrial enlargement    Past Surgical History:  Procedure Laterality Date  . CATARACT EXTRACTION Right 03/2015   Dr [name]  . CHEST TUBE INSERTION Left 08/26/2014   Procedure: INSERTION OF LEFT  PLEURAL DRAINAGE CATHETER;  Surgeon: Ivin Poot, MD;  Location:  [place];  Service: Thoracic;  Laterality: Left;  . COLONOSCOPY  2004  . COLONOSCOPY  2014   tubular adenoma x1, mod diverticulosis ([place])  . FLEXIBLE BRONCHOSCOPY  02/2014   WNL  . IR GENERIC HISTORICAL  11/28/2016   IR US GUIDE VASC ACCESS RIGHT 11/28/2016 Jacqulynn Cadet, MD [facility]  . IR GENERIC HISTORICAL  11/28/2016   IR ANGIOGRAM SELECTIVE EACH ADDITIONAL VESSEL 11/28/2016 Jacqulynn Cadet, MD [facility]  . IR GENERIC HISTORICAL  11/28/2016   IR ANGIOGRAM VISCERAL SELECTIVE 11/28/2016 Jacqulynn Cadet, MD [facility]  . IR GENERIC HISTORICAL  11/28/2016   IR ANGIOGRAM SELECTIVE EACH ADDITIONAL VESSEL 11/28/2016 Jacqulynn Cadet, MD [facility]  . IR GENERIC HISTORICAL  11/28/2016   IR ANGIOGRAM SELECTIVE EACH ADDITIONAL VESSEL 11/28/2016 Jacqulynn Cadet, MD [facility]  . IR GENERIC HISTORICAL  11/28/2016   IR ANGIOGRAM SELECTIVE EACH ADDITIONAL VESSEL 11/28/2016 Jacqulynn Cadet, MD [facility]  . IR GENERIC HISTORICAL  11/28/2016   IR EMBO ARTERIAL NOT HEMORR HEMANG INC GUIDE ROADMAPPING 11/28/2016 Jacqulynn Cadet, MD [facility]  . IR GENERIC HISTORICAL  11/28/2016   IR ANGIOGRAM VISCERAL SELECTIVE 11/28/2016 Jacqulynn Cadet, MD [facility]  . IR GENERIC HISTORICAL  11/28/2016   IR ANGIOGRAM SELECTIVE EACH ADDITIONAL VESSEL 11/28/2016 Jacqulynn Cadet, MD [facility]  . IR GENERIC HISTORICAL  11/13/2016   IR RADIOLOGIST EVAL & MGMT 11/13/2016 Jacqulynn Cadet, MD [facility]  . PORTACATH PLACEMENT Right 03/31/2015   Procedure: INSERTION PORT-A-CATH;  Surgeon: Ivin Poot, MD;  Location: [place];  Service: Thoracic;  Laterality: Right;  . 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[place]); HLD (hyperlipidemia); History of tobacco use; Essential hypertension; Hemorrhoids; DIVERTICULOSIS, COLON; FATTY LIVER DISEASE; Medicare annual wellness visit, subsequent; Positive hepatitis C antibody test; Systolic murmur; Lower back pain; Polycythemia; BPH (benign prostatic hypertrophy); Right knee pain; Personal history of colonic adenoma; Cough; DOE (dyspnea on exertion); Adenocarcinoma of left lung, stage 4 ([place]); Recurrent left pleural effusion; Skin rash; 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[place]); CHF (congestive heart failure) ([place]); Thrombocytopenia ([place]); Peripheral edema; Orthostatic hypotension; Neoplastic malignant related fatigue; Paroxysmal a-fib ([place]); Hypothyroid; Hearing loss; Advanced care planning/counseling discussion; Bilateral leg pain; Encounter for antineoplastic chemotherapy; Myalgia; Neutropenia, drug-induced ([place]); Altered mental state; Constipation; Long term current use of anticoagulant therapy; Esophagitis; Epistaxis; Dehydration; Full code status; Hypoalbuminemia due to protein-calorie malnutrition ([place]); Hypoglycemia; Aortic stenosis, moderate; Antineoplastic chemotherapy induced anemia; Sacral pressure ulcer; Central line complication; Diabetes mellitus type 2, controlled, with complications ([place]); Loss of weight;  Port catheter in place; Stroke (cerebrum) [facility]); Neck pain, musculoskeletal; Rhinorrhea; Hepatocellular carcinoma ([place]); Encounter for antineoplastic immunotherapy; Dry ARMD; Cancer associated pain; and Renal insufficiency on his problem list.    is allergic to statins; diltiazem hcl; doxazosin mesylate; hydrocodone; nifedipine; red yeast rice; and sertraline.  Allergies as of 12/26/2016      Reactions   Statins Other (See Comments)   All statins cause muscle aches   Diltiazem Hcl Other (See Comments)   Dizziness   Doxazosin Mesylate Other (See Comments)    Headaches   Hydrocodone Other (See Comments)   constipation   Nifedipine Swelling   Leg swelling   Red Yeast Rice Other (See Comments)   Muscle aches   Sertraline Other (See Comments)   oversedation      Medication List       Accurate as of 12/26/16 11:59 PM. Always use your most recent med list.          acetaminophen 500 MG tablet Commonly known as:  TYLENOL Take 500 mg by mouth 2 (two) times daily. Reported on 05/06/2016   albuterol 108 (90 Base) MCG/ACT inhaler Commonly known as:  PROVENTIL HFA;VENTOLIN HFA Inhale 2 puffs into the lungs every 6 (six) hours as needed for wheezing or shortness of breath.   benzonatate 100 MG capsule Commonly known as:  TESSALON Take 1-2 capsules (100-200 mg total) by mouth 3 (three) times daily as needed for cough.   ELIQUIS 5 MG Tabs tablet Generic drug:  apixaban TAKE ONE TABLET BY MOUTH TWICE DAILY   glipiZIDE 5 MG tablet Commonly known as:  GLUCOTROL Take 1 tablet (5 mg total) by mouth daily before breakfast.   glucose blood test strip Commonly known as:  ONE TOUCH ULTRA TEST Use to check sugar once daily and as needed. Dx: E09.9   guaiFENesin 600 MG 12 hr tablet Commonly known as:  MUCINEX Take 600 mg by mouth 2 (two) times daily as needed for cough or to loosen phlegm.   hydrocortisone 25 MG suppository Commonly known as:  ANUSOL-HC Place 1 suppository (25 mg total) rectally 2 (two) times daily.   ICAPS Caps Take 1 capsule by mouth 2 (two) times daily.   KLOR-CON M10 10 MEQ tablet Generic drug:  potassium chloride Take 2 tablets (20 mEq total) by mouth every other day.   levothyroxine 50 MCG tablet Commonly known as:  SYNTHROID Take 1 tablet (50 mcg total) by mouth daily before breakfast.   lidocaine-prilocaine cream Commonly known as:  EMLA Apply 1 application topically as needed.   methylPREDNISolone 4 MG Tbpk tablet Commonly known as:  MEDROL DOSEPAK Take as directed   metoprolol 50 MG tablet Commonly  known as:  LOPRESSOR TAKE ONE-HALF TABLET BY MOUTH TWICE DAILY   ONETOUCH DELICA LANCETS 14G Misc Use to check sugar once daily and as needed. Dx:E09.9   polyethylene glycol packet Commonly known as:  MIRALAX / GLYCOLAX Take 8.5 g by mouth daily.   ROBAXIN 500 MG tablet Generic drug:  methocarbamol Take 500 mg by mouth 3 (three) times daily as needed. Muscle pain   spironolactone 50 MG tablet Commonly known as:  ALDACTONE Take 1 tablet (50 mg total) by mouth daily.   sucralfate 1 g tablet Commonly known as:  CARAFATE Take 1 tablet (1 g total) by mouth 3 (three) times daily before meals.   torsemide 20 MG tablet Commonly known as:  DEMADEX Take 1 tablet (20 mg total) by mouth every other day.   traMADol  50 MG tablet Commonly known as:  ULTRAM Take 1 tablet (50 mg total) by mouth every 6 (six) hours as needed.        PHYSICAL EXAMINATION  Oncology Vitals 12/26/2016 12/18/2016  Height 175 cm 175 cm  Weight 76.794 kg 76.34 kg  Weight (lbs) 169 lbs 5 oz 168 lbs 5 oz  BMI (kg/m2) 25 kg/m2 24.85 kg/m2  Temp 98.7 (No Data)  Pulse 79 78  Resp 18 18  SpO2 97 99  BSA (m2) 1.93 m2 1.93 m2   BP Readings from Last 2 Encounters:  12/26/16 129/72  12/18/16 132/88    Physical Exam  Constitutional: He is oriented to person, place, and time. Vital signs are normal. He appears malnourished. He appears unhealthy. He appears cachectic.  HENT:  Head: Normocephalic and atraumatic.  Mouth/Throat: Oropharynx is clear and moist.  Eyes: Conjunctivae and EOM are normal. Pupils are equal, round, and reactive to light. Right eye exhibits no discharge. Left eye exhibits no discharge. No scleral icterus.  Neck: Normal range of motion. Neck supple. No JVD present. No tracheal deviation present. No thyromegaly present.  Cardiovascular: Normal rate, regular rhythm, normal heart sounds and intact distal pulses.   Pulmonary/Chest: Effort normal and breath sounds normal. No respiratory distress. He  has no wheezes. He has no rales. He exhibits no tenderness.  Abdominal: Soft. Bowel sounds are normal. He exhibits distension. He exhibits no mass. There is no tenderness. There is no rebound and no guarding.  Mild abdominal distention and trace firmness.  Most likely, secondary to patient's liver metastasis and hepatocellular cancer.  Musculoskeletal: Normal range of motion. He exhibits no edema, tenderness or deformity.  Lymphadenopathy:    He has no cervical adenopathy.  Neurological: He is alert and oriented to person, place, and time. Gait normal.  Skin: Skin is warm and dry. Rash noted. No erythema. No pallor.  Psychiatric: Affect normal.  Nursing note and vitals reviewed.   LABORATORY DATA:. Appointment on 12/26/2016  Component Date Value Ref Range Status  . WBC 12/26/2016 5.8  4.0 - 10.3 10e3/uL Final  . NEUT# 12/26/2016 4.5  1.5 - 6.5 10e3/uL Final  . HGB 12/26/2016 16.0  13.0 - 17.1 g/dL Final  . HCT 12/26/2016 50.4* 38.4 - 49.9 % Final  . Platelets 12/26/2016 247  140 - 400 10e3/uL Final  . MCV 12/26/2016 84.6  79.3 - 98.0 fL Final  . MCH 12/26/2016 26.8* 27.2 - 33.4 pg Final  . MCHC 12/26/2016 31.7* 32.0 - 36.0 g/dL Final  . RBC 12/26/2016 5.96* 4.20 - 5.82 10e6/uL Final  . RDW 12/26/2016 19.4* 11.0 - 14.6 % Final  . lymph# 12/26/2016 0.5* 0.9 - 3.3 10e3/uL Final  . MONO# 12/26/2016 0.6  0.1 - 0.9 10e3/uL Final  . Eosinophils Absolute 12/26/2016 0.1  0.0 - 0.5 10e3/uL Final  . Basophils Absolute 12/26/2016 0.0  0.0 - 0.1 10e3/uL Final  . NEUT% 12/26/2016 78.2* 39.0 - 75.0 % Final  . LYMPH% 12/26/2016 9.2* 14.0 - 49.0 % Final  . MONO% 12/26/2016 10.2  0.0 - 14.0 % Final  . EOS% 12/26/2016 1.9  0.0 - 7.0 % Final  . BASO% 12/26/2016 0.5  0.0 - 2.0 % Final  . Sodium 12/26/2016 134* 136 - 145 mEq/L Final  . Potassium 12/26/2016 4.9  3.5 - 5.1 mEq/L Final  . Chloride 12/26/2016 100  98 - 109 mEq/L Final  . CO2 12/26/2016 23  22 - 29 mEq/L Final  . Glucose 12/26/2016 171*  70 -  140 mg/dl Final  . BUN 12/26/2016 38.4* 7.0 - 26.0 mg/dL Final  . Creatinine 12/26/2016 2.0* 0.7 - 1.3 mg/dL Final  . Total Bilirubin 12/26/2016 1.89* 0.20 - 1.20 mg/dL Final  . Alkaline Phosphatase 12/26/2016 215* 40 - 150 U/L Final  . AST 12/26/2016 105* 5 - 34 U/L Final  . ALT 12/26/2016 63* 0 - 55 U/L Final  . Total Protein 12/26/2016 7.0  6.4 - 8.3 g/dL Final  . Albumin 12/26/2016 3.2* 3.5 - 5.0 g/dL Final  . Calcium 12/26/2016 9.4  8.4 - 10.4 mg/dL Final  . Anion Gap 12/26/2016 12* 3 - 11 mEq/L Final  . EGFR 12/26/2016 31* >90 ml/min/1.73 m2 Final    RADIOGRAPHIC STUDIES: No results found.  ASSESSMENT/PLAN:    Skin rash Patient has history of chronic rash issues; but states he has had increasing generalized pruritic rash since he received his last Keytruda infusion on 12/18/2016.  He states he has tried hydrocortisone cream to the rash with only minimal effectiveness.  Exam today reveals patient has a red rash to his generalized body.  He has scratched so much to his bilateral forearms that he has bruising.  He has some scabbed areas to the front of his shins as well.  Will prescribe a Medrol Dosepak to the patient to see if this helps.  Also, patient was given both verbal and written instructions regarding the use of Benadryl and Pepcid as well.  Patient was advised to call/return or go directly to the emergency department for any worsening symptoms whatsoever.  Renal insufficiency Patient has history of chronic renal insufficiency.  Creatinine remains elevated.  We'll continue to monitor.  Hepatocellular carcinoma [facility]) Patient received his most recent Keytruda infusion on 12/18/2016.  He is scheduled for restaging CT on 01/06/2017.  He is scheduled for labs, flush, visit, and his next infusion on 01/09/2017.  Dehydration Patient continues with mild dehydration and hyponatremia; the patient stated he would try to push fluids more.   Patient stated understanding of  all instructions; and was in agreement with this plan of care. The patient knows to call the clinic with any problems, questions or concerns.   Total time spent with patient was 25 minutes;  with greater than 75 percent of that time spent in face to face counseling regarding patient's symptoms,  and coordination of care and follow up.  Disclaimer:This dictation was prepared with Dragon/digital dictation along with [employer]. Any transcriptional errors that result from this process are unintentional.  Drue Second, NP 12/27/2016   ADDENDUM: Hematology/Oncology Attending: I had a face to face encounter with the patient. I recommended his care plan. This is a very pleasant 76 years old white male with multiple medical problems and 2 diagnosis of malignancy including metastatic non-small cell lung cancer, adenocarcinoma status post several chemotherapy regimens. He also was diagnosed with unresectable hepatocellular carcinoma and currently undergoing treatment with immunotherapy with Ketruda (pembrolizumab). Has been tolerating his treatment well. He came to the clinic today complaining of extensive skin rash involving the upper extremities as well as the chest and back. It is maculopapular in nature. It is also pruritic.  The patient did not start any new medication except for Synthroid. I had a discussion with the patient today about his condition. For the skin rash, we will start the patient on Medrol Dosepak in addition to Benadryl. For the metastatic non-small cell lung cancer and unresectable hepatocellular carcinoma, we will arrange for the patient to have repeat CT scan of  the chest, abdomen and pelvis for restaging of his disease. For the hypothyroidism, the patient will continue on levothyroxine and will continue to monitor his TSH closely. If the skin rash continues to get worse, we may change the levothyroxine to a brand name thyroxine since his skin rash started after his  treatment with levothyroxine. The patient would come back for follow-up visit as previously scheduled. He was advised to call immediately if he has any concerning symptoms in the interval.  Disclaimer: This note was dictated with voice recognition software. Similar sounding words can inadvertently be transcribed and may be missed upon review. Eilleen Kempf., MD 12/27/16

## 2016-12-27 NOTE — Assessment & Plan Note (Signed)
Patient continues with mild dehydration and hyponatremia; the patient stated he would try to push fluids more.

## 2016-12-27 NOTE — Assessment & Plan Note (Signed)
Patient has history of chronic renal insufficiency.  Creatinine remains elevated.  We'll continue to monitor.

## 2016-12-27 NOTE — Assessment & Plan Note (Signed)
Patient has history of chronic rash issues; but states he has had increasing generalized pruritic rash since he received his last Keytruda infusion on 12/18/2016.  He states he has tried hydrocortisone cream to the rash with only minimal effectiveness.  Exam today reveals patient has a red rash to his generalized body.  He has scratched so much to his bilateral forearms that he has bruising.  He has some scabbed areas to the front of his shins as well.  Will prescribe a Medrol Dosepak to the patient to see if this helps.  Also, patient was given both verbal and written instructions regarding the use of Benadryl and Pepcid as well.  Patient was advised to call/return or go directly to the emergency department for any worsening symptoms whatsoever.

## 2017-01-02 ENCOUNTER — Ambulatory Visit: Payer: Medicare Other | Admitting: Internal Medicine

## 2017-01-02 ENCOUNTER — Other Ambulatory Visit: Payer: Medicare Other

## 2017-01-02 ENCOUNTER — Ambulatory Visit: Payer: Medicare Other

## 2017-01-06 ENCOUNTER — Other Ambulatory Visit: Payer: Self-pay | Admitting: Internal Medicine

## 2017-01-06 ENCOUNTER — Ambulatory Visit (HOSPITAL_COMMUNITY)
Admission: RE | Admit: 2017-01-06 | Discharge: 2017-01-06 | Disposition: A | Discharge status: Home / Self Care | Payer: Medicare Other | Source: Ambulatory Visit | Attending: Internal Medicine | Admitting: Internal Medicine

## 2017-01-06 DIAGNOSIS — G893 Neoplasm related pain (acute) (chronic): Secondary | ICD-10-CM | POA: Diagnosis not present

## 2017-01-06 DIAGNOSIS — K8020 Calculus of gallbladder without cholecystitis without obstruction: Secondary | ICD-10-CM | POA: Diagnosis not present

## 2017-01-06 DIAGNOSIS — C3492 Malignant neoplasm of unspecified part of left bronchus or lung: Secondary | ICD-10-CM | POA: Diagnosis not present

## 2017-01-06 DIAGNOSIS — E860 Dehydration: Secondary | ICD-10-CM | POA: Insufficient documentation

## 2017-01-06 DIAGNOSIS — C220 Liver cell carcinoma: Secondary | ICD-10-CM | POA: Diagnosis not present

## 2017-01-06 DIAGNOSIS — C7801 Secondary malignant neoplasm of right lung: Secondary | ICD-10-CM | POA: Insufficient documentation

## 2017-01-06 DIAGNOSIS — C7802 Secondary malignant neoplasm of left lung: Secondary | ICD-10-CM | POA: Diagnosis not present

## 2017-01-06 DIAGNOSIS — R188 Other ascites: Secondary | ICD-10-CM | POA: Insufficient documentation

## 2017-01-06 DIAGNOSIS — C787 Secondary malignant neoplasm of liver and intrahepatic bile duct: Secondary | ICD-10-CM | POA: Diagnosis not present

## 2017-01-08 ENCOUNTER — Telehealth: Payer: Self-pay | Admitting: *Deleted

## 2017-01-08 NOTE — Telephone Encounter (Signed)
Wife called to cancel appointments for 01/09/17 due to weather. Message sent to schedulers.

## 2017-01-09 ENCOUNTER — Telehealth: Payer: Self-pay | Admitting: *Deleted

## 2017-01-09 ENCOUNTER — Ambulatory Visit: Payer: Medicare Other | Admitting: Internal Medicine

## 2017-01-09 ENCOUNTER — Ambulatory Visit: Payer: Medicare Other

## 2017-01-09 ENCOUNTER — Other Ambulatory Visit: Payer: Medicare Other

## 2017-01-09 NOTE — Telephone Encounter (Signed)
Call and spoke to the wife. Patient still can't come tomorrow, advised MD and desk to send scheduling message, may need to move all appts. Wife is aware

## 2017-01-10 ENCOUNTER — Telehealth: Payer: Self-pay | Admitting: *Deleted

## 2017-01-10 NOTE — Telephone Encounter (Signed)
Oncology Nurse Navigator Documentation  Oncology Nurse Navigator Flowsheets 01/10/2017  Navigator Location [facility]  Navigator Encounter Type Telephone/I followed up with Dr. [name] regarding Shawn Espinoza appt.  He states he can see patient on 01/13/17.  I called and spoke with Shawn Espinoza and gave her the appt on 01/13/17.  She verbalized understanding of appt  Telephone Outgoing Call  Treatment Phase Treatment  Barriers/Navigation Needs Coordination of Care  Interventions Coordination of Care  Coordination of Care Appts  Acuity Level 2  Time Spent with Patient 30

## 2017-01-10 NOTE — Telephone Encounter (Signed)
Per 1/18 staff message I have scheduled appts and notified the patient.

## 2017-01-13 ENCOUNTER — Encounter: Payer: Self-pay | Admitting: Internal Medicine

## 2017-01-13 ENCOUNTER — Other Ambulatory Visit (HOSPITAL_BASED_OUTPATIENT_CLINIC_OR_DEPARTMENT_OTHER): Payer: Medicare Other

## 2017-01-13 ENCOUNTER — Other Ambulatory Visit: Payer: Medicare Other

## 2017-01-13 ENCOUNTER — Ambulatory Visit: Payer: Medicare Other

## 2017-01-13 ENCOUNTER — Ambulatory Visit: Payer: Medicare Other | Admitting: Nurse Practitioner

## 2017-01-13 ENCOUNTER — Ambulatory Visit (HOSPITAL_BASED_OUTPATIENT_CLINIC_OR_DEPARTMENT_OTHER): Payer: Medicare Other

## 2017-01-13 ENCOUNTER — Ambulatory Visit (HOSPITAL_BASED_OUTPATIENT_CLINIC_OR_DEPARTMENT_OTHER): Payer: Medicare Other | Admitting: Internal Medicine

## 2017-01-13 ENCOUNTER — Telehealth: Payer: Self-pay | Admitting: Medical Oncology

## 2017-01-13 VITALS — BP 133/69 | HR 80 | Resp 17

## 2017-01-13 VITALS — BP 123/68 | HR 84 | Temp 97.6°F | Resp 17 | Ht 69.0 in | Wt 170.4 lb

## 2017-01-13 DIAGNOSIS — R188 Other ascites: Secondary | ICD-10-CM | POA: Diagnosis not present

## 2017-01-13 DIAGNOSIS — C3492 Malignant neoplasm of unspecified part of left bronchus or lung: Secondary | ICD-10-CM | POA: Diagnosis not present

## 2017-01-13 DIAGNOSIS — R530 Neoplastic (malignant) related fatigue: Secondary | ICD-10-CM

## 2017-01-13 DIAGNOSIS — C220 Liver cell carcinoma: Secondary | ICD-10-CM

## 2017-01-13 DIAGNOSIS — I5032 Chronic diastolic (congestive) heart failure: Secondary | ICD-10-CM

## 2017-01-13 DIAGNOSIS — T380X5A Adverse effect of glucocorticoids and synthetic analogues, initial encounter: Secondary | ICD-10-CM

## 2017-01-13 DIAGNOSIS — I10 Essential (primary) hypertension: Secondary | ICD-10-CM | POA: Diagnosis not present

## 2017-01-13 DIAGNOSIS — N189 Chronic kidney disease, unspecified: Secondary | ICD-10-CM | POA: Diagnosis not present

## 2017-01-13 DIAGNOSIS — Z79899 Other long term (current) drug therapy: Secondary | ICD-10-CM | POA: Diagnosis not present

## 2017-01-13 DIAGNOSIS — E46 Unspecified protein-calorie malnutrition: Secondary | ICD-10-CM

## 2017-01-13 DIAGNOSIS — K7689 Other specified diseases of liver: Secondary | ICD-10-CM

## 2017-01-13 DIAGNOSIS — E860 Dehydration: Secondary | ICD-10-CM

## 2017-01-13 DIAGNOSIS — I509 Heart failure, unspecified: Secondary | ICD-10-CM

## 2017-01-13 DIAGNOSIS — Z7189 Other specified counseling: Secondary | ICD-10-CM | POA: Insufficient documentation

## 2017-01-13 DIAGNOSIS — N289 Disorder of kidney and ureter, unspecified: Secondary | ICD-10-CM | POA: Diagnosis not present

## 2017-01-13 DIAGNOSIS — Z5111 Encounter for antineoplastic chemotherapy: Secondary | ICD-10-CM

## 2017-01-13 DIAGNOSIS — E875 Hyperkalemia: Secondary | ICD-10-CM

## 2017-01-13 DIAGNOSIS — G893 Neoplasm related pain (acute) (chronic): Secondary | ICD-10-CM

## 2017-01-13 DIAGNOSIS — E099 Drug or chemical induced diabetes mellitus without complications: Secondary | ICD-10-CM

## 2017-01-13 HISTORY — DX: Other ascites: R18.8

## 2017-01-13 LAB — COMPREHENSIVE METABOLIC PANEL
ALT: 76 U/L — ABNORMAL HIGH (ref 0–55)
ANION GAP: 17 meq/L — AB (ref 3–11)
AST: 130 U/L — ABNORMAL HIGH (ref 5–34)
Albumin: 2.7 g/dL — ABNORMAL LOW (ref 3.5–5.0)
Alkaline Phosphatase: 185 U/L — ABNORMAL HIGH (ref 40–150)
BUN: 80 mg/dL — ABNORMAL HIGH (ref 7.0–26.0)
CHLORIDE: 96 meq/L — AB (ref 98–109)
CO2: 16 meq/L — AB (ref 22–29)
Calcium: 9.4 mg/dL (ref 8.4–10.4)
Creatinine: 3.5 mg/dL (ref 0.7–1.3)
EGFR: 16 mL/min/{1.73_m2} — AB (ref >90)
GLUCOSE: 145 mg/dL — AB (ref 70–140)
POTASSIUM: 6 meq/L — AB (ref 3.5–5.1)
SODIUM: 129 meq/L — AB (ref 136–145)
Total Bilirubin: 4.49 mg/dL (ref 0.20–1.20)
Total Protein: 6.7 g/dL (ref 6.4–8.3)

## 2017-01-13 LAB — TSH: TSH: 3.944 m(IU)/L (ref 0.320–4.118)

## 2017-01-13 LAB — CBC WITH DIFFERENTIAL/PLATELET
BASO%: 0.5 % (ref 0.0–2.0)
BASOS ABS: 0.1 10*3/uL (ref 0.0–0.1)
EOS ABS: 0.1 10*3/uL (ref 0.0–0.5)
EOS%: 0.7 % (ref 0.0–7.0)
HCT: 47.2 % (ref 38.4–49.9)
HGB: 15.7 g/dL (ref 13.0–17.1)
LYMPH%: 6.5 % — AB (ref 14.0–49.0)
MCH: 26.4 pg — ABNORMAL LOW (ref 27.2–33.4)
MCHC: 33.3 g/dL (ref 32.0–36.0)
MCV: 79.3 fL (ref 79.3–98.0)
MONO#: 0.7 10*3/uL (ref 0.1–0.9)
MONO%: 6.9 % (ref 0.0–14.0)
NEUT#: 9.2 10*3/uL — ABNORMAL HIGH (ref 1.5–6.5)
NEUT%: 85.4 % — ABNORMAL HIGH (ref 39.0–75.0)
PLATELETS: 233 10*3/uL (ref 140–400)
RBC: 5.95 10*6/uL — AB (ref 4.20–5.82)
RDW: 23.5 % — ABNORMAL HIGH (ref 11.0–14.6)
WBC: 10.7 10*3/uL — AB (ref 4.0–10.3)
lymph#: 0.7 10*3/uL — ABNORMAL LOW (ref 0.9–3.3)
nRBC: 0 % (ref 0–0)

## 2017-01-13 MED ORDER — DEXTROSE-NACL 5-0.45 % IV SOLN: INTRAVENOUS | Status: DC

## 2017-01-13 MED ORDER — KAYEXALATE PO POWD: 30.0000 g | Freq: Once | ORAL | 0 refills | Status: AC

## 2017-01-13 MED ORDER — DEXTROSE-NACL 5-0.45 % IV SOLN
INTRAVENOUS | Status: DC
  Administered 2017-01-13: 12:00:00 via INTRAVENOUS

## 2017-01-13 MED ORDER — HYDROXYZINE HCL 10 MG PO TABS: 10.0000 mg | ORAL_TABLET | Freq: Three times a day (TID) | ORAL | 0 refills | Status: AC | PRN

## 2017-01-13 NOTE — Patient Instructions (Signed)
No treatment today, D5-1/2 NS 1 liter

## 2017-01-13 NOTE — Progress Notes (Signed)
[facility] [phone]   Fax:([phone] [place], MD [place] [address]  DIAGNOSIS:  1) metastatic non-small cell lung cancer initially diagnosed as stage IIIB adenocarcinoma in March 2015. 2) unresectable hepatocellular carcinoma diagnosed in August 2017.  PRIOR THERAPY: 1) Systemic chemotherapy with carboplatin for AUC of 5 and Alimta 500 mg/M2 every 3 weeks. First dose expected on 04/13/2014. Status post 6 cycles. 2) Maintenance chemotherapy with single agent Alimta 500 mg/M2 every 3 weeks. First cycle on 09/06/2014. Status post 3 cycles. 3) Second course of systemic chemotherapy with carboplatin for AUC of 5 and Alimta 500 MG/M2 every 3 weeks. First cycle 11/08/2014. Status post 6 cycles. 4) Immunotherapy with Nivolumab 3 MG/KG every 2 weeks. First dose 05/24/2015. Status post 3 cycles. 5) Systemic chemotherapy with docetaxel 60 MG/M2 and Cyramza 10 MG/KG every 3 weeks status post 6 cycles. Last dose was given 11/21/2015 with stable disease. 6) Systemic chemotherapy with docetaxel 75 MG/M2 every 3 weeks. First cycle 05/08/2016. Status post one cycle. Discontinued secondary to intolerance to the Neulasta injection. 7) Systemic chemotherapy with docetaxel 25 MG/M2 every weekly. First cycle 05/29/2016. Status post 9 cycles, last dose was given 07/24/2016. Discontinued secondary to disease progression. 8) 1) Systemic chemotherapy with carboplatin for AUC of 5 and Alimta 500 mg/M2 every 3 weeks. First dose expected on 04/13/2014. Status post 6 cycles. 2) Maintenance chemotherapy with single agent Alimta 500 mg/M2 every 3 weeks. First cycle on 09/06/2014. Status post 3 cycles. 3) Second course of systemic chemotherapy with carboplatin for AUC of 5 and Alimta 500 MG/M2 every 3 weeks. First cycle 11/08/2014. Status post 6 cycles. 4) Immunotherapy with Nivolumab 3 MG/KG every 2 weeks. First dose  05/24/2015. Status post 3 cycles. 5) Systemic chemotherapy with docetaxel 60 MG/M2 and Cyramza 10 MG/KG every 3 weeks status post 6 cycles. Last dose was given 11/21/2015 with stable disease. 6) Systemic chemotherapy with docetaxel 75 MG/M2 every 3 weeks. First cycle 05/08/2016. Status post one cycle. Discontinued secondary to intolerance to the Neulasta injection. 7) Systemic chemotherapy with docetaxel 25 MG/M2 every weekly. First cycle 05/29/2016. Status post 9 cycles, last dose was given 07/24/2016. Discontinued secondary to disease progression. 8) Ketruda (pembrolizumab) 200 mg IV every 3 weeks, first dose was given on 08/29/2016, status post 6 cycles. Discontinued secondary to disease progression.  CURRENT THERAPY: Palliative and hospice care.  INTERVAL HISTORY: Shawn Espinoza 76 y.o. male returns to the clinic today for follow-up visit accompanied by his wife. The patient is complaining today of increasing fatigue and weakness as well as abdominal distention. He has a lot of itching and he was started on Medrol Dosepak with no improvement. He has occasional dizzy spells. He is also complaining of insomnia secondary to the itching as well as night sweats. He continues to have pain on the right upper quadrant. He has recent fall secondary to weakness. He denied having any chest pain but continues to have shortness of breath with exertion with no cough or hemoptysis. His weight has been stable. He completed 6 cycles of treatment with immunotherapy with Ketruda (pembrolizumab) and tolerated the treatment well. He had repeat CT scan of the chest, abdomen and pelvis performed recently and he is here for evaluation and discussion of his scan results.  MEDICAL HISTORY: Past Medical History:  Diagnosis Date  . Acute infarction of intestine, part and extent unspecified ([place]) 04/25/2016  . 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 Arthritis   . Atrial fibrillation  ([place])   . Cancer associated pain 12/03/2016  . Constipation   . Depression   . Diabetes mellitus type 2 with retinopathy ([place])   . Diverticulosis of colon   . Dysrhythmia    Atrial fib (found 07/2014)  . Fatty liver 02/29/00   abd ultrasound  . Full code status 09/26/2015  . GERD (gastroesophageal reflux disease)    occasional  . HCAP (healthcare-associated pneumonia) 11/27/2015   IV vanc/zosyn then levaquin courses x2   . Hepatocellular carcinoma ([place]) 08/21/2016  . History of diabetes mellitus, type II    resolved with diet, h/o neuropathy  . History of tobacco use quit 1990s  . Hyperlipidemia   . Hypertension   . Hypertensive retinopathy of both eyes 2015   mild  . Lobar pneumonia ([place]) 10/18/2015  . Lower back pain   . 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 Neuropathy ([place])   . Non-small cell carcinoma of left lung ([place]) 02/2014   stage IIIb/IV on chemo  . Personal history of colonic adenomas 07/06/2013  . Pneumonia   . 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 Shortness of breath   . 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 Systolic murmur 2376   2Decho - normal LV fxn, EF 55%, mild AS, biatrial enlargement    ALLERGIES:  is allergic to statins; diltiazem hcl; doxazosin mesylate; hydrocodone; nifedipine; red yeast rice; and sertraline.  MEDICATIONS:  Current Outpatient Prescriptions  Medication Sig Dispense Refill  . acetaminophen (TYLENOL) 500 MG tablet Take 500 mg by mouth 2 (two) times daily. Reported on 05/06/2016    . albuterol (PROVENTIL HFA;VENTOLIN HFA) 108 (90 BASE) MCG/ACT inhaler Inhale 2 puffs into the lungs every 6 (six) hours as needed for wheezing or shortness of breath. 1 Inhaler 3  . benzonatate (TESSALON) 100 MG capsule Take 1-2 capsules (100-200 mg total) by mouth 3 (three) times daily as needed for cough. 40 capsule 1  . ELIQUIS 5 MG TABS tablet TAKE ONE TABLET BY MOUTH TWICE DAILY 180  tablet 1  . glipiZIDE (GLUCOTROL) 5 MG tablet Take 1 tablet (5 mg total) by mouth daily before breakfast. (Patient not taking: Reported on 12/26/2016) 30 tablet 6  . glucose blood (ONE TOUCH ULTRA TEST) test strip Use to check sugar once daily and as needed. Dx: E09.9 100 each 3  . guaiFENesin (MUCINEX) 600 MG 12 hr tablet Take 600 mg by mouth 2 (two) times daily as needed for cough or to loosen phlegm.     . hydrocortisone (ANUSOL-HC) 25 MG suppository Place 1 suppository (25 mg total) rectally 2 (two) times daily. (Patient not taking: Reported on 12/26/2016) 12 suppository 0  . KLOR-CON M10 10 MEQ tablet Take 2 tablets (20 mEq total) by mouth every other day. 30 tablet 6  . levothyroxine (SYNTHROID) 50 MCG tablet Take 1 tablet (50 mcg total) by mouth daily before breakfast. 30 tablet 2  . lidocaine-prilocaine (EMLA) cream Apply 1 application topically as needed. (Patient not taking: Reported on 12/18/2016) 30 g 1  . methocarbamol (ROBAXIN) 500 MG tablet Take 500 mg by mouth 3 (three) times daily as needed. Muscle pain    . methylPREDNISolone (MEDROL DOSEPAK) 4 MG TBPK tablet Take as directed 21 tablet 0  . metoprolol (LOPRESSOR) 50 MG tablet TAKE ONE-HALF TABLET BY MOUTH TWICE  DAILY 90 tablet 1  . Multiple Vitamins-Minerals (ICAPS) CAPS Take 1 capsule by mouth 2 (two) times daily.     Shawn Espinoza Use to check sugar once daily and as needed. Dx:E09.9 100 each 3  . polyethylene glycol (MIRALAX / GLYCOLAX) packet Take 8.5 g by mouth daily.     Marland Kitchen spironolactone (ALDACTONE) 50 MG tablet Take 1 tablet (50 mg total) by mouth daily.    . sucralfate (CARAFATE) 1 g tablet Take 1 tablet (1 g total) by mouth 3 (three) times daily before meals. (Patient not taking: Reported on 12/26/2016) 90 tablet 1  . torsemide (DEMADEX) 20 MG tablet Take 1 tablet (20 mg total) by mouth every other day. 30 tablet 6  . traMADol (ULTRAM) 50 MG tablet Take 1 tablet (50 mg total) by mouth every 6 (six) hours as  needed. 60 tablet 3   No current facility-administered medications for this visit.     SURGICAL HISTORY:  Past Surgical History:  Procedure Laterality Date  . CATARACT EXTRACTION Right 03/2015   Dr [name]  . CHEST TUBE INSERTION Left 08/26/2014   Procedure: INSERTION OF LEFT  PLEURAL DRAINAGE CATHETER;  Surgeon: Ivin Poot, MD;  Location: [facility];  Service: Thoracic;  Laterality: Left;  . COLONOSCOPY  2004  . COLONOSCOPY  2014   tubular adenoma x1, mod diverticulosis ([place])  . FLEXIBLE BRONCHOSCOPY  02/2014   WNL  . IR GENERIC HISTORICAL  11/28/2016   IR US GUIDE VASC ACCESS RIGHT 11/28/2016 Jacqulynn Cadet, MD [facility]  . IR GENERIC HISTORICAL  11/28/2016   IR ANGIOGRAM SELECTIVE EACH ADDITIONAL VESSEL 11/28/2016 Jacqulynn Cadet, MD [facility]  . IR GENERIC HISTORICAL  11/28/2016   IR ANGIOGRAM VISCERAL SELECTIVE 11/28/2016 Jacqulynn Cadet, MD [facility]  . IR GENERIC HISTORICAL  11/28/2016   IR ANGIOGRAM SELECTIVE EACH ADDITIONAL VESSEL 11/28/2016 Jacqulynn Cadet, MD [facility]  . IR GENERIC HISTORICAL  11/28/2016   IR ANGIOGRAM SELECTIVE EACH ADDITIONAL VESSEL 11/28/2016 Jacqulynn Cadet, MD [facility]  . IR GENERIC HISTORICAL  11/28/2016   IR ANGIOGRAM SELECTIVE EACH ADDITIONAL VESSEL 11/28/2016 Jacqulynn Cadet, MD [facility]  . IR GENERIC HISTORICAL  11/28/2016   IR EMBO ARTERIAL NOT HEMORR HEMANG INC GUIDE ROADMAPPING 11/28/2016 Jacqulynn Cadet, MD [facility]  . IR GENERIC HISTORICAL  11/28/2016   IR ANGIOGRAM VISCERAL SELECTIVE 11/28/2016 Jacqulynn Cadet, MD [facility]  . IR GENERIC HISTORICAL  11/28/2016   IR ANGIOGRAM SELECTIVE EACH ADDITIONAL VESSEL 11/28/2016 Jacqulynn Cadet, MD [facility]  . IR GENERIC HISTORICAL  11/13/2016   IR RADIOLOGIST EVAL & MGMT 11/13/2016 Jacqulynn Cadet, MD [facility]  . PORTACATH PLACEMENT Right 03/31/2015   Procedure: INSERTION PORT-A-CATH;  Surgeon: Ivin Poot, MD;  Location: [facility];  Service: Thoracic;   Laterality: Right;  . REMOVAL OF PLEURAL DRAINAGE CATHETER Left 03/31/2015   Procedure: REMOVAL OF PLEURAL DRAINAGE CATHETER;  Surgeon: Ivin Poot, MD;  Location: [facility];  Service: Thoracic;  Laterality: Left;  Marland Kitchen VIDEO BRONCHOSCOPY Bilateral 03/17/2014   Procedure: VIDEO BRONCHOSCOPY WITH FLUORO;  Surgeon: Tanda Rockers, MD;  Location[facility];  Service: Cardiopulmonary;  Laterality: Bilateral;    REVIEW OF SYSTEMS:  Constitutional: positive for anorexia, fatigue and night sweats Eyes: negative Ears, nose, mouth, throat, and face: negative Respiratory: positive for dyspnea on exertion Cardiovascular: negative Gastrointestinal: positive for abdominal pain and nausea Genitourinary:negative Integument/breast: negative Hematologic/lymphatic: negative Musculoskeletal:positive for muscle weakness Neurological: positive for dizziness Behavioral/Psych: negative Endocrine:  negative Allergic/Immunologic: negative   PHYSICAL EXAMINATION: General appearance: alert, cooperative, fatigued and no distress Head: Normocephalic, without obvious abnormality, atraumatic Neck: no adenopathy, no JVD, supple, symmetrical, trachea midline and thyroid not enlarged, symmetric, no tenderness/mass/nodules Lymph nodes: Cervical, supraclavicular, and axillary nodes normal. Resp: clear to auscultation bilaterally Back: symmetric, no curvature. ROM normal. No CVA tenderness. Cardio: regular rate and rhythm, S1, S2 normal, no murmur, click, rub or gallop GI: abnormal findings:  ascites and distended Extremities: edema 1+ Neurologic: Alert and oriented X 3, normal strength and tone. Normal symmetric reflexes. Normal coordination and gait  ECOG PERFORMANCE STATUS: 2 - Symptomatic, <50% confined to bed  Blood pressure 123/68, pulse 84, temperature 97.6 F (36.4 C), temperature source Oral, resp. rate 17, height '5\' 9"'$  (1.753 m), weight 170 lb 6.4 oz (77.3 kg), SpO2 99 %.  LABORATORY DATA: Lab Results    Component Value Date   WBC 10.7 (H) 01/13/2017   HGB 15.7 01/13/2017   HCT 47.2 01/13/2017   MCV 79.3 01/13/2017   PLT 233 01/13/2017      Chemistry      Component Value Date/Time   NA 129 (L) 01/13/2017 0938   K 6.0 (H) 01/13/2017 0938   CL 103 11/28/2016 0755   CO2 16 (L) 01/13/2017 0938   BUN 80.0 (H) 01/13/2017 0938   CREATININE 3.5 (HH) 01/13/2017 0938      Component Value Date/Time   CALCIUM 9.4 01/13/2017 0938   ALKPHOS 185 (H) 01/13/2017 0938   AST 130 (H) 01/13/2017 0938   ALT 76 (H) 01/13/2017 0938   BILITOT 4.49 ([place]) 01/13/2017 0938       RADIOGRAPHIC STUDIES: Ct Abdomen Pelvis Wo Contrast  Result Date: 01/06/2017 CLINICAL DATA:  Followup metastatic left lung adenocarcinoma. Hepatocellular carcinoma. EXAM: CT CHEST, ABDOMEN AND PELVIS WITHOUT CONTRAST TECHNIQUE: Multidetector CT imaging of the chest, abdomen and pelvis was performed following the standard protocol without IV contrast. COMPARISON:  10/29/2016 FINDINGS: CT CHEST FINDINGS Cardiovascular: No acute findings. Aortic and coronary artery atherosclerosis. Right subclavian Port-A-Cath in appropriate position. Mediastinum/Lymph Nodes: No masses or pathologically enlarged lymph nodes identified on this unenhanced exam. Lungs/Pleura: Multiple small bilateral pulmonary nodules are increased in size and number since previous study. Index nodule in right lower lobe measures 1.5 cm on image 97/7, compared to 5 mm on previous study. Index nodule in posterior left lower lobe measures 12 mm on image 104/7 compared to 6 mm on previous study. Scarring in the superior segment left lower lobe remains stable. No evidence of pulmonary consolidation or pleural effusion. Musculoskeletal:  No suspicious bone lesions identified. CT ABDOMEN AND PELVIS FINDINGS Hepatobiliary: Multiple hepatic masses are less filled visualized on today's unenhanced exam. Largest lesion involving both right and left hepatic lobes measures approximately  20.8 x 12.8 cm on images 52 and 53/series 8, compared to 17.3 x 10.9 cm previously. Lesion in the lateral segment left lobe measures 5.85.8 cm on image 54 and appears new. Another lesion in segment 2 of the left lobe measures 4.0 x 3.9 cm on image 45/8 compared to 2.4 x 1.5 cm previously. Tiny calcified gallstones again seen, without evidence of acute cholecystitis or biliary ductal dilatation. Mild to moderate ascites is new since previous study. Pancreas: No mass or inflammatory changes identified on this unenhanced exam. Spleen: Within normal limits in size. Adrenals/Urinary Tract: No evidence of urolithiasis or hydronephrosis. Unremarkable appearance of bladder. Stomach/Bowel: No evidence of obstruction, inflammatory process, or abnormal fluid collections. Vascular/Lymphatic: 1.7 cm porta hepatis  lymph node on image 63/8 shows no significant change. No other pathologically enlarged lymph nodes identified. Embolization coils seen in region of porta hepatis. No abdominal aortic aneurysm. Aortic atherosclerosis. Reproductive: Stable mildly enlarged prostate with median lobe hypertrophy indenting bladder base. Other:  None. Musculoskeletal:  No suspicious bone lesions identified. IMPRESSION: Interval progression of bilateral pulmonary metastases since prior study. Interval progression of multiple liver masses since prior study. New mild to moderate ascites. Cholelithiasis.  No radiographic evidence of cholecystitis. Electronically Signed   By: Earle Gell M.D.   On: 01/06/2017 14:37   Ct Chest Wo Contrast  Result Date: 01/06/2017 CLINICAL DATA:  Followup metastatic left lung adenocarcinoma. Hepatocellular carcinoma. EXAM: CT CHEST, ABDOMEN AND PELVIS WITHOUT CONTRAST TECHNIQUE: Multidetector CT imaging of the chest, abdomen and pelvis was performed following the standard protocol without IV contrast. COMPARISON:  10/29/2016 FINDINGS: CT CHEST FINDINGS Cardiovascular: No acute findings. Aortic and coronary artery  atherosclerosis. Right subclavian Port-A-Cath in appropriate position. Mediastinum/Lymph Nodes: No masses or pathologically enlarged lymph nodes identified on this unenhanced exam. Lungs/Pleura: Multiple small bilateral pulmonary nodules are increased in size and number since previous study. Index nodule in right lower lobe measures 1.5 cm on image 97/7, compared to 5 mm on previous study. Index nodule in posterior left lower lobe measures 12 mm on image 104/7 compared to 6 mm on previous study. Scarring in the superior segment left lower lobe remains stable. No evidence of pulmonary consolidation or pleural effusion. Musculoskeletal:  No suspicious bone lesions identified. CT ABDOMEN AND PELVIS FINDINGS Hepatobiliary: Multiple hepatic masses are less filled visualized on today's unenhanced exam. Largest lesion involving both right and left hepatic lobes measures approximately 20.8 x 12.8 cm on images 52 and 53/series 8, compared to 17.3 x 10.9 cm previously. Lesion in the lateral segment left lobe measures 5.85.8 cm on image 54 and appears new. Another lesion in segment 2 of the left lobe measures 4.0 x 3.9 cm on image 45/8 compared to 2.4 x 1.5 cm previously. Tiny calcified gallstones again seen, without evidence of acute cholecystitis or biliary ductal dilatation. Mild to moderate ascites is new since previous study. Pancreas: No mass or inflammatory changes identified on this unenhanced exam. Spleen: Within normal limits in size. Adrenals/Urinary Tract: No evidence of urolithiasis or hydronephrosis. Unremarkable appearance of bladder. Stomach/Bowel: No evidence of obstruction, inflammatory process, or abnormal fluid collections. Vascular/Lymphatic: 1.7 cm porta hepatis lymph node on image 63/8 shows no significant change. No other pathologically enlarged lymph nodes identified. Embolization coils seen in region of porta hepatis. No abdominal aortic aneurysm. Aortic atherosclerosis. Reproductive: Stable mildly  enlarged prostate with median lobe hypertrophy indenting bladder base. Other:  None. Musculoskeletal:  No suspicious bone lesions identified. IMPRESSION: Interval progression of bilateral pulmonary metastases since prior study. Interval progression of multiple liver masses since prior study. New mild to moderate ascites. Cholelithiasis.  No radiographic evidence of cholecystitis. Electronically Signed   By: Earle Gell M.D.   On: 01/06/2017 14:37    ASSESSMENT AND PLAN: This is a very pleasant 76 years old white male with: 1) stage IV non-small cell lung cancer initially diagnosed in March 2015 status post several chemotherapy regimens and most recently treated with immunotherapy with [name] (pembrolizumab) status post 6 cycles. 2) unresectable hepatocellular carcinoma: The patient is status post 6 cycles of immunotherapy with [place] (pembrolizumab). He is not a candidate for local treatment with radioembolization. He had repeat CT scan of the chest, abdomen and pelvis performed recently. I personally and  independently reviewed the scan images and discuss the results with the patient and his wife. Unfortunately the patient has evidence for disease progression in the lung as well as the liver with new mild to moderate ascites. I had a lengthy discussion with the patient and his wife about his current condition. I discussed with him the goals of care. His serum creatinine as well as liver enzymes are elevated today. His performance status is very poor. I do think the patient will be a good candidate for any further treatment at this point. I strongly recommended for him to consider palliative care and hospice. The patient and his wife are in agreement with the current plan. 3) hyperkalemia: I recommended for the patient to discontinue his current treatment with potassium chloride. I will start him on Kayexalate 30 g 1. 4) acute on chronic renal insufficiency: Secondary to dehydration. I will arrange  for the patient to receive 1 L of IV fluid today. He was also encouraged to increase his oral intake. 5) hepatic dysfunction: Secondary to disease progression in the liver. 6) new abdominal ascites: I will arrange for the patient to undergo ultrasound-guided paracentesis for drainage of the ascites. 7) diabetes mellitus: The patient was advised to continue with his current medication as prescribed by his primary care physician. 8) hypertension and congestive heart failure. He will continue with his current blood pressure medication. I will see the patient on as-needed basis at this point. He was advised to call immediately if he has any concerning symptoms in the interval. The patient voices understanding of current disease status and treatment options and is in agreement with the current care plan.  All questions were answered. The patient knows to call the clinic with any problems, questions or concerns. We can certainly see the patient much sooner if necessary.   Disclaimer: This note was dictated with voice recognition software. Similar sounding words can inadvertently be transcribed and may not be corrected upon review.

## 2017-01-13 NOTE — Telephone Encounter (Signed)
Referral done

## 2017-01-14 DIAGNOSIS — C3490 Malignant neoplasm of unspecified part of unspecified bronchus or lung: Secondary | ICD-10-CM | POA: Diagnosis not present

## 2017-01-14 DIAGNOSIS — J449 Chronic obstructive pulmonary disease, unspecified: Secondary | ICD-10-CM | POA: Diagnosis not present

## 2017-01-14 DIAGNOSIS — E1159 Type 2 diabetes mellitus with other circulatory complications: Secondary | ICD-10-CM | POA: Diagnosis not present

## 2017-01-14 DIAGNOSIS — I10 Essential (primary) hypertension: Secondary | ICD-10-CM | POA: Diagnosis not present

## 2017-01-14 DIAGNOSIS — C787 Secondary malignant neoplasm of liver and intrahepatic bile duct: Secondary | ICD-10-CM | POA: Diagnosis not present

## 2017-01-14 DIAGNOSIS — I4891 Unspecified atrial fibrillation: Secondary | ICD-10-CM | POA: Diagnosis not present

## 2017-01-15 DIAGNOSIS — E1159 Type 2 diabetes mellitus with other circulatory complications: Secondary | ICD-10-CM | POA: Diagnosis not present

## 2017-01-15 DIAGNOSIS — J449 Chronic obstructive pulmonary disease, unspecified: Secondary | ICD-10-CM | POA: Diagnosis not present

## 2017-01-15 DIAGNOSIS — I10 Essential (primary) hypertension: Secondary | ICD-10-CM | POA: Diagnosis not present

## 2017-01-15 DIAGNOSIS — C787 Secondary malignant neoplasm of liver and intrahepatic bile duct: Secondary | ICD-10-CM | POA: Diagnosis not present

## 2017-01-15 DIAGNOSIS — I4891 Unspecified atrial fibrillation: Secondary | ICD-10-CM | POA: Diagnosis not present

## 2017-01-15 DIAGNOSIS — C3490 Malignant neoplasm of unspecified part of unspecified bronchus or lung: Secondary | ICD-10-CM | POA: Diagnosis not present

## 2017-01-16 ENCOUNTER — Ambulatory Visit: Payer: Medicare Other | Admitting: Internal Medicine

## 2017-01-16 ENCOUNTER — Other Ambulatory Visit: Payer: Medicare Other

## 2017-01-16 ENCOUNTER — Telehealth: Payer: Self-pay | Admitting: *Deleted

## 2017-01-16 ENCOUNTER — Telehealth: Payer: Self-pay | Admitting: Family Medicine

## 2017-01-16 DIAGNOSIS — I469 Cardiac arrest, cause unspecified: Secondary | ICD-10-CM | POA: Diagnosis not present

## 2017-01-16 DIAGNOSIS — E1159 Type 2 diabetes mellitus with other circulatory complications: Secondary | ICD-10-CM | POA: Diagnosis not present

## 2017-01-16 DIAGNOSIS — J449 Chronic obstructive pulmonary disease, unspecified: Secondary | ICD-10-CM | POA: Diagnosis not present

## 2017-01-16 DIAGNOSIS — I10 Essential (primary) hypertension: Secondary | ICD-10-CM | POA: Diagnosis not present

## 2017-01-16 DIAGNOSIS — I4891 Unspecified atrial fibrillation: Secondary | ICD-10-CM | POA: Diagnosis not present

## 2017-01-16 DIAGNOSIS — C787 Secondary malignant neoplasm of liver and intrahepatic bile duct: Secondary | ICD-10-CM | POA: Diagnosis not present

## 2017-01-16 DIAGNOSIS — C3490 Malignant neoplasm of unspecified part of unspecified bronchus or lung: Secondary | ICD-10-CM | POA: Diagnosis not present

## 2017-01-17 DIAGNOSIS — I469 Cardiac arrest, cause unspecified: Secondary | ICD-10-CM | POA: Diagnosis not present

## 2017-01-20 ENCOUNTER — Ambulatory Visit (HOSPITAL_COMMUNITY): Payer: Medicare Other

## 2017-01-23 ENCOUNTER — Ambulatory Visit: Payer: Medicare Other | Admitting: Internal Medicine

## 2017-01-23 ENCOUNTER — Other Ambulatory Visit: Payer: Medicare Other

## 2017-01-23 ENCOUNTER — Ambulatory Visit: Payer: Medicare Other

## 2017-01-23 NOTE — Telephone Encounter (Signed)
Hospice RN called to advised MD pt passed today.

## 2017-01-23 NOTE — Telephone Encounter (Signed)
[facility] called to inform me of pt's passing this morning. Will await death certificate.  Spoke with wife, expressed my condolences.  Will cc Dr [name] as Juluis Rainier.

## 2017-01-23 DEATH — deceased

## 2017-01-30 ENCOUNTER — Ambulatory Visit: Payer: Medicare Other

## 2017-01-30 ENCOUNTER — Other Ambulatory Visit: Payer: Medicare Other

## 2017-01-30 ENCOUNTER — Ambulatory Visit: Payer: Medicare Other | Admitting: Internal Medicine

## 2017-02-13 ENCOUNTER — Ambulatory Visit: Payer: Medicare Other | Admitting: Internal Medicine

## 2017-02-13 ENCOUNTER — Other Ambulatory Visit: Payer: Medicare Other

## 2017-02-13 ENCOUNTER — Ambulatory Visit: Payer: Medicare Other

## 2017-02-19 ENCOUNTER — Ambulatory Visit (INDEPENDENT_AMBULATORY_CARE_PROVIDER_SITE_OTHER): Payer: Medicare Other | Admitting: Ophthalmology

## 2017-02-19 ENCOUNTER — Ambulatory Visit: Payer: Medicare Other | Admitting: Podiatry

## 2017-02-20 ENCOUNTER — Ambulatory Visit: Payer: Medicare Other

## 2017-02-20 ENCOUNTER — Ambulatory Visit: Payer: Medicare Other | Admitting: Internal Medicine

## 2017-02-20 ENCOUNTER — Other Ambulatory Visit: Payer: Medicare Other

## 2017-02-26 ENCOUNTER — Encounter: Payer: Medicare Other | Admitting: Cardiothoracic Surgery

## 2017-03-05 ENCOUNTER — Encounter: Payer: Medicare Other | Admitting: Cardiothoracic Surgery

## 2017-03-12 ENCOUNTER — Ambulatory Visit: Payer: Medicare Other

## 2017-03-13 ENCOUNTER — Ambulatory Visit: Payer: Medicare Other | Admitting: Internal Medicine

## 2017-03-13 ENCOUNTER — Other Ambulatory Visit: Payer: Medicare Other

## 2017-03-13 ENCOUNTER — Ambulatory Visit: Payer: Medicare Other

## 2017-03-19 ENCOUNTER — Ambulatory Visit: Payer: Medicare Other | Admitting: Family Medicine

## 2017-04-03 ENCOUNTER — Other Ambulatory Visit: Payer: Medicare Other

## 2017-04-03 ENCOUNTER — Ambulatory Visit: Payer: Medicare Other | Admitting: Internal Medicine

## 2017-04-03 ENCOUNTER — Ambulatory Visit: Payer: Medicare Other

## 2017-04-04 ENCOUNTER — Other Ambulatory Visit: Payer: Self-pay | Admitting: Nurse Practitioner

## 2017-04-24 ENCOUNTER — Ambulatory Visit: Payer: Medicare Other | Admitting: Internal Medicine

## 2017-04-24 ENCOUNTER — Ambulatory Visit: Payer: Medicare Other

## 2017-04-24 ENCOUNTER — Other Ambulatory Visit: Payer: Medicare Other

## 2018-01-06 IMAGING — XA IR ANGIO/ADD [PERSON_NAME]
12 of 19 series · 12 of 24 positions shown · IV contrast (IODINE)
Comparison: none

INDICATION: 75-year-old male with multifocal hepatocellular carcinoma. Pre [AGE]
arterial mapping and lung shunt fraction calculation.

[Series 1: care body 4 · 1 of 67 frames shown (1 of 11)]
[frame 57/67]
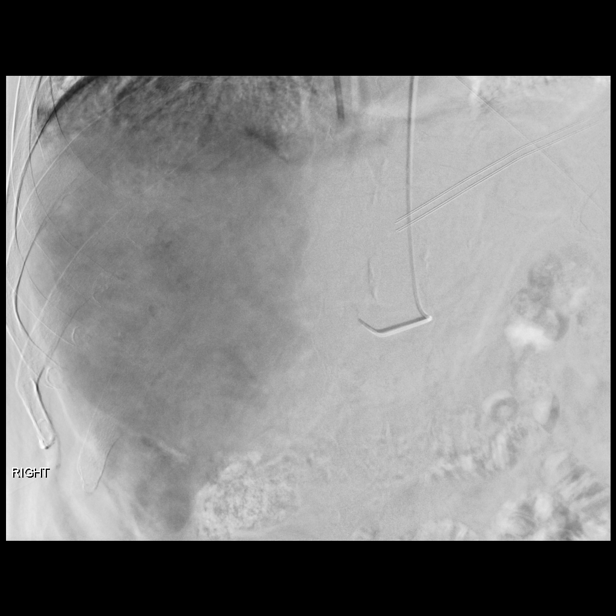

[Series 3: care body 4 · 1 of 64 frames shown (2 of 11)]
[frame 55/64]
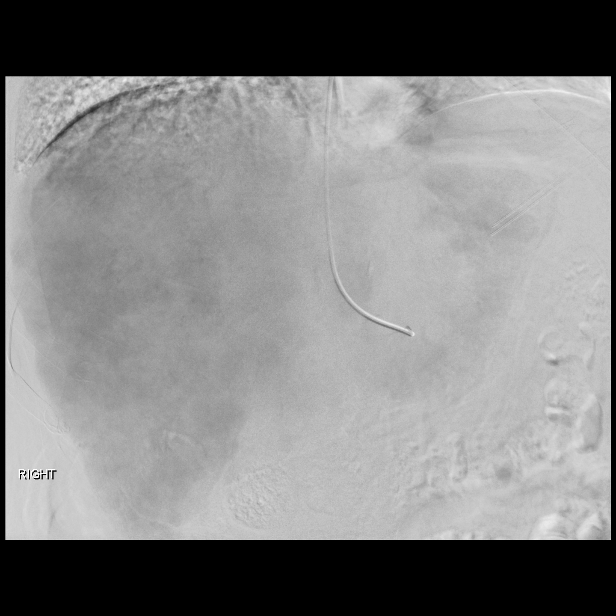

[Series 5: care body 4 · 1 of 41 frames shown (3 of 11)]
[frame 7/41]
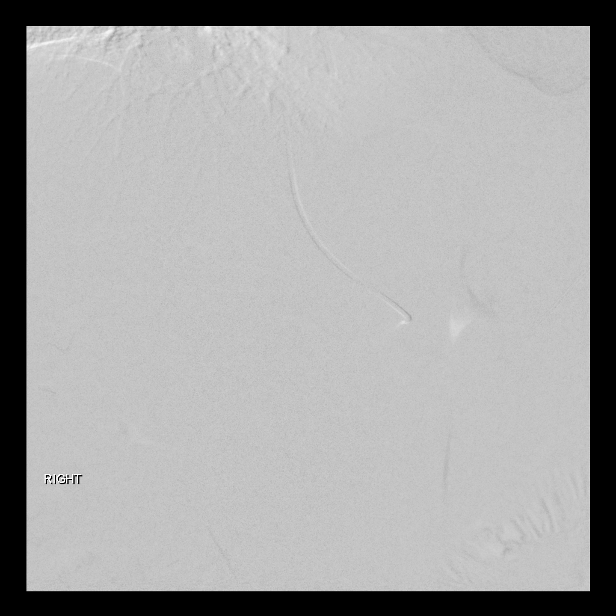

[Series 6: care body 4 · 1 of 18 frames shown (4 of 11)]
[frame 16/18]
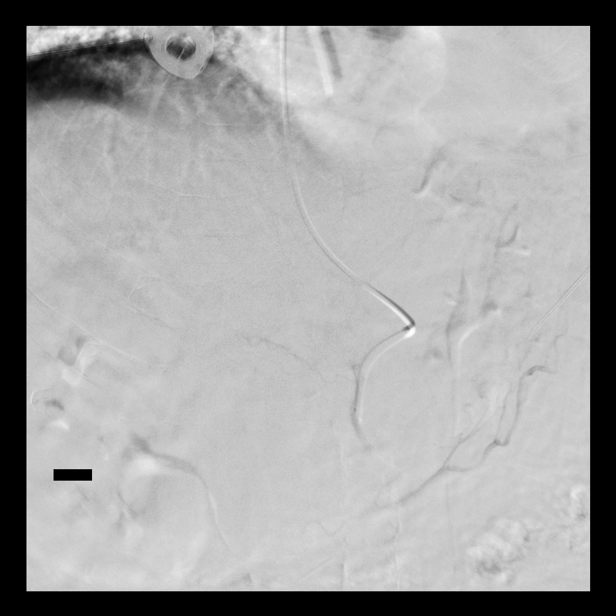

[Series 8: care body 4 · 1 of 19 frames shown (5 of 11)]
[frame 10/19]
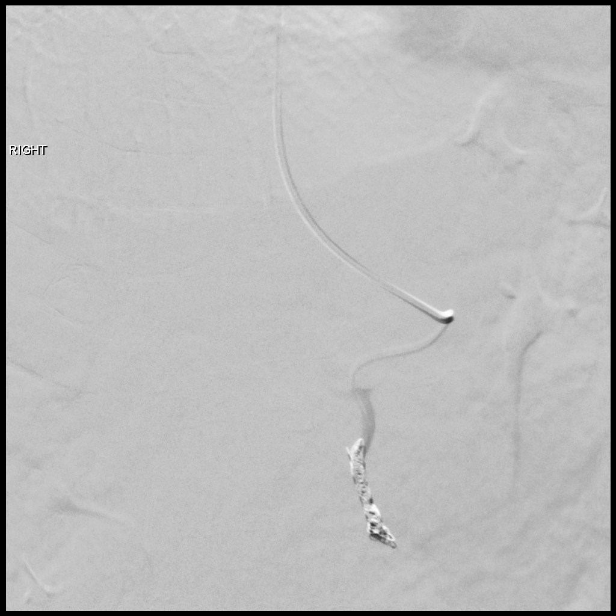

[Series 10: care body 4 · 1 of 15 frames shown (6 of 11)]
[frame 1/15]
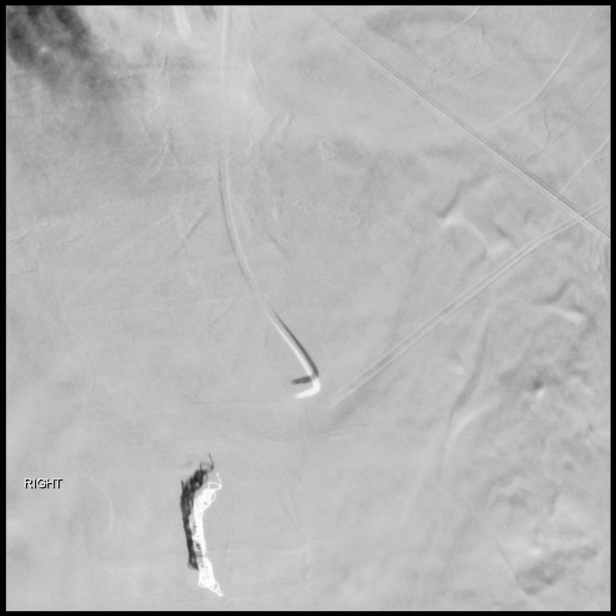

[Series 11: care body 4 · 1 of 15 frames shown (7 of 11)]
[frame 13/15]
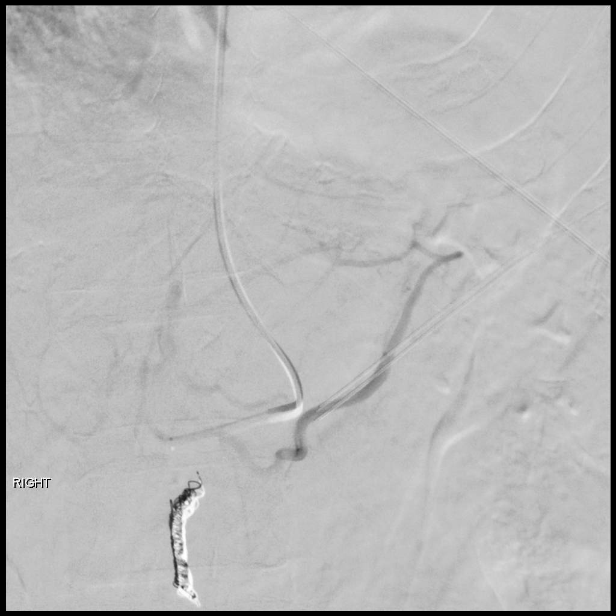

[Series 13: care body 4 · 1 of 31 frames shown (8 of 11)]
[frame 16/31]
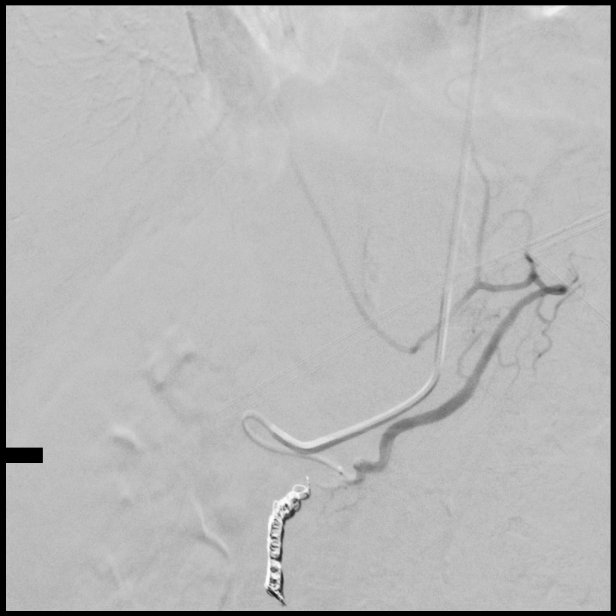

[Series 14: care body 4 · 1 of 19 frames shown (9 of 11)]
[frame 19/19]
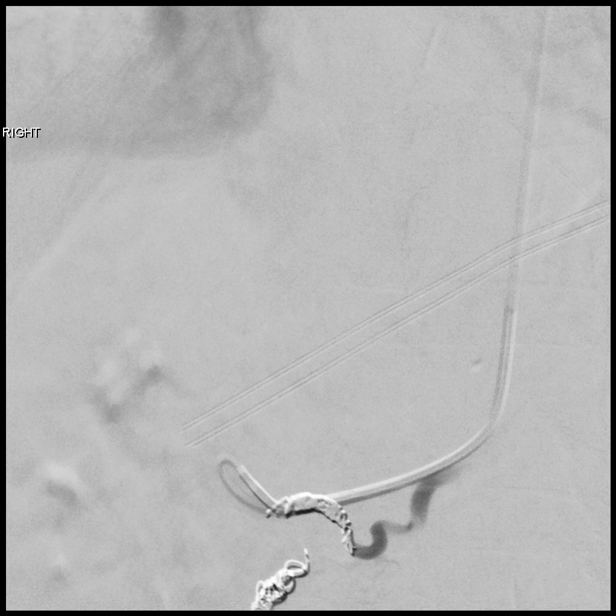

[Series 16: care body 4 · 1 of 19 frames shown (10 of 11)]
[frame 12/19]
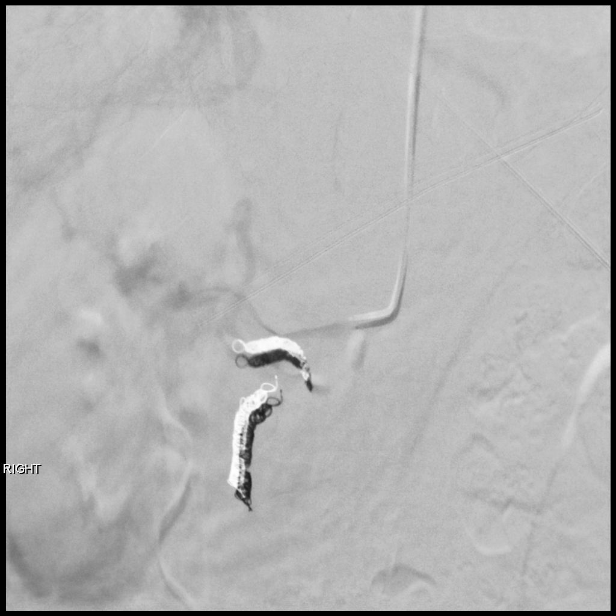

[Series 18: care body 4 · 1 of 61 frames shown (11 of 11)]
[frame 10/61]
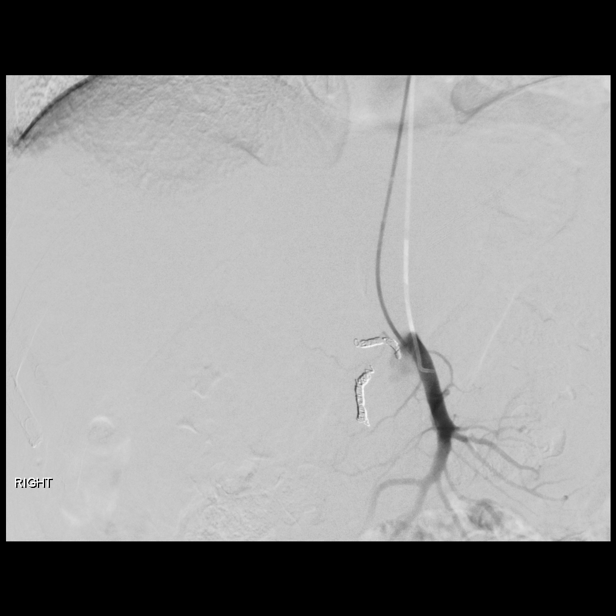

[Series 100: ld dsa body · 1 of 4 slices shown]
[im 4/4]
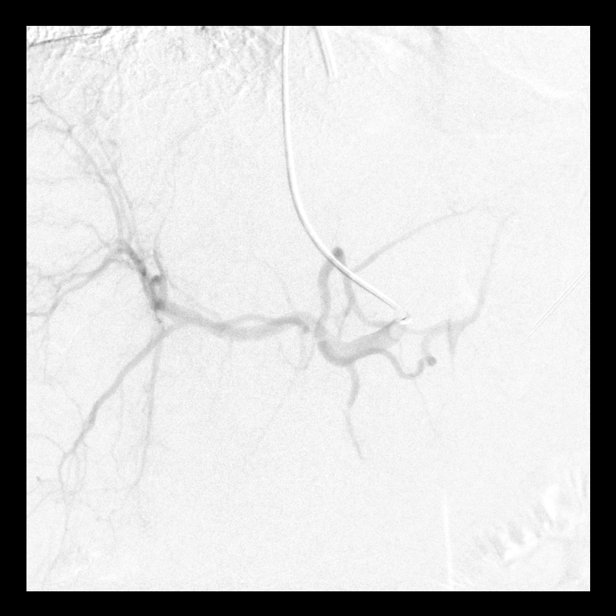

[12 of 24 positions shown; findings below may reference images not displayed]

EXAM:
IR EMBO ARTERIAL NOT HEMORR SIMING INC GUIDE ROADMAPPING; IR
ULTRASOUND GUIDANCE VASC ACCESS RIGHT; ADDITIONAL ARTERIOGRAPHY;
SELECTIVE VISCERAL ARTERIOGRAPHY

1. Ultrasound-guided puncture left radial artery
2. Celiac catheterization and arteriogram
3. Common hepatic catheterization and arteriogram
4. Gastroduodenal artery catheterization and arteriogram
5. Coil embolization gastroduodenal artery
6. Right gastric artery catheterization and arteriogram
7. Coil embolization right gastric artery
8. Left hepatic artery catheterization and arteriogram
9. Right hepatic artery catheterization an arteriogram
10. Instillation technetium 99 MAA
11. SMA catheterization an arteriogram

MEDICATIONS:
3.375 g Zosyn. The antibiotic was administered within 1 hour of the
procedure

ANESTHESIA/SEDATION:
Moderate (conscious) sedation was employed during this procedure. A
total of Versed 5 mg and Fentanyl 100 mcg was administered
intravenously.

Moderate Sedation Time: 80 minutes. The patient's level of
consciousness and vital signs were monitored continuously by
radiology nursing throughout the procedure under my direct
supervision.

CONTRAST:  80mL [id number] IOPAMIDOL ([id number]) INJECTION 61%

FLUOROSCOPY TIME:  Fluoroscopy Time: 17 minutes 6 seconds (5247
mGy).

COMPLICATIONS:
None immediate.



Ultrasound was used to interrogate the wrist. The radial artery is
widely patent. An image was saved for the electronic medical record.
The skin surface was anesthetized with 1% lidocaine. Then, under
real-time ultrasound guidance the radial artery was punctured using
a 21 gauge micropuncture needle. A 0.018 wire was advanced into the
radial artery in the needle exchanged for a 5 French slender
hydrophilic sheath. The sheath was flushed. A cocktail consisting of
2333 units heparin, 200 mcg nitroglycerin and 2.5 mg verapamil was
then instilled into the artery.

A 5 French angled catheter was advanced over a Bentson wire into the
descending thoracic aorta. The Bentson wire was exchanged for a
Glidewire. The catheter was used to select the celiac artery. A
celiac arteriogram was performed. There is conventional hepatic
arterial anatomy. Significant tumor blush is present throughout the
right hepatic artery. The catheter was then advanced into the common
hepatic artery. An arteriogram was performed. The gastroduodenal
artery is easily visible. There is a robust right gastric artery.

A renegade ST microcatheter was then advanced coaxially through the
5 French catheter in used to select the gastroduodenal artery. An
arteriogram was performed confirming the anatomy. Coil embolization
was then performed using a series of [employer] scientific interlock
detachable microcoils. Successful coil embolization was confirmed by
a post embolization arteriogram. The microcatheter was next advanced
into the origin of the left hepatic artery. An arteriogram was
performed. The robust right gastric artery arises from the proximal
aspect of the left hepatic artery. The microcatheter was
successfully navigated into this artery. Of note, 1 of the inferior
phrenic arteries arises from the left gastric artery. Coil
embolization was successfully performed again using a combination of
interlock detachable coils. Successful occlusion was confirmed by
post embolization arteriography. Finally, the microcatheter was
advanced into the right hepatic artery and an arteriogram was
performed. Normal right hepatic arterial anatomy. No aberrant
vessels. 1 millicurie of technetium 99 MAA was injected into the
right hepatic artery.

The microcatheter was removed and discarded. The 5 French catheter
was advanced into the superior mesenteric artery. A superior
mesenteric arteriogram was performed. There is no evidence of
accessory right hepatic artery. The main portal vein is patent. The
catheter was removed. Hemostasis was attained with the use of a TR
band.
IMPRESSION: 1. Successful pre [AGE] mapping study.
2. Coil embolization of the right hepatic and gastroduodenal
arteries.
3. Instillation of technetium 99 MAA into the right hepatic artery.
